# Patient Record
Sex: Male | Born: 1945
Health system: Southern US, Community
[De-identification: ages and names within clinical notes are randomized; demographics above are authoritative.]

## PROBLEM LIST (undated history)

## (undated) DIAGNOSIS — J449 Chronic obstructive pulmonary disease, unspecified: Secondary | ICD-10-CM

## (undated) DIAGNOSIS — I10 Essential (primary) hypertension: Secondary | ICD-10-CM

## (undated) DIAGNOSIS — R7989 Other specified abnormal findings of blood chemistry: Secondary | ICD-10-CM

## (undated) DIAGNOSIS — R079 Chest pain, unspecified: Secondary | ICD-10-CM

## (undated) DIAGNOSIS — J45909 Unspecified asthma, uncomplicated: Secondary | ICD-10-CM

## (undated) HISTORY — DX: Chest pain, unspecified: R07.9

## (undated) HISTORY — PX: BACK SURGERY: SHX140

## (undated) HISTORY — PX: LUNG REMOVAL, PARTIAL: SHX233

## (undated) HISTORY — PX: TONSILLECTOMY: SUR1361

---

## 1998-12-04 ENCOUNTER — Ambulatory Visit (HOSPITAL_COMMUNITY): Admission: RE | Admit: 1998-12-04 | Discharge: 1998-12-04 | Payer: Self-pay | Admitting: Family Medicine

## 2000-03-31 ENCOUNTER — Emergency Department (HOSPITAL_COMMUNITY): Admission: EM | Admit: 2000-03-31 | Discharge: 2000-03-31 | Payer: Self-pay | Admitting: Emergency Medicine

## 2002-07-29 ENCOUNTER — Ambulatory Visit (HOSPITAL_COMMUNITY): Admission: RE | Admit: 2002-07-29 | Discharge: 2002-07-29 | Payer: Self-pay | Admitting: Internal Medicine

## 2002-07-29 ENCOUNTER — Encounter: Payer: Self-pay | Admitting: Internal Medicine

## 2006-01-09 ENCOUNTER — Emergency Department (HOSPITAL_COMMUNITY): Admission: EM | Admit: 2006-01-09 | Discharge: 2006-01-09 | Payer: Self-pay | Admitting: Family Medicine

## 2006-07-04 ENCOUNTER — Ambulatory Visit: Payer: Self-pay | Admitting: Internal Medicine

## 2006-07-07 ENCOUNTER — Ambulatory Visit: Payer: Self-pay | Admitting: Internal Medicine

## 2006-07-17 ENCOUNTER — Ambulatory Visit: Payer: Self-pay | Admitting: Cardiology

## 2006-07-24 ENCOUNTER — Ambulatory Visit: Payer: Self-pay | Admitting: Internal Medicine

## 2006-08-15 ENCOUNTER — Ambulatory Visit: Payer: Self-pay | Admitting: Internal Medicine

## 2007-09-01 ENCOUNTER — Encounter: Payer: Self-pay | Admitting: *Deleted

## 2007-09-01 DIAGNOSIS — F1910 Other psychoactive substance abuse, uncomplicated: Secondary | ICD-10-CM | POA: Insufficient documentation

## 2007-09-01 DIAGNOSIS — F329 Major depressive disorder, single episode, unspecified: Secondary | ICD-10-CM

## 2007-09-01 DIAGNOSIS — F17200 Nicotine dependence, unspecified, uncomplicated: Secondary | ICD-10-CM

## 2007-09-01 DIAGNOSIS — F32A Depression, unspecified: Secondary | ICD-10-CM | POA: Insufficient documentation

## 2007-09-01 DIAGNOSIS — I10 Essential (primary) hypertension: Secondary | ICD-10-CM | POA: Insufficient documentation

## 2007-09-01 DIAGNOSIS — Z9089 Acquired absence of other organs: Secondary | ICD-10-CM | POA: Insufficient documentation

## 2007-09-01 HISTORY — DX: Depression, unspecified: F32.A

## 2007-09-01 HISTORY — DX: Nicotine dependence, unspecified, uncomplicated: F17.200

## 2016-06-13 ENCOUNTER — Emergency Department (HOSPITAL_COMMUNITY): Payer: 59

## 2016-06-13 ENCOUNTER — Encounter (HOSPITAL_COMMUNITY): Payer: Self-pay | Admitting: Emergency Medicine

## 2016-06-13 ENCOUNTER — Inpatient Hospital Stay (HOSPITAL_COMMUNITY)
Admission: EM | Disposition: A | Discharge status: Home / Self Care | Payer: Self-pay | Source: Home / Self Care | Attending: Internal Medicine

## 2016-06-13 ENCOUNTER — Inpatient Hospital Stay (HOSPITAL_COMMUNITY)
Admission: EM | Admit: 2016-06-13 | Discharge: 2016-06-16 | DRG: 286 | Disposition: A | Discharge status: Home / Self Care | Payer: 59 | Attending: Internal Medicine | Admitting: Internal Medicine

## 2016-06-13 DIAGNOSIS — I25810 Atherosclerosis of coronary artery bypass graft(s) without angina pectoris: Secondary | ICD-10-CM

## 2016-06-13 DIAGNOSIS — I214 Non-ST elevation (NSTEMI) myocardial infarction: Secondary | ICD-10-CM

## 2016-06-13 DIAGNOSIS — R079 Chest pain, unspecified: Secondary | ICD-10-CM | POA: Diagnosis not present

## 2016-06-13 DIAGNOSIS — R531 Weakness: Secondary | ICD-10-CM | POA: Diagnosis not present

## 2016-06-13 DIAGNOSIS — F1121 Opioid dependence, in remission: Secondary | ICD-10-CM | POA: Diagnosis not present

## 2016-06-13 DIAGNOSIS — I5181 Takotsubo syndrome: Principal | ICD-10-CM | POA: Diagnosis present

## 2016-06-13 DIAGNOSIS — Z79899 Other long term (current) drug therapy: Secondary | ICD-10-CM | POA: Diagnosis not present

## 2016-06-13 DIAGNOSIS — J189 Pneumonia, unspecified organism: Secondary | ICD-10-CM | POA: Diagnosis present

## 2016-06-13 DIAGNOSIS — Z8701 Personal history of pneumonia (recurrent): Secondary | ICD-10-CM

## 2016-06-13 DIAGNOSIS — I2510 Atherosclerotic heart disease of native coronary artery without angina pectoris: Secondary | ICD-10-CM

## 2016-06-13 DIAGNOSIS — I213 ST elevation (STEMI) myocardial infarction of unspecified site: Secondary | ICD-10-CM

## 2016-06-13 DIAGNOSIS — J440 Chronic obstructive pulmonary disease with acute lower respiratory infection: Secondary | ICD-10-CM | POA: Diagnosis present

## 2016-06-13 DIAGNOSIS — R778 Other specified abnormalities of plasma proteins: Secondary | ICD-10-CM

## 2016-06-13 DIAGNOSIS — F17210 Nicotine dependence, cigarettes, uncomplicated: Secondary | ICD-10-CM | POA: Diagnosis not present

## 2016-06-13 DIAGNOSIS — F1120 Opioid dependence, uncomplicated: Secondary | ICD-10-CM | POA: Diagnosis present

## 2016-06-13 DIAGNOSIS — Z8249 Family history of ischemic heart disease and other diseases of the circulatory system: Secondary | ICD-10-CM

## 2016-06-13 DIAGNOSIS — I5023 Acute on chronic systolic (congestive) heart failure: Secondary | ICD-10-CM | POA: Diagnosis present

## 2016-06-13 DIAGNOSIS — R7989 Other specified abnormal findings of blood chemistry: Secondary | ICD-10-CM | POA: Diagnosis present

## 2016-06-13 DIAGNOSIS — Z23 Encounter for immunization: Secondary | ICD-10-CM

## 2016-06-13 HISTORY — PX: CARDIAC CATHETERIZATION: SHX172

## 2016-06-13 HISTORY — DX: Other specified abnormal findings of blood chemistry: R79.89

## 2016-06-13 HISTORY — DX: Takotsubo syndrome: I51.81

## 2016-06-13 HISTORY — DX: Chronic obstructive pulmonary disease, unspecified: J44.9

## 2016-06-13 HISTORY — DX: Other specified abnormalities of plasma proteins: R77.8

## 2016-06-13 HISTORY — DX: Atherosclerosis of coronary artery bypass graft(s) without angina pectoris: I25.810

## 2016-06-13 LAB — STREP PNEUMONIAE URINARY ANTIGEN: Strep Pneumo Urinary Antigen: NEGATIVE

## 2016-06-13 LAB — MRSA PCR SCREENING: MRSA BY PCR: NEGATIVE

## 2016-06-13 LAB — BASIC METABOLIC PANEL
Anion gap: 11 (ref 5–15)
BUN: 11 mg/dL (ref 6–20)
CO2: 23 mmol/L (ref 22–32)
Calcium: 8.6 mg/dL — ABNORMAL LOW (ref 8.9–10.3)
Chloride: 102 mmol/L (ref 101–111)
Creatinine, Ser: 0.84 mg/dL (ref 0.61–1.24)
GFR calc Af Amer: >60 mL/min (ref >60)
GFR calc non Af Amer: >60 mL/min (ref >60)
Glucose, Bld: 141 mg/dL — ABNORMAL HIGH (ref 65–99)
Potassium: 3.9 mmol/L (ref 3.5–5.1)
Sodium: 136 mmol/L (ref 135–145)

## 2016-06-13 LAB — CBC WITH DIFFERENTIAL/PLATELET
Basophils Absolute: 0 10*3/uL (ref 0.0–0.1)
Basophils Relative: 0 %
Eosinophils Absolute: 0.1 10*3/uL (ref 0.0–0.7)
Eosinophils Relative: 1 %
HCT: 43.4 % (ref 39.0–52.0)
Hemoglobin: 14.2 g/dL (ref 13.0–17.0)
Lymphocytes Relative: 5 %
Lymphs Abs: 0.5 10*3/uL — ABNORMAL LOW (ref 0.7–4.0)
MCH: 30.9 pg (ref 26.0–34.0)
MCHC: 32.7 g/dL (ref 30.0–36.0)
MCV: 94.3 fL (ref 78.0–100.0)
Monocytes Absolute: 0.6 10*3/uL (ref 0.1–1.0)
Monocytes Relative: 6 %
Neutro Abs: 9.1 10*3/uL — ABNORMAL HIGH (ref 1.7–7.7)
Neutrophils Relative %: 88 %
Platelets: 217 10*3/uL (ref 150–400)
RBC: 4.6 MIL/uL (ref 4.22–5.81)
RDW: 13.8 % (ref 11.5–15.5)
WBC: 10.3 10*3/uL (ref 4.0–10.5)

## 2016-06-13 LAB — I-STAT TROPONIN, ED: TROPONIN I, POC: 0.24 ng/mL — AB (ref 0.00–0.08)

## 2016-06-13 SURGERY — LEFT HEART CATH AND CORONARY ANGIOGRAPHY
Anesthesia: LOCAL

## 2016-06-13 MED ORDER — ENOXAPARIN SODIUM 40 MG/0.4ML ~~LOC~~ SOLN
40.0000 mg | SUBCUTANEOUS | Status: DC
  Administered 2016-06-14 – 2016-06-16 (×3): 40 mg via SUBCUTANEOUS
  Filled 2016-06-13 (×3): qty 0.4

## 2016-06-13 MED ORDER — LISINOPRIL 2.5 MG PO TABS
2.5000 mg | ORAL_TABLET | Freq: Every day | ORAL | Status: DC
  Administered 2016-06-13 – 2016-06-16 (×4): 2.5 mg via ORAL
  Filled 2016-06-13 (×4): qty 1

## 2016-06-13 MED ORDER — CETYLPYRIDINIUM CHLORIDE 0.05 % MT LIQD
7.0000 mL | Freq: Two times a day (BID) | OROMUCOSAL | Status: DC
  Administered 2016-06-13 – 2016-06-14 (×3): 7 mL via OROMUCOSAL

## 2016-06-13 MED ORDER — METHADONE HCL 5 MG PO TABS
14.0000 mg | ORAL_TABLET | Freq: Every day | ORAL | Status: DC
  Administered 2016-06-13 – 2016-06-16 (×4): 15 mg via ORAL
  Filled 2016-06-13 (×4): qty 3

## 2016-06-13 MED ORDER — ATORVASTATIN CALCIUM 40 MG PO TABS
40.0000 mg | ORAL_TABLET | Freq: Every day | ORAL | Status: DC
  Administered 2016-06-13 – 2016-06-15 (×2): 40 mg via ORAL
  Filled 2016-06-13 (×2): qty 1

## 2016-06-13 MED ORDER — DEXTROSE 5 % IV SOLN
1.0000 g | Freq: Once | INTRAVENOUS | Status: AC
  Administered 2016-06-13: 1 g via INTRAVENOUS
  Filled 2016-06-13 (×2): qty 10

## 2016-06-13 MED ORDER — HEPARIN (PORCINE) IN NACL 2-0.9 UNIT/ML-% IJ SOLN
INTRAMUSCULAR | Status: DC | PRN
  Administered 2016-06-13: 1000 mL

## 2016-06-13 MED ORDER — IOPAMIDOL (ISOVUE-370) INJECTION 76%
INTRAVENOUS | Status: AC
  Filled 2016-06-13: qty 100

## 2016-06-13 MED ORDER — DEXTROSE 5 % IV SOLN
500.0000 mg | INTRAVENOUS | Status: DC
  Administered 2016-06-14: 500 mg via INTRAVENOUS
  Filled 2016-06-13 (×2): qty 500

## 2016-06-13 MED ORDER — SODIUM CHLORIDE 0.9 % IV SOLN
INTRAVENOUS | Status: DC | PRN
  Administered 2016-06-13: 75 mL/h via INTRAVENOUS

## 2016-06-13 MED ORDER — SODIUM CHLORIDE 0.9% FLUSH: 3.0000 mL | INTRAVENOUS | Status: DC | PRN

## 2016-06-13 MED ORDER — SODIUM CHLORIDE 0.9 % IV SOLN: 250.0000 mL | INTRAVENOUS | Status: DC | PRN

## 2016-06-13 MED ORDER — VERAPAMIL HCL 2.5 MG/ML IV SOLN
INTRAVENOUS | Status: AC
  Filled 2016-06-13: qty 2

## 2016-06-13 MED ORDER — VERAPAMIL HCL 2.5 MG/ML IV SOLN
INTRAVENOUS | Status: DC | PRN
  Administered 2016-06-13: 12:00:00 via INTRA_ARTERIAL

## 2016-06-13 MED ORDER — BUPROPION HCL ER (XL) 300 MG PO TB24
300.0000 mg | ORAL_TABLET | Freq: Every day | ORAL | Status: DC
  Administered 2016-06-13 – 2016-06-16 (×4): 300 mg via ORAL
  Filled 2016-06-13 (×4): qty 1

## 2016-06-13 MED ORDER — OXYCODONE-ACETAMINOPHEN 5-325 MG PO TABS
1.0000 | ORAL_TABLET | Freq: Once | ORAL | Status: AC
  Administered 2016-06-13: 1 via ORAL
  Filled 2016-06-13: qty 1

## 2016-06-13 MED ORDER — IOPAMIDOL (ISOVUE-370) INJECTION 76%
INTRAVENOUS | Status: DC | PRN
  Administered 2016-06-13: 80 mL via INTRAVENOUS

## 2016-06-13 MED ORDER — ONDANSETRON HCL 4 MG/2ML IJ SOLN: 4.0000 mg | Freq: Four times a day (QID) | INTRAMUSCULAR | Status: DC | PRN

## 2016-06-13 MED ORDER — ACETAMINOPHEN 325 MG PO TABS: 650.0000 mg | ORAL_TABLET | ORAL | Status: DC | PRN

## 2016-06-13 MED ORDER — PNEUMOCOCCAL VAC POLYVALENT 25 MCG/0.5ML IJ INJ
0.5000 mL | INJECTION | INTRAMUSCULAR | Status: AC
  Administered 2016-06-14: 0.5 mL via INTRAMUSCULAR
  Filled 2016-06-13: qty 0.5

## 2016-06-13 MED ORDER — MIDAZOLAM HCL 2 MG/2ML IJ SOLN
INTRAMUSCULAR | Status: AC
  Filled 2016-06-13: qty 2

## 2016-06-13 MED ORDER — FENTANYL CITRATE (PF) 100 MCG/2ML IJ SOLN
INTRAMUSCULAR | Status: AC
  Filled 2016-06-13: qty 2

## 2016-06-13 MED ORDER — MIDAZOLAM HCL 2 MG/2ML IJ SOLN
INTRAMUSCULAR | Status: DC | PRN
  Administered 2016-06-13: 1 mg via INTRAVENOUS

## 2016-06-13 MED ORDER — SODIUM CHLORIDE 0.9 % IV SOLN: INTRAVENOUS | Status: AC

## 2016-06-13 MED ORDER — HEPARIN SODIUM (PORCINE) 5000 UNIT/ML IJ SOLN: 4000.0000 [IU] | Freq: Once | INTRAMUSCULAR | Status: DC

## 2016-06-13 MED ORDER — LIDOCAINE HCL (PF) 1 % IJ SOLN
INTRAMUSCULAR | Status: AC
  Filled 2016-06-13: qty 30

## 2016-06-13 MED ORDER — NITROGLYCERIN 1 MG/10 ML FOR IR/CATH LAB
INTRA_ARTERIAL | Status: AC
  Filled 2016-06-13: qty 10

## 2016-06-13 MED ORDER — CITALOPRAM HYDROBROMIDE 20 MG PO TABS
40.0000 mg | ORAL_TABLET | Freq: Every day | ORAL | Status: DC
  Administered 2016-06-13 – 2016-06-16 (×4): 40 mg via ORAL
  Filled 2016-06-13 (×4): qty 2

## 2016-06-13 MED ORDER — METOPROLOL SUCCINATE ER 25 MG PO TB24
12.5000 mg | ORAL_TABLET | Freq: Every day | ORAL | Status: DC
  Administered 2016-06-13 – 2016-06-16 (×4): 12.5 mg via ORAL
  Filled 2016-06-13 (×4): qty 1

## 2016-06-13 MED ORDER — ASPIRIN 81 MG PO CHEW
81.0000 mg | CHEWABLE_TABLET | Freq: Every day | ORAL | Status: DC
  Administered 2016-06-14 – 2016-06-16 (×3): 81 mg via ORAL
  Filled 2016-06-13 (×3): qty 1

## 2016-06-13 MED ORDER — DEXTROSE 5 % IV SOLN
500.0000 mg | Freq: Once | INTRAVENOUS | Status: AC
  Administered 2016-06-13: 500 mg via INTRAVENOUS
  Filled 2016-06-13: qty 500

## 2016-06-13 MED ORDER — MORPHINE SULFATE (PF) 2 MG/ML IV SOLN
2.0000 mg | Freq: Once | INTRAVENOUS | Status: AC
  Administered 2016-06-13: 2 mg via INTRAVENOUS
  Filled 2016-06-13: qty 1

## 2016-06-13 MED ORDER — HEPARIN SODIUM (PORCINE) 5000 UNIT/ML IJ SOLN
INTRAMUSCULAR | Status: AC
  Administered 2016-06-13: 4000 [IU]
  Filled 2016-06-13: qty 1

## 2016-06-13 MED ORDER — DEXTROSE 5 % IV SOLN: 500.0000 mg | INTRAVENOUS | Status: DC

## 2016-06-13 MED ORDER — LIDOCAINE HCL (PF) 1 % IJ SOLN
INTRAMUSCULAR | Status: DC | PRN
  Administered 2016-06-13: 2 mL

## 2016-06-13 MED ORDER — FENTANYL CITRATE (PF) 100 MCG/2ML IJ SOLN
INTRAMUSCULAR | Status: DC | PRN
  Administered 2016-06-13: 50 ug via INTRAVENOUS

## 2016-06-13 MED ORDER — HEPARIN SODIUM (PORCINE) 1000 UNIT/ML IJ SOLN
INTRAMUSCULAR | Status: AC
  Filled 2016-06-13: qty 1

## 2016-06-13 MED ORDER — HEPARIN (PORCINE) IN NACL 2-0.9 UNIT/ML-% IJ SOLN
INTRAMUSCULAR | Status: AC
  Filled 2016-06-13: qty 1000

## 2016-06-13 MED ORDER — SODIUM CHLORIDE 0.9% FLUSH
3.0000 mL | Freq: Two times a day (BID) | INTRAVENOUS | Status: DC
  Administered 2016-06-13 – 2016-06-15 (×5): 3 mL via INTRAVENOUS

## 2016-06-13 MED ORDER — DEXTROSE 5 % IV SOLN: 1.0000 g | INTRAVENOUS | Status: DC

## 2016-06-13 MED ORDER — DEXTROSE 5 % IV SOLN
1.0000 g | INTRAVENOUS | Status: DC
  Administered 2016-06-14 – 2016-06-15 (×2): 1 g via INTRAVENOUS
  Filled 2016-06-13 (×3): qty 10

## 2016-06-13 MED ORDER — NITROGLYCERIN IN D5W 200-5 MCG/ML-% IV SOLN
INTRAVENOUS | Status: AC
  Filled 2016-06-13: qty 250

## 2016-06-13 MED ORDER — ALBUTEROL SULFATE (2.5 MG/3ML) 0.083% IN NEBU: 2.5000 mg | INHALATION_SOLUTION | RESPIRATORY_TRACT | Status: DC | PRN

## 2016-06-13 SURGICAL SUPPLY — 13 items
CATH INFINITI 5FR ANG PIGTAIL (CATHETERS) ×2 IMPLANT
CATH INFINITI JR4 5F (CATHETERS) ×1 IMPLANT
CATH VISTA GUIDE 6FR XB3 (CATHETERS) ×2 IMPLANT
DEVICE RAD COMP TR BAND LRG (VASCULAR PRODUCTS) ×1 IMPLANT
ELECT DEFIB PAD ADLT CADENCE (PAD) ×2 IMPLANT
GLIDESHEATH SLEND SS 6F .021 (SHEATH) ×2 IMPLANT
KIT ENCORE 26 ADVANTAGE (KITS) ×2 IMPLANT
KIT HEART LEFT (KITS) ×2 IMPLANT
PACK CARDIAC CATHETERIZATION (CUSTOM PROCEDURE TRAY) ×2 IMPLANT
SYR MEDRAD MARK V 150ML (SYRINGE) ×2 IMPLANT
TRANSDUCER W/STOPCOCK (MISCELLANEOUS) ×2 IMPLANT
TUBING CIL FLEX 10 FLL-RA (TUBING) ×2 IMPLANT
WIRE SAFE-T 1.5MM-J .035X260CM (WIRE) ×1 IMPLANT

## 2016-06-13 NOTE — Consult Note (Signed)
[name], MD Physician Signed Cardiology  H&P Date of Service: 06/13/2016 10:55 AM    Expand All Collapse All   '[]'$ Hide copied text '[]'$ Hover for attribution information    Cardiology Consult    Patient ID: Gary Wright MRN: Wright, DOB/AGE: 05-28-1946   Admit date: 06/13/2016 Date of Consult: 06/13/2016  Primary Physician: No primary care provider on file. Reason for Consult: chest pain Primary Cardiologist: New Requesting Provider: Dr. Lita Mains  Patient Profile  Gary Wright is a 70 year old male with no previous cardiac history. He has a history of COPD and narcotic abuse (on Methadone).   History of Present Illness  Gary Wright presents to the ED today with dyspnea and chest pressure. He woke up this morning around 5 am and experienced extreme dyspnea. He could not catch his breath for a long while. He also developed substernal chest pressure that radiated to both arms. He called EMS, who "checked him out". He was offered transport to the hospital but declined. He then went to the methadone clinic and continued to experience chest pressure and SOB. His wife transported him to the ED.   His EKG on arrival showed inferior ST depression and slight anterolateral elevation, however subsequent EKG's showed worsening elevation in V2-V3. He continued to have chest pain, SOB and developed diaphoresis at the time of my encounter. His initial troponin was elevated at 0.24.   He denies a history of MI. He says that he has been having intermittent chest pain for a few months that would wax and waned in severity, however he says that he has never had pain as intense as he is experiencing today.   Also of note, he is febrile on arrival with temp of 100.7 orally. He reports having a cough for 3-4 days that is productive of clear sputum although he has not coughed up anything today. He has not sought treatment for this.   Past Medical History       Past Medical History:    Diagnosis Date  . COPD (chronic obstructive pulmonary disease) ([place])          Past Surgical History:  Procedure Laterality Date  . TONSILLECTOMY       Allergies  No Known Allergies  Inpatient Medications    .  morphine injection  2 mg Intravenous Once    Family History    No family history on file.  Social History    Social History        Social History  . Marital status: Legally Separated    Spouse name: N/A  . Number of children: N/A  . Years of education: N/A      Occupational History  . Not on file.         Social History Main Topics  . Smoking status: Current Every Day Smoker    Types: Cigarettes  . Smokeless tobacco: Never Used  . Alcohol use No     Comment: No alcohol for 17 years  . Drug use: No  . Sexual activity: Not on file       Other Topics Concern  . Not on file      Social History Narrative  . No narrative on file     Review of Systems    General:  No chills, + fever, night sweats or weight changes.  Cardiovascular:  + chest pain, +dyspnea on exertion, edema, orthopnea, palpitations, paroxysmal nocturnal dyspnea. Dermatological: No rash, lesions/masses Respiratory: + cough, + dyspnea Urologic: No  hematuria, dysuria Abdominal:   No nausea, vomiting, diarrhea, bright red blood per rectum, melena, or hematemesis Neurologic:  No visual changes, wkns, changes in mental status. All other systems reviewed and are otherwise negative except as noted above.  Physical Exam    Blood pressure 141/91, pulse 95, temperature 100.7 F (38.2 C), temperature source Oral, resp. rate (!) 101, height '5\' 9"'$  (1.753 m), weight 165 lb (74.8 kg), SpO2 98 %.  General: Pleasant, slightly anxious Psych: Normal affect. Neuro: Alert and oriented X 3. Moves all extremities spontaneously. HEENT: Poor denition                       Neck: Supple without bruits or JVD. Lungs:  Resp regular and unlabored, severely diminished  throughout Heart: RRR no s3, s4, or murmurs. Abdomen: Soft, non-tender, non-distended, BS + x 4.  Extremities: No clubbing, cyanosis or edema. DP/PT/Radials 2+ and equal bilaterally.  Labs    Troponin (Point of Care Test)  Recent Labs (last 2 labs)    Recent Labs  06/13/16 0948  TROPIPOC 0.24*      Recent Labs       Lab Results  Component Value Date   WBC 10.3 06/13/2016   HGB 14.2 06/13/2016   HCT 43.4 06/13/2016   MCV 94.3 06/13/2016   PLT 217 06/13/2016      Recent Labs Lab 06/13/16 0938  NA 136  K 3.9  CL 102  CO2 23  BUN 11  CREATININE 0.84  CALCIUM 8.6*  GLUCOSE 141*    Radiology Studies     Imaging Results  Dg Chest 2 View  Result Date: 06/13/2016 CLINICAL DATA:  Shortness of breath and cough for 2 days. EXAM: CHEST  2 VIEW COMPARISON:  04/20/2008 FINDINGS: Cardiomediastinal silhouette is normal. Mediastinal contours appear intact. There is no evidence of pleural effusion or pneumothorax. Subtle peribronchovascular airspace consolidation in the right middle lobe. Osseous structures are without acute abnormality. Soft tissues are grossly normal. IMPRESSION: Subtle airspace consolidation in the right middle lobe may represent developing broncho pneumonia. Electronically Signed   By: Fidela Salisbury M.D.   On: 06/13/2016 10:20     EKG & Cardiac Imaging    EKG: NSR, ST depression in anterior leads, 1-2 mm elevation in anterolateral leads.   Echocardiogram: pending   Assessment & Plan  1. STEMI: Presented with chest pressure with SOB and diaphoresis. Denies nausea. He continues to have chest pain despite administration of Nitro. EKG with worsening ST changes as discussed above. Given his elevated troponin, worsening EKG and continued pain with associated symptoms, code STEMI was activated. He was transferred urgently to the cath lab. His cath showed normal coronary arteries with no obstruction. However, he does have Takotsubo  cardiomyopathy.  Will obtain lipid panel and A1c for risk stratification. Continue to cycle troponin.   2. Takotsubo cardiomyopathy: Will place orders for Echo.   3. Community acquired pneumonia: Chest x ray shows right middle lobe pneumonia. Will treat with azithromycin and ceftriaxone per pharmacy recommendations.    4. Methadone dependence: consulted with pharmacy who confirmed this is ok to continue while in the hospital. Will place orders to avoid withdrawal.    Signed, Arbutus Leas, NP 06/13/2016, 10:56 AM Pager: [phone] As above, patient seen and examined. Briefly he is a 70 year old male with past medical history of COPD, narcotic abuse on chronic methadone with non-ST elevation myocardial infarction. The patient developed severe substernal chest pain this morning without radiation.  Not pleuritic or positional. There was associated dyspnea. Also with fever and cough. He went to methadone clinic and was sent to the emergency room. Pain persisted on arrival. Electrocardiogram shows sinus rhythm with subtle lateral ST elevation. Initial troponin 0.23. 1 non-ST elevation myocardial infarction-patient presented with chest pain that is concerning. Electrocardiogram with question subtle lateral ST elevation. Given persistence of symptoms I have recommended emergent cardiac catheterization. The risks and benefits including stroke, myocardial infarction and death discussed and patient agrees to proceed. Treat with aspirin and heparin. Add statin. No beta-blockade given severity of COPD. 2 tobacco abuse-patient counseled on discontinuing. 3 severe COPD/possible pneumonia-I have asked the hospitalist to admit and treat his pulmonary issues. 4 methadone addiction Kirk Ruths, MD   Addendum-cath shows mild diffuse nonobstructive coronary disease and ejection fraction 25-30%. Probable Takotsubo cardiomyopathy. Treatment with ACE inhibitor and low-dose Toprol to see if he tolerates from  a pulmonary standpoint. Advanced medications as tolerated. Repeat echocardiogram in approximately 4 weeks as his LV function would be expected to improve. Kirk Ruths, MD

## 2016-06-13 NOTE — ED Triage Notes (Signed)
EMS - Patient coming from Methadone Clinic with c/o central Chest pain, no radiation and SOB.  Patient denies n/v/d.  Hx COPD.  Patient has had a congested cough "for a while".  Given '324mg'$  Aspirin, 0.4 Nitro, rating pain 7/10.  124/96, HR 100 and 20 respirations.

## 2016-06-13 NOTE — H&P (Signed)
TRH H&P   Patient Demographics:    Eliyohu Class, is a 70 y.o. male  MRN: [id number]   DOB - [birth year]  Admit Date - 06/13/2016  Outpatient Primary MD for the patient is No primary care provider on file.  Referring MD/NP/PA: Cardiology  Patient coming from: Home  Chief Complaint  Patient presents with  . Chest Pain  . Shortness of Breath      HPI:    Catlin Aycock  is a 70 y.o. male, With past medical history of opioid dependence, in methadone clinic, COPD, who presents with complaints of dyspnea and chest pressure, started in a.m., as well reports cough, productive yellow sputum, NAD patient with worsening chest pain, elevated troponin, dynamic EKG changes, STEMI called cold by ED, patient had immediate cardiac cath, significant significant for nonobstructive coronary artery disease, his x-ray significant for right middle lobe pneumonia, patient with fever 100.7, hospitalist requested to resume care in cardiology .    Review of systems:    In addition to the HPI above,  No Fever-chills, No Headache, No changes with Vision or hearing, No problems swallowing food or Liquids, And planes of chest pain Chest pain, post cough with productive sputum  No Abdominal pain, No Nausea or Vommitting, Bowel movements are regular, No Blood in stool or Urine, No dysuria, No new skin rashes or bruises, No new joints pains-aches,  No new weakness, tingling, numbness in any extremity, No recent weight gain or loss, No polyuria, polydypsia or polyphagia, No significant Mental Stressors.  A full 10 point Review of Systems was done, except as stated above, all other Review of Systems were negative.   With Past History of the following :    Past Medical History:  Diagnosis Date  . COPD (chronic obstructive pulmonary disease) ([place])   . Elevated troponin 06/13/2016      Past  Surgical History:  Procedure Laterality Date  . TONSILLECTOMY        Social History:     Social History  Substance Use Topics  . Smoking status: Current Every Day Smoker    Types: Cigarettes  . Smokeless tobacco: Never Used  . Alcohol use No     Comment: No alcohol for 17 years     Lives - At home  Mobility - independent    Family History :   Significant for hypertension   Home Medications:   Prior to Admission medications   Medication Sig Start Date End Date Taking? Authorizing Provider  albuterol (PROVENTIL HFA;VENTOLIN HFA) 108 (90 Base) MCG/ACT inhaler Inhale 2 puffs into the lungs every 6 (six) hours as needed for wheezing or shortness of breath.   Yes Historical Provider, MD  buPROPion (WELLBUTRIN XL) 300 MG 24 hr tablet Take 300 mg by mouth daily. 05/11/16  Yes Historical Provider, MD  citalopram (CELEXA) 40 MG tablet Take 40 mg by mouth daily. 06/01/16  Yes Historical Provider, MD  methadone (DOLOPHINE) 10 MG/ML solution Take 14 mg by mouth daily.   Yes Historical Provider, MD     Allergies:    No Known Allergies   Physical Exam:   Vitals  Blood pressure (!) 141/90, pulse 91, temperature 100.7 F (38.2 C), temperature source Oral, resp. rate 11, height '5\' 9"'$  (1.753 m), weight 75.6 kg (166 lb 10.7 oz), SpO2 99 %.   1. General well developed  lying in bed in NAD,    2. Normal affect and insight, Not Suicidal or Homicidal, Awake Alert, Oriented X 3.  3. No F.N deficits, ALL C.Nerves Intact, Strength 5/5 all 4 extremities, Sensation intact all 4 extremities, Plantars down going.  4. Ears and Eyes appear Normal, Conjunctivae clear, PERRLA. Moist Oral Mucosa.  5. Supple Neck, No JVD, No cervical lymphadenopathy appriciated, No Carotid Bruits.  6. Symmetrical Chest wall movement, Good air movement bilaterally, CTAB.  7. RRR, No Gallops, Rubs or Murmurs, No Parasternal Heave.  8. Positive Bowel Sounds, Abdomen Soft, No tenderness, No organomegaly  appriciated,No rebound -guarding or rigidity.  9.  No Cyanosis, Normal Skin Turgor, No Skin Rash or Bruise.  10. Good muscle tone,  joints appear normal , no effusions, Normal ROM.  11. No Palpable Lymph Nodes in Neck or Axillae     Data Review:    CBC  Recent Labs Lab 06/13/16 0938  WBC 10.3  HGB 14.2  HCT 43.4  PLT 217  MCV 94.3  MCH 30.9  MCHC 32.7  RDW 13.8  LYMPHSABS 0.5*  MONOABS 0.6  EOSABS 0.1  BASOSABS 0.0   ------------------------------------------------------------------------------------------------------------------  Chemistries   Recent Labs Lab 06/13/16 0938  NA 136  K 3.9  CL 102  CO2 23  GLUCOSE 141*  BUN 11  CREATININE 0.84  CALCIUM 8.6*   ------------------------------------------------------------------------------------------------------------------ estimated creatinine clearance is 83 mL/min (by C-G formula based on SCr of 0.84 mg/dL). ------------------------------------------------------------------------------------------------------------------ No results for input(s): TSH, T4TOTAL, T3FREE, THYROIDAB in the last 72 hours.  Invalid input(s): FREET3  Coagulation profile No results for input(s): INR, PROTIME in the last 168 hours. ------------------------------------------------------------------------------------------------------------------- No results for input(s): DDIMER in the last 72 hours. -------------------------------------------------------------------------------------------------------------------  Cardiac Enzymes No results for input(s): CKMB, TROPONINI, MYOGLOBIN in the last 168 hours.  Invalid input(s): CK ------------------------------------------------------------------------------------------------------------------ No results found for: BNP   ---------------------------------------------------------------------------------------------------------------  Urinalysis No results found for: COLORURINE,  APPEARANCEUR, LABSPEC, PHURINE, GLUCOSEU, HGBUR, BILIRUBINUR, KETONESUR, PROTEINUR, UROBILINOGEN, NITRITE, LEUKOCYTESUR  ----------------------------------------------------------------------------------------------------------------   Imaging Results:    Dg Chest 2 View  Result Date: 06/13/2016 CLINICAL DATA:  Shortness of breath and cough for 2 days. EXAM: CHEST  2 VIEW COMPARISON:  04/20/2008 FINDINGS: Cardiomediastinal silhouette is normal. Mediastinal contours appear intact. There is no evidence of pleural effusion or pneumothorax. Subtle peribronchovascular airspace consolidation in the right middle lobe. Osseous structures are without acute abnormality. Soft tissues are grossly normal. IMPRESSION: Subtle airspace consolidation in the right middle lobe may represent developing broncho pneumonia. Electronically Signed   By: Fidela Salisbury M.D.   On: 06/13/2016 10:20    My personal review of EKG: Rhythm NSR, Rate 96   /min, QTc 436 , with  Acute ST changes   Assessment & Plan:    Active Problems:   Elevated troponin   Chest pain   Stress-induced cardiomyopathy   CAP (community acquired pneumonia)   Opioid dependence ([place])   CAD (coronary artery disease) of artery bypass graft   CAP - Chest with cough, low-grade fever, chest  x-ray with evidence of pneumonia and right middle lobe, will start on IV Rocephin and azithromycin, pneumonia pathway use an admission, will follow on his trip pneumonia, levofloxacin and blood cultures and sputum cultures.  COPD - No active wheezing, continue with when necessary albuterol  Chest pain/elevated troponin - Workup per cardiology, patient had cardiac cath, significant for nonobstructive CAD.  CAD - Cardiac cath showing nonobstructive CAD, on beta blockers, ACE inhibitor, will start aspirin and statin  Stress-induced cardiomyopathy - Management per cardiology  Opioid dependence - In methadone clinic, continue methadone 14 mg oral  daily  DVT Prophylaxis   Lovenox   AM Labs Ordered, also please review Full Orders  Family Communication: Admission, patients condition and plan of care including tests being ordered have been discussed with the patient  who indicate understanding and agree with the plan and Code Status.  Code Status Full  Likely DC to  Home  Condition GUARDED   Consults called: seen by cardiology  Admission status: Inpatient   Time spent in minutes : 60 minutes   ELGERGAWY, DAWOOD M.D on 06/13/2016 at 1:23 PM  Between 7am to 7pm - Pager - [phone]. After 7pm go to [email or web address] - password [facility]  [facility] - Office  [phone]

## 2016-06-13 NOTE — ED Notes (Signed)
Pt.call complain about his chest was still hurting did a repeat ekg will show to [email or web address] to nurse Vicente Males.

## 2016-06-13 NOTE — ED Notes (Addendum)
Critical Result of trop 0.24 given to Lita Mains, MD

## 2016-06-13 NOTE — ED Notes (Signed)
Page for CODE STEMI came across at 64, paged by Kellar Westberg, NS.

## 2016-06-13 NOTE — H&P (Signed)
Cardiology Consult    Patient ID: Gary Wright MRN: [id number], DOB/AGE: 12-25-1945   Admit date: 06/13/2016 Date of Consult: 06/13/2016  Primary Physician: No primary care provider on file. Reason for Consult: chest pain Primary Cardiologist: New Requesting Provider: Dr. Lita Mains  Patient Profile  Gary Wright is a 70 year old male with no previous cardiac history. He has a history of COPD and narcotic abuse (on Methadone).   History of Present Illness  Gary Wright presents to the ED today with dyspnea and chest pressure. He woke up this morning around 5 am and experienced extreme dyspnea. He could not catch his breath for a long while. He also developed substernal chest pressure that radiated to both arms. He called EMS, who "checked him out". He was offered transport to the hospital but declined. He then went to the methadone clinic and continued to experience chest pressure and SOB. His wife transported him to the ED.   His EKG on arrival showed inferior ST depression and slight anterolateral elevation, however subsequent EKG's showed worsening elevation in V2-V3. He continued to have chest pain, SOB and developed diaphoresis at the time of my encounter. His initial troponin was elevated at 0.24.   He denies a history of MI. He says that he has been having intermittent chest pain for a few months that would wax and waned in severity, however he says that he has never had pain as intense as he is experiencing today.   Also of note, he is febrile on arrival with temp of 100.7 orally. He reports having a cough for 3-4 days that is productive of clear sputum although he has not coughed up anything today. He has not sought treatment for this.   Past Medical History   Past Medical History:  Diagnosis Date  . COPD (chronic obstructive pulmonary disease) ([place])     Past Surgical History:  Procedure Laterality Date  . TONSILLECTOMY       Allergies  No Known Allergies  Inpatient  Medications    .  morphine injection  2 mg Intravenous Once    Family History    No family history on file.  Social History    Social History   Social History  . Marital status: Legally Separated    Spouse name: N/A  . Number of children: N/A  . Years of education: N/A   Occupational History  . Not on file.   Social History Main Topics  . Smoking status: Current Every Day Smoker    Types: Cigarettes  . Smokeless tobacco: Never Used  . Alcohol use No     Comment: No alcohol for 17 years  . Drug use: No  . Sexual activity: Not on file   Other Topics Concern  . Not on file   Social History Narrative  . No narrative on file     Review of Systems    General:  No chills, + fever, night sweats or weight changes.  Cardiovascular:  + chest pain, +dyspnea on exertion, edema, orthopnea, palpitations, paroxysmal nocturnal dyspnea. Dermatological: No rash, lesions/masses Respiratory: + cough, + dyspnea Urologic: No hematuria, dysuria Abdominal:   No nausea, vomiting, diarrhea, bright red blood per rectum, melena, or hematemesis Neurologic:  No visual changes, wkns, changes in mental status. All other systems reviewed and are otherwise negative except as noted above.  Physical Exam    Blood pressure 141/91, pulse 95, temperature 100.7 F (38.2 C), temperature source Oral, resp. rate (!) 101, height  $'5\' 9"'j$  (1.753 m), weight 165 lb (74.8 kg), SpO2 98 %.  General: Pleasant, slightly anxious Psych: Normal affect. Neuro: Alert and oriented X 3. Moves all extremities spontaneously. HEENT: Poor denition  Neck: Supple without bruits or JVD. Lungs:  Resp regular and unlabored, severely diminished throughout Heart: RRR no s3, s4, or murmurs. Abdomen: Soft, non-tender, non-distended, BS + x 4.  Extremities: No clubbing, cyanosis or edema. DP/PT/Radials 2+ and equal bilaterally.  Labs    Troponin [facility] Test)  Recent Labs  06/13/16 0948  TROPIPOC 0.24*    Lab  Results  Component Value Date   WBC 10.3 06/13/2016   HGB 14.2 06/13/2016   HCT 43.4 06/13/2016   MCV 94.3 06/13/2016   PLT 217 06/13/2016    Recent Labs Lab 06/13/16 0938  NA 136  K 3.9  CL 102  CO2 23  BUN 11  CREATININE 0.84  CALCIUM 8.6*  GLUCOSE 141*    Radiology Studies    Dg Chest 2 View  Result Date: 06/13/2016 CLINICAL DATA:  Shortness of breath and cough for 2 days. EXAM: CHEST  2 VIEW COMPARISON:  04/20/2008 FINDINGS: Cardiomediastinal silhouette is normal. Mediastinal contours appear intact. There is no evidence of pleural effusion or pneumothorax. Subtle peribronchovascular airspace consolidation in the right middle lobe. Osseous structures are without acute abnormality. Soft tissues are grossly normal. IMPRESSION: Subtle airspace consolidation in the right middle lobe may represent developing broncho pneumonia. Electronically Signed   By: Fidela Salisbury M.D.   On: 06/13/2016 10:20    EKG & Cardiac Imaging    EKG: NSR, ST depression in anterior leads, 1-2 mm elevation in anterolateral leads.   Echocardiogram: pending   Assessment & Plan  1. STEMI: Presented with chest pressure with SOB and diaphoresis. Denies nausea. He continues to have chest pain despite administration of Nitro. EKG with worsening ST changes as discussed above. Given his elevated troponin, worsening EKG and continued pain with associated symptoms, code STEMI was activated. He was transferred urgently to the cath lab. His cath showed normal coronary arteries with no obstruction. However, he does have Takotsubo cardiomyopathy.  Will obtain lipid panel and A1c for risk stratification. Continue to cycle troponin.   2. Takotsubo cardiomyopathy: Will place orders for Echo.   3. Community acquired pneumonia: Chest x ray shows right middle lobe pneumonia. Will treat with azithromycin and ceftriaxone per pharmacy recommendations.    4. Methadone dependence: consulted with pharmacy who confirmed  this is ok to continue while in the hospital. Will place orders to avoid withdrawal.    Signed, Arbutus Leas, NP 06/13/2016, 10:56 AM Pager: [phone] As above, patient seen and examined. Briefly he is a 70 year old male with past medical history of COPD, narcotic abuse on chronic methadone with non-ST elevation myocardial infarction. The patient developed severe substernal chest pain this morning without radiation. Not pleuritic or positional. There was associated dyspnea. Also with fever and cough. He went to methadone clinic and was sent to the emergency room. Pain persisted on arrival. Electrocardiogram shows sinus rhythm with subtle lateral ST elevation. Initial troponin 0.23. 1 non-ST elevation myocardial infarction-patient presented with chest pain that is concerning. Electrocardiogram with question subtle lateral ST elevation. Given persistence of symptoms I have recommended emergent cardiac catheterization. The risks and benefits including stroke, myocardial infarction and death discussed and patient agrees to proceed. Treat with aspirin and heparin. Add statin. No beta-blockade given severity of COPD. 2 tobacco abuse-patient counseled on discontinuing. 3 severe COPD/possible pneumonia-I  have asked the hospitalist to admit and treat his pulmonary issues. 4 methadone addiction Kirk Ruths, MD   Addendum-cath shows mild diffuse nonobstructive coronary disease and ejection fraction 25-30%. Probable Takotsubo cardiomyopathy. Treatment with ACE inhibitor and low-dose Toprol to see if he tolerates from a pulmonary standpoint. Advanced medications as tolerated. Repeat echocardiogram in approximately 4 weeks as his LV function would be expected to improve. Kirk Ruths, MD

## 2016-06-13 NOTE — ED Provider Notes (Signed)
[facility] DEPT Provider Note   CSN: [id number] Arrival date & time: 06/13/16  0932     History   Chief Complaint Chief Complaint  Patient presents with  . Chest Pain  . Shortness of Breath    HPI Gary Wright is a 70 y.o. male.  HPI   70 year old male presents today with chest pain shortness of breath. Patient reports he was at the methadone clinic preparing to get his methadone medication when he started having anterior base chest pain and shortness of breath. Patient reports over the last 3 days he's had productive cough, worsening over the last day. He reports very vague pain that acutely worsened this morning. Patient reports a history of COPD, smoking, but reports this is over and above his normal shortness of breath. Patient is not on oxygen at home. Patient denies any significant past cardiac history. Patient does note a history of cardiac history and his grandparents. EMS noted normal oxygen saturations, aspirin and nitroglycerin given with no improvement in symptoms. Patient denies any lower extremity swelling or edema. Subjective fevers at home. Patient reports "I feel like I have pneumonia".  Past Medical History:  Diagnosis Date  . COPD (chronic obstructive pulmonary disease) ([place])   . Elevated troponin 06/13/2016    Patient Active Problem List   Diagnosis Date Noted  . Elevated troponin 06/13/2016  . Stress-induced cardiomyopathy 06/13/2016  . CAP (community acquired pneumonia) 06/13/2016  . Opioid dependence ([place]) 06/13/2016  . CAD (coronary artery disease) of artery bypass graft 06/13/2016  . Chest pain   . TOBACCO ABUSE 09/01/2007  . NARCOTIC ABUSE 09/01/2007  . DEPRESSION 09/01/2007  . HYPERTENSION 09/01/2007  . TONSILLECTOMY, HX OF 09/01/2007    Past Surgical History:  Procedure Laterality Date  . CARDIAC CATHETERIZATION N/A 06/13/2016   Procedure: Left Heart Cath and Coronary Angiography;  Surgeon: Nelva Bush, MD;  Location: [facility];  Service: Cardiovascular;  Laterality: N/A;  . TONSILLECTOMY       Home Medications    Prior to Admission medications   Medication Sig Start Date End Date Taking? Authorizing Provider  albuterol (PROVENTIL HFA;VENTOLIN HFA) 108 (90 Base) MCG/ACT inhaler Inhale 2 puffs into the lungs every 6 (six) hours as needed for wheezing or shortness of breath.   Yes Historical Provider, MD  buPROPion (WELLBUTRIN XL) 300 MG 24 hr tablet Take 300 mg by mouth daily. 05/11/16  Yes Historical Provider, MD  citalopram (CELEXA) 40 MG tablet Take 40 mg by mouth daily. 06/01/16  Yes Historical Provider, MD  methadone (DOLOPHINE) 10 MG/ML solution Take 14 mg by mouth daily.   Yes Historical Provider, MD    Family History No family history on file.  Social History Social History  Substance Use Topics  . Smoking status: Current Every Day Smoker    Types: Cigarettes  . Smokeless tobacco: Never Used  . Alcohol use No     Comment: No alcohol for 17 years     Allergies   Review of patient's allergies indicates no known allergies.   Review of Systems Review of Systems  All other systems reviewed and are negative.    Physical Exam Updated Vital Signs BP 135/81 (BP Location: Left Arm)   Pulse 93   Temp 100.7 F (38.2 C) (Oral)   Resp 13   Ht '5\' 9"'$  (1.753 m)   Wt 75.6 kg   SpO2 97%   BMI 24.61 kg/m   Physical Exam  Constitutional: He is oriented to person, place,  and time. He appears well-developed and well-nourished.  HENT:  Head: Normocephalic and atraumatic.  Eyes: Conjunctivae are normal. Pupils are equal, round, and reactive to light. Right eye exhibits no discharge. Left eye exhibits no discharge. No scleral icterus.  Neck: Normal range of motion. No JVD present. No tracheal deviation present.  Pulmonary/Chest: Effort normal. No stridor.  expiratory wheeze  left lower lobe, right lower lobe diminished  Musculoskeletal:  No lower extremity swelling or edema  Neurological: He is  alert and oriented to person, place, and time. Coordination normal.  Psychiatric: He has a normal mood and affect. His behavior is normal. Judgment and thought content normal.  Nursing note and vitals reviewed.    ED Treatments / Results  Labs (all labs ordered are listed, but only abnormal results are displayed) Labs Reviewed  CBC WITH DIFFERENTIAL/PLATELET - Abnormal; Notable for the following:       Result Value   Neutro Abs 9.1 (*)    Lymphs Abs 0.5 (*)    All other components within normal limits  BASIC METABOLIC PANEL - Abnormal; Notable for the following:    Glucose, Bld 141 (*)    Calcium 8.6 (*)    All other components within normal limits  I-STAT TROPOININ, ED - Abnormal; Notable for the following:    Troponin i, poc 0.24 (*)    All other components within normal limits  MRSA PCR SCREENING  CULTURE, BLOOD (ROUTINE X 2)  CULTURE, BLOOD (ROUTINE X 2)  CULTURE, EXPECTORATED SPUTUM-ASSESSMENT  GRAM STAIN  HIV ANTIBODY (ROUTINE TESTING)  STREP PNEUMONIAE URINARY ANTIGEN  LEGIONELLA PNEUMOPHILA SEROGP 1 UR AG    EKG  EKG Interpretation  Date/Time:  Thursday June 13 2016 09:38:27 EDT Ventricular Rate:  98 PR Interval:    QRS Duration: 85 QT Interval:  378 QTC Calculation: 483 R Axis:   -33 Text Interpretation:  Sinus rhythm Atrial premature complex Left axis deviation Abnormal R-wave progression, early transition Minimal ST elevation, anterior leads Borderline prolonged QT interval Confirmed by Lita Mains  MD, DAVID ([id number]) on 06/13/2016 10:16:39 AM       Radiology Dg Chest 2 View  Result Date: 06/13/2016 CLINICAL DATA:  Shortness of breath and cough for 2 days. EXAM: CHEST  2 VIEW COMPARISON:  04/20/2008 FINDINGS: Cardiomediastinal silhouette is normal. Mediastinal contours appear intact. There is no evidence of pleural effusion or pneumothorax. Subtle peribronchovascular airspace consolidation in the right middle lobe. Osseous structures are without acute  abnormality. Soft tissues are grossly normal. IMPRESSION: Subtle airspace consolidation in the right middle lobe may represent developing broncho pneumonia. Electronically Signed   By: Fidela Salisbury M.D.   On: 06/13/2016 10:20    Procedures Procedures (including critical care time)  Medications Ordered in ED Medications  azithromycin (ZITHROMAX) 500 mg in dextrose 5 % 250 mL IVPB (500 mg Intravenous Given 06/13/16 1523)  sodium chloride flush (NS) 0.9 % injection 3 mL (not administered)  sodium chloride flush (NS) 0.9 % injection 3 mL (not administered)  0.9 %  sodium chloride infusion (not administered)  acetaminophen (TYLENOL) tablet 650 mg (not administered)  enoxaparin (LOVENOX) injection 40 mg (not administered)  0.9 %  sodium chloride infusion ( Intravenous Canceled Entry 06/13/16 1400)  aspirin chewable tablet 81 mg (81 mg Oral Not Given 06/13/16 1230)  lisinopril (PRINIVIL,ZESTRIL) tablet 2.5 mg (2.5 mg Oral Given 06/13/16 1256)  metoprolol succinate (TOPROL-XL) 24 hr tablet 12.5 mg (12.5 mg Oral Given 06/13/16 1256)  atorvastatin (LIPITOR) tablet 40 mg (not administered)  pneumococcal 23 valent vaccine (PNU-IMMUNE) injection 0.5 mL (not administered)  buPROPion (WELLBUTRIN XL) 24 hr tablet 300 mg (300 mg Oral Given 06/13/16 1445)  citalopram (CELEXA) tablet 40 mg (40 mg Oral Given 06/13/16 1444)  methadone (DOLOPHINE) tablet 15 mg (15 mg Oral Given 06/13/16 1445)  azithromycin (ZITHROMAX) 500 mg in dextrose 5 % 250 mL IVPB (not administered)  cefTRIAXone (ROCEPHIN) 1 g in dextrose 5 % 50 mL IVPB (not administered)  antiseptic oral rinse (CPC / CETYLPYRIDINIUM CHLORIDE 0.05%) solution 7 mL (7 mLs Mouth Rinse Given 06/13/16 1515)  morphine 2 MG/ML injection 2 mg (2 mg Intravenous Given 06/13/16 1109)  cefTRIAXone (ROCEPHIN) 1 g in dextrose 5 % 50 mL IVPB (1 g Intravenous Given 06/13/16 1445)  nitroGLYCERIN 0.2 mg/mL in dextrose 5 % infusion (  Duplicate 7/82/95 6213)  heparin 5000  UNIT/ML injection (4,000 Units  Given 06/13/16 1131)  oxyCODONE-acetaminophen (PERCOCET/ROXICET) 5-325 MG per tablet 1 tablet (1 tablet Oral Given 06/13/16 1256)     Initial Impression / Assessment and Plan / ED Course  I have reviewed the triage vital signs and the nursing notes.  Pertinent labs & imaging results that were available during my care of the patient were reviewed by me and considered in my medical decision making (see chart for details).  Clinical Course     Final Clinical Impressions(s) / ED Diagnoses   Final diagnoses:  ST elevation myocardial infarction (STEMI), unspecified artery (HCC)   Labs: I-STAT troponin, CBC, BMP  Imaging: Dg Chest 2 view  Consults:  Therapeutics:  Discharge Meds:   Assessment/Plan:  70 year old male presents today with chest pain shortness of breath. Patient had preceding symptoms consistent with infectious etiology, but with acute chest pain ACS workup ordered. Patient had abnormal EKG, elevated troponin, cardiology consulted.   Repeat EKG shows rising ST segment  Cardiology to take patient to the Cath Lab followed by treatment for pneumonia.    New Prescriptions Current Discharge Medication List       Okey Regal, PA-C 06/13/16 1541    Julianne Rice, MD 06/19/16 [phone]

## 2016-06-14 ENCOUNTER — Other Ambulatory Visit (HOSPITAL_COMMUNITY): Payer: Medicare Other

## 2016-06-14 LAB — CBC
HEMATOCRIT: 43 % (ref 39.0–52.0)
Hemoglobin: 13.4 g/dL (ref 13.0–17.0)
MCH: 30 pg (ref 26.0–34.0)
MCHC: 31.2 g/dL (ref 30.0–36.0)
MCV: 96.2 fL (ref 78.0–100.0)
Platelets: 234 10*3/uL (ref 150–400)
RBC: 4.47 MIL/uL (ref 4.22–5.81)
RDW: 14 % (ref 11.5–15.5)
WBC: 7.8 10*3/uL (ref 4.0–10.5)

## 2016-06-14 LAB — EXPECTORATED SPUTUM ASSESSMENT W GRAM STAIN, RFLX TO RESP C

## 2016-06-14 LAB — BASIC METABOLIC PANEL
ANION GAP: 7 (ref 5–15)
BUN: 11 mg/dL (ref 6–20)
CALCIUM: 8.3 mg/dL — AB (ref 8.9–10.3)
CO2: 29 mmol/L (ref 22–32)
CREATININE: 0.8 mg/dL (ref 0.61–1.24)
Chloride: 99 mmol/L — ABNORMAL LOW (ref 101–111)
GFR calc Af Amer: >60 mL/min (ref >60)
GLUCOSE: 91 mg/dL (ref 65–99)
Potassium: 3.8 mmol/L (ref 3.5–5.1)
Sodium: 135 mmol/L (ref 135–145)

## 2016-06-14 LAB — TROPONIN I
Troponin I: 0.44 ng/mL (ref <0.03)
Troponin I: 0.37 ng/mL (ref <0.03)
Troponin I: 0.3 ng/mL (ref <0.03)

## 2016-06-14 LAB — HIV ANTIBODY (ROUTINE TESTING W REFLEX): HIV Screen 4th Generation wRfx: NONREACTIVE

## 2016-06-14 LAB — LIPID PANEL
CHOLESTEROL: 161 mg/dL (ref 0–200)
HDL: 57 mg/dL (ref >40)
LDL CALC: 86 mg/dL (ref 0–99)
TRIGLYCERIDES: 88 mg/dL (ref <150)
Total CHOL/HDL Ratio: 2.8 RATIO
VLDL: 18 mg/dL (ref 0–40)

## 2016-06-14 LAB — EXPECTORATED SPUTUM ASSESSMENT W REFEX TO RESP CULTURE

## 2016-06-14 MED ORDER — ORAL CARE MOUTH RINSE
15.0000 mL | Freq: Two times a day (BID) | OROMUCOSAL | Status: DC
  Administered 2016-06-15 (×2): 15 mL via OROMUCOSAL

## 2016-06-14 MED ORDER — CHLORHEXIDINE GLUCONATE 0.12 % MT SOLN
15.0000 mL | Freq: Two times a day (BID) | OROMUCOSAL | Status: DC
  Administered 2016-06-14 – 2016-06-16 (×4): 15 mL via OROMUCOSAL
  Filled 2016-06-14 (×3): qty 15

## 2016-06-14 NOTE — Progress Notes (Signed)
CRITICAL VALUE ALERT  Critical value received:  Troponin 0.44   Date of notification:  06/14/16  Time of notification:  9:37 AM  Critical value read back:yes   Nurse who received alert:  Clyda Hurdle  MD notified (1st page):  [place]  Time of first page:  9:37 AM  MD notified (2nd page):  Time of second page:  Responding MD:  Stanford Breed  Time MD responded:  9:38 AM  No orders given at this time.

## 2016-06-14 NOTE — Progress Notes (Signed)
1356 report received from  [place]  From [place] 1430 transferred in via wheelchair . Placed to bed . Marland Kitchen Settled in bed by [employer] and Butch Penny. Including ss and confirmation of monitors to CMT  1530 Pt resting comfortably in IV antibiotic infusion in progress. ToleraTING WELL.

## 2016-06-14 NOTE — Progress Notes (Signed)
PROGRESS NOTE                                                                                                                                                                                                             Patient Demographics:    Gary Wright, is a 70 y.o. male, DOB - [birth year], NFA:[id number]  Admit date - 06/13/2016   Admitting Physician Nelva Bush, MD  Outpatient Primary MD for the patient is No primary care provider on file.  LOS - 1  Chief Complaint  Patient presents with  . Chest Pain  . Shortness of Breath       Brief Narrative   70 year old male with opioid dependence, COPD, presents with chest pain, dynamic EKG changes, emergent cardiac cath revealing nonobstructive CAD, treated with medical management, found to have CAP.   Subjective:    Gary Wright today has, No headache, No chest pain, No abdominal pain - No Nausea, No new weakness tingling or numbness, Reports productive cough.   Assessment  & Plan :    Active Problems:   Elevated troponin   Chest pain   Stress-induced cardiomyopathy   CAP (community acquired pneumonia)   Opioid dependence ([place])   CAD (coronary artery disease) of artery bypass graft   CAP - presents with cough, low-grade fever, chest x-ray with evidence of pneumonia and right middle lobe,  - cont with  IV Rocephin and azithromycin. - Follow on blood cultures and sputum cultures. - strep pna Antigen negative, follow on Legionella antigen.  COPD - No active wheezing, continue with when necessary albuterol  Chest pain/elevated troponin> NSTEMI - Workup per cardiology, patient had cardiac cath, significant for nonobstructive CAD. - January the aspirin, statin, ACEI and beta blocker  CAD - Cardiac cath showing nonobstructive CAD, on beta blockers, ACE inhibitor,aspirin and statin  Stress-induced cardiomyopathy - Management per cardiology, follow on 2-D echo, will need repeat  echo in 4 weeks  Opioid dependence - In methadone clinic, continue methadone 14 mg oral daily   Code Status : Full  Family Communication  : D/W patient  Disposition Plan  : pending PT  Consults  :  cardiology  Procedures  : Cath 8/24  DVT Prophylaxis  :  Lovenox  Lab Results  Component Value Date   PLT 234 06/14/2016    Antibiotics  :  Anti-infectives    Start     Dose/Rate Route Frequency Ordered Stop   06/14/16 1400  azithromycin (ZITHROMAX) 500 mg in dextrose 5 % 250 mL IVPB     500 mg 250 mL/hr over 60 Minutes Intravenous Every 24 hours 06/13/16 1343     06/14/16 1400  cefTRIAXone (ROCEPHIN) 1 g in dextrose 5 % 50 mL IVPB     1 g 100 mL/hr over 30 Minutes Intravenous Every 24 hours 06/13/16 1343     06/14/16 1000  azithromycin (ZITHROMAX) 500 mg in dextrose 5 % 250 mL IVPB  Status:  Discontinued     500 mg 250 mL/hr over 60 Minutes Intravenous Every 24 hours 06/13/16 1318 06/13/16 1343   06/14/16 1000  cefTRIAXone (ROCEPHIN) 1 g in dextrose 5 % 50 mL IVPB  Status:  Discontinued     1 g 100 mL/hr over 30 Minutes Intravenous Every 24 hours 06/13/16 1318 06/13/16 1343   06/13/16 1045  cefTRIAXone (ROCEPHIN) 1 g in dextrose 5 % 50 mL IVPB     1 g 100 mL/hr over 30 Minutes Intravenous  Once 06/13/16 1035 06/13/16 1515   06/13/16 1045  azithromycin (ZITHROMAX) 500 mg in dextrose 5 % 250 mL IVPB     500 mg 250 mL/hr over 60 Minutes Intravenous  Once 06/13/16 1035 06/13/16 1623        Objective:   Vitals:   06/14/16 0400 06/14/16 0423 06/14/16 0800 06/14/16 0815  BP: (!) 99/56 (!) 99/56 118/66   Pulse: 63 65 70 64  Resp: '19 13 11 14  '$ Temp:  98.8 F (37.1 C) 97.6 F (36.4 C)   TempSrc:  Oral Oral   SpO2: 91% 93% (!) 88% 93%  Weight:      Height:        Wt Readings from Last 3 Encounters:  06/13/16 75.6 kg (166 lb 10.7 oz)  07/24/06 82.1 kg (181 lb)     Intake/Output Summary (Last 24 hours) at 06/14/16 1017 Last data filed at 06/13/16 1523  Gross  per 24 hour  Intake              450 ml  Output              300 ml  Net              150 ml     Physical Exam  Awake Alert, Oriented X 3, frail Supple Neck,No JVD Symmetrical Chest wall movement, Good air movement bilaterally, No wheezing RRR,No Gallops,Rubs or new Murmurs, No Parasternal Heave +ve B.Sounds, Abd Soft, No tenderness,  No Cyanosis, Clubbing or edema, No new Rash or bruise     Data Review:    CBC  Recent Labs Lab 06/13/16 0938 06/14/16 0159  WBC 10.3 7.8  HGB 14.2 13.4  HCT 43.4 43.0  PLT 217 234  MCV 94.3 96.2  MCH 30.9 30.0  MCHC 32.7 31.2  RDW 13.8 14.0  LYMPHSABS 0.5*  --   MONOABS 0.6  --   EOSABS 0.1  --   BASOSABS 0.0  --     Chemistries   Recent Labs Lab 06/13/16 0938 06/14/16 0159  NA 136 135  K 3.9 3.8  CL 102 99*  CO2 23 29  GLUCOSE 141* 91  BUN 11 11  CREATININE 0.84 0.80  CALCIUM 8.6* 8.3*   ------------------------------------------------------------------------------------------------------------------  Recent Labs  06/14/16 0159  CHOL 161  HDL 57  LDLCALC 86  TRIG 88  CHOLHDL 2.8  No results found for: HGBA1C ------------------------------------------------------------------------------------------------------------------ No results for input(s): TSH, T4TOTAL, T3FREE, THYROIDAB in the last 72 hours.  Invalid input(s): FREET3 ------------------------------------------------------------------------------------------------------------------ No results for input(s): VITAMINB12, FOLATE, FERRITIN, TIBC, IRON, RETICCTPCT in the last 72 hours.  Coagulation profile No results for input(s): INR, PROTIME in the last 168 hours.  No results for input(s): DDIMER in the last 72 hours.  Cardiac Enzymes  Recent Labs Lab 06/14/16 0751  TROPONINI 0.44*   ------------------------------------------------------------------------------------------------------------------ No results found for: BNP  Inpatient  Medications  Scheduled Meds: . antiseptic oral rinse  7 mL Mouth Rinse BID  . aspirin  81 mg Oral Daily  . atorvastatin  40 mg Oral q1800  . azithromycin  500 mg Intravenous Q24H  . buPROPion  300 mg Oral Daily  . cefTRIAXone (ROCEPHIN)  IV  1 g Intravenous Q24H  . citalopram  40 mg Oral Daily  . enoxaparin (LOVENOX) injection  40 mg Subcutaneous Q24H  . lisinopril  2.5 mg Oral Daily  . methadone  15 mg Oral Daily  . metoprolol succinate  12.5 mg Oral Daily  . pneumococcal 23 valent vaccine  0.5 mL Intramuscular Tomorrow-1000  . sodium chloride flush  3 mL Intravenous Q12H   Continuous Infusions:  PRN Meds:.sodium chloride, acetaminophen, albuterol, sodium chloride flush  Micro Results Recent Results (from the past 240 hour(s))  MRSA PCR Screening     Status: None   Collection Time: 06/13/16 12:23 PM  Result Value Ref Range Status   MRSA by PCR NEGATIVE NEGATIVE Final    Comment:        The GeneXpert MRSA Assay (FDA approved for NASAL specimens only), is one component of a comprehensive MRSA colonization surveillance program. It is not intended to diagnose MRSA infection nor to guide or monitor treatment for MRSA infections.     Radiology Reports Dg Chest 2 View  Result Date: 06/13/2016 CLINICAL DATA:  Shortness of breath and cough for 2 days. EXAM: CHEST  2 VIEW COMPARISON:  04/20/2008 FINDINGS: Cardiomediastinal silhouette is normal. Mediastinal contours appear intact. There is no evidence of pleural effusion or pneumothorax. Subtle peribronchovascular airspace consolidation in the right middle lobe. Osseous structures are without acute abnormality. Soft tissues are grossly normal. IMPRESSION: Subtle airspace consolidation in the right middle lobe may represent developing broncho pneumonia. Electronically Signed   By: Fidela Salisbury M.D.   On: 06/13/2016 10:20     Atisha Hamidi M.D on 06/14/2016 at 10:17 AM  Between 7am to 7pm - Pager - [phone]  After  7pm go to [email or web address] - password [facility]  [facility] -  Office  [phone]

## 2016-06-14 NOTE — Progress Notes (Signed)
    Subjective:  Denies CP; dyspnea improving   Objective:  Vitals:   06/13/16 2300 06/14/16 0000 06/14/16 0400 06/14/16 0423  BP: (!) 104/55 (!) 104/55 (!) 99/56 (!) 99/56  Pulse: 70 70 63 65  Resp: '20 19 19 13  '$ Temp: 98.5 F (36.9 C)   98.8 F (37.1 C)  TempSrc: Oral   Oral  SpO2: 92% 92% 91% 93%  Weight:      Height:        Intake/Output from previous day:  Intake/Output Summary (Last 24 hours) at 06/14/16 0739 Last data filed at 06/13/16 1523  Gross per 24 hour  Intake              450 ml  Output              300 ml  Net              150 ml    Physical Exam: Physical exam: Well-developed chronically ill appearing n no acute distress.  Skin is warm and dry.  HEENT is normal.  Neck is supple.  Chest with diminished BS and expiratory wheeze Cardiovascular exam is regular rate and rhythm.  Abdominal exam nontender or distended. No masses palpated. Extremities show no edema. Radial cath site with no hematoma neuro grossly intact    Lab Results: Basic Metabolic Panel:  Recent Labs  06/13/16 0938 06/14/16 0159  NA 136 135  K 3.9 3.8  CL 102 99*  CO2 23 29  GLUCOSE 141* 91  BUN 11 11  CREATININE 0.84 0.80  CALCIUM 8.6* 8.3*   CBC:  Recent Labs  06/13/16 0938 06/14/16 0159  WBC 10.3 7.8  NEUTROABS 9.1*  --   HGB 14.2 13.4  HCT 43.4 43.0  MCV 94.3 96.2  PLT 217 234     Assessment/Plan:  1 non-ST elevation myocardial infarction-patient presented with chest pain and his enzymes were abnormal. Cardiac catheterization revealed nonobstructive coronary disease and possible takotsubo CM. Will treat with aspirin, statin, low-dose ACE inhibitor and low-dose beta blocker. Await echocardiogram. Will likely need repeat echocardiogram in 4 weeks. LV function should improve if this is a stress-induced cardiomyopathy. 2 cardiomyopathy-as above. 3 severe COPD/possible pneumonia-further management per primary care. Continue antibiotics. 4 tobacco abuse-patient  counseled on discontinuing. 5 methadone addiction  Kirk Ruths 06/14/2016, 7:39 AM

## 2016-06-15 ENCOUNTER — Inpatient Hospital Stay (HOSPITAL_COMMUNITY): Payer: 59

## 2016-06-15 DIAGNOSIS — J189 Pneumonia, unspecified organism: Secondary | ICD-10-CM

## 2016-06-15 DIAGNOSIS — I5023 Acute on chronic systolic (congestive) heart failure: Secondary | ICD-10-CM

## 2016-06-15 DIAGNOSIS — F1121 Opioid dependence, in remission: Secondary | ICD-10-CM

## 2016-06-15 LAB — ECHOCARDIOGRAM COMPLETE
E/e' ratio: 6.69
EWDT: 239 ms
FS: 28 % (ref 28–44)
Height: 69 in
IV/PV OW: 1
LA ID, A-P, ES: 30 mm
LA vol index: 16.6 mL/m2
LA vol: 31 mL
LADIAMINDEX: 1.6 cm/m2
LAVOLA4C: 32.4 mL
LDCA: 4.15 cm2
LEFT ATRIUM END SYS DIAM: 30 mm
LV E/e' medial: 6.69
LV E/e'average: 6.69
LV PW d: 8.16 mm — AB (ref 0.6–1.1)
LV TDI E'MEDIAL: 6.2
LVELAT: 8.49 cm/s
LVOTD: 23 mm
MV Dec: 239
MV pk A vel: 76.2 m/s
MV pk E vel: 56.8 m/s
RV LATERAL S' VELOCITY: 10.6 cm/s
RV TAPSE: 21.3 mm
TDI e' lateral: 8.49
Weight: 2534.4 oz

## 2016-06-15 LAB — CBC
HEMATOCRIT: 44.3 % (ref 39.0–52.0)
HEMOGLOBIN: 14 g/dL (ref 13.0–17.0)
MCH: 30.4 pg (ref 26.0–34.0)
MCHC: 31.6 g/dL (ref 30.0–36.0)
MCV: 96.3 fL (ref 78.0–100.0)
Platelets: 229 10*3/uL (ref 150–400)
RBC: 4.6 MIL/uL (ref 4.22–5.81)
RDW: 14.1 % (ref 11.5–15.5)
WBC: 9.8 10*3/uL (ref 4.0–10.5)

## 2016-06-15 LAB — BASIC METABOLIC PANEL
ANION GAP: 8 (ref 5–15)
BUN: 17 mg/dL (ref 6–20)
CO2: 32 mmol/L (ref 22–32)
Calcium: 8.7 mg/dL — ABNORMAL LOW (ref 8.9–10.3)
Chloride: 100 mmol/L — ABNORMAL LOW (ref 101–111)
Creatinine, Ser: 0.86 mg/dL (ref 0.61–1.24)
GFR calc Af Amer: >60 mL/min (ref >60)
GFR calc non Af Amer: >60 mL/min (ref >60)
GLUCOSE: 87 mg/dL (ref 65–99)
POTASSIUM: 4.1 mmol/L (ref 3.5–5.1)
Sodium: 140 mmol/L (ref 135–145)

## 2016-06-15 LAB — LEGIONELLA PNEUMOPHILA SEROGP 1 UR AG: L. pneumophila Serogp 1 Ur Ag: NEGATIVE

## 2016-06-15 MED ORDER — FUROSEMIDE 40 MG PO TABS
40.0000 mg | ORAL_TABLET | Freq: Once | ORAL | Status: AC
  Administered 2016-06-15: 40 mg via ORAL
  Filled 2016-06-15: qty 1

## 2016-06-15 MED ORDER — AZITHROMYCIN 500 MG PO TABS
500.0000 mg | ORAL_TABLET | Freq: Every day | ORAL | Status: DC
  Administered 2016-06-15 – 2016-06-16 (×2): 500 mg via ORAL
  Filled 2016-06-15 (×2): qty 1

## 2016-06-15 NOTE — Progress Notes (Addendum)
    Subjective:  Denies CP; dyspnea improving, he states that he feels much better today, he would like to start walking.   Marland Kitchen aspirin  81 mg Oral Daily  . atorvastatin  40 mg Oral q1800  . azithromycin  500 mg Intravenous Q24H  . buPROPion  300 mg Oral Daily  . cefTRIAXone (ROCEPHIN)  IV  1 g Intravenous Q24H  . chlorhexidine  15 mL Mouth Rinse BID  . citalopram  40 mg Oral Daily  . enoxaparin (LOVENOX) injection  40 mg Subcutaneous Q24H  . lisinopril  2.5 mg Oral Daily  . mouth rinse  15 mL Mouth Rinse q12n4p  . methadone  15 mg Oral Daily  . metoprolol succinate  12.5 mg Oral Daily  . sodium chloride flush  3 mL Intravenous Q12H   Objective:  Vitals:   06/14/16 2020 06/15/16 0548 06/15/16 0854 06/15/16 0930  BP: (!) 118/56 (!) 144/77 (!) 119/57 128/62  Pulse: 63 65 70 60  Resp: '16  18 18  '$ Temp: 98 F (36.7 C) 98.5 F (36.9 C) 98.5 F (36.9 C) 98 F (36.7 C)  TempSrc: Oral Oral Oral Oral  SpO2: 94% 98% 92% 90%  Weight:      Height:       Intake/Output from previous day:  Intake/Output Summary (Last 24 hours) at 06/15/16 1028 Last data filed at 06/14/16 1739  Gross per 24 hour  Intake              350 ml  Output              250 ml  Net              100 ml   Physical Exam: Physical exam: Well-developed chronically ill appearing n no acute distress.  Skin is warm and dry.  HEENT is normal.  Neck is supple.  Chest with diminished BS and crackles at bases, no wheezing Cardiovascular exam is regular rate and rhythm.  Abdominal exam nontender or distended. No masses palpated. Extremities show no edema. Radial cath site with no hematoma neuro grossly intact  Lab Results: Basic Metabolic Panel:  Recent Labs  06/14/16 0159 06/15/16 0434  NA 135 140  K 3.8 4.1  CL 99* 100*  CO2 29 32  GLUCOSE 91 87  BUN 11 17  CREATININE 0.80 0.86  CALCIUM 8.3* 8.7*   CBC:  Recent Labs  06/13/16 0938 06/14/16 0159 06/15/16 0434  WBC 10.3 7.8 9.8  NEUTROABS 9.1*   --   --   HGB 14.2 13.4 14.0  HCT 43.4 43.0 44.3  MCV 94.3 96.2 96.3  PLT 217 234 229     Assessment/Plan:   1 non-ST elevation myocardial infarction-patient presented with chest pain and his enzymes were abnormal. Cardiac catheterization revealed nonobstructive coronary disease and possible takotsubo CM. Will treat with aspirin, statin, low-dose ACE inhibitor and low-dose beta blocker. Await echocardiogram. Will likely need repeat echocardiogram in 4 weeks. LV function should improve if this is a stress-induced cardiomyopathy. We will start PT & OT today. 2 acute on chronic systolic CHF, with some fluid overload, I will give lasix 40 mg today 3 cardiomyopathy-as above. I will order an echocardiogram to have a baseline echo. 4 severe COPD/possible pneumonia-further management per primary care. Continue antibiotics. 5 tobacco abuse-patient counseled on discontinuing. 6 methadone addiction  Ena Dawley 06/15/2016, 10:28 AM

## 2016-06-15 NOTE — Evaluation (Signed)
Physical Therapy Evaluation Patient Details Name: Gary Wright MRN: [id number] DOB: 31-Mar-1946 Today's Date: 06/15/2016   History of Present Illness  Pt is a 70 y/o male admitted for chest pain, SOB and pneumonia. PMH including but not limited to opioid dependency and COPD.  Clinical Impression  Pt presented supine in bed with HOB elevated, awake and willing to participate in therapy session. Prior to admission, pt reported being independent for all ADL's, ambulating independently and still working. Pt able to perform all functional mobility with supervision. Pt reported he has been ambulating around the unit with his wife when she was present earlier. PT answered all of pt's questions. No further physical therapy services needed at this time.     Follow Up Recommendations No PT follow up    Equipment Recommendations  None recommended by PT    Recommendations for Other Services       Precautions / Restrictions Precautions Precautions: None Restrictions Weight Bearing Restrictions: No      Mobility  Bed Mobility Overal bed mobility: Modified Independent             General bed mobility comments: pt performed supine to sit with HOB elevated and use of bedrails  Transfers Overall transfer level: Needs assistance Equipment used: None Transfers: Sit to/from Stand Sit to Stand: Supervision            Ambulation/Gait Ambulation/Gait assistance: Supervision Ambulation Distance (Feet): 150 Feet Assistive device: None Gait Pattern/deviations: Step-through pattern;WFL(Within Functional Limits)        Stairs            Wheelchair Mobility    Modified Rankin (Stroke Patients Only)       Balance Overall balance assessment: Needs assistance Sitting-balance support: Feet supported;No upper extremity supported Sitting balance-Leahy Scale: Fair     Standing balance support: During functional activity;No upper extremity supported Standing balance-Leahy  Scale: Fair                               Pertinent Vitals/Pain Pain Assessment: No/denies pain    Home Living Family/patient expects to be discharged to:: Private residence Living Arrangements: Spouse/significant other Available Help at Discharge: Family;Available 24 hours/day Type of Home: House Home Access: Level entry     Home Layout: One level Home Equipment: None      Prior Function Level of Independence: Independent         Comments: pt still working     Journalist, newspaper        Extremity/Trunk Assessment   Upper Extremity Assessment: Overall WFL for tasks assessed           Lower Extremity Assessment: Overall WFL for tasks assessed         Communication   Communication: No difficulties  Cognition Arousal/Alertness: Awake/alert Behavior During Therapy: WFL for tasks assessed/performed Overall Cognitive Status: Within Functional Limits for tasks assessed                      General Comments      Exercises        Assessment/Plan    PT Assessment Patent does not need any further PT services  PT Diagnosis Generalized weakness   PT Problem List    PT Treatment Interventions     PT Goals (Current goals can be found in the Care Plan section) Acute Rehab PT Goals Patient Stated Goal: return home    Frequency  Barriers to discharge        Co-evaluation               End of Session Equipment Utilized During Treatment: Gait belt Activity Tolerance: Patient tolerated treatment well Patient left: in bed;with call bell/phone within reach;Other (comment) (sitting EOB eating his dinner) Nurse Communication: Mobility status         Time: 2081-3887 PT Time Calculation (min) (ACUTE ONLY): 10 min   Charges:   PT Evaluation $PT Eval Low Complexity: 1 Procedure     PT G CodesClearnce Sorrel Shalina [place] 06/15/2016, 6:13 PM Sherie Don, [place], DPT [phone]

## 2016-06-15 NOTE — Progress Notes (Signed)
PROGRESS NOTE                                                                                                                                                                                                             Patient Demographics:    Gary Wright, is a 70 y.o. male, DOB - 01-20-1946, FXT:[id number]  Admit date - 06/13/2016   Admitting Physician Nelva Bush, MD  Outpatient Primary MD for the patient is No primary care provider on file.  LOS - 2  Chief Complaint  Patient presents with  . Chest Pain  . Shortness of Breath       Brief Narrative   70 year old male with opioid dependence, COPD, presents with chest pain, dynamic EKG changes, emergent cardiac cath revealing nonobstructive CAD, treated with medical management, found to have CAP.   Subjective:    [name] today has, No headache, No chest pain, No abdominal pain - No Nausea, No new weakness tingling or numbness, Reports productive cough, but feeling well and would like to start ambulating.   Assessment  & Plan :    Active Problems:   Elevated troponin   Chest pain   Stress-induced cardiomyopathy   CAP (community acquired pneumonia)   Opioid dependence ([place])   CAD (coronary artery disease) of artery bypass graft   CAP - presents with cough, low-grade fever, chest x-ray with evidence of pneumonia and right middle lobe  - cont with  IV Rocephin and azithromycin. - Follow on blood cultures and sputum cultures. - strep pna Antigen negative, follow on Legionella antigen.  COPD - No active wheezing, continue with when necessary albuterol  Chest pain/elevated troponin> NSTEMI - Workup per cardiology, patient had cardiac cath, significant for nonobstructive CAD. - cont the aspirin, statin, ACEI and beta blocker - discussed with cardiology Dr. Meda Coffee this AM, recommends continued observation today, will obtain TTE today  CAD - Cardiac cath showing  nonobstructive CAD, on beta blockers, ACE inhibitor,aspirin and statin  Stress-induced cardiomyopathy - Management per cardiology, follow on 2-D echo, will need repeat echo in 4 weeks  Opioid dependence - In methadone clinic, continue methadone 14 mg oral daily  Code Status : Full  Family Communication  : D/W patient  Disposition Plan  : Pending PT  Consults  :  cardiology  Procedures  : Cath 8/24  DVT Prophylaxis  :  Lovenox  Lab Results  Component Value Date   PLT 229 06/15/2016    Antibiotics  :    Anti-infectives    Start     Dose/Rate Route Frequency Ordered Stop   06/15/16 1300  azithromycin (ZITHROMAX) tablet 500 mg     500 mg Oral Daily 06/15/16 1218     06/14/16 1400  azithromycin (ZITHROMAX) 500 mg in dextrose 5 % 250 mL IVPB  Status:  Discontinued     500 mg 250 mL/hr over 60 Minutes Intravenous Every 24 hours 06/13/16 1343 06/15/16 1217   06/14/16 1400  cefTRIAXone (ROCEPHIN) 1 g in dextrose 5 % 50 mL IVPB     1 g 100 mL/hr over 30 Minutes Intravenous Every 24 hours 06/13/16 1343     06/14/16 1000  azithromycin (ZITHROMAX) 500 mg in dextrose 5 % 250 mL IVPB  Status:  Discontinued     500 mg 250 mL/hr over 60 Minutes Intravenous Every 24 hours 06/13/16 1318 06/13/16 1343   06/14/16 1000  cefTRIAXone (ROCEPHIN) 1 g in dextrose 5 % 50 mL IVPB  Status:  Discontinued     1 g 100 mL/hr over 30 Minutes Intravenous Every 24 hours 06/13/16 1318 06/13/16 1343   06/13/16 1045  cefTRIAXone (ROCEPHIN) 1 g in dextrose 5 % 50 mL IVPB     1 g 100 mL/hr over 30 Minutes Intravenous  Once 06/13/16 1035 06/13/16 1515   06/13/16 1045  azithromycin (ZITHROMAX) 500 mg in dextrose 5 % 250 mL IVPB     500 mg 250 mL/hr over 60 Minutes Intravenous  Once 06/13/16 1035 06/13/16 1623        Objective:   Vitals:   06/14/16 2020 06/15/16 0548 06/15/16 0854 06/15/16 0930  BP: (!) 118/56 (!) 144/77 (!) 119/57 128/62  Pulse: 63 65 70 60  Resp: '16  18 18  '$ Temp: 98 F (36.7 C)  98.5 F (36.9 C) 98.5 F (36.9 C) 98 F (36.7 C)  TempSrc: Oral Oral Oral Oral  SpO2: 94% 98% 92% 90%  Weight:      Height:        Wt Readings from Last 3 Encounters:  06/14/16 71.8 kg (158 lb 6.4 oz)  07/24/06 82.1 kg (181 lb)     Intake/Output Summary (Last 24 hours) at 06/15/16 1345 Last data filed at 06/15/16 0900  Gross per 24 hour  Intake              590 ml  Output              150 ml  Net              440 ml     Physical Exam  Awake Alert, Oriented X 3 Supple Neck,No JVD Symmetrical Chest wall movement, Good air movement bilaterally, No wheezing RRR,No Gallops,Rubs or new Murmurs, No Parasternal Heave +ve B.Sounds, Abd Soft, No tenderness,  No Cyanosis, Clubbing or edema, No new Rash or bruise     Data Review:    CBC  Recent Labs Lab 06/13/16 0938 06/14/16 0159 06/15/16 0434  WBC 10.3 7.8 9.8  HGB 14.2 13.4 14.0  HCT 43.4 43.0 44.3  PLT 217 234 229  MCV 94.3 96.2 96.3  MCH 30.9 30.0 30.4  MCHC 32.7 31.2 31.6  RDW 13.8 14.0 14.1  LYMPHSABS 0.5*  --   --   MONOABS 0.6  --   --   EOSABS 0.1  --   --  BASOSABS 0.0  --   --     Chemistries   Recent Labs Lab 06/13/16 0938 06/14/16 0159 06/15/16 0434  NA 136 135 140  K 3.9 3.8 4.1  CL 102 99* 100*  CO2 23 29 32  GLUCOSE 141* 91 87  BUN '11 11 17  '$ CREATININE 0.84 0.80 0.86  CALCIUM 8.6* 8.3* 8.7*   ------------------------------------------------------------------------------------------------------------------  Recent Labs  06/14/16 0159  CHOL 161  HDL 57  LDLCALC 86  TRIG 88  CHOLHDL 2.8    No results found for: HGBA1C ------------------------------------------------------------------------------------------------------------------ No results for input(s): TSH, T4TOTAL, T3FREE, THYROIDAB in the last 72 hours.  Invalid input(s): FREET3 ------------------------------------------------------------------------------------------------------------------ No results for input(s):  VITAMINB12, FOLATE, FERRITIN, TIBC, IRON, RETICCTPCT in the last 72 hours.  Coagulation profile No results for input(s): INR, PROTIME in the last 168 hours.  No results for input(s): DDIMER in the last 72 hours.  Cardiac Enzymes  Recent Labs Lab 06/14/16 0751 06/14/16 1517 06/14/16 1914  TROPONINI 0.44* 0.37* 0.30*   ------------------------------------------------------------------------------------------------------------------ No results found for: BNP  Inpatient Medications  Scheduled Meds: . aspirin  81 mg Oral Daily  . atorvastatin  40 mg Oral q1800  . azithromycin  500 mg Oral Daily  . buPROPion  300 mg Oral Daily  . cefTRIAXone (ROCEPHIN)  IV  1 g Intravenous Q24H  . chlorhexidine  15 mL Mouth Rinse BID  . citalopram  40 mg Oral Daily  . enoxaparin (LOVENOX) injection  40 mg Subcutaneous Q24H  . lisinopril  2.5 mg Oral Daily  . mouth rinse  15 mL Mouth Rinse q12n4p  . methadone  15 mg Oral Daily  . metoprolol succinate  12.5 mg Oral Daily  . sodium chloride flush  3 mL Intravenous Q12H   Continuous Infusions:  PRN Meds:.sodium chloride, acetaminophen, albuterol, sodium chloride flush  Micro Results Recent Results (from the past 240 hour(s))  MRSA PCR Screening     Status: None   Collection Time: 06/13/16 12:23 PM  Result Value Ref Range Status   MRSA by PCR NEGATIVE NEGATIVE Final    Comment:        The GeneXpert MRSA Assay (FDA approved for NASAL specimens only), is one component of a comprehensive MRSA colonization surveillance program. It is not intended to diagnose MRSA infection nor to guide or monitor treatment for MRSA infections.   Culture, blood (routine x 2) Call MD if unable to obtain prior to antibiotics being given     Status: None (Preliminary result)   Collection Time: 06/13/16  1:25 PM  Result Value Ref Range Status   Specimen Description BLOOD LEFT ARM  Final   Special Requests IN PEDIATRIC BOTTLE 1.0CC  Final   Culture NO GROWTH 1  DAY  Final   Report Status PENDING  Incomplete  Culture, blood (routine x 2) Call MD if unable to obtain prior to antibiotics being given     Status: None (Preliminary result)   Collection Time: 06/13/16  1:36 PM  Result Value Ref Range Status   Specimen Description BLOOD LEFT HAND  Final   Special Requests IN PEDIATRIC BOTTLE 2CC  Final   Culture NO GROWTH 1 DAY  Final   Report Status PENDING  Incomplete  Culture, sputum-assessment     Status: None   Collection Time: 06/14/16  2:16 PM  Result Value Ref Range Status   Specimen Description EXPECTORATED SPUTUM  Final   Special Requests NONE  Final   Sputum evaluation   Final  THIS SPECIMEN IS ACCEPTABLE. RESPIRATORY CULTURE REPORT TO FOLLOW.   Report Status 06/14/2016 FINAL  Final  Culture, respiratory (NON-Expectorated)     Status: None (Preliminary result)   Collection Time: 06/14/16  2:16 PM  Result Value Ref Range Status   Specimen Description EXPECTORATED SPUTUM  Final   Special Requests NONE  Final   Gram Stain   Final    ABUNDANT WBC PRESENT, PREDOMINANTLY PMN RARE SQUAMOUS EPITHELIAL CELLS PRESENT FEW GRAM VARIABLE ROD RARE GRAM POSITIVE COCCI IN PAIRS    Culture TOO YOUNG TO READ  Final   Report Status PENDING  Incomplete    Radiology Reports Dg Chest 2 View  Result Date: 06/13/2016 CLINICAL DATA:  Shortness of breath and cough for 2 days. EXAM: CHEST  2 VIEW COMPARISON:  04/20/2008 FINDINGS: Cardiomediastinal silhouette is normal. Mediastinal contours appear intact. There is no evidence of pleural effusion or pneumothorax. Subtle peribronchovascular airspace consolidation in the right middle lobe. Osseous structures are without acute abnormality. Soft tissues are grossly normal. IMPRESSION: Subtle airspace consolidation in the right middle lobe may represent developing broncho pneumonia. Electronically Signed   By: Fidela Salisbury M.D.   On: 06/13/2016 10:20     Dace Denn Marry Guan M.D on 06/15/2016 at 1:45  PM  Between 7am to 7pm - Pager - [phone]  After 7pm go to [email or web address] - password [email or web address]  [facility] -  Office  [phone]

## 2016-06-15 NOTE — Progress Notes (Signed)
  Echocardiogram 2D Echocardiogram has been performed.  Gary Wright 06/15/2016, 2:31 PM

## 2016-06-16 LAB — CULTURE, RESPIRATORY: CULTURE: NORMAL

## 2016-06-16 LAB — CULTURE, RESPIRATORY W GRAM STAIN

## 2016-06-16 MED ORDER — LISINOPRIL 2.5 MG PO TABS: 2.5000 mg | ORAL_TABLET | Freq: Every day | ORAL | 0 refills | Status: DC

## 2016-06-16 MED ORDER — ASPIRIN 81 MG PO CHEW: 81.0000 mg | CHEWABLE_TABLET | Freq: Every day | ORAL | 0 refills | Status: DC

## 2016-06-16 MED ORDER — METOPROLOL SUCCINATE ER 25 MG PO TB24: 12.5000 mg | ORAL_TABLET | Freq: Every day | ORAL | 0 refills | Status: DC

## 2016-06-16 MED ORDER — ATORVASTATIN CALCIUM 40 MG PO TABS: 40.0000 mg | ORAL_TABLET | Freq: Every day | ORAL | 0 refills | Status: DC

## 2016-06-16 MED ORDER — AZITHROMYCIN 500 MG PO TABS: 500.0000 mg | ORAL_TABLET | Freq: Every day | ORAL | 0 refills | Status: DC

## 2016-06-16 NOTE — Progress Notes (Signed)
Patient is discharge to home accompanied by patient's family member and NT via wheelchair. Discharge instructions given . Patient verbalizes understanding. All personal belongings given. Telemetry box and IV removed prior to discharge and site in good condition.

## 2016-06-16 NOTE — Discharge Summary (Signed)
Discharge Summary  Gary Wright NIO:[id number] DOB: 1946-08-09  PCP: No primary care provider on file.  Admit date: 06/13/2016 Discharge date: 06/16/2016   Recommendations for Outpatient Follow-up:  1. PCP 2-4 weeks 2. Cardiology [place] 2 weeks   Discharge Diagnoses:  Active Hospital Problems   Diagnosis Date Noted  . Elevated troponin 06/13/2016  . Stress-induced cardiomyopathy 06/13/2016  . CAP (community acquired pneumonia) 06/13/2016  . Opioid dependence ([place]) 06/13/2016  . CAD (coronary artery disease) of artery bypass graft 06/13/2016  . Chest pain     Resolved Hospital Problems   Diagnosis Date Noted Date Resolved  No resolved problems to display.    Discharge Condition: Stable   Diet recommendation: Heart healthy   Vitals:   06/16/16 0517 06/16/16 0923  BP: (!) 142/75 124/60  Pulse: (!) 59 69  Resp: 17 18  Temp: 98.1 F (36.7 C) 98.2 F (36.8 C)    History of present illness:  70 year old male with opioid dependence, COPD, presents with chest pain, dynamic EKG changes, emergent cardiac cath revealing nonobstructive CAD, treated with medical management, found to have CAP.   Hospital Course:  Active Problems:   Elevated troponin   Chest pain   Stress-induced cardiomyopathy   CAP (community acquired pneumonia)   Opioid dependence ([place])   CAD (coronary artery disease) of artery bypass graft  CAP - presented with cough, low-grade fever, chest x-ray with evidence of pneumonia and right middle lobe  - treated with  IV Rocephin and azithromycin. - Follow on blood cultures and sputum cultures. - PO azithromycin to complete 5 day course at discharge today  COPD - No active wheezing, continue with when necessary albuterol  Chest pain/elevated troponin> NSTEMI - Workup per cardiology, patient had cardiac cath, significant for nonobstructive CAD. - cont the aspirin, statin, ACEI and beta blocker - TTE unremarkable, per cardiology okay for dc home  today  CAD - Cardiac cath showing nonobstructive CAD, on beta blockers, ACE inhibitor,aspirin and statin  Stress-induced cardiomyopathy - Management per cardiology, will need repeat echo in 4 weeks  Opioid dependence - In methadone clinic, continue methadone '14mg'$  oral daily  Procedures:  Cardiac cath  Echo   Consultations:  Cardiology   Discharge Exam: BP 124/60 (BP Location: Left Arm)   Pulse 69   Temp 98.2 F (36.8 C)   Resp 18   Ht '5\' 9"'$  (1.753 m)   Wt 71.7 kg (158 lb) Comment: a scale  SpO2 92%   BMI 23.33 kg/m  General:  Alert, oriented, calm, in no acute distress  Eyes: pupils round and reactive to light and accomodation, clear sclerea Neck: supple, no masses, trachea mildline  Cardiovascular: RRR, no murmurs or rubs, no peripheral edema  Respiratory: clear to auscultation bilaterally, no wheezes, no crackles  Abdomen: soft, nontender, nondistended, normal bowel tones heard  Skin: dry, no rashes  Musculoskeletal: no joint effusions, normal range of motion  Psychiatric: appropriate affect, normal speech  Neurologic: extraocular muscles intact, clear speech, moving all extremities with intact sensorium    Discharge Instructions You were cared for by a hospitalist during your hospital stay. If you have any questions about your discharge medications or the care you received while you were in the hospital after you are discharged, you can call the unit and asked to speak with the hospitalist on call if the hospitalist that took care of you is not available. Once you are discharged, your primary care physician will handle any further medical issues. Please note that NO  REFILLS for any discharge medications will be authorized once you are discharged, as it is imperative that you return to your primary care physician (or establish a relationship with a primary care physician if you do not have one) for your aftercare needs so that they can reassess your need for  medications and monitor your lab values.  Discharge Instructions    Diet - low sodium heart healthy    Complete by:  As directed   Increase activity slowly    Complete by:  As directed       Medication List    TAKE these medications   albuterol 108 (90 Base) MCG/ACT inhaler Commonly known as:  PROVENTIL HFA;VENTOLIN HFA Inhale 2 puffs into the lungs every 6 (six) hours as needed for wheezing or shortness of breath.   aspirin 81 MG chewable tablet Chew 1 tablet (81 mg total) by mouth daily.   atorvastatin 40 MG tablet Commonly known as:  LIPITOR Take 1 tablet (40 mg total) by mouth daily at 6 PM.   azithromycin 500 MG tablet Commonly known as:  ZITHROMAX Take 1 tablet (500 mg total) by mouth daily.   buPROPion 300 MG 24 hr tablet Commonly known as:  WELLBUTRIN XL Take 300 mg by mouth daily.   citalopram 40 MG tablet Commonly known as:  CELEXA Take 40 mg by mouth daily.   lisinopril 2.5 MG tablet Commonly known as:  PRINIVIL,ZESTRIL Take 1 tablet (2.5 mg total) by mouth daily.   methadone 10 MG/ML solution Commonly known as:  DOLOPHINE Take 14 mg by mouth daily.   metoprolol succinate 25 MG 24 hr tablet Commonly known as:  TOPROL-XL Take 0.5 tablets (12.5 mg total) by mouth daily.      No Known Allergies Follow-up Information    Gary Ruths, MD Follow up in 2 week(s).   Specialty:  Cardiology Contact information: [address] [place] [address] [phone]            The results of significant diagnostics from this hospitalization (including imaging, microbiology, ancillary and laboratory) are listed below for reference.    Significant Diagnostic Studies: Dg Chest 2 View  Result Date: 06/13/2016 CLINICAL DATA:  Shortness of breath and cough for 2 days. EXAM: CHEST  2 VIEW COMPARISON:  04/20/2008 FINDINGS: Cardiomediastinal silhouette is normal. Mediastinal contours appear intact. There is no evidence of pleural effusion or  pneumothorax. Subtle peribronchovascular airspace consolidation in the right middle lobe. Osseous structures are without acute abnormality. Soft tissues are grossly normal. IMPRESSION: Subtle airspace consolidation in the right middle lobe may represent developing broncho pneumonia. Electronically Signed   By: Fidela Salisbury M.D.   On: 06/13/2016 10:20    Microbiology: Recent Results (from the past 240 hour(s))  MRSA PCR Screening     Status: None   Collection Time: 06/13/16 12:23 PM  Result Value Ref Range Status   MRSA by PCR NEGATIVE NEGATIVE Final    Comment:        The GeneXpert MRSA Assay (FDA approved for NASAL specimens only), is one component of a comprehensive MRSA colonization surveillance program. It is not intended to diagnose MRSA infection nor to guide or monitor treatment for MRSA infections.   Culture, blood (routine x 2) Call MD if unable to obtain prior to antibiotics being given     Status: None (Preliminary result)   Collection Time: 06/13/16  1:25 PM  Result Value Ref Range Status   Specimen Description BLOOD LEFT ARM  Final  Special Requests IN PEDIATRIC BOTTLE 1.0CC  Final   Culture NO GROWTH 3 DAYS  Final   Report Status PENDING  Incomplete  Culture, blood (routine x 2) Call MD if unable to obtain prior to antibiotics being given     Status: None (Preliminary result)   Collection Time: 06/13/16  1:36 PM  Result Value Ref Range Status   Specimen Description BLOOD LEFT HAND  Final   Special Requests IN PEDIATRIC BOTTLE 2CC  Final   Culture NO GROWTH 3 DAYS  Final   Report Status PENDING  Incomplete  Culture, sputum-assessment     Status: None   Collection Time: 06/14/16  2:16 PM  Result Value Ref Range Status   Specimen Description EXPECTORATED SPUTUM  Final   Special Requests NONE  Final   Sputum evaluation   Final    THIS SPECIMEN IS ACCEPTABLE. RESPIRATORY CULTURE REPORT TO FOLLOW.   Report Status 06/14/2016 FINAL  Final  Culture, respiratory  (NON-Expectorated)     Status: None (Preliminary result)   Collection Time: 06/14/16  2:16 PM  Result Value Ref Range Status   Specimen Description EXPECTORATED SPUTUM  Final   Special Requests NONE  Final   Gram Stain   Final    ABUNDANT WBC PRESENT, PREDOMINANTLY PMN RARE SQUAMOUS EPITHELIAL CELLS PRESENT FEW GRAM VARIABLE ROD RARE GRAM POSITIVE COCCI IN PAIRS    Culture TOO YOUNG TO READ  Final   Report Status PENDING  Incomplete     Labs: Basic Metabolic Panel:  Recent Labs Lab 06/13/16 0938 06/14/16 0159 06/15/16 0434  NA 136 135 140  K 3.9 3.8 4.1  CL 102 99* 100*  CO2 23 29 32  GLUCOSE 141* 91 87  BUN '11 11 17  '$ CREATININE 0.84 0.80 0.86  CALCIUM 8.6* 8.3* 8.7*   Liver Function Tests: No results for input(s): AST, ALT, ALKPHOS, BILITOT, PROT, ALBUMIN in the last 168 hours. No results for input(s): LIPASE, AMYLASE in the last 168 hours. No results for input(s): AMMONIA in the last 168 hours. CBC:  Recent Labs Lab 06/13/16 0938 06/14/16 0159 06/15/16 0434  WBC 10.3 7.8 9.8  NEUTROABS 9.1*  --   --   HGB 14.2 13.4 14.0  HCT 43.4 43.0 44.3  MCV 94.3 96.2 96.3  PLT 217 234 229   Cardiac Enzymes:  Recent Labs Lab 06/14/16 0751 06/14/16 1517 06/14/16 1914  TROPONINI 0.44* 0.37* 0.30*   BNP: BNP (last 3 results) No results for input(s): BNP in the last 8760 hours.  ProBNP (last 3 results) No results for input(s): PROBNP in the last 8760 hours.  CBG: No results for input(s): GLUCAP in the last 168 hours.  Time spent: 32 minutes were spent in preparing this discharge including medication reconciliation, counseling, and coordination of care.  Signed:  Mir Marry Guan  [facility] 06/16/2016, 12:24 PM

## 2016-06-16 NOTE — Progress Notes (Signed)
Patient Name: Gary Wright Date of Encounter: 06/16/2016  Hospital Problem List     Active Problems:   Elevated troponin   Chest pain   Stress-induced cardiomyopathy   CAP (community acquired pneumonia)   Opioid dependence ([place])   CAD (coronary artery disease) of artery bypass graft    Patient Profile     The patient was admitted with chest pain and SOB.  EKG with ST depression and code STEMI activated.  He had normal coronaries and was found to have an echo consistent with Takotsubo.  He is also managed for CAP.    Subjective   No chest pain.  No SOB.    Inpatient Medications    . aspirin  81 mg Oral Daily  . atorvastatin  40 mg Oral q1800  . azithromycin  500 mg Oral Daily  . buPROPion  300 mg Oral Daily  . cefTRIAXone (ROCEPHIN)  IV  1 g Intravenous Q24H  . chlorhexidine  15 mL Mouth Rinse BID  . citalopram  40 mg Oral Daily  . enoxaparin (LOVENOX) injection  40 mg Subcutaneous Q24H  . lisinopril  2.5 mg Oral Daily  . mouth rinse  15 mL Mouth Rinse q12n4p  . methadone  15 mg Oral Daily  . metoprolol succinate  12.5 mg Oral Daily  . sodium chloride flush  3 mL Intravenous Q12H    Vital Signs    Vitals:   06/15/16 1230 06/15/16 2021 06/16/16 0517 06/16/16 0923  BP: (!) 117/104 123/61 (!) 142/75 124/60  Pulse: 70 64 (!) 59 69  Resp: '18 18 17 18  '$ Temp: 98.4 F (36.9 C) 99.2 F (37.3 C) 98.1 F (36.7 C) 98.2 F (36.8 C)  TempSrc: Oral Oral Oral   SpO2: 96% 92% 95% 92%  Weight:   158 lb (71.7 kg)   Height:        Intake/Output Summary (Last 24 hours) at 06/16/16 1121 Last data filed at 06/16/16 0700  Gross per 24 hour  Intake              820 ml  Output              300 ml  Net              520 ml   Filed Weights   06/13/16 1225 06/14/16 1504 06/16/16 0517  Weight: 166 lb 10.7 oz (75.6 kg) 158 lb 6.4 oz (71.8 kg) 158 lb (71.7 kg)    Physical Exam    GEN: Well nourished, well developed, in no acute distress.  HEENT: normal.  Neck: Supple, no  JVD, carotid bruits, or masses. Cardiac: RRR, no murmurs, rubs, or gallops. No clubbing, cyanosis, edema.  Radials/DP/PT 2+ and equal bilaterally.  Respiratory:  Respirations regular and unlabored, clear to auscultation bilaterally. GI: Soft, nontender, nondistended, BS + x 4. MS: no deformity or atrophy. Skin: warm and dry, no rash. Neuro:  Strength and sensation are intact. Psych: Normal affect.  Labs    CBC  Recent Labs  06/14/16 0159 06/15/16 0434  WBC 7.8 9.8  HGB 13.4 14.0  HCT 43.0 44.3  MCV 96.2 96.3  PLT 234 333   Basic Metabolic Panel  Recent Labs  06/14/16 0159 06/15/16 0434  NA 135 140  K 3.8 4.1  CL 99* 100*  CO2 29 32  GLUCOSE 91 87  BUN 11 17  CREATININE 0.80 0.86  CALCIUM 8.3* 8.7*   Liver Function Tests No results for input(s): AST, ALT,  ALKPHOS, BILITOT, PROT, ALBUMIN in the last 72 hours. No results for input(s): LIPASE, AMYLASE in the last 72 hours. Cardiac Enzymes  Recent Labs  06/14/16 0751 06/14/16 1517 06/14/16 1914  TROPONINI 0.44* 0.37* 0.30*   BNP Invalid input(s): POCBNP D-Dimer No results for input(s): DDIMER in the last 72 hours. Hemoglobin A1C No results for input(s): HGBA1C in the last 72 hours. Fasting Lipid Panel  Recent Labs  06/14/16 0159  CHOL 161  HDL 57  LDLCALC 86  TRIG 88  CHOLHDL 2.8   Thyroid Function Tests No results for input(s): TSH, T4TOTAL, T3FREE, THYROIDAB in the last 72 hours.  Invalid input(s): FREET3  Telemetry    NSR  ECG    NA  Radiology    Dg Chest 2 View  Result Date: 06/13/2016 CLINICAL DATA:  Shortness of breath and cough for 2 days. EXAM: CHEST  2 VIEW COMPARISON:  04/20/2008 FINDINGS: Cardiomediastinal silhouette is normal. Mediastinal contours appear intact. There is no evidence of pleural effusion or pneumothorax. Subtle peribronchovascular airspace consolidation in the right middle lobe. Osseous structures are without acute abnormality. Soft tissues are grossly normal.  IMPRESSION: Subtle airspace consolidation in the right middle lobe may represent developing broncho pneumonia. Electronically Signed   By: Fidela Salisbury M.D.   On: 06/13/2016 10:20    Assessment & Plan    Takotsubo cardiomyopathy:  Echo EF 50 - 55% with no wall motion abnormalities.   Continue low dose beta blocker and lisinopril.    We will arrange follow up with Dr. Stanford Breed.  OK to discharge from my standpoint.   Signed, Minus Breeding, MD  06/16/2016, 11:21 AM

## 2016-06-17 MED FILL — Nitroglycerin IV Soln 100 MCG/ML in D5W: INTRA_ARTERIAL | Qty: 10 | Status: AC

## 2016-06-18 LAB — CULTURE, BLOOD (ROUTINE X 2)
CULTURE: NO GROWTH
CULTURE: NO GROWTH

## 2016-06-28 ENCOUNTER — Encounter: Payer: Self-pay | Admitting: *Deleted

## 2016-06-30 NOTE — Progress Notes (Signed)
Cardiology Office Note    Date:  07/01/2016   ID:  Gary Wright, DOB [birth year], MRN [id number]  PCP:  Pcp Not In System  Cardiologist:  Dr. Stanford Breed  Chief Complaint  Patient presents with  . Hospitalization Follow-up    seen for Dr. Stanford Breed    History of Present Illness:  Gary Wright is a 70 y.o. male past medical history of COPD, tobacco abuse, narcotic abuse on methadone who recently presented to the hospital on 06/13/2016 after he woke up that morning experiencing extreme dyspnea. He also developed substernal chest pressure. Initial EKG showed inferior ST depression and the slight anterolateral elevation however subsequent EKG showed worsening elevation in V2 and V3. Initial troponin was mildly elevated at 0.24. Initial temperature on arrival was 100.7 orally. He underwent cardiac catheterization emergently which showed mild diffuse nonobstructive CAD with 20% left main stenosis, 30% mid LAD stenosis, otherwise no significant culprit lesion, ejection fraction however is down to 25-30%. Clinical scenario consistent with acute Takotsubo cardiomyopathy. He was started on lisinopril and Toprol XL. Follow-up echocardiogram obtained on 06/15/2016 however showed ejection fraction has improved back to normal 50-55% without further wall motion abnormality. He was discharged on the following day. He was initiated on Lasix during this hospitalization however this was discontinued after echo showed normal ejection fraction.  Patient has been doing well since discharge, he does have some fatigue, however no chest pain or shortness breath. Otherwise he has been doing well without any lower extremity edema, orthopnea or PND episodes. His lipid panel obtained on 06/14/2016 showed cholesterol 161, triglyceride 88, HDL 57, LDL 86. Given his minimal CAD, he was started on moderate dose Lipitor. I will check a LFT on follow-up. I think he is doing very well, given the initiation of lisinopril, I will check  a basic metabolic panel to assess electrolyte and renal function. He is currently following up with a pulmonologist for his severe COPD. He also see Frankey Shown of [facility] for primary care. I will forward my note to his PCP. Overall I think he is doing very well. He says he is in the process of coarsely smoking and he understand the cardiovascular risks associated with smoking. He is about to start on the tobacco cessation classes at [facility].   Past Medical History:  Diagnosis Date  . COPD (chronic obstructive pulmonary disease) ([place])   . Elevated troponin 06/13/2016    Past Surgical History:  Procedure Laterality Date  . CARDIAC CATHETERIZATION N/A 06/13/2016   Procedure: Left Heart Cath and Coronary Angiography;  Surgeon: Nelva Bush, MD;  Location: [place] [facility];  Service: Cardiovascular;  Laterality: N/A;  . TONSILLECTOMY      Current Medications: Outpatient Medications Prior to Visit  Medication Sig Dispense Refill  . albuterol (PROVENTIL HFA;VENTOLIN HFA) 108 (90 Base) MCG/ACT inhaler Inhale 2 puffs into the lungs every 6 (six) hours as needed for wheezing or shortness of breath.    Marland Kitchen aspirin 81 MG chewable tablet Chew 1 tablet (81 mg total) by mouth daily. 30 tablet 0  . atorvastatin (LIPITOR) 40 MG tablet Take 1 tablet (40 mg total) by mouth daily at 6 PM. 30 tablet 0  . buPROPion (WELLBUTRIN XL) 300 MG 24 hr tablet Take 300 mg by mouth daily.  1  . citalopram (CELEXA) 40 MG tablet Take 40 mg by mouth daily.  0  . lisinopril (PRINIVIL,ZESTRIL) 2.5 MG tablet Take 1 tablet (2.5 mg total) by mouth daily. 30 tablet 0  .  methadone (DOLOPHINE) 10 MG/ML solution Take 14 mg by mouth daily.    . metoprolol succinate (TOPROL-XL) 25 MG 24 hr tablet Take 0.5 tablets (12.5 mg total) by mouth daily. 60 tablet 0  . azithromycin (ZITHROMAX) 500 MG tablet Take 1 tablet (500 mg total) by mouth daily. (Patient not taking: Reported on 07/01/2016) 3 tablet 0   No  facility-administered medications prior to visit.      Allergies:   Review of patient's allergies indicates no known allergies.   Social History   Social History  . Marital status: Legally Separated    Spouse name: N/A  . Number of children: N/A  . Years of education: N/A   Social History Main Topics  . Smoking status: Current Every Day Smoker    Types: Cigarettes  . Smokeless tobacco: Never Used  . Alcohol use No     Comment: No alcohol for 17 years  . Drug use: No  . Sexual activity: Not Asked   Other Topics Concern  . None   Social History Narrative  . None     Family History:  The patient's family history includes Hypertension in his father and mother.   ROS:   Please see the history of present illness.    ROS All other systems reviewed and are negative.   PHYSICAL EXAM:   VS:  BP (!) 144/82   Pulse 77   Ht '5\' 9"'$  (1.753 m)   Wt 166 lb 6.4 oz (75.5 kg)   BMI 24.57 kg/m    GEN: Well nourished, well developed, in no acute distress  HEENT: normal  Neck: no JVD, carotid bruits, or masses Cardiac: RRR; no murmurs, rubs, or gallops,no edema  Respiratory:  clear to auscultation bilaterally, normal work of breathing GI: soft, nontender, nondistended, + BS MS: no deformity or atrophy  Skin: warm and dry, no rash Neuro:  Alert and Oriented x 3, Strength and sensation are intact Psych: euthymic mood, full affect  Wt Readings from Last 3 Encounters:  07/01/16 166 lb 6.4 oz (75.5 kg)  06/16/16 158 lb (71.7 kg)  07/24/06 181 lb (82.1 kg)      Studies/Labs Reviewed:   EKG:  EKG is not ordered today.    Recent Labs: 06/15/2016: BUN 17; Creatinine, Ser 0.86; Hemoglobin 14.0; Platelets 229; Potassium 4.1; Sodium 140   Lipid Panel    Component Value Date/Time   CHOL 161 06/14/2016 0159   TRIG 88 06/14/2016 0159   HDL 57 06/14/2016 0159   CHOLHDL 2.8 06/14/2016 0159   VLDL 18 06/14/2016 0159   LDLCALC 86 06/14/2016 0159    Additional studies/ records that  were reviewed today include:   Cath 06/13/2016 Conclusion   1. Mild diffuse nonobstructive CAD 2. Severe segmental LV dysfunction with LVEF estimated at 25-30%  Clinical scenario consistent with Acute Takotsubo Syndrome.  Will initiate medical therapy with careful addition of lisinopril and metoprolol succinate, to be uptitrated as blood pressure, heart rate, and breathing allow.     Echo 06/15/2016 LV EF: 50% -   55%  ------------------------------------------------------------------- Indications:      Chest pain 786.51.  ------------------------------------------------------------------- History:   PMH:   Coronary artery disease.  Risk factors:  Elevated troponin. Chest pain. Current tobacco use.  ------------------------------------------------------------------- Study Conclusions  - Left ventricle: The cavity size was normal. Systolic function was   normal. The estimated ejection fraction was in the range of 50%   to 55%. Wall motion was normal; there were no regional wall  motion abnormalities. - Right ventricle: The cavity size was dilated.   ASSESSMENT:    1. Takotsubo cardiomyopathy   2. Tobacco abuse   3. Chronic bronchitis, unspecified chronic bronchitis type ([place])   4. Medication management      PLAN:  In order of problems listed above:  1. Takotsubo cardiomyopathy: Initially presented to the hospital on 8/24 as potential STEMI, however cardiac cath showed minimal CAD with EF 25-35% consistent with stress-induced cardiomyopathy. Fortunately his echocardiogram 2 days later showed ejection fraction has returned back to normal. He has no heart failure symptom on follow-up. Other than mild fatigue, he has no other cardiac symptoms. I think he is doing very well from cardiology perspective. Given additional fluid lisinopril, I will check basic metabolic panel today to evaluate his renal function and electrolyte. Her blood pressure is mildly elevated today,  however he has not taken any blood pressure medication this morning.  2.  COPD: No obvious acute exacerbation based on physical exam, currently followed by pulmonologist.  3. Tobacco abuse: He is ready to quit smoking, he has enrolled in tobacco cessation classes at [facility]   Medication Adjustments/Labs and Tests Ordered: Current medicines are reviewed at length with the patient today.  Concerns regarding medicines are outlined above.  Medication changes, Labs and Tests ordered today are listed in the Patient Instructions below. Patient Instructions  Medication Instructions:  Your physician recommends that you continue on your current medications as directed. Please refer to the Current Medication list given to you today.  Labwork: Your physician recommends that you return for lab work TODAY: BMET.  Testing/Procedures: None Ordered  Follow-Up: Your physician recommends that you schedule a follow-up appointment in: 3 MONTHS with DR CRENSHAW with LFT after visit.  Any Other Special Instructions Will Be Listed Below (If Applicable). NEXT APPT:  DATE:_________________________________________________________  TIME:_________________________________________AM/ PM   If you need a refill on your cardiac medications before your next appointment, please call your pharmacy.     Hilbert Corrigan, [place]  07/01/2016 9:40 AM    [address], [place], [place]  [address] Phone: [phone]; Fax: [phone]

## 2016-07-01 ENCOUNTER — Ambulatory Visit (INDEPENDENT_AMBULATORY_CARE_PROVIDER_SITE_OTHER): Payer: 59 | Admitting: Physician Assistant

## 2016-07-01 ENCOUNTER — Encounter: Payer: Self-pay | Admitting: Physician Assistant

## 2016-07-01 VITALS — BP 144/82 | HR 77 | Ht 69.0 in | Wt 166.4 lb

## 2016-07-01 DIAGNOSIS — I5181 Takotsubo syndrome: Secondary | ICD-10-CM

## 2016-07-01 DIAGNOSIS — J42 Unspecified chronic bronchitis: Secondary | ICD-10-CM | POA: Diagnosis not present

## 2016-07-01 DIAGNOSIS — Z79899 Other long term (current) drug therapy: Secondary | ICD-10-CM | POA: Diagnosis not present

## 2016-07-01 DIAGNOSIS — Z720 Tobacco use: Secondary | ICD-10-CM | POA: Diagnosis not present

## 2016-07-01 LAB — BASIC METABOLIC PANEL
BUN: 13 mg/dL (ref 7–25)
CO2: 26 mmol/L (ref 20–31)
CREATININE: 0.87 mg/dL (ref 0.70–1.25)
Calcium: 8.9 mg/dL (ref 8.6–10.3)
Chloride: 102 mmol/L (ref 98–110)
Glucose, Bld: 118 mg/dL — ABNORMAL HIGH (ref 65–99)
Potassium: 4.4 mmol/L (ref 3.5–5.3)
Sodium: 137 mmol/L (ref 135–146)

## 2016-07-01 NOTE — Patient Instructions (Addendum)
Medication Instructions:  Your physician recommends that you continue on your current medications as directed. Please refer to the Current Medication list given to you today.  Labwork: Your physician recommends that you return for lab work TODAY: BMET.  Testing/Procedures: None Ordered  Follow-Up: Your physician recommends that you schedule a follow-up appointment in: 3 MONTHS with DR CRENSHAW with LFT after visit.  Any Other Special Instructions Will Be Listed Below (If Applicable). NEXT APPT:  DATE:_________________________________________________________  TIME:_________________________________________AM/ PM   If you need a refill on your cardiac medications before your next appointment, please call your pharmacy.

## 2016-07-02 ENCOUNTER — Telehealth: Payer: Self-pay | Admitting: Cardiology

## 2016-07-02 NOTE — Telephone Encounter (Signed)
Returned call to patient-aware of lab results:   Notes Recorded by Almyra Deforest, PA on 07/02/2016 at 7:10 AM EDT Stable electrolyte and renal function, no obvious lab changes as result of lisinopril. Continue current medication  Verbalized understanding and advised to call with further questions/concerns.

## 2016-07-02 NOTE — Telephone Encounter (Signed)
New message   Pt verbalized that he is calling for the lab results

## 2016-07-22 ENCOUNTER — Other Ambulatory Visit: Payer: Self-pay | Admitting: Cardiology

## 2016-07-22 NOTE — Telephone Encounter (Signed)
REFILL 

## 2016-07-25 ENCOUNTER — Telehealth: Payer: Self-pay | Admitting: Cardiology

## 2016-07-25 ENCOUNTER — Other Ambulatory Visit: Payer: Self-pay

## 2016-07-25 MED ORDER — ATORVASTATIN CALCIUM 40 MG PO TABS: 40.0000 mg | ORAL_TABLET | Freq: Every day | ORAL | 11 refills | Status: DC

## 2016-07-25 NOTE — Telephone Encounter (Signed)
New Message   *STAT* If patient is at the pharmacy, call can be transferred to refill team.   1. Which medications need to be refilled? (please list name of each medication and dose if known) Atorvastatin 40 mg tablet once daily at 6 pm with meal  2. Which pharmacy/location (including street and city if local pharmacy) is medication to be sent to? [facility] [address], [place]  3. Do they need a 30 day or 90 day supply? 30 day supply

## 2016-09-30 NOTE — Progress Notes (Signed)
HPI: Follow-up coronary artery disease. Patient previously admitted with a non-ST elevation myocardial infarction. Cardiac catheterization 06/13/2016 showed mild diffuse nonobstructive coronary disease. Ejection fraction was 25-30% and felt consistent with tako tsubo CM. Echocardiogram 2 days later showed normal LV function and mild right ventricular enlargement. Since last seen the patient has dyspnea with more extreme activities but not with routine activities. It is relieved with rest. It is not associated with chest pain. There is no orthopnea, PND or pedal edema. There is no syncope or palpitations. There is no exertional chest pain.   Current Outpatient Prescriptions  Medication Sig Dispense Refill  . albuterol (PROVENTIL HFA;VENTOLIN HFA) 108 (90 Base) MCG/ACT inhaler Inhale 2 puffs into the lungs every 6 (six) hours as needed for wheezing or shortness of breath.    Marland Kitchen aspirin 81 MG chewable tablet CHEW AND SWALLOW 1 TABLET(81 MG) BY MOUTH DAILY 30 tablet 2  . atorvastatin (LIPITOR) 40 MG tablet Take 1 tablet (40 mg total) by mouth daily at 6 PM. 30 tablet 11  . buPROPion (WELLBUTRIN XL) 300 MG 24 hr tablet Take 300 mg by mouth daily.  1  . citalopram (CELEXA) 40 MG tablet Take 40 mg by mouth daily.  0  . lisinopril (PRINIVIL,ZESTRIL) 2.5 MG tablet TAKE 1 TABLET(2.5 MG) BY MOUTH DAILY 30 tablet 2  . methadone (DOLOPHINE) 10 MG/ML solution Take 14 mg by mouth daily.    . metoprolol succinate (TOPROL-XL) 25 MG 24 hr tablet Take 0.5 tablets (12.5 mg total) by mouth daily. 60 tablet 0   No current facility-administered medications for this visit.      Past Medical History:  Diagnosis Date  . COPD (chronic obstructive pulmonary disease) ([place])   . Elevated troponin 06/13/2016    Past Surgical History:  Procedure Laterality Date  . CARDIAC CATHETERIZATION N/A 06/13/2016   Procedure: Left Heart Cath and Coronary Angiography;  Surgeon: Nelva Bush, MD;  Location: [place] [facility];  Service: Cardiovascular;  Laterality: N/A;  . TONSILLECTOMY      Social History   Social History  . Marital status: Legally Separated    Spouse name: N/A  . Number of children: N/A  . Years of education: N/A   Occupational History  . Not on file.   Social History Main Topics  . Smoking status: Current Every Day Smoker    Types: Cigarettes  . Smokeless tobacco: Never Used  . Alcohol use No     Comment: No alcohol for 17 years  . Drug use: No  . Sexual activity: Not on file   Other Topics Concern  . Not on file   Social History Narrative  . No narrative on file    Family History  Problem Relation Age of Onset  . Hypertension Mother   . Hypertension Father     ROS: no fevers or chills, productive cough, hemoptysis, dysphasia, odynophagia, melena, hematochezia, dysuria, hematuria, rash, seizure activity, orthopnea, PND, pedal edema, claudication. Remaining systems are negative.  Physical Exam: Well-developed well-nourished in no acute distress.  Skin is warm and dry.  HEENT is normal.  Neck is supple.  Chest is clear to auscultation with normal expansion.  Cardiovascular exam is regular rate and rhythm.  Abdominal exam nontender or distended. No masses palpated. Extremities show no edema. neuro grossly intact  A/P  1 Coronary artery disease-continue aspirin and statin.  2 bruit-schedule abdominal ultrasound to exclude aneurysm.  3 hyperlipidemia-continue statin. Check lipids and liver.  4 cardiomyopathy-improved  on follow-up echocardiogram. Continue ACE inhibitor and beta blocker.  5 tobacco abuse-he has discontinued now for 3 weeks.  Kirk [place], MD

## 2016-10-03 ENCOUNTER — Ambulatory Visit (INDEPENDENT_AMBULATORY_CARE_PROVIDER_SITE_OTHER): Payer: Medicare Other | Admitting: Cardiology

## 2016-10-03 ENCOUNTER — Encounter: Payer: Self-pay | Admitting: Cardiology

## 2016-10-03 VITALS — BP 140/76 | HR 93 | Ht 69.0 in | Wt 168.0 lb

## 2016-10-03 DIAGNOSIS — I2510 Atherosclerotic heart disease of native coronary artery without angina pectoris: Secondary | ICD-10-CM | POA: Diagnosis not present

## 2016-10-03 DIAGNOSIS — Z87891 Personal history of nicotine dependence: Secondary | ICD-10-CM

## 2016-10-03 DIAGNOSIS — R0989 Other specified symptoms and signs involving the circulatory and respiratory systems: Secondary | ICD-10-CM

## 2016-10-03 DIAGNOSIS — E7800 Pure hypercholesterolemia, unspecified: Secondary | ICD-10-CM | POA: Diagnosis not present

## 2016-10-03 LAB — LIPID PANEL
CHOL/HDL RATIO: 2 ratio (ref <5.0)
Cholesterol: 129 mg/dL (ref <200)
HDL: 65 mg/dL (ref >40)
LDL CALC: 48 mg/dL (ref <100)
TRIGLYCERIDES: 81 mg/dL (ref <150)
VLDL: 16 mg/dL (ref <30)

## 2016-10-03 LAB — HEPATIC FUNCTION PANEL
ALBUMIN: 4.3 g/dL (ref 3.6–5.1)
ALK PHOS: 56 U/L (ref 40–115)
ALT: 17 U/L (ref 9–46)
AST: 14 U/L (ref 10–35)
Bilirubin, Direct: 0.1 mg/dL (ref <=0.2)
Indirect Bilirubin: 0.3 mg/dL (ref 0.2–1.2)
TOTAL PROTEIN: 6.7 g/dL (ref 6.1–8.1)
Total Bilirubin: 0.4 mg/dL (ref 0.2–1.2)

## 2016-10-03 NOTE — Patient Instructions (Signed)
Medication Instructions:   NO CHANGE  Labwork:  Your physician recommends that you HAVE LAB WORK TODAY  Testing/Procedures:  Your physician has requested that you have an abdominal aorta duplex. During this test, an ultrasound is used to evaluate the aorta. Allow 30 minutes for this exam. Do not eat after midnight the day before and avoid carbonated beverages   Follow-Up:  Your physician wants you to follow-up in: [place] will receive a reminder letter in the mail two months in advance. If you don't receive a letter, please call our office to schedule the follow-up appointment.   If you need a refill on your cardiac medications before your next appointment, please call your pharmacy.

## 2016-10-04 ENCOUNTER — Encounter: Payer: Self-pay | Admitting: *Deleted

## 2016-10-09 ENCOUNTER — Ambulatory Visit (HOSPITAL_COMMUNITY)
Admission: RE | Admit: 2016-10-09 | Discharge: 2016-10-09 | Disposition: A | Discharge status: Home / Self Care | Payer: Medicare Other | Source: Ambulatory Visit | Attending: Cardiovascular Disease | Admitting: Cardiovascular Disease

## 2016-10-09 DIAGNOSIS — I708 Atherosclerosis of other arteries: Secondary | ICD-10-CM | POA: Insufficient documentation

## 2016-10-09 DIAGNOSIS — Z87891 Personal history of nicotine dependence: Secondary | ICD-10-CM | POA: Diagnosis not present

## 2016-10-09 DIAGNOSIS — Z136 Encounter for screening for cardiovascular disorders: Secondary | ICD-10-CM | POA: Diagnosis not present

## 2016-10-09 DIAGNOSIS — R0989 Other specified symptoms and signs involving the circulatory and respiratory systems: Secondary | ICD-10-CM | POA: Diagnosis not present

## 2016-10-09 DIAGNOSIS — I2510 Atherosclerotic heart disease of native coronary artery without angina pectoris: Secondary | ICD-10-CM | POA: Diagnosis not present

## 2016-10-09 DIAGNOSIS — I700 Atherosclerosis of aorta: Secondary | ICD-10-CM | POA: Insufficient documentation

## 2016-10-09 DIAGNOSIS — I10 Essential (primary) hypertension: Secondary | ICD-10-CM | POA: Insufficient documentation

## 2016-10-15 ENCOUNTER — Encounter: Payer: Self-pay | Admitting: *Deleted

## 2016-10-22 ENCOUNTER — Other Ambulatory Visit: Payer: Self-pay | Admitting: Cardiology

## 2016-10-22 NOTE — Telephone Encounter (Signed)
Rx request sent to pharmacy.  

## 2016-10-24 ENCOUNTER — Other Ambulatory Visit: Payer: Self-pay

## 2016-10-24 MED ORDER — METOPROLOL SUCCINATE ER 25 MG PO TB24: 12.5000 mg | ORAL_TABLET | Freq: Every day | ORAL | 11 refills | Status: DC

## 2016-10-24 NOTE — Telephone Encounter (Signed)
Rx(s) sent to pharmacy electronically.  

## 2017-05-17 ENCOUNTER — Other Ambulatory Visit: Payer: Self-pay | Admitting: Physician Assistant

## 2017-05-19 NOTE — Telephone Encounter (Signed)
Please review for refill, thanks ! 

## 2017-05-26 ENCOUNTER — Other Ambulatory Visit: Payer: Self-pay | Admitting: *Deleted

## 2017-05-26 ENCOUNTER — Other Ambulatory Visit: Payer: Self-pay

## 2017-05-26 MED ORDER — LISINOPRIL 2.5 MG PO TABS: ORAL_TABLET | ORAL | 3 refills | Status: DC

## 2017-05-26 MED ORDER — LISINOPRIL 2.5 MG PO TABS: ORAL_TABLET | ORAL | 6 refills | Status: DC

## 2017-06-16 ENCOUNTER — Other Ambulatory Visit: Payer: Self-pay | Admitting: Physician Assistant

## 2017-06-16 NOTE — Telephone Encounter (Signed)
Please review for refill. Thanks!  

## 2017-08-14 DIAGNOSIS — Z23 Encounter for immunization: Secondary | ICD-10-CM | POA: Diagnosis not present

## 2017-08-14 DIAGNOSIS — R0602 Shortness of breath: Secondary | ICD-10-CM | POA: Diagnosis not present

## 2017-08-14 DIAGNOSIS — Z716 Tobacco abuse counseling: Secondary | ICD-10-CM | POA: Diagnosis not present

## 2017-08-14 DIAGNOSIS — F17210 Nicotine dependence, cigarettes, uncomplicated: Secondary | ICD-10-CM | POA: Diagnosis not present

## 2017-08-14 DIAGNOSIS — Z87891 Personal history of nicotine dependence: Secondary | ICD-10-CM | POA: Diagnosis not present

## 2017-08-14 DIAGNOSIS — J449 Chronic obstructive pulmonary disease, unspecified: Secondary | ICD-10-CM | POA: Diagnosis not present

## 2017-08-15 DIAGNOSIS — Z87891 Personal history of nicotine dependence: Secondary | ICD-10-CM | POA: Diagnosis not present

## 2017-08-27 DIAGNOSIS — R918 Other nonspecific abnormal finding of lung field: Secondary | ICD-10-CM | POA: Diagnosis not present

## 2017-09-01 ENCOUNTER — Telehealth: Payer: Self-pay | Admitting: Cardiology

## 2017-09-01 MED ORDER — LISINOPRIL 10 MG PO TABS: 10.0000 mg | ORAL_TABLET | Freq: Every day | ORAL | 3 refills | Status: DC

## 2017-09-01 NOTE — Telephone Encounter (Signed)
Returned call to patient.He stated B/P has been elevated as listed below.No pulse done.Rechecked B/P 145/95.Stated he wanted to ask Dr.Crenshaw if he could increase one of his B/P medications.Advised I will send message to Dr.Crenshaw.

## 2017-09-01 NOTE — Telephone Encounter (Signed)
Pt c/o BP issue: STAT if pt c/o blurred vision, one-sided weakness or slurred speech  1. What are your last 5 BP readings? 140/92  175/110  160/105 2. Are you having any other symptoms (ex. Dizziness, headache, blurred vision, passed out)?headaches  3. What is your BP issue? High

## 2017-09-01 NOTE — Telephone Encounter (Signed)
Spoke with pt, Aware of dr crenshaw's recommendations. New script sent to the pharmacy  

## 2017-09-01 NOTE — Telephone Encounter (Signed)
Change lisinopril to 10 mg daily; bmet 1 week; follow BP and call if remains elevated Kirk [place]

## 2017-09-29 IMAGING — DX DG CHEST 2V
2 series · 2 of 2 positions shown · non-contrast
Comparison: 04/20/2008

CLINICAL DATA: Shortness of breath and cough for 2 days.

EXAM:
CHEST  2 VIEW

[w chest pa]
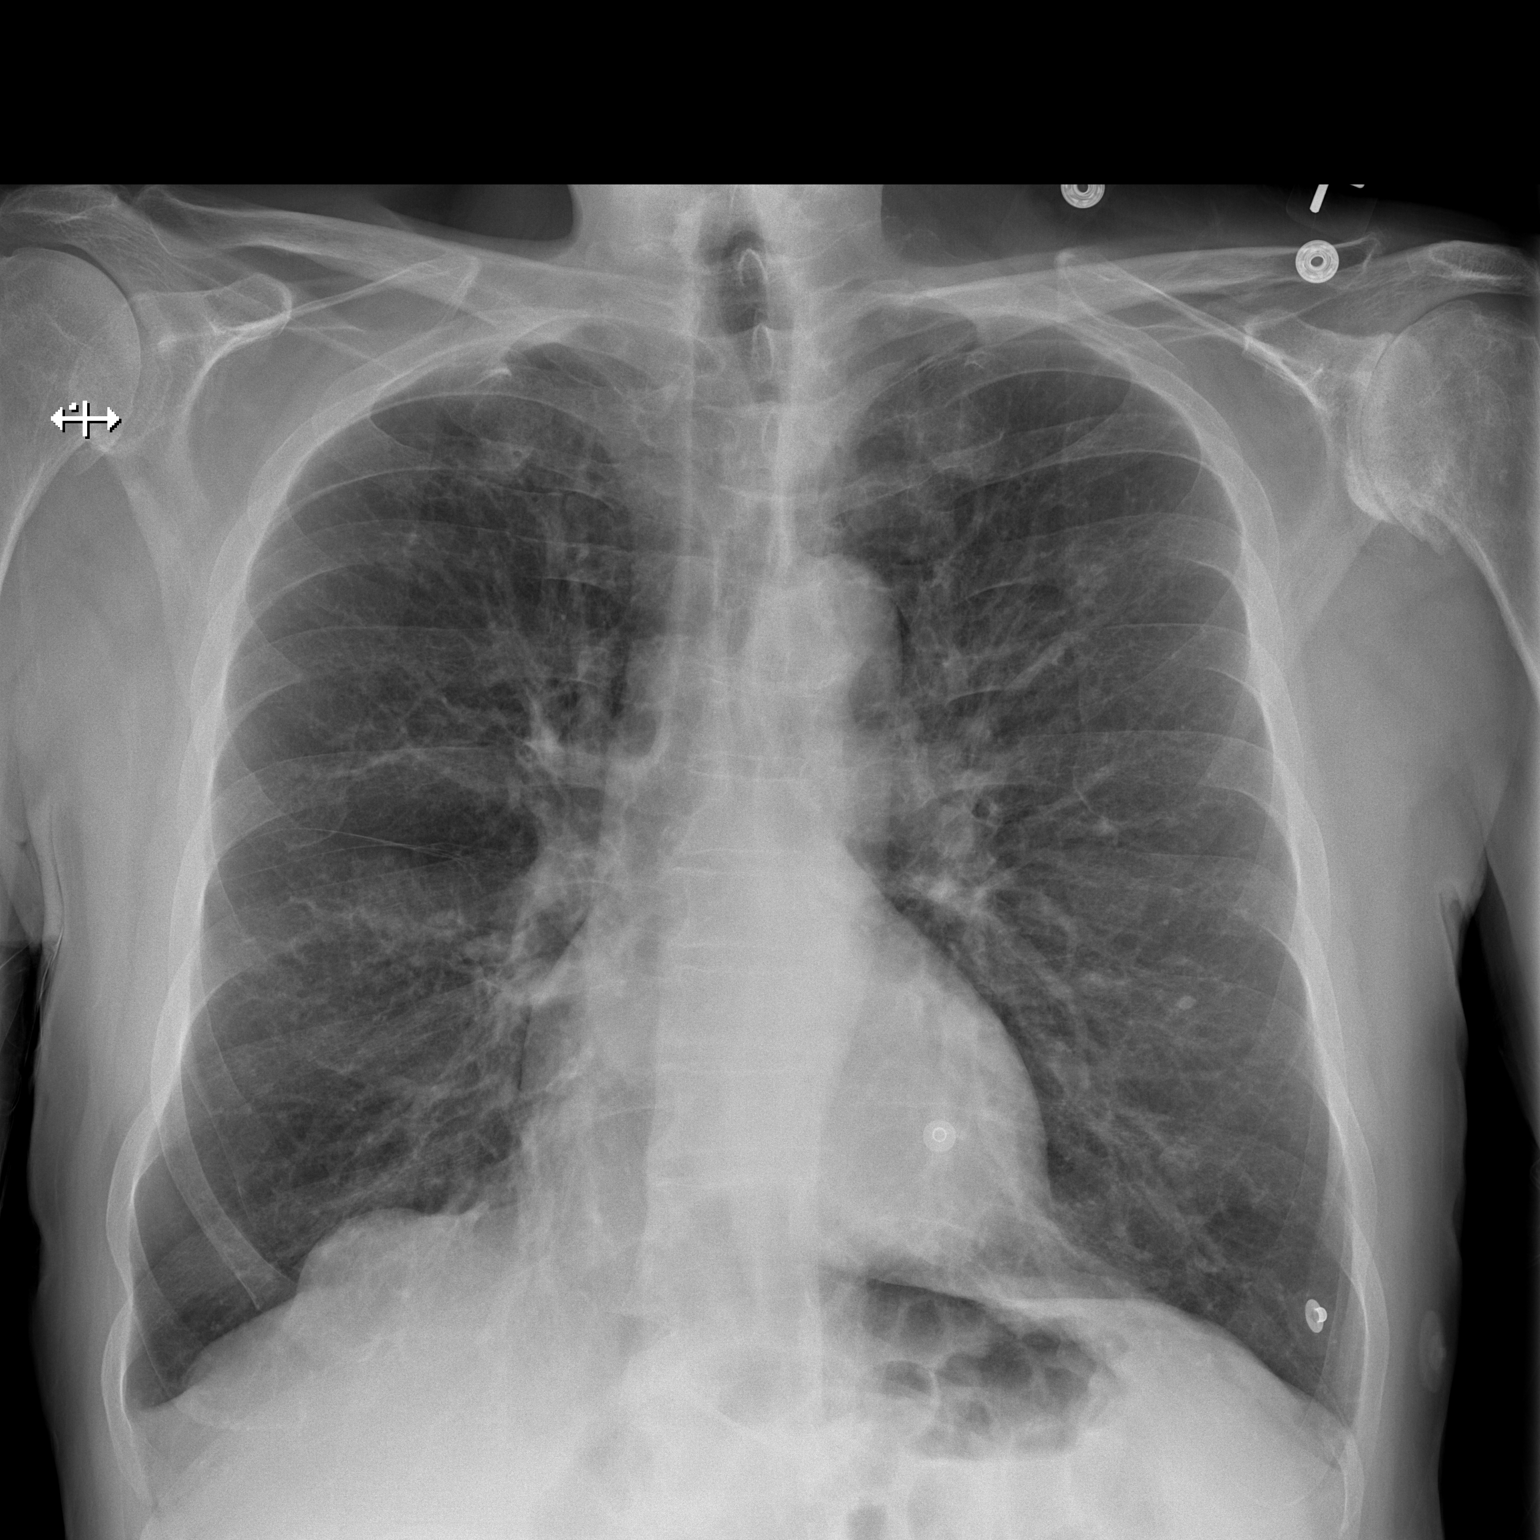

[w chest lat]
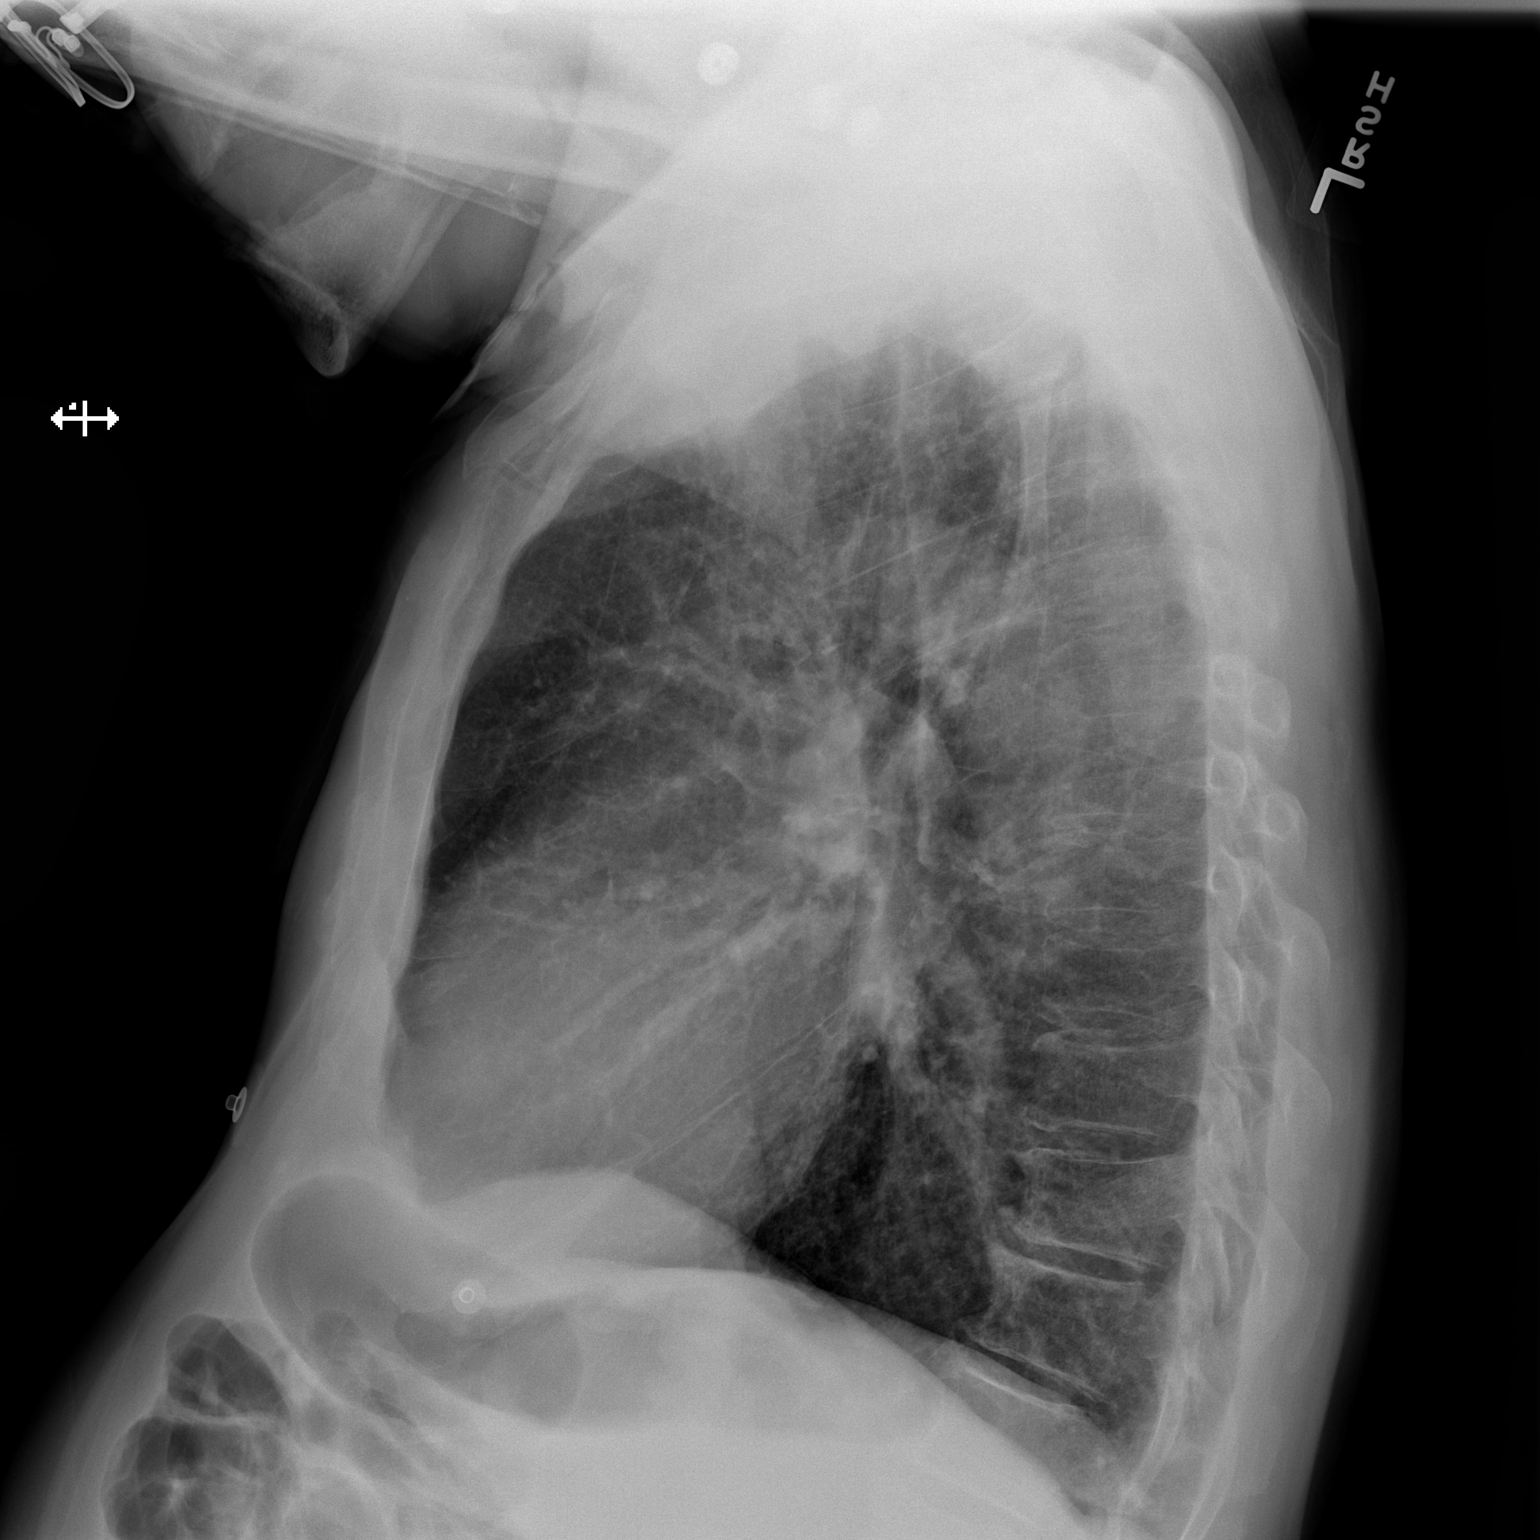

[2 of 2 positions shown; findings below may reference images not displayed]

FINDINGS: Cardiomediastinal silhouette is normal. Mediastinal contours appear
intact.

There is no evidence of pleural effusion or pneumothorax. Subtle
peribronchovascular airspace consolidation in the right middle lobe.

Osseous structures are without acute abnormality. Soft tissues are
grossly normal.
IMPRESSION: Subtle airspace consolidation in the right middle lobe may represent
developing broncho pneumonia.

## 2017-10-11 ENCOUNTER — Other Ambulatory Visit: Payer: Self-pay | Admitting: Cardiology

## 2017-10-13 ENCOUNTER — Other Ambulatory Visit: Payer: Self-pay | Admitting: Cardiology

## 2017-10-13 NOTE — Telephone Encounter (Signed)
REFILL 

## 2017-11-06 NOTE — Progress Notes (Signed)
HPI: FU coronary artery disease. Patient previously admitted with a non-ST elevation myocardial infarction. Cardiac catheterization 06/13/2016 showed mild diffuse nonobstructive coronary disease. Ejection fraction was 25-30% and felt consistent with tako tsubo CM. Echocardiogram 2 days later showed normal LV function and mild right ventricular enlargement. Abdominal ultrasound December 2017 showed 3.1 x 2.8 cm suprarenal saccular aneurysm. Since last seen  he has mild dyspnea on exertion but no orthopnea, PND, pedal edema, exertional chest pain or syncope.  Current Outpatient Medications  Medication Sig Dispense Refill  . albuterol (PROVENTIL HFA;VENTOLIN HFA) 108 (90 Base) MCG/ACT inhaler Inhale 2 puffs into the lungs every 6 (six) hours as needed for wheezing or shortness of breath.    Marland Kitchen aspirin 81 MG chewable tablet CHEW AND SWALLOW 1 TABLET(81 MG) BY MOUTH DAILY 30 tablet 2  . atorvastatin (LIPITOR) 40 MG tablet Take 1 tablet (40 mg total) by mouth daily at 6 PM. 90 tablet 1  . buPROPion (WELLBUTRIN XL) 300 MG 24 hr tablet Take 300 mg by mouth daily.  1  . citalopram (CELEXA) 40 MG tablet Take 40 mg by mouth daily.  0  . lisinopril (PRINIVIL,ZESTRIL) 40 MG tablet Take 1 tablet (40 mg total) by mouth daily. 90 tablet 3  . metoprolol succinate (TOPROL-XL) 25 MG 24 hr tablet TAKE 1/2 TABLET(12.5 MG) BY MOUTH DAILY 45 tablet 3   No current facility-administered medications for this visit.      Past Medical History:  Diagnosis Date  . COPD (chronic obstructive pulmonary disease) ([place])   . Elevated troponin 06/13/2016    Past Surgical History:  Procedure Laterality Date  . CARDIAC CATHETERIZATION N/A 06/13/2016   Procedure: Left Heart Cath and Coronary Angiography;  Surgeon: Nelva Bush, MD;  Location: [place] [facility];  Service: Cardiovascular;  Laterality: N/A;  . TONSILLECTOMY      Social History   Socioeconomic History  . Marital status: Legally Separated    Spouse  name: Not on file  . Number of children: Not on file  . Years of education: Not on file  . Highest education level: Not on file  Social Needs  . Financial resource strain: Not on file  . Food insecurity - worry: Not on file  . Food insecurity - inability: Not on file  . Transportation needs - medical: Not on file  . Transportation needs - non-medical: Not on file  Occupational History  . Not on file  Tobacco Use  . Smoking status: Current Every Day Smoker    Types: Cigarettes  . Smokeless tobacco: Never Used  Substance and Sexual Activity  . Alcohol use: No    Comment: No alcohol for 17 years  . Drug use: No  . Sexual activity: Not on file  Other Topics Concern  . Not on file  Social History Narrative  . Not on file    Family History  Problem Relation Age of Onset  . Hypertension Mother   . Hypertension Father     ROS: no fevers or chills, productive cough, hemoptysis, dysphasia, odynophagia, melena, hematochezia, dysuria, hematuria, rash, seizure activity, orthopnea, PND, pedal edema, claudication. Remaining systems are negative.  Physical Exam: Well-developed well-nourished in no acute distress.  Skin is warm and dry.  HEENT is normal.  Neck is supple.  Chest is clear to auscultation with normal expansion.  Cardiovascular exam is regular rate and rhythm.  Abdominal exam nontender or distended. No masses palpated. Extremities show no edema. neuro grossly intact  A/P  1 coronary artery disease-no chest pain.  Continue aspirin and statin.  2 abdominal aortic aneurysm-we will arrange follow-up ultrasound.  3 cardiomyopathy-LV function improved.  Continue beta-blocker and ACE inhibitor.  4 hyperlipidemia-continue statin.  Check lipids and liver.  5 hypertension-blood pressure elevated.  Increase lisinopril to 40 mg daily.  In 1 week check potassium and renal function.  Kirk [place], MD

## 2017-11-13 ENCOUNTER — Ambulatory Visit (INDEPENDENT_AMBULATORY_CARE_PROVIDER_SITE_OTHER): Payer: Medicare Other | Admitting: Cardiology

## 2017-11-13 ENCOUNTER — Encounter: Payer: Self-pay | Admitting: Cardiology

## 2017-11-13 VITALS — BP 148/80 | HR 89 | Ht 70.0 in | Wt 184.4 lb

## 2017-11-13 DIAGNOSIS — I714 Abdominal aortic aneurysm, without rupture, unspecified: Secondary | ICD-10-CM

## 2017-11-13 DIAGNOSIS — I2510 Atherosclerotic heart disease of native coronary artery without angina pectoris: Secondary | ICD-10-CM

## 2017-11-13 DIAGNOSIS — I10 Essential (primary) hypertension: Secondary | ICD-10-CM

## 2017-11-13 DIAGNOSIS — E7800 Pure hypercholesterolemia, unspecified: Secondary | ICD-10-CM

## 2017-11-13 MED ORDER — LISINOPRIL 40 MG PO TABS: 40.0000 mg | ORAL_TABLET | Freq: Every day | ORAL | 3 refills | Status: DC

## 2017-11-13 NOTE — Patient Instructions (Signed)
Medication Instructions:   INCREASE LISINOPRIL TO 40 MG ONCE DAILY= 4 OF THE 10 MG TABLETS ONCE DAILY  Labwork:  Your physician recommends that you return for lab work [place]  Testing/Procedures:  Your physician has requested that you have an abdominal aorta duplex. During this test, an ultrasound is used to evaluate the aorta. Allow 30 minutes for this exam. Do not eat after midnight the day before and avoid carbonated beverages   Follow-Up:  Your physician wants you to follow-up in: [place] will receive a reminder letter in the mail two months in advance. If you don't receive a letter, please call our office to schedule the follow-up appointment.   If you need a refill on your cardiac medications before your next appointment, please call your pharmacy.

## 2017-11-20 ENCOUNTER — Encounter: Payer: Self-pay | Admitting: *Deleted

## 2017-11-20 DIAGNOSIS — I2510 Atherosclerotic heart disease of native coronary artery without angina pectoris: Secondary | ICD-10-CM | POA: Diagnosis not present

## 2017-11-20 LAB — LIPID PANEL
CHOL/HDL RATIO: 2.4 ratio (ref 0.0–5.0)
Cholesterol, Total: 118 mg/dL (ref 100–199)
HDL: 49 mg/dL (ref >39)
LDL Calculated: 48 mg/dL (ref 0–99)
TRIGLYCERIDES: 105 mg/dL (ref 0–149)
VLDL Cholesterol Cal: 21 mg/dL (ref 5–40)

## 2017-11-20 LAB — COMPREHENSIVE METABOLIC PANEL
A/G RATIO: 2.1 (ref 1.2–2.2)
ALT: 20 IU/L (ref 0–44)
AST: 14 IU/L (ref 0–40)
Albumin: 4.5 g/dL (ref 3.5–4.8)
Alkaline Phosphatase: 72 IU/L (ref 39–117)
BILIRUBIN TOTAL: 0.3 mg/dL (ref 0.0–1.2)
BUN / CREAT RATIO: 17 (ref 10–24)
BUN: 17 mg/dL (ref 8–27)
CALCIUM: 9.2 mg/dL (ref 8.6–10.2)
CO2: 25 mmol/L (ref 20–29)
CREATININE: 1.03 mg/dL (ref 0.76–1.27)
Chloride: 100 mmol/L (ref 96–106)
GFR, EST AFRICAN AMERICAN: 84 mL/min/{1.73_m2} (ref >59)
GFR, EST NON AFRICAN AMERICAN: 73 mL/min/{1.73_m2} (ref >59)
Globulin, Total: 2.1 g/dL (ref 1.5–4.5)
Glucose: 122 mg/dL — ABNORMAL HIGH (ref 65–99)
Potassium: 4.6 mmol/L (ref 3.5–5.2)
SODIUM: 139 mmol/L (ref 134–144)
Total Protein: 6.6 g/dL (ref 6.0–8.5)

## 2017-11-24 ENCOUNTER — Ambulatory Visit (HOSPITAL_COMMUNITY)
Admission: RE | Admit: 2017-11-24 | Discharge: 2017-11-24 | Disposition: A | Discharge status: Home / Self Care | Payer: Medicare Other | Source: Ambulatory Visit | Attending: Cardiology | Admitting: Cardiology

## 2017-11-24 DIAGNOSIS — I2510 Atherosclerotic heart disease of native coronary artery without angina pectoris: Secondary | ICD-10-CM | POA: Diagnosis not present

## 2017-11-24 DIAGNOSIS — F17200 Nicotine dependence, unspecified, uncomplicated: Secondary | ICD-10-CM | POA: Insufficient documentation

## 2017-11-24 DIAGNOSIS — I714 Abdominal aortic aneurysm, without rupture, unspecified: Secondary | ICD-10-CM

## 2017-11-24 DIAGNOSIS — I10 Essential (primary) hypertension: Secondary | ICD-10-CM | POA: Insufficient documentation

## 2018-01-14 ENCOUNTER — Other Ambulatory Visit: Payer: Self-pay | Admitting: Cardiology

## 2018-03-06 DIAGNOSIS — R918 Other nonspecific abnormal finding of lung field: Secondary | ICD-10-CM | POA: Diagnosis not present

## 2018-03-06 DIAGNOSIS — J449 Chronic obstructive pulmonary disease, unspecified: Secondary | ICD-10-CM | POA: Diagnosis not present

## 2018-03-06 DIAGNOSIS — M4807 Spinal stenosis, lumbosacral region: Secondary | ICD-10-CM | POA: Diagnosis not present

## 2018-03-06 DIAGNOSIS — K4090 Unilateral inguinal hernia, without obstruction or gangrene, not specified as recurrent: Secondary | ICD-10-CM | POA: Diagnosis not present

## 2018-03-09 DIAGNOSIS — R0602 Shortness of breath: Secondary | ICD-10-CM | POA: Diagnosis not present

## 2018-03-09 DIAGNOSIS — R918 Other nonspecific abnormal finding of lung field: Secondary | ICD-10-CM | POA: Diagnosis not present

## 2018-03-09 DIAGNOSIS — J449 Chronic obstructive pulmonary disease, unspecified: Secondary | ICD-10-CM | POA: Diagnosis not present

## 2018-03-24 ENCOUNTER — Ambulatory Visit: Payer: Self-pay | Admitting: Surgery

## 2018-03-24 DIAGNOSIS — K4090 Unilateral inguinal hernia, without obstruction or gangrene, not specified as recurrent: Secondary | ICD-10-CM | POA: Diagnosis not present

## 2018-03-24 NOTE — H&P (Signed)
Gary Wright Documented: 03/24/2018 3:11 PM Location: [facility] Patient #: [id number] DOB: 17-Sep-1946 Married / Language: Gary Wright / Race: White Male  History of Present Illness Gary Wright A. Gary Linville MD; 03/24/2018 3:36 PM) Patient words: Patient sent at the request of Gary Alken PA for 3 month history of right groin bulge. He noticed it for months ago while chopping some wood. He developed swelling and mild to moderate discomfort which is intermittent made worse with activity. The bulge is smaller when he lays back. There is no associated nausea or vomiting with it. There is no redness or drainage. He denies any difficulty with bowel or bladder function. Pain is dull and achy in nature and a 3 to 4 out of 10.  The patient is a 72 year old male.   Past Surgical History Gary Wright; 03/24/2018 3:11 PM) Shoulder Surgery Left. Spinal Surgery - Lower Back Tonsillectomy  Allergies Gary Wright; 03/24/2018 3:11 PM) No Known Drug Allergies [03/24/2018]: Allergies Reconciled  Medication History Gary Wright; 03/24/2018 3:12 PM) Atorvastatin Calcium (40MG  Tablet, Oral) Active. BuPROPion HCl ER (XL) (300MG  Tablet ER 24HR, Oral) Active. Citalopram Hydrobromide (40MG  Tablet, Oral) Active. Lisinopril (40MG  Tablet, Oral) Active. Metoprolol Succinate ER (25MG  Tablet ER 24HR, Oral) Active. Medications Reconciled  Social History Gary Wright; 03/24/2018 3:11 PM) Alcohol use Occasional alcohol use. Caffeine use Carbonated beverages, Coffee. No drug use Tobacco use Former smoker.  Family History Gary Wright; 03/24/2018 3:11 PM) Arthritis Mother. Hypertension Mother. Migraine Headache Mother. Respiratory Condition Father.  Other Problems Gary Wright; 03/24/2018 3:11 PM) Back Pain Chronic Obstructive Lung Disease High blood pressure     Review of Systems Gary Wright; 03/24/2018 3:11 PM) General Present- Weight Gain. Not Present- Appetite  Loss, Chills, Fatigue, Fever, Night Sweats and Weight Loss. Skin Not Present- Change in Wart/Mole, Dryness, Hives, Jaundice, New Lesions, Non-Healing Wounds, Rash and Ulcer. HEENT Present- Wears glasses/contact lenses. Not Present- Earache, Hearing Loss, Hoarseness, Nose Bleed, Oral Ulcers, Ringing in the Ears, Seasonal Allergies, Sinus Pain, Sore Throat, Visual Disturbances and Yellow Eyes. Respiratory Present- Wheezing. Not Present- Bloody sputum, Chronic Cough, Difficulty Breathing and Snoring. Cardiovascular Present- Shortness of Breath. Not Present- Chest Pain, Difficulty Breathing Lying Down, Leg Cramps, Palpitations, Rapid Heart Rate and Swelling of Extremities. Male Genitourinary Not Present- Blood in Urine, Change in Urinary Stream, Frequency, Impotence, Nocturia, Painful Urination, Urgency and Urine Leakage. Neurological Present- Weakness. Not Present- Decreased Memory, Fainting, Headaches, Numbness, Seizures, Tingling, Tremor and Trouble walking. Psychiatric Present- Depression. Not Present- Anxiety, Bipolar, Change in Sleep Pattern, Fearful and Frequent crying. Endocrine Present- New Diabetes. Not Present- Cold Intolerance, Excessive Hunger, Hair Changes, Heat Intolerance and Hot flashes. Hematology Not Present- Blood Thinners, Easy Bruising, Excessive bleeding, Gland problems, HIV and Persistent Infections.  Vitals Gary Wright; 03/24/2018 3:12 PM) 03/24/2018 3:12 PM Weight: 180 lb Height: 70in Body Surface Area: 2 m Body Mass Index: 25.83 kg/m  Temp.: 98.59F(Oral)  Pulse: 101 (Regular)  BP: 130/82 (Sitting, Left Arm, Standard)      Physical Exam (Gary Wright A. Gary Conlee MD; 03/24/2018 3:37 PM)  General Mental Status-Alert. General Appearance-Consistent with stated age. Hydration-Well hydrated. Voice-Normal.  Head and Neck Head-normocephalic, atraumatic with no lesions or palpable masses. Trachea-midline. Thyroid Gland Characteristics - normal size and  consistency.  Chest and Lung Exam Chest and lung exam reveals -quiet, even and easy respiratory effort with no use of accessory muscles and on auscultation, normal breath sounds, no adventitious sounds and normal vocal resonance. Inspection Chest Wall - Normal. Back - normal.  Cardiovascular Cardiovascular examination reveals -normal heart sounds, regular rate and rhythm with no murmurs and normal pedal pulses bilaterally.  Abdomen Note: Reducible right inguinal hernia. No evidence of a left inguinal hernia.  Neurologic Neurologic evaluation reveals -alert and oriented x 3 with no impairment of recent or remote memory. Mental Status-Normal.  Musculoskeletal Normal Exam - Left-Upper Extremity Strength Normal and Lower Extremity Strength Normal. Normal Exam - Right-Upper Extremity Strength Normal and Lower Extremity Strength Normal.    Assessment & Plan (Gary Wright A. Gary Frankson MD; 03/24/2018 3:37 PM)  RIGHT INGUINAL HERNIA (K40.90) Impression: Discussed repair with mesh. Risks, benefits and other options discussed. Laparoscopic versus open techniques discussed. Patient's opted for open right inguinal hernia repair with mesh. The risk of hernia repair include bleeding, infection, organ injury, bowel injury, bladder injury, nerve injury recurrent hernia, blood clots, worsening of underlying condition, chronic pain, mesh use, open surgery, death, and the need for other operattions. Pt agrees to proceed  Current Plans You are being scheduled for surgery- Our schedulers will call you.  You should hear from our office's scheduling department within 5 working days about the location, date, and time of surgery. We try to make accommodations for patient's preferences in scheduling surgery, but sometimes the OR schedule or the surgeon's schedule prevents [place] from making those accommodations.  If you have not heard from our office [phone]) in 5 working days, call the office and ask  for your surgeon's nurse.  If you have other questions about your diagnosis, plan, or surgery, call the office and ask for your surgeon's nurse.  Pt Education - Gary Wright - Hernia Surgery: discussed with patient and provided information. The anatomy & physiology of the abdominal wall and pelvic floor was discussed. The pathophysiology of hernias in the inguinal and pelvic region was discussed. Natural history risks such as progressive enlargement, pain, incarceration, and strangulation was discussed. Contributors to complications such as smoking, obesity, diabetes, prior surgery, etc were discussed.  I feel the risks of no intervention will lead to serious problems that outweigh the operative risks; therefore, I recommended surgery to reduce and repair the hernia. I explained laparoscopic techniques with possible need for an open approach. I noted usual use of mesh to patch and/or buttress hernia repair  Risks such as bleeding, infection, abscess, need for further treatment, heart attack, death, and other risks were discussed. I noted a good likelihood this will help address the problem. Goals of post-operative recovery were discussed as well. Possibility that this will not correct all symptoms was explained. I stressed the importance of low-impact activity, aggressive pain control, avoiding constipation, & not pushing through pain to minimize risk of post-operative chronic pain or injury. Possibility of reherniation was discussed. We will work to minimize complications.  An educational handout further explaining the pathology & treatment options was Wright as well. Questions were answered. The patient expresses understanding & wishes to proceed with surgery.  Pt Education - CCS Mesh education: discussed with patient and provided information. Pt Education - Consent for inguinal hernia : discu

## 2018-03-24 NOTE — H&P (View-Only) (Signed)
Gary Wright Documented: 03/24/2018 3:11 PM Location: [facility] Patient #: [id number] DOB: 1946-09-01 Married / Language: Cleophus Molt / Race: White Male  History of Present Illness Gary Moores A. Shambhavi Salley MD; 03/24/2018 3:36 PM) Patient words: Patient sent at the request of Gaye Alken PA for 3 month history of right groin bulge. He noticed it for months ago while chopping some wood. He developed swelling and mild to moderate discomfort which is intermittent made worse with activity. The bulge is smaller when he lays back. There is no associated nausea or vomiting with it. There is no redness or drainage. He denies any difficulty with bowel or bladder function. Pain is dull and achy in nature and a 3 to 4 out of 10.  The patient is a 72 year old male.   Past Surgical History Sabino Gasser; 03/24/2018 3:11 PM) Shoulder Surgery Left. Spinal Surgery - Lower Back Tonsillectomy  Allergies Sabino Gasser; 03/24/2018 3:11 PM) No Known Drug Allergies [03/24/2018]: Allergies Reconciled  Medication History Sabino Gasser; 03/24/2018 3:12 PM) Atorvastatin Calcium (40MG  Tablet, Oral) Active. BuPROPion HCl ER (XL) (300MG  Tablet ER 24HR, Oral) Active. Citalopram Hydrobromide (40MG  Tablet, Oral) Active. Lisinopril (40MG  Tablet, Oral) Active. Metoprolol Succinate ER (25MG  Tablet ER 24HR, Oral) Active. Medications Reconciled  Social History Sabino Gasser; 03/24/2018 3:11 PM) Alcohol use Occasional alcohol use. Caffeine use Carbonated beverages, Coffee. No drug use Tobacco use Former smoker.  Family History Sabino Gasser; 03/24/2018 3:11 PM) Arthritis Mother. Hypertension Mother. Migraine Headache Mother. Respiratory Condition Father.  Other Problems Sabino Gasser; 03/24/2018 3:11 PM) Back Pain Chronic Obstructive Lung Disease High blood pressure     Review of Systems Sabino Gasser; 03/24/2018 3:11 PM) General Present- Weight Gain. Not Present- Appetite  Loss, Chills, Fatigue, Fever, Night Sweats and Weight Loss. Skin Not Present- Change in Wart/Mole, Dryness, Hives, Jaundice, New Lesions, Non-Healing Wounds, Rash and Ulcer. HEENT Present- Wears glasses/contact lenses. Not Present- Earache, Hearing Loss, Hoarseness, Nose Bleed, Oral Ulcers, Ringing in the Ears, Seasonal Allergies, Sinus Pain, Sore Throat, Visual Disturbances and Yellow Eyes. Respiratory Present- Wheezing. Not Present- Bloody sputum, Chronic Cough, Difficulty Breathing and Snoring. Cardiovascular Present- Shortness of Breath. Not Present- Chest Pain, Difficulty Breathing Lying Down, Leg Cramps, Palpitations, Rapid Heart Rate and Swelling of Extremities. Male Genitourinary Not Present- Blood in Urine, Change in Urinary Stream, Frequency, Impotence, Nocturia, Painful Urination, Urgency and Urine Leakage. Neurological Present- Weakness. Not Present- Decreased Memory, Fainting, Headaches, Numbness, Seizures, Tingling, Tremor and Trouble walking. Psychiatric Present- Depression. Not Present- Anxiety, Bipolar, Change in Sleep Pattern, Fearful and Frequent crying. Endocrine Present- New Diabetes. Not Present- Cold Intolerance, Excessive Hunger, Hair Changes, Heat Intolerance and Hot flashes. Hematology Not Present- Blood Thinners, Easy Bruising, Excessive bleeding, Gland problems, HIV and Persistent Infections.  Vitals Sabino Gasser; 03/24/2018 3:12 PM) 03/24/2018 3:12 PM Weight: 180 lb Height: 70in Body Surface Area: 2 m Body Mass Index: 25.83 kg/m  Temp.: 98.47F(Oral)  Pulse: 101 (Regular)  BP: 130/82 (Sitting, Left Arm, Standard)      Physical Exam (Mildred Bollard A. Kinzi Frediani MD; 03/24/2018 3:37 PM)  General Mental Status-Alert. General Appearance-Consistent with stated age. Hydration-Well hydrated. Voice-Normal.  Head and Neck Head-normocephalic, atraumatic with no lesions or palpable masses. Trachea-midline. Thyroid Gland Characteristics - normal size and  consistency.  Chest and Lung Exam Chest and lung exam reveals -quiet, even and easy respiratory effort with no use of accessory muscles and on auscultation, normal breath sounds, no adventitious sounds and normal vocal resonance. Inspection Chest Wall - Normal. Back - normal.  Cardiovascular Cardiovascular examination reveals -normal heart sounds, regular rate and rhythm with no murmurs and normal pedal pulses bilaterally.  Abdomen Note: Reducible right inguinal hernia. No evidence of a left inguinal hernia.  Neurologic Neurologic evaluation reveals -alert and oriented x 3 with no impairment of recent or remote memory. Mental Status-Normal.  Musculoskeletal Normal Exam - Left-Upper Extremity Strength Normal and Lower Extremity Strength Normal. Normal Exam - Right-Upper Extremity Strength Normal and Lower Extremity Strength Normal.    Assessment & Plan (Kylee Umana A. Anetra Czerwinski MD; 03/24/2018 3:37 PM)  RIGHT INGUINAL HERNIA (K40.90) Impression: Discussed repair with mesh. Risks, benefits and other options discussed. Laparoscopic versus open techniques discussed. Patient's opted for open right inguinal hernia repair with mesh. The risk of hernia repair include bleeding, infection, organ injury, bowel injury, bladder injury, nerve injury recurrent hernia, blood clots, worsening of underlying condition, chronic pain, mesh use, open surgery, death, and the need for other operattions. Pt agrees to proceed  Current Plans You are being scheduled for surgery- Our schedulers will call you.  You should hear from our office's scheduling department within 5 working days about the location, date, and time of surgery. We try to make accommodations for patient's preferences in scheduling surgery, but sometimes the OR schedule or the surgeon's schedule prevents [place] from making those accommodations.  If you have not heard from our office [phone]) in 5 working days, call the office and ask  for your surgeon's nurse.  If you have other questions about your diagnosis, plan, or surgery, call the office and ask for your surgeon's nurse.  Pt Education - Pamphlet Given - Hernia Surgery: discussed with patient and provided information. The anatomy & physiology of the abdominal wall and pelvic floor was discussed. The pathophysiology of hernias in the inguinal and pelvic region was discussed. Natural history risks such as progressive enlargement, pain, incarceration, and strangulation was discussed. Contributors to complications such as smoking, obesity, diabetes, prior surgery, etc were discussed.  I feel the risks of no intervention will lead to serious problems that outweigh the operative risks; therefore, I recommended surgery to reduce and repair the hernia. I explained laparoscopic techniques with possible need for an open approach. I noted usual use of mesh to patch and/or buttress hernia repair  Risks such as bleeding, infection, abscess, need for further treatment, heart attack, death, and other risks were discussed. I noted a good likelihood this will help address the problem. Goals of post-operative recovery were discussed as well. Possibility that this will not correct all symptoms was explained. I stressed the importance of low-impact activity, aggressive pain control, avoiding constipation, & not pushing through pain to minimize risk of post-operative chronic pain or injury. Possibility of reherniation was discussed. We will work to minimize complications.  An educational handout further explaining the pathology & treatment options was given as well. Questions were answered. The patient expresses understanding & wishes to proceed with surgery.  Pt Education - CCS Mesh education: discussed with patient and provided information. Pt Education - Consent for inguinal hernia : discu

## 2018-03-25 ENCOUNTER — Telehealth: Payer: Self-pay | Admitting: Cardiology

## 2018-03-25 NOTE — Telephone Encounter (Signed)
   [facility] Pre-operative Risk Assessment    Request for surgical clearance:  1. What type of surgery is being performed? Right inguinal hernia repair   2. When is this surgery scheduled? TBD   3. What type of clearance is required (medical clearance vs. Pharmacy clearance to hold med vs. Both)? Both  4. Are there any medications that need to be held prior to surgery and how long? ASA - 5 days prior   5. Practice name and name of physician performing surgery? Dr. Marcello Moores Wright @ [facility]   6. What is your office phone number [phone]    7.   What is your office fax number [phone]  8.   Anesthesia type (None, local, MAC, general) ? General    Gary Wright 03/25/2018, 3:07 PM  _________________________________________________________________   (provider comments below)  \

## 2018-03-26 NOTE — Telephone Encounter (Signed)
   Primary Cardiologist: No primary care provider on file.  Chart reviewed as part of pre-operative protocol coverage. H/o CAD NSTEMI 2017 suspected Takotsubo with mild nonobstructive CAD, echo 2 days later normal LVEF, small AAA (no evidence of such/stable 11/2017), COPD, HTN, HLD. Last OV 10/2017 - doing well.  Will route to Dr. Stanford Breed if OK to hold ASA 5 days prior to surgery, do not see any other reason why he is on this so I anticipate should be OK then we can call patient to finalize clearance. Dr. Stanford Breed - Please route response to P CV DIV PREOP (the pre-op pool). Thank you.   Charlie Pitter, PA-C 03/26/2018, 3:48 PM

## 2018-03-27 NOTE — Telephone Encounter (Signed)
Ok to hold asa prior to procedure and resume after Gary Wright  

## 2018-03-27 NOTE — Telephone Encounter (Signed)
Left message to call back  Kerin Ransom PA-C 03/27/2018 11:25 AM

## 2018-03-27 NOTE — Telephone Encounter (Signed)
   Primary Cardiologist: Kirk Ruths, MD  Chart reviewed as part of pre-operative protocol coverage. Given past medical history and time since last visit, based on ACC/AHA guidelines, Gary Wright would be at acceptable risk for the planned procedure without further cardiovascular testing.   Per Dr Stanford Breed, OK to hold ASA up to 5 days pre op if needed.  I will route this recommendation to the requesting party via Epic fax function and remove from pre-op pool.  Please call with questions.  Kerin Ransom, PA-C 03/27/2018, 3:40 PM

## 2018-04-10 NOTE — Pre-Procedure Instructions (Signed)
Gary Wright  04/10/2018      [id number] - [place], [place] - [address] AT [address] [place] [address] Phone: [phone] Fax: [phone]  [facility] - [place], [place] AT [facility] [address] [place] [address] Phone: [phone] Fax: [phone]    Your procedure is scheduled on Tuesday July 2.  Report to [facility] Admitting at 8:30 A.M.  Call this number if you have problems the morning of surgery:  [phone]   Remember:  Do not eat or drink after midnight.  **DRINK Ensure Pre-surgery drink by 7:30 AM (3 hours prior to surgery time)**     Take these medicines the morning of surgery with A SIP OF WATER:  Metoprolol (Toprol-XL) Citalopram (Celexa) Bupropion (wellbutrin) Albuterol if needed  7 days prior to surgery STOP taking any Aleve, Naproxen, Ibuprofen, Motrin, Advil, Goody's, BC's, all herbal medications, fish oil, and all vitamins  **FOLLOW your surgeon's instructions on stopping Aspirin**     Do not wear jewelry, make-up or nail polish.  Do not wear lotions, powders, or perfumes, or deodorant.  Do not shave 48 hours prior to surgery.  Men may shave face and neck.  Do not bring valuables to the hospital.  [facility] is not responsible for any belongings or valuables.  Contacts, dentures or bridgework may not be worn into surgery.  Leave your suitcase in the car.  After surgery it may be brought to your room.  For patients admitted to the hospital, discharge time will be determined by your treatment team.  Patients discharged the day of surgery will not be allowed to drive home.  Special instructions:    [place] [facility]- Preparing For Surgery  Before surgery, you can play an important role. Because skin is not sterile, your skin needs to be as free of germs as possible. You can reduce the number of germs on  your skin by washing with CHG (chlorahexidine gluconate) Soap before surgery.  CHG is an antiseptic cleaner which kills germs and bonds with the skin to continue killing germs even after washing.    Oral Hygiene is also important to reduce your risk of infection.  Remember - BRUSH YOUR TEETH THE MORNING OF SURGERY WITH YOUR REGULAR TOOTHPASTE  Please do not use if you have an allergy to CHG or antibacterial soaps. If your skin becomes reddened/irritated stop using the CHG.  Do not shave (including legs and underarms) for at least 48 hours prior to first CHG shower. It is OK to shave your face.  Please follow these instructions carefully.   1. Shower the NIGHT BEFORE SURGERY and the MORNING OF SURGERY with CHG.   2. If you chose to wash your hair, wash your hair first as usual with your normal shampoo.  3. After you shampoo, rinse your hair and body thoroughly to remove the shampoo.  4. Use CHG as you would any other liquid soap. You can apply CHG directly to the skin and wash gently with a scrungie or a clean washcloth.   5. Apply the CHG Soap to your body ONLY FROM THE NECK DOWN.  Do not use on open wounds or open sores. Avoid contact with your eyes, ears, mouth and genitals (private parts). Wash Face and genitals (private parts)  with your normal soap.  6. Wash thoroughly, paying special attention to the area where your surgery  will be performed.  7. Thoroughly rinse your body with warm water from the neck down.  8. DO NOT shower/wash with your normal soap after using and rinsing off the CHG Soap.  9. Pat yourself dry with a CLEAN TOWEL.  10. Wear CLEAN PAJAMAS to bed the night before surgery, wear comfortable clothes the morning of surgery  11. Place CLEAN SHEETS on your bed the night of your first shower and DO NOT SLEEP WITH PETS.    Day of Surgery:  Do not apply any deodorants/lotions.  Please wear clean clothes to the hospital/surgery center.   Remember to brush your teeth  WITH YOUR REGULAR TOOTHPASTE.    Please read over the following fact sheets that you were given. Coughing and Deep Breathing and Surgical Site Infection Prevention

## 2018-04-14 ENCOUNTER — Encounter (HOSPITAL_COMMUNITY)
Admission: RE | Admit: 2018-04-14 | Discharge: 2018-04-14 | Disposition: A | Discharge status: Home / Self Care | Payer: Medicare Other | Source: Ambulatory Visit | Attending: Surgery | Admitting: Surgery

## 2018-04-14 ENCOUNTER — Encounter (HOSPITAL_COMMUNITY): Payer: Self-pay

## 2018-04-14 ENCOUNTER — Other Ambulatory Visit: Payer: Self-pay

## 2018-04-14 DIAGNOSIS — Z01818 Encounter for other preprocedural examination: Secondary | ICD-10-CM | POA: Diagnosis not present

## 2018-04-14 DIAGNOSIS — R9431 Abnormal electrocardiogram [ECG] [EKG]: Secondary | ICD-10-CM | POA: Diagnosis not present

## 2018-04-14 DIAGNOSIS — K4090 Unilateral inguinal hernia, without obstruction or gangrene, not specified as recurrent: Secondary | ICD-10-CM | POA: Insufficient documentation

## 2018-04-14 DIAGNOSIS — Z01812 Encounter for preprocedural laboratory examination: Secondary | ICD-10-CM | POA: Insufficient documentation

## 2018-04-14 HISTORY — DX: Essential (primary) hypertension: I10

## 2018-04-14 LAB — COMPREHENSIVE METABOLIC PANEL
ALBUMIN: 3.9 g/dL (ref 3.5–5.0)
ALT: 20 U/L (ref 0–44)
AST: 17 U/L (ref 15–41)
Alkaline Phosphatase: 64 U/L (ref 38–126)
Anion gap: 9 (ref 5–15)
BILIRUBIN TOTAL: 0.6 mg/dL (ref 0.3–1.2)
BUN: 15 mg/dL (ref 8–23)
CO2: 27 mmol/L (ref 22–32)
Calcium: 9.2 mg/dL (ref 8.9–10.3)
Chloride: 106 mmol/L (ref 98–111)
Creatinine, Ser: 1.05 mg/dL (ref 0.61–1.24)
GFR calc Af Amer: >60 mL/min (ref >60)
GFR calc non Af Amer: >60 mL/min (ref >60)
GLUCOSE: 121 mg/dL — AB (ref 70–99)
POTASSIUM: 4.1 mmol/L (ref 3.5–5.1)
SODIUM: 142 mmol/L (ref 135–145)
TOTAL PROTEIN: 6.7 g/dL (ref 6.5–8.1)

## 2018-04-14 LAB — CBC WITH DIFFERENTIAL/PLATELET
Abs Immature Granulocytes: 0 10*3/uL (ref 0.0–0.1)
Basophils Absolute: 0 10*3/uL (ref 0.0–0.1)
Basophils Relative: 0 %
Eosinophils Absolute: 0.4 10*3/uL (ref 0.0–0.7)
Eosinophils Relative: 4 %
HEMATOCRIT: 44.8 % (ref 39.0–52.0)
HEMOGLOBIN: 14.3 g/dL (ref 13.0–17.0)
IMMATURE GRANULOCYTES: 0 %
LYMPHS ABS: 2.1 10*3/uL (ref 0.7–4.0)
Lymphocytes Relative: 22 %
MCH: 31.1 pg (ref 26.0–34.0)
MCHC: 31.9 g/dL (ref 30.0–36.0)
MCV: 97.4 fL (ref 78.0–100.0)
Monocytes Absolute: 0.8 10*3/uL (ref 0.1–1.0)
Monocytes Relative: 8 %
NEUTROS PCT: 66 %
Neutro Abs: 6.5 10*3/uL (ref 1.7–7.7)
Platelets: 345 10*3/uL (ref 150–400)
RBC: 4.6 MIL/uL (ref 4.22–5.81)
RDW: 13 % (ref 11.5–15.5)
WBC: 9.8 10*3/uL (ref 4.0–10.5)

## 2018-04-14 MED ORDER — CHLORHEXIDINE GLUCONATE CLOTH 2 % EX PADS: 6.0000 | MEDICATED_PAD | Freq: Once | CUTANEOUS | Status: DC

## 2018-04-14 NOTE — Progress Notes (Signed)
PCP: Gaye Alken, MD  Cardiologist: Kirk Ruths, MD  EKG: pt denies past year  Stress test: pt denies  ECHO: 06/15/16 in EPIC  Cardiac Cath: 06/13/16 in EPIC  Chest x-ray: pt denies past year, no recent respiratory infections/complications

## 2018-04-20 MED ORDER — CEFAZOLIN SODIUM-DEXTROSE 2-4 GM/100ML-% IV SOLN
2.0000 g | INTRAVENOUS | Status: AC
  Administered 2018-04-21: 2 g via INTRAVENOUS
  Filled 2018-04-20: qty 100

## 2018-04-21 ENCOUNTER — Ambulatory Visit (HOSPITAL_COMMUNITY): Payer: Medicare Other | Admitting: Anesthesiology

## 2018-04-21 ENCOUNTER — Encounter (HOSPITAL_COMMUNITY): Payer: Self-pay | Admitting: *Deleted

## 2018-04-21 ENCOUNTER — Ambulatory Visit (HOSPITAL_COMMUNITY)
Admission: RE | Admit: 2018-04-21 | Discharge: 2018-04-21 | Disposition: A | Discharge status: Home / Self Care | Payer: Medicare Other | Source: Ambulatory Visit | Attending: Surgery | Admitting: Surgery

## 2018-04-21 ENCOUNTER — Encounter (HOSPITAL_COMMUNITY)
Admission: RE | Disposition: A | Discharge status: Home / Self Care | Payer: Self-pay | Source: Ambulatory Visit | Attending: Surgery

## 2018-04-21 DIAGNOSIS — I10 Essential (primary) hypertension: Secondary | ICD-10-CM | POA: Diagnosis not present

## 2018-04-21 DIAGNOSIS — I2510 Atherosclerotic heart disease of native coronary artery without angina pectoris: Secondary | ICD-10-CM | POA: Insufficient documentation

## 2018-04-21 DIAGNOSIS — K4090 Unilateral inguinal hernia, without obstruction or gangrene, not specified as recurrent: Secondary | ICD-10-CM | POA: Insufficient documentation

## 2018-04-21 DIAGNOSIS — I119 Hypertensive heart disease without heart failure: Secondary | ICD-10-CM | POA: Diagnosis not present

## 2018-04-21 DIAGNOSIS — J449 Chronic obstructive pulmonary disease, unspecified: Secondary | ICD-10-CM | POA: Diagnosis not present

## 2018-04-21 DIAGNOSIS — Z79899 Other long term (current) drug therapy: Secondary | ICD-10-CM | POA: Diagnosis not present

## 2018-04-21 DIAGNOSIS — F17200 Nicotine dependence, unspecified, uncomplicated: Secondary | ICD-10-CM | POA: Insufficient documentation

## 2018-04-21 DIAGNOSIS — G8918 Other acute postprocedural pain: Secondary | ICD-10-CM | POA: Diagnosis not present

## 2018-04-21 HISTORY — PX: INGUINAL HERNIA REPAIR: SHX194

## 2018-04-21 HISTORY — PX: INSERTION OF MESH: SHX5868

## 2018-04-21 SURGERY — REPAIR, HERNIA, INGUINAL, ADULT
Anesthesia: General | Site: Abdomen | Laterality: Right

## 2018-04-21 MED ORDER — DEXAMETHASONE SODIUM PHOSPHATE 10 MG/ML IJ SOLN
INTRAMUSCULAR | Status: DC | PRN
  Administered 2018-04-21: 10 mg via INTRAVENOUS

## 2018-04-21 MED ORDER — PROMETHAZINE HCL 25 MG/ML IJ SOLN: 6.2500 mg | INTRAMUSCULAR | Status: DC | PRN

## 2018-04-21 MED ORDER — GABAPENTIN 300 MG PO CAPS
300.0000 mg | ORAL_CAPSULE | ORAL | Status: AC
  Administered 2018-04-21: 300 mg via ORAL
  Filled 2018-04-21: qty 1

## 2018-04-21 MED ORDER — FENTANYL CITRATE (PF) 100 MCG/2ML IJ SOLN: 100.0000 ug | Freq: Once | INTRAMUSCULAR | Status: DC

## 2018-04-21 MED ORDER — 0.9 % SODIUM CHLORIDE (POUR BTL) OPTIME
TOPICAL | Status: DC | PRN
  Administered 2018-04-21: 1000 mL

## 2018-04-21 MED ORDER — ALBUTEROL SULFATE (2.5 MG/3ML) 0.083% IN NEBU
2.5000 mg | INHALATION_SOLUTION | Freq: Four times a day (QID) | RESPIRATORY_TRACT | Status: DC | PRN
  Administered 2018-04-21: 2.5 mg via RESPIRATORY_TRACT

## 2018-04-21 MED ORDER — OXYCODONE HCL 5 MG PO TABS: 5.0000 mg | ORAL_TABLET | Freq: Four times a day (QID) | ORAL | 0 refills | Status: DC | PRN

## 2018-04-21 MED ORDER — EPHEDRINE SULFATE 50 MG/ML IJ SOLN
INTRAMUSCULAR | Status: DC | PRN
  Administered 2018-04-21: 10 mg via INTRAVENOUS
  Administered 2018-04-21: 5 mg via INTRAVENOUS
  Administered 2018-04-21: 10 mg via INTRAVENOUS

## 2018-04-21 MED ORDER — MEPERIDINE HCL 50 MG/ML IJ SOLN: 6.2500 mg | INTRAMUSCULAR | Status: DC | PRN

## 2018-04-21 MED ORDER — ONDANSETRON HCL 4 MG/2ML IJ SOLN
INTRAMUSCULAR | Status: DC | PRN
  Administered 2018-04-21: 4 mg via INTRAVENOUS

## 2018-04-21 MED ORDER — LIDOCAINE HCL (CARDIAC) PF 100 MG/5ML IV SOSY
PREFILLED_SYRINGE | INTRAVENOUS | Status: DC | PRN
  Administered 2018-04-21: 50 mg via INTRAVENOUS

## 2018-04-21 MED ORDER — SUGAMMADEX SODIUM 200 MG/2ML IV SOLN
INTRAVENOUS | Status: DC | PRN
  Administered 2018-04-21: 200 mg via INTRAVENOUS

## 2018-04-21 MED ORDER — FENTANYL CITRATE (PF) 100 MCG/2ML IJ SOLN
INTRAMUSCULAR | Status: AC
  Administered 2018-04-21: 100 ug
  Filled 2018-04-21: qty 2

## 2018-04-21 MED ORDER — BUPIVACAINE-EPINEPHRINE 0.25% -1:200000 IJ SOLN
INTRAMUSCULAR | Status: DC | PRN
  Administered 2018-04-21: 10 mL

## 2018-04-21 MED ORDER — DEXTROSE 5 % IV SOLN
INTRAVENOUS | Status: DC | PRN
  Administered 2018-04-21: 50 ug/min via INTRAVENOUS

## 2018-04-21 MED ORDER — CELECOXIB 200 MG PO CAPS
400.0000 mg | ORAL_CAPSULE | ORAL | Status: AC
  Administered 2018-04-21: 400 mg via ORAL
  Filled 2018-04-21: qty 2

## 2018-04-21 MED ORDER — FENTANYL CITRATE (PF) 100 MCG/2ML IJ SOLN: 25.0000 ug | INTRAMUSCULAR | Status: DC | PRN

## 2018-04-21 MED ORDER — PHENYLEPHRINE HCL 10 MG/ML IJ SOLN
INTRAMUSCULAR | Status: DC | PRN
  Administered 2018-04-21: 40 ug via INTRAVENOUS
  Administered 2018-04-21 (×2): 80 ug via INTRAVENOUS

## 2018-04-21 MED ORDER — ACETAMINOPHEN 500 MG PO TABS
1000.0000 mg | ORAL_TABLET | ORAL | Status: AC
  Administered 2018-04-21: 1000 mg via ORAL
  Filled 2018-04-21: qty 2

## 2018-04-21 MED ORDER — ROCURONIUM BROMIDE 100 MG/10ML IV SOLN
INTRAVENOUS | Status: DC | PRN
  Administered 2018-04-21: 50 mg via INTRAVENOUS

## 2018-04-21 MED ORDER — ALBUTEROL SULFATE (2.5 MG/3ML) 0.083% IN NEBU
INHALATION_SOLUTION | RESPIRATORY_TRACT | Status: AC
  Filled 2018-04-21: qty 3

## 2018-04-21 MED ORDER — FENTANYL CITRATE (PF) 250 MCG/5ML IJ SOLN
INTRAMUSCULAR | Status: AC
  Filled 2018-04-21: qty 5

## 2018-04-21 MED ORDER — PROPOFOL 10 MG/ML IV BOLUS
INTRAVENOUS | Status: AC
  Filled 2018-04-21: qty 20

## 2018-04-21 MED ORDER — PROPOFOL 10 MG/ML IV BOLUS
INTRAVENOUS | Status: DC | PRN
  Administered 2018-04-21: 100 mg via INTRAVENOUS

## 2018-04-21 MED ORDER — LACTATED RINGERS IV SOLN
INTRAVENOUS | Status: DC | PRN
  Administered 2018-04-21 (×2): via INTRAVENOUS

## 2018-04-21 MED ORDER — IBUPROFEN 800 MG PO TABS: 800.0000 mg | ORAL_TABLET | Freq: Three times a day (TID) | ORAL | 0 refills | Status: DC | PRN

## 2018-04-21 MED ORDER — BUPIVACAINE-EPINEPHRINE (PF) 0.25% -1:200000 IJ SOLN
INTRAMUSCULAR | Status: AC
  Filled 2018-04-21: qty 30

## 2018-04-21 MED ORDER — ROPIVACAINE HCL 7.5 MG/ML IJ SOLN
INTRAMUSCULAR | Status: DC | PRN
  Administered 2018-04-21: 30 mL via PERINEURAL

## 2018-04-21 MED ORDER — MIDAZOLAM HCL 2 MG/2ML IJ SOLN
INTRAMUSCULAR | Status: AC
  Administered 2018-04-21: 2 mg
  Filled 2018-04-21: qty 2

## 2018-04-21 MED ORDER — LACTATED RINGERS IV SOLN: INTRAVENOUS | Status: DC

## 2018-04-21 MED ORDER — MIDAZOLAM HCL 2 MG/2ML IJ SOLN: 2.0000 mg | Freq: Once | INTRAMUSCULAR | Status: DC

## 2018-04-21 MED ORDER — FENTANYL CITRATE (PF) 100 MCG/2ML IJ SOLN
INTRAMUSCULAR | Status: DC | PRN
  Administered 2018-04-21: 50 ug via INTRAVENOUS

## 2018-04-21 MED ORDER — LACTATED RINGERS IV SOLN
INTRAVENOUS | Status: DC
  Administered 2018-04-21: 09:00:00 via INTRAVENOUS

## 2018-04-21 SURGICAL SUPPLY — 42 items
ADH SKN CLS APL DERMABOND .7 (GAUZE/BANDAGES/DRESSINGS) ×1
BLADE CLIPPER SURG (BLADE) IMPLANT
CANISTER SUCT 3000ML PPV (MISCELLANEOUS) IMPLANT
CHLORAPREP W/TINT 26ML (MISCELLANEOUS) ×3 IMPLANT
COVER SURGICAL LIGHT HANDLE (MISCELLANEOUS) ×3 IMPLANT
DERMABOND ADVANCED (GAUZE/BANDAGES/DRESSINGS) ×2
DERMABOND ADVANCED .7 DNX12 (GAUZE/BANDAGES/DRESSINGS) ×1 IMPLANT
DRAIN PENROSE 1/2X12 LTX STRL (WOUND CARE) ×2 IMPLANT
DRAPE LAPAROTOMY TRNSV 102X78 (DRAPE) ×6 IMPLANT
DRAPE UTILITY XL STRL (DRAPES) ×6 IMPLANT
ELECT CAUTERY BLADE 6.4 (BLADE) ×3 IMPLANT
ELECT REM PT RETURN 9FT ADLT (ELECTROSURGICAL) ×3
ELECTRODE REM PT RTRN 9FT ADLT (ELECTROSURGICAL) ×1 IMPLANT
GLOVE BIO SURGEON STRL SZ8 (GLOVE) ×3 IMPLANT
GLOVE BIOGEL PI IND STRL 8 (GLOVE) ×1 IMPLANT
GLOVE BIOGEL PI INDICATOR 8 (GLOVE) ×2
GOWN STRL REUS W/ TWL LRG LVL3 (GOWN DISPOSABLE) ×1 IMPLANT
GOWN STRL REUS W/ TWL XL LVL3 (GOWN DISPOSABLE) ×1 IMPLANT
GOWN STRL REUS W/TWL LRG LVL3 (GOWN DISPOSABLE) ×3
GOWN STRL REUS W/TWL XL LVL3 (GOWN DISPOSABLE) ×3
KIT BASIN OR (CUSTOM PROCEDURE TRAY) ×3 IMPLANT
KIT TURNOVER KIT B (KITS) ×3 IMPLANT
MESH HERNIA SYS ULTRAPRO LRG (Mesh General) ×2 IMPLANT
NDL HYPO 25GX1X1/2 BEV (NEEDLE) ×1 IMPLANT
NEEDLE HYPO 25GX1X1/2 BEV (NEEDLE) ×3 IMPLANT
NS IRRIG 1000ML POUR BTL (IV SOLUTION) ×3 IMPLANT
PACK GENERAL/GYN (CUSTOM PROCEDURE TRAY) ×3 IMPLANT
PAD ARMBOARD 7.5X6 YLW CONV (MISCELLANEOUS) ×3 IMPLANT
PENCIL BUTTON HOLSTER BLD 10FT (ELECTRODE) ×3 IMPLANT
SPONGE LAP 18X18 X RAY DECT (DISPOSABLE) ×3 IMPLANT
SUT MNCRL AB 4-0 PS2 18 (SUTURE) ×3 IMPLANT
SUT NOVA NAB DX-16 0-1 5-0 T12 (SUTURE) ×6 IMPLANT
SUT SILK 2 0 SH (SUTURE) IMPLANT
SUT VIC AB 0 CT1 27 (SUTURE)
SUT VIC AB 0 CT1 27XBRD ANBCTR (SUTURE) IMPLANT
SUT VIC AB 2-0 SH 27 (SUTURE) ×3
SUT VIC AB 2-0 SH 27X BRD (SUTURE) ×1 IMPLANT
SUT VIC AB 3-0 SH 18 (SUTURE) ×3 IMPLANT
SUT VICRYL AB 3 0 TIES (SUTURE) ×3 IMPLANT
TOWEL OR 17X24 6PK STRL BLUE (TOWEL DISPOSABLE) ×3 IMPLANT
TOWEL OR 17X26 10 PK STRL BLUE (TOWEL DISPOSABLE) ×3 IMPLANT
YANKAUER SUCT BULB TIP NO VENT (SUCTIONS) IMPLANT

## 2018-04-21 NOTE — Interval H&P Note (Signed)
History and Physical Interval Note:  04/21/2018 10:33 AM  [name]  has presented today for surgery, with the diagnosis of right inguinal hernia  The various methods of treatment have been discussed with the patient and family. After consideration of risks, benefits and other options for treatment, the patient has consented to  Procedure(s) with comments: [place] (Right) - TAP BLOCK INSERTION OF MESH (Right) as a surgical intervention .  The patient's history has been reviewed, patient examined, no change in status, stable for surgery.  I have reviewed the patient's chart and labs.  Questions were answered to the patient's satisfaction.     [place]

## 2018-04-21 NOTE — Op Note (Signed)
RIGHT inguinal Hernia, Open, Procedure Note mesh   Indications: The patient presented with a history of a right inguinal , reducible hernia.  The risk of hernia repair include bleeding,  Infection,   Recurrence of the hernia,  Mesh use, chronic pain,  Organ injury,  Bowel injury,  Bladder injury,   nerve injury with numbness around the incision,  Death,  and worsening of preexisting  medical problems.  The alternatives to surgery have been discussed as well..  Long term expectations of both operative and non operative treatments have been discussed.   The patient agrees to proceed.  Pre-operative Diagnosis: right reducible inguinal hernia   Post-operative Diagnosis: same  Surgeon: Turner Daniels  MD   Assistants: none   Anesthesia: General endotracheal anesthesia and Local anesthesia 0.25.% bupivacaine, with epinephrine and TAP   ASA Class: 3  Procedure Details  The patient was seen again in the Holding Room. The risks, benefits, complications, treatment options, and expected outcomes were discussed with the patient. The possibilities of reaction to medication, pulmonary aspiration, perforation of viscus, bleeding, recurrent infection, the need for additional procedures, and development of a complication requiring transfusion or further operation were discussed with the patient and/or family. There was concurrence with the proposed plan, and informed consent was obtained. The site of surgery was properly noted/marked. The patient was taken to the Operating Room, identified as Gary Wright, and the procedure verified as hernia repair. A Time Out was held and the above information confirmed.  The patient was placed in the supine position and underwent induction of anesthesia, the lower abdomen and groin was prepped and draped in the standard fashion, and 0.25% Marcaine with epinephrine was used to anesthetize the skin over the mid-portion of the inguinal canal. A transverse incision was made.  Dissection was carried through the soft tissue to expose the inguinal canal and inguinal ligament along its lower edge. The external oblique fascia was split along the course of its fibers, exposing the inguinal canal. The cord and nerve were looped using a Penrose drain and reflected out of the field. The defect was exposed and a piece of prolene hernia system ultrapro mesh was and placed over the defect. Interupted 2-0 novafil suture was then used  to repair the defect, with the suture being sewn from the pubic tubercle inferiorly and superiorly along the canal to a level just beyond the internal ring. The mesh was split to allow passage of the cord and  into the canal without entrapment.  The ilioinguinal nerve was divided to prevent entrapment  The contents were then returned to canal and the external oblique fashion was then closed in a continuous fashion using 3-0 Vicryl suture taking care not to cause entrapment. Scarpa's layer closed with 3 0 vicryl and 4 0 monocryl used to close the skin.  Dermabond used for dressing.  Instrument, sponge, and needle counts were correct prior to closure and at the conclusion of the case.  Findings: Hernia as above  Estimated Blood Loss: Minimal         Drains: None         Total IV Fluids: per anesthesia record          Specimens: none                Complications: None; patient tolerated the procedure well.         Disposition: PACU - hemodynamically stable.         Condition: stable

## 2018-04-21 NOTE — Discharge Instructions (Signed)
[facility] _______[facility] ° °UMBILICAL OR INGUINAL HERNIA REPAIR: POST OP INSTRUCTIONS ° °Always review your discharge instruction sheet given to you by the facility where your surgery was performed. °IF YOU HAVE DISABILITY OR FAMILY LEAVE FORMS, YOU MUST BRING THEM TO THE OFFICE FOR PROCESSING.   °DO NOT GIVE THEM TO YOUR DOCTOR. ° °1. A  prescription for pain medication may be given to you upon discharge.  Take your pain medication as prescribed, if needed.  If narcotic pain medicine is not needed, then you may take acetaminophen (Tylenol) or ibuprofen (Advil) as needed. °2. Take your usually prescribed medications unless otherwise directed. °If you need a refill on your pain medication, please contact your pharmacy.  They will contact our office to request authorization. Prescriptions will not be filled after 5 pm or on week-ends. °3. You should follow a light diet the first 24 hours after arrival home, such as soup and crackers, etc.  Be sure to include lots of fluids daily.  Resume your normal diet the day after surgery. °4.Most patients will experience some swelling and bruising around the umbilicus or in the groin and scrotum.  Ice packs and reclining will help.  Swelling and bruising can take several days to resolve.  °6. It is common to experience some constipation if taking pain medication after surgery.  Increasing fluid intake and taking a stool softener (such as Colace) will usually help or prevent this problem from occurring.  A mild laxative (Milk of Magnesia or Miralax) should be taken according to package directions if there are no bowel movements after 48 hours. °7. Unless discharge instructions indicate otherwise, you may remove your bandages 24-48 hours after surgery, and you may shower at that time.  You may have steri-strips (small skin tapes) in place directly over the incision.  These strips should be left on the skin for 7-10 days.  If your surgeon used skin glue on the  incision, you may shower in 24 hours.  The glue will flake off over the next 2-3 weeks.  Any sutures or staples will be removed at the office during your follow-up visit. °8. ACTIVITIES:  You may resume regular (light) daily activities beginning the next day--such as daily self-care, walking, climbing stairs--gradually increasing activities as tolerated.  You may have sexual intercourse when it is comfortable.  Refrain from any heavy lifting or straining until approved by your doctor. ° °a.You may drive when you are no longer taking prescription pain medication, you can comfortably wear a seatbelt, and you can safely maneuver your car and apply brakes. °b.RETURN TO WORK:   °_____________________________________________ ° °9.You should see your doctor in the office for a follow-up appointment approximately 2-3 weeks after your surgery.  Make sure that you call for this appointment within a day or two after you arrive home to insure a convenient appointment time. °10.OTHER INSTRUCTIONS: _________________________ °   _____________________________________ ° °WHEN TO CALL YOUR DOCTOR: °1. Fever over 101.0 °2. Inability to urinate °3. Nausea and/or vomiting °4. Extreme swelling or bruising °5. Continued bleeding from incision. °6. Increased pain, redness, or drainage from the incision ° °The clinic staff is available to answer your questions during regular business hours.  Please don’t hesitate to call and ask to speak to one of the nurses for clinical concerns.  If you have a medical emergency, go to the nearest emergency room or call 911.  A surgeon from [facility] is always on call at the hospital ° ° °  [address] [place], [place], [place]  [address] ?  [address], [place], [place]   [id number] [phone] ? [phone] ? FAX [phone] Web site: [email or web address]   Post Anesthesia Home Care Instructions  Activity: Get plenty of rest for the remainder of the day. A  responsible individual must stay with you for 24 hours following the procedure.  For the next 24 hours, DO NOT: -Drive a car -Paediatric nurse -Drink alcoholic beverages -Take any medication unless instructed by your physician -Make any legal decisions or sign important papers.  Meals: Start with liquid foods such as gelatin or soup. Progress to regular foods as tolerated. Avoid greasy, spicy, heavy foods. If nausea and/or vomiting occur, drink only clear liquids until the nausea and/or vomiting subsides. Call your physician if vomiting continues.  Special Instructions/Symptoms: Your throat may feel dry or sore from the anesthesia or the breathing tube placed in your throat during surgery. If this causes discomfort, gargle with warm salt water. The discomfort should disappear within 24 hours.  If you had a scopolamine patch placed behind your ear for the management of post- operative nausea and/or vomiting:  1. The medication in the patch is effective for 72 hours, after which it should be removed.  Wrap patch in a tissue and discard in the trash. Wash hands thoroughly with soap and water. 2. You may remove the patch earlier than 72 hours if you experience unpleasant side effects which may include dry mouth, dizziness or visual disturbances. 3. Avoid touching the patch. Wash your hands with soap and water after contact with the patch.

## 2018-04-21 NOTE — Anesthesia Procedure Notes (Signed)
Procedure Name: Intubation Date/Time: 04/21/2018 11:06 AM Performed by: Neldon Newport, CRNA Pre-anesthesia Checklist: Timeout performed, Patient being monitored, Suction available, Emergency Drugs available and Patient identified Patient Re-evaluated:Patient Re-evaluated prior to induction Oxygen Delivery Method: Circle system utilized Preoxygenation: Pre-oxygenation with 100% oxygen Induction Type: IV induction Ventilation: Mask ventilation without difficulty Laryngoscope Size: Mac and 4 Grade View: Grade I Tube type: Oral Tube size: 7.5 mm Number of attempts: 1 Placement Confirmation: breath sounds checked- equal and bilateral,  positive ETCO2 and ETT inserted through vocal cords under direct vision Secured at: 23 cm Tube secured with: Tape Dental Injury: Teeth and Oropharynx as per pre-operative assessment

## 2018-04-21 NOTE — Anesthesia Procedure Notes (Signed)
Anesthesia Regional Block: TAP block   Pre-Anesthetic Checklist: ,, timeout performed, Correct Patient, Correct Site, Correct Laterality, Correct Procedure, Correct Position, site marked, Risks and benefits discussed,  Surgical consent,  Pre-op evaluation,  At surgeon's request and post-op pain management  Laterality: Right  Prep: chloraprep       Needles:  Injection technique: Single-shot  Needle Type: Echogenic Stimulator Needle     Needle Length: 5cm  Needle Gauge: 22     Additional Needles:   Procedures:, nerve stimulator,,, ultrasound used (permanent image in chart),,,,  Narrative:  Start time: 04/21/2018 9:25 AM End time: 04/21/2018 9:30 AM Injection made incrementally with aspirations every 5 mL.  Performed by: Personally  Anesthesiologist: Janeece Riggers, MD  Additional Notes: Functioning IV was confirmed and monitors were applied.  A 40mm 22ga Arrow echogenic stimulator needle was used. Sterile prep and drape,hand hygiene and sterile gloves were used. Ultrasound guidance: relevant anatomy identified, needle position confirmed, local anesthetic spread visualized around nerve(s)., vascular puncture avoided.  Image printed for medical record. Negative aspiration and negative test dose prior to incremental administration of local anesthetic. The patient tolerated the procedure well.

## 2018-04-21 NOTE — Anesthesia Postprocedure Evaluation (Signed)
Anesthesia Post Note  Patient: Gary Wright  Procedure(s) Performed: RIGHT INGUINAL HERNIA REPAIR ERAS PATHWAY (Right Abdomen) INSERTION OF MESH (Right Abdomen)     Patient location during evaluation: PACU Anesthesia Type: General Level of consciousness: awake and alert, oriented and patient cooperative Pain management: pain level controlled Vital Signs Assessment: post-procedure vital signs reviewed and stable Respiratory status: spontaneous breathing, nonlabored ventilation and respiratory function stable Cardiovascular status: blood pressure returned to baseline and stable Postop Assessment: no apparent nausea or vomiting Anesthetic complications: no    Last Vitals:  Vitals:   04/21/18 1320 04/21/18 1330  BP: 104/65 112/63  Pulse: 77 79  Resp: 12 18  Temp:  36.5 C  SpO2: 95% 93%    Last Pain:  Vitals:   04/21/18 1320  TempSrc:   PainSc: 0-No pain                 Ajax Schroll,E. Jaymon Dudek

## 2018-04-21 NOTE — Transfer of Care (Signed)
Immediate Anesthesia Transfer of Care Note  Patient: Gary Wright  Procedure(s) Performed: RIGHT INGUINAL HERNIA REPAIR ERAS PATHWAY (Right Abdomen) INSERTION OF MESH (Right Abdomen)  Patient Location: PACU  Anesthesia Type:General  Level of Consciousness: sedated  Airway & Oxygen Therapy: Patient Spontanous Breathing and Patient connected to nasal cannula oxygen  Post-op Assessment: Report given to RN, Post -op Vital signs reviewed and stable and Patient moving all extremities X 4  Post vital signs: Reviewed and stable  Last Vitals:  Vitals Value Taken Time  BP 121/63 04/21/2018 12:21 PM  Temp    Pulse 73 04/21/2018 12:21 PM  Resp 12 04/21/2018 12:21 PM  SpO2 96 % 04/21/2018 12:21 PM  Vitals shown include unvalidated device data.  Last Pain:  Vitals:   04/21/18 0853  TempSrc:   PainSc: 0-No pain         Complications: No apparent anesthesia complications

## 2018-04-21 NOTE — Anesthesia Preprocedure Evaluation (Addendum)
Anesthesia Evaluation  Patient identified by MRN, date of birth, ID band Patient awake    Reviewed: Allergy & Precautions, H&P , NPO status , Patient's Chart, lab work & pertinent test results, reviewed documented beta blocker date and time   Airway Mallampati: II  TM Distance: >3 FB Neck ROM: full    Dental no notable dental hx. (+) Edentulous Upper, Poor Dentition, Chipped, Loose, Missing, Dental Advidsory Given,    Pulmonary COPD, Current Smoker,    Pulmonary exam normal breath sounds clear to auscultation       Cardiovascular Exercise Tolerance: Good hypertension, Pt. on medications and Pt. on home beta blockers + CAD  negative cardio ROS   Rhythm:regular Rate:Normal  ECHO 17 Study Conclusions  - Left ventricle: The cavity size was normal. Systolic function was   normal. The estimated ejection fraction was in the range of 50%   to 55%. Wall motion was normal; there were no regional wall   motion abnormalities. - Right ventricle: The cavity size was dilated.    Neuro/Psych Depression negative neurological ROS     GI/Hepatic negative GI ROS, Neg liver ROS,   Endo/Other  negative endocrine ROS  Renal/GU negative Renal ROS     Musculoskeletal   Abdominal   Peds  Hematology negative hematology ROS (+)   Anesthesia Other Findings   Reproductive/Obstetrics negative OB ROS                           Anesthesia Physical Anesthesia Plan  ASA: II  Anesthesia Plan: General   Post-op Pain Management:    Induction:   PONV Risk Score and Plan:   Airway Management Planned:   Additional Equipment:   Intra-op Plan:   Post-operative Plan:   Informed Consent: I have reviewed the patients History and Physical, chart, labs and discussed the procedure including the risks, benefits and alternatives for the proposed anesthesia with the patient or authorized representative who has  indicated his/her understanding and acceptance.   Dental Advisory Given  Plan Discussed with: CRNA  Anesthesia Plan Comments:         Anesthesia Quick Evaluation

## 2018-04-22 ENCOUNTER — Encounter (HOSPITAL_COMMUNITY): Payer: Self-pay | Admitting: Surgery

## 2018-07-15 ENCOUNTER — Other Ambulatory Visit: Payer: Self-pay | Admitting: Cardiology

## 2018-07-15 NOTE — Telephone Encounter (Signed)
Rx request sent to pharmacy.  

## 2018-08-28 DIAGNOSIS — J449 Chronic obstructive pulmonary disease, unspecified: Secondary | ICD-10-CM | POA: Diagnosis not present

## 2018-08-28 DIAGNOSIS — Z6827 Body mass index (BMI) 27.0-27.9, adult: Secondary | ICD-10-CM | POA: Diagnosis not present

## 2018-08-28 DIAGNOSIS — R0602 Shortness of breath: Secondary | ICD-10-CM | POA: Diagnosis not present

## 2018-08-28 DIAGNOSIS — Z23 Encounter for immunization: Secondary | ICD-10-CM | POA: Diagnosis not present

## 2018-09-09 DIAGNOSIS — Z6827 Body mass index (BMI) 27.0-27.9, adult: Secondary | ICD-10-CM | POA: Diagnosis not present

## 2018-09-09 DIAGNOSIS — J449 Chronic obstructive pulmonary disease, unspecified: Secondary | ICD-10-CM | POA: Diagnosis not present

## 2018-09-09 DIAGNOSIS — Z87891 Personal history of nicotine dependence: Secondary | ICD-10-CM | POA: Diagnosis not present

## 2018-09-23 DIAGNOSIS — Z87891 Personal history of nicotine dependence: Secondary | ICD-10-CM | POA: Diagnosis not present

## 2018-09-30 DIAGNOSIS — J449 Chronic obstructive pulmonary disease, unspecified: Secondary | ICD-10-CM | POA: Diagnosis not present

## 2018-09-30 DIAGNOSIS — F17210 Nicotine dependence, cigarettes, uncomplicated: Secondary | ICD-10-CM | POA: Diagnosis not present

## 2018-09-30 DIAGNOSIS — R918 Other nonspecific abnormal finding of lung field: Secondary | ICD-10-CM | POA: Diagnosis not present

## 2018-09-30 DIAGNOSIS — F329 Major depressive disorder, single episode, unspecified: Secondary | ICD-10-CM | POA: Diagnosis not present

## 2018-09-30 DIAGNOSIS — Z87891 Personal history of nicotine dependence: Secondary | ICD-10-CM | POA: Diagnosis not present

## 2018-10-05 DIAGNOSIS — R911 Solitary pulmonary nodule: Secondary | ICD-10-CM | POA: Diagnosis not present

## 2018-10-07 DIAGNOSIS — J449 Chronic obstructive pulmonary disease, unspecified: Secondary | ICD-10-CM | POA: Diagnosis not present

## 2018-10-07 DIAGNOSIS — R918 Other nonspecific abnormal finding of lung field: Secondary | ICD-10-CM | POA: Diagnosis not present

## 2018-10-07 DIAGNOSIS — Z6827 Body mass index (BMI) 27.0-27.9, adult: Secondary | ICD-10-CM | POA: Diagnosis not present

## 2018-10-12 DIAGNOSIS — C3490 Malignant neoplasm of unspecified part of unspecified bronchus or lung: Secondary | ICD-10-CM | POA: Diagnosis not present

## 2018-10-13 DIAGNOSIS — Z87891 Personal history of nicotine dependence: Secondary | ICD-10-CM | POA: Diagnosis not present

## 2018-10-13 DIAGNOSIS — R911 Solitary pulmonary nodule: Secondary | ICD-10-CM | POA: Diagnosis not present

## 2018-10-13 DIAGNOSIS — J449 Chronic obstructive pulmonary disease, unspecified: Secondary | ICD-10-CM | POA: Diagnosis not present

## 2018-10-15 DIAGNOSIS — I209 Angina pectoris, unspecified: Secondary | ICD-10-CM | POA: Diagnosis not present

## 2018-10-15 DIAGNOSIS — R911 Solitary pulmonary nodule: Secondary | ICD-10-CM | POA: Diagnosis not present

## 2018-10-18 ENCOUNTER — Other Ambulatory Visit: Payer: Self-pay | Admitting: Cardiology

## 2018-11-02 ENCOUNTER — Other Ambulatory Visit: Payer: Self-pay | Admitting: Cardiology

## 2018-11-30 NOTE — Progress Notes (Signed)
HPI: FU coronary artery disease. Patientpreviously admitted with a non-ST elevation myocardial infarction. Cardiac catheterization 06/13/2016 showed mild diffuse nonobstructive coronary disease. Ejection fraction was 25-30% and felt consistent withtako tsubo CM. Echocardiogram 2 days later showed normal LV function and mild right ventricular enlargement. Abdominal ultrasound February 2019 showed no evidence of abdominal aortic aneurysm with largest measurement being 2.6 cm.  This is decreased from previous exam of 3.1 cm.  Since last seen  he was diagnosed with lung cancer and had a recent resection at [facility].  He has some dyspnea on exertion but no orthopnea, PND, pedal edema or chest pain.  He describes some dizziness with standing.  Current Outpatient Medications  Medication Sig Dispense Refill  . albuterol (PROVENTIL HFA;VENTOLIN HFA) 108 (90 Base) MCG/ACT inhaler Inhale 2 puffs into the lungs every 6 (six) hours.     Marland Kitchen aspirin 81 MG chewable tablet CHEW AND SWALLOW 1 TABLET(81 MG) BY MOUTH DAILY 30 tablet 2  . atorvastatin (LIPITOR) 40 MG tablet TAKE 1 TABLET BY MOUTH DAILY AT 6 PM 90 tablet 3  . budesonide-formoterol (SYMBICORT) 160-4.5 MCG/ACT inhaler Inhale 2 puffs into the lungs 2 (two) times daily.    Marland Kitchen buPROPion (WELLBUTRIN XL) 300 MG 24 hr tablet Take 300 mg by mouth daily.  1  . citalopram (CELEXA) 40 MG tablet Take 40 mg by mouth daily.  0  . ibuprofen (ADVIL,MOTRIN) 800 MG tablet Take 1 tablet (800 mg total) by mouth every 8 (eight) hours as needed. 30 tablet 0  . lisinopril (PRINIVIL,ZESTRIL) 40 MG tablet TAKE 1 TABLET(40 MG) BY MOUTH DAILY 90 tablet 1  . metoprolol succinate (TOPROL-XL) 25 MG 24 hr tablet TAKE 1/2 TABLET(12.5 MG) BY MOUTH DAILY 45 tablet 6   No current facility-administered medications for this visit.      Past Medical History:  Diagnosis Date  . COPD (chronic obstructive pulmonary disease) ([place])   . Elevated troponin 06/13/2016  .  Hypertension     Past Surgical History:  Procedure Laterality Date  . BACK SURGERY     fusion in 1973  . CARDIAC CATHETERIZATION N/A 06/13/2016   Procedure: Left Heart Cath and Coronary Angiography;  Surgeon: Nelva Bush, MD;  Location: [place] [facility];  Service: Cardiovascular;  Laterality: N/A;  . INGUINAL HERNIA REPAIR Right 04/21/2018   Procedure: RIGHT INGUINAL HERNIA REPAIR ERAS PATHWAY;  Surgeon: Erroll Luna, MD;  Location: [place];  Service: General;  Laterality: Right;  TAP BLOCK  . INSERTION OF MESH Right 04/21/2018   Procedure: INSERTION OF MESH;  Surgeon: Erroll Luna, MD;  Location: [place];  Service: General;  Laterality: Right;  . TONSILLECTOMY      Social History   Socioeconomic History  . Marital status: Legally Separated    Spouse name: Not on file  . Number of children: Not on file  . Years of education: Not on file  . Highest education level: Not on file  Occupational History  . Not on file  Social Needs  . Financial resource strain: Not on file  . Food insecurity:    Worry: Not on file    Inability: Not on file  . Transportation needs:    Medical: Not on file    Non-medical: Not on file  Tobacco Use  . Smoking status: Current Every Day Smoker    Packs/day: 0.50    Types: Cigarettes  . Smokeless tobacco: Never Used  Substance and Sexual Activity  . Alcohol use: No  Comment: No alcohol for 17 years  . Drug use: No  . Sexual activity: Not on file  Lifestyle  . Physical activity:    Days per week: Not on file    Minutes per session: Not on file  . Stress: Not on file  Relationships  . Social connections:    Talks on phone: Not on file    Gets together: Not on file    Attends religious service: Not on file    Active member of club or organization: Not on file    Attends meetings of clubs or organizations: Not on file    Relationship status: Not on file  . Intimate partner violence:    Fear of current or ex partner: Not on file     Emotionally abused: Not on file    Physically abused: Not on file    Forced sexual activity: Not on file  Other Topics Concern  . Not on file  Social History Narrative  . Not on file    Family History  Problem Relation Age of Onset  . Hypertension Mother   . Hypertension Father     ROS: no fevers or chills, productive cough, hemoptysis, dysphasia, odynophagia, melena, hematochezia, dysuria, hematuria, rash, seizure activity, orthopnea, PND, pedal edema, claudication. Remaining systems are negative.  Physical Exam: Well-developed well-nourished in no acute distress.  Skin is warm and dry.  HEENT is normal.  Neck is supple.  Chest is clear to auscultation with normal expansion.  Cardiovascular exam is regular rate and rhythm.  Abdominal exam nontender or distended. No masses palpated. Extremities show no edema. neuro grossly intact  ECG-sinus rhythm at a rate of 88, left axis deviation.  Personally reviewed  A/P  1 coronary artery disease-patient doing well with no chest pain.  Continue medical therapy with aspirin and statin.  2 hypertension-patient's blood pressure is controlled.  However he is having some orthostatic symptoms.  Decrease lisinopril from 40 to 20 mg daily and follow.  Check potassium and renal function.  3 hyperlipidemia-continue statin.  Check lipids and liver.  4 history of cardiomyopathy-LV function improved on most recent echocardiogram.  Continue ACE inhibitor and beta-blocker.  5 abdominal aortic aneurysm-plan follow-up abdominal ultrasound February 2021.  Kirk Ruths, MD

## 2018-12-14 ENCOUNTER — Ambulatory Visit (INDEPENDENT_AMBULATORY_CARE_PROVIDER_SITE_OTHER): Payer: Medicare Other | Admitting: Cardiology

## 2018-12-14 ENCOUNTER — Encounter: Payer: Self-pay | Admitting: Cardiology

## 2018-12-14 VITALS — BP 128/80 | HR 88 | Ht 69.0 in | Wt 179.5 lb

## 2018-12-14 DIAGNOSIS — E7800 Pure hypercholesterolemia, unspecified: Secondary | ICD-10-CM | POA: Diagnosis not present

## 2018-12-14 DIAGNOSIS — I10 Essential (primary) hypertension: Secondary | ICD-10-CM | POA: Diagnosis not present

## 2018-12-14 DIAGNOSIS — I2510 Atherosclerotic heart disease of native coronary artery without angina pectoris: Secondary | ICD-10-CM | POA: Diagnosis not present

## 2018-12-14 LAB — LIPID PANEL
CHOLESTEROL TOTAL: 125 mg/dL (ref 100–199)
Chol/HDL Ratio: 2.4 ratio (ref 0.0–5.0)
HDL: 53 mg/dL (ref >39)
LDL CALC: 57 mg/dL (ref 0–99)
TRIGLYCERIDES: 77 mg/dL (ref 0–149)
VLDL Cholesterol Cal: 15 mg/dL (ref 5–40)

## 2018-12-14 LAB — COMPREHENSIVE METABOLIC PANEL
A/G RATIO: 1.9 (ref 1.2–2.2)
ALBUMIN: 4.1 g/dL (ref 3.7–4.7)
ALT: 16 IU/L (ref 0–44)
AST: 10 IU/L (ref 0–40)
Alkaline Phosphatase: 82 IU/L (ref 39–117)
BILIRUBIN TOTAL: 0.2 mg/dL (ref 0.0–1.2)
BUN / CREAT RATIO: 15 (ref 10–24)
BUN: 15 mg/dL (ref 8–27)
CHLORIDE: 104 mmol/L (ref 96–106)
CO2: 24 mmol/L (ref 20–29)
Calcium: 8.9 mg/dL (ref 8.6–10.2)
Creatinine, Ser: 1.03 mg/dL (ref 0.76–1.27)
GFR calc non Af Amer: 72 mL/min/{1.73_m2} (ref >59)
GFR, EST AFRICAN AMERICAN: 84 mL/min/{1.73_m2} (ref >59)
GLUCOSE: 140 mg/dL — AB (ref 65–99)
Globulin, Total: 2.2 g/dL (ref 1.5–4.5)
Potassium: 5 mmol/L (ref 3.5–5.2)
Sodium: 142 mmol/L (ref 134–144)
TOTAL PROTEIN: 6.3 g/dL (ref 6.0–8.5)

## 2018-12-14 MED ORDER — LISINOPRIL 20 MG PO TABS: 20.0000 mg | ORAL_TABLET | Freq: Every day | ORAL | 3 refills | Status: DC

## 2018-12-14 NOTE — Patient Instructions (Signed)
Medication Instructions:  DECREASE LISINOPRIL TO 20 MG ONCE DAILY= 1/2 OF THE 40 MG TABLET ONCE DAILY If you need a refill on your cardiac medications before your next appointment, please call your pharmacy.   Lab work: Your physician recommends that you HAVE LAB WORK TODAY If you have labs (blood work) drawn today and your tests are completely normal, you will receive your results only by: Marland Kitchen MyChart Message (if you have MyChart) OR . A paper copy in the mail If you have any lab test that is abnormal or we need to change your treatment, we will call you to review the results.  Follow-Up: At [facility], you and your health needs are our priority.  As part of our continuing mission to provide you with exceptional heart care, we have created designated Provider Care Teams.  These Care Teams include your primary Cardiologist (physician) and Advanced Practice Providers (APPs -  Physician Assistants and Nurse Practitioners) who all work together to provide you with the care you need, when you need it. You will need a follow up appointment in 12 months.  Please call our office 2 months in advance to schedule this appointment.  You may see Kirk Ruths, MD or one of the following Advanced Practice Providers on your designated Care Team:   Kerin Ransom, PA-C Roby Lofts, [place] . Sande Rives, PA-C

## 2018-12-16 ENCOUNTER — Encounter: Payer: Self-pay | Admitting: *Deleted

## 2019-01-01 ENCOUNTER — Other Ambulatory Visit: Payer: Self-pay | Admitting: Cardiology

## 2019-04-26 ENCOUNTER — Other Ambulatory Visit: Payer: Self-pay | Admitting: Cardiology

## 2019-11-23 ENCOUNTER — Other Ambulatory Visit: Payer: Self-pay | Admitting: *Deleted

## 2019-11-23 DIAGNOSIS — I714 Abdominal aortic aneurysm, without rupture, unspecified: Secondary | ICD-10-CM

## 2019-12-07 ENCOUNTER — Ambulatory Visit (HOSPITAL_COMMUNITY)
Admission: RE | Admit: 2019-12-07 | Discharge: 2019-12-07 | Disposition: A | Discharge status: Home / Self Care | Payer: Medicare Other | Source: Ambulatory Visit | Attending: Cardiovascular Disease | Admitting: Cardiovascular Disease

## 2019-12-07 ENCOUNTER — Encounter: Payer: Self-pay | Admitting: *Deleted

## 2019-12-07 ENCOUNTER — Other Ambulatory Visit: Payer: Self-pay

## 2019-12-07 DIAGNOSIS — I714 Abdominal aortic aneurysm, without rupture, unspecified: Secondary | ICD-10-CM

## 2019-12-08 NOTE — Progress Notes (Signed)
HPI: FUcoronary artery disease. Patientpreviously admitted with a non-ST elevation myocardial infarction. Cardiac catheterization 06/13/2016 showed mild diffuse nonobstructive coronary disease. Ejection fraction was 25-30% and felt consistent withtako tsubo CM. Echocardiogram 2 days later showed normal LV function and mild right ventricular enlargement. Abdominal ultrasound February 2021 showed no aneurysm.  Since last seenhe has had worsening dyspnea on exertion that he attributes to his COPD.  There is no orthopnea, PND or pedal edema.  He has chest pain with cough but no other symptoms.  No syncope.  Current Outpatient Medications  Medication Sig Dispense Refill  . albuterol (PROVENTIL HFA;VENTOLIN HFA) 108 (90 Base) MCG/ACT inhaler Inhale 2 puffs into the lungs every 6 (six) hours.     Marland Kitchen aspirin 81 MG chewable tablet CHEW AND SWALLOW 1 TABLET(81 MG) BY MOUTH DAILY 30 tablet 2  . atorvastatin (LIPITOR) 40 MG tablet TAKE 1 TABLET BY MOUTH DAILY AT 6 PM 90 tablet 3  . budesonide-formoterol (SYMBICORT) 160-4.5 MCG/ACT inhaler Inhale 2 puffs into the lungs 2 (two) times daily.    Marland Kitchen buPROPion (WELLBUTRIN XL) 300 MG 24 hr tablet Take 300 mg by mouth daily.  1  . citalopram (CELEXA) 40 MG tablet Take 40 mg by mouth daily.  0  . Fluticasone-Umeclidin-Vilant (TRELEGY ELLIPTA) 100-62.5-25 MCG/INH AEPB Inhale 1 puff into the lungs daily.    Marland Kitchen ibuprofen (ADVIL,MOTRIN) 800 MG tablet Take 1 tablet (800 mg total) by mouth every 8 (eight) hours as needed. 30 tablet 0  . lisinopril (ZESTRIL) 40 MG tablet TAKE 1 TABLET(40 MG) BY MOUTH DAILY 90 tablet 1  . metoprolol succinate (TOPROL-XL) 25 MG 24 hr tablet TAKE 1/2 TABLET(12.5 MG) BY MOUTH DAILY 45 tablet 0   No current facility-administered medications for this visit.     Past Medical History:  Diagnosis Date  . COPD (chronic obstructive pulmonary disease) ([place])   . Elevated troponin 06/13/2016  . Hypertension     Past Surgical History:    Procedure Laterality Date  . BACK SURGERY     fusion in 1973  . CARDIAC CATHETERIZATION N/A 06/13/2016   Procedure: Left Heart Cath and Coronary Angiography;  Surgeon: Nelva Bush, MD;  Location: [place] [facility];  Service: Cardiovascular;  Laterality: N/A;  . INGUINAL HERNIA REPAIR Right 04/21/2018   Procedure: RIGHT INGUINAL HERNIA REPAIR ERAS PATHWAY;  Surgeon: Erroll Luna, MD;  Location: [place];  Service: General;  Laterality: Right;  TAP BLOCK  . INSERTION OF MESH Right 04/21/2018   Procedure: INSERTION OF MESH;  Surgeon: Erroll Luna, MD;  Location: [place];  Service: General;  Laterality: Right;  . TONSILLECTOMY      Social History   Socioeconomic History  . Marital status: Legally Separated    Spouse name: Not on file  . Number of children: Not on file  . Years of education: Not on file  . Highest education level: Not on file  Occupational History  . Not on file  Tobacco Use  . Smoking status: Current Every Day Smoker    Packs/day: 0.50    Types: Cigarettes  . Smokeless tobacco: Never Used  Substance and Sexual Activity  . Alcohol use: No    Comment: No alcohol for 17 years  . Drug use: No  . Sexual activity: Not on file  Other Topics Concern  . Not on file  Social History Narrative  . Not on file   Social Determinants of Health   Financial Resource Strain:   . Difficulty  of Paying Living Expenses: Not on file  Food Insecurity:   . Worried About Charity fundraiser in the Last Year: Not on file  . Ran Out of Food in the Last Year: Not on file  Transportation Needs:   . Lack of Transportation (Medical): Not on file  . Lack of Transportation (Non-Medical): Not on file  Physical Activity:   . Days of Exercise per Week: Not on file  . Minutes of Exercise per Session: Not on file  Stress:   . Feeling of Stress : Not on file  Social Connections:   . Frequency of Communication with Friends and Family: Not on file  . Frequency of Social Gatherings with  Friends and Family: Not on file  . Attends Religious Services: Not on file  . Active Member of Clubs or Organizations: Not on file  . Attends Archivist Meetings: Not on file  . Marital Status: Not on file  Intimate Partner Violence:   . Fear of Current or Ex-Partner: Not on file  . Emotionally Abused: Not on file  . Physically Abused: Not on file  . Sexually Abused: Not on file    Family History  Problem Relation Age of Onset  . Hypertension Mother   . Hypertension Father     ROS: no fevers or chills, productive cough, hemoptysis, dysphasia, odynophagia, melena, hematochezia, dysuria, hematuria, rash, seizure activity, orthopnea, PND, pedal edema, claudication. Remaining systems are negative.  Physical Exam: Well-developed well-nourished in no acute distress.  Skin is warm and dry.  HEENT is normal.  Neck is supple.  Chest expiratory wheeze with diminished breath sounds Cardiovascular exam is regular rate and rhythm.  Abdominal exam nontender or distended. No masses palpated. Extremities show no edema. neuro grossly intact  ECG-sinus tachycardia at a rate of 107, right bundle branch block, left anterior fascicular block.  Personally reviewed  A/P  1 coronary artery disease patient denies exertional chest pain.  Continue aspirin and statin.  2 hypertension-blood pressure controlled.  Continue present medications.  Check potassium and renal function.  3 hyperlipidemia-continue statin.  Check lipids and liver.  4 History of cardiomyopathy-his most recent echocardiogram shows improvement in LV systolic function.  Continue ACE inhibitor and beta-blocker.  5 history of abdominal aortic aneurysm-not evident on most recent ultrasound.  Kirk Ruths, MD

## 2019-12-13 ENCOUNTER — Other Ambulatory Visit: Payer: Self-pay

## 2019-12-13 MED ORDER — METOPROLOL SUCCINATE ER 25 MG PO TB24: ORAL_TABLET | ORAL | 0 refills | Status: DC

## 2019-12-14 ENCOUNTER — Other Ambulatory Visit: Payer: Self-pay

## 2019-12-14 ENCOUNTER — Encounter: Payer: Self-pay | Admitting: Cardiology

## 2019-12-14 ENCOUNTER — Ambulatory Visit (INDEPENDENT_AMBULATORY_CARE_PROVIDER_SITE_OTHER): Payer: Medicare Other | Admitting: Cardiology

## 2019-12-14 VITALS — BP 136/80 | HR 107 | Ht 69.0 in | Wt 195.4 lb

## 2019-12-14 DIAGNOSIS — I10 Essential (primary) hypertension: Secondary | ICD-10-CM | POA: Diagnosis not present

## 2019-12-14 DIAGNOSIS — I2510 Atherosclerotic heart disease of native coronary artery without angina pectoris: Secondary | ICD-10-CM

## 2019-12-14 DIAGNOSIS — I714 Abdominal aortic aneurysm, without rupture, unspecified: Secondary | ICD-10-CM

## 2019-12-14 DIAGNOSIS — E7800 Pure hypercholesterolemia, unspecified: Secondary | ICD-10-CM | POA: Diagnosis not present

## 2019-12-14 NOTE — Patient Instructions (Signed)
Medication Instructions:  NO CHANGE *If you need a refill on your cardiac medications before your next appointment, please call your pharmacy*  Lab Work: Your physician recommends that you HAVE LAB WORK TODAY If you have labs (blood work) drawn today and your tests are completely normal, you will receive your results only by: Marland Kitchen MyChart Message (if you have MyChart) OR . A paper copy in the mail If you have any lab test that is abnormal or we need to change your treatment, we will call you to review the results.  Follow-Up: At [facility], you and your health needs are our priority.  As part of our continuing mission to provide you with exceptional heart care, we have created designated Provider Care Teams.  These Care Teams include your primary Cardiologist (physician) and Advanced Practice Providers (APPs -  Physician Assistants and Nurse Practitioners) who all work together to provide you with the care you need, when you need it.  Your next appointment:   12 month(s)  The format for your next appointment:   Either In Person or Virtual  Provider:   You may see Kirk Ruths, MD or one of the following Advanced Practice Providers on your designated Care Team:    Kerin Ransom, PA-C  [place], [place]  Coletta Memos, [place]

## 2019-12-15 ENCOUNTER — Encounter: Payer: Self-pay | Admitting: *Deleted

## 2019-12-15 LAB — COMPREHENSIVE METABOLIC PANEL
ALT: 17 IU/L (ref 0–44)
AST: 11 IU/L (ref 0–40)
Albumin/Globulin Ratio: 2 (ref 1.2–2.2)
Albumin: 4.5 g/dL (ref 3.7–4.7)
Alkaline Phosphatase: 102 IU/L (ref 39–117)
BUN/Creatinine Ratio: 15 (ref 10–24)
BUN: 21 mg/dL (ref 8–27)
Bilirubin Total: <0.2 mg/dL (ref 0.0–1.2)
CO2: 21 mmol/L (ref 20–29)
Calcium: 9.5 mg/dL (ref 8.6–10.2)
Chloride: 105 mmol/L (ref 96–106)
Creatinine, Ser: 1.41 mg/dL — ABNORMAL HIGH (ref 0.76–1.27)
GFR calc Af Amer: 57 mL/min/{1.73_m2} — ABNORMAL LOW (ref >59)
GFR calc non Af Amer: 49 mL/min/{1.73_m2} — ABNORMAL LOW (ref >59)
Globulin, Total: 2.2 g/dL (ref 1.5–4.5)
Glucose: 126 mg/dL — ABNORMAL HIGH (ref 65–99)
Potassium: 4.4 mmol/L (ref 3.5–5.2)
Sodium: 142 mmol/L (ref 134–144)
Total Protein: 6.7 g/dL (ref 6.0–8.5)

## 2019-12-15 LAB — LIPID PANEL
Chol/HDL Ratio: 2.3 ratio (ref 0.0–5.0)
Cholesterol, Total: 134 mg/dL (ref 100–199)
HDL: 59 mg/dL (ref >39)
LDL Chol Calc (NIH): 57 mg/dL (ref 0–99)
Triglycerides: 99 mg/dL (ref 0–149)
VLDL Cholesterol Cal: 18 mg/dL (ref 5–40)

## 2019-12-31 ENCOUNTER — Ambulatory Visit: Payer: Medicare Other | Attending: Internal Medicine

## 2019-12-31 DIAGNOSIS — Z23 Encounter for immunization: Secondary | ICD-10-CM

## 2019-12-31 NOTE — Progress Notes (Signed)
   Covid-19 Vaccination Clinic  Name:  Demontez Novack    MRN: [id number] DOB: 12/16/1945  12/31/2019  Mr. Ragone was observed post Covid-19 immunization for 15 minutes without incident. He was provided with Vaccine Information Sheet and instruction to access the V-Safe system.   Mr. Lorusso was instructed to call 911 with any severe reactions post vaccine: Marland Kitchen Difficulty breathing  . Swelling of face and throat  . A fast heartbeat  . A bad rash all over body  . Dizziness and weakness   Immunizations Administered    Name Date Dose VIS Date Route   Pfizer COVID-19 Vaccine 12/31/2019 12:41 PM 0.3 mL 10/01/2019 Intramuscular   Manufacturer: [employer]   Lot: [id number]   [place]: [id number]

## 2020-01-17 ENCOUNTER — Other Ambulatory Visit: Payer: Self-pay | Admitting: *Deleted

## 2020-01-17 MED ORDER — ATORVASTATIN CALCIUM 40 MG PO TABS: 40.0000 mg | ORAL_TABLET | Freq: Every day | ORAL | 2 refills | Status: DC

## 2020-01-17 NOTE — Telephone Encounter (Signed)
Rx has been sent to the pharmacy electronically. ° °

## 2020-01-24 ENCOUNTER — Ambulatory Visit: Payer: Medicare Other | Attending: Internal Medicine

## 2020-01-24 DIAGNOSIS — Z23 Encounter for immunization: Secondary | ICD-10-CM

## 2020-01-24 NOTE — Progress Notes (Signed)
   Covid-19 Vaccination Clinic  Name:  Gary Wright    MRN: [id number] DOB: 1945-12-15  01/24/2020  Gary Wright was observed post Covid-19 immunization for 15 minutes without incident. He was provided with Vaccine Information Sheet and instruction to access the V-Safe system.   Gary Wright was instructed to call 911 with any severe reactions post vaccine: Marland Kitchen Difficulty breathing  . Swelling of face and throat  . A fast heartbeat  . A bad rash all over body  . Dizziness and weakness   Immunizations Administered    Name Date Dose VIS Date Route   Pfizer COVID-19 Vaccine 01/24/2020  3:20 PM 0.3 mL 10/01/2019 Intramuscular   Manufacturer: [employer], [employer]   Lot: [id number]   [place]: [id number]

## 2020-03-02 ENCOUNTER — Other Ambulatory Visit: Payer: Self-pay

## 2020-03-02 MED ORDER — METOPROLOL SUCCINATE ER 25 MG PO TB24: ORAL_TABLET | ORAL | 1 refills | Status: DC

## 2020-04-19 ENCOUNTER — Other Ambulatory Visit: Payer: Self-pay

## 2020-04-19 MED ORDER — LISINOPRIL 40 MG PO TABS: ORAL_TABLET | ORAL | 3 refills | Status: DC

## 2020-08-14 ENCOUNTER — Other Ambulatory Visit: Payer: Self-pay | Admitting: Cardiology

## 2020-11-02 ENCOUNTER — Emergency Department (HOSPITAL_COMMUNITY): Payer: Medicare Other

## 2020-11-02 ENCOUNTER — Other Ambulatory Visit: Payer: Self-pay

## 2020-11-02 ENCOUNTER — Inpatient Hospital Stay (HOSPITAL_COMMUNITY)
Admission: EM | Admit: 2020-11-02 | Discharge: 2020-11-04 | DRG: 190 | Disposition: A | Discharge status: Home / Self Care | Payer: Medicare Other | Attending: Internal Medicine | Admitting: Internal Medicine

## 2020-11-02 ENCOUNTER — Encounter (HOSPITAL_COMMUNITY): Payer: Self-pay

## 2020-11-02 DIAGNOSIS — J441 Chronic obstructive pulmonary disease with (acute) exacerbation: Secondary | ICD-10-CM | POA: Diagnosis present

## 2020-11-02 DIAGNOSIS — D72829 Elevated white blood cell count, unspecified: Secondary | ICD-10-CM | POA: Diagnosis present

## 2020-11-02 DIAGNOSIS — F17210 Nicotine dependence, cigarettes, uncomplicated: Secondary | ICD-10-CM | POA: Diagnosis present

## 2020-11-02 DIAGNOSIS — R778 Other specified abnormalities of plasma proteins: Secondary | ICD-10-CM | POA: Diagnosis present

## 2020-11-02 DIAGNOSIS — Z981 Arthrodesis status: Secondary | ICD-10-CM | POA: Diagnosis not present

## 2020-11-02 DIAGNOSIS — Z79899 Other long term (current) drug therapy: Secondary | ICD-10-CM

## 2020-11-02 DIAGNOSIS — I10 Essential (primary) hypertension: Secondary | ICD-10-CM | POA: Diagnosis present

## 2020-11-02 DIAGNOSIS — Z85118 Personal history of other malignant neoplasm of bronchus and lung: Secondary | ICD-10-CM

## 2020-11-02 DIAGNOSIS — Z7982 Long term (current) use of aspirin: Secondary | ICD-10-CM

## 2020-11-02 DIAGNOSIS — I25810 Atherosclerosis of coronary artery bypass graft(s) without angina pectoris: Secondary | ICD-10-CM | POA: Diagnosis present

## 2020-11-02 DIAGNOSIS — Z8249 Family history of ischemic heart disease and other diseases of the circulatory system: Secondary | ICD-10-CM | POA: Diagnosis not present

## 2020-11-02 DIAGNOSIS — R9431 Abnormal electrocardiogram [ECG] [EKG]: Secondary | ICD-10-CM | POA: Diagnosis not present

## 2020-11-02 DIAGNOSIS — E785 Hyperlipidemia, unspecified: Secondary | ICD-10-CM

## 2020-11-02 DIAGNOSIS — J439 Emphysema, unspecified: Secondary | ICD-10-CM | POA: Diagnosis present

## 2020-11-02 DIAGNOSIS — R739 Hyperglycemia, unspecified: Secondary | ICD-10-CM | POA: Diagnosis present

## 2020-11-02 DIAGNOSIS — F419 Anxiety disorder, unspecified: Secondary | ICD-10-CM | POA: Diagnosis present

## 2020-11-02 DIAGNOSIS — J9601 Acute respiratory failure with hypoxia: Secondary | ICD-10-CM | POA: Diagnosis present

## 2020-11-02 DIAGNOSIS — Z20822 Contact with and (suspected) exposure to covid-19: Secondary | ICD-10-CM | POA: Diagnosis present

## 2020-11-02 DIAGNOSIS — Z7951 Long term (current) use of inhaled steroids: Secondary | ICD-10-CM

## 2020-11-02 DIAGNOSIS — R7989 Other specified abnormal findings of blood chemistry: Secondary | ICD-10-CM | POA: Diagnosis present

## 2020-11-02 DIAGNOSIS — J9621 Acute and chronic respiratory failure with hypoxia: Secondary | ICD-10-CM

## 2020-11-02 DIAGNOSIS — F32A Depression, unspecified: Secondary | ICD-10-CM | POA: Diagnosis present

## 2020-11-02 DIAGNOSIS — T380X5A Adverse effect of glucocorticoids and synthetic analogues, initial encounter: Secondary | ICD-10-CM | POA: Diagnosis present

## 2020-11-02 DIAGNOSIS — Z87891 Personal history of nicotine dependence: Secondary | ICD-10-CM

## 2020-11-02 HISTORY — DX: Abnormal electrocardiogram (ECG) (EKG): R94.31

## 2020-11-02 HISTORY — DX: Personal history of other malignant neoplasm of bronchus and lung: Z85.118

## 2020-11-02 HISTORY — DX: Hyperlipidemia, unspecified: E78.5

## 2020-11-02 HISTORY — DX: Chronic obstructive pulmonary disease with (acute) exacerbation: J44.1

## 2020-11-02 LAB — CBC
HCT: 45.2 % (ref 39.0–52.0)
Hemoglobin: 14.5 g/dL (ref 13.0–17.0)
MCH: 31.5 pg (ref 26.0–34.0)
MCHC: 32.1 g/dL (ref 30.0–36.0)
MCV: 98 fL (ref 80.0–100.0)
Platelets: 252 10*3/uL (ref 150–400)
RBC: 4.61 MIL/uL (ref 4.22–5.81)
RDW: 13 % (ref 11.5–15.5)
WBC: 14 10*3/uL — ABNORMAL HIGH (ref 4.0–10.5)
nRBC: 0 % (ref 0.0–0.2)

## 2020-11-02 LAB — BASIC METABOLIC PANEL
Anion gap: 12 (ref 5–15)
BUN: 15 mg/dL (ref 8–23)
CO2: 23 mmol/L (ref 22–32)
Calcium: 9 mg/dL (ref 8.9–10.3)
Chloride: 103 mmol/L (ref 98–111)
Creatinine, Ser: 1.13 mg/dL (ref 0.61–1.24)
GFR, Estimated: >60 mL/min (ref >60)
Glucose, Bld: 156 mg/dL — ABNORMAL HIGH (ref 70–99)
Potassium: 4.2 mmol/L (ref 3.5–5.1)
Sodium: 138 mmol/L (ref 135–145)

## 2020-11-02 LAB — RESP PANEL BY RT-PCR (FLU A&B, COVID) ARPGX2
Influenza A by PCR: NEGATIVE
Influenza B by PCR: NEGATIVE
SARS Coronavirus 2 by RT PCR: NEGATIVE

## 2020-11-02 LAB — TROPONIN I (HIGH SENSITIVITY)
Troponin I (High Sensitivity): 34 ng/L — ABNORMAL HIGH (ref <18)
Troponin I (High Sensitivity): 44 ng/L — ABNORMAL HIGH (ref <18)

## 2020-11-02 MED ORDER — METHYLPREDNISOLONE SODIUM SUCC 125 MG IJ SOLR
60.0000 mg | Freq: Four times a day (QID) | INTRAMUSCULAR | Status: AC
  Administered 2020-11-02: 60 mg via INTRAVENOUS
  Filled 2020-11-02: qty 2

## 2020-11-02 MED ORDER — PREDNISONE 20 MG PO TABS
40.0000 mg | ORAL_TABLET | Freq: Every day | ORAL | Status: DC
  Administered 2020-11-03 – 2020-11-04 (×2): 40 mg via ORAL
  Filled 2020-11-02 (×2): qty 2

## 2020-11-02 MED ORDER — ATORVASTATIN CALCIUM 40 MG PO TABS
40.0000 mg | ORAL_TABLET | Freq: Every day | ORAL | Status: DC
  Administered 2020-11-03 – 2020-11-04 (×2): 40 mg via ORAL
  Filled 2020-11-02 (×2): qty 1

## 2020-11-02 MED ORDER — ALBUTEROL SULFATE (2.5 MG/3ML) 0.083% IN NEBU
5.0000 mg | INHALATION_SOLUTION | Freq: Once | RESPIRATORY_TRACT | Status: AC
  Administered 2020-11-02: 5 mg via RESPIRATORY_TRACT
  Filled 2020-11-02: qty 6

## 2020-11-02 MED ORDER — ONDANSETRON HCL 4 MG PO TABS: 4.0000 mg | ORAL_TABLET | Freq: Four times a day (QID) | ORAL | Status: DC | PRN

## 2020-11-02 MED ORDER — SODIUM CHLORIDE 0.9% FLUSH
3.0000 mL | Freq: Two times a day (BID) | INTRAVENOUS | Status: DC
  Administered 2020-11-03 – 2020-11-04 (×3): 3 mL via INTRAVENOUS

## 2020-11-02 MED ORDER — KETOROLAC TROMETHAMINE 15 MG/ML IJ SOLN
15.0000 mg | Freq: Once | INTRAMUSCULAR | Status: AC
  Administered 2020-11-02: 15 mg via INTRAVENOUS
  Filled 2020-11-02: qty 1

## 2020-11-02 MED ORDER — HYDROCOD POLST-CPM POLST ER 10-8 MG/5ML PO SUER
5.0000 mL | Freq: Once | ORAL | Status: AC
Start: 2020-11-02 — End: 2020-11-02
  Administered 2020-11-02: 5 mL via ORAL
  Filled 2020-11-02: qty 5

## 2020-11-02 MED ORDER — HYDROCOD POLST-CPM POLST ER 10-8 MG/5ML PO SUER
5.0000 mL | Freq: Two times a day (BID) | ORAL | Status: DC | PRN
  Administered 2020-11-02 – 2020-11-03 (×3): 5 mL via ORAL
  Filled 2020-11-02 (×3): qty 5

## 2020-11-02 MED ORDER — ONDANSETRON HCL 4 MG/2ML IJ SOLN: 4.0000 mg | Freq: Four times a day (QID) | INTRAMUSCULAR | Status: DC | PRN

## 2020-11-02 MED ORDER — METOPROLOL SUCCINATE ER 25 MG PO TB24
12.5000 mg | ORAL_TABLET | Freq: Every day | ORAL | Status: DC
  Administered 2020-11-03 – 2020-11-04 (×2): 12.5 mg via ORAL
  Filled 2020-11-02 (×2): qty 1

## 2020-11-02 MED ORDER — IPRATROPIUM-ALBUTEROL 0.5-2.5 (3) MG/3ML IN SOLN
3.0000 mL | Freq: Once | RESPIRATORY_TRACT | Status: AC
  Administered 2020-11-02: 3 mL via RESPIRATORY_TRACT
  Filled 2020-11-02: qty 3

## 2020-11-02 MED ORDER — ALBUTEROL SULFATE HFA 108 (90 BASE) MCG/ACT IN AERS
8.0000 | INHALATION_SPRAY | Freq: Once | RESPIRATORY_TRACT | Status: AC
  Administered 2020-11-02: 8 via RESPIRATORY_TRACT
  Filled 2020-11-02: qty 6.7

## 2020-11-02 MED ORDER — IPRATROPIUM BROMIDE 0.02 % IN SOLN
0.5000 mg | Freq: Once | RESPIRATORY_TRACT | Status: AC
  Administered 2020-11-02: 0.5 mg via RESPIRATORY_TRACT
  Filled 2020-11-02: qty 2.5

## 2020-11-02 MED ORDER — ALBUTEROL SULFATE (2.5 MG/3ML) 0.083% IN NEBU: 2.5000 mg | INHALATION_SOLUTION | RESPIRATORY_TRACT | Status: DC | PRN

## 2020-11-02 MED ORDER — GUAIFENESIN ER 600 MG PO TB12
600.0000 mg | ORAL_TABLET | Freq: Two times a day (BID) | ORAL | Status: DC
  Administered 2020-11-02 – 2020-11-04 (×5): 600 mg via ORAL
  Filled 2020-11-02 (×5): qty 1

## 2020-11-02 MED ORDER — SODIUM CHLORIDE 0.9 % IV SOLN
1.0000 g | INTRAVENOUS | Status: DC
  Administered 2020-11-03 – 2020-11-04 (×2): 1 g via INTRAVENOUS
  Filled 2020-11-02 (×3): qty 10

## 2020-11-02 MED ORDER — ENOXAPARIN SODIUM 40 MG/0.4ML ~~LOC~~ SOLN
40.0000 mg | SUBCUTANEOUS | Status: DC
  Administered 2020-11-03 – 2020-11-04 (×2): 40 mg via SUBCUTANEOUS
  Filled 2020-11-02 (×2): qty 0.4

## 2020-11-02 MED ORDER — MAGNESIUM SULFATE 2 GM/50ML IV SOLN
2.0000 g | Freq: Once | INTRAVENOUS | Status: AC
  Administered 2020-11-02: 2 g via INTRAVENOUS
  Filled 2020-11-02: qty 50

## 2020-11-02 MED ORDER — IPRATROPIUM-ALBUTEROL 0.5-2.5 (3) MG/3ML IN SOLN
3.0000 mL | Freq: Four times a day (QID) | RESPIRATORY_TRACT | Status: DC
  Administered 2020-11-02 – 2020-11-04 (×7): 3 mL via RESPIRATORY_TRACT
  Filled 2020-11-02 (×7): qty 3

## 2020-11-02 MED ORDER — ASPIRIN 81 MG PO CHEW
81.0000 mg | CHEWABLE_TABLET | Freq: Every day | ORAL | Status: DC
Start: 2020-11-02 — End: 2020-11-04
  Administered 2020-11-03 – 2020-11-04 (×2): 81 mg via ORAL
  Filled 2020-11-02 (×2): qty 1

## 2020-11-02 MED ORDER — LISINOPRIL 40 MG PO TABS
40.0000 mg | ORAL_TABLET | Freq: Every evening | ORAL | Status: DC
  Administered 2020-11-02 – 2020-11-03 (×2): 40 mg via ORAL
  Filled 2020-11-02 (×2): qty 1

## 2020-11-02 NOTE — Progress Notes (Signed)
TRH night shift telemetry coverage note.  The staff reports that the patient still having pleuritic chest pain despite taking Tussionex 10-8 mg syrup about an hour earlier.  He takes aspirin and ibuprofen at home without significant side effects.  Toradol 15 mg IVP x1 dose ordered.  Tennis Must, MD

## 2020-11-02 NOTE — ED Triage Notes (Signed)
Pt comes via [facility] for SOB, has been going on for 3 days, PTA received 10mg  albuterol, 0.5 Atrovent and 125 solumedrol. Vaccinated , hx of COPD and lung CA with lobectomy

## 2020-11-02 NOTE — Plan of Care (Signed)

## 2020-11-02 NOTE — ED Provider Notes (Signed)
Signout note  75 year old gentleman presenting to ER with concern for shortness of breath.  Clinical presentation most consistent with COPD exacerbation.  Given nebs and steroids by EMS.  At time of signout, plan to follow-up on patient after receiving additional neb.  If patient improving, likely discharge home.  If patient still having significant wheeze or increased work of breathing, consider admission.  7:30 AM received sign out - f/u after neb  9:59 AM Reassessed patient, reports his breathing has significantly improved after receiving the nebulized treatment, still has fair amount of expiratory wheeze but speaks in full sentences and has no respiratory distress.  Currently on 2L, will trial on RA. Will repeat neb, check ambulation trial and reassess.  10:33 AM pt hypoxic to 85% at rest on RA, will admit to medicine for further management of COPD exacerbation   Lucrezia Starch, MD 11/02/20 [phone]

## 2020-11-02 NOTE — ED Provider Notes (Signed)
[place] EMERGENCY DEPARTMENT Provider Note   CSN: [id number] Arrival date & time: 11/02/20  0423   History Chief Complaint  Patient presents with  . Shortness of Breath    Gary Wright is a 75 y.o. male.  The history is provided by the patient.  Shortness of Breath He has history of hypertension, COPD and comes in with shortness of breath for the last 3 days.  He states that there are times where he feels he absolutely cannot breathe.  These will last for several minutes before getting back to his baseline.  He feels like he would not survive the next 1.  He has had a cough productive of brownish sputum.  He denies fever or chills, but has had sweats.  He denies any chest pain but has had some tight feeling in his chest.  There has been no nausea, vomiting, diarrhea.  He has been fully vaccinated against COVID-19 including booster and has had no known sick contacts.  At home, he has been using his albuterol inhaler multiple puffs, multiple times with little relief.  In the ambulance, he was given methylprednisolone, albuterol, ipratropium with moderate improvement.  He has never been hospitalized for his COPD.  Of note, he is not on home oxygen.  Past Medical History:  Diagnosis Date  . COPD (chronic obstructive pulmonary disease) ([place])   . Elevated troponin 06/13/2016  . Hypertension     Patient Active Problem List   Diagnosis Date Noted  . Elevated troponin 06/13/2016  . Stress-induced cardiomyopathy 06/13/2016  . CAP (community acquired pneumonia) 06/13/2016  . Opioid dependence ([place]) 06/13/2016  . CAD (coronary artery disease) of artery bypass graft 06/13/2016  . Chest pain   . TOBACCO ABUSE 09/01/2007  . NARCOTIC ABUSE 09/01/2007  . DEPRESSION 09/01/2007  . HYPERTENSION 09/01/2007  . TONSILLECTOMY, HX OF 09/01/2007    Past Surgical History:  Procedure Laterality Date  . BACK SURGERY     fusion in 1973  . CARDIAC CATHETERIZATION N/A 06/13/2016    Procedure: Left Heart Cath and Coronary Angiography;  Surgeon: Nelva Bush, MD;  Location: [place] [facility];  Service: Cardiovascular;  Laterality: N/A;  . INGUINAL HERNIA REPAIR Right 04/21/2018   Procedure: RIGHT INGUINAL HERNIA REPAIR ERAS PATHWAY;  Surgeon: Erroll Luna, MD;  Location: [facility];  Service: General;  Laterality: Right;  TAP BLOCK  . INSERTION OF MESH Right 04/21/2018   Procedure: INSERTION OF MESH;  Surgeon: Erroll Luna, MD;  Location: [place];  Service: General;  Laterality: Right;  . TONSILLECTOMY         Family History  Problem Relation Age of Onset  . Hypertension Mother   . Hypertension Father     Social History   Tobacco Use  . Smoking status: Current Every Day Smoker    Packs/day: 0.50    Types: Cigarettes  . Smokeless tobacco: Never Used  Vaping Use  . Vaping Use: Never used  Substance Use Topics  . Alcohol use: No    Comment: No alcohol for 17 years  . Drug use: No    Home Medications Prior to Admission medications   Medication Sig Start Date End Date Taking? Authorizing Provider  albuterol (PROVENTIL HFA;VENTOLIN HFA) 108 (90 Base) MCG/ACT inhaler Inhale 2 puffs into the lungs every 6 (six) hours.     [provider]  aspirin 81 MG chewable tablet CHEW AND SWALLOW 1 TABLET(81 MG) BY MOUTH DAILY 07/22/16   Lelon Perla, MD  atorvastatin (  LIPITOR) 40 MG tablet TAKE 1 TABLET(40 MG) BY MOUTH DAILY AT 6 PM 08/14/20   Lelon Perla, MD  budesonide-formoterol [facility]) 160-4.5 MCG/ACT inhaler Inhale 2 puffs into the lungs 2 (two) times daily.    [provider]  buPROPion (WELLBUTRIN XL) 300 MG 24 hr tablet Take 300 mg by mouth daily. 05/11/16   [provider]  citalopram (CELEXA) 40 MG tablet Take 40 mg by mouth daily. 06/01/16   [provider]  Fluticasone-Umeclidin-Vilant (TRELEGY ELLIPTA) 100-62.5-25 MCG/INH AEPB Inhale 1 puff into the lungs daily.    [provider]  ibuprofen (ADVIL,MOTRIN)  800 MG tablet Take 1 tablet (800 mg total) by mouth every 8 (eight) hours as needed. 04/21/18   Cornett, Marcello Moores, MD  lisinopril (ZESTRIL) 40 MG tablet TAKE 1 TABLET(40 MG) BY MOUTH DAILY 04/19/20   Lelon Perla, MD  metoprolol succinate (TOPROL-XL) 25 MG 24 hr tablet TAKE 1/2 TABLET(12.5 MG) BY MOUTH DAILY 08/14/20   Lelon Perla, MD    Allergies    Patient has no known allergies.  Review of Systems   Review of Systems  Respiratory: Positive for shortness of breath.   All other systems reviewed and are negative.   Physical Exam Updated Vital Signs BP 110/72 (BP Location: Right Arm)   Pulse (!) 105   Temp 98.8 F (37.1 C) (Oral)   Resp 20   SpO2 95%   Physical Exam Vitals and nursing note reviewed.   75 year old male, in mild respiratory distress. Vital signs are significant for elevated heart rate. Oxygen saturation is 95%, which is normal. Head is normocephalic and atraumatic. PERRLA, EOMI. Oropharynx is clear. Neck is nontender and supple without adenopathy or JVD. Back is nontender and there is no CVA tenderness. Lungs have few bibasilar rales, moderate inspiratory and expiratory wheezes without rhonchi.  He is usually able to complete a sentence without having to stop for breath, but occasionally has to take a breath before completing the sentence.  There is very slight use of accessory muscles of respiration. Chest is nontender. Heart has regular rate and rhythm without murmur. Abdomen is soft, flat, nontender without masses or hepatosplenomegaly and peristalsis is normoactive. Extremities have no cyanosis or edema, full range of motion is present. Skin is warm and dry without rash. Neurologic: Mental status is normal, cranial nerves are intact, there are no motor or sensory deficits.  ED Results / Procedures / Treatments   Labs (all labs ordered are listed, but only abnormal results are displayed) Labs Reviewed  BASIC METABOLIC PANEL - Abnormal; Notable for the  following components:      Result Value   Glucose, Bld 156 (*)    All other components within normal limits  CBC - Abnormal; Notable for the following components:   WBC 14.0 (*)    All other components within normal limits  TROPONIN I (HIGH SENSITIVITY) - Abnormal; Notable for the following components:   Troponin I (High Sensitivity) 34 (*)    All other components within normal limits  RESP PANEL BY RT-PCR (FLU A&B, COVID) [id number]    EKG EKG Interpretation  Date/Time:  Thursday November 02 2020 04:30:56 EST Ventricular Rate:  110 PR Interval:  154 QRS Duration: 128 QT Interval:  382 QTC Calculation: 516 R Axis:   -64 Text Interpretation: Sinus tachycardia Left axis deviation Right bundle branch block Abnormal ECG When compared with ECG of 04/14/2018, HEART RATE has increased Right bundle branch block is now present Confirmed  by Delora Fuel ([id number]) on 11/02/2020 5:01:33 AM   Radiology DG Chest 2 View  Result Date: 11/02/2020 CLINICAL DATA:  75 year old male with shortness of breath for 3 days. History of lung cancer, prior lobectomy. EXAM: CHEST - 2 VIEW COMPARISON:  Restaging chest CT 05/24/2019 and earlier. FINDINGS: Chronic volume loss in the left lung from lobectomy but otherwise underlying chronic pulmonary hyperinflation. Stable lung volumes since 2020. Mediastinal contours remain within normal limits. Visualized tracheal air column is within normal limits. No pneumothorax. No pulmonary edema. No pleural effusion suspected. EKG button artifact in both upper lungs. No acute pulmonary opacity. Stable visualized osseous structures. Negative visible bowel gas pattern. IMPRESSION: Stable chronic lung disease and postoperative changes to the left lung. No acute cardiopulmonary abnormality. Electronically Signed   By: Genevie Ann M.D.   On: 11/02/2020 05:04   Procedures Procedures   Medications Ordered in ED Medications  magnesium sulfate IVPB 2 g 50 mL (has no administration in time  range)  albuterol (VENTOLIN HFA) 108 (90 Base) MCG/ACT inhaler 8 puff (has no administration in time range)    ED Course  I have reviewed the triage vital signs and the nursing notes.  Pertinent labs & imaging results that were available during my care of the patient were reviewed by me and considered in my medical decision making (see chart for details).  MDM Rules/Calculators/A&P COPD exacerbation.  Old records were reviewed, and he has no prior ED visits or hospitalizations for COPD.  Specimen has been sent for respiratory pathogens and he will be given albuterol via inhaler, will also be given intravenous magnesium.  Chest x-ray shows no acute process, no evidence of pneumonia.  Labs show mild hyperglycemia and mild leukocytosis.  6:43 AM Respiratory pathogen panel has come back negative for influenza and COVID.  Albuterol and ipratropium are ordered via nebulizer.  7:31 AM Slightly more comfortable with slight decrease in wheezing.  Albuterol/ipratropium nebulizer is still in process.  Case is signed out to Dr. Vennie Homans was evaluated in Emergency Department on 11/02/2020 for the symptoms described in the history of present illness. He was evaluated in the context of the global COVID-19 pandemic, which necessitated consideration that the patient might be at risk for infection with the SARS-CoV-2 virus that causes COVID-19. Institutional protocols and algorithms that pertain to the evaluation of patients at risk for COVID-19 are in a state of rapid change based on information released by regulatory bodies including the CDC and federal and state organizations. These policies and algorithms were followed during the patient's care in the ED.   Final Clinical Impression(s) / ED Diagnoses Final diagnoses:  None    Rx / DC Orders ED Discharge Orders    None       Delora Fuel, MD 28/36/62 [phone]

## 2020-11-02 NOTE — H&P (Addendum)
History and Physical    Gary Wright TMH:[id number] DOB: 1946/08/15 DOA: 11/02/2020  Referring MD/NP/PA: Madalyn Rob, MD PCP: Gaye Alken, PA-C   Consultants: Dr. Patrick Jupiter Bauford(pulmonology) Dr. Virgina Organ (cardiothoracic surgery) Patient coming from: home  Chief Complaint: Couldn't breathe  I have personally briefly reviewed patient's old medical records in [place]   HPI: Gary Wright is a 75 y.o. male with medical history significant of hypertension, COPD, lung cancer s/p LUL lobectomy on 10/30/2018, and remote tobacco use who presents with complaints of progressively worsening shortness of breath over the last 3 days.  He does not require oxygen at baseline.  Patient reports that he chronically has a cough, but it was more frequent and reported increased sputum production.  Sputum was noted to be light brown in color.  Any kind of exertion made him feel as though he was not able to get any air in or out due to the tightness in his chest.  Normally he would only use his albuterol inhaler once or twice a day, but was using it several times within an hour without any relief.  Associated symptoms include chest discomfort secondary to coughing.  He reports that he quit smoking a few years ago, but notes that his wife still smokes in the house.  Denies having any significant fever, nausea, vomiting, abdominal pain, diarrhea, myalgias, or loss of consciousness.  He was having such a difficult time breathing this morning they felt like he could pass out or that he may die.    In route with EMS patient was noted to be hypoxic and had been given Solu-Medrol 125 mg IV and albuterol breathing treatments.  ED Course: Upon admission into the emergency department patient was afebrile, pulse 10 4-114, and O2 saturation noted to be as low as 85% on room air with improvement on 2 L nasal cannula oxygen.  Labs significant for WBC 14 and high-sensitivity troponin 34->44.  Influenza and  COVID-19 screening were both negative.  Chest x-ray showed stable chronic chronic lung disease with postoperative changes of the left lung.  Patient had received multiple breathing treatments, and 2 g of magnesium sulfate while in the emergency department.  Review of Systems  Constitutional: Positive for malaise/fatigue. Negative for fever.  HENT: Positive for congestion. Negative for ear discharge.   Eyes: Negative for photophobia and pain.  Respiratory: Positive for cough, sputum production and shortness of breath.   Cardiovascular: Positive for chest pain. Negative for leg swelling.  Gastrointestinal: Negative for nausea and vomiting.  Genitourinary: Negative for dysuria and flank pain.  Musculoskeletal: Negative for falls and joint pain.  Skin: Negative for itching.  Neurological: Negative for focal weakness and loss of consciousness.  Endo/Heme/Allergies: Negative for polydipsia.  Psychiatric/Behavioral: Negative for substance abuse. The patient has insomnia.     Past Medical History:  Diagnosis Date  . COPD (chronic obstructive pulmonary disease) ([place])   . Elevated troponin 06/13/2016  . Hypertension     Past Surgical History:  Procedure Laterality Date  . BACK SURGERY     fusion in 1973  . CARDIAC CATHETERIZATION N/A 06/13/2016   Procedure: Left Heart Cath and Coronary Angiography;  Surgeon: Nelva Bush, MD;  Location: [facility];  Service: Cardiovascular;  Laterality: N/A;  . INGUINAL HERNIA REPAIR Right 04/21/2018   Procedure: RIGHT INGUINAL HERNIA REPAIR ERAS PATHWAY;  Surgeon: Erroll Luna, MD;  Location: [facility];  Service: General;  Laterality: Right;  TAP BLOCK  . INSERTION OF MESH Right 04/21/2018  Procedure: INSERTION OF MESH;  Surgeon: Erroll Luna, MD;  Location: [facility];  Service: General;  Laterality: Right;  . TONSILLECTOMY       reports that he has been smoking cigarettes. He has been smoking about 0.50 packs per day. He has never used smokeless  tobacco. He reports that he does not drink alcohol and does not use drugs.  No Known Allergies  Family History  Problem Relation Age of Onset  . Hypertension Mother   . Hypertension Father     Prior to Admission medications   Medication Sig Start Date End Date Taking? Authorizing Provider  albuterol (PROVENTIL HFA;VENTOLIN HFA) 108 (90 Base) MCG/ACT inhaler Inhale 2 puffs into the lungs every 6 (six) hours.   Yes [provider]  aspirin 81 MG chewable tablet CHEW AND SWALLOW 1 TABLET(81 MG) BY MOUTH DAILY Patient taking differently: Chew 81 mg by mouth daily. 07/22/16  Yes Lelon Perla, MD  atorvastatin (LIPITOR) 40 MG tablet TAKE 1 TABLET(40 MG) BY MOUTH DAILY AT 6 PM Patient taking differently: Take 40 mg by mouth daily. 08/14/20  Yes Lelon Perla, MD  buPROPion (WELLBUTRIN XL) 300 MG 24 hr tablet Take 300 mg by mouth daily. 05/11/16  Yes [provider]  citalopram (CELEXA) 40 MG tablet Take 40 mg by mouth daily. 06/01/16  Yes [provider]  ibuprofen (ADVIL,MOTRIN) 800 MG tablet Take 1 tablet (800 mg total) by mouth every 8 (eight) hours as needed. Patient taking differently: Take 800 mg by mouth every 8 (eight) hours as needed for mild pain. 04/21/18  Yes Cornett, Marcello Moores, MD  lisinopril (ZESTRIL) 40 MG tablet TAKE 1 TABLET(40 MG) BY MOUTH DAILY Patient taking differently: Take 40 mg by mouth every evening. 04/19/20  Yes Lelon Perla, MD  metoprolol succinate (TOPROL-XL) 25 MG 24 hr tablet TAKE 1/2 TABLET(12.5 MG) BY MOUTH DAILY Patient taking differently: Take 12.5 mg by mouth daily at 12 noon. 08/14/20  Yes Lelon Perla, MD  Fluticasone-Umeclidin-Vilant (TRELEGY ELLIPTA) 100-62.5-25 MCG/INH AEPB Inhale 1 puff into the lungs daily.    [provider]    Physical Exam:  Constitutional: Elderly male who appears to be in acute respiratory distress Vitals:   11/02/20 0730 11/02/20 0800 11/02/20 0845 11/02/20 1005  BP: 124/62  123/71 109/63 (!) 114/56  Pulse: 98 (!) 102 (!) 103 (!) 101  Resp: 15 16 13 15   Temp:      TempSrc:      SpO2: 98% 98% 99% 90%   Eyes: PERRL, lids and conjunctivae normal ENMT: Mucous membranes are moist. Posterior pharynx clear of any exudate or lesions.  Neck: normal, supple, no masses, no thyromegaly Respiratory: Expiratory wheezes heard throughout both lung fields with decreased aeration noted in the left upper lobe.  Patient currently on liters of nasal cannula oxygen with O2 saturations maintained. Only able to talk and shortening 2 and 3 word sentence Cardiovascular: Tachycardic, no murmurs / rubs / gallops. No extremity edema. 2+ pedal pulses. No carotid bruits.  Abdomen: no tenderness, no masses palpated. No hepatosplenomegaly. Bowel sounds positive.  Musculoskeletal: no clubbing / cyanosis. No joint deformity upper and lower extremities. Good ROM, no contractures. Normal muscle tone.  Skin: no rashes, lesions, ulcers. No induration Neurologic: CN 2-12 grossly intact. Sensation intact, DTR normal. Strength 5/5 in all 4.  Psychiatric: Normal judgment and insight. Alert and oriented x 3. Normal mood.     Labs on Admission: I have personally reviewed following labs and imaging studies  CBC: Recent Labs  Lab 11/02/20 0446  WBC 14.0*  HGB 14.5  HCT 45.2  MCV 98.0  PLT 433   Basic Metabolic Panel: Recent Labs  Lab 11/02/20 0446  NA 138  K 4.2  CL 103  CO2 23  GLUCOSE 156*  BUN 15  CREATININE 1.13  CALCIUM 9.0   GFR: CrCl cannot be calculated (Unknown ideal weight.). Liver Function Tests: No results for input(s): AST, ALT, ALKPHOS, BILITOT, PROT, ALBUMIN in the last 168 hours. No results for input(s): LIPASE, AMYLASE in the last 168 hours. No results for input(s): AMMONIA in the last 168 hours. Coagulation Profile: No results for input(s): INR, PROTIME in the last 168 hours. Cardiac Enzymes: No results for input(s): CKTOTAL, CKMB, CKMBINDEX, TROPONINI in the  last 168 hours. BNP (last 3 results) No results for input(s): PROBNP in the last 8760 hours. HbA1C: No results for input(s): HGBA1C in the last 72 hours. CBG: No results for input(s): GLUCAP in the last 168 hours. Lipid Profile: No results for input(s): CHOL, HDL, LDLCALC, TRIG, CHOLHDL, LDLDIRECT in the last 72 hours. Thyroid Function Tests: No results for input(s): TSH, T4TOTAL, FREET4, T3FREE, THYROIDAB in the last 72 hours. Anemia Panel: No results for input(s): VITAMINB12, FOLATE, FERRITIN, TIBC, IRON, RETICCTPCT in the last 72 hours. Urine analysis: No results found for: COLORURINE, APPEARANCEUR, LABSPEC, PHURINE, GLUCOSEU, HGBUR, BILIRUBINUR, KETONESUR, PROTEINUR, UROBILINOGEN, NITRITE, LEUKOCYTESUR Sepsis Labs: Recent Results (from the past 240 hour(s))  Resp Panel by RT-PCR (Flu A&B, Covid) Nasopharyngeal Swab     Status: None   Collection Time: 11/02/20  4:46 AM   Specimen: Nasopharyngeal Swab; Nasopharyngeal(NP) swabs in vial transport medium  Result Value Ref Range Status   SARS Coronavirus 2 by RT PCR NEGATIVE NEGATIVE Final    Comment: (NOTE) SARS-CoV-2 target nucleic acids are NOT DETECTED.  The SARS-CoV-2 RNA is generally detectable in upper respiratory specimens during the acute phase of infection. The lowest concentration of SARS-CoV-2 viral copies this assay can detect is 138 copies/mL. A negative result does not preclude SARS-Cov-2 infection and should not be used as the sole basis for treatment or other patient management decisions. A negative result may occur with  improper specimen collection/handling, submission of specimen other than nasopharyngeal swab, presence of viral mutation(s) within the areas targeted by this assay, and inadequate number of viral copies(<138 copies/mL). A negative result must be combined with clinical observations, patient history, and epidemiological information. The expected result is Negative.  Fact Sheet for Patients:   EntrepreneurPulse.com.au  Fact Sheet for Healthcare Providers:  IncredibleEmployment.be  This test is no t yet approved or cleared by the [place] FDA and  has been authorized for detection and/or diagnosis of SARS-CoV-2 by FDA under an Emergency Use Authorization (EUA). This EUA will remain  in effect (meaning this test can be used) for the duration of the COVID-19 declaration under Section 564(b)(1) of the Act, 21 U.S.C.section 360bbb-3(b)(1), unless the authorization is terminated  or revoked sooner.       Influenza A by PCR NEGATIVE NEGATIVE Final   Influenza B by PCR NEGATIVE NEGATIVE Final    Comment: (NOTE) The Xpert Xpress SARS-CoV-2/FLU/RSV plus assay is intended as an aid in the diagnosis of influenza from Nasopharyngeal swab specimens and should not be used as a sole basis for treatment. Nasal washings and aspirates are unacceptable for Xpert Xpress SARS-CoV-2/FLU/RSV testing.  Fact Sheet for Patients: EntrepreneurPulse.com.au  Fact Sheet for Healthcare Providers: IncredibleEmployment.be  This test is not yet approved or  cleared by the [place] and has been authorized for detection and/or diagnosis of SARS-CoV-2 by FDA under an Emergency Use Authorization (EUA). This EUA will remain in effect (meaning this test can be used) for the duration of the COVID-19 declaration under Section 564(b)(1) of the Act, 21 U.S.C. section 360bbb-3(b)(1), unless the authorization is terminated or revoked.  Performed at [facility], [place] [address], [place], [place] [address]      Radiological Exams on Admission: DG Chest 2 View  Result Date: 11/02/2020 CLINICAL DATA:  75 year old male with shortness of breath for 3 days. History of lung cancer, prior lobectomy. EXAM: CHEST - 2 VIEW COMPARISON:  Restaging chest CT 05/24/2019 and earlier. FINDINGS: Chronic volume loss in the left lung from  lobectomy but otherwise underlying chronic pulmonary hyperinflation. Stable lung volumes since 2020. Mediastinal contours remain within normal limits. Visualized tracheal air column is within normal limits. No pneumothorax. No pulmonary edema. No pleural effusion suspected. EKG button artifact in both upper lungs. No acute pulmonary opacity. Stable visualized osseous structures. Negative visible bowel gas pattern. IMPRESSION: Stable chronic lung disease and postoperative changes to the left lung. No acute cardiopulmonary abnormality. Electronically Signed   By: Genevie Ann M.D.   On: 11/02/2020 05:04    EKG: Independently reviewed.  Sinus tachycardia at 110 bpm with initial QTC 516  Assessment/Plan Acute respiratory failure with hypoxia secondary to COPD exacerbation: Patient presents with progressively worsening shortness of breath and cough over the last 3 days.  O2 saturations noted to be as low as 85% on room air with improvement on 4 L nasal cannula oxygen.  Chest x-ray appeared to be stable without acute infiltrate or signs of fluid.  COVID-19 screening was negative.  He seemed to have only been on albuterol inhalers at home, but was not on any maintenance medication.  Patient had been given Solu-Medrol 125 mg IV and breathing treatments. -Admit to a telemetry bed -Continuous pulse oximetry with nasal cannula oxygen to maintain O2 saturation greater than 90% -DuoNebs 4 times daily as needed as needed for shortness of breath/wheezing -Empiric antibiotics of Rocephin IV -Solu-Medrol IV and transition to prednisone -Antitussives as needed -Recommend outpatient follow-up with patient's pulmonologist Dr. Verdie Mosher  Leukocytosis: Acute.  WBC elevated at 14.  Patient was otherwise noted to be afebrile and chest x-ray did not show any clear signs of pneumonia.  Could be related to steroids given -Continue to monitor  Prolonged QT interval: Acute.  Initial EKG noted QTC to be 516. -Try to avoid QT  prolonging medications -Recheck EKG in a.m.  Elevated troponin: Acute.  On admission high-sensitivity troponin 34->44.  Patient only reports chest discomfort with coughing.  EKG appears to be nonischemic.  Patient had a negative stress test back in 09/2018. -Continue to monitor  Essential hypertension: Blood pressures currently maintained.  Home medications include lisinopril 40 mg daily and metoprolol succinate 12.5 mg daily. -Continue current regimen -Hydralazine IV as needed in addition to current home right  History of lung cancer: Patient is status post left upper lobe resection in 10/2018 for which surgical pathology revealed non-small cell carcinoma.  -Continue outpatient follow-up with cardiothoracic surgery  Anxiety/depression: Home medications include Wellbutrin XL 300 mg daily and Celexa 40 mg daily -Continue current regimen  Hyperlipidemia: Home medications include atorvastatin 40 mg daily. -Continue statin  Tobacco abuse: Patient reports that he quit smoking few years ago, but reports secondhand exposure with wife. -Encourage patient's continued cessation of tobacco use.  DVT prophylaxis: Lovenox Code Status: Full Family Communication: Wife updated over the phone  Disposition Plan: Discharge home in 2 to 3 days Consults called: None Admission status:, Inpatient status, requiring at least 2 midnight stay due to acute respiratory failure with COPD exacerbation  Norval Morton MD [facility]   If 7PM-7AM, please contact night-coverage   11/02/2020, 11:07 AM

## 2020-11-02 NOTE — ED Notes (Addendum)
Upon assessment, patient found to be 85% on RA. Placed patient on neb treatment.

## 2020-11-03 DIAGNOSIS — J441 Chronic obstructive pulmonary disease with (acute) exacerbation: Secondary | ICD-10-CM | POA: Diagnosis not present

## 2020-11-03 LAB — BASIC METABOLIC PANEL
Anion gap: 11 (ref 5–15)
BUN: 25 mg/dL — ABNORMAL HIGH (ref 8–23)
CO2: 22 mmol/L (ref 22–32)
Calcium: 9 mg/dL (ref 8.9–10.3)
Chloride: 103 mmol/L (ref 98–111)
Creatinine, Ser: 1.21 mg/dL (ref 0.61–1.24)
GFR, Estimated: >60 mL/min (ref >60)
Glucose, Bld: 139 mg/dL — ABNORMAL HIGH (ref 70–99)
Potassium: 4.6 mmol/L (ref 3.5–5.1)
Sodium: 136 mmol/L (ref 135–145)

## 2020-11-03 LAB — CBC
HCT: 42.2 % (ref 39.0–52.0)
Hemoglobin: 13.9 g/dL (ref 13.0–17.0)
MCH: 31.8 pg (ref 26.0–34.0)
MCHC: 32.9 g/dL (ref 30.0–36.0)
MCV: 96.6 fL (ref 80.0–100.0)
Platelets: 226 10*3/uL (ref 150–400)
RBC: 4.37 MIL/uL (ref 4.22–5.81)
RDW: 13 % (ref 11.5–15.5)
WBC: 16.8 10*3/uL — ABNORMAL HIGH (ref 4.0–10.5)
nRBC: 0 % (ref 0.0–0.2)

## 2020-11-03 MED ORDER — ACETAMINOPHEN 325 MG PO TABS
650.0000 mg | ORAL_TABLET | Freq: Four times a day (QID) | ORAL | Status: DC | PRN
  Administered 2020-11-03: 650 mg via ORAL
  Filled 2020-11-03: qty 2

## 2020-11-03 MED ORDER — GUAIFENESIN-DM 100-10 MG/5ML PO SYRP
5.0000 mL | ORAL_SOLUTION | ORAL | Status: DC | PRN
  Administered 2020-11-03 (×3): 5 mL via ORAL
  Filled 2020-11-03 (×3): qty 5

## 2020-11-03 MED ORDER — IBUPROFEN 600 MG PO TABS
600.0000 mg | ORAL_TABLET | Freq: Four times a day (QID) | ORAL | Status: DC | PRN
  Administered 2020-11-03 (×2): 600 mg via ORAL
  Filled 2020-11-03 (×2): qty 1

## 2020-11-03 NOTE — Progress Notes (Signed)
SATURATION QUALIFICATIONS: (This note is used to comply with regulatory documentation for home oxygen)  Patient Saturations on Room Air at Rest = 92%  Patient Saturations on Room Air while Ambulating = 87%  Patient Saturations on 3 Liters of oxygen while Ambulating = 94% (tried 2 L, but could not get higher than 88% on 2L)  Please briefly explain why patient needs home oxygen: Pt desaturates on RA while ambulating, requiring 3 L O2 [place] during gait to maintain sats >88%.  Thanks,  Verdene Lennert, PT, DPT  Acute Rehabilitation [phone] pager [phone] office

## 2020-11-03 NOTE — Plan of Care (Signed)

## 2020-11-03 NOTE — Progress Notes (Signed)
TRH night shift telemetry coverage note.  Added Robitussin DM for cough.  Tennis Must, MD.

## 2020-11-03 NOTE — Evaluation (Signed)
Physical Therapy Evaluation Patient Details Name: Gary Wright MRN: [id number] DOB: 07-11-1946 Today's Date: 11/03/2020   History of Present Illness  75 y.o. male admitted on 11/03/20 for progressively worsening SOB.  Dx with acute hypoxic respiratory failure secondary to COPD exacerbation, leukocytosis likely secodary to steroids, prolonged QT intervial (acute), troponin elevation, likely demand ischemia.  Pt with significant PMH of lung CA, essential HTN, anxiety/depression, tobacco abuse (h/o, quit a few years ago, but wife still smokes).  Not currently on home O2. Pt also with h/o back surgery.  Clinical Impression  Pt is mildly weak and unsteady on his feet during hallway gait, but he has been not very active during the course of his acute illness at home.  He did require 3L O2 [place] to keep sats>88% during gait.  See separate O2 sat note.  DOE 3/4 with audible wheezing.  He will likely need home O2 at discharge, but is moving well enough I do not believe he needs follow up therapy.  We will follow him acutely for O2 assessment, weaning and mobility.      Follow Up Recommendations No PT follow up;Supervision - Intermittent    Equipment Recommendations  Other (comment) (home O2)    Recommendations for Other Services       Precautions / Restrictions Precautions Precautions: Other (comment) Precaution Comments: monitor O2 sats, daily ambulatory sat test      Mobility  Bed Mobility               General bed mobility comments: Pt was OOB in the recliner chair.    Transfers Overall transfer level: Needs assistance Equipment used: None Transfers: Sit to/from Stand Sit to Stand: Supervision         General transfer comment: supervision for safety  Ambulation/Gait Ambulation/Gait assistance: Supervision Gait Distance (Feet): 200 Feet Assistive device: None (occasional support for standing rest breaks from hallway railing.) Gait Pattern/deviations: Step-through  pattern;Staggering left;Staggering right Gait velocity: decreased Gait velocity interpretation: 1.31 - 2.62 ft/sec, indicative of limited community ambulator General Gait Details: mildly staggering gait pattern. See separate ambulatory sat note, needed 3L O2  to maintain sats during gait, 2 standing rest breaks and educated on pursed lip breathing technique.  Stairs            Wheelchair Mobility    Modified Rankin (Stroke Patients Only)       Balance Overall balance assessment: Mild deficits observed, not formally tested                                           Pertinent Vitals/Pain Pain Assessment: Faces Faces Pain Scale: Hurts even more Pain Location: chest/ribs Pain Descriptors / Indicators: Grimacing;Guarding Pain Intervention(s): Limited activity within patient's tolerance;Monitored during session;Repositioned    Home Living Family/patient expects to be discharged to:: Private residence Living Arrangements: Spouse/significant other Available Help at Discharge: Family;Available 24 hours/day Type of Home: House Home Access: Stairs to enter Entrance Stairs-Rails: Right Entrance Stairs-Number of Steps: 4 Home Layout: One level Home Equipment: Cane - single point      Prior Function Level of Independence: Independent         Comments: Pt independent, drives (not much anymore) does his own bathing, dressing, what housework he can help with (not much lately).     Hand Dominance   Dominant Hand: Right    Extremity/Trunk Assessment   Upper Extremity  Assessment Upper Extremity Assessment: Defer to OT evaluation    Lower Extremity Assessment Lower Extremity Assessment: Generalized weakness (from acute illness and lack of moving much over the past week)    Cervical / Trunk Assessment Cervical / Trunk Assessment: Other exceptions Cervical / Trunk Exceptions: h/o spine surgery  Communication   Communication: No difficulties   Cognition Arousal/Alertness: Awake/alert Behavior During Therapy: WFL for tasks assessed/performed Overall Cognitive Status: Within Functional Limits for tasks assessed                                        General Comments      Exercises     Assessment/Plan    PT Assessment Patient needs continued PT services  PT Problem List Decreased activity tolerance;Cardiopulmonary status limiting activity;Decreased knowledge of precautions       PT Treatment Interventions DME instruction;Stair training;Gait training;Functional mobility training;Therapeutic activities;Therapeutic exercise;Balance training;Patient/family education    PT Goals (Current goals can be found in the Care Plan section)  Acute Rehab PT Goals Patient Stated Goal: to stop having these scary breathing episodes at home PT Goal Formulation: With patient Time For Goal Achievement: 11/17/20 Potential to Achieve Goals: Good    Frequency Min 3X/week   Barriers to discharge        Co-evaluation               AM-PAC PT "6 Clicks" Mobility  Outcome Measure Help needed turning from your back to your side while in a flat bed without using bedrails?: A Little Help needed moving from lying on your back to sitting on the side of a flat bed without using bedrails?: A Little Help needed moving to and from a bed to a chair (including a wheelchair)?: A Little Help needed standing up from a chair using your arms (e.g., wheelchair or bedside chair)?: A Little Help needed to walk in hospital room?: A Little Help needed climbing 3-5 steps with a railing? : A Little 6 Click Score: 18    End of Session Equipment Utilized During Treatment: Oxygen Activity Tolerance: Patient limited by fatigue;Other (comment) (limited by DOE 3/4 during gait) Patient left: in chair;with call bell/phone within reach   PT Visit Diagnosis: Difficulty in walking, not elsewhere classified (R26.2)    Time: 7124-5809 PT Time  Calculation (min) (ACUTE ONLY): 26 min   Charges:   PT Evaluation $PT Eval Moderate Complexity: 1 Mod PT Treatments $Gait Training: 8-22 mins       Verdene Lennert, PT, DPT  Acute Rehabilitation [phone] pager [phone]) [phone] office

## 2020-11-03 NOTE — Evaluation (Signed)
Occupational Therapy Evaluation Patient Details Name: Gary Wright MRN: [id number] DOB: September 12, 1946 Today's Date: 11/03/2020    History of Present Illness 75 y.o. male admitted on 11/03/20 for progressively worsening SOB.  Dx with acute hypoxic respiratory failure secondary to COPD exacerbation, leukocytosis likely secodary to steroids, prolonged QT intervial (acute), troponin elevation, likely demand ischemia.  Pt with significant PMH of lung CA, essential HTN, anxiety/depression, tobacco abuse (h/o, quit a few years ago, but wife still smokes).  Not currently on home O2. Pt also with h/o back surgery.   Clinical Impression   PTA, pt lives with spouse and reports Independence in ADLs, some IADLs and mobility though having increasing difficulty secondary to acute illness onset. Pt presents with mild deficits in cardiopulmonary tolerance, improving with use of supplemental O2. Provided energy conservation handout and educated on strategies to use during ADLs/IADLs at home with pt verbalizing understanding. Session somewhat limited due to need for breathing treatment, so plan to assess carryover of energy conservation strategies during ADL tasks for one more session, then sign off. Anticipate no OT needs at discharge.     Follow Up Recommendations  No OT follow up    Equipment Recommendations  Other (comment) (shower chair and pulse oximeter; pt reports plan to purchase at discharge)    Recommendations for Other Services       Precautions / Restrictions Precautions Precautions: Other (comment) Precaution Comments: monitor O2 sats, daily ambulatory sat test Restrictions Weight Bearing Restrictions: No      Mobility Bed Mobility               General bed mobility comments: Pt was OOB in the recliner chair.    Transfers Overall transfer level: Needs assistance Equipment used: None Transfers: Sit to/from Stand Sit to Stand: Supervision         General transfer comment:  supervision for safety    Balance Overall balance assessment: Mild deficits observed, not formally tested                                         ADL either performed or assessed with clinical judgement   ADL Overall ADL's : Needs assistance/impaired Eating/Feeding: Independent;Sitting   Grooming: Supervision/safety;Standing   Upper Body Bathing: Independent;Sitting   Lower Body Bathing: Sit to/from stand;Supervison/ safety Lower Body Bathing Details (indicate cue type and reason): Encouraged to complete this task while seated for energy conservation Upper Body Dressing : Independent;Sitting   Lower Body Dressing: Set up;Sit to/from stand Lower Body Dressing Details (indicate cue type and reason): Pt able to demo figure four position for mgmt of socks Toilet Transfer: Supervision/safety;Ambulation   Toileting- Clothing Manipulation and Hygiene: Set up;Sit to/from stand       Functional mobility during ADLs: Supervision/safety General ADL Comments: Limitations due to decreased cardiopulmonary tolerance but improving with use of supplemental O2 per pt     Vision Baseline Vision/History: Wears glasses Wears Glasses: At all times Patient Visual Report: No change from baseline Vision Assessment?: No apparent visual deficits     Perception     Praxis      Pertinent Vitals/Pain Pain Assessment: Faces Faces Pain Scale: Hurts little more Pain Location: chest/ribs when coughing Pain Descriptors / Indicators: Grimacing;Guarding Pain Intervention(s): Monitored during session;Other (comment) (RN notified)     Hand Dominance Right   Extremity/Trunk Assessment Upper Extremity Assessment Upper Extremity Assessment: Overall WFL for tasks assessed  Lower Extremity Assessment Lower Extremity Assessment: Defer to PT evaluation   Cervical / Trunk Assessment Cervical / Trunk Assessment: Other exceptions Cervical / Trunk Exceptions: h/o spine surgery    Communication Communication Communication: No difficulties   Cognition Arousal/Alertness: Awake/alert Behavior During Therapy: WFL for tasks assessed/performed Overall Cognitive Status: Within Functional Limits for tasks assessed                                     General Comments  Pt received on 2 L O2, 95% at rest and when conversing. Session somewhat limited due to need for breathing treatment setup by RT. However, noted pt recently ambulated with PT and did well though fatigued. Provided energy conservation education with handout    Exercises     Shoulder Instructions      Home Living Family/patient expects to be discharged to:: Private residence Living Arrangements: Spouse/significant other Available Help at Discharge: Family;Available 24 hours/day Type of Home: House Home Access: Stairs to enter [employer] of Steps: 4 Entrance Stairs-Rails: Right Home Layout: One level     Bathroom Shower/Tub: Tub/shower unit;Walk-in shower   Bathroom Toilet: Standard     Home Equipment: Cane - single point          Prior Functioning/Environment Level of Independence: Independent        Comments: Pt independent, drives (not much anymore) does his own bathing, dressing, what housework he can help with (not much lately). Wife typically cooks, he assists with the cleaning        OT Problem List: Decreased activity tolerance;Impaired balance (sitting and/or standing);Decreased strength;Cardiopulmonary status limiting activity      OT Treatment/Interventions: Self-care/ADL training;Therapeutic exercise;Energy conservation;Therapeutic activities;Patient/family education;Balance training    OT Goals(Current goals can be found in the care plan section) Acute Rehab OT Goals Patient Stated Goal: to stop having these scary breathing episodes at home OT Goal Formulation: With patient Time For Goal Achievement: 11/17/20 Potential to Achieve Goals:  Good ADL Goals Pt Will Transfer to Toilet: Independently;ambulating Additional ADL Goal #1: Pt to demonstrate ADLs standing at sink Independently > 3 minutes without rest break and while implementing pursed lip breathing Additional ADL Goal #2: Pt to demonstrate implementation of at least 2 energy conservation strategies during daily tasks  OT Frequency: Min 2X/week   Barriers to D/C:            Co-evaluation              AM-PAC OT "6 Clicks" Daily Activity     Outcome Measure Help from another person eating meals?: None Help from another person taking care of personal grooming?: A Little Help from another person toileting, which includes using toliet, bedpan, or urinal?: A Little Help from another person bathing (including washing, rinsing, drying)?: A Little Help from another person to put on and taking off regular upper body clothing?: None Help from another person to put on and taking off regular lower body clothing?: A Little 6 Click Score: 20   End of Session Equipment Utilized During Treatment: Oxygen Nurse Communication: Patient requests pain meds  Activity Tolerance: Patient tolerated treatment well Patient left: in chair;with call bell/phone within reach;Other (comment) (with RT)  OT Visit Diagnosis: Unsteadiness on feet (R26.81);Muscle weakness (generalized) (M62.81)                Time: 4259-5638 OT Time Calculation (min): 13 min Charges:  OT General Charges $  OT Visit: 1 Visit OT Evaluation $OT Eval Low Complexity: 1 Low  Layla Maw, OTR/L  Layla Maw 11/03/2020, 1:06 PM

## 2020-11-03 NOTE — Progress Notes (Signed)
PROGRESS NOTE    Gary Wright  IWL:[id number] DOB: 1946/02/12 DOA: 11/02/2020 PCP: Gaye Alken, PA-C   Brief Narrative:  HPI: Gary Wright is a 74 y.o. male with medical history significant of hypertension, COPD, lung cancer s/p LUL lobectomy on 10/30/2018, and remote tobacco use who presents with complaints of progressively worsening shortness of breath over the last 3 days.  He does not require oxygen at baseline.  Patient reports that he chronically has a cough, but it was more frequent and reported increased sputum production.  Sputum was noted to be light brown in color.  Any kind of exertion made him feel as though he was not able to get any air in or out due to the tightness in his chest.  Normally he would only use his albuterol inhaler once or twice a day, but was using it several times within an hour without any relief.  Associated symptoms include chest discomfort secondary to coughing.  He reports that he quit smoking a few years ago, but notes that his wife still smokes in the house.  Denies having any significant fever, nausea, vomiting, abdominal pain, diarrhea, myalgias, or loss of consciousness.  He was having such a difficult time breathing this morning they felt like he could pass out or that he may die.    In route with EMS patient was noted to be hypoxic and had been given Solu-Medrol 125 mg IV and albuterol breathing treatments.  ED Course: Upon admission into the emergency department patient was afebrile, pulse 10 4-114, and O2 saturation noted to be as low as 85% on room air with improvement on 2 L nasal cannula oxygen.  Labs significant for WBC 14 and high-sensitivity troponin 34->44.  Influenza and COVID-19 screening were both negative.  Chest x-ray showed stable chronic chronic lung disease with postoperative changes of the left lung.  Patient had received multiple breathing treatments, and 2 g of magnesium sulfate while in the emergency department.  Assessment &  Plan:   Principal Problem:   COPD exacerbation ([place]) Active Problems:   Depression   Essential hypertension   Elevated troponin   History of lung cancer   Prolonged QT interval   Hyperlipidemia   Acute respiratory failure with hypoxia (HCC)  Acute hypoxic respiratory failure secondary to COPD exacerbation: Still with shortness of breath but feeling overall better than yesterday.  Requiring 2 L of oxygen.  Does not use any home oxygen.  Has audible expiratory wheezes.  Continue current prednisone, antibiotics and bronchodilators.  Leukocytosis: Likely reactive and now worse due to steroids.  No clear signs of infection.  Monitor.  He is afebrile.  Essential hypertension: Blood pressure controlled.  Continue lisinopril.  Prolonged QT interval: Acute.  Initial EKG noted QTC to be 516. -Try to avoid QT prolonging medications -Recheck EKG in a.m.  Elevated troponin: Minimal elevation with flat numbers.  Not indicative of ACS.  Likely demand ischemia due to acute hypoxic respiratory failure.  History of lung cancer: Patient is status post left upper lobe resection in 10/2018 for which surgical pathology revealed non-small cell carcinoma.  -Continue outpatient follow-up with cardiothoracic surgery  Anxiety/depression: Home medications include Wellbutrin XL 300 mg daily and Celexa 40 mg daily -Continue current regimen  Hyperlipidemia: Home medications include atorvastatin 40 mg daily. -Continue statin  Tobacco abuse: Patient reports that he quit smoking few years ago, but reports secondhand exposure with wife.  Provided education around that.  DVT prophylaxis: enoxaparin (LOVENOX) injection 40 mg Start: 11/02/20 1200  Code Status: Full Code  Family Communication:  None present at bedside.  Plan of care discussed with patient in length and he verbalized understanding and agreed with it.  Status is: Inpatient  Remains inpatient appropriate because:Inpatient level of care  appropriate due to severity of illness   Dispo: The patient is from: Home              Anticipated d/c is to: Home              Anticipated d/c date is: 1 day              Patient currently is not medically stable to d/c.        Estimated body mass index is 28.86 kg/m as calculated from the following:   Height as of this encounter: 5\' 8"  (1.727 m).   Weight as of this encounter: 86.1 kg.      Nutritional status:               Consultants:   None  Procedures:   None  Antimicrobials:  Anti-infectives (From admission, onward)   Start     Dose/Rate Route Frequency Ordered Stop   11/02/20 1200  cefTRIAXone (ROCEPHIN) 1 g in sodium chloride 0.9 % 100 mL IVPB        1 g 200 mL/hr over 30 Minutes Intravenous Every 24 hours 11/02/20 1157           Subjective: Seen and examined.  Still complains of shortness of breath but improving.  Coughing.  No other complaint.  Objective: Vitals:   11/02/20 2139 11/03/20 0628 11/03/20 0743 11/03/20 1025  BP: 126/76 125/81  (!) 118/58  Pulse: 99 78 82 77  Resp: 18 18  19   Temp: 98.1 F (36.7 C) 97.7 F (36.5 C)  98.1 F (36.7 C)  TempSrc: Oral Oral  Oral  SpO2: 96% 96%  95%  Weight:      Height:        Intake/Output Summary (Last 24 hours) at 11/03/2020 1109 Last data filed at 11/03/2020 0600 Gross per 24 hour  Intake 720 ml  Output --  Net 720 ml   Filed Weights   11/02/20 1448  Weight: 86.1 kg    Examination:  General exam: Appears calm and comfortable  Respiratory system: Audible expiratory wheezes. Respiratory effort normal. Cardiovascular system: S1 & S2 heard, RRR. No JVD, murmurs, rubs, gallops or clicks. No pedal edema. Gastrointestinal system: Abdomen is nondistended, soft and nontender. No organomegaly or masses felt. Normal bowel sounds heard. Central nervous system: Alert and oriented. No focal neurological deficits. Extremities: Symmetric 5 x 5 power. Skin: No rashes, lesions or  ulcers Psychiatry: Judgement and insight appear normal. Mood & affect appropriate.    Data Reviewed: I have personally reviewed following labs and imaging studies  CBC: Recent Labs  Lab 11/02/20 0446 11/03/20 0349  WBC 14.0* 16.8*  HGB 14.5 13.9  HCT 45.2 42.2  MCV 98.0 96.6  PLT 252 269   Basic Metabolic Panel: Recent Labs  Lab 11/02/20 0446 11/03/20 0349  NA 138 136  K 4.2 4.6  CL 103 103  CO2 23 22  GLUCOSE 156* 139*  BUN 15 25*  CREATININE 1.13 1.21  CALCIUM 9.0 9.0   GFR: Estimated Creatinine Clearance: 57.2 mL/min (by C-G formula based on SCr of 1.21 mg/dL). Liver Function Tests: No results for input(s): AST, ALT, ALKPHOS, BILITOT, PROT, ALBUMIN in the last 168 hours. No results for input(s): LIPASE, AMYLASE  in the last 168 hours. No results for input(s): AMMONIA in the last 168 hours. Coagulation Profile: No results for input(s): INR, PROTIME in the last 168 hours. Cardiac Enzymes: No results for input(s): CKTOTAL, CKMB, CKMBINDEX, TROPONINI in the last 168 hours. BNP (last 3 results) No results for input(s): PROBNP in the last 8760 hours. HbA1C: No results for input(s): HGBA1C in the last 72 hours. CBG: No results for input(s): GLUCAP in the last 168 hours. Lipid Profile: No results for input(s): CHOL, HDL, LDLCALC, TRIG, CHOLHDL, LDLDIRECT in the last 72 hours. Thyroid Function Tests: No results for input(s): TSH, T4TOTAL, FREET4, T3FREE, THYROIDAB in the last 72 hours. Anemia Panel: No results for input(s): VITAMINB12, FOLATE, FERRITIN, TIBC, IRON, RETICCTPCT in the last 72 hours. Sepsis Labs: No results for input(s): PROCALCITON, LATICACIDVEN in the last 168 hours.  Recent Results (from the past 240 hour(s))  Resp Panel by RT-PCR (Flu A&B, Covid) Nasopharyngeal Swab     Status: None   Collection Time: 11/02/20  4:46 AM   Specimen: Nasopharyngeal Swab; Nasopharyngeal(NP) swabs in vial transport medium  Result Value Ref Range Status   SARS  Coronavirus 2 by RT PCR NEGATIVE NEGATIVE Final    Comment: (NOTE) SARS-CoV-2 target nucleic acids are NOT DETECTED.  The SARS-CoV-2 RNA is generally detectable in upper respiratory specimens during the acute phase of infection. The lowest concentration of SARS-CoV-2 viral copies this assay can detect is 138 copies/mL. A negative result does not preclude SARS-Cov-2 infection and should not be used as the sole basis for treatment or other patient management decisions. A negative result may occur with  improper specimen collection/handling, submission of specimen other than nasopharyngeal swab, presence of viral mutation(s) within the areas targeted by this assay, and inadequate number of viral copies(<138 copies/mL). A negative result must be combined with clinical observations, patient history, and epidemiological information. The expected result is Negative.  Fact Sheet for Patients:  EntrepreneurPulse.com.au  Fact Sheet for Healthcare Providers:  IncredibleEmployment.be  This test is no t yet approved or cleared by the [place] FDA and  has been authorized for detection and/or diagnosis of SARS-CoV-2 by FDA under an Emergency Use Authorization (EUA). This EUA will remain  in effect (meaning this test can be used) for the duration of the COVID-19 declaration under Section 564(b)(1) of the Act, 21 U.S.C.section 360bbb-3(b)(1), unless the authorization is terminated  or revoked sooner.       Influenza A by PCR NEGATIVE NEGATIVE Final   Influenza B by PCR NEGATIVE NEGATIVE Final    Comment: (NOTE) The Xpert Xpress SARS-CoV-2/FLU/RSV plus assay is intended as an aid in the diagnosis of influenza from Nasopharyngeal swab specimens and should not be used as a sole basis for treatment. Nasal washings and aspirates are unacceptable for Xpert Xpress SARS-CoV-2/FLU/RSV testing.  Fact Sheet for  Patients: EntrepreneurPulse.com.au  Fact Sheet for Healthcare Providers: IncredibleEmployment.be  This test is not yet approved or cleared by the [place] FDA and has been authorized for detection and/or diagnosis of SARS-CoV-2 by FDA under an Emergency Use Authorization (EUA). This EUA will remain in effect (meaning this test can be used) for the duration of the COVID-19 declaration under Section 564(b)(1) of the Act, 21 U.S.C. section 360bbb-3(b)(1), unless the authorization is terminated or revoked.  Performed at [facility], [place] [address] [place], [place] [address]       Radiology Studies: DG Chest 2 View  Result Date: 11/02/2020 CLINICAL DATA:  75 year old male with shortness  of breath for 3 days. History of lung cancer, prior lobectomy. EXAM: CHEST - 2 VIEW COMPARISON:  Restaging chest CT 05/24/2019 and earlier. FINDINGS: Chronic volume loss in the left lung from lobectomy but otherwise underlying chronic pulmonary hyperinflation. Stable lung volumes since 2020. Mediastinal contours remain within normal limits. Visualized tracheal air column is within normal limits. No pneumothorax. No pulmonary edema. No pleural effusion suspected. EKG button artifact in both upper lungs. No acute pulmonary opacity. Stable visualized osseous structures. Negative visible bowel gas pattern. IMPRESSION: Stable chronic lung disease and postoperative changes to the left lung. No acute cardiopulmonary abnormality. Electronically Signed   By: Genevie Ann M.D.   On: 11/02/2020 05:04    Scheduled Meds: . aspirin  81 mg Oral Daily  . atorvastatin  40 mg Oral Daily  . enoxaparin (LOVENOX) injection  40 mg Subcutaneous Q24H  . guaiFENesin  600 mg Oral BID  . ipratropium-albuterol  3 mL Nebulization QID  . lisinopril  40 mg Oral QPM  . metoprolol succinate  12.5 mg Oral Q1200  . predniSONE  40 mg Oral Q breakfast  . sodium chloride flush  3 mL Intravenous Q12H    Continuous Infusions: . cefTRIAXone (ROCEPHIN)  IV 1 g (11/03/20 1031)     LOS: 1 day   Time spent: 35 minutes   Darliss Cheney, MD [facility]  11/03/2020, 11:09 AM   To contact the attending provider between 7A-7P or the covering provider during after hours 7P-7A, please log into the web site [email or web address].

## 2020-11-04 DIAGNOSIS — J441 Chronic obstructive pulmonary disease with (acute) exacerbation: Secondary | ICD-10-CM | POA: Diagnosis not present

## 2020-11-04 LAB — CBC WITH DIFFERENTIAL/PLATELET
Abs Immature Granulocytes: 0.07 10*3/uL (ref 0.00–0.07)
Basophils Absolute: 0 10*3/uL (ref 0.0–0.1)
Basophils Relative: 0 %
Eosinophils Absolute: 0.1 10*3/uL (ref 0.0–0.5)
Eosinophils Relative: 1 %
HCT: 40.7 % (ref 39.0–52.0)
Hemoglobin: 13.8 g/dL (ref 13.0–17.0)
Immature Granulocytes: 1 %
Lymphocytes Relative: 11 %
Lymphs Abs: 1.7 10*3/uL (ref 0.7–4.0)
MCH: 32.8 pg (ref 26.0–34.0)
MCHC: 33.9 g/dL (ref 30.0–36.0)
MCV: 96.7 fL (ref 80.0–100.0)
Monocytes Absolute: 1.3 10*3/uL — ABNORMAL HIGH (ref 0.1–1.0)
Monocytes Relative: 8 %
Neutro Abs: 11.7 10*3/uL — ABNORMAL HIGH (ref 1.7–7.7)
Neutrophils Relative %: 79 %
Platelets: 264 10*3/uL (ref 150–400)
RBC: 4.21 MIL/uL — ABNORMAL LOW (ref 4.22–5.81)
RDW: 13.2 % (ref 11.5–15.5)
WBC: 14.8 10*3/uL — ABNORMAL HIGH (ref 4.0–10.5)
nRBC: 0 % (ref 0.0–0.2)

## 2020-11-04 MED ORDER — HYDROCOD POLST-CPM POLST ER 10-8 MG/5ML PO SUER: 5.0000 mL | Freq: Two times a day (BID) | ORAL | 0 refills | Status: DC | PRN

## 2020-11-04 MED ORDER — ALBUTEROL SULFATE HFA 108 (90 BASE) MCG/ACT IN AERS: 2.0000 | INHALATION_SPRAY | Freq: Four times a day (QID) | RESPIRATORY_TRACT | 2 refills | Status: DC | PRN

## 2020-11-04 MED ORDER — GUAIFENESIN ER 600 MG PO TB12: 600.0000 mg | ORAL_TABLET | Freq: Two times a day (BID) | ORAL | 0 refills | Status: AC

## 2020-11-04 MED ORDER — TRELEGY ELLIPTA 100-62.5-25 MCG/INH IN AEPB: 1.0000 | INHALATION_SPRAY | Freq: Every day | RESPIRATORY_TRACT | 2 refills | Status: DC

## 2020-11-04 MED ORDER — PREDNISONE 10 MG PO TABS: ORAL_TABLET | ORAL | 0 refills | Status: DC

## 2020-11-04 NOTE — Discharge Summary (Signed)
Physician Discharge Summary  Ronte Parker SEG:[id number] DOB: Feb 17, 1946 DOA: 11/02/2020  PCP: Gaye Alken, PA-C  Admit date: 11/02/2020 Discharge date: 11/04/2020  Admitted From: Home Disposition: Home  Recommendations for Outpatient Follow-up:  1. Follow up with PCP in 1-2 weeks 2. Schedule follow-up with your lung doctor.  Home Health: Not applicable Equipment/Devices: Oxygen 2 L  Discharge Condition: Stable CODE STATUS: Full code Diet recommendation: Low-salt diet  Discharge summary: 75 year old gentleman with history of hypertension, COPD, lung cancer status post left upper lobe lobectomy on 1 07/2019 who presented to the hospital with progressive worsening shortness of breath for last 3 days.  Does not use oxygen at baseline.  No fever.  In the emergency room,Afebrile.  Tachycardic.  85% on room air.  COVID-19 negative.  Chest x-ray with chronic lung disease.  Treated as COPD exacerbation.  Acute hypoxemic respiratory failure secondary to COPD exacerbation: Treated with aggressive IV steroids, oral steroids, steroid inhaler and bronchodilator therapy as well as chest pressure therapy.  Clinically improving. Still using oxygen, mobilized with hypoxia less than 88% on room air, improved with 2 L supplemental oxygen.  Prescribed. Patient is going home on prolonged prednisone taper for 2 weeks, he is already on trelegy Ellipta and albuterol regimen. Received 3 days of antibiotics, no indication to continue antibiotics. He will call and schedule follow-up with his cardiologist at [facility]. Other chronic medical issues remained stable.    Discharge Diagnoses:  Principal Problem:   COPD exacerbation ([place]) Active Problems:   Depression   Essential hypertension   Elevated troponin   History of lung cancer   Prolonged QT interval   Hyperlipidemia   Acute respiratory failure with hypoxia [facility])    Discharge Instructions  Discharge Instructions    Call MD  for:  difficulty breathing, headache or visual disturbances   Complete by: As directed    Diet - low sodium heart healthy   Complete by: As directed    Discharge instructions   Complete by: As directed    Call and schedule follow-up with your lung doctor.   Increase activity slowly   Complete by: As directed      Allergies as of 11/04/2020   No Known Allergies     Medication List    TAKE these medications   albuterol 108 (90 Base) MCG/ACT inhaler Commonly known as: VENTOLIN HFA Inhale 2 puffs into the lungs every 6 (six) hours as needed for wheezing or shortness of breath. What changed:   when to take this  reasons to take this   aspirin 81 MG chewable tablet CHEW AND SWALLOW 1 TABLET(81 MG) BY MOUTH DAILY What changed: See the new instructions.   atorvastatin 40 MG tablet Commonly known as: LIPITOR TAKE 1 TABLET(40 MG) BY MOUTH DAILY AT 6 PM What changed: See the new instructions.   buPROPion 300 MG 24 hr tablet Commonly known as: WELLBUTRIN XL Take 300 mg by mouth daily.   chlorpheniramine-HYDROcodone 10-8 MG/5ML Suer Commonly known as: TUSSIONEX Take 5 mLs by mouth every 12 (twelve) hours as needed for cough.   citalopram 40 MG tablet Commonly known as: CELEXA Take 40 mg by mouth daily.   guaiFENesin 600 MG 12 hr tablet Commonly known as: MUCINEX Take 1 tablet (600 mg total) by mouth 2 (two) times daily for 7 days.   ibuprofen 800 MG tablet Commonly known as: ADVIL Take 1 tablet (800 mg total) by mouth every 8 (eight) hours as needed. What changed: reasons to take this  lisinopril 40 MG tablet Commonly known as: ZESTRIL TAKE 1 TABLET(40 MG) BY MOUTH DAILY What changed:   how much to take  how to take this  when to take this  additional instructions   metoprolol succinate 25 MG 24 hr tablet Commonly known as: TOPROL-XL TAKE 1/2 TABLET(12.5 MG) BY MOUTH DAILY What changed: See the new instructions.   predniSONE 10 MG tablet Commonly known  as: DELTASONE 4 tabs daily for 3 days, 3 tabs daily for 3 days , 2 tabs daily for 3 days , 1 tab daily for 3 days   Trelegy Ellipta 100-62.5-25 MCG/INH Aepb Generic drug: Fluticasone-Umeclidin-Vilant Inhale 1 puff into the lungs daily.            Durable Medical Equipment  (From admission, onward)         Start     Ordered   11/04/20 0857  For home use only DME oxygen  Once       Comments: Patient Saturations on Room Air at Rest = 92%  Patient Saturations on Room Air while Ambulating = 87%  Patient Saturations on 3 Liters of oxygen while Ambulating = 94% (tried 2 L, but could not get higher than 88% on 2L)  Question Answer Comment  Length of Need Lifetime   Mode or (Route) Nasal cannula   Liters per Minute 2   Frequency Continuous (stationary and portable oxygen unit needed)   Oxygen conserving device Yes   Oxygen delivery system Gas      11/04/20 0856          Follow-up Information    Althisar, Mallie Mussel, PA-C Follow up in 1 week(s).   Specialty: Physician Assistant Contact information: [place] [address] [phone]        Gwenevere Ghazi, MD Follow up in 1 week(s).   Specialty: Pulmonary Disease Contact information: [facility] [place] [address] [phone]              No Known Allergies  Consultations:  None   Procedures/Studies: DG Chest 2 View  Result Date: 11/02/2020 CLINICAL DATA:  75 year old male with shortness of breath for 3 days. History of lung cancer, prior lobectomy. EXAM: CHEST - 2 VIEW COMPARISON:  Restaging chest CT 05/24/2019 and earlier. FINDINGS: Chronic volume loss in the left lung from lobectomy but otherwise underlying chronic pulmonary hyperinflation. Stable lung volumes since 2020. Mediastinal contours remain within normal limits. Visualized tracheal air column is within normal limits. No pneumothorax. No pulmonary edema. No pleural effusion suspected. EKG button  artifact in both upper lungs. No acute pulmonary opacity. Stable visualized osseous structures. Negative visible bowel gas pattern. IMPRESSION: Stable chronic lung disease and postoperative changes to the left lung. No acute cardiopulmonary abnormality. Electronically Signed   By: Genevie Ann M.D.   On: 11/02/2020 05:04    (Echo, Carotid, EGD, Colonoscopy, ERCP)    Subjective: Patient seen and examined.  No overnight events.  Still has occasional bronchospasm and wheezing but feels much better.  Eager to go home.  Waiting for oxygen delivery.   Discharge Exam: Vitals:   11/04/20 0651 11/04/20 1139  BP: 138/66   Pulse: 66   Resp: 16   Temp: 98.3 F (36.8 C)   SpO2: 99% 98%   Vitals:   11/03/20 2055 11/03/20 2144 11/04/20 0651 11/04/20 1139  BP:  124/68 138/66   Pulse:  95 66   Resp:  18 16   Temp:  97.9 F (36.6  C) 98.3 F (36.8 C)   TempSrc:  Oral Oral   SpO2: 97% 100% 99% 98%  Weight:      Height:        General: Pt is alert, awake, not in acute distress On 2 L oxygen.  Able to talk in full sentences. Cardiovascular: RRR, S1/S2 +, no rubs, no gallops Respiratory: CTA bilaterally, no wheezing, no rhonchi, no added sounds today. Abdominal: Soft, NT, ND, bowel sounds + Extremities: no edema, no cyanosis    The results of significant diagnostics from this hospitalization (including imaging, microbiology, ancillary and laboratory) are listed below for reference.     Microbiology: Recent Results (from the past 240 hour(s))  Resp Panel by RT-PCR (Flu A&B, Covid) Nasopharyngeal Swab     Status: None   Collection Time: 11/02/20  4:46 AM   Specimen: Nasopharyngeal Swab; Nasopharyngeal(NP) swabs in vial transport medium  Result Value Ref Range Status   SARS Coronavirus 2 by RT PCR NEGATIVE NEGATIVE Final    Comment: (NOTE) SARS-CoV-2 target nucleic acids are NOT DETECTED.  The SARS-CoV-2 RNA is generally detectable in upper respiratory specimens during the acute phase of  infection. The lowest concentration of SARS-CoV-2 viral copies this assay can detect is 138 copies/mL. A negative result does not preclude SARS-Cov-2 infection and should not be used as the sole basis for treatment or other patient management decisions. A negative result may occur with  improper specimen collection/handling, submission of specimen other than nasopharyngeal swab, presence of viral mutation(s) within the areas targeted by this assay, and inadequate number of viral copies(<138 copies/mL). A negative result must be combined with clinical observations, patient history, and epidemiological information. The expected result is Negative.  Fact Sheet for Patients:  EntrepreneurPulse.com.au  Fact Sheet for Healthcare Providers:  IncredibleEmployment.be  This test is no t yet approved or cleared by the [place] FDA and  has been authorized for detection and/or diagnosis of SARS-CoV-2 by FDA under an Emergency Use Authorization (EUA). This EUA will remain  in effect (meaning this test can be used) for the duration of the COVID-19 declaration under Section 564(b)(1) of the Act, 21 U.S.C.section 360bbb-3(b)(1), unless the authorization is terminated  or revoked sooner.       Influenza A by PCR NEGATIVE NEGATIVE Final   Influenza B by PCR NEGATIVE NEGATIVE Final    Comment: (NOTE) The Xpert Xpress SARS-CoV-2/FLU/RSV plus assay is intended as an aid in the diagnosis of influenza from Nasopharyngeal swab specimens and should not be used as a sole basis for treatment. Nasal washings and aspirates are unacceptable for Xpert Xpress SARS-CoV-2/FLU/RSV testing.  Fact Sheet for Patients: EntrepreneurPulse.com.au  Fact Sheet for Healthcare Providers: IncredibleEmployment.be  This test is not yet approved or cleared by the [place] FDA and has been authorized for detection and/or diagnosis of SARS-CoV-2  by FDA under an Emergency Use Authorization (EUA). This EUA will remain in effect (meaning this test can be used) for the duration of the COVID-19 declaration under Section 564(b)(1) of the Act, 21 U.S.C. section 360bbb-3(b)(1), unless the authorization is terminated or revoked.  Performed at [facility], [place] [address], [place], [place] [address]      Labs: BNP (last 3 results) No results for input(s): BNP in the last 8760 hours. Basic Metabolic Panel: Recent Labs  Lab 11/02/20 0446 11/03/20 0349  NA 138 136  K 4.2 4.6  CL 103 103  CO2 23 22  GLUCOSE 156* 139*  BUN 15 25*  CREATININE 1.13 1.21  CALCIUM 9.0 9.0   Liver Function Tests: No results for input(s): AST, ALT, ALKPHOS, BILITOT, PROT, ALBUMIN in the last 168 hours. No results for input(s): LIPASE, AMYLASE in the last 168 hours. No results for input(s): AMMONIA in the last 168 hours. CBC: Recent Labs  Lab 11/02/20 0446 11/03/20 0349 11/04/20 0112  WBC 14.0* 16.8* 14.8*  NEUTROABS  --   --  11.7*  HGB 14.5 13.9 13.8  HCT 45.2 42.2 40.7  MCV 98.0 96.6 96.7  PLT 252 226 264   Cardiac Enzymes: No results for input(s): CKTOTAL, CKMB, CKMBINDEX, TROPONINI in the last 168 hours. BNP: Invalid input(s): POCBNP CBG: No results for input(s): GLUCAP in the last 168 hours. D-Dimer No results for input(s): DDIMER in the last 72 hours. Hgb A1c No results for input(s): HGBA1C in the last 72 hours. Lipid Profile No results for input(s): CHOL, HDL, LDLCALC, TRIG, CHOLHDL, LDLDIRECT in the last 72 hours. Thyroid function studies No results for input(s): TSH, T4TOTAL, T3FREE, THYROIDAB in the last 72 hours.  Invalid input(s): FREET3 Anemia work up No results for input(s): VITAMINB12, FOLATE, FERRITIN, TIBC, IRON, RETICCTPCT in the last 72 hours. Urinalysis No results found for: COLORURINE, APPEARANCEUR, [place], [place], GLUCOSEU, [place], [place], [place], PROTEINUR, UROBILINOGEN, NITRITE,  LEUKOCYTESUR Sepsis Labs Invalid input(s): PROCALCITONIN,  WBC,  LACTICIDVEN Microbiology Recent Results (from the past 240 hour(s))  Resp Panel by RT-PCR (Flu A&B, Covid) Nasopharyngeal Swab     Status: None   Collection Time: 11/02/20  4:46 AM   Specimen: Nasopharyngeal Swab; Nasopharyngeal(NP) swabs in vial transport medium  Result Value Ref Range Status   SARS Coronavirus 2 by RT PCR NEGATIVE NEGATIVE Final    Comment: (NOTE) SARS-CoV-2 target nucleic acids are NOT DETECTED.  The SARS-CoV-2 RNA is generally detectable in upper respiratory specimens during the acute phase of infection. The lowest concentration of SARS-CoV-2 viral copies this assay can detect is 138 copies/mL. A negative result does not preclude SARS-Cov-2 infection and should not be used as the sole basis for treatment or other patient management decisions. A negative result may occur with  improper specimen collection/handling, submission of specimen other than nasopharyngeal swab, presence of viral mutation(s) within the areas targeted by this assay, and inadequate number of viral copies(<138 copies/mL). A negative result must be combined with clinical observations, patient history, and epidemiological information. The expected result is Negative.  Fact Sheet for Patients:  EntrepreneurPulse.com.au  Fact Sheet for Healthcare Providers:  IncredibleEmployment.be  This test is no t yet approved or cleared by the [place] FDA and  has been authorized for detection and/or diagnosis of SARS-CoV-2 by FDA under an Emergency Use Authorization (EUA). This EUA will remain  in effect (meaning this test can be used) for the duration of the COVID-19 declaration under Section 564(b)(1) of the Act, 21 U.S.C.section 360bbb-3(b)(1), unless the authorization is terminated  or revoked sooner.       Influenza A by PCR NEGATIVE NEGATIVE Final   Influenza B by PCR NEGATIVE NEGATIVE  Final    Comment: (NOTE) The Xpert Xpress SARS-CoV-2/FLU/RSV plus assay is intended as an aid in the diagnosis of influenza from Nasopharyngeal swab specimens and should not be used as a sole basis for treatment. Nasal washings and aspirates are unacceptable for Xpert Xpress SARS-CoV-2/FLU/RSV testing.  Fact Sheet for Patients: EntrepreneurPulse.com.au  Fact Sheet for Healthcare Providers: IncredibleEmployment.be  This test is not yet approved or cleared by the [place] and has been authorized for  detection and/or diagnosis of SARS-CoV-2 by FDA under an Emergency Use Authorization (EUA). This EUA will remain in effect (meaning this test can be used) for the duration of the COVID-19 declaration under Section 564(b)(1) of the Act, 21 U.S.C. section 360bbb-3(b)(1), unless the authorization is terminated or revoked.  Performed at [facility], [place] [address], [place], [place] [address]      Time coordinating discharge: 35 minutes  SIGNED:   Barb Merino, MD  [facility] 11/04/2020, 1:07 PM

## 2020-11-04 NOTE — TOC Initial Note (Signed)
Transition of Care [facility]) - Initial/Assessment Note    Patient Details  Name: Gary Wright MRN: [id number] Date of Birth: Sep 14, 1946  Transition of Care [facility]) CM/SW Contact:    Gary Crews, RN Phone Number: [phone] 11/04/2020, 9:42 AM  Clinical Narrative:                  Spoke with patient on his room phone to discuss transition plans for today. Patient reported "feeling tired and weak, but breathing better than when he came in." Discussed need for home oxygen and offered choice for DME providers. No preference. Verbal consent to speak with his wife, Gary Wright. Spoke with Gary Wright on [employer]. No preference for DME providers. Referral accepted by [facility] for home oxygen - DME order in place and pulm note written yesterday. Patient to reschedule appointment with pulmonologist. Spouse stated that he missed an appointment d/t hospitalization. Reviewed dc medications with spouse with emphasis on prednisone taper and how to take. Spouse verbalized understanding. Spouse to provide transportation home. No further TOC needs identified.   Expected Discharge Plan: Home/Self Care Barriers to Discharge: No Barriers Identified   Patient Goals and CMS Choice Patient states their goals for this hospitalization and ongoing recovery are:: return home with wife and home oxygen CMS Medicare.gov Compare Post Acute Care list provided to:: Patient Choice offered to / list presented to : [facility]  Expected Discharge Plan and Services Expected Discharge Plan: Home/Self Care In-house Referral: NA Discharge Planning Services: CM Consult Post Acute Care Choice: Durable Medical Equipment Living arrangements for the past 2 months: Single Family Home Expected Discharge Date: 11/04/20               DME Arranged: Oxygen DME Agency: [employer] Date DME Agency Contacted: 11/04/20 Time DME Agency Contacted: [phone] Representative spoke with at DME Agency: Gary Wright HH Arranged: NA [place] Agency Agency: NA         Prior Living Arrangements/Services Living arrangements for the past 2 months: [place] Lives with:: Self,Spouse Patient language and need for interpreter reviewed:: Yes Do you feel safe going back to the place where you live?: Yes      Need for Family Participation in Patient Care: Yes (Comment) Care giver support system in place?: Yes (comment)   Criminal Activity/Legal Involvement Pertinent to Current Situation/Hospitalization: No - Comment as needed  Activities of Daily Living Home Assistive Devices/Equipment: None ADL Screening (condition at time of admission) Patient's cognitive ability adequate to safely complete daily activities?: Yes Is the patient deaf or have difficulty hearing?: No Does the patient have difficulty seeing, even when wearing glasses/contacts?: No Does the patient have difficulty concentrating, remembering, or making decisions?: No Patient able to express need for assistance with ADLs?: Yes Does the patient have difficulty dressing or bathing?: No Independently performs ADLs?: Yes (appropriate for developmental age) Communication: Independent Dressing (OT): Independent Grooming: Independent Feeding: Independent Bathing: Independent Toileting: Independent In/Out Bed: Independent Walks in Home: Independent Does the patient have difficulty walking or climbing stairs?: No Weakness of Legs: Both Weakness of Arms/Hands: None  Permission Sought/Granted Permission sought to share information with : Family Supports Permission granted to share information with : Yes, Verbal Permission Granted  Share Information with NAME: Gary Wright     Permission granted to share info w Relationship: wife  Permission granted to share info w Contact Information: [phone]  Emotional Assessment Appearance:: Appears stated age Attitude/Demeanor/Rapport: Engaged Affect (typically observed): Accepting Orientation: : Oriented to Self,Oriented to   Time,Oriented to Place,Oriented to  Situation Alcohol / Substance Use: Not Applicable Psych Involvement: No (comment)  Admission diagnosis:  COPD exacerbation ([place]) [J44.1] Acute on chronic respiratory failure with hypoxia ([place]) [J96.21] Patient Active Problem List   Diagnosis Date Noted  . COPD exacerbation ([place]) 11/02/2020  . History of lung cancer 11/02/2020  . Prolonged QT interval 11/02/2020  . Hyperlipidemia 11/02/2020  . Acute respiratory failure with hypoxia ([place]) 11/02/2020  . Elevated troponin 06/13/2016  . Stress-induced cardiomyopathy 06/13/2016  . CAP (community acquired pneumonia) 06/13/2016  . Opioid dependence ([place]) 06/13/2016  . CAD (coronary artery disease) of artery bypass graft 06/13/2016  . Chest pain   . TOBACCO ABUSE 09/01/2007  . NARCOTIC ABUSE 09/01/2007  . Depression 09/01/2007  . Essential hypertension 09/01/2007  . TONSILLECTOMY, HX OF 09/01/2007   PCP:  Gary Alken, PA-C Pharmacy:   [facility] [place], [place] - [address] [place] [address] [place] [address] Phone: [phone] Fax: [phone]  [facility], [place] [address] AT [facility] [address] [place] [address] Phone: [phone] Fax: [phone]     Social Determinants of Health (SDOH) Interventions    Readmission Risk Interventions No flowsheet data found.

## 2021-01-15 NOTE — Progress Notes (Signed)
HPI: FUcoronary artery disease. Patientpreviously admitted with a non-ST elevation myocardial infarction. Cardiac catheterization 06/13/2016 showed mild diffuse nonobstructive coronary disease. Ejection fraction was 25-30% and felt consistent withtako tsubo CM. Echocardiogram 2 days later showed normal LV function and mild right ventricular enlargement. Abdominal ultrasound February 2021 showed no aneurysm. Since last seenhe has dyspnea on exertion that he attributes to his lung disease.  No orthopnea, PND or pedal edema.  Does have some chest pressure with inspiration.  Occasional sharp pain in his chest but no exertional chest pain.  No syncope.  Current Outpatient Medications  Medication Sig Dispense Refill  . albuterol (VENTOLIN HFA) 108 (90 Base) MCG/ACT inhaler Inhale 2 puffs into the lungs every 6 (six) hours as needed for wheezing or shortness of breath. 8 g 2  . aspirin 81 MG chewable tablet CHEW AND SWALLOW 1 TABLET(81 MG) BY MOUTH DAILY (Patient taking differently: Chew 81 mg by mouth daily.) 30 tablet 2  . atorvastatin (LIPITOR) 40 MG tablet TAKE 1 TABLET(40 MG) BY MOUTH DAILY AT 6 PM (Patient taking differently: Take 40 mg by mouth daily.) 90 tablet 2  . buPROPion (WELLBUTRIN XL) 300 MG 24 hr tablet Take 300 mg by mouth daily.  1  . citalopram (CELEXA) 40 MG tablet Take 40 mg by mouth daily.  0  . Fluticasone-Umeclidin-Vilant (TRELEGY ELLIPTA) 100-62.5-25 MCG/INH AEPB Inhale 1 puff into the lungs daily. 28 each 2  . lisinopril (ZESTRIL) 40 MG tablet TAKE 1 TABLET(40 MG) BY MOUTH DAILY (Patient taking differently: Take 40 mg by mouth every evening.) 90 tablet 3  . metoprolol succinate (TOPROL-XL) 25 MG 24 hr tablet TAKE 1/2 TABLET(12.5 MG) BY MOUTH DAILY (Patient taking differently: Take 12.5 mg by mouth daily at 12 noon.) 45 tablet 6   No current facility-administered medications for this visit.     Past Medical History:  Diagnosis Date  . COPD (chronic obstructive  pulmonary disease) ([place])   . Elevated troponin 06/13/2016  . Hypertension     Past Surgical History:  Procedure Laterality Date  . BACK SURGERY     fusion in 1973  . CARDIAC CATHETERIZATION N/A 06/13/2016   Procedure: Left Heart Cath and Coronary Angiography;  Surgeon: Nelva Bush, MD;  Location: [facility];  Service: Cardiovascular;  Laterality: N/A;  . INGUINAL HERNIA REPAIR Right 04/21/2018   Procedure: RIGHT INGUINAL HERNIA REPAIR ERAS PATHWAY;  Surgeon: Erroll Luna, MD;  Location: [facility];  Service: General;  Laterality: Right;  TAP BLOCK  . INSERTION OF MESH Right 04/21/2018   Procedure: INSERTION OF MESH;  Surgeon: Erroll Luna, MD;  Location: [facility];  Service: General;  Laterality: Right;  . TONSILLECTOMY      Social History   Socioeconomic History  . Marital status: Legally Separated    Spouse name: Not on file  . Number of children: Not on file  . Years of education: Not on file  . Highest education level: Not on file  Occupational History  . Not on file  Tobacco Use  . Smoking status: Current Every Day Smoker    Packs/day: 0.50    Types: Cigarettes  . Smokeless tobacco: Never Used  Vaping Use  . Vaping Use: Never used  Substance and Sexual Activity  . Alcohol use: No    Comment: No alcohol for 17 years  . Drug use: No  . Sexual activity: Not on file  Other Topics Concern  . Not on file  Social History Narrative  .  Not on file   Social Determinants of Health   Financial Resource Strain: Not on file  Food Insecurity: Not on file  Transportation Needs: Not on file  Physical Activity: Not on file  Stress: Not on file  Social Connections: Not on file  Intimate Partner Violence: Not on file    Family History  Problem Relation Age of Onset  . Hypertension Mother   . Hypertension Father     ROS: no fevers or chills, productive cough, hemoptysis, dysphasia, odynophagia, melena, hematochezia, dysuria, hematuria, rash, seizure activity,  orthopnea, PND, pedal edema, claudication. Remaining systems are negative.  Physical Exam: Well-developed well-nourished in no acute distress.  Skin is warm and dry.  HEENT is normal.  Neck is supple.  Chest with diminished breath sounds throughout and mild expiratory wheeze Cardiovascular exam is regular rate and rhythm.  Abdominal exam nontender or distended. No masses palpated. Extremities show no edema. neuro grossly intact  Electrocardiogram November 02, 2020 showed sinus tachycardia and right bundle branch block, personally reviewed.  A/P  1 coronary artery disease-patient doing well with no recurrent symptoms.  Continue medical therapy with aspirin and statin.  2 history of cardiomyopathy-echocardiogram most recently shows improvement in LV systolic function.  Continue ACE inhibitor and beta-blocker.  3 hypertension-blood pressure controlled.  Continue present medical regimen.  Check potassium and renal function.  4 hyperlipidemia-continue statin.  Check lipids and liver.  Kirk Ruths, MD

## 2021-01-18 ENCOUNTER — Ambulatory Visit (INDEPENDENT_AMBULATORY_CARE_PROVIDER_SITE_OTHER): Payer: Medicare Other | Admitting: Cardiology

## 2021-01-18 ENCOUNTER — Encounter: Payer: Self-pay | Admitting: Cardiology

## 2021-01-18 ENCOUNTER — Other Ambulatory Visit: Payer: Self-pay

## 2021-01-18 VITALS — BP 122/70 | HR 78 | Ht 70.0 in | Wt 196.6 lb

## 2021-01-18 DIAGNOSIS — E7800 Pure hypercholesterolemia, unspecified: Secondary | ICD-10-CM | POA: Diagnosis not present

## 2021-01-18 DIAGNOSIS — I5181 Takotsubo syndrome: Secondary | ICD-10-CM

## 2021-01-18 DIAGNOSIS — I2510 Atherosclerotic heart disease of native coronary artery without angina pectoris: Secondary | ICD-10-CM | POA: Diagnosis not present

## 2021-01-18 DIAGNOSIS — I10 Essential (primary) hypertension: Secondary | ICD-10-CM | POA: Diagnosis not present

## 2021-01-18 NOTE — Patient Instructions (Signed)

## 2021-01-19 ENCOUNTER — Encounter: Payer: Self-pay | Admitting: *Deleted

## 2021-01-19 LAB — LIPID PANEL
Chol/HDL Ratio: 2.3 ratio (ref 0.0–5.0)
Cholesterol, Total: 117 mg/dL (ref 100–199)
HDL: 50 mg/dL (ref >39)
LDL Chol Calc (NIH): 43 mg/dL (ref 0–99)
Triglycerides: 141 mg/dL (ref 0–149)
VLDL Cholesterol Cal: 24 mg/dL (ref 5–40)

## 2021-01-19 LAB — COMPREHENSIVE METABOLIC PANEL
ALT: 18 IU/L (ref 0–44)
AST: 17 IU/L (ref 0–40)
Albumin/Globulin Ratio: 2.6 — ABNORMAL HIGH (ref 1.2–2.2)
Albumin: 4.6 g/dL (ref 3.7–4.7)
Alkaline Phosphatase: 83 IU/L (ref 44–121)
BUN/Creatinine Ratio: 12 (ref 10–24)
BUN: 14 mg/dL (ref 8–27)
Bilirubin Total: 0.3 mg/dL (ref 0.0–1.2)
CO2: 21 mmol/L (ref 20–29)
Calcium: 9.2 mg/dL (ref 8.6–10.2)
Chloride: 102 mmol/L (ref 96–106)
Creatinine, Ser: 1.16 mg/dL (ref 0.76–1.27)
Globulin, Total: 1.8 g/dL (ref 1.5–4.5)
Glucose: 143 mg/dL — ABNORMAL HIGH (ref 65–99)
Potassium: 4.9 mmol/L (ref 3.5–5.2)
Sodium: 139 mmol/L (ref 134–144)
Total Protein: 6.4 g/dL (ref 6.0–8.5)
eGFR: 66 mL/min/{1.73_m2} (ref >59)

## 2021-02-07 ENCOUNTER — Other Ambulatory Visit: Payer: Self-pay

## 2021-02-07 MED ORDER — LISINOPRIL 40 MG PO TABS: ORAL_TABLET | ORAL | 3 refills | Status: DC

## 2021-05-08 ENCOUNTER — Other Ambulatory Visit: Payer: Self-pay | Admitting: Cardiology

## 2021-11-06 ENCOUNTER — Other Ambulatory Visit: Payer: Self-pay | Admitting: Cardiology

## 2022-02-04 ENCOUNTER — Other Ambulatory Visit: Payer: Self-pay | Admitting: Cardiology

## 2022-02-18 IMAGING — CR DG CHEST 2V
2 series · 2 of 2 positions shown · non-contrast
Comparison: Restaging chest CT 05/24/2019 and earlier.

CLINICAL DATA: 74-year-old male with shortness of breath for 3
days. History of lung cancer, prior lobectomy.

EXAM:
CHEST - 2 VIEW

[chest pa]
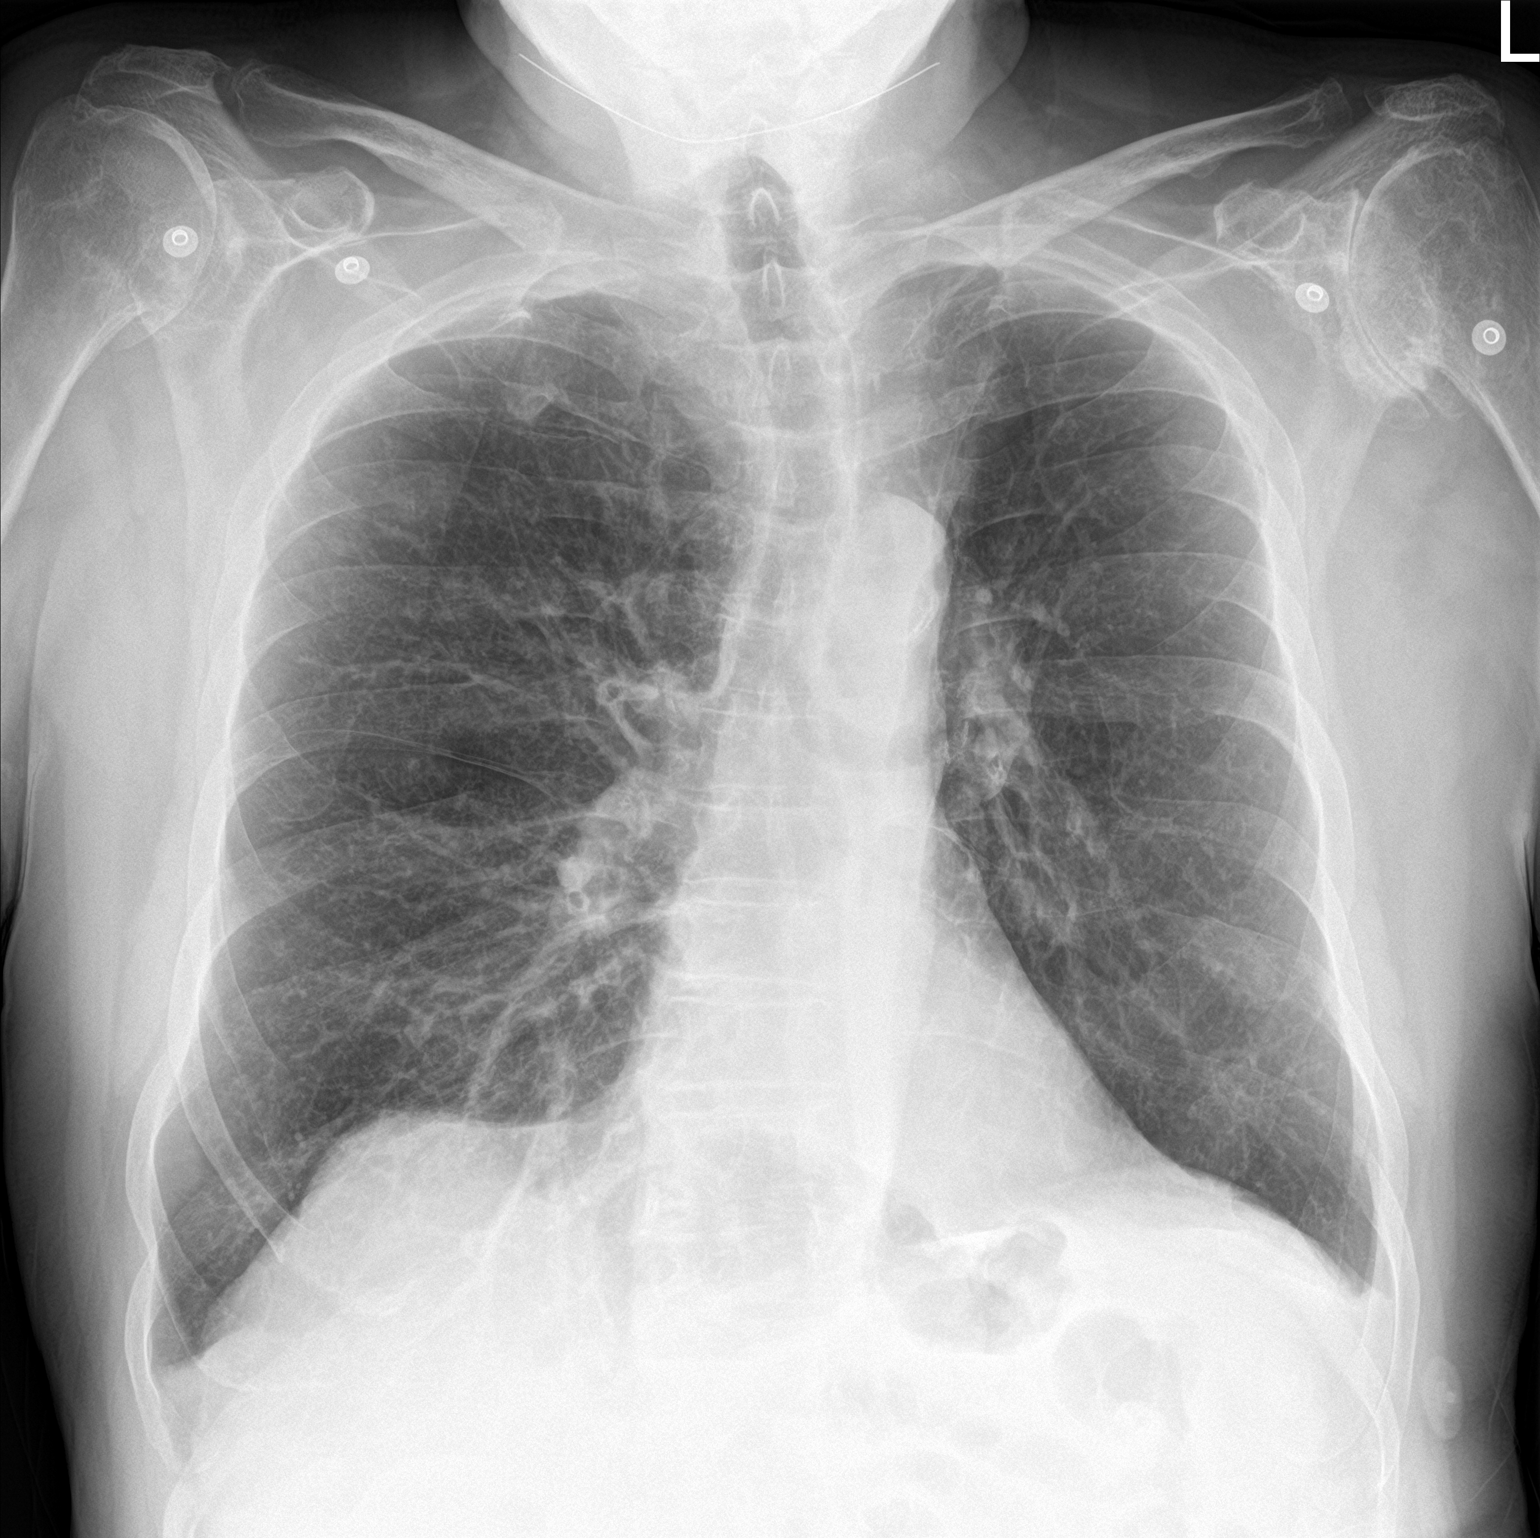

[chest lat]
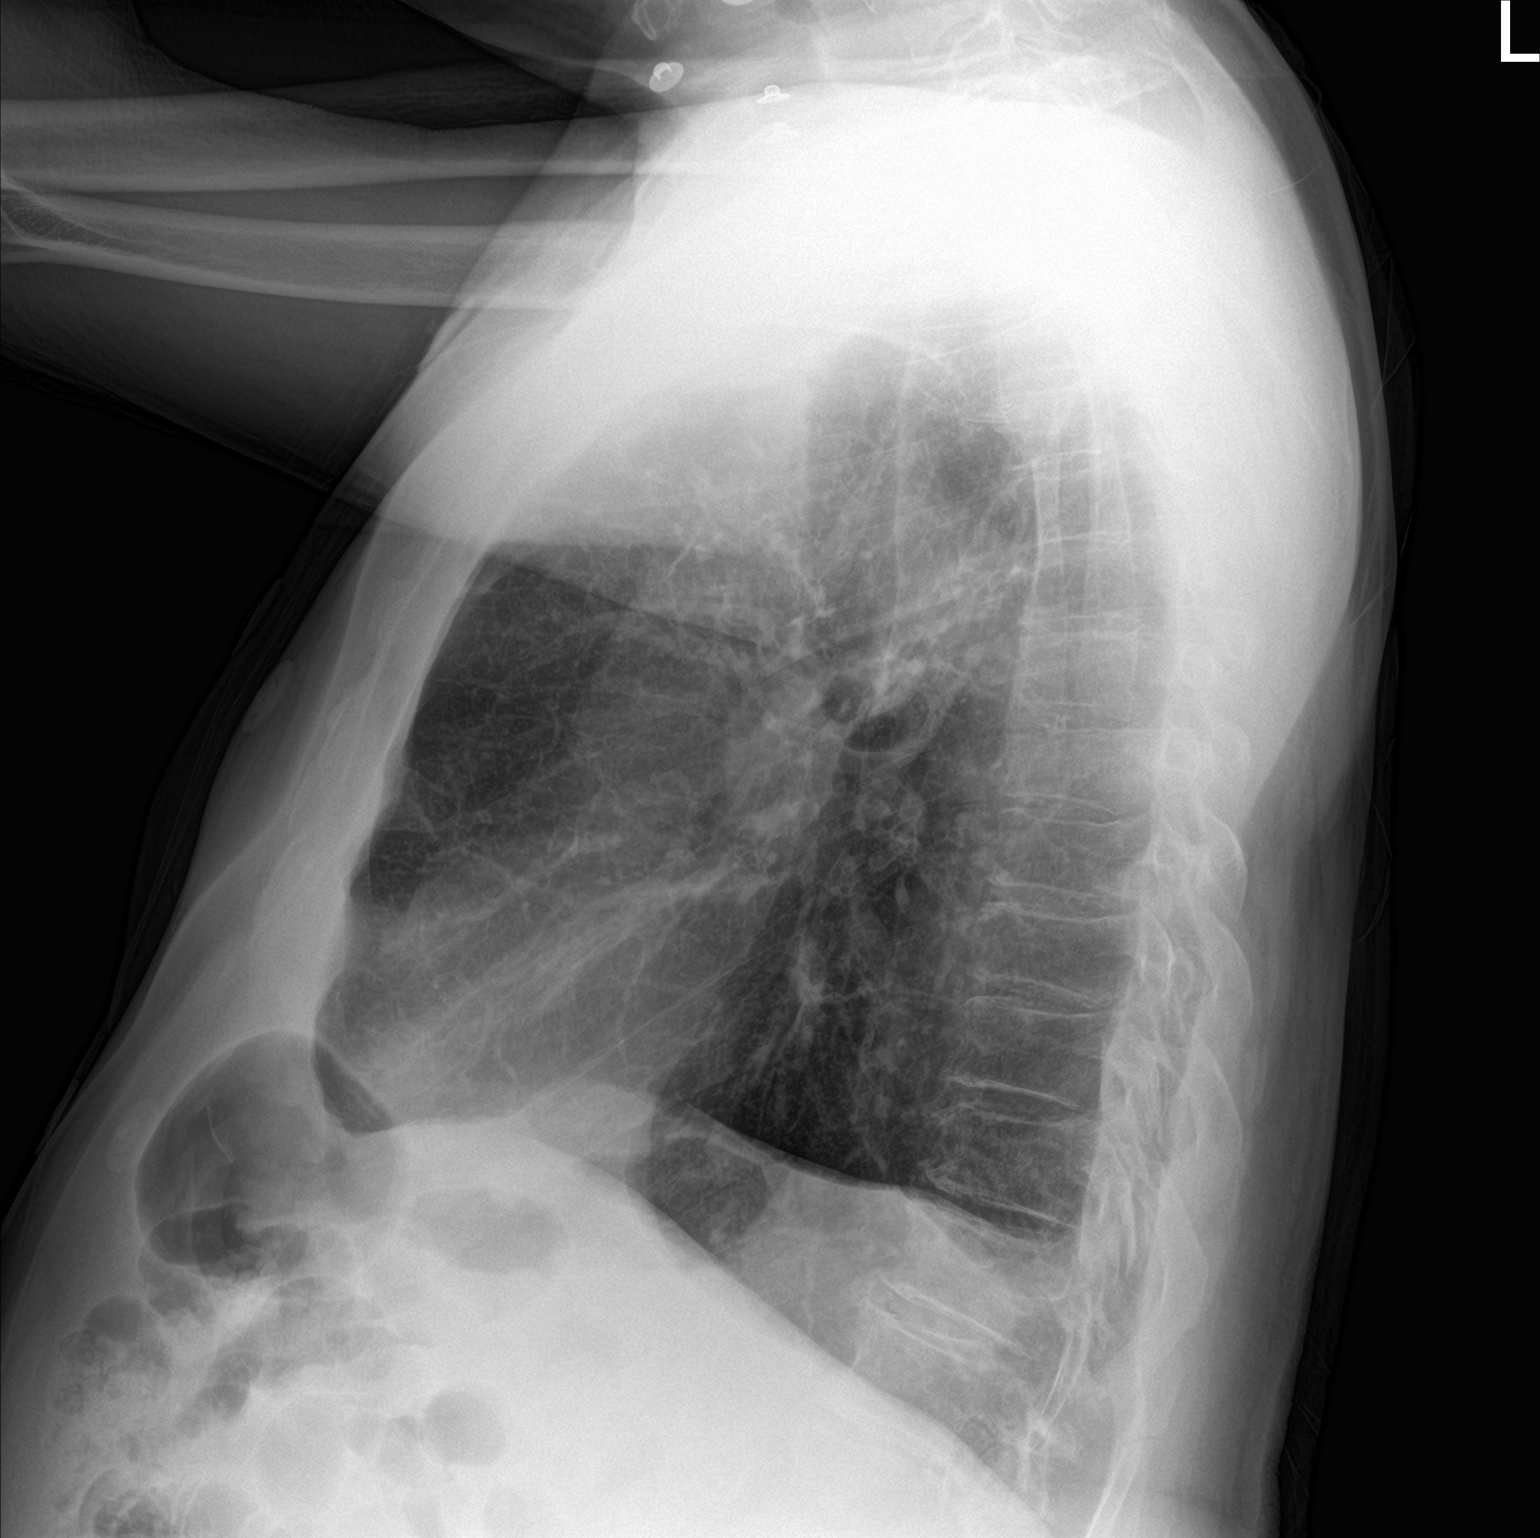

[2 of 2 positions shown; findings below may reference images not displayed]

FINDINGS: Chronic volume loss in the left lung from lobectomy but otherwise
underlying chronic pulmonary hyperinflation. Stable lung volumes
since 3838. Mediastinal contours remain within normal limits.
Visualized tracheal air column is within normal limits. No
pneumothorax. No pulmonary edema. No pleural effusion suspected. EKG
button artifact in both upper lungs. No acute pulmonary opacity.

Stable visualized osseous structures. Negative visible bowel gas
pattern.
IMPRESSION: Stable chronic lung disease and postoperative changes to the left
lung. No acute cardiopulmonary abnormality.

## 2022-03-24 ENCOUNTER — Inpatient Hospital Stay (HOSPITAL_COMMUNITY)
Admission: EM | Admit: 2022-03-24 | Discharge: 2022-03-27 | DRG: 189 | Disposition: A | Discharge status: Home / Self Care | Payer: Medicare Other | Attending: Internal Medicine | Admitting: Internal Medicine

## 2022-03-24 ENCOUNTER — Emergency Department (HOSPITAL_COMMUNITY): Payer: Medicare Other

## 2022-03-24 ENCOUNTER — Other Ambulatory Visit: Payer: Self-pay

## 2022-03-24 ENCOUNTER — Observation Stay (HOSPITAL_BASED_OUTPATIENT_CLINIC_OR_DEPARTMENT_OTHER): Payer: Medicare Other

## 2022-03-24 ENCOUNTER — Encounter (HOSPITAL_COMMUNITY): Payer: Self-pay

## 2022-03-24 DIAGNOSIS — F419 Anxiety disorder, unspecified: Secondary | ICD-10-CM | POA: Diagnosis present

## 2022-03-24 DIAGNOSIS — Z635 Disruption of family by separation and divorce: Secondary | ICD-10-CM

## 2022-03-24 DIAGNOSIS — I25810 Atherosclerosis of coronary artery bypass graft(s) without angina pectoris: Secondary | ICD-10-CM | POA: Diagnosis present

## 2022-03-24 DIAGNOSIS — I5022 Chronic systolic (congestive) heart failure: Secondary | ICD-10-CM | POA: Diagnosis present

## 2022-03-24 DIAGNOSIS — Z8249 Family history of ischemic heart disease and other diseases of the circulatory system: Secondary | ICD-10-CM

## 2022-03-24 DIAGNOSIS — J439 Emphysema, unspecified: Secondary | ICD-10-CM | POA: Diagnosis present

## 2022-03-24 DIAGNOSIS — N289 Disorder of kidney and ureter, unspecified: Secondary | ICD-10-CM | POA: Diagnosis present

## 2022-03-24 DIAGNOSIS — Z7951 Long term (current) use of inhaled steroids: Secondary | ICD-10-CM

## 2022-03-24 DIAGNOSIS — R0600 Dyspnea, unspecified: Secondary | ICD-10-CM

## 2022-03-24 DIAGNOSIS — I110 Hypertensive heart disease with heart failure: Secondary | ICD-10-CM | POA: Diagnosis present

## 2022-03-24 DIAGNOSIS — F32A Depression, unspecified: Secondary | ICD-10-CM | POA: Diagnosis present

## 2022-03-24 DIAGNOSIS — J441 Chronic obstructive pulmonary disease with (acute) exacerbation: Secondary | ICD-10-CM | POA: Diagnosis present

## 2022-03-24 DIAGNOSIS — E785 Hyperlipidemia, unspecified: Secondary | ICD-10-CM | POA: Diagnosis present

## 2022-03-24 DIAGNOSIS — R739 Hyperglycemia, unspecified: Secondary | ICD-10-CM | POA: Diagnosis present

## 2022-03-24 DIAGNOSIS — J9621 Acute and chronic respiratory failure with hypoxia: Principal | ICD-10-CM | POA: Diagnosis present

## 2022-03-24 DIAGNOSIS — R079 Chest pain, unspecified: Secondary | ICD-10-CM | POA: Diagnosis not present

## 2022-03-24 DIAGNOSIS — I252 Old myocardial infarction: Secondary | ICD-10-CM

## 2022-03-24 DIAGNOSIS — Z902 Acquired absence of lung [part of]: Secondary | ICD-10-CM

## 2022-03-24 DIAGNOSIS — Z20822 Contact with and (suspected) exposure to covid-19: Secondary | ICD-10-CM | POA: Diagnosis present

## 2022-03-24 DIAGNOSIS — R7989 Other specified abnormal findings of blood chemistry: Secondary | ICD-10-CM | POA: Diagnosis present

## 2022-03-24 DIAGNOSIS — D72829 Elevated white blood cell count, unspecified: Secondary | ICD-10-CM | POA: Diagnosis present

## 2022-03-24 DIAGNOSIS — Z7982 Long term (current) use of aspirin: Secondary | ICD-10-CM

## 2022-03-24 DIAGNOSIS — Z85118 Personal history of other malignant neoplasm of bronchus and lung: Secondary | ICD-10-CM

## 2022-03-24 DIAGNOSIS — I4510 Unspecified right bundle-branch block: Secondary | ICD-10-CM | POA: Diagnosis present

## 2022-03-24 DIAGNOSIS — I10 Essential (primary) hypertension: Secondary | ICD-10-CM | POA: Diagnosis present

## 2022-03-24 DIAGNOSIS — I2510 Atherosclerotic heart disease of native coronary artery without angina pectoris: Secondary | ICD-10-CM | POA: Diagnosis present

## 2022-03-24 DIAGNOSIS — R778 Other specified abnormalities of plasma proteins: Secondary | ICD-10-CM

## 2022-03-24 DIAGNOSIS — R0602 Shortness of breath: Secondary | ICD-10-CM | POA: Diagnosis not present

## 2022-03-24 DIAGNOSIS — Z6827 Body mass index (BMI) 27.0-27.9, adult: Secondary | ICD-10-CM

## 2022-03-24 DIAGNOSIS — Z79899 Other long term (current) drug therapy: Secondary | ICD-10-CM

## 2022-03-24 DIAGNOSIS — R7303 Prediabetes: Secondary | ICD-10-CM | POA: Diagnosis present

## 2022-03-24 DIAGNOSIS — Z87891 Personal history of nicotine dependence: Secondary | ICD-10-CM

## 2022-03-24 DIAGNOSIS — I248 Other forms of acute ischemic heart disease: Secondary | ICD-10-CM | POA: Diagnosis present

## 2022-03-24 DIAGNOSIS — T380X5A Adverse effect of glucocorticoids and synthetic analogues, initial encounter: Secondary | ICD-10-CM | POA: Diagnosis present

## 2022-03-24 DIAGNOSIS — E663 Overweight: Secondary | ICD-10-CM | POA: Diagnosis present

## 2022-03-24 HISTORY — DX: Hypocalcemia: E83.51

## 2022-03-24 HISTORY — DX: Dyspnea, unspecified: R06.00

## 2022-03-24 LAB — ECHOCARDIOGRAM COMPLETE
AR max vel: 2.73 cm2
AV Peak grad: 4 mmHg
Ao pk vel: 1 m/s
Area-P 1/2: 6.07 cm2
Calc EF: 47.3 %
Height: 69 in
S' Lateral: 3 cm
Single Plane A2C EF: 46.1 %
Single Plane A4C EF: 47.8 %
Weight: 2976 oz

## 2022-03-24 LAB — CBC WITH DIFFERENTIAL/PLATELET
Abs Immature Granulocytes: 0.06 10*3/uL (ref 0.00–0.07)
Basophils Absolute: 0 10*3/uL (ref 0.0–0.1)
Basophils Relative: 0 %
Eosinophils Absolute: 0.3 10*3/uL (ref 0.0–0.5)
Eosinophils Relative: 2 %
HCT: 41.2 % (ref 39.0–52.0)
Hemoglobin: 13.5 g/dL (ref 13.0–17.0)
Immature Granulocytes: 0 %
Lymphocytes Relative: 5 %
Lymphs Abs: 0.8 10*3/uL (ref 0.7–4.0)
MCH: 31.4 pg (ref 26.0–34.0)
MCHC: 32.8 g/dL (ref 30.0–36.0)
MCV: 95.8 fL (ref 80.0–100.0)
Monocytes Absolute: 1.1 10*3/uL — ABNORMAL HIGH (ref 0.1–1.0)
Monocytes Relative: 7 %
Neutro Abs: 13.4 10*3/uL — ABNORMAL HIGH (ref 1.7–7.7)
Neutrophils Relative %: 86 %
Platelets: 260 10*3/uL (ref 150–400)
RBC: 4.3 MIL/uL (ref 4.22–5.81)
RDW: 13 % (ref 11.5–15.5)
WBC: 15.6 10*3/uL — ABNORMAL HIGH (ref 4.0–10.5)
nRBC: 0 % (ref 0.0–0.2)

## 2022-03-24 LAB — COMPREHENSIVE METABOLIC PANEL
ALT: 19 U/L (ref 0–44)
AST: 20 U/L (ref 15–41)
Albumin: 4 g/dL (ref 3.5–5.0)
Alkaline Phosphatase: 64 U/L (ref 38–126)
Anion gap: 7 (ref 5–15)
BUN: 17 mg/dL (ref 8–23)
CO2: 26 mmol/L (ref 22–32)
Calcium: 8.7 mg/dL — ABNORMAL LOW (ref 8.9–10.3)
Chloride: 107 mmol/L (ref 98–111)
Creatinine, Ser: 1.24 mg/dL (ref 0.61–1.24)
GFR, Estimated: >60 mL/min (ref >60)
Glucose, Bld: 131 mg/dL — ABNORMAL HIGH (ref 70–99)
Potassium: 4.1 mmol/L (ref 3.5–5.1)
Sodium: 140 mmol/L (ref 135–145)
Total Bilirubin: 0.6 mg/dL (ref 0.3–1.2)
Total Protein: 6.7 g/dL (ref 6.5–8.1)

## 2022-03-24 LAB — TROPONIN I (HIGH SENSITIVITY)
Troponin I (High Sensitivity): 261 ng/L (ref <18)
Troponin I (High Sensitivity): 240 ng/L (ref <18)

## 2022-03-24 LAB — PHOSPHORUS: Phosphorus: 2.4 mg/dL — ABNORMAL LOW (ref 2.5–4.6)

## 2022-03-24 LAB — SARS CORONAVIRUS 2 BY RT PCR: SARS Coronavirus 2 by RT PCR: NEGATIVE

## 2022-03-24 LAB — BRAIN NATRIURETIC PEPTIDE: B Natriuretic Peptide: 80.6 pg/mL (ref 0.0–100.0)

## 2022-03-24 MED ORDER — PROCHLORPERAZINE EDISYLATE 10 MG/2ML IJ SOLN: 5.0000 mg | INTRAMUSCULAR | Status: DC | PRN

## 2022-03-24 MED ORDER — ALBUTEROL SULFATE (2.5 MG/3ML) 0.083% IN NEBU
2.5000 mg | INHALATION_SOLUTION | RESPIRATORY_TRACT | Status: DC | PRN
  Administered 2022-03-24 – 2022-03-25 (×4): 2.5 mg via RESPIRATORY_TRACT
  Filled 2022-03-24 (×4): qty 3

## 2022-03-24 MED ORDER — ORAL CARE MOUTH RINSE
15.0000 mL | Freq: Two times a day (BID) | OROMUCOSAL | Status: DC
  Administered 2022-03-25 – 2022-03-26 (×3): 15 mL via OROMUCOSAL

## 2022-03-24 MED ORDER — METOPROLOL SUCCINATE ER 25 MG PO TB24
12.5000 mg | ORAL_TABLET | Freq: Every day | ORAL | Status: DC
  Administered 2022-03-24 – 2022-03-26 (×3): 12.5 mg via ORAL
  Filled 2022-03-24: qty 1
  Filled 2022-03-24: qty 0.5
  Filled 2022-03-24: qty 1

## 2022-03-24 MED ORDER — IPRATROPIUM-ALBUTEROL 0.5-2.5 (3) MG/3ML IN SOLN
3.0000 mL | Freq: Once | RESPIRATORY_TRACT | Status: AC
  Administered 2022-03-24: 3 mL via RESPIRATORY_TRACT
  Filled 2022-03-24: qty 3

## 2022-03-24 MED ORDER — ENOXAPARIN SODIUM 40 MG/0.4ML IJ SOSY
40.0000 mg | PREFILLED_SYRINGE | INTRAMUSCULAR | Status: DC
  Administered 2022-03-24 – 2022-03-26 (×3): 40 mg via SUBCUTANEOUS
  Filled 2022-03-24 (×3): qty 0.4

## 2022-03-24 MED ORDER — MAGNESIUM SULFATE 2 GM/50ML IV SOLN
2.0000 g | Freq: Once | INTRAVENOUS | Status: AC
  Administered 2022-03-24: 2 g via INTRAVENOUS
  Filled 2022-03-24: qty 50

## 2022-03-24 MED ORDER — ENSURE ENLIVE PO LIQD
237.0000 mL | Freq: Two times a day (BID) | ORAL | Status: DC
Start: 2022-03-25 — End: 2022-03-27
  Administered 2022-03-25 – 2022-03-26 (×4): 237 mL via ORAL

## 2022-03-24 MED ORDER — ASPIRIN 81 MG PO TBEC
81.0000 mg | DELAYED_RELEASE_TABLET | Freq: Every day | ORAL | Status: DC
  Administered 2022-03-25 – 2022-03-27 (×3): 81 mg via ORAL
  Filled 2022-03-24 (×3): qty 1

## 2022-03-24 MED ORDER — ATORVASTATIN CALCIUM 40 MG PO TABS
40.0000 mg | ORAL_TABLET | Freq: Every day | ORAL | Status: DC
  Administered 2022-03-24 – 2022-03-26 (×3): 40 mg via ORAL
  Filled 2022-03-24 (×3): qty 1

## 2022-03-24 MED ORDER — PREDNISONE 20 MG PO TABS
40.0000 mg | ORAL_TABLET | Freq: Every day | ORAL | Status: DC
  Administered 2022-03-25: 40 mg via ORAL
  Filled 2022-03-24: qty 2

## 2022-03-24 MED ORDER — ACETAMINOPHEN 325 MG PO TABS: 650.0000 mg | ORAL_TABLET | Freq: Four times a day (QID) | ORAL | Status: DC | PRN

## 2022-03-24 MED ORDER — BUPROPION HCL ER (XL) 300 MG PO TB24
300.0000 mg | ORAL_TABLET | Freq: Every morning | ORAL | Status: DC
  Administered 2022-03-25 – 2022-03-27 (×3): 300 mg via ORAL
  Filled 2022-03-24 (×3): qty 1

## 2022-03-24 MED ORDER — PERFLUTREN LIPID MICROSPHERE
1.0000 mL | INTRAVENOUS | Status: AC | PRN
  Administered 2022-03-24: 2 mL via INTRAVENOUS

## 2022-03-24 MED ORDER — LISINOPRIL 20 MG PO TABS
40.0000 mg | ORAL_TABLET | Freq: Every day | ORAL | Status: DC
  Administered 2022-03-24 – 2022-03-26 (×3): 40 mg via ORAL
  Filled 2022-03-24 (×2): qty 2
  Filled 2022-03-24: qty 4

## 2022-03-24 MED ORDER — ACETAMINOPHEN 650 MG RE SUPP: 650.0000 mg | Freq: Four times a day (QID) | RECTAL | Status: DC | PRN

## 2022-03-24 MED ORDER — CITALOPRAM HYDROBROMIDE 20 MG PO TABS
40.0000 mg | ORAL_TABLET | Freq: Every morning | ORAL | Status: DC
  Administered 2022-03-25 – 2022-03-27 (×3): 40 mg via ORAL
  Filled 2022-03-24 (×3): qty 2

## 2022-03-24 MED ORDER — METHYLPREDNISOLONE SODIUM SUCC 125 MG IJ SOLR
125.0000 mg | Freq: Once | INTRAMUSCULAR | Status: AC
Start: 2022-03-24 — End: 2022-03-24
  Administered 2022-03-24: 125 mg via INTRAVENOUS
  Filled 2022-03-24: qty 2

## 2022-03-24 NOTE — ED Provider Notes (Signed)
[facility] DEPT Provider Note   CSN: [id number] Arrival date & time: 03/24/22  1101     History  Chief Complaint  Patient presents with   Respiratory Distress    Gary Wright is a 76 y.o. male.  Patient here with shortness of breath, cough, chest pain.  History of COPD, hypertension.  Patient states symptoms started yesterday.  Felt chest tightness, cough difficulty breathing.  Breathing treatments at home are not helping much.  EMS gave DuoNeb's.  Still some difficulty breathing.  Denies any fever, chills, weakness, numbness, abdominal pain.  Walking makes it worse.  The history is provided by the patient.      Home Medications Prior to Admission medications   Medication Sig Start Date End Date Taking? Authorizing Provider  albuterol (VENTOLIN HFA) 108 (90 Base) MCG/ACT inhaler Inhale 2 puffs into the lungs every 6 (six) hours as needed for wheezing or shortness of breath. 11/04/20   Barb Merino, MD  aspirin 81 MG chewable tablet CHEW AND SWALLOW 1 TABLET(81 MG) BY MOUTH DAILY Patient taking differently: Chew 81 mg by mouth daily. 07/22/16   Lelon Perla, MD  atorvastatin (LIPITOR) 40 MG tablet TAKE 1 TABLET(40 MG) BY MOUTH DAILY AT 6 PM 05/08/21   Lelon Perla, MD  buPROPion (WELLBUTRIN XL) 300 MG 24 hr tablet Take 300 mg by mouth daily. 05/11/16   [provider]  citalopram (CELEXA) 40 MG tablet Take 40 mg by mouth daily. 06/01/16   [provider]  Fluticasone-Umeclidin-Vilant (TRELEGY ELLIPTA) 100-62.5-25 MCG/INH AEPB Inhale 1 puff into the lungs daily. 11/04/20   Barb Merino, MD  lisinopril (ZESTRIL) 40 MG tablet TAKE 1 TABLET(40 MG) BY MOUTH DAILY. Schedule an appointment for further refils, 1st attempt 02/05/22   Lelon Perla, MD  metoprolol succinate (TOPROL-XL) 25 MG 24 hr tablet TAKE 1/2 TABLET(12.5 MG) BY MOUTH DAILY 11/06/21   Lelon Perla, MD      Allergies    Patient has no known allergies.     Review of Systems   Review of Systems  Physical Exam Updated Vital Signs BP 115/85   Pulse (!) 106   Temp (!) 97.1 F (36.2 C)   Resp 17   Ht 5\' 9"  (1.753 m)   Wt 84.4 kg   SpO2 95%   BMI 27.47 kg/m  Physical Exam Vitals and nursing note reviewed.  Constitutional:      General: He is in acute distress.     Appearance: He is well-developed.  HENT:     Head: Normocephalic and atraumatic.     Nose: Nose normal.     Mouth/Throat:     Mouth: Mucous membranes are moist.  Eyes:     Extraocular Movements: Extraocular movements intact.     Conjunctiva/sclera: Conjunctivae normal.     Pupils: Pupils are equal, round, and reactive to light.  Cardiovascular:     Rate and Rhythm: Regular rhythm. Tachycardia present.     Heart sounds: No murmur heard. Pulmonary:     Effort: No respiratory distress.     Breath sounds: Wheezing present.     Comments: Increased work of breathing, poor air movement, wheezing throughout Abdominal:     Palpations: Abdomen is soft.     Tenderness: There is no abdominal tenderness.  Musculoskeletal:        General: No swelling.     Cervical back: Neck supple.     Right lower leg: No edema.     Left  lower leg: No edema.  Skin:    General: Skin is warm and dry.     Capillary Refill: Capillary refill takes less than 2 seconds.  Neurological:     General: No focal deficit present.     Mental Status: He is alert.  Psychiatric:        Mood and Affect: Mood normal.    ED Results / Procedures / Treatments   Labs (all labs ordered are listed, but only abnormal results are displayed) Labs Reviewed  CBC WITH DIFFERENTIAL/PLATELET - Abnormal; Notable for the following components:      Result Value   WBC 15.6 (*)    Neutro Abs 13.4 (*)    Monocytes Absolute 1.1 (*)    All other components within normal limits  COMPREHENSIVE METABOLIC PANEL - Abnormal; Notable for the following components:   Glucose, Bld 131 (*)    Calcium 8.7 (*)    All other  components within normal limits  TROPONIN I (HIGH SENSITIVITY) - Abnormal; Notable for the following components:   Troponin I (High Sensitivity) 261 (*)    All other components within normal limits  SARS CORONAVIRUS 2 BY RT PCR  BRAIN NATRIURETIC PEPTIDE  TROPONIN I (HIGH SENSITIVITY)    EKG EKG Interpretation  Date/Time:  Sunday March 24 2022 12:07:14 EDT Ventricular Rate:  105 PR Interval:  165 QRS Duration: 138 QT Interval:  381 QTC Calculation: 504 R Axis:   -31 Text Interpretation: Sinus tachycardia Right bundle branch block Baseline wander in lead(s) V2 V5 V6 Confirmed by Lennice Sites ([id number]) on 03/24/2022 12:10:50 PM  Radiology DG Chest Portable 1 View  Result Date: 03/24/2022 CLINICAL DATA:  Acute shortness of breath EXAM: PORTABLE CHEST 1 VIEW COMPARISON:  11/02/2020 and prior studies FINDINGS: The cardiomediastinal silhouette is unremarkable. LEFT lobectomy changes again noted. Emphysema again noted. There is no evidence of focal airspace disease, pulmonary edema, suspicious pulmonary nodule/mass, pleural effusion, or pneumothorax. No acute bony abnormalities are identified. IMPRESSION: Emphysema without evidence of acute cardiopulmonary disease. Electronically Signed   By: Margarette Canada M.D.   On: 03/24/2022 11:34    Procedures Procedures    Medications Ordered in ED Medications  ipratropium-albuterol (DUONEB) 0.5-2.5 (3) MG/3ML nebulizer solution 3 mL (3 mLs Nebulization Given 03/24/22 1131)  magnesium sulfate IVPB 2 g 50 mL (2 g Intravenous New Bag/Given 03/24/22 1133)  methylPREDNISolone sodium succinate (SOLU-MEDROL) 125 mg/2 mL injection 125 mg (125 mg Intravenous Given 03/24/22 1131)    ED Course/ Medical Decision Making/ A&P                           Medical Decision Making Amount and/or Complexity of Data Reviewed Labs: ordered. Radiology: ordered.  Risk Prescription drug management.   [name] is here with shortness of breath, cough, chest pain.  Patient  little bit tachycardic but otherwise normal vitals.  EKG shows sinus rhythm.  No ischemic changes.  History of COPD, hypertension.  Per chart review he did have cardiac cath about 5 or 6 years ago that showed mild diffuse nonobstructive CAD.  Did have an EF of 20 to 25%.  He has been using his nebulizer at home without much relief.  Denies any fevers or chills.  Has had a dry cough.  He has increased work of breathing, wheezing throughout.  Differential diagnosis is likely COPD exacerbation, less likely ACS.  He has no PE risk factors.  Could be pneumonia or COVID infection.  We will get CBC, CMP, troponin, BNP, chest x-ray, COVID testing.  Will give DuoNebs, IV steroids, IV magnesium and reevaluate.  We will get BNP.  Per my review and interpretation of labs there is no significant anemia or electrolyte abnormality.  Troponin is elevated to 260.  EKG however per my review and interpretation shows sinus rhythm.  No ischemic changes.  He does have a history of Takotsubo.  However BNP is normal.  No signs of volume overload or pneumonia on chest x-ray per my review and interpretation.  My suspicion that this is a COPD exacerbation given his clinical presentation.  Seems less likely to be ACS.  Does not have any PE risk factors.  I have talked with Dr. Harl Bowie for cardiology who recommends continuing to trend troponin, get echocardiogram and to reach back out if troponin continues to spike.  We will continue to treat for COPD exacerbation.  Medicine team to admit.  This chart was dictated using voice recognition software.  Despite best efforts to proofread,  errors can occur which can change the documentation meaning.         Final Clinical Impression(s) / ED Diagnoses Final diagnoses:  COPD exacerbation ([place])  Elevated troponin    Rx / DC Orders ED Discharge Orders     None         Lennice Sites, DO 03/24/22 1253

## 2022-03-24 NOTE — ED Triage Notes (Signed)
Pt coming from home with c/o sudden onset shortness of breath. Hx bronchitis, emphysema, and lung cancer. Pt took home neb and inhaler with no relief. EMS gave two duoneb tx with some relief. Wheezing heard in all present lung quadrants.

## 2022-03-24 NOTE — H&P (Signed)
History and Physical    Patient: Gary Wright IWP:[id number] DOB: Jan 04, 1946 DOA: 03/24/2022 DOS: the patient was seen and examined on 03/24/2022 PCP: Gaye Alken, PA-C  Patient coming from: Home  Chief Complaint:  Chief Complaint  Patient presents with   Respiratory Distress   HPI: Gary Wright is a 76 y.o. male with medical history significant of CAD, COPD, depression, elevated troponin, history of lung cancer, hyperlipidemia, hypertension, prolonged QT interval, stress-induced cardiomyopathy who is coming to the emergency department due to sudden onset of shortness of breath associated with wheezing, chest pressure-like sensation, palpitations with diaphoresis while he was sitting working on a puzzle that did not get relief with a nebulizer treatment at home.  EMS was called.  They gave 2 DuoNebs in route. He has frequent rhinorrhea, but denied fever, chills, sore throat or hemoptysis.  He gets occasional orthopnea, but no PND or pitting edema of the lower extremities.  No abdominal pain, nausea, emesis, diarrhea, constipation, melena or hematochezia.  No flank pain, dysuria, frequency or hematuria.  No polyuria, polydipsia, polyphagia or blurred vision.   ED course: Initial vital signs were temperature 97.1 F, pulse 113, respiration 18, BP 115/71 mmHg O2 sat 99% while on aerosol mask at 5 L/min.  He received another DuoNeb, methylprednisolone 125 mg and magnesium sulfate 2 g IVPB.  Lab work: CBC 0 white count of 15.6, hemoglobin 13.5 g/dL platelets 260.  First troponin 261 ng/L.  Second troponin still pending.  BNP normal at 80.6 pg/mL.  CMP showed a glucose of 137 and calcium of 8.7 mg/dL.  The rest of the CMP measurements were within expected range.  Coronavirus PCR was negative.  Imaging: Portable 1 view chest radiograph shows emphysema without evidence of acute cardiopulmonary disease.  Review of Systems: As mentioned in the history of present illness. All other systems reviewed  and are negative.  Past Medical History:  Diagnosis Date   CAD (coronary artery disease) of artery bypass graft 06/13/2016   COPD (chronic obstructive pulmonary disease) ([place])    Depression 09/01/2007   Qualifier: Diagnosis of  By: Danny Lawless CMA, [place]     Elevated troponin 06/13/2016   History of lung cancer 11/02/2020   Hyperlipidemia 11/02/2020   Hypertension    Prolonged QT interval 11/02/2020   Stress-induced cardiomyopathy 06/13/2016   Past Surgical History:  Procedure Laterality Date   BACK SURGERY     fusion in [place] N/A 06/13/2016   Procedure: Left Heart Cath and Coronary Angiography;  Surgeon: Nelva Bush, MD;  Location: [facility];  Service: Cardiovascular;  Laterality: N/A;   INGUINAL HERNIA REPAIR Right 04/21/2018   Procedure: RIGHT INGUINAL HERNIA REPAIR ERAS PATHWAY;  Surgeon: Erroll Luna, MD;  Location: [facility];  Service: General;  Laterality: Right;  TAP BLOCK   INSERTION OF MESH Right 04/21/2018   Procedure: INSERTION OF MESH;  Surgeon: Erroll Luna, MD;  Location: [facility];  Service: General;  Laterality: Right;   TONSILLECTOMY     Social History:  reports that he quit smoking about 4 years ago. His smoking use included cigarettes. He smoked an average of .5 packs per day. He has never used smokeless tobacco. He reports that he does not drink alcohol and does not use drugs.  No Known Allergies  Family History  Problem Relation Age of Onset   Hypertension Mother    Hypertension Father     Prior to Admission medications   Medication Sig Start Date End Date Taking? Authorizing  Provider  albuterol (VENTOLIN HFA) 108 (90 Base) MCG/ACT inhaler Inhale 2 puffs into the lungs every 6 (six) hours as needed for wheezing or shortness of breath. 11/04/20   Barb Merino, MD  aspirin 81 MG chewable tablet CHEW AND SWALLOW 1 TABLET(81 MG) BY MOUTH DAILY Patient taking differently: Chew 81 mg by mouth daily. 07/22/16   Lelon Perla, MD   atorvastatin (LIPITOR) 40 MG tablet TAKE 1 TABLET(40 MG) BY MOUTH DAILY AT 6 PM 05/08/21   Lelon Perla, MD  buPROPion (WELLBUTRIN XL) 300 MG 24 hr tablet Take 300 mg by mouth daily. 05/11/16   [provider]  citalopram (CELEXA) 40 MG tablet Take 40 mg by mouth daily. 06/01/16   [provider]  Fluticasone-Umeclidin-Vilant (TRELEGY ELLIPTA) 100-62.5-25 MCG/INH AEPB Inhale 1 puff into the lungs daily. 11/04/20   Barb Merino, MD  lisinopril (ZESTRIL) 40 MG tablet TAKE 1 TABLET(40 MG) BY MOUTH DAILY. Schedule an appointment for further refils, 1st attempt 02/05/22   Lelon Perla, MD  metoprolol succinate (TOPROL-XL) 25 MG 24 hr tablet TAKE 1/2 TABLET(12.5 MG) BY MOUTH DAILY 11/06/21   Lelon Perla, MD    Physical Exam: Vitals:   03/24/22 1118 03/24/22 1145 03/24/22 1215 03/24/22 1245  BP:  113/76 105/72 115/85  Pulse:  (!) 110 (!) 103 (!) 106  Resp:  13 20 17   Temp:      SpO2:  97% 95% 95%  Weight: 84.4 kg     Height: 5\' 9"  (1.753 m)      Physical Exam Vitals and nursing note reviewed.  Constitutional:      General: He is awake. He is not in acute distress.    Appearance: He is overweight. He is not ill-appearing.     Interventions: Nasal cannula in place.  HENT:     Head: Normocephalic.     Mouth/Throat:     Mouth: Mucous membranes are moist.     Pharynx: Oropharynx is clear. No posterior oropharyngeal erythema.  Eyes:     General: No scleral icterus.    Pupils: Pupils are equal, round, and reactive to light.  Neck:     Vascular: No JVD.  Cardiovascular:     Rate and Rhythm: Normal rate and regular rhythm.     Heart sounds: S1 normal and S2 normal.  Pulmonary:     Effort: Pulmonary effort is normal.     Breath sounds: Decreased air movement present. Wheezing present. No rhonchi or rales.  Abdominal:     General: Bowel sounds are normal. There is no distension.     Palpations: Abdomen is soft.     Tenderness: There is no abdominal  tenderness.  Musculoskeletal:     Cervical back: Neck supple.     Right lower leg: No edema.     Left lower leg: No edema.  Skin:    General: Skin is warm and dry.  Neurological:     General: No focal deficit present.     Mental Status: He is alert and oriented to person, place, and time.  Psychiatric:        Mood and Affect: Mood normal.        Behavior: Behavior normal. Behavior is cooperative.   Data Reviewed:  There are no new results to review at this time.  Assessment and Plan: Principal Problem:   Acute dyspnea In the setting of:   COPD exacerbation (HCC) Observation/PCU. Continue supplemental oxygen. Received methylprednisolone 125 mg IVP x1. Followed by  prednisone 40 mg p.o. daily in a.m. Scheduled and as needed bronchodilators. Follow-up CBC and chemistry in the morning.   Active Problems:   Depression Continue Wellbutrin 300 mg p.o. daily. Continue escitalopram 40 mg p.o. daily. Follow-up with PCP and [facility] as an outpatient.    Essential hypertension Continue lisinopril 40 mg p.o. daily. Continue metoprolol 12.5 mg p.o. daily.    Elevated troponin In the setting of:   Chest pain With history of:   CAD (coronary artery disease) of artery bypass graft Cardiology discussed with Dr. Ronnald Nian. They believe this is demand ischemia. Continue aspirin, statin, ACE and beta-blocker. Trend troponin level and obtain an echocardiogram.    Hyperlipidemia Continue atorvastatin 40 mg p.o. daily.    Hypocalcemia Recheck calcium level in the morning.    Hyperglycemia Nonfasting level. Follow-up fasting glucose in the morning.    Advance Care Planning:   Code Status: Full Code   Consults:   Family Communication:   Severity of Illness: The appropriate patient status for this patient is OBSERVATION. Observation status is judged to be reasonable and necessary in order to provide the required intensity of service to ensure the patient's safety. The patient's  presenting symptoms, physical exam findings, and initial radiographic and laboratory data in the context of their medical condition is felt to place them at decreased risk for further clinical deterioration. Furthermore, it is anticipated that the patient will be medically stable for discharge from the hospital within 2 midnights of admission.   Author: Reubin Milan, MD 03/24/2022 1:04 PM  For on call review [email or web address].   This document was prepared using Dragon voice recognition software and may contain some unintended transcription errors.

## 2022-03-25 ENCOUNTER — Encounter (HOSPITAL_COMMUNITY): Payer: Self-pay | Admitting: Cardiology

## 2022-03-25 DIAGNOSIS — I5022 Chronic systolic (congestive) heart failure: Secondary | ICD-10-CM | POA: Diagnosis present

## 2022-03-25 DIAGNOSIS — I10 Essential (primary) hypertension: Secondary | ICD-10-CM

## 2022-03-25 DIAGNOSIS — R0600 Dyspnea, unspecified: Secondary | ICD-10-CM | POA: Diagnosis not present

## 2022-03-25 DIAGNOSIS — Z635 Disruption of family by separation and divorce: Secondary | ICD-10-CM | POA: Diagnosis not present

## 2022-03-25 DIAGNOSIS — R0602 Shortness of breath: Secondary | ICD-10-CM | POA: Diagnosis present

## 2022-03-25 DIAGNOSIS — D72829 Elevated white blood cell count, unspecified: Secondary | ICD-10-CM | POA: Diagnosis present

## 2022-03-25 DIAGNOSIS — N289 Disorder of kidney and ureter, unspecified: Secondary | ICD-10-CM | POA: Diagnosis present

## 2022-03-25 DIAGNOSIS — Z902 Acquired absence of lung [part of]: Secondary | ICD-10-CM | POA: Diagnosis not present

## 2022-03-25 DIAGNOSIS — E785 Hyperlipidemia, unspecified: Secondary | ICD-10-CM

## 2022-03-25 DIAGNOSIS — T380X5A Adverse effect of glucocorticoids and synthetic analogues, initial encounter: Secondary | ICD-10-CM | POA: Diagnosis present

## 2022-03-25 DIAGNOSIS — J9621 Acute and chronic respiratory failure with hypoxia: Secondary | ICD-10-CM | POA: Diagnosis present

## 2022-03-25 DIAGNOSIS — R079 Chest pain, unspecified: Secondary | ICD-10-CM

## 2022-03-25 DIAGNOSIS — I248 Other forms of acute ischemic heart disease: Secondary | ICD-10-CM | POA: Diagnosis present

## 2022-03-25 DIAGNOSIS — F419 Anxiety disorder, unspecified: Secondary | ICD-10-CM | POA: Diagnosis present

## 2022-03-25 DIAGNOSIS — Z85118 Personal history of other malignant neoplasm of bronchus and lung: Secondary | ICD-10-CM | POA: Diagnosis not present

## 2022-03-25 DIAGNOSIS — I2510 Atherosclerotic heart disease of native coronary artery without angina pectoris: Secondary | ICD-10-CM | POA: Diagnosis present

## 2022-03-25 DIAGNOSIS — Z20822 Contact with and (suspected) exposure to covid-19: Secondary | ICD-10-CM | POA: Diagnosis present

## 2022-03-25 DIAGNOSIS — J441 Chronic obstructive pulmonary disease with (acute) exacerbation: Secondary | ICD-10-CM

## 2022-03-25 DIAGNOSIS — I110 Hypertensive heart disease with heart failure: Secondary | ICD-10-CM | POA: Diagnosis present

## 2022-03-25 DIAGNOSIS — J439 Emphysema, unspecified: Secondary | ICD-10-CM | POA: Diagnosis present

## 2022-03-25 DIAGNOSIS — Z6827 Body mass index (BMI) 27.0-27.9, adult: Secondary | ICD-10-CM | POA: Diagnosis not present

## 2022-03-25 DIAGNOSIS — I25810 Atherosclerosis of coronary artery bypass graft(s) without angina pectoris: Secondary | ICD-10-CM | POA: Diagnosis present

## 2022-03-25 DIAGNOSIS — Z87891 Personal history of nicotine dependence: Secondary | ICD-10-CM | POA: Diagnosis not present

## 2022-03-25 DIAGNOSIS — R739 Hyperglycemia, unspecified: Secondary | ICD-10-CM | POA: Diagnosis present

## 2022-03-25 DIAGNOSIS — I4510 Unspecified right bundle-branch block: Secondary | ICD-10-CM | POA: Diagnosis present

## 2022-03-25 DIAGNOSIS — I252 Old myocardial infarction: Secondary | ICD-10-CM | POA: Diagnosis not present

## 2022-03-25 DIAGNOSIS — R778 Other specified abnormalities of plasma proteins: Secondary | ICD-10-CM | POA: Diagnosis not present

## 2022-03-25 DIAGNOSIS — E663 Overweight: Secondary | ICD-10-CM | POA: Diagnosis present

## 2022-03-25 DIAGNOSIS — F32A Depression, unspecified: Secondary | ICD-10-CM | POA: Diagnosis present

## 2022-03-25 HISTORY — DX: Chronic obstructive pulmonary disease with (acute) exacerbation: J44.1

## 2022-03-25 LAB — CBC
HCT: 38.6 % — ABNORMAL LOW (ref 39.0–52.0)
Hemoglobin: 12.5 g/dL — ABNORMAL LOW (ref 13.0–17.0)
MCH: 30.9 pg (ref 26.0–34.0)
MCHC: 32.4 g/dL (ref 30.0–36.0)
MCV: 95.5 fL (ref 80.0–100.0)
Platelets: 255 10*3/uL (ref 150–400)
RBC: 4.04 MIL/uL — ABNORMAL LOW (ref 4.22–5.81)
RDW: 13 % (ref 11.5–15.5)
WBC: 12 10*3/uL — ABNORMAL HIGH (ref 4.0–10.5)
nRBC: 0 % (ref 0.0–0.2)

## 2022-03-25 LAB — COMPREHENSIVE METABOLIC PANEL
ALT: 17 U/L (ref 0–44)
AST: 20 U/L (ref 15–41)
Albumin: 3.6 g/dL (ref 3.5–5.0)
Alkaline Phosphatase: 59 U/L (ref 38–126)
Anion gap: 8 (ref 5–15)
BUN: 21 mg/dL (ref 8–23)
CO2: 27 mmol/L (ref 22–32)
Calcium: 8.8 mg/dL — ABNORMAL LOW (ref 8.9–10.3)
Chloride: 103 mmol/L (ref 98–111)
Creatinine, Ser: 1.2 mg/dL (ref 0.61–1.24)
GFR, Estimated: >60 mL/min (ref >60)
Glucose, Bld: 130 mg/dL — ABNORMAL HIGH (ref 70–99)
Potassium: 3.9 mmol/L (ref 3.5–5.1)
Sodium: 138 mmol/L (ref 135–145)
Total Bilirubin: 0.5 mg/dL (ref 0.3–1.2)
Total Protein: 6.4 g/dL — ABNORMAL LOW (ref 6.5–8.1)

## 2022-03-25 LAB — RESPIRATORY PANEL BY PCR

## 2022-03-25 MED ORDER — METHYLPREDNISOLONE SODIUM SUCC 125 MG IJ SOLR
125.0000 mg | Freq: Every day | INTRAMUSCULAR | Status: DC
  Administered 2022-03-25 – 2022-03-27 (×3): 125 mg via INTRAVENOUS
  Filled 2022-03-25 (×3): qty 2

## 2022-03-25 MED ORDER — ARFORMOTEROL TARTRATE 15 MCG/2ML IN NEBU
15.0000 ug | INHALATION_SOLUTION | Freq: Two times a day (BID) | RESPIRATORY_TRACT | Status: DC
Start: 2022-03-25 — End: 2022-03-26
  Administered 2022-03-25 – 2022-03-26 (×2): 15 ug via RESPIRATORY_TRACT
  Filled 2022-03-25 (×2): qty 2

## 2022-03-25 MED ORDER — IPRATROPIUM-ALBUTEROL 0.5-2.5 (3) MG/3ML IN SOLN
3.0000 mL | Freq: Three times a day (TID) | RESPIRATORY_TRACT | Status: DC
Start: 2022-03-25 — End: 2022-03-27
  Administered 2022-03-25 – 2022-03-27 (×5): 3 mL via RESPIRATORY_TRACT
  Filled 2022-03-25 (×6): qty 3

## 2022-03-25 MED ORDER — BUDESONIDE 0.25 MG/2ML IN SUSP
0.2500 mg | Freq: Two times a day (BID) | RESPIRATORY_TRACT | Status: DC
  Administered 2022-03-25 – 2022-03-26 (×2): 0.25 mg via RESPIRATORY_TRACT
  Filled 2022-03-25 (×2): qty 2

## 2022-03-25 MED ORDER — GUAIFENESIN ER 600 MG PO TB12
1200.0000 mg | ORAL_TABLET | Freq: Two times a day (BID) | ORAL | Status: DC
  Administered 2022-03-25 – 2022-03-27 (×4): 1200 mg via ORAL
  Filled 2022-03-25 (×4): qty 2

## 2022-03-25 MED ORDER — METHYLPREDNISOLONE SODIUM SUCC 125 MG IJ SOLR: 60.0000 mg | Freq: Every day | INTRAMUSCULAR | Status: DC

## 2022-03-25 NOTE — Progress Notes (Signed)
PROGRESS NOTE    Gary Wright  POE:[id number] DOB: 1945-11-07 DOA: 03/24/2022 PCP: Gaye Alken, PA-C   Brief Narrative:  The patient is a 76 year old overweight Caucasian male with past medical history significant for but limited to CAD, COPD, history of elevated troponin, history of lung cancer status post left upper lobe lobectomy, hyperlipidemia, hypertension, prolonged QTc, history of stress-induced cardiomyopathy as well as other comorbidities who presented to the ED department with sudden onset of shortness of breath associated wheezing, chest pressure-like sensation associate with palpitations and diaphoresis while he was sitting working on possible and did not get relief with nebulized treatments at home.  EMS was called and they given 2 DuoNebs in route and he has frequent rhinorrhea but denies any fevers, chills, sweats or hemoptysis.  Occasionally gets orthopneic but denies any PND or pitting edema to lower extremities.  Upon arrival to ED his respiratory status showed that he had a respirations of 18 and is tachycardic with a pulse of 113.  He received another DuoNeb and received methylprednisolone 1.5 mg in the ED and then mag sulfate.  Given his elevated troponin cardiology was consulted for further evaluation recommendations.  He underwent echocardiogram  Assessment and Plan:  Acute Respiratory Failure in the setting of Acute Exacerbation of COPD -Placed in observation in the PCU and then changed to inpatient -Given IV Solu-Medrol 125 mg in the ED and then was placed on p.o. prednisone but given his continued wheeze will change to Solu-Medrol 60 mg every 12 -He is on scheduled DuoNeb 3 mL 3 times daily and then has as needed albuterol nebs 2.5 mg neb every 4 as needed for wheezing or shortness of breath and will be placed on Brovana and budesonide -Added guaifenesin 1200 mg p.o. twice daily, flutter valve, incentive spirometry -Repeat chest x-ray in a.m. -SpO2: 96 % O2 Flow  Rate (L/min): 2 L/min -Check respiratory virus panel -WBC went from 15.6 -> 12.0 -Continuous pulse oximetry maintain O2 saturation greater than 90% -Continue supplemental oxygen via nasal cannula and wean O2 to room air -Patient will need an ambulatory home O2 screen prior to discharge as well as a repeat chest x-ray -If not improving may need more pulmonary evaluation  Depression and Anxiety -Continue with bupropion 300 g p.o. daily as well as escitalopram for the last year daily -Follow-up with PCP and psychiatry in outpatient setting  Essential Hypertension -Continuing lisinopril 40 mils p.o. daily as well as metoprolol 12.5 mg p.o. twice daily -Continue monitor blood pressures per protocol -Last blood pressure reading was 135/66  Elevated troponin associated with chest tightness in the setting of COPD exacerbation acute dyspnea -Had no acute EKG changes and this is most likely demand ischemia with acute exacerbation -He had a cath previously had minimal CAD in 2017 and a negative stress echo in 2019 -Cardiology recommends continuing aspirin, statin, beta-blocker and ACE -Troponin value went from 261 is now 240; BNP was 80.6 -They are recommending outpatient nuclear Lexiscan stress test once he is over his acute pulmonary illness -Echocardiogram was done and he has had a history of Takotsubo with nonobstructive left heart cath and STE in a cath estimated EF of 25 to 30% by TTE few days later showed an EF of 50 to 55% with no apical ballooning -Repeat echo here shows the wall motion is changed to assess and he has some inferior/inferior septal/inferolateral wall to be hypokinetic at the base and apex and EF of 40 to 45% and the left ventricular diastolic parameters  were indeterminate -Cardiology still recommends outpatient Lexiscan Myoview and they feel that patient has no concern for acute CHF exacerbation  History of lung cancer with left upper lobe lobectomy -Follows with pulmonary  in [place] -May need pulmonary   Hyperlipidemia -Continue with Atorvastatin 40 g p.o. daily -Cardiology is repeating a lipid panel here  Hypocalcemia -Patient's Ca2+ was 8.7 and repeat was 8.8 -Continue monitor and trend and repeat CMP in a.m.  Hyperglycemia -Likely to worsen in the setting of Steroid Demargination -Blood sugars ranging from 130-131 on daily BMP/CMP -Check hemoglobin A1c in a.m. and continue monitor blood sugars carefully and if necessary will place on sensitive NovoLog/scale insulin AC  DVT prophylaxis: enoxaparin (LOVENOX) injection 40 mg Start: 03/24/22 1800    Code Status: Full Code Family Communication: None  Disposition Plan:  Level of care: Progressive Status is: Inpatient Remains inpatient appropriate because: Continues to be dyspneic on oxygen.  Still wheezing quite significantly.   Consultants:  Cardiology  Procedures:  Echocardiogram IMPRESSIONS     1. Wall motion is challenging to asses.  inferior/inferoseptal/inferolateral wall appears hypokinetic base-apex.  Left ventricular ejection fraction, by estimation, is 40 to 45%. The left  ventricle has mildly decreased function. Left ventricular diastolic   parameters are indeterminate.   2. Right ventricular systolic function is normal. The right ventricular  size is normal.   3. The mitral valve is normal in structure. No evidence of mitral valve  regurgitation.   4. There is mild calcification of the aortic valve. Aortic valve  regurgitation is not visualized.   5. The inferior vena cava is normal in size with greater than 50%  respiratory variability, suggesting right atrial pressure of 3 mmHg.   Conclusion(s)/Recommendation(s): Endocardial borders are challenging, does  appear to have possible inferior/apical WMA.   FINDINGS   Left Ventricle: Wall motion is challenging to asses.  inferior/inferoseptal/inferolateral wall appears hypokinetic base-apex.  Left ventricular ejection  fraction, by estimation, is 40 to 45%. The left  ventricle has mildly decreased function. Definity  contrast agent was given IV to delineate the left ventricular endocardial  borders. The left ventricular internal cavity size was normal in size.  There is no left ventricular hypertrophy. Left ventricular diastolic  parameters are indeterminate.   Right Ventricle: The right ventricular size is normal. Right ventricular  systolic function is normal.   Left Atrium: Left atrial size was normal in size.   Right Atrium: Right atrial size was normal in size.   Pericardium: There is no evidence of pericardial effusion.   Mitral Valve: The mitral valve is normal in structure. No evidence of  mitral valve regurgitation.   Tricuspid Valve: Tricuspid valve regurgitation is not demonstrated.   Aortic Valve: There is mild calcification of the aortic valve. Aortic  valve regurgitation is not visualized. Aortic valve peak gradient measures  4.0 mmHg.   Pulmonic Valve: Pulmonic valve regurgitation is not visualized.   Venous: The inferior vena cava is normal in size with greater than 50%  respiratory variability, suggesting right atrial pressure of 3 mmHg.      LEFT VENTRICLE  PLAX 2D  LVIDd:         4.70 cm      Diastology  LVIDs:         3.00 cm      LV e' medial:    3.48 cm/s  LV PW:         1.00 cm      LV E/e' medial:  11.8  LV IVS:        1.00 cm      LV e' lateral:   3.70 cm/s  LVOT diam:     2.00 cm      LV E/e' lateral: 11.1  LV SV:         52  LV SV Index:   26  LVOT Area:     3.14 cm     LV Volumes (MOD)  LV vol d, MOD A2C: 119.0 ml  LV vol d, MOD A4C: 132.0 ml  LV vol s, MOD A2C: 64.1 ml  LV vol s, MOD A4C: 68.9 ml  LV SV MOD A2C:     54.9 ml  LV SV MOD A4C:     132.0 ml  LV SV MOD BP:      60.1 ml   RIGHT VENTRICLE             IVC  RV Basal diam:  3.30 cm     IVC diam: 1.20 cm  RV Mid diam:    2.90 cm  RV S prime:     12.10 cm/s  TAPSE (M-mode): 1.7 cm   LEFT  ATRIUM             Index        RIGHT ATRIUM           Index  LA diam:        3.60 cm 1.80 cm/m   RA Area:     11.90 cm  LA Vol (A2C):   30.9 ml 15.42 ml/m  RA Volume:   29.10 ml  14.53 ml/m  LA Vol (A4C):   27.7 ml 13.83 ml/m  LA Biplane Vol: 29.5 ml 14.73 ml/m   AORTIC VALVE  AV Area (Vmax): 2.73 cm  AV Vmax:        100.00 cm/s  AV Peak Grad:   4.0 mmHg  LVOT Vmax:      86.80 cm/s  LVOT Vmean:     64.900 cm/s  LVOT VTI:       0.166 m     AORTA  Ao Root diam: 2.80 cm   MITRAL VALVE  MV Area (PHT): 6.07 cm    SHUNTS  MV Decel Time: 125 msec    Systemic VTI:  0.17 m  MV E velocity: 41.00 cm/s  Systemic Diam: 2.00 cm  MV A velocity: 95.20 cm/s  MV E/A ratio:  0.43   Antimicrobials:  Anti-infectives (From admission, onward)    None       Subjective: Seen and examined at bedside and he thinks he is doing little bit better from a respiratory standpoint but continues to wheeze significantly.  Denies any more chest pain.  Feels better than he did yesterday.  Denies any lightheadedness or dizziness.  No other concerns or complaints at this time.  Objective: Vitals:   03/25/22 0822 03/25/22 1233 03/25/22 1505 03/25/22 1557  BP:  135/66    Pulse:  (!) 103    Resp:  20    Temp:  97.9 F (36.6 C)    TempSrc:  Oral    SpO2: 98% 97% 96% 96%  Weight:      Height:        Intake/Output Summary (Last 24 hours) at 03/25/2022 1559 Last data filed at 03/25/2022 0900 Gross per 24 hour  Intake 480 ml  Output --  Net 480 ml   Filed Weights   03/24/22 1118 03/24/22 1525  Weight: 84.4 kg 81 kg   Examination: Physical Exam:  Constitutional: WN/WD elderly Caucasian male currently in mild respiratory distress Respiratory: Diminished to auscultation bilaterally with coarse breath sounds and some wheezing noted no appreciable, rales, rhonchi or crackles. Normal respiratory effort and patient is not tachypenic. No accessory muscle use.  Wearing supplemental oxygen nasal  cannula Cardiovascular: RRR, no murmurs / rubs / gallops. S1 and S2 auscultated.  Minimal extremity edema Abdomen: Soft, non-tender, distended second body habitus. Bowel sounds positive.  GU: Deferred. Musculoskeletal: No clubbing / cyanosis of digits/nails. No joint deformity upper and lower extremities.  Neurologic: CN 2-12 grossly intact with no focal deficits.  Romberg sign and cerebellar reflexes not assessed.  Psychiatric: Normal judgment and insight. Alert and oriented x 3. Normal mood and appropriate affect.   Data Reviewed: I have personally reviewed following labs and imaging studies  CBC: Recent Labs  Lab 03/24/22 1119 03/25/22 0349  WBC 15.6* 12.0*  NEUTROABS 13.4*  --   HGB 13.5 12.5*  HCT 41.2 38.6*  MCV 95.8 95.5  PLT 260 093   Basic Metabolic Panel: Recent Labs  Lab 03/24/22 1119 03/24/22 1328 03/25/22 0349  NA 140  --  138  K 4.1  --  3.9  CL 107  --  103  CO2 26  --  27  GLUCOSE 131*  --  130*  BUN 17  --  21  CREATININE 1.24  --  1.20  CALCIUM 8.7*  --  8.8*  PHOS  --  2.4*  --    GFR: Estimated Creatinine Clearance: 53.2 mL/min (by C-G formula based on SCr of 1.2 mg/dL). Liver Function Tests: Recent Labs  Lab 03/24/22 1119 03/25/22 0349  AST 20 20  ALT 19 17  ALKPHOS 64 59  BILITOT 0.6 0.5  PROT 6.7 6.4*  ALBUMIN 4.0 3.6   No results for input(s): LIPASE, AMYLASE in the last 168 hours. No results for input(s): AMMONIA in the last 168 hours. Coagulation Profile: No results for input(s): INR, PROTIME in the last 168 hours. Cardiac Enzymes: No results for input(s): CKTOTAL, CKMB, CKMBINDEX, TROPONINI in the last 168 hours. BNP (last 3 results) No results for input(s): PROBNP in the last 8760 hours. HbA1C: No results for input(s): HGBA1C in the last 72 hours. CBG: No results for input(s): GLUCAP in the last 168 hours. Lipid Profile: No results for input(s): CHOL, HDL, LDLCALC, TRIG, CHOLHDL, LDLDIRECT in the last 72 hours. Thyroid  Function Tests: No results for input(s): TSH, T4TOTAL, FREET4, T3FREE, THYROIDAB in the last 72 hours. Anemia Panel: No results for input(s): VITAMINB12, FOLATE, FERRITIN, TIBC, IRON, RETICCTPCT in the last 72 hours. Sepsis Labs: No results for input(s): PROCALCITON, LATICACIDVEN in the last 168 hours.  Recent Results (from the past 240 hour(s))  SARS Coronavirus 2 by RT PCR (hospital order, performed in [facility] hospital lab) *cepheid single result test* Anterior Nasal Swab     Status: None   Collection Time: 03/24/22 11:44 AM   Specimen: Anterior Nasal Swab  Result Value Ref Range Status   SARS Coronavirus 2 by RT PCR NEGATIVE NEGATIVE Final    Comment: (NOTE) SARS-CoV-2 target nucleic acids are NOT DETECTED.  The SARS-CoV-2 RNA is generally detectable in upper and lower respiratory specimens during the acute phase of infection. The lowest concentration of SARS-CoV-2 viral copies this assay can detect is 250 copies / mL. A negative result does not preclude SARS-CoV-2 infection and should not be used as the sole basis for  treatment or other patient management decisions.  A negative result may occur with improper specimen collection / handling, submission of specimen other than nasopharyngeal swab, presence of viral mutation(s) within the areas targeted by this assay, and inadequate number of viral copies (<250 copies / mL). A negative result must be combined with clinical observations, patient history, and epidemiological information.  Fact Sheet for Patients:   https://www.patel.info/  Fact Sheet for Healthcare Providers: https://hall.com/  This test is not yet approved or  cleared by the [place] FDA and has been authorized for detection and/or diagnosis of SARS-CoV-2 by FDA under an Emergency Use Authorization (EUA).  This EUA will remain in effect (meaning this test can be used) for the duration of the COVID-19 declaration  under Section 564(b)(1) of the Act, 21 U.S.C. section 360bbb-3(b)(1), unless the authorization is terminated or revoked sooner.  Performed at [facility], [place] [address], [place], [place] [address]      Radiology Studies: DG Chest Portable 1 View  Result Date: 03/24/2022 CLINICAL DATA:  Acute shortness of breath EXAM: PORTABLE CHEST 1 VIEW COMPARISON:  11/02/2020 and prior studies FINDINGS: The cardiomediastinal silhouette is unremarkable. LEFT lobectomy changes again noted. Emphysema again noted. There is no evidence of focal airspace disease, pulmonary edema, suspicious pulmonary nodule/mass, pleural effusion, or pneumothorax. No acute bony abnormalities are identified. IMPRESSION: Emphysema without evidence of acute cardiopulmonary disease. Electronically Signed   By: Margarette Canada M.D.   On: 03/24/2022 11:34   ECHOCARDIOGRAM COMPLETE  Result Date: 03/24/2022    ECHOCARDIOGRAM REPORT   Patient Name:   SUHAIL PELOQUIN Date of Exam: 03/24/2022 Medical Rec #:  [id number]      Height:       69.0 in Accession #:    [id number]     Weight:       186.0 lb Date of Birth:  [birth year]     BSA:          2.003 m Patient Age:    7 years       BP:           115/85 mmHg Patient Gender: M              HR:           96 bpm. Exam Location:  Inpatient Procedure: 2D Echo, Cardiac Doppler, Color Doppler and Intracardiac            Opacification Agent Indications:    Elevated troponin  History:        Patient has prior history of Echocardiogram examinations. CAD,                 COPD; Risk Factors:Hypertension.  Sonographer:    Jyl Heinz Referring Phys: [id number] DAVID MANUEL ORTIZ  Sonographer Comments: Technically difficult study due to poor echo windows. IMPRESSIONS  1. Wall motion is challenging to asses. inferior/inferoseptal/inferolateral wall appears hypokinetic base-apex. Left ventricular ejection fraction, by estimation, is 40 to 45%. The left ventricle has mildly decreased function. Left  ventricular diastolic  parameters are indeterminate.  2. Right ventricular systolic function is normal. The right ventricular size is normal.  3. The mitral valve is normal in structure. No evidence of mitral valve regurgitation.  4. There is mild calcification of the aortic valve. Aortic valve regurgitation is not visualized.  5. The inferior vena cava is normal in size with greater than 50% respiratory variability, suggesting right atrial pressure of 3 mmHg. Conclusion(s)/Recommendation(s): Endocardial borders are challenging, does appear to  have possible inferior/apical WMA. FINDINGS  Left Ventricle: Wall motion is challenging to asses. inferior/inferoseptal/inferolateral wall appears hypokinetic base-apex. Left ventricular ejection fraction, by estimation, is 40 to 45%. The left ventricle has mildly decreased function. Definity contrast agent was given IV to delineate the left ventricular endocardial borders. The left ventricular internal cavity size was normal in size. There is no left ventricular hypertrophy. Left ventricular diastolic parameters are indeterminate. Right Ventricle: The right ventricular size is normal. Right ventricular systolic function is normal. Left Atrium: Left atrial size was normal in size. Right Atrium: Right atrial size was normal in size. Pericardium: There is no evidence of pericardial effusion. Mitral Valve: The mitral valve is normal in structure. No evidence of mitral valve regurgitation. Tricuspid Valve: Tricuspid valve regurgitation is not demonstrated. Aortic Valve: There is mild calcification of the aortic valve. Aortic valve regurgitation is not visualized. Aortic valve peak gradient measures 4.0 mmHg. Pulmonic Valve: Pulmonic valve regurgitation is not visualized. Venous: The inferior vena cava is normal in size with greater than 50% respiratory variability, suggesting right atrial pressure of 3 mmHg.  LEFT VENTRICLE PLAX 2D LVIDd:         4.70 cm      Diastology LVIDs:          3.00 cm      LV e' medial:    3.48 cm/s LV PW:         1.00 cm      LV E/e' medial:  11.8 LV IVS:        1.00 cm      LV e' lateral:   3.70 cm/s LVOT diam:     2.00 cm      LV E/e' lateral: 11.1 LV SV:         52 LV SV Index:   26 LVOT Area:     3.14 cm  LV Volumes (MOD) LV vol d, MOD A2C: 119.0 ml LV vol d, MOD A4C: 132.0 ml LV vol s, MOD A2C: 64.1 ml LV vol s, MOD A4C: 68.9 ml LV SV MOD A2C:     54.9 ml LV SV MOD A4C:     132.0 ml LV SV MOD BP:      60.1 ml RIGHT VENTRICLE             IVC RV Basal diam:  3.30 cm     IVC diam: 1.20 cm RV Mid diam:    2.90 cm RV S prime:     12.10 cm/s TAPSE (M-mode): 1.7 cm LEFT ATRIUM             Index        RIGHT ATRIUM           Index LA diam:        3.60 cm 1.80 cm/m   RA Area:     11.90 cm LA Vol (A2C):   30.9 ml 15.42 ml/m  RA Volume:   29.10 ml  14.53 ml/m LA Vol (A4C):   27.7 ml 13.83 ml/m LA Biplane Vol: 29.5 ml 14.73 ml/m  AORTIC VALVE AV Area (Vmax): 2.73 cm AV Vmax:        100.00 cm/s AV Peak Grad:   4.0 mmHg LVOT Vmax:      86.80 cm/s LVOT Vmean:     64.900 cm/s LVOT VTI:       0.166 m  AORTA Ao Root diam: 2.80 cm MITRAL VALVE MV Area (PHT): 6.07 cm    SHUNTS MV Decel  Time: 125 msec    Systemic VTI:  0.17 m MV E velocity: 41.00 cm/s  Systemic Diam: 2.00 cm MV A velocity: 95.20 cm/s MV E/A ratio:  0.43 Landscape architect signed by Phineas Inches Signature Date/Time: 03/24/2022/3:17:46 PM    Final     Scheduled Meds:  aspirin EC  81 mg Oral Daily   atorvastatin  40 mg Oral q1800   buPROPion  300 mg Oral q morning   citalopram  40 mg Oral q morning   enoxaparin (LOVENOX) injection  40 mg Subcutaneous Q24H   feeding supplement  237 mL Oral BID BM   ipratropium-albuterol  3 mL Nebulization TID   lisinopril  40 mg Oral Q1200   mouth rinse  15 mL Mouth Rinse BID   methylPREDNISolone (SOLU-MEDROL) injection  60 mg Intravenous Q12H   metoprolol succinate  12.5 mg Oral Q1200   Continuous Infusions:   LOS: 0 days   Raiford Noble, DO [facility] Available via Epic secure chat 7am-7pm After these hours, please refer to coverage provider listed on [email or web address] 03/25/2022, 3:59 PM

## 2022-03-25 NOTE — Consult Note (Signed)
Cardiology Consultation:   Patient ID: Gary Wright MRN: [id number]; DOB: February 09, 1946  Admit date: 03/24/2022 Date of Consult: 03/25/2022  PCP:  Gaye Alken, [place] Prov[facility] Cardiologist:  Kirk Ruths, MD        Patient Profile:   Gary Wright is a 76 y.o. male with a hx of Non STEMI 2017 with cath and mild diffuse non obstructive disease, EF 25-30% consistent with takotsubo CM that resolved in 2 days, HTN, HLD, COPD,  hx of lung cancer who is being seen 03/25/2022 for the evaluation of  CAD due to elevated troponin at the request of Dr. Alfredia Ferguson.  History of Present Illness:   Gary Wright with prior hx as above and non STEMI in 2017 with cardiac cath with minimal CAD (at most 30% in LAD) but EF 25-35%.  DX of takotsubo and EF returned to normal by echo in 2 days.   Stress echo in 2019 with normal EF and neg for ischemia.  Gary Wright had been doing well.  Last saw Dr. Stanford Breed 2022.   Lung cancer Dx in 2019 and LUL lobectomy in 10/2018.  CT 11/2019 with no cancer recurrence.   Gary Wright presented to ER 03/24/22 with SOB, cough, chest pain.  Symptoms began yesterday and felt chest tightness along with cough difficulty breathing,  home breathing treatments were not helping.   EMS gave 2 duonebs in route CXR with emphysema, left lobectomy changes chronic, no acute issues.  In ER rec'd anther duo neb and aerosol 02 mask and IV methylprednisolone 125 mg and magnesium sulfate 2 g IVPB    He tells me he has had some chest tightness occ over last few months.  He could not decide if due to lungs or heart.  No edema. He does have dyspnea in bed.  He has not thought his albuterol is doing much good.   Hs troponin 261 and 240.  Na 138, K+ 3.9 BUN 21 Cr 1.20 BNP 80.6 WBC 15.6 and today 12  Hgb 12.5, plts 255 neut on admit 13.4   EKG:  The EKG was personally reviewed and demonstrates:  ST and RBBB no acute ST changes from 10/2020 Telemetry:  Telemetry was personally reviewed and demonstrates:   SR  Echo with EF 40-45%, otherwise fairly normal.   BP 128/63 P 91 R 21 T 98.1   No further chest discomfort this AM  Past Medical History:  Diagnosis Date   CAD (coronary artery disease) of artery bypass graft 06/13/2016   COPD (chronic obstructive pulmonary disease) ([place])    Depression 09/01/2007   Qualifier: Diagnosis of  By: Danny Lawless CMA, [place]     Elevated troponin 06/13/2016   History of lung cancer 11/02/2020   Hyperlipidemia 11/02/2020   Hypertension    Prolonged QT interval 11/02/2020   Stress-induced cardiomyopathy 06/13/2016    Past Surgical History:  Procedure Laterality Date   BACK SURGERY     fusion in [place] N/A 06/13/2016   Procedure: Left Heart Cath and Coronary Angiography;  Surgeon: Nelva Bush, MD;  Location: [facility];  Service: Cardiovascular;  Laterality: N/A;   INGUINAL HERNIA REPAIR Right 04/21/2018   Procedure: RIGHT INGUINAL HERNIA REPAIR ERAS PATHWAY;  Surgeon: Erroll Luna, MD;  Location: [place];  Service: General;  Laterality: Right;  TAP BLOCK   INSERTION OF MESH Right 04/21/2018   Procedure: INSERTION OF MESH;  Surgeon: Erroll Luna, MD;  Location: [facility];  Service: General;  Laterality: Right;  TONSILLECTOMY       Home Medications:  Prior to Admission medications   Medication Sig Start Date End Date Taking? Authorizing Provider  albuterol (PROVENTIL) (2.5 MG/3ML) 0.083% nebulizer solution Take 2.5 mg by nebulization 3 (three) times daily as needed for wheezing or shortness of breath. 02/26/22  Yes [provider]  albuterol (VENTOLIN HFA) 108 (90 Base) MCG/ACT inhaler Inhale 2 puffs into the lungs every 6 (six) hours as needed for wheezing or shortness of breath. 11/04/20  Yes Barb Merino, MD  aspirin 81 MG chewable tablet CHEW AND SWALLOW 1 TABLET(81 MG) BY MOUTH DAILY Patient taking differently: Chew 81 mg by mouth daily at 12 noon. 07/22/16  Yes Lelon Perla, MD  atorvastatin (LIPITOR) 40 MG  tablet TAKE 1 TABLET(40 MG) BY MOUTH DAILY AT 6 PM Patient taking differently: Take 40 mg by mouth daily at 6 PM. 05/08/21  Yes Stanford Breed, Denice Bors, MD  buPROPion (WELLBUTRIN XL) 300 MG 24 hr tablet Take 300 mg by mouth every morning. 05/11/16  Yes [provider]  citalopram (CELEXA) 40 MG tablet Take 40 mg by mouth every morning. 06/01/16  Yes [provider]  Fluticasone-Umeclidin-Vilant (TRELEGY ELLIPTA) 100-62.5-25 MCG/INH AEPB Inhale 1 puff into the lungs daily. Patient taking differently: Inhale 1 puff into the lungs every evening. 11/04/20  Yes Ghimire, Dante Gang, MD  lisinopril (ZESTRIL) 40 MG tablet TAKE 1 TABLET(40 MG) BY MOUTH DAILY. Schedule an appointment for further refils, 1st attempt Patient taking differently: Take 40 mg by mouth daily at 12 noon. . Schedule an appointment for further refils, 1st attempt 02/05/22  Yes Crenshaw, Denice Bors, MD  metoprolol succinate (TOPROL-XL) 25 MG 24 hr tablet TAKE 1/2 TABLET(12.5 MG) BY MOUTH DAILY Patient taking differently: Take 12.5 mg by mouth daily at 12 noon. 11/06/21  Yes Lelon Perla, MD    Inpatient Medications: Scheduled Meds:  aspirin EC  81 mg Oral Daily   atorvastatin  40 mg Oral q1800   buPROPion  300 mg Oral q morning   citalopram  40 mg Oral q morning   enoxaparin (LOVENOX) injection  40 mg Subcutaneous Q24H   feeding supplement  237 mL Oral BID BM   ipratropium-albuterol  3 mL Nebulization TID   lisinopril  40 mg Oral Q1200   mouth rinse  15 mL Mouth Rinse BID   metoprolol succinate  12.5 mg Oral Q1200   predniSONE  40 mg Oral Q breakfast   Continuous Infusions:  PRN Meds: acetaminophen **OR** acetaminophen, albuterol, prochlorperazine  Allergies:   No Known Allergies  Social History:   Social History   Socioeconomic History   Marital status: Legally Separated    Spouse name: Not on file   Number of children: Not on file   Years of education: Not on file   Highest education level: Not on file   Occupational History   Not on file  Tobacco Use   Smoking status: Former    Packs/day: 0.50    Types: Cigarettes    Quit date: 2019    Years since quitting: 4.4   Smokeless tobacco: Never  Vaping Use   Vaping Use: Never used  Substance and Sexual Activity   Alcohol use: No    Comment: No alcohol for 17 years   Drug use: No   Sexual activity: Not on file  Other Topics Concern   Not on file  Social History Narrative   Not on file   Social Determinants of Health   Financial  Resource Strain: Not on file  Food Insecurity: Not on file  Transportation Needs: Not on file  Physical Activity: Not on file  Stress: Not on file  Social Connections: Not on file  Intimate Partner Violence: Not on file    Family History:    Family History  Problem Relation Age of Onset   Hypertension Mother    Hypertension Father      ROS:  Please see the history of present illness.  General:no colds or fevers, no weight changes Skin:no rashes or ulcers HEENT:no blurred vision, no congestion CV:see HPI PUL:see HPI GI:no diarrhea constipation or melena, no indigestion GU:no hematuria, no dysuria MS:no joint pain, no claudication Neuro:no syncope, no lightheadedness Endo:no diabetes, no thyroid disease  All other ROS reviewed and negative.     Physical Exam/Data:   Vitals:   03/25/22 0011 03/25/22 0355 03/25/22 0820 03/25/22 0822  BP: 128/77 128/63    Pulse: 91 90    Resp: (!) 23 (!) 21    Temp: 97.6 F (36.4 C) 98.1 F (36.7 C)    TempSrc: Oral Oral    SpO2: 98% 98% 98% 98%  Weight:      Height:        Intake/Output Summary (Last 24 hours) at 03/25/2022 1006 Last data filed at 03/25/2022 0900 Gross per 24 hour  Intake 477 ml  Output --  Net 477 ml      03/24/2022    3:25 PM 03/24/2022   11:18 AM 01/18/2021    1:21 PM  Last 3 Weights  Weight (lbs) 178 lb 9.2 oz 186 lb 196 lb 9.6 oz  Weight (kg) 81 kg 84.369 kg 89.177 kg     Body mass index is 26.37 kg/m.  General:  Well  nourished, well developed, in no acute distress HEENT: normal Neck: no JVD sitting up in bed Vascular: No carotid bruits; Distal pulses 2+ bilaterally Cardiac:  normal S1, S2; RRR; no murmur gallup rub or click Lungs:  exp wheezes to auscultation bilaterally, + rhonchi no rales  Abd: soft, nontender, no hepatomegaly  Ext: no edema Musculoskeletal:  No deformities, BUE and BLE strength normal and equal Skin: warm and dry  Neuro:  alert and oriented X 3 MAE follows commands, no focal abnormalities noted Psych:  Normal affect    Relevant CV Studies: Echo 03/24/22  IMPRESSIONS     1. Wall motion is challenging to asses.  inferior/inferoseptal/inferolateral wall appears hypokinetic base-apex.  Left ventricular ejection fraction, by estimation, is 40 to 45%. The left  ventricle has mildly decreased function. Left ventricular diastolic   parameters are indeterminate.   2. Right ventricular systolic function is normal. The right ventricular  size is normal.   3. The mitral valve is normal in structure. No evidence of mitral valve  regurgitation.   4. There is mild calcification of the aortic valve. Aortic valve  regurgitation is not visualized.   5. The inferior vena cava is normal in size with greater than 50%  respiratory variability, suggesting right atrial pressure of 3 mmHg.   Conclusion(s)/Recommendation(s): Endocardial borders are challenging, does  appear to have possible inferior/apical WMA.   FINDINGS   Left Ventricle: Wall motion is challenging to asses.  inferior/inferoseptal/inferolateral wall appears hypokinetic base-apex.  Left ventricular ejection fraction, by estimation, is 40 to 45%. The left  ventricle has mildly decreased function. Definity  contrast agent was given IV to delineate the left ventricular endocardial  borders. The left ventricular internal cavity size was normal  in size.  There is no left ventricular hypertrophy. Left ventricular diastolic   parameters are indeterminate.   Right Ventricle: The right ventricular size is normal. Right ventricular  systolic function is normal.   Left Atrium: Left atrial size was normal in size.   Right Atrium: Right atrial size was normal in size.   Pericardium: There is no evidence of pericardial effusion.   Mitral Valve: The mitral valve is normal in structure. No evidence of  mitral valve regurgitation.   Tricuspid Valve: Tricuspid valve regurgitation is not demonstrated.   Aortic Valve: There is mild calcification of the aortic valve. Aortic  valve regurgitation is not visualized. Aortic valve peak gradient measures  4.0 mmHg.   Pulmonic Valve: Pulmonic valve regurgitation is not visualized.   Venous: The inferior vena cava is normal in size with greater than 50%  respiratory variability, suggesting right atrial pressure of 3 mmHg.   Stress Echo 2019 at [facility] with normal EF at rest and negative stress echo.  Echo 2017 Study Conclusions   - Left ventricle: The cavity size was normal. Systolic function was    normal. The estimated ejection fraction was in the range of 50%    to 55%. Wall motion was normal; there were no regional wall    motion abnormalities.  - Right ventricle: The cavity size was dilated. And valves were normal.   Cardiac Cath 2017 1. Mild diffuse nonobstructive CAD 2. Severe segmental LV dysfunction with LVEF estimated at 25-30%   Clinical scenario consistent with Acute Takotsubo Syndrome.  Will initiate medical therapy with careful addition of lisinopril and metoprolol succinate, to be uptitrated as blood pressure, heart rate, and breathing allow.  Laboratory Data:  High Sensitivity Troponin:   Recent Labs  Lab 03/24/22 1119 03/24/22 1328  TROPONINIHS 261* 240*     Chemistry Recent Labs  Lab 03/24/22 1119 03/25/22 0349  NA 140 138  K 4.1 3.9  CL 107 103  CO2 26 27  GLUCOSE 131* 130*  BUN 17 21  CREATININE 1.24 1.20  CALCIUM 8.7*  8.8*  GFRNONAA >60 >60  ANIONGAP 7 8    Recent Labs  Lab 03/24/22 1119 03/25/22 0349  PROT 6.7 6.4*  ALBUMIN 4.0 3.6  AST 20 20  ALT 19 17  ALKPHOS 64 59  BILITOT 0.6 0.5   Lipids No results for input(s): CHOL, TRIG, HDL, LABVLDL, LDLCALC, CHOLHDL in the last 168 hours.  Hematology Recent Labs  Lab 03/24/22 1119 03/25/22 0349  WBC 15.6* 12.0*  RBC 4.30 4.04*  HGB 13.5 12.5*  HCT 41.2 38.6*  MCV 95.8 95.5  MCH 31.4 30.9  MCHC 32.8 32.4  RDW 13.0 13.0  PLT 260 255   Thyroid No results for input(s): TSH, FREET4 in the last 168 hours.  BNP Recent Labs  Lab 03/24/22 1120  BNP 80.6    DDimer No results for input(s): DDIMER in the last 168 hours.   Radiology/Studies:  DG Chest Portable 1 View  Result Date: 03/24/2022 CLINICAL DATA:  Acute shortness of breath EXAM: PORTABLE CHEST 1 VIEW COMPARISON:  11/02/2020 and prior studies FINDINGS: The cardiomediastinal silhouette is unremarkable. LEFT lobectomy changes again noted. Emphysema again noted. There is no evidence of focal airspace disease, pulmonary edema, suspicious pulmonary nodule/mass, pleural effusion, or pneumothorax. No acute bony abnormalities are identified. IMPRESSION: Emphysema without evidence of acute cardiopulmonary disease. Electronically Signed   By: Margarette Canada M.D.   On: 03/24/2022 11:34   ECHOCARDIOGRAM COMPLETE  Result Date:  03/24/2022    ECHOCARDIOGRAM REPORT   Patient Name:   Gary Wright Date of Exam: 03/24/2022 Medical Rec #:  [id number]      Height:       69.0 in Accession #:    [id number]     Weight:       186.0 lb Date of Birth:  1945-11-13     BSA:          2.003 m Patient Age:    63 years       BP:           115/85 mmHg Patient Gender: M              HR:           96 bpm. Exam Location:  Inpatient Procedure: 2D Echo, Cardiac Doppler, Color Doppler and Intracardiac            Opacification Agent Indications:    Elevated troponin  History:        Patient has prior history of Echocardiogram  examinations. CAD,                 COPD; Risk Factors:Hypertension.  Sonographer:    Jyl Heinz Referring Phys: [id number] DAVID MANUEL ORTIZ  Sonographer Comments: Technically difficult study due to poor echo windows. IMPRESSIONS  1. Wall motion is challenging to asses. inferior/inferoseptal/inferolateral wall appears hypokinetic base-apex. Left ventricular ejection fraction, by estimation, is 40 to 45%. The left ventricle has mildly decreased function. Left ventricular diastolic  parameters are indeterminate.  2. Right ventricular systolic function is normal. The right ventricular size is normal.  3. The mitral valve is normal in structure. No evidence of mitral valve regurgitation.  4. There is mild calcification of the aortic valve. Aortic valve regurgitation is not visualized.  5. The inferior vena cava is normal in size with greater than 50% respiratory variability, suggesting right atrial pressure of 3 mmHg. Conclusion(s)/Recommendation(s): Endocardial borders are challenging, does appear to have possible inferior/apical WMA. FINDINGS  Left Ventricle: Wall motion is challenging to asses. inferior/inferoseptal/inferolateral wall appears hypokinetic base-apex. Left ventricular ejection fraction, by estimation, is 40 to 45%. The left ventricle has mildly decreased function. Definity contrast agent was given IV to delineate the left ventricular endocardial borders. The left ventricular internal cavity size was normal in size. There is no left ventricular hypertrophy. Left ventricular diastolic parameters are indeterminate. Right Ventricle: The right ventricular size is normal. Right ventricular systolic function is normal. Left Atrium: Left atrial size was normal in size. Right Atrium: Right atrial size was normal in size. Pericardium: There is no evidence of pericardial effusion. Mitral Valve: The mitral valve is normal in structure. No evidence of mitral valve regurgitation. Tricuspid Valve: Tricuspid valve  regurgitation is not demonstrated. Aortic Valve: There is mild calcification of the aortic valve. Aortic valve regurgitation is not visualized. Aortic valve peak gradient measures 4.0 mmHg. Pulmonic Valve: Pulmonic valve regurgitation is not visualized. Venous: The inferior vena cava is normal in size with greater than 50% respiratory variability, suggesting right atrial pressure of 3 mmHg.  LEFT VENTRICLE PLAX 2D LVIDd:         4.70 cm      Diastology LVIDs:         3.00 cm      LV e' medial:    3.48 cm/s LV PW:         1.00 cm      LV E/e' medial:  11.8 LV IVS:  1.00 cm      LV e' lateral:   3.70 cm/s LVOT diam:     2.00 cm      LV E/e' lateral: 11.1 LV SV:         52 LV SV Index:   26 LVOT Area:     3.14 cm  LV Volumes (MOD) LV vol d, MOD A2C: 119.0 ml LV vol d, MOD A4C: 132.0 ml LV vol s, MOD A2C: 64.1 ml LV vol s, MOD A4C: 68.9 ml LV SV MOD A2C:     54.9 ml LV SV MOD A4C:     132.0 ml LV SV MOD BP:      60.1 ml RIGHT VENTRICLE             IVC RV Basal diam:  3.30 cm     IVC diam: 1.20 cm RV Mid diam:    2.90 cm RV S prime:     12.10 cm/s TAPSE (M-mode): 1.7 cm LEFT ATRIUM             Index        RIGHT ATRIUM           Index LA diam:        3.60 cm 1.80 cm/m   RA Area:     11.90 cm LA Vol (A2C):   30.9 ml 15.42 ml/m  RA Volume:   29.10 ml  14.53 ml/m LA Vol (A4C):   27.7 ml 13.83 ml/m LA Biplane Vol: 29.5 ml 14.73 ml/m  AORTIC VALVE AV Area (Vmax): 2.73 cm AV Vmax:        100.00 cm/s AV Peak Grad:   4.0 mmHg LVOT Vmax:      86.80 cm/s LVOT Vmean:     64.900 cm/s LVOT VTI:       0.166 m  AORTA Ao Root diam: 2.80 cm MITRAL VALVE MV Area (PHT): 6.07 cm    SHUNTS MV Decel Time: 125 msec    Systemic VTI:  0.17 m MV E velocity: 41.00 cm/s  Systemic Diam: 2.00 cm MV A velocity: 95.20 cm/s MV E/A ratio:  0.43 Landscape architect signed by Phineas Inches Signature Date/Time: 03/24/2022/3:17:46 PM    Final      Assessment and Plan:   Chest tightness with acute COPD exacerbation with acute dyspnea  and elevated troponin - no acute EKG changes. Most likely demand ischemia with acute exacerbation.  Minimal CAD in 2017 and neg stress Echo in 2019  continue ASA statin, BB and ACE  difficult to decide if chest tightness is from COPD or combination of cardiac and pul.  Possible nuc study prior to discharge, will defer to Dr. Harl Bowie  he does have a follow up appt 7/28 with Dr. Stanford Breed.  Hx of acute takotsubo in 2017 with return of EF to normal after 2 days.  Echo in 2019 at [place] with normal EF. Now EF 40-45% Acute COPD exacerbation per IM HLD on statin continue,  Last lipids 12/2020 with LDL 43 will repeat Hx lung cancer with LUL lobectomy and followed as outpt by pulmonology in [facility]. HTN controlled on lisinopril 40 and  toprol XL 12.5   Risk Assessment/Risk Scores:     HEAR Score (for undifferentiated chest pain):  HEAR Score: 6          For questions or updates, please contact [facility] Please consult [email or web address] for contact info under    Signed, Cecilie Kicks, NP  03/25/2022 10:06 AM

## 2022-03-25 NOTE — TOC CM/SW Note (Signed)
  Transition of Care [facility]) Screening Note   Patient Details  Name: Jartavious Mckimmy Date of Birth: 1945-11-03   Transition of Care [facility]) CM/SW Contact:    Ross Ludwig, LCSW Phone Number: 03/25/2022, 4:34 PM    Transition of Care Department [facility]) has reviewed patient and no TOC needs have been identified at this time. We will continue to monitor patient advancement through interdisciplinary progression rounds. If new patient transition needs arise, please place a TOC consult.

## 2022-03-26 ENCOUNTER — Inpatient Hospital Stay (HOSPITAL_COMMUNITY): Payer: Medicare Other

## 2022-03-26 DIAGNOSIS — R778 Other specified abnormalities of plasma proteins: Secondary | ICD-10-CM | POA: Diagnosis not present

## 2022-03-26 DIAGNOSIS — J441 Chronic obstructive pulmonary disease with (acute) exacerbation: Secondary | ICD-10-CM

## 2022-03-26 LAB — CBC WITH DIFFERENTIAL/PLATELET
Abs Immature Granulocytes: 0.04 10*3/uL (ref 0.00–0.07)
Basophils Absolute: 0 10*3/uL (ref 0.0–0.1)
Basophils Relative: 0 %
Eosinophils Absolute: 0 10*3/uL (ref 0.0–0.5)
Eosinophils Relative: 0 %
HCT: 39.8 % (ref 39.0–52.0)
Hemoglobin: 12.9 g/dL — ABNORMAL LOW (ref 13.0–17.0)
Immature Granulocytes: 0 %
Lymphocytes Relative: 6 %
Lymphs Abs: 0.7 10*3/uL (ref 0.7–4.0)
MCH: 31.2 pg (ref 26.0–34.0)
MCHC: 32.4 g/dL (ref 30.0–36.0)
MCV: 96.1 fL (ref 80.0–100.0)
Monocytes Absolute: 0.3 10*3/uL (ref 0.1–1.0)
Monocytes Relative: 3 %
Neutro Abs: 10.2 10*3/uL — ABNORMAL HIGH (ref 1.7–7.7)
Neutrophils Relative %: 91 %
Platelets: 252 10*3/uL (ref 150–400)
RBC: 4.14 MIL/uL — ABNORMAL LOW (ref 4.22–5.81)
RDW: 12.9 % (ref 11.5–15.5)
WBC: 11.2 10*3/uL — ABNORMAL HIGH (ref 4.0–10.5)
nRBC: 0 % (ref 0.0–0.2)

## 2022-03-26 LAB — COMPREHENSIVE METABOLIC PANEL
ALT: 18 U/L (ref 0–44)
AST: 20 U/L (ref 15–41)
Albumin: 3.5 g/dL (ref 3.5–5.0)
Alkaline Phosphatase: 64 U/L (ref 38–126)
Anion gap: 7 (ref 5–15)
BUN: 28 mg/dL — ABNORMAL HIGH (ref 8–23)
CO2: 28 mmol/L (ref 22–32)
Calcium: 9 mg/dL (ref 8.9–10.3)
Chloride: 105 mmol/L (ref 98–111)
Creatinine, Ser: 1.37 mg/dL — ABNORMAL HIGH (ref 0.61–1.24)
GFR, Estimated: 54 mL/min — ABNORMAL LOW (ref >60)
Glucose, Bld: 155 mg/dL — ABNORMAL HIGH (ref 70–99)
Potassium: 4.6 mmol/L (ref 3.5–5.1)
Sodium: 140 mmol/L (ref 135–145)
Total Bilirubin: 0.5 mg/dL (ref 0.3–1.2)
Total Protein: 6 g/dL — ABNORMAL LOW (ref 6.5–8.1)

## 2022-03-26 LAB — MAGNESIUM: Magnesium: 2.2 mg/dL (ref 1.7–2.4)

## 2022-03-26 LAB — PHOSPHORUS: Phosphorus: 3.5 mg/dL (ref 2.5–4.6)

## 2022-03-26 MED ORDER — BUDESONIDE 0.5 MG/2ML IN SUSP
0.5000 mg | Freq: Two times a day (BID) | RESPIRATORY_TRACT | Status: DC
  Administered 2022-03-26 – 2022-03-27 (×2): 0.5 mg via RESPIRATORY_TRACT
  Filled 2022-03-26 (×3): qty 2

## 2022-03-26 MED ORDER — ARFORMOTEROL TARTRATE 15 MCG/2ML IN NEBU
15.0000 ug | INHALATION_SOLUTION | Freq: Two times a day (BID) | RESPIRATORY_TRACT | Status: DC
  Administered 2022-03-26 – 2022-03-27 (×2): 15 ug via RESPIRATORY_TRACT
  Filled 2022-03-26 (×3): qty 2

## 2022-03-26 MED ORDER — FLUTICASONE FUROATE-VILANTEROL 200-25 MCG/ACT IN AEPB
1.0000 | INHALATION_SPRAY | Freq: Every day | RESPIRATORY_TRACT | Status: DC
  Filled 2022-03-26: qty 28

## 2022-03-26 MED ORDER — UMECLIDINIUM BROMIDE 62.5 MCG/ACT IN AEPB
1.0000 | INHALATION_SPRAY | Freq: Every day | RESPIRATORY_TRACT | Status: DC
  Filled 2022-03-26: qty 7

## 2022-03-26 NOTE — Progress Notes (Signed)
PROGRESS NOTE    Gary Wright  SEG:[id number] DOB: 1945/10/29 DOA: 03/24/2022 PCP: Gaye Alken, PA-C   Brief Narrative:  The patient is a 76 year old overweight Caucasian male with past medical history significant for but limited to CAD, COPD, history of elevated troponin, history of lung cancer status post left upper lobe lobectomy, hyperlipidemia, hypertension, prolonged QTc, history of stress-induced cardiomyopathy as well as other comorbidities who presented to the ED department with sudden onset of shortness of breath associated wheezing, chest pressure-like sensation associate with palpitations and diaphoresis while he was sitting working on possible and did not get relief with nebulized treatments at home.  EMS was called and they given 2 DuoNebs in route and he has frequent rhinorrhea but denies any fevers, chills, sweats or hemoptysis.  Occasionally gets orthopneic but denies any PND or pitting edema to lower extremities.  Upon arrival to ED his respiratory status showed that he had a respirations of 18 and is tachycardic with a pulse of 113.  He received another DuoNeb and received methylprednisolone 1.5 mg in the ED and then mag sulfate.  Given his elevated troponin cardiology was consulted for further evaluation recommendations.  He underwent echocardiogram     Assessment and Plan:  Acute Respiratory Failure in the setting of Acute Exacerbation of COPD -Placed in observation in the PCU and then changed to inpatient -Given IV Solu-Medrol 125 mg in the ED and then was placed on p.o. prednisone but given his continued wheeze will change to Solu-Medrol 60 mg every 12 for now and then changed to prednisone 40 once tomorrow if he continues to improve and taper over 2 weeks per pulmonary -He is on scheduled DuoNeb 3 mL 3 times daily and then has as needed albuterol nebs 2.5 mg neb every 4 as needed for wheezing or shortness of breath and will be placed on Brovana and budesonide -Added  guaifenesin 1200 mg p.o. twice daily, flutter valve, incentive spirometry -Per pulmonary continue budesonide, Brovana and DuoNeb combination hospital and resume Trelegy at discharge -Repeat chest x-ray in a.m. -SpO2: 94 % O2 Flow Rate (L/min): 2 L/min; No longer on -Check respiratory virus panel was negative -WBC went from 15.6 -> 12.0 -> 11.2 -Continuous pulse oximetry maintain O2 saturation greater than 90% -Continue supplemental oxygen via nasal cannula and wean O2 to room air -Patient will need an ambulatory home O2 screen prior to discharge as well as a repeat chest x-ray -Since he is slow to improve we consulted pulmonary and they are recommending continuing current treatment regimen and recommend following up with Dr. Verdie Mosher in [facility] -The patient is slowly improving and will do an ambulatory home O2 screen and repeat chest x-ray in the a.m.   Depression and Anxiety -Continue with bupropion 300 g p.o. daily as well as escitalopram for the last year daily -Follow-up with PCP and psychiatry in outpatient setting   Essential Hypertension -Continuing lisinopril 40 mils p.o. daily as well as metoprolol 12.5 mg p.o. twice daily -Continue monitor blood pressures per protocol -Last blood pressure reading was 127/74   Elevated troponin associated with chest tightness in the setting of COPD exacerbation acute dyspnea -Had no acute EKG changes and this is most likely demand ischemia with acute exacerbation -He had a cath previously had minimal CAD in 2017 and a negative stress echo in 2019 -Cardiology recommends continuing aspirin, statin, beta-blocker and ACE -Troponin value went from 261 is now 240; BNP was 80.6 -They are recommending outpatient nuclear Lexiscan stress test once  he is over his acute pulmonary illness -Echocardiogram was done and he has had a history of Takotsubo with nonobstructive left heart cath and STE in a cath estimated EF of 25 to 30% by TTE few days later  showed an EF of 50 to 55% with no apical ballooning -Repeat echo here shows the wall motion is changed to assess and he has some inferior/inferior septal/inferolateral wall to be hypokinetic at the base and apex and EF of 40 to 45% and the left ventricular diastolic parameters were indeterminate -Cardiology still recommends outpatient Lexiscan Myoview and they feel that patient has no concern for acute CHF exacerbation   History of lung cancer with left upper lobe lobectomy -Follows with pulmonary in [place]   Hyperlipidemia -Continue with Atorvastatin 40 g p.o. daily -Cardiology is repeating a lipid panel here   Hypocalcemia -Patient's Ca2+ has gone from 8.7 -> 8.8 -> 9.0 -Continue monitor and trend and repeat CMP in a.m.   Hyperglycemia -Likely to worsen in the setting of Steroid Demargination -Blood sugars ranging from 130-155 on daily BMP/CMP -Check hemoglobin A1c in a.m. and continue monitor blood sugars carefully and if necessary will place on sensitive NovoLog/scale insulin AC  Renal Insufficiency -Unclear what his baseline is but his BUNs/creatinine went from 17/1.24 -> 21/1.20 -> 28/1.37 -Does not really meet criteria for AKI -Avoid further nephrotoxic medications, contrast dyes, hypotension and dehydration to adequately ensure renal perfusion renally dose medications  DVT prophylaxis: enoxaparin (LOVENOX) injection 40 mg Start: 03/24/22 1800    Code Status: Full Code Family Communication: No family present at bedside  Disposition Plan:  Level of care: Progressive Status is: Inpatient Remains inpatient appropriate because: It is improving slowly but still not back to his baseline so anticipate another day of hospitalization and likely discharge in the next 24 to 48 hours   Consultants:  Pulmonary  Procedures:  None  Antimicrobials:  Anti-infectives (From admission, onward)    None       Subjective: Seen and examined at bedside and states that he still  wheezing but not as dyspneic today.  States he is able to lay flatter which he could not do when he came in.  Denies any chest pain but is having cough still.  No other concerns or points this time.  Wanting to speak with the pulmonologist.  Objective: Vitals:   03/25/22 2107 03/26/22 0512 03/26/22 0829 03/26/22 1335  BP: 119/74 136/84  127/74  Pulse: 91 81  95  Resp: 20 20    Temp: 98 F (36.7 C) 97.7 F (36.5 C)    TempSrc: Oral Oral    SpO2: 95% 95% 96%   Weight:      Height:        Intake/Output Summary (Last 24 hours) at 03/26/2022 1339 Last data filed at 03/25/2022 2352 Gross per 24 hour  Intake 600 ml  Output --  Net 600 ml   Filed Weights   03/24/22 1118 03/24/22 1525  Weight: 84.4 kg 81 kg   Examination: Physical Exam:  Constitutional: WN/WD elderly Caucasian male currently no acute distress appears calm Respiratory: Diminished to auscultation bilaterally with coarse breath sounds and has some expiratory wheezing no, no wheezing, rales, rhonchi or crackles. Normal respiratory effort and patient is not tachypenic.  No longer wearing supplemental oxygen via nasal cannula Cardiovascular: RRR, no murmurs / rubs / gallops. S1 and S2 auscultated.   Abdomen: Soft, non-tender, distended secondary body habitus bowel sounds positive.  GU: Deferred. Musculoskeletal: No  clubbing / cyanosis of digits/nails. No joint deformity upper and lower extremities.  Neurologic: CN 2-12 grossly intact with no focal deficits. Romberg sign and cerebellar reflexes not assessed.  Psychiatric: Normal judgment and insight. Alert and oriented x 3. Normal mood and appropriate affect.   Data Reviewed: I have personally reviewed following labs and imaging studies  CBC: Recent Labs  Lab 03/24/22 1119 03/25/22 0349 03/26/22 0407  WBC 15.6* 12.0* 11.2*  NEUTROABS 13.4*  --  10.2*  HGB 13.5 12.5* 12.9*  HCT 41.2 38.6* 39.8  MCV 95.8 95.5 96.1  PLT 260 255 256   Basic Metabolic Panel: Recent  Labs  Lab 03/24/22 1119 03/24/22 1328 03/25/22 0349 03/26/22 0407  NA 140  --  138 140  K 4.1  --  3.9 4.6  CL 107  --  103 105  CO2 26  --  27 28  GLUCOSE 131*  --  130* 155*  BUN 17  --  21 28*  CREATININE 1.24  --  1.20 1.37*  CALCIUM 8.7*  --  8.8* 9.0  MG  --   --   --  2.2  PHOS  --  2.4*  --  3.5   GFR: Estimated Creatinine Clearance: 46.6 mL/min (A) (by C-G formula based on SCr of 1.37 mg/dL (H)). Liver Function Tests: Recent Labs  Lab 03/24/22 1119 03/25/22 0349 03/26/22 0407  AST 20 20 20   ALT 19 17 18   ALKPHOS 64 59 64  BILITOT 0.6 0.5 0.5  PROT 6.7 6.4* 6.0*  ALBUMIN 4.0 3.6 3.5   No results for input(s): LIPASE, AMYLASE in the last 168 hours. No results for input(s): AMMONIA in the last 168 hours. Coagulation Profile: No results for input(s): INR, PROTIME in the last 168 hours. Cardiac Enzymes: No results for input(s): CKTOTAL, CKMB, CKMBINDEX, TROPONINI in the last 168 hours. BNP (last 3 results) No results for input(s): PROBNP in the last 8760 hours. HbA1C: No results for input(s): HGBA1C in the last 72 hours. CBG: No results for input(s): GLUCAP in the last 168 hours. Lipid Profile: No results for input(s): CHOL, HDL, LDLCALC, TRIG, CHOLHDL, LDLDIRECT in the last 72 hours. Thyroid Function Tests: No results for input(s): TSH, T4TOTAL, FREET4, T3FREE, THYROIDAB in the last 72 hours. Anemia Panel: No results for input(s): VITAMINB12, FOLATE, FERRITIN, TIBC, IRON, RETICCTPCT in the last 72 hours. Sepsis Labs: No results for input(s): PROCALCITON, LATICACIDVEN in the last 168 hours.  Recent Results (from the past 240 hour(s))  SARS Coronavirus 2 by RT PCR (hospital order, performed in [facility] hospital lab) *cepheid single result test* Anterior Nasal Swab     Status: None   Collection Time: 03/24/22 11:44 AM   Specimen: Anterior Nasal Swab  Result Value Ref Range Status   SARS Coronavirus 2 by RT PCR NEGATIVE NEGATIVE Final    Comment:  (NOTE) SARS-CoV-2 target nucleic acids are NOT DETECTED.  The SARS-CoV-2 RNA is generally detectable in upper and lower respiratory specimens during the acute phase of infection. The lowest concentration of SARS-CoV-2 viral copies this assay can detect is 250 copies / mL. A negative result does not preclude SARS-CoV-2 infection and should not be used as the sole basis for treatment or other patient management decisions.  A negative result may occur with improper specimen collection / handling, submission of specimen other than nasopharyngeal swab, presence of viral mutation(s) within the areas targeted by this assay, and inadequate number of viral copies (<250 copies / mL). A negative result  must be combined with clinical observations, patient history, and epidemiological information.  Fact Sheet for Patients:   https://www.patel.info/  Fact Sheet for Healthcare Providers: https://hall.com/  This test is not yet approved or  cleared by the [place] FDA and has been authorized for detection and/or diagnosis of SARS-CoV-2 by FDA under an Emergency Use Authorization (EUA).  This EUA will remain in effect (meaning this test can be used) for the duration of the COVID-19 declaration under Section 564(b)(1) of the Act, 21 U.S.C. section 360bbb-3(b)(1), unless the authorization is terminated or revoked sooner.  Performed at [facility], [place] [address] [place], [place] [address]   Respiratory (~20 pathogens) panel by PCR     Status: None   Collection Time: 03/25/22  4:31 PM   Specimen: Nasopharyngeal Swab; Respiratory  Result Value Ref Range Status   Adenovirus NOT DETECTED NOT DETECTED Final   Coronavirus 229E NOT DETECTED NOT DETECTED Final    Comment: (NOTE) The Coronavirus on the Respiratory Panel, DOES NOT test for the novel  Coronavirus (2019 nCoV)    Coronavirus HKU1 NOT DETECTED NOT DETECTED Final    Coronavirus NL63 NOT DETECTED NOT DETECTED Final   Coronavirus OC43 NOT DETECTED NOT DETECTED Final   Metapneumovirus NOT DETECTED NOT DETECTED Final   Rhinovirus / Enterovirus NOT DETECTED NOT DETECTED Final   Influenza A NOT DETECTED NOT DETECTED Final   Influenza B NOT DETECTED NOT DETECTED Final   Parainfluenza Virus 1 NOT DETECTED NOT DETECTED Final   Parainfluenza Virus 2 NOT DETECTED NOT DETECTED Final   Parainfluenza Virus 3 NOT DETECTED NOT DETECTED Final   Parainfluenza Virus 4 NOT DETECTED NOT DETECTED Final   Respiratory Syncytial Virus NOT DETECTED NOT DETECTED Final   Bordetella pertussis NOT DETECTED NOT DETECTED Final   Bordetella Parapertussis NOT DETECTED NOT DETECTED Final   Chlamydophila pneumoniae NOT DETECTED NOT DETECTED Final   Mycoplasma pneumoniae NOT DETECTED NOT DETECTED Final    Comment: Performed at [facility], [place]. [address], [place], [place] [address]    Radiology Studies: DG CHEST PORT 1 VIEW  Result Date: 03/26/2022 CLINICAL DATA:  Shortness of breath. EXAM: PORTABLE CHEST 1 VIEW COMPARISON:  March 24, 2022. FINDINGS: No consolidation. No visible pleural effusions or pneumothorax. Cardiomediastinal silhouette is within normal limits. Severe left greater than right shoulder degenerative change, partially imaged. IMPRESSION: No evidence of acute cardiopulmonary disease. Electronically Signed   By: Margaretha Sheffield M.D.   On: 03/26/2022 08:34   ECHOCARDIOGRAM COMPLETE  Result Date: 03/24/2022    ECHOCARDIOGRAM REPORT   Patient Name:   Gary Wright Date of Exam: 03/24/2022 Medical Rec #:  [id number]      Height:       69.0 in Accession #:    [id number]     Weight:       186.0 lb Date of Birth:  [birth year]     BSA:          2.003 m Patient Age:    65 years       BP:           115/85 mmHg Patient Gender: M              HR:           96 bpm. Exam Location:  Inpatient Procedure: 2D Echo, Cardiac Doppler, Color Doppler and Intracardiac            Opacification  Agent Indications:    Elevated troponin  History:        Patient has prior history of Echocardiogram examinations. CAD,                 COPD; Risk Factors:Hypertension.  Sonographer:    Jyl Heinz Referring Phys: [id number] DAVID MANUEL ORTIZ  Sonographer Comments: Technically difficult study due to poor echo windows. IMPRESSIONS  1. Wall motion is challenging to asses. inferior/inferoseptal/inferolateral wall appears hypokinetic base-apex. Left ventricular ejection fraction, by estimation, is 40 to 45%. The left ventricle has mildly decreased function. Left ventricular diastolic  parameters are indeterminate.  2. Right ventricular systolic function is normal. The right ventricular size is normal.  3. The mitral valve is normal in structure. No evidence of mitral valve regurgitation.  4. There is mild calcification of the aortic valve. Aortic valve regurgitation is not visualized.  5. The inferior vena cava is normal in size with greater than 50% respiratory variability, suggesting right atrial pressure of 3 mmHg. Conclusion(s)/Recommendation(s): Endocardial borders are challenging, does appear to have possible inferior/apical WMA. FINDINGS  Left Ventricle: Wall motion is challenging to asses. inferior/inferoseptal/inferolateral wall appears hypokinetic base-apex. Left ventricular ejection fraction, by estimation, is 40 to 45%. The left ventricle has mildly decreased function. Definity contrast agent was given IV to delineate the left ventricular endocardial borders. The left ventricular internal cavity size was normal in size. There is no left ventricular hypertrophy. Left ventricular diastolic parameters are indeterminate. Right Ventricle: The right ventricular size is normal. Right ventricular systolic function is normal. Left Atrium: Left atrial size was normal in size. Right Atrium: Right atrial size was normal in size. Pericardium: There is no evidence of pericardial effusion. Mitral Valve: The mitral valve is  normal in structure. No evidence of mitral valve regurgitation. Tricuspid Valve: Tricuspid valve regurgitation is not demonstrated. Aortic Valve: There is mild calcification of the aortic valve. Aortic valve regurgitation is not visualized. Aortic valve peak gradient measures 4.0 mmHg. Pulmonic Valve: Pulmonic valve regurgitation is not visualized. Venous: The inferior vena cava is normal in size with greater than 50% respiratory variability, suggesting right atrial pressure of 3 mmHg.  LEFT VENTRICLE PLAX 2D LVIDd:         4.70 cm      Diastology LVIDs:         3.00 cm      LV e' medial:    3.48 cm/s LV PW:         1.00 cm      LV E/e' medial:  11.8 LV IVS:        1.00 cm      LV e' lateral:   3.70 cm/s LVOT diam:     2.00 cm      LV E/e' lateral: 11.1 LV SV:         52 LV SV Index:   26 LVOT Area:     3.14 cm  LV Volumes (MOD) LV vol d, MOD A2C: 119.0 ml LV vol d, MOD A4C: 132.0 ml LV vol s, MOD A2C: 64.1 ml LV vol s, MOD A4C: 68.9 ml LV SV MOD A2C:     54.9 ml LV SV MOD A4C:     132.0 ml LV SV MOD BP:      60.1 ml RIGHT VENTRICLE             IVC RV Basal diam:  3.30 cm     IVC diam: 1.20 cm RV Mid diam:    2.90 cm RV S prime:  12.10 cm/s TAPSE (M-mode): 1.7 cm LEFT ATRIUM             Index        RIGHT ATRIUM           Index LA diam:        3.60 cm 1.80 cm/m   RA Area:     11.90 cm LA Vol (A2C):   30.9 ml 15.42 ml/m  RA Volume:   29.10 ml  14.53 ml/m LA Vol (A4C):   27.7 ml 13.83 ml/m LA Biplane Vol: 29.5 ml 14.73 ml/m  AORTIC VALVE AV Area (Vmax): 2.73 cm AV Vmax:        100.00 cm/s AV Peak Grad:   4.0 mmHg LVOT Vmax:      86.80 cm/s LVOT Vmean:     64.900 cm/s LVOT VTI:       0.166 m  AORTA Ao Root diam: 2.80 cm MITRAL VALVE MV Area (PHT): 6.07 cm    SHUNTS MV Decel Time: 125 msec    Systemic VTI:  0.17 m MV E velocity: 41.00 cm/s  Systemic Diam: 2.00 cm MV A velocity: 95.20 cm/s MV E/A ratio:  0.43 Landscape architect signed by Phineas Inches Signature Date/Time: 03/24/2022/3:17:46 PM    Final       Scheduled Meds:  arformoterol  15 mcg Nebulization BID   aspirin EC  81 mg Oral Daily   atorvastatin  40 mg Oral q1800   budesonide (PULMICORT) nebulizer solution  0.5 mg Nebulization BID   buPROPion  300 mg Oral q morning   citalopram  40 mg Oral q morning   enoxaparin (LOVENOX) injection  40 mg Subcutaneous Q24H   feeding supplement  237 mL Oral BID BM   guaiFENesin  1,200 mg Oral BID   ipratropium-albuterol  3 mL Nebulization TID   lisinopril  40 mg Oral Q1200   mouth rinse  15 mL Mouth Rinse BID   methylPREDNISolone (SOLU-MEDROL) injection  125 mg Intravenous Daily   metoprolol succinate  12.5 mg Oral Q1200   Continuous Infusions:   LOS: 1 day   Raiford Noble, DO [facility] Available via Epic secure chat 7am-7pm After these hours, please refer to coverage provider listed on [email or web address] 03/26/2022, 1:39 PM

## 2022-03-26 NOTE — Progress Notes (Signed)
Initial Nutrition Assessment  DOCUMENTATION CODES:   Not applicable  INTERVENTION:  - continue Ensure Plus High Protein BID, each supplement provides 350 kcal and 20 grams of protein. - complete NFPE when feasible.    NUTRITION DIAGNOSIS:   Increased nutrient needs related to acute illness as evidenced by estimated needs.  GOAL:   Patient will meet greater than or equal to 90% of their needs  MONITOR:   PO intake, Supplement acceptance, Labs, Weight trends  REASON FOR ASSESSMENT:   Malnutrition Screening Tool  ASSESSMENT:   76 year old male with medical history of CAD, COPD, lung cancer s/p LUL lobectomy in 2020, HLD, HTN, prolonged QTc, stress-induced cardiomyopathy, and depression. He presented to the ED due to sudden onset of shortness of breath and wheezing, chest pressure, palpitations, and diaphoresis. He was admitted for acute dyspnea.  Unable to see patient at time of attempted visit. He has not been seen by a [place] RD at any time in the past.   Review of flow sheet indicates that he ate 50% of lunch and 100% of dinner yesterday (total of 916 kcal and 38 grams protein). Ensure ordered BID and he has accepted all 3 bottles offered so far.   Weight on 6/4 was 178 lb and PTA the most recently documented weight was 196 lb on 01/18/21. This indicates 18 lb weight loss (9.2% body weight) in the past 15 months.   MD note from today indicates possible d/c in 24-48 hours.   Labs reviewed; BUN: 28 mg/dl, creatinine: 1.37 mg/dl, GFR: 54 ml/min.  Medications reviewed; 125 mg solu-medrol/day.    NUTRITION - FOCUSED PHYSICAL EXAM:  Unable.  Diet Order:   Diet Order             Diet Heart Room service appropriate? Yes; Fluid consistency: Thin  Diet effective now                   EDUCATION NEEDS:   No education needs have been identified at this time  Skin:  Skin Assessment: Reviewed RN Assessment  Last BM:  PTA/unknown  Height:   Ht Readings from  Last 1 Encounters:  03/24/22 5\' 9"  (1.753 m)    Weight:   Wt Readings from Last 1 Encounters:  03/24/22 81 kg     BMI:  Body mass index is 26.37 kg/m.  Estimated Nutritional Needs:  Kcal:  1785-1925 kcal Protein:  90-100 grams Fluid:  >/= 1.8 L/day      Jarome Matin, MS, RD, LDN Registered Dietitian II Inpatient Clinical Nutrition RD pager # and on-call/weekend pager # available in [facility]

## 2022-03-26 NOTE — Evaluation (Signed)
Occupational Therapy Evaluation Patient Details Name: Gary Wright MRN: [id number] DOB: [birth year] Today's Date: 03/26/2022   History of Present Illness Patient is a 76 year old male who was found to have acute respiratory failure, acute exacerbation of COPD, depression and anxiety, essential HTN, elevated troponin with chest tightness. PMH:CAD, COPD, history of elevated troponin, history of lung cancer status post left upper lobe lobectomy, hyperlipidemia, hypertension, prolonged QTc, history of stress-induced cardiomyopathy,Takotsubo cardiomyopathy   Clinical Impression   Patient evaluated by Occupational Therapy with no further acute OT needs identified. All education has been completed and the patient has no further questions. Patient is MI for ADL tasks with no AD at this time.  See below for any follow-up Occupational Therapy or equipment needs. OT is signing off. Thank you for this referral.       Recommendations for follow up therapy are one component of a multi-disciplinary discharge planning process, led by the attending physician.  Recommendations may be updated based on patient status, additional functional criteria and insurance authorization.   Follow Up Recommendations  No OT follow up    Assistance Recommended at Discharge None  Patient can return home with the following      Functional Status Assessment  Patient has not had a recent decline in their functional status  Equipment Recommendations       Recommendations for Other Services       Precautions / Restrictions Precautions Precaution Comments: monitor vitals Restrictions Weight Bearing Restrictions: No      Mobility Bed Mobility Overal bed mobility: Modified Independent                  Transfers                          Balance Overall balance assessment: No apparent balance deficits (not formally assessed)                                         ADL either  performed or assessed with clinical judgement   ADL Overall ADL's : Modified independent                                       General ADL Comments: patient is able to complete LB dressing, toileting, functional mobility inside room and in hallway while maintianing O2 over 95% on RA. patient was educated on ECT, deep breathing and pulse ox patient verbalized and demonstrated understanding.     Vision Baseline Vision/History: 1 Wears glasses Patient Visual Report: No change from baseline       Perception     Praxis      Pertinent Vitals/Pain Pain Assessment Pain Assessment: No/denies pain     Hand Dominance Right   Extremity/Trunk Assessment     Lower Extremity Assessment Lower Extremity Assessment: Overall WFL for tasks assessed   Cervical / Trunk Assessment Cervical / Trunk Assessment: Kyphotic   Communication Communication Communication: No difficulties   Cognition Arousal/Alertness: Awake/alert Behavior During Therapy: WFL for tasks assessed/performed Overall Cognitive Status: Within Functional Limits for tasks assessed  General Comments       Exercises     Shoulder Instructions      Home Living Family/patient expects to be discharged to:: Private residence Living Arrangements: Spouse/significant other Available Help at Discharge: Family;Available 24 hours/day Type of Home: House Home Access: Stairs to enter [employer] of Steps: 4 Entrance Stairs-Rails: Can reach both Home Layout: One level     Bathroom Shower/Tub: Tub/shower unit;Walk-in shower   Bathroom Toilet: Standard     Home Equipment: Cane - single point          Prior Functioning/Environment Prior Level of Function : Independent/Modified Independent               ADLs Comments: has a cane for community use.        OT Problem List:        OT Treatment/Interventions:      OT Goals(Current  goals can be found in the care plan section) Acute Rehab OT Goals OT Goal Formulation: All assessment and education complete, DC therapy  OT Frequency:      Co-evaluation              AM-PAC OT "6 Clicks" Daily Activity     Outcome Measure Help from another person eating meals?: None Help from another person taking care of personal grooming?: None Help from another person toileting, which includes using toliet, bedpan, or urinal?: None Help from another person bathing (including washing, rinsing, drying)?: None Help from another person to put on and taking off regular upper body clothing?: None Help from another person to put on and taking off regular lower body clothing?: None 6 Click Score: 24   End of Session Nurse Communication: Mobility status  Activity Tolerance: Patient tolerated treatment well Patient left: in chair;with call bell/phone within reach  OT Visit Diagnosis: Unsteadiness on feet (R26.81)                Time: 1433-1500 OT Time Calculation (min): 27 min Charges:  OT General Charges $OT Visit: 1 Visit OT Evaluation $OT Eval Low Complexity: 1 Low OT Treatments $Self Care/Home Management : 8-22 mins  Gary Wright OTR/L, MS Acute Rehabilitation Department Office# [phone] Pager# [phone]   Marcellina Millin 03/26/2022, 3:52 PM

## 2022-03-26 NOTE — Consult Note (Signed)
NAME:  Gary Wright, MRN:  [id number], DOB:  1946/06/10, LOS: 1 ADMISSION DATE:  03/24/2022, CONSULTATION DATE:  03/26/22 REFERRING MD:  Alfredia Ferguson - TRH, CHIEF COMPLAINT:  SOB    History of Present Illness:  76 yo M PMH COPD, lung cancer s/p LUL lobectomy in 2020, CAD, NSTEMI, takotsubo cardiomyopathy who came to ED 6/4-- he was working on a puzzle, had sudden onset SOB palpitations and diaphoresis that did not improve with BD. Admitted to [facility] for AECOPD. Started on steroids, nebs, mucinex. Seen by cards for mild trop elevation 6/5 and possible ACS sx-- felt related to AECOPD.   PCCM is consulted 6/6 due to slow improvement.   Pertinent  Medical History  COPD -follows with Dr. Verdie [facility]her at [facility] , on oxygen since 2022 after hospitalization for COPD flare, uses as needed  Lung cancer s/p LUL lobectomy 10/2018 CAD Qt prolongation   Significant Hospital Events: Including procedures, antibiotic start and stop dates in addition to other pertinent events   6/4 admit to [facility]. Started on steroids, mag, nebs. COVId Flu neg  6/5 cards consult for trop leak -- demand due to AECOPD. RVP neg  6/6 PCCM consult for slow-to-improve AECOPD   Interim History / Subjective:  Feels mildly improved, able to walk to bathroom and back  Objective   Blood pressure 136/84, pulse 81, temperature 97.7 F (36.5 C), temperature source Oral, resp. rate 20, height 5\' 9"  (1.753 m), weight 81 kg, SpO2 96 %.        Intake/Output Summary (Last 24 hours) at 03/26/2022 1015 Last data filed at 03/25/2022 2352 Gross per 24 hour  Intake 600 ml  Output --  Net 600 ml   Filed Weights   03/24/22 1118 03/24/22 1525  Weight: 84.4 kg 81 kg    Examination: General: Elderly man, no distress able to ambulate slowly HENT: No pallor, icterus, no JVD Lungs: Faint expiratory rhonchi scattered, no accessory muscle use, decreased breath sounds bilateral Cardiovascular: S1-S2 regular, no murmur Abdomen: Soft, nontender , no  hepatosplenomegaly Extremities: No edema Neuro: Alert, interactive, nonfocal, no asterixis   Resolved Hospital Problem list     Assessment & Plan:    AECOPD -- improving , does not appear to be infective -Improved hypoxia  -CXR 6/6  not suggestive of PNA or pulm edema P -cont Solu-Medrol daily, can switch to oral prednisone 40 mg tomorrow if continues to improve and taper over 2 weeks -Continue budesonide Brovana and DuoNeb combination while in the hospital and can resume Trelegy on discharge, I explained rationale for triple therapy -cont PRN albuterol neb  -mobility, IS    Elevated troponin History of Takotsubo cardiomyopathy in 2017 RBBB -Seen by cardiology, advise Lexiscan Myoview as outpatient -Tolerating low-dose metoprolol   He has follow-up with pulmonology Dr. Verdie Mosher in [facility] He has been undergoing surveillance CT by cardiothoracic surgery PCCM available as needed   Best Practice (right click and "Reselect all SmartList Selections" daily)   Per primary   Labs   CBC: Recent Labs  Lab 03/24/22 1119 03/25/22 0349 03/26/22 0407  WBC 15.6* 12.0* 11.2*  NEUTROABS 13.4*  --  10.2*  HGB 13.5 12.5* 12.9*  HCT 41.2 38.6* 39.8  MCV 95.8 95.5 96.1  PLT 260 255 875    Basic Metabolic Panel: Recent Labs  Lab 03/24/22 1119 03/24/22 1328 03/25/22 0349 03/26/22 0407  NA 140  --  138 140  K 4.1  --  3.9 4.6  CL 107  --  103 105  CO2 26  --  27 28  GLUCOSE 131*  --  130* 155*  BUN 17  --  21 28*  CREATININE 1.24  --  1.20 1.37*  CALCIUM 8.7*  --  8.8* 9.0  MG  --   --   --  2.2  PHOS  --  2.4*  --  3.5   GFR: Estimated Creatinine Clearance: 46.6 mL/min (A) (by C-G formula based on SCr of 1.37 mg/dL (H)). Recent Labs  Lab 03/24/22 1119 03/25/22 0349 03/26/22 0407  WBC 15.6* 12.0* 11.2*    Liver Function Tests: Recent Labs  Lab 03/24/22 1119 03/25/22 0349 03/26/22 0407  AST 20 20 20   ALT 19 17 18   ALKPHOS 64 59 64  BILITOT 0.6 0.5 0.5   PROT 6.7 6.4* 6.0*  ALBUMIN 4.0 3.6 3.5   No results for input(s): LIPASE, AMYLASE in the last 168 hours. No results for input(s): AMMONIA in the last 168 hours.  ABG No results found for: PHART, PCO2ART, PO2ART, HCO3, TCO2, ACIDBASEDEF, O2SAT   Coagulation Profile: No results for input(s): INR, PROTIME in the last 168 hours.  Cardiac Enzymes: No results for input(s): CKTOTAL, CKMB, CKMBINDEX, TROPONINI in the last 168 hours.  HbA1C: No results found for: HGBA1C  CBG: No results for input(s): GLUCAP in the last 168 hours.  Review of Systems:   Shortness of breath and wheezing Dry cough   Constitutional: negative for anorexia, fevers and sweats  Eyes: negative for irritation, redness and visual disturbance  Ears, nose, mouth, throat, and face: negative for earaches, epistaxis, nasal congestion and sore throat  Cardiovascular: negative for chest pain,  lower extremity edema, orthopnea, palpitations and syncope  Gastrointestinal: negative for abdominal pain, constipation, diarrhea, melena, nausea and vomiting  Genitourinary:negative for dysuria, frequency and hematuria  Hematologic/lymphatic: negative for bleeding, easy bruising and lymphadenopathy  Musculoskeletal:negative for arthralgias, muscle weakness and stiff joints  Neurological: negative for coordination problems, gait problems, headaches and weakness  Endocrine: negative for diabetic symptoms including polydipsia, polyuria and weight loss   Past Medical History:  He,  has a past medical history of CAD (coronary artery disease) of artery bypass graft (06/13/2016), COPD (chronic obstructive pulmonary disease) ([place]), Depression (09/01/2007), Elevated troponin (06/13/2016), History of lung cancer (11/02/2020), Hyperlipidemia (11/02/2020), Hypertension, Prolonged QT interval (11/02/2020), and Stress-induced cardiomyopathy (06/13/2016).   Surgical History:   Past Surgical History:  Procedure Laterality Date   BACK SURGERY      fusion in [place] N/A 06/13/2016   Procedure: Left Heart Cath and Coronary Angiography;  Surgeon: Nelva Bush, MD;  Location: [facility];  Service: Cardiovascular;  Laterality: N/A;   INGUINAL HERNIA REPAIR Right 04/21/2018   Procedure: RIGHT INGUINAL HERNIA REPAIR ERAS PATHWAY;  Surgeon: Erroll Luna, MD;  Location: [facility];  Service: General;  Laterality: Right;  TAP BLOCK   INSERTION OF MESH Right 04/21/2018   Procedure: INSERTION OF MESH;  Surgeon: Erroll Luna, MD;  Location: [facility];  Service: General;  Laterality: Right;   TONSILLECTOMY       Social History:   reports that he quit smoking about 4 years ago. His smoking use included cigarettes. He smoked an average of .5 packs per day. He has never used smokeless tobacco. He reports that he does not drink alcohol and does not use drugs.   Family History:  His family history includes Hypertension in his father and mother.   Allergies No Known Allergies   Home  Medications  Prior to Admission medications   Medication Sig Start Date End Date Taking? Authorizing Provider  albuterol (PROVENTIL) (2.5 MG/3ML) 0.083% nebulizer solution Take 2.5 mg by nebulization 3 (three) times daily as needed for wheezing or shortness of breath. 02/26/22  Yes [provider]  albuterol (VENTOLIN HFA) 108 (90 Base) MCG/ACT inhaler Inhale 2 puffs into the lungs every 6 (six) hours as needed for wheezing or shortness of breath. 11/04/20  Yes Barb Merino, MD  aspirin 81 MG chewable tablet CHEW AND SWALLOW 1 TABLET(81 MG) BY MOUTH DAILY Patient taking differently: Chew 81 mg by mouth daily at 12 noon. 07/22/16  Yes Lelon Perla, MD  atorvastatin (LIPITOR) 40 MG tablet TAKE 1 TABLET(40 MG) BY MOUTH DAILY AT 6 PM Patient taking differently: Take 40 mg by mouth daily at 6 PM. 05/08/21  Yes Stanford Breed, Denice Bors, MD  buPROPion (WELLBUTRIN XL) 300 MG 24 hr tablet Take 300 mg by mouth every morning. 05/11/16  Yes [provider]  citalopram (CELEXA) 40 MG tablet Take 40 mg by mouth every morning. 06/01/16  Yes [provider]  Fluticasone-Umeclidin-Vilant (TRELEGY ELLIPTA) 100-62.5-25 MCG/INH AEPB Inhale 1 puff into the lungs daily. Patient taking differently: Inhale 1 puff into the lungs every evening. 11/04/20  Yes Ghimire, Dante Gang, MD  lisinopril (ZESTRIL) 40 MG tablet TAKE 1 TABLET(40 MG) BY MOUTH DAILY. Schedule an appointment for further refils, 1st attempt Patient taking differently: Take 40 mg by mouth daily at 12 noon. . Schedule an appointment for further refils, 1st attempt 02/05/22  Yes Crenshaw, Denice Bors, MD  metoprolol succinate (TOPROL-XL) 25 MG 24 hr tablet TAKE 1/2 TABLET(12.5 MG) BY MOUTH DAILY Patient taking differently: Take 12.5 mg by mouth daily at 12 noon. 11/06/21  Yes Crenshaw, Denice Bors, MD     [name] Mead MD. FCCP. [facility] Pager : [phone]  If no response to pager , please call [phone] until 7 pm After 7:00 pm call Elink  [phone]   03/26/2022

## 2022-03-27 ENCOUNTER — Inpatient Hospital Stay (HOSPITAL_COMMUNITY): Payer: Medicare Other

## 2022-03-27 LAB — CBC WITH DIFFERENTIAL/PLATELET
Abs Immature Granulocytes: 0.12 10*3/uL — ABNORMAL HIGH (ref 0.00–0.07)
Basophils Absolute: 0 10*3/uL (ref 0.0–0.1)
Basophils Relative: 0 %
Eosinophils Absolute: 0 10*3/uL (ref 0.0–0.5)
Eosinophils Relative: 0 %
HCT: 41.7 % (ref 39.0–52.0)
Hemoglobin: 13.6 g/dL (ref 13.0–17.0)
Immature Granulocytes: 1 %
Lymphocytes Relative: 4 %
Lymphs Abs: 0.7 10*3/uL (ref 0.7–4.0)
MCH: 31.1 pg (ref 26.0–34.0)
MCHC: 32.6 g/dL (ref 30.0–36.0)
MCV: 95.4 fL (ref 80.0–100.0)
Monocytes Absolute: 1.2 10*3/uL — ABNORMAL HIGH (ref 0.1–1.0)
Monocytes Relative: 7 %
Neutro Abs: 16.7 10*3/uL — ABNORMAL HIGH (ref 1.7–7.7)
Neutrophils Relative %: 88 %
Platelets: 273 10*3/uL (ref 150–400)
RBC: 4.37 MIL/uL (ref 4.22–5.81)
RDW: 13.1 % (ref 11.5–15.5)
WBC: 18.8 10*3/uL — ABNORMAL HIGH (ref 4.0–10.5)
nRBC: 0 % (ref 0.0–0.2)

## 2022-03-27 LAB — COMPREHENSIVE METABOLIC PANEL
ALT: 19 U/L (ref 0–44)
AST: 17 U/L (ref 15–41)
Albumin: 3.4 g/dL — ABNORMAL LOW (ref 3.5–5.0)
Alkaline Phosphatase: 72 U/L (ref 38–126)
Anion gap: 7 (ref 5–15)
BUN: 31 mg/dL — ABNORMAL HIGH (ref 8–23)
CO2: 29 mmol/L (ref 22–32)
Calcium: 8.9 mg/dL (ref 8.9–10.3)
Chloride: 104 mmol/L (ref 98–111)
Creatinine, Ser: 1.16 mg/dL (ref 0.61–1.24)
GFR, Estimated: >60 mL/min (ref >60)
Glucose, Bld: 110 mg/dL — ABNORMAL HIGH (ref 70–99)
Potassium: 4 mmol/L (ref 3.5–5.1)
Sodium: 140 mmol/L (ref 135–145)
Total Bilirubin: 0.5 mg/dL (ref 0.3–1.2)
Total Protein: 5.9 g/dL — ABNORMAL LOW (ref 6.5–8.1)

## 2022-03-27 LAB — PHOSPHORUS: Phosphorus: 3.5 mg/dL (ref 2.5–4.6)

## 2022-03-27 LAB — MAGNESIUM: Magnesium: 2 mg/dL (ref 1.7–2.4)

## 2022-03-27 MED ORDER — PREDNISONE 10 MG PO TABS: ORAL_TABLET | ORAL | 0 refills | Status: AC

## 2022-03-27 NOTE — TOC Progression Note (Signed)
Transition of Care [facility]) - Progression Note    Patient Details  Name: Gary Wright MRN: [id number] Date of Birth: Jul 05, 1946  [facility]) CM/SW Contact  Leeroy Cha, RN Phone Number: 03/27/2022, 7:33 AM  Clinical Narrative:    Following for toc and dc needs.   Expected Discharge Plan: Home/Self Care Barriers to Discharge: Continued Medical Work up  Expected Discharge Plan and Services Expected Discharge Plan: Home/Self Care   Discharge Planning Services: CM Consult   Living arrangements for the past 2 months: Single Family Home                                       Social Determinants of Health (SDOH) Interventions    Readmission Risk Interventions     View : No data to display.

## 2022-03-27 NOTE — TOC Transition Note (Signed)
Transition of Care [facility]) - CM/SW Discharge Note   Patient Details  Name: Gary Wright MRN: [id number] Date of Birth: [birth year]  Transition of Care [facility]) CM/SW Contact:  Leeroy Cha, RN Phone Number: 03/27/2022, 9:59 AM   Clinical Narrative:    Patient dcd to home with no toc needs. Orders checked.     Barriers to Discharge: Continued Medical Work up   Patient Goals and CMS Choice Patient states their goals for this hospitalization and ongoing recovery are:: to go back home CMS Medicare.gov Compare Post Acute Care list provided to:: Patient    Discharge Placement                       Discharge Plan and Services   Discharge Planning Services: CM Consult                                 Social Determinants of Health (SDOH) Interventions     Readmission Risk Interventions     View : No data to display.

## 2022-03-27 NOTE — Plan of Care (Signed)
  Problem: Education: Goal: Knowledge of disease or condition will improve Outcome: Progressing   Problem: Activity: Goal: Ability to tolerate increased activity will improve Outcome: Progressing Goal: Will verbalize the importance of balancing activity with adequate rest periods Outcome: Progressing   Problem: Respiratory: Goal: Ability to maintain a clear airway will improve Outcome: Progressing Goal: Levels of oxygenation will improve Outcome: Progressing Goal: Ability to maintain adequate ventilation will improve Outcome: Progressing

## 2022-03-27 NOTE — Progress Notes (Signed)
Nursing Discharge Note   Admit Date: 03/24/2022  Discharge date: 03/27/2022   Gary Wright is to be discharged home per MD order.  AVS completed. Reviewed with patient and family at bedside. Highlighted copy provided for patient to take home.  Patient/caregiver able to verbalize understanding of discharge instructions. PIV removed. Patient stable upon discharge.    Allergies as of 03/27/2022   No Known Allergies      Medication List     TAKE these medications    albuterol 108 (90 Base) MCG/ACT inhaler Commonly known as: VENTOLIN HFA Inhale 2 puffs into the lungs every 6 (six) hours as needed for wheezing or shortness of breath.   albuterol (2.5 MG/3ML) 0.083% nebulizer solution Commonly known as: PROVENTIL Take 2.5 mg by nebulization 3 (three) times daily as needed for wheezing or shortness of breath.   aspirin 81 MG chewable tablet CHEW AND SWALLOW 1 TABLET(81 MG) BY MOUTH DAILY What changed: See the new instructions.   atorvastatin 40 MG tablet Commonly known as: LIPITOR TAKE 1 TABLET(40 MG) BY MOUTH DAILY AT 6 PM What changed: See the new instructions.   buPROPion 300 MG 24 hr tablet Commonly known as: WELLBUTRIN XL Take 300 mg by mouth every morning.   citalopram 40 MG tablet Commonly known as: CELEXA Take 40 mg by mouth every morning.   lisinopril 40 MG tablet Commonly known as: ZESTRIL TAKE 1 TABLET(40 MG) BY MOUTH DAILY. Schedule an appointment for further refils, 1st attempt What changed:  how much to take how to take this when to take this additional instructions   metoprolol succinate 25 MG 24 hr tablet Commonly known as: TOPROL-XL TAKE 1/2 TABLET(12.5 MG) BY MOUTH DAILY What changed: See the new instructions.   predniSONE 10 MG tablet Commonly known as: DELTASONE Take 4 tablets (40 mg total) by mouth daily for 4 days, THEN 3 tablets (30 mg total) daily for 4 days, THEN 2 tablets (20 mg total) daily for 3 days, THEN 1 tablet (10 mg total) daily for 3  days. Start taking on: March 27, 2022   Trelegy Ellipta 100-62.5-25 MCG/ACT Aepb Generic drug: Fluticasone-Umeclidin-Vilant Inhale 1 puff into the lungs daily. What changed: when to take this        Discharge Instructions/ Education: Discharge instructions given to patient/family with verbalized understanding. Discharge education completed with patient/family including: follow up instructions, medication list, discharge activities, and limitations if indicated. Patient instructed to return to Emergency Department, call 911, or call MD for any changes in condition.  Patient escorted via wheelchair to lobby and discharged home via private automobile.

## 2022-03-27 NOTE — Discharge Summary (Signed)
Physician Discharge Summary  [name] UXL:[id number] DOB: October 23, 1945 DOA: 03/24/2022  PCP: Gaye Alken, PA-C  Admit date: 03/24/2022 Discharge date: 03/27/2022  Admitted From: home Disposition:  home  Recommendations for Outpatient Follow-up:  Follow up with primary pulmonologist at [facility]: None Equipment/Devices: None  Discharge Condition: Stable CODE STATUS: Full code Diet Orders (From admission, onward)     Start     Ordered   03/24/22 1259  Diet Heart Room service appropriate? Yes; Fluid consistency: Thin  Diet effective now       Question Answer Comment  Room service appropriate? Yes   Fluid consistency: Thin      03/24/22 1300           HPI: Per admitting MD, Gary Wright is a 76 y.o. male with medical history significant of CAD, COPD, depression, elevated troponin, history of lung cancer, hyperlipidemia, hypertension, prolonged QT interval, stress-induced cardiomyopathy who is coming to the emergency department due to sudden onset of shortness of breath associated with wheezing, chest pressure-like sensation, palpitations with diaphoresis while he was sitting working on a puzzle that did not get relief with a nebulizer treatment at home.  EMS was called.  They gave 2 DuoNebs in route. He has frequent rhinorrhea, but denied fever, chills, sore throat or hemoptysis.  He gets occasional orthopnea, but no PND or pitting edema of the lower extremities.  No abdominal pain, nausea, emesis, diarrhea, constipation, melena or hematochezia.  No flank pain, dysuria, frequency or hematuria.  No polyuria, polydipsia, polyphagia or blurred vision.   Hospital Course / Discharge diagnoses: Principal Problem:   Acute dyspnea Active Problems:   Depression   Essential hypertension   Elevated troponin   Chest pain   CAD (coronary artery disease) of artery bypass graft   COPD exacerbation (HCC)   Hyperlipidemia   Hypocalcemia   Hyperglycemia   Acute  exacerbation of chronic obstructive pulmonary disease (COPD) ([place])  Principal problem Acute on chronic hypoxic respiratory failure due to acute COPD exacerbation-patient was admitted to the hospital and pulmonology was consulted.  He was placed on nebulizers, steroids, with significant improvement in his respiratory status.  He feels better, back to baseline, able to ambulate without significant difficulties and will be discharged home in stable condition.  He was advised to follow-up with his primary pulmonologist in [facility].  He was placed on a prednisone taper for the next 2 weeks.  Active problems Essential hypertension-continue home medications Elevated troponin, chest tightness-this is likely demand in the setting of #1.  Cardiology evaluated patient, recommending to continue aspirin, statin, beta-blocker and ACE inhibitor.  They recommend outpatient nuclear Lexiscan stress test once his acute pulmonary issues resolved. Echocardiogram was done and he has had a history of Takotsubo with nonobstructive left heart cath and STE in a cath estimated EF of 25 to 30% by TTE few days later showed an EF of 50 to 55% with no apical ballooning. Repeat echo here shows the wall motion is changed to assess and he has some inferior/inferior septal/inferolateral wall to be hypokinetic at the base and apex and EF of 40 to 45% and the left ventricular diastolic parameters were indeterminate History of lung cancer-status post left upper lobe lobectomy 3 years ago. Tobacco use-in remission, quit 3 years ago Hyperlipidemia-continue statin Leukocytosis-due to steroids Chronic systolic [id number] echo as above.  Appears euvolemic  Sepsis ruled out   Discharge Instructions   Allergies as of 03/27/2022   No Known Allergies  Medication List     TAKE these medications    albuterol 108 (90 Base) MCG/ACT inhaler Commonly known as: VENTOLIN HFA Inhale 2 puffs into the lungs every 6 (six) hours as needed for  wheezing or shortness of breath.   albuterol (2.5 MG/3ML) 0.083% nebulizer solution Commonly known as: PROVENTIL Take 2.5 mg by nebulization 3 (three) times daily as needed for wheezing or shortness of breath.   aspirin 81 MG chewable tablet CHEW AND SWALLOW 1 TABLET(81 MG) BY MOUTH DAILY What changed: See the new instructions.   atorvastatin 40 MG tablet Commonly known as: LIPITOR TAKE 1 TABLET(40 MG) BY MOUTH DAILY AT 6 PM What changed: See the new instructions.   buPROPion 300 MG 24 hr tablet Commonly known as: WELLBUTRIN XL Take 300 mg by mouth every morning.   citalopram 40 MG tablet Commonly known as: CELEXA Take 40 mg by mouth every morning.   lisinopril 40 MG tablet Commonly known as: ZESTRIL TAKE 1 TABLET(40 MG) BY MOUTH DAILY. Schedule an appointment for further refils, 1st attempt What changed:  how much to take how to take this when to take this additional instructions   metoprolol succinate 25 MG 24 hr tablet Commonly known as: TOPROL-XL TAKE 1/2 TABLET(12.5 MG) BY MOUTH DAILY What changed: See the new instructions.   predniSONE 10 MG tablet Commonly known as: DELTASONE Take 4 tablets (40 mg total) by mouth daily for 4 days, THEN 3 tablets (30 mg total) daily for 4 days, THEN 2 tablets (20 mg total) daily for 3 days, THEN 1 tablet (10 mg total) daily for 3 days. Start taking on: March 27, 2022   Trelegy Ellipta 100-62.5-25 MCG/ACT Aepb Generic drug: Fluticasone-Umeclidin-Vilant Inhale 1 puff into the lungs daily. What changed: when to take this        Follow-up Information     Almyra Deforest, [place] Follow up on 04/11/2022.   Specialties: Cardiology, Radiology Why: at 8:00 AM with Dr. Jacalyn Lefevre PA Contact information: [address] [place] [address] [phone]                 Consultations: Cardiology  Pulmonary   Procedures/Studies:  DG CHEST PORT 1 VIEW  Result Date: 03/27/2022 CLINICAL DATA:  Shortness of breath  EXAM: PORTABLE CHEST 1 VIEW COMPARISON:  March 26, 2022 FINDINGS: The heart size and mediastinal contours are within normal limits. Both lungs are clear. Lungs are hyperinflated. The visualized skeletal structures are stable. IMPRESSION: No active cardiopulmonary disease. Emphysema. Electronically Signed   By: Abelardo Diesel M.D.   On: 03/27/2022 07:34   DG CHEST PORT 1 VIEW  Result Date: 03/26/2022 CLINICAL DATA:  Shortness of breath. EXAM: PORTABLE CHEST 1 VIEW COMPARISON:  March 24, 2022. FINDINGS: No consolidation. No visible pleural effusions or pneumothorax. Cardiomediastinal silhouette is within normal limits. Severe left greater than right shoulder degenerative change, partially imaged. IMPRESSION: No evidence of acute cardiopulmonary disease. Electronically Signed   By: Margaretha Sheffield M.D.   On: 03/26/2022 08:34   DG Chest Portable 1 View  Result Date: 03/24/2022 CLINICAL DATA:  Acute shortness of breath EXAM: PORTABLE CHEST 1 VIEW COMPARISON:  11/02/2020 and prior studies FINDINGS: The cardiomediastinal silhouette is unremarkable. LEFT lobectomy changes again noted. Emphysema again noted. There is no evidence of focal airspace disease, pulmonary edema, suspicious pulmonary nodule/mass, pleural effusion, or pneumothorax. No acute bony abnormalities are identified. IMPRESSION: Emphysema without evidence of acute cardiopulmonary disease. Electronically Signed   By: Cleatis Polka.D.  On: 03/24/2022 11:34   ECHOCARDIOGRAM COMPLETE  Result Date: 03/24/2022    ECHOCARDIOGRAM REPORT   Patient Name:   Gary Wright Date of Exam: 03/24/2022 Medical Rec #:  [id number]      Height:       69.0 in Accession #:    [id number]     Weight:       186.0 lb Date of Birth:  1946/05/12     BSA:          2.003 m Patient Age:    76 years       BP:           115/85 mmHg Patient Gender: M              HR:           96 bpm. Exam Location:  Inpatient Procedure: 2D Echo, Cardiac Doppler, Color Doppler and Intracardiac             Opacification Agent Indications:    Elevated troponin  History:        Patient has prior history of Echocardiogram examinations. CAD,                 COPD; Risk Factors:Hypertension.  Sonographer:    Jyl Heinz Referring Phys: [id number] DAVID MANUEL ORTIZ  Sonographer Comments: Technically difficult study due to poor echo windows. IMPRESSIONS  1. Wall motion is challenging to asses. inferior/inferoseptal/inferolateral wall appears hypokinetic base-apex. Left ventricular ejection fraction, by estimation, is 40 to 45%. The left ventricle has mildly decreased function. Left ventricular diastolic  parameters are indeterminate.  2. Right ventricular systolic function is normal. The right ventricular size is normal.  3. The mitral valve is normal in structure. No evidence of mitral valve regurgitation.  4. There is mild calcification of the aortic valve. Aortic valve regurgitation is not visualized.  5. The inferior vena cava is normal in size with greater than 50% respiratory variability, suggesting right atrial pressure of 3 mmHg. Conclusion(s)/Recommendation(s): Endocardial borders are challenging, does appear to have possible inferior/apical WMA. FINDINGS  Left Ventricle: Wall motion is challenging to asses. inferior/inferoseptal/inferolateral wall appears hypokinetic base-apex. Left ventricular ejection fraction, by estimation, is 40 to 45%. The left ventricle has mildly decreased function. Definity contrast agent was given IV to delineate the left ventricular endocardial borders. The left ventricular internal cavity size was normal in size. There is no left ventricular hypertrophy. Left ventricular diastolic parameters are indeterminate. Right Ventricle: The right ventricular size is normal. Right ventricular systolic function is normal. Left Atrium: Left atrial size was normal in size. Right Atrium: Right atrial size was normal in size. Pericardium: There is no evidence of pericardial effusion. Mitral Valve: The  mitral valve is normal in structure. No evidence of mitral valve regurgitation. Tricuspid Valve: Tricuspid valve regurgitation is not demonstrated. Aortic Valve: There is mild calcification of the aortic valve. Aortic valve regurgitation is not visualized. Aortic valve peak gradient measures 4.0 mmHg. Pulmonic Valve: Pulmonic valve regurgitation is not visualized. Venous: The inferior vena cava is normal in size with greater than 50% respiratory variability, suggesting right atrial pressure of 3 mmHg.  LEFT VENTRICLE PLAX 2D LVIDd:         4.70 cm      Diastology LVIDs:         3.00 cm      LV e' medial:    3.48 cm/s LV PW:         1.00 cm  LV E/e' medial:  11.8 LV IVS:        1.00 cm      LV e' lateral:   3.70 cm/s LVOT diam:     2.00 cm      LV E/e' lateral: 11.1 LV SV:         52 LV SV Index:   26 LVOT Area:     3.14 cm  LV Volumes (MOD) LV vol d, MOD A2C: 119.0 ml LV vol d, MOD A4C: 132.0 ml LV vol s, MOD A2C: 64.1 ml LV vol s, MOD A4C: 68.9 ml LV SV MOD A2C:     54.9 ml LV SV MOD A4C:     132.0 ml LV SV MOD BP:      60.1 ml RIGHT VENTRICLE             IVC RV Basal diam:  3.30 cm     IVC diam: 1.20 cm RV Mid diam:    2.90 cm RV S prime:     12.10 cm/s TAPSE (M-mode): 1.7 cm LEFT ATRIUM             Index        RIGHT ATRIUM           Index LA diam:        3.60 cm 1.80 cm/m   RA Area:     11.90 cm LA Vol (A2C):   30.9 ml 15.42 ml/m  RA Volume:   29.10 ml  14.53 ml/m LA Vol (A4C):   27.7 ml 13.83 ml/m LA Biplane Vol: 29.5 ml 14.73 ml/m  AORTIC VALVE AV Area (Vmax): 2.73 cm AV Vmax:        100.00 cm/s AV Peak Grad:   4.0 mmHg LVOT Vmax:      86.80 cm/s LVOT Vmean:     64.900 cm/s LVOT VTI:       0.166 m  AORTA Ao Root diam: 2.80 cm MITRAL VALVE MV Area (PHT): 6.07 cm    SHUNTS MV Decel Time: 125 msec    Systemic VTI:  0.17 m MV E velocity: 41.00 cm/s  Systemic Diam: 2.00 cm MV A velocity: 95.20 cm/s MV E/A ratio:  0.43 Landscape architect signed by Phineas Inches Signature Date/Time:  03/24/2022/3:17:46 PM    Final      Subjective: - no chest pain, shortness of breath, no abdominal pain, nausea or vomiting.   Discharge Exam: BP 117/71 (BP Location: Right Arm)   Pulse 80   Temp 98 F (36.7 C) (Oral)   Resp 20   Ht 5\' 9"  (1.753 m)   Wt 81 kg   SpO2 97%   BMI 26.37 kg/m   General: Pt is alert, awake, not in acute distress Cardiovascular: RRR, S1/S2 +, no rubs, no gallops Respiratory:  distant faint end expiratory wheezing, no rhonchi Abdominal: Soft, NT, ND, bowel sounds + Extremities: no edema, no cyanosis    The results of significant diagnostics from this hospitalization (including imaging, microbiology, ancillary and laboratory) are listed below for reference.     Microbiology: Recent Results (from the past 240 hour(s))  SARS Coronavirus 2 by RT PCR (hospital order, performed in [facility] hospital lab) *cepheid single result test* Anterior Nasal Swab     Status: None   Collection Time: 03/24/22 11:44 AM   Specimen: Anterior Nasal Swab  Result Value Ref Range Status   SARS Coronavirus 2 by RT PCR NEGATIVE NEGATIVE Final    Comment: (NOTE) SARS-CoV-2  target nucleic acids are NOT DETECTED.  The SARS-CoV-2 RNA is generally detectable in upper and lower respiratory specimens during the acute phase of infection. The lowest concentration of SARS-CoV-2 viral copies this assay can detect is 250 copies / mL. A negative result does not preclude SARS-CoV-2 infection and should not be used as the sole basis for treatment or other patient management decisions.  A negative result may occur with improper specimen collection / handling, submission of specimen other than nasopharyngeal swab, presence of viral mutation(s) within the areas targeted by this assay, and inadequate number of viral copies (<250 copies / mL). A negative result must be combined with clinical observations, patient history, and epidemiological information.  Fact Sheet for Patients:    https://www.patel.info/  Fact Sheet for Healthcare Providers: https://hall.com/  This test is not yet approved or  cleared by the [place] FDA and has been authorized for detection and/or diagnosis of SARS-CoV-2 by FDA under an Emergency Use Authorization (EUA).  This EUA will remain in effect (meaning this test can be used) for the duration of the COVID-19 declaration under Section 564(b)(1) of the Act, 21 U.S.C. section 360bbb-3(b)(1), unless the authorization is terminated or revoked sooner.  Performed at [facility], [place] [address] [place], [place] [address]   Respiratory (~20 pathogens) panel by PCR     Status: None   Collection Time: 03/25/22  4:31 PM   Specimen: Nasopharyngeal Swab; Respiratory  Result Value Ref Range Status   Adenovirus NOT DETECTED NOT DETECTED Final   Coronavirus 229E NOT DETECTED NOT DETECTED Final    Comment: (NOTE) The Coronavirus on the Respiratory Panel, DOES NOT test for the novel  Coronavirus (2019 nCoV)    Coronavirus HKU1 NOT DETECTED NOT DETECTED Final   Coronavirus NL63 NOT DETECTED NOT DETECTED Final   Coronavirus OC43 NOT DETECTED NOT DETECTED Final   Metapneumovirus NOT DETECTED NOT DETECTED Final   Rhinovirus / Enterovirus NOT DETECTED NOT DETECTED Final   Influenza A NOT DETECTED NOT DETECTED Final   Influenza B NOT DETECTED NOT DETECTED Final   Parainfluenza Virus 1 NOT DETECTED NOT DETECTED Final   Parainfluenza Virus 2 NOT DETECTED NOT DETECTED Final   Parainfluenza Virus 3 NOT DETECTED NOT DETECTED Final   Parainfluenza Virus 4 NOT DETECTED NOT DETECTED Final   Respiratory Syncytial Virus NOT DETECTED NOT DETECTED Final   Bordetella pertussis NOT DETECTED NOT DETECTED Final   Bordetella Parapertussis NOT DETECTED NOT DETECTED Final   Chlamydophila pneumoniae NOT DETECTED NOT DETECTED Final   Mycoplasma pneumoniae NOT DETECTED NOT DETECTED Final    Comment:  Performed at [facility], [place]. [address] [place],  [address]     Labs: Basic Metabolic Panel: Recent Labs  Lab 03/24/22 1119 03/24/22 1328 03/25/22 0349 03/26/22 0407 03/27/22 0331  NA 140  --  138 140 140  K 4.1  --  3.9 4.6 4.0  CL 107  --  103 105 104  CO2 26  --  27 28 29   GLUCOSE 131*  --  130* 155* 110*  BUN 17  --  21 28* 31*  CREATININE 1.24  --  1.20 1.37* 1.16  CALCIUM 8.7*  --  8.8* 9.0 8.9  MG  --   --   --  2.2 2.0  PHOS  --  2.4*  --  3.5 3.5   Liver Function Tests: Recent Labs  Lab 03/24/22 1119 03/25/22 0349 03/26/22 0407 03/27/22 0331  AST 20 20 20  17  ALT 19 17 18 19   ALKPHOS 64 59 64 72  BILITOT 0.6 0.5 0.5 0.5  PROT 6.7 6.4* 6.0* 5.9*  ALBUMIN 4.0 3.6 3.5 3.4*   CBC: Recent Labs  Lab 03/24/22 1119 03/25/22 0349 03/26/22 0407 03/27/22 0331  WBC 15.6* 12.0* 11.2* 18.8*  NEUTROABS 13.4*  --  10.2* 16.7*  HGB 13.5 12.5* 12.9* 13.6  HCT 41.2 38.6* 39.8 41.7  MCV 95.8 95.5 96.1 95.4  PLT 260 255 252 273   CBG: No results for input(s): GLUCAP in the last 168 hours. Hgb A1c No results for input(s): HGBA1C in the last 72 hours. Lipid Profile No results for input(s): CHOL, HDL, LDLCALC, TRIG, CHOLHDL, LDLDIRECT in the last 72 hours. Thyroid function studies No results for input(s): TSH, T4TOTAL, T3FREE, THYROIDAB in the last 72 hours.  Invalid input(s): FREET3 Urinalysis No results found for: COLORURINE, APPEARANCEUR, LABSPEC, PHURINE, GLUCOSEU, HGBUR, BILIRUBINUR, KETONESUR, PROTEINUR, UROBILINOGEN, NITRITE, LEUKOCYTESUR  FURTHER DISCHARGE INSTRUCTIONS:   Get Medicines reviewed and adjusted: Please take all your medications with you for your next visit with your Primary MD   Laboratory/radiological data: Please request your Primary MD to go over all hospital tests and procedure/radiological results at the follow up, please ask your Primary MD to get all Hospital records sent to his/her office.   In some cases, they will  be blood work, cultures and biopsy results pending at the time of your discharge. Please request that your primary care M.D. goes through all the records of your hospital data and follows up on these results.   Also Note the following: If you experience worsening of your admission symptoms, develop shortness of breath, life threatening emergency, suicidal or homicidal thoughts you must seek medical attention immediately by calling 911 or calling your MD immediately  if symptoms less severe.   You must read complete instructions/literature along with all the possible adverse reactions/side effects for all the Medicines you take and that have been prescribed to you. Take any new Medicines after you have completely understood and accpet all the possible adverse reactions/side effects.    Do not drive when taking Pain medications or sleeping medications (Benzodaizepines)   Do not take more than prescribed Pain, Sleep and Anxiety Medications. It is not advisable to combine anxiety,sleep and pain medications without talking with your primary care practitioner   Special Instructions: If you have smoked or chewed Tobacco  in the last 2 yrs please stop smoking, stop any regular Alcohol  and or any Recreational drug use.   Wear Seat belts while driving.   Please note: You were cared for by a hospitalist during your hospital stay. Once you are discharged, your primary care physician will handle any further medical issues. Please note that NO REFILLS for any discharge medications will be authorized once you are discharged, as it is imperative that you return to your primary care physician (or establish a relationship with a primary care physician if you do not have one) for your post hospital discharge needs so that they can reassess your need for medications and monitor your lab values.  Time coordinating discharge: 40 minutes  SIGNED:  Marzetta Board, MD, PhD 03/27/2022, 8:31 AM

## 2022-03-27 NOTE — Progress Notes (Signed)
PT Cancellation Note  Patient Details Name: Gary Wright MRN: [id number] DOB: 07-24-1946   Cancelled Treatment:    Reason Eval/Treat Not Completed: PT screened, no needs identified, will sign off (pt ambulating on unit at Mod Independent level. No needs per OT. Will sign off at this time.)   [place], DPT Acute Rehabilitation Services Office [phone] Pager [phone]  03/27/22 9:39 AM

## 2022-03-29 ENCOUNTER — Other Ambulatory Visit: Payer: Self-pay | Admitting: Cardiology

## 2022-04-11 ENCOUNTER — Ambulatory Visit: Payer: Medicare Other | Admitting: Physician Assistant

## 2022-04-16 ENCOUNTER — Encounter: Payer: Medicare Other | Admitting: Physician Assistant

## 2022-04-17 NOTE — Progress Notes (Signed)
This encounter was created in error - please disregard.

## 2022-04-21 NOTE — Progress Notes (Unsigned)
Cardiology Clinic Note   Patient Name: Gary Wright Date of Encounter: 04/24/2022  Primary Care Provider:  Gaye Alken, PA-C Primary Cardiologist:  Kirk Ruths, MD  Patient Profile    Gary Wright 76 year old male presents to the clinic today for follow-up evaluation of his coronary artery disease and essential hypertension.  Past Medical History    Past Medical History:  Diagnosis Date   CAD (coronary artery disease) of artery bypass graft 06/13/2016   COPD (chronic obstructive pulmonary disease) ([place])    Depression 09/01/2007   Qualifier: Diagnosis of  By: Danny Lawless CMA, [place]     Elevated troponin 06/13/2016   History of lung cancer 11/02/2020   Hyperlipidemia 11/02/2020   Hypertension    Prolonged QT interval 11/02/2020   Stress-induced cardiomyopathy 06/13/2016   Past Surgical History:  Procedure Laterality Date   BACK SURGERY     fusion in [place] N/A 06/13/2016   Procedure: Left Heart Cath and Coronary Angiography;  Surgeon: Nelva Bush, MD;  Location: [facility];  Service: Cardiovascular;  Laterality: N/A;   INGUINAL HERNIA REPAIR Right 04/21/2018   Procedure: RIGHT INGUINAL HERNIA REPAIR ERAS PATHWAY;  Surgeon: Erroll Luna, MD;  Location: [place];  Service: General;  Laterality: Right;  TAP BLOCK   INSERTION OF MESH Right 04/21/2018   Procedure: INSERTION OF MESH;  Surgeon: Erroll Luna, MD;  Location: [place];  Service: General;  Laterality: Right;   TONSILLECTOMY      Allergies  No Known Allergies  History of Present Illness    Gary Wright has a PMH of coronary artery disease, COPD, depression, history of lung CA, HLD, HTN, prolonged QT interval, and stress-induced cardiomyopathy.  Echocardiogram initially Takotsubo cardiomyopathy showed an LVEF of 25-30%.  His follow-up echocardiogram showed an EF of 50-55% with no apical ballooning.  He presented to the emergency department on 03/24/2022 and was discharged on  03/27/2022.  He presented with sudden onset of shortness of breath which was associated with wheezing and chest pressure.  He also noted palpitations and diaphoresis.  He had been sitting working on a puzzle and did not note relief when he used his home nebulizer.  EMS was contacted.  He was given 2 DuoNebs in route.  He noted rhinorrhea but denied fever chills sore throat and hemoptysis.  He did note occasional orthopnea but denied PND and lower extremity swelling.  He denied abdominal pain, nausea, emesis, diarrhea constipation, melena or hematochezia.  He denied vision changes.  He was diagnosed with acute on chronic hypoxic respiratory failure secondary to acute COPD exacerbation.  Pulmonology was consulted.  He was placed on steroids and nebulizers.  He noted significant improvement in his respiratory status.  He was advised to follow-up with his primary pulm in [facility] and was placed on a 2-week prednisone taper.  His blood pressure was well controlled.  He was felt to have elevated troponins in the setting of demand ischemia.  His cardiac medications were continued.  A outpatient nuclear stress test once his acute pulmonary issues resolved was recommended.  He presents to the clinic today for follow-up evaluation states he is feeling much better since being discharged from the hospital.  He does report occasional episodes of chest discomfort which he describes as pressure.  These episodes come on randomly and last for about 5-10 minutes.  They also go away without intervention.  He feels that it is related to his breathing.  We reviewed his hospitalization and recommendations  for outpatient stress testing.  He expressed understanding.  I will order a fasting lipid and LFTs, nuclear stress test, have him increase his physical activity as tolerated and follow-up as scheduled.  Today he denies chest pain, shortness of breath, lower extremity edema, fatigue, palpitations, melena, hematuria, hemoptysis,  diaphoresis, weakness, presyncope, syncope, orthopnea, and PND.    Home Medications    Prior to Admission medications   Medication Sig Start Date End Date Taking? Authorizing Provider  albuterol (PROVENTIL) (2.5 MG/3ML) 0.083% nebulizer solution Take 2.5 mg by nebulization 3 (three) times daily as needed for wheezing or shortness of breath. 02/26/22   [provider]  albuterol (VENTOLIN HFA) 108 (90 Base) MCG/ACT inhaler Inhale 2 puffs into the lungs every 6 (six) hours as needed for wheezing or shortness of breath. 11/04/20   Barb Merino, MD  aspirin 81 MG chewable tablet CHEW AND SWALLOW 1 TABLET(81 MG) BY MOUTH DAILY Patient taking differently: Chew 81 mg by mouth daily at 12 noon. 07/22/16   Lelon Perla, MD  atorvastatin (LIPITOR) 40 MG tablet TAKE 1 TABLET(40 MG) BY MOUTH DAILY AT 6 PM Patient taking differently: Take 40 mg by mouth daily at 6 PM. 05/08/21   Lelon Perla, MD  buPROPion (WELLBUTRIN XL) 300 MG 24 hr tablet Take 300 mg by mouth every morning. 05/11/16   [provider]  citalopram (CELEXA) 40 MG tablet Take 40 mg by mouth every morning. 06/01/16   [provider]  Fluticasone-Umeclidin-Vilant (TRELEGY ELLIPTA) 100-62.5-25 MCG/INH AEPB Inhale 1 puff into the lungs daily. Patient taking differently: Inhale 1 puff into the lungs every evening. 11/04/20   Barb Merino, MD  lisinopril (ZESTRIL) 40 MG tablet TAKE 1 TABLET(40 MG) BY MOUTH DAILY 03/29/22   Lelon Perla, MD  metoprolol succinate (TOPROL-XL) 25 MG 24 hr tablet TAKE 1/2 TABLET(12.5 MG) BY MOUTH DAILY Patient taking differently: Take 12.5 mg by mouth daily at 12 noon. 11/06/21   Lelon Perla, MD    Family History    Family History  Problem Relation Age of Onset   Hypertension Mother    Hypertension Father    He indicated that his mother is deceased. He indicated that his father is deceased.  Social History    Social History   Socioeconomic History   Marital  status: Legally Separated    Spouse name: Not on file   Number of children: Not on file   Years of education: Not on file   Highest education level: Not on file  Occupational History   Not on file  Tobacco Use   Smoking status: Former    Packs/day: 0.50    Types: Cigarettes    Quit date: 2019    Years since quitting: 4.5   Smokeless tobacco: Never  Vaping Use   Vaping Use: Never used  Substance and Sexual Activity   Alcohol use: No    Comment: No alcohol for 17 years   Drug use: No   Sexual activity: Not on file  Other Topics Concern   Not on file  Social History Narrative   Not on file   Social Determinants of Health   Financial Resource Strain: Not on file  Food Insecurity: Not on file  Transportation Needs: Not on file  Physical Activity: Not on file  Stress: Not on file  Social Connections: Not on file  Intimate Partner Violence: Not on file     Review of Systems    General:  No chills, fever,  night sweats or weight changes.  Cardiovascular:  No chest pain, dyspnea on exertion, edema, orthopnea, palpitations, paroxysmal nocturnal dyspnea. Dermatological: No rash, lesions/masses Respiratory: No cough, dyspnea Urologic: No hematuria, dysuria Abdominal:   No nausea, vomiting, diarrhea, bright red blood per rectum, melena, or hematemesis Neurologic:  No visual changes, wkns, changes in mental status. All other systems reviewed and are otherwise negative except as noted above.  Physical Exam    VS:  BP 120/80 (BP Location: Left Arm)   Pulse 67   Ht 5\' 10"  (1.778 m)   Wt 187 lb 12.8 oz (85.2 kg)   SpO2 98%   BMI 26.95 kg/m  , BMI Body mass index is 26.95 kg/m. GEN: Well nourished, well developed, in no acute distress. HEENT: normal. Neck: Supple, no JVD, carotid bruits, or masses. Cardiac: RRR, no murmurs, rubs, or gallops. No clubbing, cyanosis, edema.  Radials/DP/PT 2+ and equal bilaterally.  Respiratory:  Respirations regular and unlabored, clear to  auscultation bilaterally. GI: Soft, nontender, nondistended, BS + x 4. MS: no deformity or atrophy. Skin: warm and dry, no rash. Neuro:  Strength and sensation are intact. Psych: Normal affect.  Accessory Clinical Findings    Recent Labs: 03/24/2022: B Natriuretic Peptide 80.6 03/27/2022: ALT 19; BUN 31; Creatinine, Ser 1.16; Hemoglobin 13.6; Magnesium 2.0; Platelets 273; Potassium 4.0; Sodium 140   Recent Lipid Panel    Component Value Date/Time   CHOL 117 01/18/2021 1423   TRIG 141 01/18/2021 1423   HDL 50 01/18/2021 1423   CHOLHDL 2.3 01/18/2021 1423   CHOLHDL 2.0 10/03/2016 1327   VLDL 16 10/03/2016 1327   LDLCALC 43 01/18/2021 1423    ECG personally reviewed by me today-normal sinus rhythm left axis deviation right bundle branch block 67 bpm- No acute changes  Echocardiogram 03/24/2022  IMPRESSIONS     1. Wall motion is challenging to asses.  inferior/inferoseptal/inferolateral wall appears hypokinetic base-apex.  Left ventricular ejection fraction, by estimation, is 40 to 45%. The left  ventricle has mildly decreased function. Left ventricular diastolic   parameters are indeterminate.   2. Right ventricular systolic function is normal. The right ventricular  size is normal.   3. The mitral valve is normal in structure. No evidence of mitral valve  regurgitation.   4. There is mild calcification of the aortic valve. Aortic valve  regurgitation is not visualized.   5. The inferior vena cava is normal in size with greater than 50%  respiratory variability, suggesting right atrial pressure of 3 mmHg.   Conclusion(s)/Recommendation(s): Endocardial borders are challenging, does  appear to have possible inferior/apical WMA.  Cardiac catheterization 06/13/2016 Left Ventricle The left ventricular size is normal. There is severe left ventricular systolic dysfunction. The left ventricular ejection fraction is 25-35% by visual estimate. There are LV function abnormalities due to  segmental dysfunction. LV function is severely depressed with dyskinesis of the anterolateral and inferior walls but preserved function of the bases and true apex     Coronary Diagrams  Diagnostic Dominance: Right  Intervention   Assessment & Plan   1.  Elevated troponin, chest discomfort-occasional brief episodes of chest discomfort, nonexertional.  Felt to be demand ischemia in the setting of acute COPD exacerbation.  Patient seen and evaluated outpatient nuclear stress test recommended. Continue metoprolol, aspirin Heart healthy low-sodium diet-salty 6 given Increase physical activity as tolerated Order nuclear stress test  Shared Decision Making/Informed Consent The risks [chest pain, shortness of breath, cardiac arrhythmias, dizziness, blood pressure fluctuations, myocardial infarction, stroke/transient  ischemic attack, nausea, vomiting, allergic reaction, radiation exposure, metallic taste sensation and life-threatening complications (estimated to be 1 in 10,000)], benefits (risk stratification, diagnosing coronary artery disease, treatment guidance) and alternatives of a nuclear stress test were discussed in detail with Gary Wright and he agrees to proceed.   Essential hypertension-BP today 120/80.  Well-controlled at home. Continue metoprolol, lisinopril Heart healthy low-sodium diet-salty 6 given Increase physical activity as tolerated  Coronary artery disease-denies chest discomfort since being discharged from the hospital.  Underwent cardiac catheterization 8/17 which showed mild diffuse nonobstructive CAD. Continue aspirin, atorvastatin, metoprolol, lisinopril Heart healthy low-sodium diet-salty 6 given Increase physical activity as tolerated   Hyperlipidemia-LDL 43 on 01/18/2021 Continue aspirin, atorvastatin Heart healthy low-sodium high-fiber diet.   Increase physical activity as tolerated Repeat fasting lipids and LFTs  Chronic systolic CHF-weight stable.  No  increased DOE or activity intolerance.  Has returned to baseline activities. Continue lisinopril, metoprolol Heart healthy low-sodium diet-salty 6 given Increase physical activity as tolerated  COPD-breathing has returned to baseline.  Patient followed up with pulmonology and reports compliance with medication. Continue albuterol Increase physical activity as tolerated  Disposition: Follow-up with Dr. Stanford Breed or me as scheduled.  Jossie Ng. Cailee Blanke NP-C    04/24/2022, 8:46 AM [address] [place] [address] Office [phone] Fax [phone]  Notice: This dictation was prepared with Dragon dictation along with smaller phrase technology. Any transcriptional errors that result from this process are unintentional and may not be corrected upon review.  I spent 14 minutes examining this patient, reviewing medications, and using patient centered shared decision making involving her cardiac care.  Prior to her visit I spent greater than 20 minutes reviewing her past medical history,  medications, and prior cardiac tests.

## 2022-04-24 ENCOUNTER — Encounter: Payer: Self-pay | Admitting: General Practice

## 2022-04-24 ENCOUNTER — Ambulatory Visit (INDEPENDENT_AMBULATORY_CARE_PROVIDER_SITE_OTHER): Payer: Medicare Other | Admitting: General Practice

## 2022-04-24 VITALS — BP 120/80 | HR 67 | Ht 70.0 in | Wt 187.8 lb

## 2022-04-24 DIAGNOSIS — R778 Other specified abnormalities of plasma proteins: Secondary | ICD-10-CM | POA: Diagnosis not present

## 2022-04-24 DIAGNOSIS — I5022 Chronic systolic (congestive) heart failure: Secondary | ICD-10-CM

## 2022-04-24 DIAGNOSIS — E7800 Pure hypercholesterolemia, unspecified: Secondary | ICD-10-CM

## 2022-04-24 DIAGNOSIS — I10 Essential (primary) hypertension: Secondary | ICD-10-CM | POA: Diagnosis not present

## 2022-04-24 DIAGNOSIS — I2510 Atherosclerotic heart disease of native coronary artery without angina pectoris: Secondary | ICD-10-CM

## 2022-04-24 DIAGNOSIS — J449 Chronic obstructive pulmonary disease, unspecified: Secondary | ICD-10-CM

## 2022-04-24 NOTE — Addendum Note (Signed)
Addended by: Deberah Pelton on: 04/24/2022 09:04 AM   Modules accepted: Orders

## 2022-04-24 NOTE — Addendum Note (Signed)
Addended by: Waylan Rocher on: 04/24/2022 08:53 AM   Modules accepted: Orders

## 2022-04-24 NOTE — Patient Instructions (Signed)
Medication Instructions:  The current medical regimen is effective;  continue present plan and medications as directed. Please refer to the Current Medication list given to you today.  *If you need a refill on your cardiac medications before your next appointment, please call your pharmacy*  Lab Work: LIPID, LFT TODAY If you have labs (blood work) drawn today and your tests are completely normal, you will receive your results only by: [place] (if you have MyChart) OR A paper copy in the mail If you have any lab test that is abnormal or we need to change your treatment, we will call you to review the results.  Testing/Procedures: LEXISCAN=Your physician has requested that you have a [employer], is a chemical stress test. Please follow instruction sheet, as given.  The Myoview Stress Test is a diagnostic test to evaluate/detect the presence of early heart disease, to assess your functional capacity, or to update the status of your coronary circulation following a cardiac event.  Special Instructions PLEASE READ AND FOLLOW SALTY 6-ATTACHED-1,800 mg daily  Follow-Up: Your next appointment:  KEEP SCHEDULED  In Person with Kirk Ruths, MD    At [facility], you and your health needs are our priority.  As part of our continuing mission to provide you with exceptional heart care, we have created designated Provider Care Teams.  These Care Teams include your primary Cardiologist (physician) and Advanced Practice Providers (APPs -  Physician Assistants and Nurse Practitioners) who all work together to provide you with the care you need, when you need it.  Important Information About Sugar

## 2022-04-25 ENCOUNTER — Other Ambulatory Visit: Payer: Self-pay

## 2022-04-25 DIAGNOSIS — E7800 Pure hypercholesterolemia, unspecified: Secondary | ICD-10-CM

## 2022-04-25 DIAGNOSIS — I10 Essential (primary) hypertension: Secondary | ICD-10-CM

## 2022-04-25 LAB — HEPATIC FUNCTION PANEL
ALT: 20 IU/L (ref 0–44)
AST: 12 IU/L (ref 0–40)
Albumin: 4.4 g/dL (ref 3.7–4.7)
Alkaline Phosphatase: 91 IU/L (ref 44–121)
Bilirubin Total: 0.3 mg/dL (ref 0.0–1.2)
Bilirubin, Direct: 0.1 mg/dL (ref 0.00–0.40)
Total Protein: 6.4 g/dL (ref 6.0–8.5)

## 2022-04-25 LAB — LIPID PANEL
Chol/HDL Ratio: 2.4 ratio (ref 0.0–5.0)
Cholesterol, Total: 147 mg/dL (ref 100–199)
HDL: 62 mg/dL (ref >39)
LDL Chol Calc (NIH): 68 mg/dL (ref 0–99)
Triglycerides: 89 mg/dL (ref 0–149)
VLDL Cholesterol Cal: 17 mg/dL (ref 5–40)

## 2022-04-25 MED ORDER — EZETIMIBE 10 MG PO TABS: 10.0000 mg | ORAL_TABLET | Freq: Every day | ORAL | 2 refills | Status: DC

## 2022-04-26 ENCOUNTER — Ambulatory Visit (HOSPITAL_COMMUNITY)
Admission: RE | Admit: 2022-04-26 | Discharge: 2022-04-26 | Disposition: A | Discharge status: Home / Self Care | Payer: Medicare Other | Source: Ambulatory Visit | Attending: Cardiovascular Disease | Admitting: Cardiovascular Disease

## 2022-04-26 DIAGNOSIS — I2510 Atherosclerotic heart disease of native coronary artery without angina pectoris: Secondary | ICD-10-CM

## 2022-04-26 DIAGNOSIS — R778 Other specified abnormalities of plasma proteins: Secondary | ICD-10-CM

## 2022-05-02 ENCOUNTER — Telehealth (HOSPITAL_COMMUNITY): Payer: Self-pay | Admitting: *Deleted

## 2022-05-02 NOTE — Telephone Encounter (Signed)
Close encounter 

## 2022-05-03 ENCOUNTER — Ambulatory Visit (HOSPITAL_COMMUNITY)
Admission: RE | Admit: 2022-05-03 | Payer: Medicare Other | Source: Ambulatory Visit | Attending: General Practice | Admitting: General Practice

## 2022-05-03 ENCOUNTER — Ambulatory Visit (HOSPITAL_COMMUNITY)
Admission: RE | Admit: 2022-05-03 | Discharge: 2022-05-03 | Disposition: A | Discharge status: Home / Self Care | Payer: Medicare Other | Source: Ambulatory Visit | Attending: Radiology | Admitting: Radiology

## 2022-05-03 ENCOUNTER — Other Ambulatory Visit (HOSPITAL_COMMUNITY): Payer: Self-pay | Admitting: General Practice

## 2022-05-03 DIAGNOSIS — I10 Essential (primary) hypertension: Secondary | ICD-10-CM | POA: Diagnosis not present

## 2022-05-03 DIAGNOSIS — I2510 Atherosclerotic heart disease of native coronary artery without angina pectoris: Secondary | ICD-10-CM | POA: Insufficient documentation

## 2022-05-03 DIAGNOSIS — Z87891 Personal history of nicotine dependence: Secondary | ICD-10-CM | POA: Insufficient documentation

## 2022-05-03 DIAGNOSIS — R778 Other specified abnormalities of plasma proteins: Secondary | ICD-10-CM

## 2022-05-03 DIAGNOSIS — Z85118 Personal history of other malignant neoplasm of bronchus and lung: Secondary | ICD-10-CM | POA: Diagnosis not present

## 2022-05-03 DIAGNOSIS — R748 Abnormal levels of other serum enzymes: Secondary | ICD-10-CM

## 2022-05-03 DIAGNOSIS — J449 Chronic obstructive pulmonary disease, unspecified: Secondary | ICD-10-CM | POA: Diagnosis not present

## 2022-05-03 DIAGNOSIS — I252 Old myocardial infarction: Secondary | ICD-10-CM | POA: Insufficient documentation

## 2022-05-03 DIAGNOSIS — I4510 Unspecified right bundle-branch block: Secondary | ICD-10-CM | POA: Diagnosis not present

## 2022-05-03 LAB — MYOCARDIAL PERFUSION IMAGING
LV dias vol: 91 mL (ref 62–150)
LV sys vol: 40 mL
Nuc Stress EF: 57 %
Peak HR: 90 {beats}/min
Rest HR: 63 {beats}/min
Rest Nuclear Isotope Dose: 10.8 mCi
SDS: 8
SRS: 4
SSS: 12
ST Depression (mm): 0 mm
Stress Nuclear Isotope Dose: 32 mCi
TID: 0.95

## 2022-05-03 MED ORDER — REGADENOSON 0.4 MG/5ML IV SOLN
0.4000 mg | Freq: Once | INTRAVENOUS | Status: AC
  Administered 2022-05-03: 0.4 mg via INTRAVENOUS

## 2022-05-03 MED ORDER — TECHNETIUM TC 99M TETROFOSMIN IV KIT
10.8000 | PACK | Freq: Once | INTRAVENOUS | Status: AC | PRN
  Administered 2022-05-03: 10.8 via INTRAVENOUS

## 2022-05-03 MED ORDER — TECHNETIUM TC 99M TETROFOSMIN IV KIT
32.0000 | PACK | Freq: Once | INTRAVENOUS | Status: AC | PRN
  Administered 2022-05-03: 32 via INTRAVENOUS

## 2022-05-07 ENCOUNTER — Telehealth: Payer: Self-pay | Admitting: Cardiology

## 2022-05-07 NOTE — Telephone Encounter (Signed)
Recent labs and myoview results placed in the mail.

## 2022-05-07 NOTE — Telephone Encounter (Signed)
Pt is requesting any labs or stress tests be mailed to him at his currently address on file.

## 2022-05-17 ENCOUNTER — Ambulatory Visit: Payer: Medicare Other | Admitting: Cardiology

## 2022-05-23 ENCOUNTER — Ambulatory Visit (INDEPENDENT_AMBULATORY_CARE_PROVIDER_SITE_OTHER): Payer: Medicare Other | Admitting: Pulmonary Disease

## 2022-05-23 ENCOUNTER — Encounter: Payer: Self-pay | Admitting: Pulmonary Disease

## 2022-05-23 VITALS — BP 122/74 | HR 85 | Ht 70.0 in | Wt 186.0 lb

## 2022-05-23 DIAGNOSIS — J449 Chronic obstructive pulmonary disease, unspecified: Secondary | ICD-10-CM | POA: Diagnosis not present

## 2022-05-23 DIAGNOSIS — Z85118 Personal history of other malignant neoplasm of bronchus and lung: Secondary | ICD-10-CM | POA: Diagnosis not present

## 2022-05-23 DIAGNOSIS — Z87891 Personal history of nicotine dependence: Secondary | ICD-10-CM | POA: Diagnosis not present

## 2022-05-23 DIAGNOSIS — K219 Gastro-esophageal reflux disease without esophagitis: Secondary | ICD-10-CM

## 2022-05-23 MED ORDER — TRELEGY ELLIPTA 100-62.5-25 MCG/ACT IN AEPB: 1.0000 | INHALATION_SPRAY | Freq: Every day | RESPIRATORY_TRACT | 11 refills | Status: DC

## 2022-05-23 MED ORDER — PANTOPRAZOLE SODIUM 40 MG PO TBEC: 40.0000 mg | DELAYED_RELEASE_TABLET | Freq: Every day | ORAL | 11 refills | Status: DC

## 2022-05-23 NOTE — Progress Notes (Signed)
Synopsis: Referred in August 2023 for COPD by Gaye Alken, PA  Subjective:   PATIENT ID: Gary Wright GENDER: male DOB: 07-19-1946, MRN: [id number]  HPI  Chief Complaint  Patient presents with   Consult    Referral for COPD. States he has had COPD for about 10 years. Was seeing a pulmonologist in [facility]. Wants to have an oxygen sent to [facility] to cancel his O2.     Gary Wright is a 76 year old male, former smoker with CAD, hypertension, hyperlipidemia and lung cancer s/p LUL lobectomy 10/30/2018 who is referred to pulmonary clinic for COPD.   He was admitted 6/4 to 6/7 for COPD exacerbation. He is currently on Trelegy ellipta 1 puff daily. He is using albuterol inhaler 1- 2 times per day. He is using albuterol nebulizer treatments 3 times per day.   He has on going dyspnea. He does have some wheezing intermittently. He does have cough with some phlegm, that is light brown in color.   He has oxygen equipment at home and is not using it. He denies issues with seasonal allergies. He does have heart burn issues more so at night. He is sleeping with head of bed elevated.   He is retired. He lives with his wife. He quit smoking in 2019. He smoked 1.5 pack per day for 20 years.    Past Medical History:  Diagnosis Date   CAD (coronary artery disease) of artery bypass graft 06/13/2016   COPD (chronic obstructive pulmonary disease) ([place])    Depression 09/01/2007   Qualifier: Diagnosis of  By: Danny Lawless CMA, [place]     Elevated troponin 06/13/2016   History of lung cancer 11/02/2020   Hyperlipidemia 11/02/2020   Hypertension    Prolonged QT interval 11/02/2020   Stress-induced cardiomyopathy 06/13/2016     Family History  Problem Relation Age of Onset   Hypertension Mother    Hypertension Father      Social History   Socioeconomic History   Marital status: Legally Separated    Spouse name: Not on file   Number of children: Not on file   Years of education: Not on file    Highest education level: Not on file  Occupational History   Not on file  Tobacco Use   Smoking status: Former    Packs/day: 0.50    Types: Cigarettes    Quit date: 2019    Years since quitting: 4.5   Smokeless tobacco: Never  Vaping Use   Vaping Use: Never used  Substance and Sexual Activity   Alcohol use: No    Comment: No alcohol for 17 years   Drug use: No   Sexual activity: Not on file  Other Topics Concern   Not on file  Social History Narrative   Not on file   Social Determinants of Health   Financial Resource Strain: Not on file  Food Insecurity: Not on file  Transportation Needs: Not on file  Physical Activity: Not on file  Stress: Not on file  Social Connections: Not on file  Intimate Partner Violence: Not on file     No Known Allergies   Outpatient Medications Prior to Visit  Medication Sig Dispense Refill   albuterol (PROVENTIL) (2.5 MG/3ML) 0.083% nebulizer solution Take 2.5 mg by nebulization 3 (three) times daily as needed for wheezing or shortness of breath.     albuterol (VENTOLIN HFA) 108 (90 Base) MCG/ACT inhaler Inhale 2 puffs into the lungs every 6 (six) hours as needed  for wheezing or shortness of breath. 8 g 2   aspirin 81 MG chewable tablet CHEW AND SWALLOW 1 TABLET(81 MG) BY MOUTH DAILY (Patient taking differently: Chew 81 mg by mouth daily at 12 noon.) 30 tablet 2   atorvastatin (LIPITOR) 40 MG tablet TAKE 1 TABLET(40 MG) BY MOUTH DAILY AT 6 PM (Patient taking differently: Take 40 mg by mouth daily at 6 PM.) 90 tablet 3   buPROPion (WELLBUTRIN XL) 300 MG 24 hr tablet Take 300 mg by mouth every morning.  1   citalopram (CELEXA) 40 MG tablet Take 40 mg by mouth every morning.  0   ezetimibe (ZETIA) 10 MG tablet Take 1 tablet (10 mg total) by mouth daily. 30 tablet 2   Fluticasone-Umeclidin-Vilant (TRELEGY ELLIPTA) 100-62.5-25 MCG/INH AEPB Inhale 1 puff into the lungs daily. (Patient taking differently: Inhale 1 puff into the lungs every evening.) 28  each 2   lisinopril (ZESTRIL) 40 MG tablet TAKE 1 TABLET(40 MG) BY MOUTH DAILY 60 tablet 0   metoprolol succinate (TOPROL-XL) 25 MG 24 hr tablet TAKE 1/2 TABLET(12.5 MG) BY MOUTH DAILY (Patient taking differently: Take 12.5 mg by mouth daily at 12 noon.) 45 tablet 6   No facility-administered medications prior to visit.    Review of Systems  Constitutional:  Negative for chills, fever, malaise/fatigue and weight loss.  HENT:  Negative for congestion, sinus pain and sore throat.   Eyes: Negative.   Respiratory:  Positive for cough, sputum production, shortness of breath and wheezing. Negative for hemoptysis.   Cardiovascular:  Positive for chest pain. Negative for palpitations, orthopnea, claudication and leg swelling.  Gastrointestinal:  Negative for abdominal pain, heartburn, nausea and vomiting.  Genitourinary: Negative.   Musculoskeletal:  Positive for joint pain. Negative for myalgias.  Skin:  Negative for rash.  Neurological:  Negative for weakness.  Endo/Heme/Allergies: Negative.   Psychiatric/Behavioral:  Positive for depression.     Objective:   Vitals:   05/23/22 1428  BP: 122/74  Pulse: 85  SpO2: 98%  Weight: 186 lb (84.4 kg)  Height: 5\' 10"  (1.778 m)   Physical Exam Constitutional:      General: He is not in acute distress. HENT:     Head: Normocephalic and atraumatic.  Eyes:     Extraocular Movements: Extraocular movements intact.     Conjunctiva/sclera: Conjunctivae normal.     Pupils: Pupils are equal, round, and reactive to light.  Cardiovascular:     Rate and Rhythm: Normal rate and regular rhythm.     Pulses: Normal pulses.     Heart sounds: Normal heart sounds. No murmur heard. Pulmonary:     Effort: Pulmonary effort is normal.     Breath sounds: Wheezing (mild, left anterior) present. No rhonchi.  Abdominal:     General: Bowel sounds are normal.     Palpations: Abdomen is soft.  Musculoskeletal:     Right lower leg: No edema.     Left lower leg:  No edema.  Lymphadenopathy:     Cervical: No cervical adenopathy.  Skin:    General: Skin is warm and dry.  Neurological:     General: No focal deficit present.     Mental Status: He is alert.  Psychiatric:        Mood and Affect: Mood normal.        Behavior: Behavior normal.        Thought Content: Thought content normal.        Judgment: Judgment normal.  CBC    Component Value Date/Time   WBC 18.8 (H) 03/27/2022 0331   RBC 4.37 03/27/2022 0331   HGB 13.6 03/27/2022 0331   HCT 41.7 03/27/2022 0331   PLT 273 03/27/2022 0331   MCV 95.4 03/27/2022 0331   MCH 31.1 03/27/2022 0331   MCHC 32.6 03/27/2022 0331   RDW 13.1 03/27/2022 0331   LYMPHSABS 0.7 03/27/2022 0331   MONOABS 1.2 (H) 03/27/2022 0331   EOSABS 0.0 03/27/2022 0331   BASOSABS 0.0 03/27/2022 0331      Latest Ref Rng & Units 03/27/2022    3:31 AM 03/26/2022    4:07 AM 03/25/2022    3:49 AM  BMP  Glucose 70 - 99 mg/dL 110  155  130   BUN 8 - 23 mg/dL 31  28  21    Creatinine 0.61 - 1.24 mg/dL 1.16  1.37  1.20   Sodium 135 - 145 mmol/L 140  140  138   Potassium 3.5 - 5.1 mmol/L 4.0  4.6  3.9   Chloride 98 - 111 mmol/L 104  105  103   CO2 22 - 32 mmol/L 29  28  27    Calcium 8.9 - 10.3 mg/dL 8.9  9.0  8.8    Chest imaging:  PFT:     No data to display          Labs:  Path:  Echo 03/24/22: LV EF 40-45%. RV systolic function and size is normal.   Heart Catheterization:  Assessment & Plan:   Chronic obstructive pulmonary disease, unspecified COPD type ([place])  Discussion: Gary Wright is a 76 year old male, former smoker with CAD, hypertension, hyperlipidemia and lung cancer s/p LUL lobectomy 10/30/2018 who is referred to pulmonary clinic for COPD.   He has centrilobular emphysema based on CT Chest scan from 2020.   He is to continue trelegy ellipta 1 puff daily and as needed albuterol inhaler/nebulizer treatments.  We will check overnight oximetry test.   We will refer him to lung cancer  screening team.   Follow up in 3 months with pulmonary function tests.  Freda Jackson, MD [facility] Office: [phone]   Current Outpatient Medications:    albuterol (PROVENTIL) (2.5 MG/3ML) 0.083% nebulizer solution, Take 2.5 mg by nebulization 3 (three) times daily as needed for wheezing or shortness of breath., Disp: , Rfl:    albuterol (VENTOLIN HFA) 108 (90 Base) MCG/ACT inhaler, Inhale 2 puffs into the lungs every 6 (six) hours as needed for wheezing or shortness of breath., Disp: 8 g, Rfl: 2   aspirin 81 MG chewable tablet, CHEW AND SWALLOW 1 TABLET(81 MG) BY MOUTH DAILY (Patient taking differently: Chew 81 mg by mouth daily at 12 noon.), Disp: 30 tablet, Rfl: 2   atorvastatin (LIPITOR) 40 MG tablet, TAKE 1 TABLET(40 MG) BY MOUTH DAILY AT 6 PM (Patient taking differently: Take 40 mg by mouth daily at 6 PM.), Disp: 90 tablet, Rfl: 3   buPROPion (WELLBUTRIN XL) 300 MG 24 hr tablet, Take 300 mg by mouth every morning., Disp: , Rfl: 1   citalopram (CELEXA) 40 MG tablet, Take 40 mg by mouth every morning., Disp: , Rfl: 0   ezetimibe (ZETIA) 10 MG tablet, Take 1 tablet (10 mg total) by mouth daily., Disp: 30 tablet, Rfl: 2   Fluticasone-Umeclidin-Vilant (TRELEGY ELLIPTA) 100-62.5-25 MCG/INH AEPB, Inhale 1 puff into the lungs daily. (Patient taking differently: Inhale 1 puff into the lungs every evening.), Disp: 28 each, Rfl: 2  lisinopril (ZESTRIL) 40 MG tablet, TAKE 1 TABLET(40 MG) BY MOUTH DAILY, Disp: 60 tablet, Rfl: 0   metoprolol succinate (TOPROL-XL) 25 MG 24 hr tablet, TAKE 1/2 TABLET(12.5 MG) BY MOUTH DAILY (Patient taking differently: Take 12.5 mg by mouth daily at 12 noon.), Disp: 45 tablet, Rfl: 6

## 2022-05-23 NOTE — Patient Instructions (Addendum)
Continue trelegy ellipta inhaler 1 puff daily.   Continue albuterol inhaler 1-2 puffs every 4-6 hours as needed  Continue albuterol nebulizers as needed  We will refer you to our lung cancer screening team.   Follow up in 3 months with pulmonary function tests

## 2022-05-29 ENCOUNTER — Encounter: Payer: Self-pay | Admitting: Internal Medicine

## 2022-05-29 ENCOUNTER — Telehealth: Payer: Self-pay | Admitting: Pulmonary Disease

## 2022-05-29 NOTE — Telephone Encounter (Signed)
Called patient but he did not answer. Left message for him to call us back tomorrow.

## 2022-05-30 NOTE — Telephone Encounter (Signed)
Called and spoke with patient. He verbalized understanding and will D/C the pantoprazole. Nothing further needed at time of call.

## 2022-05-30 NOTE — Telephone Encounter (Signed)
Called and spoke with patient. He stated that he started the pantoprazole on 05/27/22 in the morning. By the afternoon, he had developed diarrhea. He took it again on 05/28/22 and 05/29/22 and had the same reaction plus stomach cramping. He denied starting any other medications. His stomach and bowel movements were normal before starting the medication. He took his last dose yesterday.   Denied any increased SOB, hives or difficulty swallowing. I advised him to hold the medication until he heard back from [place].   Joellen Jersey, can you please advise since Dr. Erin Fulling is not available today? Thanks!

## 2022-05-30 NOTE — Telephone Encounter (Signed)
Ok to stop. He can try pepcid otc Twice daily for reflux/indigestion to see if he better tolerates this. Thanks!

## 2022-05-31 ENCOUNTER — Ambulatory Visit: Payer: Medicare Other | Admitting: Adult Health

## 2022-06-02 ENCOUNTER — Other Ambulatory Visit: Payer: Self-pay | Admitting: Cardiology

## 2022-06-03 ENCOUNTER — Other Ambulatory Visit: Payer: Self-pay

## 2022-06-03 ENCOUNTER — Telehealth: Payer: Self-pay | Admitting: Pulmonary Disease

## 2022-06-03 MED ORDER — LISINOPRIL 40 MG PO TABS: ORAL_TABLET | ORAL | 2 refills | Status: DC

## 2022-06-03 NOTE — Telephone Encounter (Signed)
Received ONO results from [facility]. Will place results in JD's review folder for him to review once he returns to clinic.

## 2022-06-11 NOTE — Telephone Encounter (Signed)
Called and spoke with pt letting him know that JD will review ONO results once he returns to clinic 8/28 and pt verbalized understanding.

## 2022-06-11 NOTE — Telephone Encounter (Signed)
Called patient to change appointment in November- patient would like to know about ONO results.  Please call back at [phone].

## 2022-06-18 ENCOUNTER — Telehealth: Payer: Self-pay | Admitting: Pulmonary Disease

## 2022-06-18 DIAGNOSIS — J449 Chronic obstructive pulmonary disease, unspecified: Secondary | ICD-10-CM

## 2022-06-20 NOTE — Telephone Encounter (Signed)
Freddi Starr, MD      06/20/22  5:58 AM Note Patient does not qualify or need oxygen at night when sleeping. He only spent 1 min 37 sec less than 88% SpO2.    Thanks, JD      Called and spoke with pt letting him know the results of ONO and he verbalized understanding. Pt wants to have O2 discontinued due to not needing it at night now so order placed for discontinuation. Nothing further needed.

## 2022-06-20 NOTE — Telephone Encounter (Signed)
Patient does not qualify or need oxygen at night when sleeping. He only spent 1 min 37 sec less than 88% SpO2.   Thanks, JD

## 2022-07-27 ENCOUNTER — Other Ambulatory Visit: Payer: Self-pay | Admitting: General Practice

## 2022-08-06 ENCOUNTER — Other Ambulatory Visit: Payer: Self-pay | Admitting: Cardiology

## 2022-08-21 NOTE — Progress Notes (Signed)
HPI: FU coronary artery disease. Patient previously admitted with a non-ST elevation myocardial infarction. Cardiac catheterization 06/13/2016 showed mild diffuse nonobstructive coronary disease. Ejection fraction was 25-30% and felt consistent with tako tsubo CM. Echocardiogram 2 days later showed normal LV function and mild right ventricular enlargement. Abdominal ultrasound February 2021 showed no aneurysm.  Patient seen with shortness of breath and chest pressure June 2023.  He was felt to have acute COPD exacerbation and was treated with steroids and nebulizers.  Troponin 261 and 240.  Echocardiogram June 2023 showed ejection fraction 40 to 45%, inferior hypokinesis.  Nuclear study July 2023 showed ejection fraction 57%, inferior infarct but no ischemia.  Since last seen the patient has dyspnea with more extreme activities but not with routine activities. It is relieved with rest. It is not associated with chest pain. There is no orthopnea, PND or pedal edema. There is no syncope or palpitations. There is no exertional chest pain.   Current Outpatient Medications  Medication Sig Dispense Refill   albuterol (PROVENTIL) (2.5 MG/3ML) 0.083% nebulizer solution Take 2.5 mg by nebulization 3 (three) times daily as needed for wheezing or shortness of breath.     albuterol (VENTOLIN HFA) 108 (90 Base) MCG/ACT inhaler Inhale 2 puffs into the lungs every 6 (six) hours as needed for wheezing or shortness of breath. 8 g 2   aspirin 81 MG chewable tablet CHEW AND SWALLOW 1 TABLET(81 MG) BY MOUTH DAILY (Patient taking differently: Chew 81 mg by mouth daily at 12 noon.) 30 tablet 2   buPROPion (WELLBUTRIN XL) 300 MG 24 hr tablet Take 300 mg by mouth every morning.  1   citalopram (CELEXA) 40 MG tablet Take 40 mg by mouth every morning.  0   ezetimibe (ZETIA) 10 MG tablet TAKE 1 TABLET(10 MG) BY MOUTH DAILY 30 tablet 6   Fluticasone-Umeclidin-Vilant (TRELEGY ELLIPTA) 100-62.5-25 MCG/ACT AEPB Inhale 1 puff  into the lungs daily. 28 each 11   lisinopril (ZESTRIL) 40 MG tablet TAKE 1 TABLET(40 MG) BY MOUTH DAILY 60 tablet 2   metoprolol succinate (TOPROL-XL) 25 MG 24 hr tablet TAKE 1/2 TABLET(12.5 MG) BY MOUTH DAILY (Patient taking differently: Take 12.5 mg by mouth daily at 12 noon.) 45 tablet 6   atorvastatin (LIPITOR) 40 MG tablet TAKE 1 TABLET(40 MG) BY MOUTH DAILY AT 6 PM (Patient not taking: Reported on 08/30/2022) 90 tablet 3   No current facility-administered medications for this visit.     Past Medical History:  Diagnosis Date   CAD (coronary artery disease) of artery bypass graft 06/13/2016   COPD (chronic obstructive pulmonary disease) ([place])    Depression 09/01/2007   Qualifier: Diagnosis of  By: Danny Lawless CMA, [place]     Elevated troponin 06/13/2016   History of lung cancer 11/02/2020   Hyperlipidemia 11/02/2020   Hypertension    Prolonged QT interval 11/02/2020   Stress-induced cardiomyopathy 06/13/2016    Past Surgical History:  Procedure Laterality Date   BACK SURGERY     fusion in [place] N/A 06/13/2016   Procedure: Left Heart Cath and Coronary Angiography;  Surgeon: Nelva Bush, MD;  Location: [facility];  Service: Cardiovascular;  Laterality: N/A;   INGUINAL HERNIA REPAIR Right 04/21/2018   Procedure: RIGHT INGUINAL HERNIA REPAIR ERAS PATHWAY;  Surgeon: Erroll Luna, MD;  Location: [facility];  Service: General;  Laterality: Right;  TAP BLOCK   INSERTION OF MESH Right 04/21/2018   Procedure: INSERTION OF MESH;  Surgeon: Brantley Stage,  Marcello Moores, MD;  Location: [facility];  Service: General;  Laterality: Right;   TONSILLECTOMY      Social History   Socioeconomic History   Marital status: Legally Separated    Spouse name: Not on file   Number of children: Not on file   Years of education: Not on file   Highest education level: Not on file  Occupational History   Not on file  Tobacco Use   Smoking status: Former    Packs/day: 0.50    Types: Cigarettes     Quit date: 2019    Years since quitting: 4.8   Smokeless tobacco: Never  Vaping Use   Vaping Use: Never used  Substance and Sexual Activity   Alcohol use: No    Comment: No alcohol for 17 years   Drug use: No   Sexual activity: Not on file  Other Topics Concern   Not on file  Social History Narrative   Not on file   Social Determinants of Health   Financial Resource Strain: Not on file  Food Insecurity: Not on file  Transportation Needs: Not on file  Physical Activity: Not on file  Stress: Not on file  Social Connections: Not on file  Intimate Partner Violence: Not on file    Family History  Problem Relation Age of Onset   Hypertension Mother    Hypertension Father     ROS: no fevers or chills, productive cough, hemoptysis, dysphasia, odynophagia, melena, hematochezia, dysuria, hematuria, rash, seizure activity, orthopnea, PND, pedal edema, claudication. Remaining systems are negative.  Physical Exam: Well-developed well-nourished in no acute distress.  Skin is warm and dry.  HEENT is normal.  Neck is supple.  Chest with diminished BS throughout Cardiovascular exam is regular rate and rhythm.  Abdominal exam nontender or distended. No masses palpated. Extremities show no edema. neuro grossly intact  A/P  1 elevated troponin-patient recently seen with dyspnea and chest tightness.  He was having a COPD flare which improved with steroids and bronchodilators.  Troponin mildly elevated felt possibly secondary to demand ischemia.  Nuclear study showed inferior infarct but no ischemia.  LV function mildly reduced.  We will likely repeat echocardiogram in 6 months when he returns.  2 history of cardiomyopathy-as above we will plan to repeat echocardiogram in 6 months when he returns.  Continue lisinopril and Toprol.  3 hypertension-patient's blood pressure is controlled.  Continue present medications.  4 hyperlipidemia-patient is not taking his atorvastatin.  Will  resume at 40 mg daily.  Continue Zetia.  Check lipids and liver in 8 weeks.  5 coronary artery disease-plan to continue aspirin and statin.  6 COPD-followed by pulmonary.  Kirk Ruths, MD

## 2022-08-30 ENCOUNTER — Encounter: Payer: Self-pay | Admitting: Cardiology

## 2022-08-30 ENCOUNTER — Ambulatory Visit: Payer: Medicare Other | Attending: Cardiology | Admitting: Cardiology

## 2022-08-30 VITALS — BP 122/76 | HR 71 | Ht 70.0 in | Wt 187.6 lb

## 2022-08-30 DIAGNOSIS — I2510 Atherosclerotic heart disease of native coronary artery without angina pectoris: Secondary | ICD-10-CM | POA: Diagnosis not present

## 2022-08-30 DIAGNOSIS — E7800 Pure hypercholesterolemia, unspecified: Secondary | ICD-10-CM | POA: Diagnosis not present

## 2022-08-30 DIAGNOSIS — R7989 Other specified abnormal findings of blood chemistry: Secondary | ICD-10-CM

## 2022-08-30 DIAGNOSIS — I10 Essential (primary) hypertension: Secondary | ICD-10-CM | POA: Diagnosis not present

## 2022-08-30 MED ORDER — EZETIMIBE 10 MG PO TABS: ORAL_TABLET | ORAL | 3 refills | Status: DC

## 2022-08-30 MED ORDER — ATORVASTATIN CALCIUM 40 MG PO TABS: ORAL_TABLET | ORAL | 3 refills | Status: DC

## 2022-08-30 NOTE — Patient Instructions (Signed)
Medication Instructions:   RESTART ATORVASTATIN 40 MG ONCE DAILY  *If you need a refill on your cardiac medications before your next appointment, please call your pharmacy*   Lab Work:  Your physician recommends that you return for lab work in: [place]  If you have labs (blood work) drawn today and your tests are completely normal, you will receive your results only by: [place] (if you have MyChart) OR A paper copy in the mail If you have any lab test that is abnormal or we need to change your treatment, we will call you to review the results.   Follow-Up: At [facility], you and your health needs are our priority.  As part of our continuing mission to provide you with exceptional heart care, we have created designated Provider Care Teams.  These Care Teams include your primary Cardiologist (physician) and Advanced Practice Providers (APPs -  Physician Assistants and Nurse Practitioners) who all work together to provide you with the care you need, when you need it.  We recommend signing up for the patient portal called "MyChart".  Sign up information is provided on this After Visit Summary.  MyChart is used to connect with patients for Virtual Visits (Telemedicine).  Patients are able to view lab/test results, encounter notes, upcoming appointments, etc.  Non-urgent messages can be sent to your provider as well.   To learn more about what you can do with MyChart, go to [email or web address].    Your next appointment:   6 month(s)  The format for your next appointment:   In Person  Provider:   Kirk Ruths, MD

## 2022-09-11 ENCOUNTER — Ambulatory Visit (INDEPENDENT_AMBULATORY_CARE_PROVIDER_SITE_OTHER): Payer: Medicare Other | Admitting: Pulmonary Disease

## 2022-09-11 ENCOUNTER — Ambulatory Visit (INDEPENDENT_AMBULATORY_CARE_PROVIDER_SITE_OTHER): Payer: Medicare Other | Admitting: Nurse Practitioner

## 2022-09-11 ENCOUNTER — Encounter: Payer: Self-pay | Admitting: Nurse Practitioner

## 2022-09-11 ENCOUNTER — Ambulatory Visit (INDEPENDENT_AMBULATORY_CARE_PROVIDER_SITE_OTHER): Payer: Medicare Other

## 2022-09-11 VITALS — BP 124/62 | HR 73 | Ht 70.0 in | Wt 190.0 lb

## 2022-09-11 DIAGNOSIS — J441 Chronic obstructive pulmonary disease with (acute) exacerbation: Secondary | ICD-10-CM | POA: Diagnosis not present

## 2022-09-11 DIAGNOSIS — J449 Chronic obstructive pulmonary disease, unspecified: Secondary | ICD-10-CM | POA: Diagnosis not present

## 2022-09-11 LAB — PULMONARY FUNCTION TEST
DL/VA % pred: 78 %
DL/VA: 3.1 ml/min/mmHg/L
DLCO cor % pred: 65 %
DLCO cor: 16.42 ml/min/mmHg
DLCO unc % pred: 65 %
DLCO unc: 16.42 ml/min/mmHg
FEF 25-75 Post: 1.11 L/sec
FEF 25-75 Pre: 0.76 L/sec
FEF2575-%Change-Post: 46 %
FEF2575-%Pred-Post: 51 %
FEF2575-%Pred-Pre: 34 %
FEV1-%Change-Post: 15 %
FEV1-%Pred-Post: 60 %
FEV1-%Pred-Pre: 52 %
FEV1-Post: 1.83 L
FEV1-Pre: 1.58 L
FEV1FVC-%Change-Post: 6 %
FEV1FVC-%Pred-Pre: 73 %
FEV6-%Change-Post: 9 %
FEV6-%Pred-Post: 80 %
FEV6-%Pred-Pre: 74 %
FEV6-Post: 3.18 L
FEV6-Pre: 2.91 L
FEV6FVC-%Change-Post: 0 %
FEV6FVC-%Pred-Post: 105 %
FEV6FVC-%Pred-Pre: 104 %
FVC-%Change-Post: 8 %
FVC-%Pred-Post: 76 %
FVC-%Pred-Pre: 70 %
FVC-Post: 3.22 L
FVC-Pre: 2.96 L
Post FEV1/FVC ratio: 57 %
Post FEV6/FVC ratio: 99 %
Pre FEV1/FVC ratio: 54 %
Pre FEV6/FVC Ratio: 98 %
RV % pred: 132 %
RV: 3.42 L
TLC % pred: 96 %
TLC: 6.78 L

## 2022-09-11 MED ORDER — TRELEGY ELLIPTA 200-62.5-25 MCG/ACT IN AEPB: 1.0000 | INHALATION_SPRAY | Freq: Every day | RESPIRATORY_TRACT | 0 refills | Status: DC

## 2022-09-11 NOTE — Progress Notes (Signed)
@Patient  ID: Gary Wright, male    DOB: 1945-12-31, 76 y.o.   MRN: [id number]  Chief Complaint  Patient presents with   Follow-up    Here for PFT results     Referring provider: Leighton Ruff  HPI: 76 year old male, former smoker followed for COPD.  He is a patient of Dr. August Albino and last seen in office 05/23/2022.  Past medical history significant for CAD, hypertension, HLD.  He does have a history of lung cancer status post left upper lobectomy 10/30/2018.  TEST/EVENTS:  05/2022 ONO: 1 minute 37 seconds less than 88% 09/11/2022 PFT: FVC 70, FEV1 52, ratio 57, TLC 96, DLCOunc 65.  Moderately severe obstructive disease with reversibility (15% improvement)  05/23/2022: OV with Dr. Erin Fulling.  Admitted 6/4 to 6/7 for COPD exacerbation.  He has had ongoing dyspnea.  Does have some wheezing intermittently.  Has a cough with some phlegm, light brown in color.  Does have oxygen equipment at home but is not using it.  Symptoms are his baseline.  He will continue on Trelegy and as needed [place].  Order Joselyn Arrow.  Referred to lung cancer screening team.  Scheduled pulmonary function testing in 3 months.  09/11/2022: Today-follow-up Patient presents today for follow-up after undergoing pulmonary function testing which revealed moderately severe obstructive defect with reversibility and moderate diffusion defect.  He has been doing okay since he was here last.  He does feel like the Trelegy helped him; having some trouble with insurance coverage.  He plans to switch the beginning of the year and was told that it should only cost $58 a month.  Unfortunately, he has been having some acute symptoms over the last 2 weeks.  Has had an increased productive cough with brown sputum, more wheezing and feels little more short of breath.  Also having some chest congestion.  Denies any hemoptysis, fevers, chills, night sweats, lower extremity swelling.  No interim sick exposures.  No URI symptoms.  Feeling a little more  fatigued and weak. Eating and drinking well. Not using any over-the-counter cough syrups.  No Known Allergies  Immunization History  Administered Date(s) Administered   Influenza, High Dose Seasonal PF 07/21/2018   Influenza-Unspecified 07/10/2022   PFIZER(Purple Top)SARS-COV-2 Vaccination 12/31/2019, 01/24/2020   Pneumococcal Polysaccharide-23 06/14/2016   Pneumococcal-Unspecified 09/10/2022   Respiratory Syncytial Virus Vaccine,Recomb Aduvanted(Arexvy) 09/10/2022    Past Medical History:  Diagnosis Date   CAD (coronary artery disease) of artery bypass graft 06/13/2016   COPD (chronic obstructive pulmonary disease) ([place])    Depression 09/01/2007   Qualifier: Diagnosis of  By: Danny Lawless CMA, [place]     Elevated troponin 06/13/2016   History of lung cancer 11/02/2020   Hyperlipidemia 11/02/2020   Hypertension    Prolonged QT interval 11/02/2020   Stress-induced cardiomyopathy 06/13/2016    Tobacco History: Social History   Tobacco Use  Smoking Status Former   Packs/day: 0.50   Types: Cigarettes   Quit date: 2019   Years since quitting: 4.8  Smokeless Tobacco Never   Counseling given: Not Answered   Outpatient Medications Prior to Visit  Medication Sig Dispense Refill   albuterol (PROVENTIL) (2.5 MG/3ML) 0.083% nebulizer solution Take 2.5 mg by nebulization 3 (three) times daily as needed for wheezing or shortness of breath.     albuterol (VENTOLIN HFA) 108 (90 Base) MCG/ACT inhaler Inhale 2 puffs into the lungs every 6 (six) hours as needed for wheezing or shortness of breath. 8 g 2   aspirin 81 MG chewable tablet  CHEW AND SWALLOW 1 TABLET(81 MG) BY MOUTH DAILY (Patient taking differently: Chew 81 mg by mouth daily at 12 noon.) 30 tablet 2   atorvastatin (LIPITOR) 40 MG tablet TAKE 1 TABLET(40 MG) BY MOUTH DAILY AT 6 PM 90 tablet 3   buPROPion (WELLBUTRIN XL) 300 MG 24 hr tablet Take 300 mg by mouth every morning.  1   citalopram (CELEXA) 40 MG tablet Take 40 mg by mouth every  morning.  0   ezetimibe (ZETIA) 10 MG tablet TAKE 1 TABLET(10 MG) BY MOUTH DAILY 90 tablet 3   Fluticasone-Umeclidin-Vilant (TRELEGY ELLIPTA) 100-62.5-25 MCG/ACT AEPB Inhale 1 puff into the lungs daily. 28 each 11   lisinopril (ZESTRIL) 40 MG tablet TAKE 1 TABLET(40 MG) BY MOUTH DAILY 60 tablet 2   metoprolol succinate (TOPROL-XL) 25 MG 24 hr tablet TAKE 1/2 TABLET(12.5 MG) BY MOUTH DAILY (Patient taking differently: Take 12.5 mg by mouth daily at 12 noon.) 45 tablet 6   No facility-administered medications prior to visit.     Review of Systems:   Constitutional: No weight loss or gain, night sweats, fevers, chills. +fatigue, lassitude. HEENT: No headaches, difficulty swallowing, tooth/dental problems, or sore throat. No sneezing, itching, ear ache, nasal congestion, or post nasal drip CV:  No chest pain, orthopnea, PND, swelling in lower extremities, anasarca, dizziness, palpitations, syncope Resp: +shortness of breath with exertion; productive cough; wheezing; chest congestion. No hemoptysis. No chest wall deformity GI:  No heartburn, indigestion, abdominal pain, nausea, vomiting, diarrhea, change in bowel habits, loss of appetite, bloody stools.  MSK:  No joint pain or swelling.   Neuro: No dizziness or lightheadedness.  Psych: No depression or anxiety. Mood stable.     Physical Exam:  BP 124/62 (BP Location: Left Arm, Patient Position: Sitting, Cuff Size: Normal)   Pulse 73   Ht 5\' 10"  (1.778 m)   Wt 190 lb (86.2 kg)   SpO2 96%   BMI 27.26 kg/m   GEN: Pleasant, interactive, well-appearing; in no acute distress. HEENT:  Normocephalic and atraumatic. PERRLA. Sclera white. Nasal turbinates pink, moist and patent bilaterally. No rhinorrhea present. Oropharynx pink and moist, without exudate or edema. No lesions, ulcerations, or postnasal drip.  NECK:  Supple w/ fair ROM. No JVD present. Normal carotid impulses w/o bruits. Thyroid symmetrical with no goiter or nodules palpated. No  lymphadenopathy.   CV: RRR, no m/r/g, no peripheral edema. Pulses intact, +2 bilaterally. No cyanosis, pallor or clubbing. PULMONARY:  Unlabored, regular breathing. Minimal end expiratory wheezes bilaterally A&P. No accessory muscle use.  GI: BS present and normoactive. Soft, non-tender to palpation. No organomegaly or masses detected. MSK: No erythema, warmth or tenderness. Cap refil <2 sec all extrem. No deformities or joint swelling noted.  Neuro: A/Ox3. No focal deficits noted.   Skin: Warm, no lesions or rashe Psych: Normal affect and behavior. Judgement and thought content appropriate.     Lab Results:  CBC    Component Value Date/Time   WBC 18.8 (H) 03/27/2022 0331   RBC 4.37 03/27/2022 0331   HGB 13.6 03/27/2022 0331   HCT 41.7 03/27/2022 0331   PLT 273 03/27/2022 0331   MCV 95.4 03/27/2022 0331   MCH 31.1 03/27/2022 0331   MCHC 32.6 03/27/2022 0331   RDW 13.1 03/27/2022 0331   LYMPHSABS 0.7 03/27/2022 0331   MONOABS 1.2 (H) 03/27/2022 0331   EOSABS 0.0 03/27/2022 0331   BASOSABS 0.0 03/27/2022 0331    BMET    Component Value Date/Time   NA  140 03/27/2022 0331   NA 139 01/18/2021 1423   K 4.0 03/27/2022 0331   CL 104 03/27/2022 0331   CO2 29 03/27/2022 0331   GLUCOSE 110 (H) 03/27/2022 0331   BUN 31 (H) 03/27/2022 0331   BUN 14 01/18/2021 1423   CREATININE 1.16 03/27/2022 0331   CREATININE 0.87 07/01/2016 1032   CALCIUM 8.9 03/27/2022 0331   GFRNONAA >60 03/27/2022 0331   GFRAA 57 (L) 12/14/2019 1025    BNP    Component Value Date/Time   BNP 80.6 03/24/2022 1120     Imaging:  No results found.        No data to display          No results found for: "NITRICOXIDE"      Assessment & Plan:   COPD exacerbation ([place]) Moderately severe COPD with acute exacerbation. We will treat him with prednisone taper and empiric course of doxycycline for 7 days. He will continue on triple therapy regimen with trelegy - provided with samples today.  Hopefully this will cover him until he can switch insurance the first of the year. CXR to rule out superimposed infection  Patient Instructions  Continue Albuterol inhaler 2 puffs or 3 mL neb every 6 hours as needed for shortness of breath or wheezing. Notify if symptoms persist despite rescue inhaler/neb use.  Continue Trelegy 1 puff daily. Brush tongue and rinse mouth afterwards. Provided with samples until your new insurance kicks in and it's more affordable   Prednisone taper. 4 tabs for 2 days, then 3 tabs for 2 days, 2 tabs for 2 days, then 1 tab for 2 days, then stop. Take in AM with food Doxycycline 1 tab Twice daily for 7 days. Take with food. Wear sunscreen or avoid excessive sun exposure as medication increases your risk for sunburn Guaifenesin [phone] mg Twice daily for chest congestion  Chest x ray today  Follow up in 2 weeks with Dr. Erin Fulling or Alanson Aly. If symptoms do not improve or worsen, please contact office for sooner follow up or seek emergency care.    I spent 35 minutes of dedicated to the care of this patient on the date of this encounter to include pre-visit review of records, face-to-face time with the patient discussing conditions above, post visit ordering of testing, clinical documentation with the electronic health record, making appropriate referrals as documented, and communicating necessary findings to members of the patients care team.  Clayton Bibles, NP 09/11/2022  Pt aware and understands NP's role.

## 2022-09-11 NOTE — Patient Instructions (Signed)
Full PFT performed today. °

## 2022-09-11 NOTE — Assessment & Plan Note (Signed)
Moderately severe COPD with acute exacerbation. We will treat him with prednisone taper and empiric course of doxycycline for 7 days. He will continue on triple therapy regimen with trelegy - provided with samples today. Hopefully this will cover him until he can switch insurance the first of the year. CXR to rule out superimposed infection  Patient Instructions  Continue Albuterol inhaler 2 puffs or 3 mL neb every 6 hours as needed for shortness of breath or wheezing. Notify if symptoms persist despite rescue inhaler/neb use.  Continue Trelegy 1 puff daily. Brush tongue and rinse mouth afterwards. Provided with samples until your new insurance kicks in and it's more affordable   Prednisone taper. 4 tabs for 2 days, then 3 tabs for 2 days, 2 tabs for 2 days, then 1 tab for 2 days, then stop. Take in AM with food Doxycycline 1 tab Twice daily for 7 days. Take with food. Wear sunscreen or avoid excessive sun exposure as medication increases your risk for sunburn Guaifenesin [phone] mg Twice daily for chest congestion  Chest x ray today  Follow up in 2 weeks with Dr. Erin Fulling or Alanson Aly. If symptoms do not improve or worsen, please contact office for sooner follow up or seek emergency care.

## 2022-09-11 NOTE — Patient Instructions (Signed)
Continue Albuterol inhaler 2 puffs or 3 mL neb every 6 hours as needed for shortness of breath or wheezing. Notify if symptoms persist despite rescue inhaler/neb use.  Continue Trelegy 1 puff daily. Brush tongue and rinse mouth afterwards. Provided with samples until your new insurance kicks in and it's more affordable   Prednisone taper. 4 tabs for 2 days, then 3 tabs for 2 days, 2 tabs for 2 days, then 1 tab for 2 days, then stop. Take in AM with food Doxycycline 1 tab Twice daily for 7 days. Take with food. Wear sunscreen or avoid excessive sun exposure as medication increases your risk for sunburn Guaifenesin [phone] Twice daily for chest congestion  Chest x ray today  Follow up in 2 weeks with Dr. Erin Fulling or Alanson Aly. If symptoms do not improve or worsen, please contact office for sooner follow up or seek emergency care.

## 2022-09-11 NOTE — Progress Notes (Signed)
Full PFT performed today. °

## 2022-09-13 ENCOUNTER — Telehealth: Payer: Self-pay | Admitting: Nurse Practitioner

## 2022-09-13 MED ORDER — PREDNISONE 10 MG PO TABS: ORAL_TABLET | ORAL | 0 refills | Status: AC

## 2022-09-13 MED ORDER — DOXYCYCLINE HYCLATE 100 MG PO TABS: 100.0000 mg | ORAL_TABLET | Freq: Two times a day (BID) | ORAL | 0 refills | Status: DC

## 2022-09-13 NOTE — Telephone Encounter (Signed)
Patient Instructions  Continue Albuterol inhaler 2 puffs or 3 mL neb every 6 hours as needed for shortness of breath or wheezing. Notify if symptoms persist despite rescue inhaler/neb use.  Continue Trelegy 1 puff daily. Brush tongue and rinse mouth afterwards. Provided with samples until your new insurance kicks in and it's more affordable    Prednisone taper. 4 tabs for 2 days, then 3 tabs for 2 days, 2 tabs for 2 days, then 1 tab for 2 days, then stop. Take in AM with food Doxycycline 1 tab Twice daily for 7 days. Take with food. Wear sunscreen or avoid excessive sun exposure as medication increases your risk for sunburn Guaifenesin [phone] Twice daily for chest congestion   Chest x ray today   Follow up in 2 weeks with Dr. Erin Fulling or Alanson Aly. If symptoms do not improve or worsen, please contact office for sooner follow up or seek emergency care.      Rx for doxycycline and pred taper have been sent to preferred pharmacy for pt. Called and spoke with pt letting him know this had been done and he verbalized understanding. Nothing further needed.

## 2022-09-25 ENCOUNTER — Ambulatory Visit (INDEPENDENT_AMBULATORY_CARE_PROVIDER_SITE_OTHER): Payer: Medicare Other | Admitting: Nurse Practitioner

## 2022-09-25 ENCOUNTER — Encounter: Payer: Self-pay | Admitting: Nurse Practitioner

## 2022-09-25 VITALS — BP 122/62 | HR 88 | Ht 70.0 in | Wt 188.5 lb

## 2022-09-25 DIAGNOSIS — J441 Chronic obstructive pulmonary disease with (acute) exacerbation: Secondary | ICD-10-CM | POA: Diagnosis not present

## 2022-09-25 NOTE — Patient Instructions (Addendum)
Continue Albuterol inhaler 2 puffs or 3 mL neb every 6 hours as needed for shortness of breath or wheezing. Notify if symptoms persist despite rescue inhaler/neb use.  Continue Trelegy 1 puff daily. Brush tongue and rinse mouth afterwards.  Continue Guaifenesin [phone] Twice daily for chest congestion   Follow up in 3 months with Dr. Erin Fulling or Alanson Aly. If symptoms do not improve or worsen, please contact office for sooner follow up or seek emergency care.

## 2022-09-25 NOTE — Progress Notes (Signed)
@Patient  ID: Gary Wright, male    DOB: 02/08/1946, 76 y.o.   MRN: [id number]  Chief Complaint  Patient presents with   Follow-up    Coughing has improved still feeling weak    Referring provider: Leighton Ruff  HPI: 76 year old male, former smoker followed for COPD.  He is a patient of Dr. August Albino and last seen in office 09/11/2022 by [facility] NP.  Past medical history significant for CAD, hypertension, HLD.  He does have a history of lung cancer status post left upper lobectomy 10/30/2018.  TEST/EVENTS:  05/2022 ONO: 1 minute 37 seconds less than 88% 09/11/2022 PFT: FVC 70, FEV1 52, ratio 57, TLC 96, DLCOunc 65.  Moderately severe obstructive disease with reversibility (15% improvement)  05/23/2022: OV with Dr. Erin Fulling.  Admitted 6/4 to 6/7 for COPD exacerbation.  He has had ongoing dyspnea.  Does have some wheezing intermittently.  Has a cough with some phlegm, light brown in color.  Does have oxygen equipment at home but is not using it.  Symptoms are his baseline.  He will continue on Trelegy and as needed [place].  Order Joselyn Arrow.  Referred to lung cancer screening team.  Scheduled pulmonary function testing in 3 months.  09/11/2022: Ov with Raegan Winders NP for follow-up after undergoing pulmonary function testing which revealed moderately severe obstructive defect with reversibility and moderate diffusion defect.  He has been doing okay since he was here last.  He does feel like the Trelegy helped him; having some trouble with insurance coverage.  He plans to switch the beginning of the year and was told that it should only cost $58 a month.  Unfortunately, he has been having some acute symptoms over the last 2 weeks.  Has had an increased productive cough with brown sputum, more wheezing and feels little more short of breath.  Also having some chest congestion.  Denies any hemoptysis, fevers, chills, night sweats, lower extremity swelling.  No interim sick exposures.  No URI symptoms.  Feeling a  little more fatigued and weak. Eating and drinking well. Not using any over-the-counter cough syrups. Treated for AECOPD with prednisone and empiric doxycycline.  09/25/2022: Today - follow up Patient presents today for follow up after being treated for AECOPD. Symptoms have improved today. His cough has mostly resolved. Feels like his breathing is back to his baseline. He's still having some fatigue and weakness, but this is slowly improving. Just not quite back to normal. He denies fevers, chills, hemoptysis, lower extremity swelling, wheezing. He is using Trelegy; feels like this works well for him. Still taking mucinex.   No Known Allergies  Immunization History  Administered Date(s) Administered   Influenza, High Dose Seasonal PF 07/21/2018   Influenza-Unspecified 07/10/2022   PFIZER(Purple Top)SARS-COV-2 Vaccination 12/31/2019, 01/24/2020   Pneumococcal Polysaccharide-23 06/14/2016   Pneumococcal-Unspecified 09/10/2022   Respiratory Syncytial Virus Vaccine,Recomb Aduvanted(Arexvy) 09/10/2022    Past Medical History:  Diagnosis Date   CAD (coronary artery disease) of artery bypass graft 06/13/2016   COPD (chronic obstructive pulmonary disease) ([place])    Depression 09/01/2007   Qualifier: Diagnosis of  By: Danny Lawless CMA, [place]     Elevated troponin 06/13/2016   History of lung cancer 11/02/2020   Hyperlipidemia 11/02/2020   Hypertension    Prolonged QT interval 11/02/2020   Stress-induced cardiomyopathy 06/13/2016    Tobacco History: Social History   Tobacco Use  Smoking Status Former   Packs/day: 0.50   Types: Cigarettes   Quit date: 2019   Years since quitting:  4.9  Smokeless Tobacco Never   Counseling given: Not Answered   Outpatient Medications Prior to Visit  Medication Sig Dispense Refill   albuterol (PROVENTIL) (2.5 MG/3ML) 0.083% nebulizer solution Take 2.5 mg by nebulization 3 (three) times daily as needed for wheezing or shortness of breath.     albuterol  (VENTOLIN HFA) 108 (90 Base) MCG/ACT inhaler Inhale 2 puffs into the lungs every 6 (six) hours as needed for wheezing or shortness of breath. 8 g 2   aspirin 81 MG chewable tablet CHEW AND SWALLOW 1 TABLET(81 MG) BY MOUTH DAILY (Patient taking differently: Chew 81 mg by mouth daily at 12 noon.) 30 tablet 2   atorvastatin (LIPITOR) 40 MG tablet TAKE 1 TABLET(40 MG) BY MOUTH DAILY AT 6 PM 90 tablet 3   buPROPion (WELLBUTRIN XL) 300 MG 24 hr tablet Take 300 mg by mouth every morning.  1   citalopram (CELEXA) 40 MG tablet Take 40 mg by mouth every morning.  0   doxycycline (VIBRA-TABS) 100 MG tablet Take 1 tablet (100 mg total) by mouth 2 (two) times daily. 14 tablet 0   ezetimibe (ZETIA) 10 MG tablet TAKE 1 TABLET(10 MG) BY MOUTH DAILY 90 tablet 3   Fluticasone-Umeclidin-Vilant (TRELEGY ELLIPTA) 100-62.5-25 MCG/ACT AEPB Inhale 1 puff into the lungs daily. 28 each 11   Fluticasone-Umeclidin-Vilant (TRELEGY ELLIPTA) 200-62.5-25 MCG/ACT AEPB Inhale 1 puff into the lungs daily. 2 each 0   lisinopril (ZESTRIL) 40 MG tablet TAKE 1 TABLET(40 MG) BY MOUTH DAILY 60 tablet 2   metoprolol succinate (TOPROL-XL) 25 MG 24 hr tablet TAKE 1/2 TABLET(12.5 MG) BY MOUTH DAILY (Patient taking differently: Take 12.5 mg by mouth daily at 12 noon.) 45 tablet 6   No facility-administered medications prior to visit.     Review of Systems:   Constitutional: No weight loss or gain, night sweats, fevers, chills. +fatigue, lassitude (slowly improving) HEENT: No headaches, difficulty swallowing, tooth/dental problems, or sore throat. No sneezing, itching, ear ache, nasal congestion, or post nasal drip CV:  No chest pain, orthopnea, PND, swelling in lower extremities, anasarca, dizziness, palpitations, syncope Resp: +shortness of breath with exertion (baseline). No cough. No wheezing. No hemoptysis. No chest wall deformity GI:  No heartburn, indigestion, abdominal pain, nausea, vomiting, diarrhea, change in bowel habits, loss  of appetite, bloody stools.  MSK:  No joint pain or swelling.   Neuro: No dizziness or lightheadedness.  Psych: No depression or anxiety. Mood stable.     Physical Exam:  BP 122/62 (BP Location: Right Arm, Patient Position: Sitting, Cuff Size: Normal)   Pulse 88   Ht 5\' 10"  (1.778 m)   Wt 188 lb 8 oz (85.5 kg)   SpO2 97%   BMI 27.05 kg/m   GEN: Pleasant, interactive, well-appearing; in no acute distress. HEENT:  Normocephalic and atraumatic. PERRLA. Sclera white. Nasal turbinates pink, moist and patent bilaterally. No rhinorrhea present. Oropharynx pink and moist, without exudate or edema. No lesions, ulcerations, or postnasal drip.  NECK:  Supple w/ fair ROM. No JVD present. Normal carotid impulses w/o bruits. Thyroid symmetrical with no goiter or nodules palpated. No lymphadenopathy.   CV: RRR, no m/r/g, no peripheral edema. Pulses intact, +2 bilaterally. No cyanosis, pallor or clubbing. PULMONARY:  Unlabored, regular breathing. Clear bilaterally A&P w/o wheezes/rales/rhonchi. No accessory muscle use.  GI: BS present and normoactive. Soft, non-tender to palpation. No organomegaly or masses detected. MSK: No erythema, warmth or tenderness. Cap refil <2 sec all extrem. No deformities or joint swelling noted.  Neuro: A/Ox3. No focal deficits noted.   Skin: Warm, no lesions or rashe Psych: Normal affect and behavior. Judgement and thought content appropriate.     Lab Results:  CBC    Component Value Date/Time   WBC 18.8 (H) 03/27/2022 0331   RBC 4.37 03/27/2022 0331   HGB 13.6 03/27/2022 0331   HCT 41.7 03/27/2022 0331   PLT 273 03/27/2022 0331   MCV 95.4 03/27/2022 0331   MCH 31.1 03/27/2022 0331   MCHC 32.6 03/27/2022 0331   RDW 13.1 03/27/2022 0331   LYMPHSABS 0.7 03/27/2022 0331   MONOABS 1.2 (H) 03/27/2022 0331   EOSABS 0.0 03/27/2022 0331   BASOSABS 0.0 03/27/2022 0331    BMET    Component Value Date/Time   NA 140 03/27/2022 0331   NA 139 01/18/2021 1423   K  4.0 03/27/2022 0331   CL 104 03/27/2022 0331   CO2 29 03/27/2022 0331   GLUCOSE 110 (H) 03/27/2022 0331   BUN 31 (H) 03/27/2022 0331   BUN 14 01/18/2021 1423   CREATININE 1.16 03/27/2022 0331   CREATININE 0.87 07/01/2016 1032   CALCIUM 8.9 03/27/2022 0331   GFRNONAA >60 03/27/2022 0331   GFRAA 57 (L) 12/14/2019 1025    BNP    Component Value Date/Time   BNP 80.6 03/24/2022 1120     Imaging:  DG Chest 2 View  Result Date: 09/11/2022 CLINICAL DATA:  COPD.  Productive cough. EXAM: CHEST - 2 VIEW COMPARISON:  03/27/2022 FINDINGS: Heart size and mediastinal contours are unremarkable. Postop change from left upper lobectomy. There is no pleural effusion or edema identified. No airspace opacities identified. The visualized osseous structures are notable for bilateral glenohumeral joint osteoarthritis and thoracic spondylosis. IMPRESSION: No active cardiopulmonary abnormalities. Electronically Signed   By: [name] M.D.   On: 09/11/2022 11:30         Latest Ref Rng & Units 09/11/2022    9:47 AM  PFT Results  FVC-Pre L 2.96   FVC-Predicted Pre % 70   FVC-Post L 3.22   FVC-Predicted Post % 76   Pre FEV1/FVC % % 54   Post FEV1/FCV % % 57   FEV1-Pre L 1.58   FEV1-Predicted Pre % 52   FEV1-Post L 1.83   DLCO uncorrected ml/min/mmHg 16.42   DLCO UNC% % 65   DLCO corrected ml/min/mmHg 16.42   DLCO COR %Predicted % 65   DLVA Predicted % 78   TLC L 6.78   TLC % Predicted % 96   RV % Predicted % 132     No results found for: "NITRICOXIDE"      Assessment & Plan:   COPD exacerbation ([facility]) Moderately severe COPD. Resolved AECOPD. Still has some residual fatigue/weakness but overall clinically improved. He will let us know if fatigue persists. Otherwise, he will continue triple therapy regimen and albuterol PRN. Activity encouraged, as tolerated.   Patient Instructions  Continue Albuterol inhaler 2 puffs or 3 mL neb every 6 hours as needed for shortness of breath or  wheezing. Notify if symptoms persist despite rescue inhaler/neb use.  Continue Trelegy 1 puff daily. Brush tongue and rinse mouth afterwards.  Continue Guaifenesin [phone] Twice daily for chest congestion   Follow up in 3 months with Dr. Erin Fulling or Alanson Aly. If symptoms do not improve or worsen, please contact office for sooner follow up or seek emergency care.   I spent 25 minutes of dedicated to the care of this patient on the date of  this encounter to include pre-visit review of records, face-to-face time with the patient discussing conditions above, post visit ordering of testing, clinical documentation with the electronic health record, making appropriate referrals as documented, and communicating necessary findings to members of the patients care team.  Clayton Bibles, NP 09/25/2022  Pt aware and understands NP's role.

## 2022-09-25 NOTE — Assessment & Plan Note (Signed)
Moderately severe COPD. Resolved AECOPD. Still has some residual fatigue/weakness but overall clinically improved. He will let us know if fatigue persists. Otherwise, he will continue triple therapy regimen and albuterol PRN. Activity encouraged, as tolerated.   Patient Instructions  Continue Albuterol inhaler 2 puffs or 3 mL neb every 6 hours as needed for shortness of breath or wheezing. Notify if symptoms persist despite rescue inhaler/neb use.  Continue Trelegy 1 puff daily. Brush tongue and rinse mouth afterwards.  Continue Guaifenesin [phone] mg Twice daily for chest congestion   Follow up in 3 months with Dr. Erin Fulling or Alanson Aly. If symptoms do not improve or worsen, please contact office for sooner follow up or seek emergency care.

## 2022-12-05 ENCOUNTER — Encounter: Payer: Self-pay | Admitting: *Deleted

## 2022-12-06 ENCOUNTER — Encounter: Payer: Self-pay | Admitting: Nurse Practitioner

## 2022-12-06 ENCOUNTER — Ambulatory Visit: Payer: Medicare Other | Admitting: Nurse Practitioner

## 2022-12-06 VITALS — BP 124/72 | HR 81 | Temp 97.8°F | Ht 69.0 in | Wt 189.6 lb

## 2022-12-06 DIAGNOSIS — J441 Chronic obstructive pulmonary disease with (acute) exacerbation: Secondary | ICD-10-CM

## 2022-12-06 MED ORDER — PREDNISONE 10 MG PO TABS: ORAL_TABLET | ORAL | 0 refills | Status: DC

## 2022-12-06 MED ORDER — DOXYCYCLINE HYCLATE 100 MG PO TABS: 100.0000 mg | ORAL_TABLET | Freq: Two times a day (BID) | ORAL | 0 refills | Status: AC

## 2022-12-06 MED ORDER — METHYLPREDNISOLONE ACETATE 80 MG/ML IJ SUSP
80.0000 mg | Freq: Once | INTRAMUSCULAR | Status: AC
  Administered 2022-12-06: 80 mg via INTRAMUSCULAR

## 2022-12-06 MED ORDER — ALBUTEROL SULFATE (2.5 MG/3ML) 0.083% IN NEBU
2.5000 mg | INHALATION_SOLUTION | Freq: Once | RESPIRATORY_TRACT | Status: AC
  Administered 2022-12-06: 2.5 mg via RESPIRATORY_TRACT

## 2022-12-06 NOTE — Patient Instructions (Addendum)
Continue Albuterol inhaler 2 puffs or 3 mL neb every 6 hours as needed for shortness of breath or wheezing. Notify if symptoms persist despite rescue inhaler/neb use. Use your neb treatments at least twice daily until symptoms improve  Continue Trelegy 1 puff daily. Brush tongue and rinse mouth afterwards.    Prednisone taper. 4 tabs for 2 days, then 3 tabs for 2 days, 2 tabs for 2 days, then 1 tab for 2 days, then stop. Take in AM with food Doxycycline 1 tab Twice daily for 7 days. Take with food. Wear sunscreen or avoid excessive sun exposure as medication increases your risk for sunburn Guaifenesin [phone] mg Twice daily for chest congestion  We will probably start you on a medication called Daliresp when you return in 2 weeks to help prevent/reduce future COPD flares    Follow up in 2 weeks with Dr. Erin Fulling or Katie Myria Steenbergen,NP. If symptoms do not improve or worsen, please contact office for sooner follow up or seek emergency care.

## 2022-12-06 NOTE — Progress Notes (Signed)
@Patient  ID: Gary Wright, male    DOB: January 31, 1946, 77 y.o.   MRN: [id number]  No chief complaint on file.   Referring provider: Leighton Ruff  HPI: 77 year old male, former smoker followed for COPD.  He is a patient of Dr. August Albino and last seen in office 09/25/2022 by Belenda Cruise NP.  Past medical history significant for CAD, hypertension, HLD.  He does have a history of lung cancer status post left upper lobectomy 10/30/2018.  TEST/EVENTS:  05/2022 ONO: 1 minute 37 seconds less than 88% 09/11/2022 PFT: FVC 70, FEV1 52, ratio 57, TLC 96, DLCOunc 65.  Moderately severe obstructive disease with reversibility (15% improvement) 09/11/2022 CXR: Postop change from left upper lobectomy.  No acute process.  05/23/2022: OV with Dr. Erin Fulling.  Admitted 6/4 to 6/7 for COPD exacerbation.  He has had ongoing dyspnea.  Does have some wheezing intermittently.  Has a cough with some phlegm, light brown in color.  Does have oxygen equipment at home but is not using it.  Symptoms are his baseline.  He will continue on Trelegy and as needed [place].  Order Joselyn Arrow.  Referred to lung cancer screening team.  Scheduled pulmonary function testing in 3 months.  09/11/2022: Ov with Tobey Lippard NP for follow-up after undergoing pulmonary function testing which revealed moderately severe obstructive defect with reversibility and moderate diffusion defect.  He has been doing okay since he was here last.  He does feel like the Trelegy helped him; having some trouble with insurance coverage.  He plans to switch the beginning of the year and was told that it should only cost $58 a month.  Unfortunately, he has been having some acute symptoms over the last 2 weeks.  Has had an increased productive cough with brown sputum, more wheezing and feels little more short of breath.  Also having some chest congestion.  Denies any hemoptysis, fevers, chills, night sweats, lower extremity swelling.  No interim sick exposures.  No URI symptoms.  Feeling  a little more fatigued and weak. Eating and drinking well. Not using any over-the-counter cough syrups. Treated for AECOPD with prednisone and empiric doxycycline.  09/25/2022: OV with Jaylaa Gallion NP for follow up after being treated for AECOPD. Symptoms have improved today. His cough has mostly resolved. Feels like his breathing is back to his baseline. He's still having some fatigue and weakness, but this is slowly improving. Just not quite back to normal. He denies fevers, chills, hemoptysis, lower extremity swelling, wheezing. He is using Trelegy; feels like this works well for him. Still taking mucinex.   12/06/2022: Today - follow up Patient was in today for intended follow-up.  Unfortunately having some acute symptoms as well.  Over the last week, he has developed a increased productive cough with brown to yellow phlegm.  He also has felt more short winded and has noticed himself wheezing more.  Feels like his chest is a little bit tighter with deep breaths.  He denies any fevers, chills, hemoptysis, lower extremity swelling, orthopnea.  He is eating and drinking well.  He is using his Trelegy inhaler daily and neb treatments 3 times a day.  He is not currently taking Mucinex.  No Known Allergies  Immunization History  Administered Date(s) Administered   Influenza, High Dose Seasonal PF 07/21/2018   Influenza-Unspecified 07/10/2022   PFIZER(Purple Top)SARS-COV-2 Vaccination 12/31/2019, 01/24/2020   Pneumococcal Polysaccharide-23 06/14/2016   Pneumococcal-Unspecified 09/10/2022   Respiratory Syncytial Virus Vaccine,Recomb Aduvanted(Arexvy) 09/10/2022    Past Medical History:  Diagnosis Date  CAD (coronary artery disease) of artery bypass graft 06/13/2016   COPD (chronic obstructive pulmonary disease) ([place])    Depression 09/01/2007   Qualifier: Diagnosis of  By: Danny Lawless CMA, [place]     Elevated troponin 06/13/2016   History of lung cancer 11/02/2020   Hyperlipidemia 11/02/2020   Hypertension     Prolonged QT interval 11/02/2020   Stress-induced cardiomyopathy 06/13/2016    Tobacco History: Social History   Tobacco Use  Smoking Status Former   Packs/day: 0.50   Types: Cigarettes   Quit date: 2019   Years since quitting: 5.1  Smokeless Tobacco Never   Counseling given: Not Answered   Outpatient Medications Prior to Visit  Medication Sig Dispense Refill   albuterol (PROVENTIL) (2.5 MG/3ML) 0.083% nebulizer solution Take 2.5 mg by nebulization 3 (three) times daily as needed for wheezing or shortness of breath.     albuterol (VENTOLIN HFA) 108 (90 Base) MCG/ACT inhaler Inhale 2 puffs into the lungs every 6 (six) hours as needed for wheezing or shortness of breath. 8 g 2   aspirin 81 MG chewable tablet CHEW AND SWALLOW 1 TABLET(81 MG) BY MOUTH DAILY (Patient taking differently: Chew 81 mg by mouth daily at 12 noon.) 30 tablet 2   atorvastatin (LIPITOR) 40 MG tablet TAKE 1 TABLET(40 MG) BY MOUTH DAILY AT 6 PM 90 tablet 3   buPROPion (WELLBUTRIN XL) 300 MG 24 hr tablet Take 300 mg by mouth every morning.  1   citalopram (CELEXA) 40 MG tablet Take 40 mg by mouth every morning.  0   Fluticasone-Umeclidin-Vilant (TRELEGY ELLIPTA) 100-62.5-25 MCG/ACT AEPB Inhale 1 puff into the lungs daily. 28 each 11   Fluticasone-Umeclidin-Vilant (TRELEGY ELLIPTA) 200-62.5-25 MCG/ACT AEPB Inhale 1 puff into the lungs daily. 2 each 0   lisinopril (ZESTRIL) 40 MG tablet TAKE 1 TABLET(40 MG) BY MOUTH DAILY 60 tablet 2   metoprolol succinate (TOPROL-XL) 25 MG 24 hr tablet TAKE 1/2 TABLET(12.5 MG) BY MOUTH DAILY (Patient taking differently: Take 12.5 mg by mouth daily at 12 noon.) 45 tablet 6   doxycycline (VIBRA-TABS) 100 MG tablet Take 1 tablet (100 mg total) by mouth 2 (two) times daily. 14 tablet 0   ezetimibe (ZETIA) 10 MG tablet TAKE 1 TABLET(10 MG) BY MOUTH DAILY (Patient not taking: Reported on 12/06/2022) 90 tablet 3   No facility-administered medications prior to visit.     Review of Systems:    Constitutional: No weight loss or gain, night sweats, fevers, chills. +fatigue, lassitude  HEENT: No headaches, difficulty swallowing, tooth/dental problems, or sore throat. No sneezing, itching, ear ache, nasal congestion, or post nasal drip CV:  No chest pain, orthopnea, PND, swelling in lower extremities, anasarca, dizziness, palpitations, syncope Resp: +shortness of breath with exertion; productive cough; wheezing.  No hemoptysis. No chest wall deformity GI:  No heartburn, indigestion, abdominal pain, nausea, vomiting, diarrhea, change in bowel habits, loss of appetite, bloody stools.  MSK:  No joint pain or swelling.   Neuro: No dizziness or lightheadedness.  Psych: No depression or anxiety. Mood stable.     Physical Exam:  BP 124/72 (BP Location: Right Arm, Patient Position: Sitting, Cuff Size: Normal)   Pulse 81   Temp 97.8 F (36.6 C) (Oral)   Ht 5\' 9"  (1.753 m)   Wt 189 lb 9.6 oz (86 kg)   SpO2 99%   BMI 28.00 kg/m   GEN: Pleasant, interactive, well-kempt; in no acute distress. HEENT:  Normocephalic and atraumatic. PERRLA. Sclera white. Nasal turbinates pink, moist  and patent bilaterally. No rhinorrhea present. Oropharynx pink and moist, without exudate or edema. No lesions, ulcerations, or postnasal drip.  NECK:  Supple w/ fair ROM. No JVD present. Normal carotid impulses w/o bruits. Thyroid symmetrical with no goiter or nodules palpated. No lymphadenopathy.   CV: RRR, no m/r/g, no peripheral edema. Pulses intact, +2 bilaterally. No cyanosis, pallor or clubbing. PULMONARY:  Unlabored, regular breathing.  Scattered wheezes bilaterally A&P.  Congested cough. No accessory muscle use.  GI: BS present and normoactive. Soft, non-tender to palpation. No organomegaly or masses detected. MSK: No erythema, warmth or tenderness. Cap refil <2 sec all extrem. No deformities or joint swelling noted.  Neuro: A/Ox3. No focal deficits noted.   Skin: Warm, no lesions or rashe Psych:  Normal affect and behavior. Judgement and thought content appropriate.     Lab Results:  CBC    Component Value Date/Time   WBC 18.8 (H) 03/27/2022 0331   RBC 4.37 03/27/2022 0331   HGB 13.6 03/27/2022 0331   HCT 41.7 03/27/2022 0331   PLT 273 03/27/2022 0331   MCV 95.4 03/27/2022 0331   MCH 31.1 03/27/2022 0331   MCHC 32.6 03/27/2022 0331   RDW 13.1 03/27/2022 0331   LYMPHSABS 0.7 03/27/2022 0331   MONOABS 1.2 (H) 03/27/2022 0331   EOSABS 0.0 03/27/2022 0331   BASOSABS 0.0 03/27/2022 0331    BMET    Component Value Date/Time   NA 140 03/27/2022 0331   NA 139 01/18/2021 1423   K 4.0 03/27/2022 0331   CL 104 03/27/2022 0331   CO2 29 03/27/2022 0331   GLUCOSE 110 (H) 03/27/2022 0331   BUN 31 (H) 03/27/2022 0331   BUN 14 01/18/2021 1423   CREATININE 1.16 03/27/2022 0331   CREATININE 0.87 07/01/2016 1032   CALCIUM 8.9 03/27/2022 0331   GFRNONAA >60 03/27/2022 0331   GFRAA 57 (L) 12/14/2019 1025    BNP    Component Value Date/Time   BNP 80.6 03/24/2022 1120     Imaging:  No results found.  albuterol (PROVENTIL) (2.5 MG/3ML) 0.083% nebulizer solution 2.5 mg     Date Action Dose Route User   12/06/2022 1152 Given 2.5 mg Nebulization Pinion, Emily P, CMA      methylPREDNISolone acetate (DEPO-MEDROL) injection 80 mg     Date Action Dose Route User   12/06/2022 1152 Given 80 mg Intramuscular (Right Upper Outer Quadrant) Pinion, Waldemar Dickens, CMA          Latest Ref Rng & Units 09/11/2022    9:47 AM  PFT Results  FVC-Pre L 2.96   FVC-Predicted Pre % 70   FVC-Post L 3.22   FVC-Predicted Post % 76   Pre FEV1/FVC % % 54   Post FEV1/FCV % % 57   FEV1-Pre L 1.58   FEV1-Predicted Pre % 52   FEV1-Post L 1.83   DLCO uncorrected ml/min/mmHg 16.42   DLCO UNC% % 65   DLCO corrected ml/min/mmHg 16.42   DLCO COR %Predicted % 65   DLVA Predicted % 78   TLC L 6.78   TLC % Predicted % 96   RV % Predicted % 132     No results found for:  "NITRICOXIDE"      Assessment & Plan:   COPD exacerbation ([place]) AECOPD with increased dyspnea and productive cough.  Treated with Depo 80 mg injection x 1 and albuterol neb in office today.  Improvement in airflow post neb.  Will treat him with prednisone taper and empiric  7-day course of doxycycline.  Hold off on imaging today.  Advised him that if symptoms did not improve or he develop fevers, we would obtain chest x-ray.  He will continue triple therapy regimen and neb treatments for bronchodilator therapy.  Restart therapy for mucociliary clearance.  Strict ED/return precautions. This is his third exacerbation requiring antibiotics and steroids over the past 6 to 8 months.  He was hospitalized in June for AECOPD.  He was then treated again in November outpatient.  Recommended that we start him on Daliresp at follow-up, as long as symptoms have improved.  Will plan to check CMET at the time to evaluate liver function.  Medication education provided today.  Patient Instructions  Continue Albuterol inhaler 2 puffs or 3 mL neb every 6 hours as needed for shortness of breath or wheezing. Notify if symptoms persist despite rescue inhaler/neb use. Use your neb treatments at least twice daily until symptoms improve  Continue Trelegy 1 puff daily. Brush tongue and rinse mouth afterwards.    Prednisone taper. 4 tabs for 2 days, then 3 tabs for 2 days, 2 tabs for 2 days, then 1 tab for 2 days, then stop. Take in AM with food Doxycycline 1 tab Twice daily for 7 days. Take with food. Wear sunscreen or avoid excessive sun exposure as medication increases your risk for sunburn Guaifenesin [phone] Twice daily for chest congestion  We will probably start you on a medication called Daliresp when you return in 2 weeks to help prevent/reduce future COPD flares    Follow up in 2 weeks with Dr. Erin Fulling or Katie Ulonda Klosowski,NP. If symptoms do not improve or worsen, please contact office for sooner follow up or seek  emergency care.   I spent 38 minutes of dedicated to the care of this patient on the date of this encounter to include pre-visit review of records, face-to-face time with the patient discussing conditions above, post visit ordering of testing, clinical documentation with the electronic health record, making appropriate referrals as documented, and communicating necessary findings to members of the patients care team.  Clayton Bibles, NP 12/06/2022  Pt aware and understands NP's role.

## 2022-12-06 NOTE — Assessment & Plan Note (Signed)
AECOPD with increased dyspnea and productive cough.  Treated with Depo 80 mg injection x 1 and albuterol neb in office today.  Improvement in airflow post neb.  Will treat him with prednisone taper and empiric 7-day course of doxycycline.  Hold off on imaging today.  Advised him that if symptoms did not improve or he develop fevers, we would obtain chest x-ray.  He will continue triple therapy regimen and neb treatments for bronchodilator therapy.  Restart therapy for mucociliary clearance.  Strict ED/return precautions. This is his third exacerbation requiring antibiotics and steroids over the past 6 to 8 months.  He was hospitalized in June for AECOPD.  He was then treated again in November outpatient.  Recommended that we start him on Daliresp at follow-up, as long as symptoms have improved.  Will plan to check CMET at the time to evaluate liver function.  Medication education provided today.  Patient Instructions  Continue Albuterol inhaler 2 puffs or 3 mL neb every 6 hours as needed for shortness of breath or wheezing. Notify if symptoms persist despite rescue inhaler/neb use. Use your neb treatments at least twice daily until symptoms improve  Continue Trelegy 1 puff daily. Brush tongue and rinse mouth afterwards.    Prednisone taper. 4 tabs for 2 days, then 3 tabs for 2 days, 2 tabs for 2 days, then 1 tab for 2 days, then stop. Take in AM with food Doxycycline 1 tab Twice daily for 7 days. Take with food. Wear sunscreen or avoid excessive sun exposure as medication increases your risk for sunburn Guaifenesin [phone] mg Twice daily for chest congestion  We will probably start you on a medication called Daliresp when you return in 2 weeks to help prevent/reduce future COPD flares    Follow up in 2 weeks with Dr. Erin Fulling or Katie Serita Degroote,NP. If symptoms do not improve or worsen, please contact office for sooner follow up or seek emergency care.

## 2022-12-11 LAB — LIPID PANEL
Chol/HDL Ratio: 2 ratio (ref 0.0–5.0)
Cholesterol, Total: 150 mg/dL (ref 100–199)
HDL: 75 mg/dL (ref >39)
LDL Chol Calc (NIH): 58 mg/dL (ref 0–99)
Triglycerides: 91 mg/dL (ref 0–149)
VLDL Cholesterol Cal: 17 mg/dL (ref 5–40)

## 2022-12-11 LAB — HEPATIC FUNCTION PANEL
ALT: 26 IU/L (ref 0–44)
AST: 15 IU/L (ref 0–40)
Albumin: 4.5 g/dL (ref 3.8–4.8)
Alkaline Phosphatase: 89 IU/L (ref 44–121)
Bilirubin Total: 0.4 mg/dL (ref 0.0–1.2)
Bilirubin, Direct: 0.13 mg/dL (ref 0.00–0.40)
Total Protein: 6.5 g/dL (ref 6.0–8.5)

## 2022-12-13 ENCOUNTER — Encounter: Payer: Self-pay | Admitting: *Deleted

## 2022-12-20 ENCOUNTER — Ambulatory Visit: Payer: Medicare Other | Admitting: Nurse Practitioner

## 2022-12-20 ENCOUNTER — Encounter: Payer: Self-pay | Admitting: Nurse Practitioner

## 2022-12-20 VITALS — BP 112/64 | HR 84 | Ht 70.0 in | Wt 187.0 lb

## 2022-12-20 DIAGNOSIS — J4489 Other specified chronic obstructive pulmonary disease: Secondary | ICD-10-CM

## 2022-12-20 DIAGNOSIS — J439 Emphysema, unspecified: Secondary | ICD-10-CM

## 2022-12-20 DIAGNOSIS — J449 Chronic obstructive pulmonary disease, unspecified: Secondary | ICD-10-CM

## 2022-12-20 LAB — BASIC METABOLIC PANEL
BUN: 25 mg/dL — ABNORMAL HIGH (ref 6–23)
CO2: 29 mEq/L (ref 19–32)
Calcium: 9.6 mg/dL (ref 8.4–10.5)
Chloride: 102 mEq/L (ref 96–112)
Creatinine, Ser: 1.52 mg/dL — ABNORMAL HIGH (ref 0.40–1.50)
GFR: 44.28 mL/min — ABNORMAL LOW (ref >60.00)
Glucose, Bld: 117 mg/dL — ABNORMAL HIGH (ref 70–99)
Potassium: 4.9 mEq/L (ref 3.5–5.1)
Sodium: 138 mEq/L (ref 135–145)

## 2022-12-20 NOTE — Progress Notes (Signed)
$'@Patient'C$  ID: Gary Wright, male    DOB: [birth year], 77 y.o.   MRN: [id number]  Chief Complaint  Patient presents with   Follow-up    Pt f/u states that he is feeling slightly better still has a productive cough w/ whitish brown mucous. Finished prednisone and doxycycline from [place]    Referring provider: Leighton Ruff  HPI: 77 year old male, former smoker followed for COPD.  He is a patient of Dr. August Albino and last seen in office 12/06/2021 by [facility] NP.  Past medical history significant for CAD, hypertension, HLD.  He does have a history of lung cancer status post left upper lobectomy 10/30/2018.  TEST/EVENTS:  05/2022 ONO: 1 minute 37 seconds less than 88% 09/11/2022 PFT: FVC 70, FEV1 52, ratio 57, TLC 96, DLCOunc 65.  Moderately severe obstructive disease with reversibility (15% improvement) 09/11/2022 CXR: Postop change from left upper lobectomy.  No acute process.  05/23/2022: OV with Dr. Erin Fulling.  Admitted 6/4 to 6/7 for COPD exacerbation.  He has had ongoing dyspnea.  Does have some wheezing intermittently.  Has a cough with some phlegm, light brown in color.  Does have oxygen equipment at home but is not using it.  Symptoms are his baseline.  He will continue on Trelegy and as needed [place].  Order Joselyn Arrow.  Referred to lung cancer screening team.  Scheduled pulmonary function testing in 3 months.  09/11/2022: Ov with Miko Sirico NP for follow-up after undergoing pulmonary function testing which revealed moderately severe obstructive defect with reversibility and moderate diffusion defect.  He has been doing okay since he was here last.  He does feel like the Trelegy helped him; having some trouble with insurance coverage.  He plans to switch the beginning of the year and was told that it should only cost $58 a month.  Unfortunately, he has been having some acute symptoms over the last 2 weeks.  Has had an increased productive cough with brown sputum, more wheezing and feels little more short of  breath.  Also having some chest congestion.  Denies any hemoptysis, fevers, chills, night sweats, lower extremity swelling.  No interim sick exposures.  No URI symptoms.  Feeling a little more fatigued and weak. Eating and drinking well. Not using any over-the-counter cough syrups. Treated for AECOPD with prednisone and empiric doxycycline.  09/25/2022: OV with Hermela Hardt NP for follow up after being treated for AECOPD. Symptoms have improved today. His cough has mostly resolved. Feels like his breathing is back to his baseline. He's still having some fatigue and weakness, but this is slowly improving. Just not quite back to normal. He denies fevers, chills, hemoptysis, lower extremity swelling, wheezing. He is using Trelegy; feels like this works well for him. Still taking mucinex.   12/06/2022: OV with Iesha Summerhill NP for intended follow-up.  Unfortunately having some acute symptoms as well.  Over the last week, he has developed a increased productive cough with brown to yellow phlegm.  He also has felt more short winded and has noticed himself wheezing more.  Feels like his chest is a little bit tighter with deep breaths.  He denies any fevers, chills, hemoptysis, lower extremity swelling, orthopnea.  He is eating and drinking well.  He is using his Trelegy inhaler daily and neb treatments 3 times a day.  He is not currently taking Mucinex. Treated with prednisone taper and doxycyline course.  12/20/2022: Today - follow up Patient presents today for follow up after being treated for AECOPD. He has had multiple  exacerbations over the past 6-8 months. He was hospitalized in June for AECOPD, treated outpatient in November and again in February. Feeling better today. Cough has cleared up and minimally productive; occasionally still having some white to tan phlegm. Breathing feels back to his baseline. No more chest congestion or wheezing. Using his Trelegy daily. Decision was made at his last OV to start him on daliresp once  he improves.   No Known Allergies  Immunization History  Administered Date(s) Administered   Influenza, High Dose Seasonal PF 07/21/2018   Influenza-Unspecified 07/10/2022   PFIZER(Purple Top)SARS-COV-2 Vaccination 12/31/2019, 01/24/2020   Pneumococcal Polysaccharide-23 06/14/2016   Pneumococcal-Unspecified 09/10/2022   Respiratory Syncytial Virus Vaccine,Recomb Aduvanted(Arexvy) 09/10/2022    Past Medical History:  Diagnosis Date   CAD (coronary artery disease) of artery bypass graft 06/13/2016   COPD (chronic obstructive pulmonary disease) ([place])    Depression 09/01/2007   Qualifier: Diagnosis of  By: Danny Lawless CMA, [place]     Elevated troponin 06/13/2016   History of lung cancer 11/02/2020   Hyperlipidemia 11/02/2020   Hypertension    Prolonged QT interval 11/02/2020   Stress-induced cardiomyopathy 06/13/2016    Tobacco History: Social History   Tobacco Use  Smoking Status Former   Packs/day: 0.50   Types: Cigarettes   Quit date: 2019   Years since quitting: 5.1  Smokeless Tobacco Never   Counseling given: Not Answered   Outpatient Medications Prior to Visit  Medication Sig Dispense Refill   albuterol (PROVENTIL) (2.5 MG/3ML) 0.083% nebulizer solution Take 2.5 mg by nebulization 3 (three) times daily as needed for wheezing or shortness of breath.     albuterol (VENTOLIN HFA) 108 (90 Base) MCG/ACT inhaler Inhale 2 puffs into the lungs every 6 (six) hours as needed for wheezing or shortness of breath. 8 g 2   aspirin 81 MG chewable tablet CHEW AND SWALLOW 1 TABLET(81 MG) BY MOUTH DAILY (Patient taking differently: Chew 81 mg by mouth daily at 12 noon.) 30 tablet 2   atorvastatin (LIPITOR) 40 MG tablet TAKE 1 TABLET(40 MG) BY MOUTH DAILY AT 6 PM 90 tablet 3   buPROPion (WELLBUTRIN XL) 300 MG 24 hr tablet Take 300 mg by mouth every morning.  1   citalopram (CELEXA) 40 MG tablet Take 40 mg by mouth every morning.  0   ezetimibe (ZETIA) 10 MG tablet TAKE 1 TABLET(10 MG) BY MOUTH  DAILY 90 tablet 3   Fluticasone-Umeclidin-Vilant (TRELEGY ELLIPTA) 100-62.5-25 MCG/ACT AEPB Inhale 1 puff into the lungs daily. 28 each 11   lisinopril (ZESTRIL) 40 MG tablet TAKE 1 TABLET(40 MG) BY MOUTH DAILY 60 tablet 2   metoprolol succinate (TOPROL-XL) 25 MG 24 hr tablet TAKE 1/2 TABLET(12.5 MG) BY MOUTH DAILY (Patient taking differently: Take 12.5 mg by mouth daily at 12 noon.) 45 tablet 6   Fluticasone-Umeclidin-Vilant (TRELEGY ELLIPTA) 200-62.5-25 MCG/ACT AEPB Inhale 1 puff into the lungs daily. 2 each 0   predniSONE (DELTASONE) 10 MG tablet 4 tabs for 2 days, then 3 tabs for 2 days, 2 tabs for 2 days, then 1 tab for 2 days, then stop 20 tablet 0   No facility-administered medications prior to visit.     Review of Systems:   Constitutional: No weight loss or gain, night sweats, fevers, chills, fatigue, lassitude  HEENT: No headaches, difficulty swallowing, tooth/dental problems, or sore throat. No sneezing, itching, ear ache, nasal congestion, or post nasal drip CV:  No chest pain, orthopnea, PND, swelling in lower extremities, anasarca, dizziness, palpitations, syncope Resp: +  shortness of breath with exertion (improved); improved productive cough. No wheezing.  No hemoptysis. No chest wall deformity GI:  No heartburn, indigestion, abdominal pain, nausea, vomiting, diarrhea, change in bowel habits, loss of appetite, bloody stools.  MSK:  No joint pain or swelling.   Neuro: No dizziness or lightheadedness.  Psych: No depression or anxiety. Mood stable.     Physical Exam:  BP 112/64   Pulse 84   Ht '5\' 10"'$  (1.778 m)   Wt 187 lb (84.8 kg)   SpO2 97%   BMI 26.83 kg/m   GEN: Pleasant, interactive, well-kempt; in no acute distress. HEENT:  Normocephalic and atraumatic. PERRLA. Sclera white. Nasal turbinates pink, moist and patent bilaterally. No rhinorrhea present. Oropharynx pink and moist, without exudate or edema. No lesions, ulcerations, or postnasal drip.  NECK:  Supple w/  fair ROM. No JVD present. Normal carotid impulses w/o bruits. Thyroid symmetrical with no goiter or nodules palpated. No lymphadenopathy.   CV: RRR, no m/r/g, no peripheral edema. Pulses intact, +2 bilaterally. No cyanosis, pallor or clubbing. PULMONARY:  Unlabored, regular breathing. Clear bilaterally A&P w/o wheezes/rales/rhonchi. No accessory muscle use.  GI: BS present and normoactive. Soft, non-tender to palpation. No organomegaly or masses detected. MSK: No erythema, warmth or tenderness. Cap refil <2 sec all extrem. No deformities or joint swelling noted.  Neuro: A/Ox3. No focal deficits noted.   Skin: Warm, no lesions or rashe Psych: Normal affect and behavior. Judgement and thought content appropriate.     Lab Results:  CBC    Component Value Date/Time   WBC 18.8 (H) 03/27/2022 0331   RBC 4.37 03/27/2022 0331   HGB 13.6 03/27/2022 0331   HCT 41.7 03/27/2022 0331   PLT 273 03/27/2022 0331   MCV 95.4 03/27/2022 0331   MCH 31.1 03/27/2022 0331   MCHC 32.6 03/27/2022 0331   RDW 13.1 03/27/2022 0331   LYMPHSABS 0.7 03/27/2022 0331   MONOABS 1.2 (H) 03/27/2022 0331   EOSABS 0.0 03/27/2022 0331   BASOSABS 0.0 03/27/2022 0331    BMET    Component Value Date/Time   NA 138 12/20/2022 1445   NA 139 01/18/2021 1423   K 4.9 12/20/2022 1445   CL 102 12/20/2022 1445   CO2 29 12/20/2022 1445   GLUCOSE 117 (H) 12/20/2022 1445   BUN 25 (H) 12/20/2022 1445   BUN 14 01/18/2021 1423   CREATININE 1.52 (H) 12/20/2022 1445   CREATININE 0.87 07/01/2016 1032   CALCIUM 9.6 12/20/2022 1445   GFRNONAA >60 03/27/2022 0331   GFRAA 57 (L) 12/14/2019 1025    BNP    Component Value Date/Time   BNP 80.6 03/24/2022 1120     Imaging:  No results found.  albuterol (PROVENTIL) (2.5 MG/3ML) 0.083% nebulizer solution 2.5 mg     Date Action Dose Route User   12/06/2022 1152 Given 2.5 mg Nebulization Pinion, Emily P, CMA      methylPREDNISolone acetate (DEPO-MEDROL) injection 80 mg      Date Action Dose Route User   12/06/2022 1152 Given 80 mg Intramuscular (Right Upper Outer Quadrant) Pinion, Waldemar Dickens, CMA          Latest Ref Rng & Units 09/11/2022    9:47 AM  PFT Results  FVC-Pre L 2.96   FVC-Predicted Pre % 70   FVC-Post L 3.22   FVC-Predicted Post % 76   Pre FEV1/FVC % % 54   Post FEV1/FCV % % 57   FEV1-Pre L 1.58   FEV1-Predicted Pre %  52   FEV1-Post L 1.83   DLCO uncorrected ml/min/mmHg 16.42   DLCO UNC% % 65   DLCO corrected ml/min/mmHg 16.42   DLCO COR %Predicted % 65   DLVA Predicted % 78   TLC L 6.78   TLC % Predicted % 96   RV % Predicted % 132     No results found for: "NITRICOXIDE"      Assessment & Plan:   COPD with chronic bronchitis and emphysema (HCC) Severe COPD prone to frequent exacerbations. Most recent AECOPD resolve. We will start him on daliresp with goal of decreasing future flares. Medication education provided today. Check BMET, LFTs today. Recheck in one month. Continue triple therapy regimen. Action plan in place.   Patient Instructions  Continue Albuterol inhaler 2 puffs or 3 mL neb every 6 hours as needed for shortness of breath or wheezing. Notify if symptoms persist despite rescue inhaler/neb use. Use your neb treatments at least twice daily until symptoms improve  Continue Trelegy 1 puff daily. Brush tongue and rinse mouth afterwards.  Continue Guaifenesin [phone] mg Twice daily for chest congestion   Start daliresp 250 mcg daily for 4 weeks then increase to 500 mcg daily  Labs - BMET   Follow up in 6 weeks with Dr. Erin Fulling (1st) or Katie Rorey Hodges,NP and repeat labs. If symptoms do not improve or worsen, please contact office for sooner follow up or seek emergency care.   I spent 35 minutes of dedicated to the care of this patient on the date of this encounter to include pre-visit review of records, face-to-face time with the patient discussing conditions above, post visit ordering of testing, clinical  documentation with the electronic health record, making appropriate referrals as documented, and communicating necessary findings to members of the patients care team.  Clayton Bibles, NP 12/20/2022  Pt aware and understands NP's role.

## 2022-12-20 NOTE — Patient Instructions (Addendum)
Continue Albuterol inhaler 2 puffs or 3 mL neb every 6 hours as needed for shortness of breath or wheezing. Notify if symptoms persist despite rescue inhaler/neb use. Use your neb treatments at least twice daily until symptoms improve  Continue Trelegy 1 puff daily. Brush tongue and rinse mouth afterwards.  Continue Guaifenesin [phone] mg Twice daily for chest congestion   Start daliresp 250 mcg daily for 4 weeks then increase to 500 mcg daily  Labs - BMET   Follow up in 6 weeks with Dr. Erin Fulling (1st) or Katie Jasmynn Pfalzgraf,NP and repeat labs. If symptoms do not improve or worsen, please contact office for sooner follow up or seek emergency care.

## 2022-12-20 NOTE — Assessment & Plan Note (Signed)
Severe COPD prone to frequent exacerbations. Most recent AECOPD resolve. We will start him on daliresp with goal of decreasing future flares. Medication education provided today. Check BMET, LFTs today. Recheck in one month. Continue triple therapy regimen. Action plan in place.   Patient Instructions  Continue Albuterol inhaler 2 puffs or 3 mL neb every 6 hours as needed for shortness of breath or wheezing. Notify if symptoms persist despite rescue inhaler/neb use. Use your neb treatments at least twice daily until symptoms improve  Continue Trelegy 1 puff daily. Brush tongue and rinse mouth afterwards.  Continue Guaifenesin [phone] mg Twice daily for chest congestion   Start daliresp 250 mcg daily for 4 weeks then increase to 500 mcg daily  Labs - BMET   Follow up in 6 weeks with Dr. Erin Fulling (1st) or Katie Bryam Taborda,NP and repeat labs. If symptoms do not improve or worsen, please contact office for sooner follow up or seek emergency care.

## 2022-12-24 ENCOUNTER — Telehealth: Payer: Self-pay | Admitting: Nurse Practitioner

## 2022-12-24 DIAGNOSIS — J449 Chronic obstructive pulmonary disease, unspecified: Secondary | ICD-10-CM

## 2022-12-24 NOTE — Telephone Encounter (Signed)
Called and spoke w/ pt he verbalized that the [place] is not covered on his insurance, and the generic was $200/month. He reports that he is not able to afford that monthly, he is also concerned that one of the side effects is depression and he is already on an anti-depressant.   Katie please advise next steps or further recommendations?

## 2022-12-24 NOTE — Telephone Encounter (Signed)
PT calling because both the generic and name brand are too much $ for him. Please call PT to advise @ [phone]   AVS Notes: Start daliresp 250 mcg daily for 4 weeks then increase to 500 mcg daily  Pharm is [facility] on [address] and [address]

## 2022-12-25 ENCOUNTER — Ambulatory Visit: Payer: Medicare Other | Admitting: Nurse Practitioner

## 2022-12-26 NOTE — Telephone Encounter (Signed)
Attempted to contact pt to discuss concerns regarding side effect profile. We can apply for patient assistance for the Daliresp if it is expensive and he is wanting to try it. Advised him to call back.

## 2022-12-26 NOTE — Telephone Encounter (Signed)
Ret out call. Please call again. [phone]

## 2022-12-27 ENCOUNTER — Other Ambulatory Visit (INDEPENDENT_AMBULATORY_CARE_PROVIDER_SITE_OTHER): Payer: Medicare Other

## 2022-12-27 ENCOUNTER — Telehealth: Payer: Self-pay | Admitting: Nurse Practitioner

## 2022-12-27 DIAGNOSIS — J449 Chronic obstructive pulmonary disease, unspecified: Secondary | ICD-10-CM

## 2022-12-27 LAB — COMPREHENSIVE METABOLIC PANEL
ALT: 22 U/L (ref 0–53)
AST: 15 U/L (ref 0–37)
Albumin: 4.1 g/dL (ref 3.5–5.2)
Alkaline Phosphatase: 68 U/L (ref 39–117)
BUN: 22 mg/dL (ref 6–23)
CO2: 28 mEq/L (ref 19–32)
Calcium: 9.9 mg/dL (ref 8.4–10.5)
Chloride: 103 mEq/L (ref 96–112)
Creatinine, Ser: 1.59 mg/dL — ABNORMAL HIGH (ref 0.40–1.50)
GFR: 41.95 mL/min — ABNORMAL LOW (ref >60.00)
Glucose, Bld: 68 mg/dL — ABNORMAL LOW (ref 70–99)
Potassium: 4.9 mEq/L (ref 3.5–5.1)
Sodium: 140 mEq/L (ref 135–145)
Total Bilirubin: 0.4 mg/dL (ref 0.2–1.2)
Total Protein: 6.7 g/dL (ref 6.0–8.3)

## 2022-12-27 MED ORDER — ROFLUMILAST 250 MCG PO TABS: 1.0000 | ORAL_TABLET | Freq: Every day | ORAL | 0 refills | Status: DC

## 2022-12-27 MED ORDER — ROFLUMILAST 500 MCG PO TABS: 500.0000 ug | ORAL_TABLET | Freq: Every day | ORAL | 11 refills | Status: DC

## 2022-12-27 MED ORDER — TRELEGY ELLIPTA 100-62.5-25 MCG/ACT IN AEPB: 1.0000 | INHALATION_SPRAY | Freq: Every day | RESPIRATORY_TRACT | 11 refills | Status: DC

## 2022-12-27 NOTE — Telephone Encounter (Signed)
Tried to locate Daliresp on [place] list but it was not popping up. Checked with Cherina and she said that the patient assistance for Daliresp was discontinued sometime last year.

## 2022-12-27 NOTE — Telephone Encounter (Signed)
Called and spoke with pt letting him know the info per Joellen Jersey and he verbalized understanding. Rx for pt's Trelegy has been sent to [facility] and pt said he would come by office today 3/8 to have labwork done and to pick up savings card for GoodRx.  Savings card has been printed for pt and placed with Irma in lab.

## 2022-12-27 NOTE — Telephone Encounter (Signed)
Looks like [employer] has coupon codes that will get the cost down to around $26 a month at [employer]. If this works for him, we can change the pharmacy to any local [facility], print off the coupon and he can come pick it up. He still needs LFTs before starting. Looks like these weren't collected at his last visit; only the BMET. He also had a decline in his kidney function compared to his baseline. We can redraw with a CMET when he comes in to get the coupon. Thanks.

## 2022-12-27 NOTE — Telephone Encounter (Signed)
Spoke with patient regarding side effect profile. He understands that he will need to monitor his mood and notify/stop if he develops worsening depression, anxiety, or mood changes. He would like to move forward with the medication, if he can get it at a better price point. Please provide him with the patient assistance paperwork for this and have him return so we can fax it for him. Thanks.

## 2022-12-27 NOTE — Telephone Encounter (Signed)
12/27/2022: Notified pt of hypoglycemia and worsening kidney function on CMET (creatinine 1.59 from 1.56). He tells me he only ate a small blueberry yogurt this morning and had yet to have lunch. He is not a diabetic and is not on any insulin or antihyperglycemics. He does not have a way to measure his BS at home. Not currently symptomatic. Reviewed ED precautions over the weekend. Advised him to have a high carbohydrate drink now and eat a well balanced meal. Verbalized understanding. Will recheck Monday. Advised him to contact his PCP next week for further management of his CKD and to monitor his hypoglycemia.

## 2022-12-27 NOTE — Addendum Note (Signed)
Addended by: Clayton Bibles on: 12/27/2022 01:33 PM   Modules accepted: Orders

## 2022-12-27 NOTE — Telephone Encounter (Signed)
Routing back to [place] for her to try to reach back out to pt when able.

## 2022-12-30 ENCOUNTER — Other Ambulatory Visit: Payer: Self-pay

## 2022-12-30 ENCOUNTER — Other Ambulatory Visit (INDEPENDENT_AMBULATORY_CARE_PROVIDER_SITE_OTHER): Payer: Medicare Other

## 2022-12-30 DIAGNOSIS — J439 Emphysema, unspecified: Secondary | ICD-10-CM

## 2022-12-30 DIAGNOSIS — J4489 Other specified chronic obstructive pulmonary disease: Secondary | ICD-10-CM | POA: Diagnosis not present

## 2022-12-30 LAB — COMPREHENSIVE METABOLIC PANEL
ALT: 23 U/L (ref 0–53)
AST: 16 U/L (ref 0–37)
Albumin: 4.1 g/dL (ref 3.5–5.2)
Alkaline Phosphatase: 70 U/L (ref 39–117)
BUN: 22 mg/dL (ref 6–23)
CO2: 29 mEq/L (ref 19–32)
Calcium: 9.2 mg/dL (ref 8.4–10.5)
Chloride: 102 mEq/L (ref 96–112)
Creatinine, Ser: 1.45 mg/dL (ref 0.40–1.50)
GFR: 46.85 mL/min — ABNORMAL LOW (ref >60.00)
Glucose, Bld: 127 mg/dL — ABNORMAL HIGH (ref 70–99)
Potassium: 4.5 mEq/L (ref 3.5–5.1)
Sodium: 137 mEq/L (ref 135–145)
Total Bilirubin: 0.4 mg/dL (ref 0.2–1.2)
Total Protein: 6.2 g/dL (ref 6.0–8.3)

## 2023-01-18 ENCOUNTER — Other Ambulatory Visit: Payer: Self-pay | Admitting: Cardiology

## 2023-01-20 ENCOUNTER — Ambulatory Visit: Payer: Medicare Other | Admitting: Family

## 2023-01-29 ENCOUNTER — Encounter: Payer: Self-pay | Admitting: Family

## 2023-01-29 ENCOUNTER — Ambulatory Visit (INDEPENDENT_AMBULATORY_CARE_PROVIDER_SITE_OTHER): Payer: Medicare Other | Admitting: Family

## 2023-01-29 VITALS — BP 103/69 | HR 88 | Temp 97.8°F | Ht 64.0 in | Wt 184.0 lb

## 2023-01-29 DIAGNOSIS — J4489 Other specified chronic obstructive pulmonary disease: Secondary | ICD-10-CM

## 2023-01-29 DIAGNOSIS — J439 Emphysema, unspecified: Secondary | ICD-10-CM | POA: Diagnosis not present

## 2023-01-29 DIAGNOSIS — R7303 Prediabetes: Secondary | ICD-10-CM

## 2023-01-29 DIAGNOSIS — I10 Essential (primary) hypertension: Secondary | ICD-10-CM

## 2023-01-29 LAB — POCT GLYCOSYLATED HEMOGLOBIN (HGB A1C): Hemoglobin A1C: 6.1 % — AB (ref 4.0–5.6)

## 2023-01-29 NOTE — Patient Instructions (Addendum)
Welcome to [facility], It was a pleasure meeting you today!    Your A1C today is 6.1, this is considered borderline diabetes. You do not need medication for this, but it is important to continue to work on weight loss, eating a low carbohydrate/sweets diet, and drinking plenty of water. See the attached handout for more information.  Schedule a follow up visit for 6 months or sooner if any concerns.     PLEASE NOTE: If you had any LAB tests please let us know if you have not heard back within a few days. You may see your results on MyChart before we have a chance to review them but we will give you a call once they are reviewed by [place]. If we ordered any REFERRALS today, please let us know if you have not heard from their office within the next week.  Let us know through MyChart if you are needing REFILLS, or have your pharmacy send [place] the request. You can also use MyChart to communicate with me or any office staff.  Please try these tips to maintain a healthy lifestyle: It is important that you exercise regularly at least 30 minutes 5 times a week. Think about what you will eat, plan ahead. Choose whole foods, & think  "clean, green, fresh or frozen" over canned, processed or packaged foods which are more sugary, salty, and fatty. 70 to 75% of food eaten should be fresh vegetables and protein. 2-3  meals daily with healthy snacks between meals, but must be whole fruit, protein or vegetables. Aim to eat over a 10 hour period when you are active, for example, 7am to 5pm, and then STOP after your last meal of the day, drinking only water.  Shorter eating windows, 6-8 hours, are showing benefits in heart disease and blood sugar regulation. Drink water every day! Shoot for 64 ounces daily = 8 cups, no other drink is as healthy! Fruit juice is best enjoyed in a healthy way, by EATING the fruit.

## 2023-01-29 NOTE — Assessment & Plan Note (Signed)
followed by Pulmonology for refills taking Daliresp, albuerol via nebulizer bid, Trelegy qd

## 2023-01-29 NOTE — Progress Notes (Unsigned)
New Patient Office Visit  Subjective:  Patient ID: Gary Wright, male    DOB: August 01, 1946  Age: 77 y.o. MRN: [id number]  CC:  Chief Complaint  Patient presents with   New Patient (Initial Visit)    Previous PCP retired.   Diabetes    Sugar was high at pulmonary appointment.    Hypertension    HPI Gary Wright presents for establishing care today.  Hx of heart disease: had stress-induced Cardiomyopathy (Takotsubo) back in 2017, also has HTN, EMR indicates CAD, pt reports catheterization but no blockages found. Pt on Lisinopril, Metoprolol  COPD:  pt hx of smoking, no longer, dx 14 yrs ago. Seeing Pulmonology regularly and is on Trelegy qd and albuterol w/nebulizer bid, and rescue inhaler. Hx of CAP, resp failure.   Elevated blood sugar: recently had labs done thru Pulmonology office and glucose elevated x 2, last reading 127,  pt unsure if he was fasting. Denies any polyphagia, uria or dipsia. Pt has lost a few lbs over last month. Has been drinking more lemon juice mixed with water several times per day as it has helped him lose weight.   Assessment & Plan:  Borderline type 2 diabetes mellitus Assessment & Plan: New elevated glucose levels w/pulmonology labs A1C today 6.1 advised pt on low carb/low sugar diet, increased water intake, drinking lemon water to lose weight, advised to reduce to 1 glass qd increase exercise where able will continue to monitor f/u 6 mos  Orders: -     POCT glycosylated hemoglobin (Hb A1C)  COPD with chronic bronchitis and emphysema Assessment & Plan: followed by Pulmonology for refills taking Daliresp, albuerol via nebulizer bid, Trelegy qd   Essential hypertension Assessment & Plan: chronic taking Lisinopril, Metoprolol followed by Cardiology for refills    Subjective:    Outpatient Medications Prior to Visit  Medication Sig Dispense Refill   albuterol (PROVENTIL) (2.5 MG/3ML) 0.083% nebulizer solution Take 2.5 mg by  nebulization 3 (three) times daily as needed for wheezing or shortness of breath.     albuterol (VENTOLIN HFA) 108 (90 Base) MCG/ACT inhaler Inhale 2 puffs into the lungs every 6 (six) hours as needed for wheezing or shortness of breath. 8 g 2   aspirin 81 MG chewable tablet CHEW AND SWALLOW 1 TABLET(81 MG) BY MOUTH DAILY (Patient taking differently: Chew 81 mg by mouth daily at 12 noon.) 30 tablet 2   atorvastatin (LIPITOR) 40 MG tablet TAKE 1 TABLET(40 MG) BY MOUTH DAILY AT 6 PM 90 tablet 3   buPROPion (WELLBUTRIN XL) 300 MG 24 hr tablet Take 300 mg by mouth every morning.  1   citalopram (CELEXA) 40 MG tablet Take 40 mg by mouth every morning.  0   ezetimibe (ZETIA) 10 MG tablet TAKE 1 TABLET(10 MG) BY MOUTH DAILY 90 tablet 3   lisinopril (ZESTRIL) 40 MG tablet TAKE 1 TABLET(40 MG) BY MOUTH DAILY 60 tablet 2   metoprolol succinate (TOPROL-XL) 25 MG 24 hr tablet TAKE 1/2 TABLET(12.5 MG) BY MOUTH DAILY 45 tablet 6   roflumilast (DALIRESP) 500 MCG TABS tablet Take 1 tablet (500 mcg total) by mouth daily. 30 tablet 11   Roflumilast (DALIRESP) 250 MCG TABS Take 1 tablet by mouth daily. 28 tablet 0   No facility-administered medications prior to visit.   Past Medical History:  Diagnosis Date   Acute dyspnea 03/24/2022   Acute exacerbation of chronic obstructive pulmonary disease (COPD) 03/25/2022   CAD (coronary artery disease) of artery bypass graft 06/13/2016  Chest pain    COPD (chronic obstructive pulmonary disease)    Depression 09/01/2007   Qualifier: Diagnosis of  By: Genelle Gather CMA, [place]     Elevated troponin 06/13/2016   History of lung cancer 11/02/2020   Hyperlipidemia 11/02/2020   Hypertension    Prolonged QT interval 11/02/2020   Stress-induced cardiomyopathy 06/13/2016   Past Surgical History:  Procedure Laterality Date   BACK SURGERY     fusion in 1973   CARDIAC CATHETERIZATION N/A 06/13/2016   Procedure: Left Heart Cath and Coronary Angiography;  Surgeon: Yvonne Kendall, MD;  Location: [facility] INVASIVE CV LAB;  Service: Cardiovascular;  Laterality: N/A;   INGUINAL HERNIA REPAIR Right 04/21/2018   Procedure: RIGHT INGUINAL HERNIA REPAIR ERAS PATHWAY;  Surgeon: Harriette Bouillon, MD;  Location: [facility] OR;  Service: General;  Laterality: Right;  TAP BLOCK   INSERTION OF MESH Right 04/21/2018   Procedure: INSERTION OF MESH;  Surgeon: Harriette Bouillon, MD;  Location: [facility] OR;  Service: General;  Laterality: Right;   TONSILLECTOMY      Objective:   Today's Vitals: BP 103/69 (BP Location: Left Arm, Patient Position: Sitting, Cuff Size: Large)   Pulse 88   Temp 97.8 F (36.6 C) (Temporal)   Ht 5\' 4"  (1.626 m)   Wt 184 lb (83.5 kg)   SpO2 99%   BMI 31.58 kg/m   Physical Exam Vitals and nursing note reviewed.  Constitutional:      General: He is not in acute distress.    Appearance: Normal appearance. He is obese.  HENT:     Head: Normocephalic.  Cardiovascular:     Rate and Rhythm: Normal rate and regular rhythm.  Pulmonary:     Effort: Pulmonary effort is normal.     Breath sounds: Normal breath sounds.  Musculoskeletal:        General: Normal range of motion.     Cervical back: Normal range of motion.  Skin:    General: Skin is warm and dry.  Neurological:     Mental Status: He is alert and oriented to person, place, and time.     Motor: Weakness (walks w/cane) present.  Psychiatric:        Mood and Affect: Mood normal.     Dulce Sellar, NP

## 2023-01-29 NOTE — Assessment & Plan Note (Signed)
chronic taking Lisinopril, Metoprolol followed by Cardiology for refills

## 2023-01-30 ENCOUNTER — Ambulatory Visit: Payer: Medicare Other | Admitting: Pulmonary Disease

## 2023-01-30 ENCOUNTER — Encounter: Payer: Self-pay | Admitting: Family

## 2023-01-30 ENCOUNTER — Encounter: Payer: Self-pay | Admitting: Pulmonary Disease

## 2023-01-30 VITALS — BP 118/70 | HR 83 | Ht 64.0 in | Wt 183.4 lb

## 2023-01-30 DIAGNOSIS — J449 Chronic obstructive pulmonary disease, unspecified: Secondary | ICD-10-CM | POA: Diagnosis not present

## 2023-01-30 NOTE — Assessment & Plan Note (Signed)
New elevated glucose levels w/pulmonology labs A1C today 6.1 advised pt on low carb/low sugar diet, increased water intake, drinking lemon water to lose weight, advised to reduce to 1 glass qd increase exercise where able will continue to monitor f/u 6 mos

## 2023-01-30 NOTE — Progress Notes (Signed)
Synopsis: Referred in August 2023 for COPD by Rhetta Mura, PA  Subjective:   PATIENT ID: Gary Wright GENDER: male DOB: 1945-11-04, MRN: [id number]  HPI  Chief Complaint  Patient presents with   Follow-up    6wk f/u for COPD. States his breathing has been stable since last visit.    Gary Wright is a 77 year old male, former smoker with CAD, hypertension, hyperlipidemia and lung cancer s/p LUL lobectomy 10/30/2018 who returns to pulmonary clinic for COPD.   He is on trelegy ellipta 1 puff daily and as needed albuterol inhaler or nebulizer treatment. He is also on roflumilast every other day. He is coughing less and having less mucous production.  Initial OV 05/23/22 He was admitted 6/4 to 6/7 for COPD exacerbation. He is currently on Trelegy ellipta 1 puff daily. He is using albuterol inhaler 1- 2 times per day. He is using albuterol nebulizer treatments 3 times per day.   He has on going dyspnea. He does have some wheezing intermittently. He does have cough with some phlegm, that is light brown in color.   He has oxygen equipment at home and is not using it. He denies issues with seasonal allergies. He does have heart burn issues more so at night. He is sleeping with head of bed elevated.   He is retired. He lives with his wife. He quit smoking in 2019. He smoked 1.5 pack per day for 20 years.    Past Medical History:  Diagnosis Date   Acute exacerbation of chronic obstructive pulmonary disease (COPD) 03/25/2022   CAD (coronary artery disease) of artery bypass graft 06/13/2016   COPD (chronic obstructive pulmonary disease)    Depression 09/01/2007   Qualifier: Diagnosis of  By: Genelle Gather CMA, [place]     Elevated troponin 06/13/2016   History of lung cancer 11/02/2020   Hyperlipidemia 11/02/2020   Hypertension    Prolonged QT interval 11/02/2020   Stress-induced cardiomyopathy 06/13/2016     Family History  Problem Relation Age of Onset   Hypertension Mother     Hypertension Father      Social History   Socioeconomic History   Marital status: Legally Separated    Spouse name: Not on file   Number of children: Not on file   Years of education: Not on file   Highest education level: Not on file  Occupational History   Not on file  Tobacco Use   Smoking status: Former    Packs/day: .5    Types: Cigarettes    Quit date: 2019    Years since quitting: 5.2   Smokeless tobacco: Never  Vaping Use   Vaping Use: Never used  Substance and Sexual Activity   Alcohol use: No    Comment: No alcohol for 17 years   Drug use: No   Sexual activity: Not on file  Other Topics Concern   Not on file  Social History Narrative   Not on file   Social Determinants of Health   Financial Resource Strain: Not on file  Food Insecurity: Not on file  Transportation Needs: Not on file  Physical Activity: Not on file  Stress: Not on file  Social Connections: Not on file  Intimate Partner Violence: Not on file     No Known Allergies   Outpatient Medications Prior to Visit  Medication Sig Dispense Refill   albuterol (PROVENTIL) (2.5 MG/3ML) 0.083% nebulizer solution Take 2.5 mg by nebulization 3 (three) times daily as needed for wheezing  or shortness of breath.     albuterol (VENTOLIN HFA) 108 (90 Base) MCG/ACT inhaler Inhale 2 puffs into the lungs every 6 (six) hours as needed for wheezing or shortness of breath. 8 g 2   aspirin 81 MG chewable tablet CHEW AND SWALLOW 1 TABLET(81 MG) BY MOUTH DAILY (Patient taking differently: Chew 81 mg by mouth daily at 12 noon.) 30 tablet 2   atorvastatin (LIPITOR) 40 MG tablet TAKE 1 TABLET(40 MG) BY MOUTH DAILY AT 6 PM 90 tablet 3   buPROPion (WELLBUTRIN XL) 300 MG 24 hr tablet Take 300 mg by mouth every morning.  1   citalopram (CELEXA) 40 MG tablet Take 40 mg by mouth every morning.  0   ezetimibe (ZETIA) 10 MG tablet TAKE 1 TABLET(10 MG) BY MOUTH DAILY 90 tablet 3   lisinopril (ZESTRIL) 40 MG tablet TAKE 1  TABLET(40 MG) BY MOUTH DAILY 60 tablet 2   metoprolol succinate (TOPROL-XL) 25 MG 24 hr tablet TAKE 1/2 TABLET(12.5 MG) BY MOUTH DAILY 45 tablet 6   roflumilast (DALIRESP) 500 MCG TABS tablet Take 1 tablet (500 mcg total) by mouth daily. 30 tablet 11   Roflumilast (DALIRESP) 250 MCG TABS Take 1 tablet by mouth daily. 28 tablet 0   No facility-administered medications prior to visit.    Review of Systems  Constitutional:  Negative for chills, fever, malaise/fatigue and weight loss.  HENT:  Negative for congestion, sinus pain and sore throat.   Eyes: Negative.   Respiratory:  Positive for shortness of breath. Negative for cough, hemoptysis and sputum production.   Cardiovascular:  Negative for chest pain, palpitations, orthopnea, claudication and leg swelling.  Gastrointestinal:  Negative for abdominal pain, heartburn, nausea and vomiting.  Genitourinary: Negative.   Musculoskeletal:  Negative for joint pain and myalgias.  Skin:  Negative for rash.  Neurological:  Negative for weakness.  Endo/Heme/Allergies: Negative.    Objective:   Vitals:   01/30/23 1329  BP: 118/70  Pulse: 83  SpO2: 99%  Weight: 183 lb 6.4 oz (83.2 kg)  Height: 5\' 4"  (1.626 m)   Physical Exam Constitutional:      General: He is not in acute distress. HENT:     Head: Normocephalic and atraumatic.  Eyes:     Conjunctiva/sclera: Conjunctivae normal.  Cardiovascular:     Rate and Rhythm: Normal rate and regular rhythm.     Pulses: Normal pulses.     Heart sounds: Normal heart sounds. No murmur heard. Pulmonary:     Effort: Pulmonary effort is normal.     Breath sounds: No wheezing or rhonchi.  Abdominal:     General: Bowel sounds are normal.     Palpations: Abdomen is soft.  Musculoskeletal:     Right lower leg: No edema.     Left lower leg: No edema.  Skin:    General: Skin is warm and dry.  Neurological:     General: No focal deficit present.     Mental Status: He is alert.    CBC     Component Value Date/Time   WBC 18.8 (H) 03/27/2022 0331   RBC 4.37 03/27/2022 0331   HGB 13.6 03/27/2022 0331   HCT 41.7 03/27/2022 0331   PLT 273 03/27/2022 0331   MCV 95.4 03/27/2022 0331   MCH 31.1 03/27/2022 0331   MCHC 32.6 03/27/2022 0331   RDW 13.1 03/27/2022 0331   LYMPHSABS 0.7 03/27/2022 0331   MONOABS 1.2 (H) 03/27/2022 0331   EOSABS 0.0 03/27/2022 0331  BASOSABS 0.0 03/27/2022 0331      Latest Ref Rng & Units 12/30/2022    2:13 PM 12/27/2022    2:46 PM 12/20/2022    2:45 PM  BMP  Glucose 70 - 99 mg/dL 409  68  811   BUN 6 - 23 mg/dL Creatinine 0.40 - 1.50 mg/dL 9.14  7.82  9.56   Sodium 135 - 145 mEq/L 137  140  138   Potassium 3.5 - 5.1 mEq/L 4.5  4.9  4.9   Chloride 96 - 112 mEq/L 102  103  102   CO2 19 - 32 mEq/L Calcium 8.4 - 10.5 mg/dL 9.2  9.9  9.6    Chest imaging: CXR 09/11/22 Heart size and mediastinal contours are unremarkable. Postop change from left upper lobectomy. There is no pleural effusion or edema identified. No airspace opacities identified. The visualized osseous structures are notable for bilateral glenohumeral joint osteoarthritis and thoracic spondylosis.  PFT:    Latest Ref Rng & Units 09/11/2022    9:47 AM  PFT Results  FVC-Pre L 2.96   FVC-Predicted Pre % 70   FVC-Post L 3.22   FVC-Predicted Post % 76   Pre FEV1/FVC % % 54   Post FEV1/FCV % % 57   FEV1-Pre L 1.58   FEV1-Predicted Pre % 52   FEV1-Post L 1.83   DLCO uncorrected ml/min/mmHg 16.42   DLCO UNC% % 65   DLCO corrected ml/min/mmHg 16.42   DLCO COR %Predicted % 65   DLVA Predicted % 78   TLC L 6.78   TLC % Predicted % 96   RV % Predicted % 132     Labs:  Path:  Echo 03/24/22: LV EF 40-45%. RV systolic function and size is normal.   Heart Catheterization:  Assessment & Plan:   Chronic obstructive pulmonary disease, unspecified COPD type  Discussion: Gary Wright is a 77 year old male, former smoker with CAD, hypertension,  hyperlipidemia and lung cancer s/p LUL lobectomy 10/30/2018 who returns to pulmonary clinic for COPD.   He has centrilobular emphysema based on CT Chest scan from 2020.   He is to continue trelegy ellipta 1 puff daily and as needed albuterol inhaler/nebulizer treatments. He is to continue daliresp daily.  Follow up in 6 months with pulmonary function tests.  Melody Comas, MD [facility] Office: [phone]   Current Outpatient Medications:    albuterol (PROVENTIL) (2.5 MG/3ML) 0.083% nebulizer solution, Take 2.5 mg by nebulization 3 (three) times daily as needed for wheezing or shortness of breath., Disp: , Rfl:    albuterol (VENTOLIN HFA) 108 (90 Base) MCG/ACT inhaler, Inhale 2 puffs into the lungs every 6 (six) hours as needed for wheezing or shortness of breath., Disp: 8 g, Rfl: 2   aspirin 81 MG chewable tablet, CHEW AND SWALLOW 1 TABLET(81 MG) BY MOUTH DAILY (Patient taking differently: Chew 81 mg by mouth daily at 12 noon.), Disp: 30 tablet, Rfl: 2   atorvastatin (LIPITOR) 40 MG tablet, TAKE 1 TABLET(40 MG) BY MOUTH DAILY AT 6 PM, Disp: 90 tablet, Rfl: 3   buPROPion (WELLBUTRIN XL) 300 MG 24 hr tablet, Take 300 mg by mouth every morning., Disp: , Rfl: 1   citalopram (CELEXA) 40 MG tablet, Take 40 mg by mouth every morning., Disp: , Rfl: 0   ezetimibe (ZETIA) 10 MG tablet, TAKE 1 TABLET(10 MG) BY MOUTH DAILY, Disp: 90  tablet, Rfl: 3   lisinopril (ZESTRIL) 40 MG tablet, TAKE 1 TABLET(40 MG) BY MOUTH DAILY, Disp: 60 tablet, Rfl: 2   metoprolol succinate (TOPROL-XL) 25 MG 24 hr tablet, TAKE 1/2 TABLET(12.5 MG) BY MOUTH DAILY, Disp: 45 tablet, Rfl: 6   roflumilast (DALIRESP) 500 MCG TABS tablet, Take 1 tablet (500 mcg total) by mouth daily., Disp: 30 tablet, Rfl: 11

## 2023-01-30 NOTE — Patient Instructions (Addendum)
Continue trelegy ellipta 1 puff daily - rinse mouth out after each use  Continue to use albuterol inhaler or nebulizer treatments as needed  Continue daliresp daily (1 tab daily)  Follow up in 6 months

## 2023-02-01 ENCOUNTER — Other Ambulatory Visit: Payer: Self-pay | Admitting: Cardiology

## 2023-02-26 ENCOUNTER — Telehealth: Payer: Self-pay | Admitting: Family

## 2023-02-26 NOTE — Telephone Encounter (Signed)
Copied from CRM [id number]. Topic: Medicare AWV >> Feb 26, 2023 11:52 AM Gwenith Spitz wrote: Reason for CRM: Called patient to schedule Medicare Annual Wellness Visit (AWV). Left message for patient to call back and schedule Medicare Annual Wellness Visit (AWV).  Last date of AWV: N/A  Please schedule AWVI with Inetta Fermo, [facility].  If any questions, please contact me at [phone].  Thank you ,  Gabriel Cirri [facility] AWV TEAM Direct Dial [phone]

## 2023-03-03 ENCOUNTER — Other Ambulatory Visit: Payer: Self-pay | Admitting: Pulmonary Disease

## 2023-05-07 ENCOUNTER — Telehealth: Payer: Self-pay | Admitting: Pulmonary Disease

## 2023-05-07 NOTE — Telephone Encounter (Signed)
 ATC x1.  LVM to return call. 

## 2023-05-07 NOTE — Telephone Encounter (Signed)
Patient states currently taking Daliresp. States has a rash. Started when taking medication. Patient phone number is [phone].

## 2023-05-08 NOTE — Telephone Encounter (Signed)
Strongly doubt it's the daliresp but ok to stop x 2 week off and see pcp in meantime to eval what else might be  considered

## 2023-05-08 NOTE — Telephone Encounter (Signed)
Called and spoke with Marisue Humble [facility]) patients wife.  Dr. Thurston Hole recommendations given.  Understanding stated.  Nothing further at this time.

## 2023-05-08 NOTE — Telephone Encounter (Signed)
Spoke with the pt He c/o rash on left buttock and his ankles x 2 months  He says it's red, splotchy and itches No other symptoms  He read that this could be side effect of daliresp that he started 4 months ago  Dr Francine Graven, please advise, thanks!

## 2023-05-08 NOTE — Telephone Encounter (Signed)
Pt. Calling back to speak with nurse about the medication

## 2023-05-16 ENCOUNTER — Ambulatory Visit: Payer: Medicare Other | Admitting: Family

## 2023-05-16 VITALS — BP 103/67 | HR 85 | Temp 97.1°F | Ht 64.0 in | Wt 175.6 lb

## 2023-05-16 DIAGNOSIS — B369 Superficial mycosis, unspecified: Secondary | ICD-10-CM | POA: Diagnosis not present

## 2023-05-16 MED ORDER — FLUCONAZOLE 200 MG PO TABS: ORAL_TABLET | ORAL | 0 refills | Status: DC

## 2023-05-16 MED ORDER — CLOTRIMAZOLE-BETAMETHASONE 1-0.05 % EX CREA: 1.0000 | TOPICAL_CREAM | Freq: Two times a day (BID) | CUTANEOUS | 0 refills | Status: DC

## 2023-05-16 NOTE — Progress Notes (Signed)
Patient ID: Gary Wright, male    DOB: [birth year], 77 y.o.   MRN: [id number]  Chief Complaint  Patient presents with   Rash    Pt c/o rash on left buttock for 2 months, caused by Daliresp. Has tried psoriasis and eczema creams which did not help. Rash is red, painful and itchy.    HPI:      Skin rash:  located on left buttock, reports having for about 2 months. Thought related to a new med Pulmonary gave him, Daliresp, he called their office and they told him to stop the med for 2 weeks, which he did but rash remained. He has tried an OTC eczema cream on his lateral ankles for rash there as well, but hasn't helped his bottom.  Assessment & Plan:  1. Fungal skin infection- right buttock, extending from rectum out to middle of buttock, approx 10 inches in diameter, with crusted, raised border and mild erythema and a few red bumps inside demarcation. Sending diflucan and Lotrisone cream, advised on use & SE. Call back next week if no improvement.    - fluconazole (DIFLUCAN) 200 MG tablet; Take 1 pill daily with breakfast.  Dispense: 3 tablet; Refill: 0 - clotrimazole-betamethasone (LOTRISONE) cream; Apply 1 Application topically 2 (two) times daily for 7 days. Apply to right buttock.  Dispense: 30 g; Refill: 0   Subjective:    Outpatient Medications Prior to Visit  Medication Sig Dispense Refill   albuterol (PROVENTIL) (2.5 MG/3ML) 0.083% nebulizer solution USE 1 VIAL VIA NEBULIZER FOUR TIMES DAILY AS NEEDED 360 mL 2   albuterol (VENTOLIN HFA) 108 (90 Base) MCG/ACT inhaler INHALE 2 PUFFS BY MOUTH EVERY 4 TO 6 HOURS AS NEEDED 20.1 g 1   aspirin 81 MG chewable tablet CHEW AND SWALLOW 1 TABLET(81 MG) BY MOUTH DAILY (Patient taking differently: Chew 81 mg by mouth daily at 12 noon.) 30 tablet 2   atorvastatin (LIPITOR) 40 MG tablet TAKE 1 TABLET(40 MG) BY MOUTH DAILY AT 6 PM 90 tablet 3   buPROPion (WELLBUTRIN XL) 300 MG 24 hr tablet Take 300 mg by mouth every morning.  1   citalopram  (CELEXA) 40 MG tablet Take 40 mg by mouth every morning.  0   ezetimibe (ZETIA) 10 MG tablet TAKE 1 TABLET(10 MG) BY MOUTH DAILY 90 tablet 3   lisinopril (ZESTRIL) 40 MG tablet TAKE 1 TABLET(40 MG) BY MOUTH DAILY 90 tablet 1   metoprolol succinate (TOPROL-XL) 25 MG 24 hr tablet TAKE 1/2 TABLET(12.5 MG) BY MOUTH DAILY 45 tablet 6   roflumilast (DALIRESP) 500 MCG TABS tablet Take 1 tablet (500 mcg total) by mouth daily. 30 tablet 11   No facility-administered medications prior to visit.   Past Medical History:  Diagnosis Date   Acute dyspnea 03/24/2022   Acute exacerbation of chronic obstructive pulmonary disease (COPD) (HCC) 03/25/2022   CAD (coronary artery disease) of artery bypass graft 06/13/2016   Chest pain    COPD (chronic obstructive pulmonary disease) (HCC)    Depression 09/01/2007   Qualifier: Diagnosis of  By: Genelle Gather CMA, [place]     Elevated troponin 06/13/2016   History of lung cancer 11/02/2020   Hyperlipidemia 11/02/2020   Hypertension    Prolonged QT interval 11/02/2020   Stress-induced cardiomyopathy 06/13/2016   Past Surgical History:  Procedure Laterality Date   BACK SURGERY     fusion in 1973   CARDIAC CATHETERIZATION N/A 06/13/2016   Procedure: Left Heart Cath and Coronary Angiography;  Surgeon: Cristal Deer  End, MD;  Location: [facility] INVASIVE CV LAB;  Service: Cardiovascular;  Laterality: N/A;   INGUINAL HERNIA REPAIR Right 04/21/2018   Procedure: RIGHT INGUINAL HERNIA REPAIR ERAS PATHWAY;  Surgeon: Harriette Bouillon, MD;  Location: [facility] OR;  Service: General;  Laterality: Right;  TAP BLOCK   INSERTION OF MESH Right 04/21/2018   Procedure: INSERTION OF MESH;  Surgeon: Harriette Bouillon, MD;  Location: [facility] OR;  Service: General;  Laterality: Right;   TONSILLECTOMY     No Known Allergies    Objective:    Physical Exam Vitals and nursing note reviewed.  Constitutional:      General: He is not in acute distress.    Appearance: Normal appearance.  HENT:     Head:  Normocephalic.  Cardiovascular:     Rate and Rhythm: Normal rate and regular rhythm.  Pulmonary:     Effort: Pulmonary effort is normal.     Breath sounds: Normal breath sounds.  Musculoskeletal:        General: Normal range of motion.     Cervical back: Normal range of motion.       Legs:  Skin:    General: Skin is warm and dry.  Neurological:     Mental Status: He is alert and oriented to person, place, and time.  Psychiatric:        Mood and Affect: Mood normal.    BP 103/67   Pulse 85   Temp (!) 97.1 F (36.2 C) (Temporal)   Ht 5\' 4"  (1.626 m)   Wt 175 lb 9.6 oz (79.7 kg)   SpO2 98%   BMI 30.14 kg/m  Wt Readings from Last 3 Encounters:  05/16/23 175 lb 9.6 oz (79.7 kg)  01/30/23 183 lb 6.4 oz (83.2 kg)  01/29/23 184 lb (83.5 kg)       Dulce Sellar, NP

## 2023-05-16 NOTE — Patient Instructions (Signed)
It was very nice to see you today!   I have sent in pills and antifungal cream to your pharmacy. Start these today.  Let me know next week if rash is not better.      PLEASE NOTE:  If you had any lab tests please let us know if you have not heard back within a few days. You may see your results on MyChart before we have a chance to review them but we will give you a call once they are reviewed by [place]. If we ordered any referrals today, please let us know if you have not heard from their office within the next week.

## 2023-05-23 ENCOUNTER — Ambulatory Visit: Payer: Medicare Other | Admitting: Family

## 2023-05-23 ENCOUNTER — Encounter: Payer: Self-pay | Admitting: *Deleted

## 2023-06-01 NOTE — Progress Notes (Unsigned)
Gary Wright, male    DOB: 08-Aug-1946   MRN: [id number]   Brief patient profile:  43  yowm quit smoking 2019  [facility] pt acute ov/ pulmonary clinic 06/02/2023   for aecopd end of July 2024        History of Present Illness  06/02/2023  Pulmonary/ 1st office eval/ on ACEi Chief Complaint  Patient presents with   Acute Visit    Pt c/o cough with yellow sputum, wheezing, chest tightness over the past wk.   Dyspnea:  now 50 ft vs walks around the block at home at baseline  Cough: yellow mucus esp daytime  Sleep: flat bed with two pillows assoc with HB  SABA use: neb tid baseline and same nowand now using hfa 2-3 x daily   No obvious day to day or daytime pattern/variability or assoc   mucus plugs or hemoptysis or cp or    overt sinus  symptoms.    . Also denies any obvious fluctuation of symptoms with weather or environmental changes or other aggravating or alleviating factors except as outlined above   No unusual exposure hx or h/o childhood pna/ asthma or knowledge of premature birth.  Current Allergies, Complete Past Medical History, Past Surgical History, Family History, and Social History were reviewed in [employer] record.  ROS  The following are not active complaints unless bolded Hoarseness, sore throat, dysphagia, dental problems, itching, sneezing,  nasal congestion or discharge of excess mucus or purulent secretions, ear ache,   fever, chills, sweats, unintended wt loss or wt gain, classically pleuritic or exertional cp,  orthopnea pnd or arm/hand swelling  or leg swelling, presyncope, palpitations, abdominal pain, anorexia, nausea, vomiting, diarrhea  or change in bowel habits or change in bladder habits, change in stools or change in urine, dysuria, hematuria,  rash, arthralgias, visual complaints, headache, numbness, weakness or ataxia or problems with walking or coordination,  change in mood or  memory.             Outpatient Medications Prior  to Visit  Medication Sig Dispense Refill   albuterol (PROVENTIL) (2.5 MG/3ML) 0.083% nebulizer solution USE 1 VIAL VIA NEBULIZER FOUR TIMES DAILY AS NEEDED 360 mL 2   albuterol (VENTOLIN HFA) 108 (90 Base) MCG/ACT inhaler INHALE 2 PUFFS BY MOUTH EVERY 4 TO 6 HOURS AS NEEDED 20.1 g 1   aspirin 81 MG chewable tablet CHEW AND SWALLOW 1 TABLET(81 MG) BY MOUTH DAILY (Patient taking differently: Chew 81 mg by mouth daily at 12 noon.) 30 tablet 2   atorvastatin (LIPITOR) 40 MG tablet TAKE 1 TABLET(40 MG) BY MOUTH DAILY AT 6 PM 90 tablet 3   buPROPion (WELLBUTRIN XL) 300 MG 24 hr tablet Take 300 mg by mouth every morning.  1   citalopram (CELEXA) 40 MG tablet Take 40 mg by mouth every morning.  0   lisinopril (ZESTRIL) 40 MG tablet TAKE 1 TABLET(40 MG) BY MOUTH DAILY 90 tablet 1   metoprolol succinate (TOPROL-XL) 25 MG 24 hr tablet TAKE 1/2 TABLET(12.5 MG) BY MOUTH DAILY 45 tablet 6   roflumilast (DALIRESP) 500 MCG TABS tablet Take 1 tablet (500 mcg total) by mouth daily. 30 tablet 11   ezetimibe (ZETIA) 10 MG tablet TAKE 1 TABLET(10 MG) BY MOUTH DAILY 90 tablet 3   fluconazole (DIFLUCAN) 200 MG tablet Take 1 pill daily with breakfast. 3 tablet 0   No facility-administered medications prior to visit.    Past Medical History:  Diagnosis Date  Acute dyspnea 03/24/2022   Acute exacerbation of chronic obstructive pulmonary disease (COPD) (HCC) 03/25/2022   CAD (coronary artery disease) of artery bypass graft 06/13/2016   Chest pain    COPD (chronic obstructive pulmonary disease) (HCC)    Depression 09/01/2007   Qualifier: Diagnosis of  By: Genelle Gather CMA, [place]     Elevated troponin 06/13/2016   History of lung cancer 11/02/2020   Hyperlipidemia 11/02/2020   Hypertension    Prolonged QT interval 11/02/2020   Stress-induced cardiomyopathy 06/13/2016      Objective:     BP 106/60 (BP Location: Left Arm, Cuff Size: Normal)   Pulse 75   Temp 97.7 F (36.5 C) (Oral)   Ht 5\' 4"  (1.626 m)    Wt 177 lb (80.3 kg)   SpO2 98% Comment: on RA  BMI 30.38 kg/m   SpO2: 98 % (on RA)  Hoarse elderly wm walks with cane / classic mod severe pseudowheeze  HEENT : Oropharynx  clear     NECK :  without  apparent JVD/ palpable Nodes/TM    LUNGS: no acc muscle use,  Mild barrel  contour chest wall with bilateral  Distant exp wheeze and  without cough on insp or exp maneuvers  and mild  Hyperresonant  to  percussion bilaterally     CV:  RRR  no s3 or murmur or increase in P2, and no edema   ABD:  soft and nontender with pos end  insp Hoover's  in the supine position.  No bruits or organomegaly appreciated   MS:  Nl gait/ ext warm without deformities Or obvious joint restrictions  calf tenderness, cyanosis or clubbing     SKIN: warm and dry without lesions    NEURO:  alert, approp, nl sensorium with  no motor or cerebellar deficits apparent.        Assessment   COPD with chronic bronchitis and emphysema (HCC) DDX of  difficult airways management almost all start with A and  include Adherence, Ace Inhibitors, Acid Reflux, Active Sinus Disease, Alpha 1 Antitripsin deficiency, Anxiety masquerading as Airways dz,  ABPA,  Allergy(esp in young), Aspiration (esp in elderly), Adverse effects of meds,  Active smoking or vaping, A bunch of PE's (a small clot burden can't cause this syndrome unless there is already severe underlying pulm or vascular dz with poor reserve) plus two Bs  = Bronchiectasis and Beta blocker use..and one C= CHF   ACEi adverse effects at the  top of the usual list of suspects and the only way to rule it out is a trial off > see hbp  ? Acid (or non-acid) GERD > always difficult to exclude as up to 75% of pts in some series report no assoc GI/ Heartburn symptoms and 100% in those who have active HB, which is the case here > rec max (24h)  acid suppression just using otc for now until acei out of the system then try off   ? Allergy > Prednisone 10 mg take  4 each am x 2  days,   2 each am x 2 days,  1 each am x 2 days and stop   ? Active sinus dz/ rhinitis > doubt serious infection so rx just zpak  ? Adverse reaction to dpi > ok for trelegy 100 for now with f/u by Dr Francine Graven in 4 weeks         Essential hypertension Try off acei 06/02/2023   In the best review of chronic cough  to date ( NEJM 2016 375 1544-1551) ,  ACEi are now felt to cause cough in up to  20% of pts which is a 4 fold increase from previous reports and does not include the variety of non-specific complaints we see in pulmonary clinic in pts on ACEi but previously attributed to another dx like  Copd/asthma and  include PNDS, throat  congestion, "bronchitis", unexplained dyspnea and noct "strangling" sensations, and hoarseness, but also  atypical /refractory GERD symptoms like dysphagia and "bad heartburn"   The only way I know  to prove this is not an "ACEi Case" is a trial off ACEi x a minimum of 4 weeks then regroup.   >>> try olmesartan 40 mg daily in place of lisinopril 40  mg and call if problems (may need to hold the BB if bp too low or cut the olmesartan in half   Discussed in detail all the  indications, usual  risks and alternatives  relative to the benefits with patient who agrees to proceed with Rx as outlined.             Each maintenance medication was reviewed in detail including emphasizing most importantly the difference between maintenance and prns and under what circumstances the prns are to be triggered using an action plan format where appropriate.  Total time for H and P, chart review, counseling, reviewing dpi/hfa/neb  device(s) and generating customized AVS unique to this ACUTE office visit / same day charting = 40 min with pt new to me           Sandrea Hughs, MD 06/02/2023

## 2023-06-02 ENCOUNTER — Ambulatory Visit (INDEPENDENT_AMBULATORY_CARE_PROVIDER_SITE_OTHER): Payer: Medicare Other | Admitting: Internal Medicine

## 2023-06-02 ENCOUNTER — Encounter: Payer: Self-pay | Admitting: Internal Medicine

## 2023-06-02 VITALS — BP 106/60 | HR 75 | Temp 97.7°F | Ht 64.0 in | Wt 177.0 lb

## 2023-06-02 DIAGNOSIS — I10 Essential (primary) hypertension: Secondary | ICD-10-CM

## 2023-06-02 DIAGNOSIS — J4489 Other specified chronic obstructive pulmonary disease: Secondary | ICD-10-CM | POA: Diagnosis not present

## 2023-06-02 DIAGNOSIS — J439 Emphysema, unspecified: Secondary | ICD-10-CM | POA: Diagnosis not present

## 2023-06-02 MED ORDER — PREDNISONE 10 MG PO TABS: ORAL_TABLET | ORAL | 0 refills | Status: DC

## 2023-06-02 MED ORDER — AZITHROMYCIN 250 MG PO TABS: ORAL_TABLET | ORAL | 0 refills | Status: DC

## 2023-06-02 MED ORDER — OLMESARTAN MEDOXOMIL 40 MG PO TABS: 40.0000 mg | ORAL_TABLET | Freq: Every day | ORAL | 11 refills | Status: DC

## 2023-06-02 NOTE — Assessment & Plan Note (Addendum)
DDX of  difficult airways management almost all start with A and  include Adherence, Ace Inhibitors, Acid Reflux, Active Sinus Disease, Alpha 1 Antitripsin deficiency, Anxiety masquerading as Airways dz,  ABPA,  Allergy(esp in young), Aspiration (esp in elderly), Adverse effects of meds,  Active smoking or vaping, A bunch of PE's (a small clot burden can't cause this syndrome unless there is already severe underlying pulm or vascular dz with poor reserve) plus two Bs  = Bronchiectasis and Beta blocker use..and one C= CHF   ACEi adverse effects at the  top of the usual list of suspects and the only way to rule it out is a trial off > see hbp  ? Acid (or non-acid) GERD > always difficult to exclude as up to 75% of pts in some series report no assoc GI/ Heartburn symptoms and 100% in those who have active HB, which is the case here > rec max (24h)  acid suppression just using otc for now until acei out of the system then try off   ? Allergy > Prednisone 10 mg take  4 each am x 2 days,   2 each am x 2 days,  1 each am x 2 days and stop   ? Active sinus dz/ rhinitis > doubt serious infection so rx just zpak  ? Adverse reaction to dpi > ok for trelegy 100 for now with f/u by Dr Francine Graven in 4 weeks

## 2023-06-02 NOTE — Assessment & Plan Note (Signed)
Try off acei 06/02/2023   In the best review of chronic cough to date ( NEJM 2016 375 [id number]) ,  ACEi are now felt to cause cough in up to  20% of pts which is a 4 fold increase from previous reports and does not include the variety of non-specific complaints we see in pulmonary clinic in pts on ACEi but previously attributed to another dx like  Copd/asthma and  include PNDS, throat  congestion, "bronchitis", unexplained dyspnea and noct "strangling" sensations, and hoarseness, but also  atypical /refractory GERD symptoms like dysphagia and "bad heartburn"   The only way I know  to prove this is not an "ACEi Case" is a trial off ACEi x a minimum of 4 weeks then regroup.   >>> try olmesartan 40 mg daily in place of lisinopril 40  mg and call if problems (may need to hold the BB if bp too low or cut the olmesartan in half   Discussed in detail all the  indications, usual  risks and alternatives  relative to the benefits with patient who agrees to proceed with Rx as outlined.             Each maintenance medication was reviewed in detail including emphasizing most importantly the difference between maintenance and prns and under what circumstances the prns are to be triggered using an action plan format where appropriate.  Total time for H and P, chart review, counseling, reviewing dpi/hfa/neb  device(s) and generating customized AVS unique to this ACUTE office visit / same day charting = 40 min with pt new to me

## 2023-06-02 NOTE — Patient Instructions (Addendum)
Stop lisinopril and replace with olmesartan 40 mg daily  - call if too strong   Zpak   Prednisone 10 mg take  4 each am x 2 days,   2 each am x 2 days,  1 each am x 2 days and stop   Try prilosec otc 20mg   Take 30-60 min before first meal of the day and Pepcid ac (famotidine) 20 mg one an hour before bedtime  until cough is completely gone for at least a week without the need for cough suppression      Please schedule a follow up office visit in 4 weeks, sooner if needed to see [facility]

## 2023-06-03 ENCOUNTER — Other Ambulatory Visit: Payer: Self-pay

## 2023-06-03 ENCOUNTER — Other Ambulatory Visit: Payer: Self-pay | Admitting: Pulmonary Disease

## 2023-06-03 DIAGNOSIS — Z87891 Personal history of nicotine dependence: Secondary | ICD-10-CM

## 2023-06-03 DIAGNOSIS — J449 Chronic obstructive pulmonary disease, unspecified: Secondary | ICD-10-CM

## 2023-06-03 DIAGNOSIS — Z122 Encounter for screening for malignant neoplasm of respiratory organs: Secondary | ICD-10-CM

## 2023-06-06 ENCOUNTER — Telehealth: Payer: Self-pay | Admitting: Internal Medicine

## 2023-06-06 NOTE — Telephone Encounter (Signed)
Patient states that he is out of his medication trilogy and needs a refill.   Pharmacy:[facility] on [address] and [address]

## 2023-06-09 NOTE — Telephone Encounter (Signed)
Patient states that he went to the pharmacy and they did not have a trilogy prescription for him. They are unsure why this has happened. Please refill prescription.

## 2023-06-10 NOTE — Telephone Encounter (Signed)
Patient is calling back to see if his prescription has been filled.

## 2023-06-11 ENCOUNTER — Ambulatory Visit: Payer: Medicare Other | Admitting: Nurse Practitioner

## 2023-06-11 ENCOUNTER — Ambulatory Visit (INDEPENDENT_AMBULATORY_CARE_PROVIDER_SITE_OTHER): Payer: Medicare Other

## 2023-06-11 ENCOUNTER — Encounter: Payer: Self-pay | Admitting: Nurse Practitioner

## 2023-06-11 VITALS — BP 112/68 | HR 92 | Temp 98.0°F | Ht 68.0 in | Wt 179.2 lb

## 2023-06-11 DIAGNOSIS — J441 Chronic obstructive pulmonary disease with (acute) exacerbation: Secondary | ICD-10-CM | POA: Diagnosis not present

## 2023-06-11 MED ORDER — PREDNISONE 10 MG PO TABS: ORAL_TABLET | ORAL | 0 refills | Status: DC

## 2023-06-11 MED ORDER — TRELEGY ELLIPTA 100-62.5-25 MCG/ACT IN AEPB
1.0000 | INHALATION_SPRAY | Freq: Every day | RESPIRATORY_TRACT | 5 refills | Status: DC
Start: 2023-06-11 — End: 2023-06-12

## 2023-06-11 MED ORDER — AMOXICILLIN-POT CLAVULANATE 875-125 MG PO TABS
1.0000 | ORAL_TABLET | Freq: Two times a day (BID) | ORAL | 0 refills | Status: AC
Start: 2023-06-11 — End: 2023-06-18

## 2023-06-11 NOTE — Progress Notes (Signed)
@Patient  ID: Gary Wright, male    DOB: 1946/09/09, 77 y.o.   MRN: [id number]  Chief Complaint  Patient presents with   Acute Visit    Pt complains of SOB, Coughing with yellow sputum, chest tightness. Pt states he has not gotten any better since last visit.     Referring provider: Dulce Sellar, NP  HPI: 77 year old male, former smoker followed for COPD.  He is a patient of Dr. Lanora Manis and last seen in office 06/02/2023 by Dr. Sherene Sires for acute visit.  Past medical history significant for CAD, hypertension, HLD.  He does have a history of lung cancer status post left upper lobectomy 10/30/2018.  TEST/EVENTS:  05/2022 ONO: 1 minute 37 seconds less than 88% 09/11/2022 PFT: FVC 70, FEV1 52, ratio 57, TLC 96, DLCOunc 65.  Moderately severe obstructive disease with reversibility (15% improvement) 09/11/2022 CXR: Postop change from left upper lobectomy.  No acute process.  05/23/2022: OV with Dr. Francine Graven.  Admitted 6/4 to 6/7 for COPD exacerbation.  He has had ongoing dyspnea.  Does have some wheezing intermittently.  Has a cough with some phlegm, light brown in color.  Does have oxygen equipment at home but is not using it.  Symptoms are his baseline.  He will continue on Trelegy and as needed [place].  Order Lindell Spar.  Referred to lung cancer screening team.  Scheduled pulmonary function testing in 3 months.  09/11/2022: Ov with Ulysees Robarts NP for follow-up after undergoing pulmonary function testing which revealed moderately severe obstructive defect with reversibility and moderate diffusion defect.  He has been doing okay since he was here last.  He does feel like the Trelegy helped him; having some trouble with insurance coverage.  He plans to switch the beginning of the year and was told that it should only cost $58 a month.  Unfortunately, he has been having some acute symptoms over the last 2 weeks.  Has had an increased productive cough with brown sputum, more wheezing and feels little more short of  breath.  Also having some chest congestion.  Denies any hemoptysis, fevers, chills, night sweats, lower extremity swelling.  No interim sick exposures.  No URI symptoms.  Feeling a little more fatigued and weak. Eating and drinking well. Not using any over-the-counter cough syrups. Treated for AECOPD with prednisone and empiric doxycycline.  09/25/2022: OV with Ameliya Nicotra NP for follow up after being treated for AECOPD. Symptoms have improved today. His cough has mostly resolved. Feels like his breathing is back to his baseline. He's still having some fatigue and weakness, but this is slowly improving. Just not quite back to normal. He denies fevers, chills, hemoptysis, lower extremity swelling, wheezing. He is using Trelegy; feels like this works well for him. Still taking mucinex.   12/06/2022: OV with Velita Quirk NP for intended follow-up.  Unfortunately having some acute symptoms as well.  Over the last week, he has developed a increased productive cough with brown to yellow phlegm.  He also has felt more short winded and has noticed himself wheezing more.  Feels like his chest is a little bit tighter with deep breaths.  He denies any fevers, chills, hemoptysis, lower extremity swelling, orthopnea.  He is eating and drinking well.  He is using his Trelegy inhaler daily and neb treatments 3 times a day.  He is not currently taking Mucinex. Treated with prednisone taper and doxycyline course.  12/20/2022: OV with Precious Segall NP for follow up after being treated for AECOPD. He has had multiple exacerbations  over the past 6-8 months. He was hospitalized in June for AECOPD, treated outpatient in November and again in February. Feeling better today. Cough has cleared up and minimally productive; occasionally still having some white to tan phlegm. Breathing feels back to his baseline. No more chest congestion or wheezing. Using his Trelegy daily. Decision was made at his last OV to start him on daliresp once he improves.   01/30/2023:  Ov with Dr. Francine Graven. Coughing less and having less mucus production. Continue Trelegy and daliresp. F/u 6 months  06/02/2023: OV with Dr. Sherene Sires for acute symptoms with productive cough. Treated with prednisone and z pack. He was also changed from lisinopril to olmesartan to see if he had any improvement in cough.  06/11/2023: Today - acute Patient presents today for acute visit.  Still having trouble with a productive cough with yellow phlegm.  He felt some better on the Z-Pak and steroids but then he came off of them and his symptoms quickly returned.  He actually feels worse than he did before.  He is feeling more short winded than his baseline.  Having a lot of chest congestion and wheezing.  He also just feels overall fatigued and rundown.  He has not has any low oxygen levels at home.  He denies any fevers, chills, hemoptysis.  Eating and drinking well.  Using his Trelegy daily.  He is also using his neb treatments twice a day.  Not currently taking Mucinex.  He is still on the Daliresp which she has been tolerating well.  Prior to this, he had not had a flare since February 2024 and starting Daliresp.  No Known Allergies  Immunization History  Administered Date(s) Administered   Influenza, High Dose Seasonal PF 07/21/2018   Influenza-Unspecified 07/10/2022   PFIZER(Purple Top)SARS-COV-2 Vaccination 12/31/2019, 01/24/2020   Pneumococcal Polysaccharide-23 06/14/2016   Pneumococcal-Unspecified 09/10/2022   Respiratory Syncytial Virus Vaccine,Recomb Aduvanted(Arexvy) 09/10/2022    Past Medical History:  Diagnosis Date   Acute dyspnea 03/24/2022   Acute exacerbation of chronic obstructive pulmonary disease (COPD) (HCC) 03/25/2022   CAD (coronary artery disease) of artery bypass graft 06/13/2016   Chest pain    COPD (chronic obstructive pulmonary disease) (HCC)    Depression 09/01/2007   Qualifier: Diagnosis of  By: Genelle Gather CMA, [place]     Elevated troponin 06/13/2016   History of lung  cancer 11/02/2020   Hyperlipidemia 11/02/2020   Hypertension    Prolonged QT interval 11/02/2020   Stress-induced cardiomyopathy 06/13/2016    Tobacco History: Social History   Tobacco Use  Smoking Status Former   Current packs/day: 0.00   Types: Cigarettes   Quit date: 2019   Years since quitting: 5.6  Smokeless Tobacco Never   Counseling given: Not Answered   Outpatient Medications Prior to Visit  Medication Sig Dispense Refill   albuterol (PROVENTIL) (2.5 MG/3ML) 0.083% nebulizer solution USE 1 VIAL VIA NEBULIZER FOUR TIMES DAILY AS NEEDED 360 mL 2   albuterol (VENTOLIN HFA) 108 (90 Base) MCG/ACT inhaler INHALE 2 PUFFS BY MOUTH EVERY 4 TO 6 HOURS AS NEEDED 20.1 g 1   aspirin 81 MG chewable tablet CHEW AND SWALLOW 1 TABLET(81 MG) BY MOUTH DAILY (Patient taking differently: Chew 81 mg by mouth daily at 12 noon.) 30 tablet 2   atorvastatin (LIPITOR) 40 MG tablet TAKE 1 TABLET(40 MG) BY MOUTH DAILY AT 6 PM 90 tablet 3   buPROPion (WELLBUTRIN XL) 300 MG 24 hr tablet Take 300 mg by mouth every morning.  1  citalopram (CELEXA) 40 MG tablet Take 40 mg by mouth every morning.  0   metoprolol succinate (TOPROL-XL) 25 MG 24 hr tablet TAKE 1/2 TABLET(12.5 MG) BY MOUTH DAILY 45 tablet 6   olmesartan (BENICAR) 40 MG tablet Take 1 tablet (40 mg total) by mouth daily. 30 tablet 11   roflumilast (DALIRESP) 500 MCG TABS tablet Take 1 tablet (500 mcg total) by mouth daily. 30 tablet 11   azithromycin (ZITHROMAX) 250 MG tablet Take 2 on day one then 1 daily x 4 days 6 tablet 0   predniSONE (DELTASONE) 10 MG tablet Take  4 each am x 2 days,   2 each am x 2 days,  1 each am x 2 days and stop 14 tablet 0   No facility-administered medications prior to visit.     Review of Systems:   Constitutional: No weight loss or gain, night sweats, fevers, chills. +fatigue, lassitude  HEENT: No headaches, difficulty swallowing, tooth/dental problems, or sore throat. No sneezing, itching, ear ache, nasal  congestion, or post nasal drip CV:  No chest pain, orthopnea, PND, swelling in lower extremities, anasarca, dizziness, palpitations, syncope Resp: +shortness of breath with exertion; productive cough; wheezing.  No hemoptysis. No chest wall deformity GI:  No heartburn, indigestion, abdominal pain, nausea, vomiting, diarrhea, change in bowel habits, loss of appetite, bloody stools.  MSK:  No joint pain or swelling.   Neuro: No dizziness or lightheadedness.  Psych: No depression or anxiety. Mood stable.     Physical Exam:  BP 112/68 (BP Location: Right Arm, Cuff Size: Normal)   Pulse 92   Temp 98 F (36.7 C)   Ht 5\' 8"  (1.727 m)   Wt 179 lb 3.2 oz (81.3 kg)   SpO2 98%   BMI 27.25 kg/m   GEN: Pleasant, interactive, well-kempt; in no acute distress. HEENT:  Normocephalic and atraumatic. PERRLA. Sclera white. Nasal turbinates pink, moist and patent bilaterally. No rhinorrhea present. Oropharynx pink and moist, without exudate or edema. No lesions, ulcerations, or postnasal drip.  NECK:  Supple w/ fair ROM. No JVD present. Normal carotid impulses w/o bruits. Thyroid symmetrical with no goiter or nodules palpated. No lymphadenopathy.   CV: RRR, no m/r/g, no peripheral edema. Pulses intact, +2 bilaterally. No cyanosis, pallor or clubbing. PULMONARY:  Unlabored, regular breathing. Scattered wheezing bilaterally A&P. Congested cough. No accessory muscle use.  GI: BS present and normoactive. Soft, non-tender to palpation. No organomegaly or masses detected. MSK: No erythema, warmth or tenderness. Cap refil <2 sec all extrem. No deformities or joint swelling noted.  Neuro: A/Ox3. No focal deficits noted.   Skin: Warm, no lesions or rashe Psych: Normal affect and behavior. Judgement and thought content appropriate.     Lab Results:  CBC    Component Value Date/Time   WBC 18.8 (H) 03/27/2022 0331   RBC 4.37 03/27/2022 0331   HGB 13.6 03/27/2022 0331   HCT 41.7 03/27/2022 0331   PLT 273  03/27/2022 0331   MCV 95.4 03/27/2022 0331   MCH 31.1 03/27/2022 0331   MCHC 32.6 03/27/2022 0331   RDW 13.1 03/27/2022 0331   LYMPHSABS 0.7 03/27/2022 0331   MONOABS 1.2 (H) 03/27/2022 0331   EOSABS 0.0 03/27/2022 0331   BASOSABS 0.0 03/27/2022 0331    BMET    Component Value Date/Time   NA 137 12/30/2022 1413   NA 139 01/18/2021 1423   K 4.5 12/30/2022 1413   CL 102 12/30/2022 1413   CO2 29 12/30/2022 1413  GLUCOSE 127 (H) 12/30/2022 1413   BUN 22 12/30/2022 1413   BUN 14 01/18/2021 1423   CREATININE 1.45 12/30/2022 1413   CREATININE 0.87 07/01/2016 1032   CALCIUM 9.2 12/30/2022 1413   GFRNONAA >60 03/27/2022 0331   GFRAA 57 (L) 12/14/2019 1025    BNP    Component Value Date/Time   BNP 80.6 03/24/2022 1120     Imaging:  No results found.  Administration History     None          Latest Ref Rng & Units 09/11/2022    9:47 AM  PFT Results  FVC-Pre L 2.96   FVC-Predicted Pre % 70   FVC-Post L 3.22   FVC-Predicted Post % 76   Pre FEV1/FVC % % 54   Post FEV1/FCV % % 57   FEV1-Pre L 1.58   FEV1-Predicted Pre % 52   FEV1-Post L 1.83   DLCO uncorrected ml/min/mmHg 16.42   DLCO UNC% % 65   DLCO corrected ml/min/mmHg 16.42   DLCO COR %Predicted % 65   DLVA Predicted % 78   TLC L 6.78   TLC % Predicted % 96   RV % Predicted % 132     No results found for: "NITRICOXIDE"      Assessment & Plan:   COPD with acute exacerbation (HCC) Unresolved AECOPD.  Will obtain CXR to rule out superimposed infection.  Will go ahead and treat him with empiric course of Augmentin x 7 days and extended prednisone taper.  Maximize bronchodilator mucociliary clearance therapies.  Strict ED precautions.  Vital signs were stable today and he was nontoxic-appearing.  Close follow-up.  Action plan in place.  Patient Instructions  Continue Albuterol inhaler 2 puffs or 3 mL neb every 6 hours as needed for shortness of breath or wheezing. Notify if symptoms persist despite  rescue inhaler/neb use. Use your neb treatments at least twice daily until symptoms improve. Follow with flutter valve  Continue Trelegy 1 puff daily. Brush tongue and rinse mouth afterwards.  Continue daliresp 500 mcg daily   Augmentin 1 tab Twice daily for 7 days. Take with food Prednisone taper. 4 tabs for 3 days, then 3 tabs for 3 days, 2 tabs for 3 days, then 1 tab for 3 days, then stop. Take in AM with food  Restart Guaifenesin [phone] mg Twice daily for chest congestion  Follow up in 3-4 weeks with Dr. Francine Graven or Philis Nettle. If symptoms do not improve or worsen, please contact office for sooner follow up or seek emergency care.   I spent 35 minutes of dedicated to the care of this patient on the date of this encounter to include pre-visit review of records, face-to-face time with the patient discussing conditions above, post visit ordering of testing, clinical documentation with the electronic health record, making appropriate referrals as documented, and communicating necessary findings to members of the patients care team.  Noemi Chapel, NP 06/11/2023  Pt aware and understands NP's role.

## 2023-06-11 NOTE — Patient Instructions (Addendum)
Continue Albuterol inhaler 2 puffs or 3 mL neb every 6 hours as needed for shortness of breath or wheezing. Notify if symptoms persist despite rescue inhaler/neb use. Use your neb treatments at least twice daily until symptoms improve. Follow with flutter valve  Continue Trelegy 1 puff daily. Brush tongue and rinse mouth afterwards.  Continue daliresp 500 mcg daily   Augmentin 1 tab Twice daily for 7 days. Take with food Prednisone taper. 4 tabs for 3 days, then 3 tabs for 3 days, 2 tabs for 3 days, then 1 tab for 3 days, then stop. Take in AM with food  Restart Guaifenesin [phone] mg Twice daily for chest congestion  Follow up in 3-4 weeks with Dr. Francine Graven or Philis Nettle. If symptoms do not improve or worsen, please contact office for sooner follow up or seek emergency care.

## 2023-06-11 NOTE — Assessment & Plan Note (Signed)
Unresolved AECOPD.  Will obtain CXR to rule out superimposed infection.  Will go ahead and treat him with empiric course of Augmentin x 7 days and extended prednisone taper.  Maximize bronchodilator mucociliary clearance therapies.  Strict ED precautions.  Vital signs were stable today and he was nontoxic-appearing.  Close follow-up.  Action plan in place.  Patient Instructions  Continue Albuterol inhaler 2 puffs or 3 mL neb every 6 hours as needed for shortness of breath or wheezing. Notify if symptoms persist despite rescue inhaler/neb use. Use your neb treatments at least twice daily until symptoms improve. Follow with flutter valve  Continue Trelegy 1 puff daily. Brush tongue and rinse mouth afterwards.  Continue daliresp 500 mcg daily   Augmentin 1 tab Twice daily for 7 days. Take with food Prednisone taper. 4 tabs for 3 days, then 3 tabs for 3 days, 2 tabs for 3 days, then 1 tab for 3 days, then stop. Take in AM with food  Restart Guaifenesin [phone] mg Twice daily for chest congestion  Follow up in 3-4 weeks with Dr. Francine Graven or Philis Nettle. If symptoms do not improve or worsen, please contact office for sooner follow up or seek emergency care.

## 2023-06-12 MED ORDER — TRELEGY ELLIPTA 100-62.5-25 MCG/ACT IN AEPB: 1.0000 | INHALATION_SPRAY | Freq: Every day | RESPIRATORY_TRACT | 3 refills | Status: DC

## 2023-06-12 NOTE — Telephone Encounter (Signed)
Refill sent.

## 2023-06-19 ENCOUNTER — Telehealth: Payer: Self-pay | Admitting: Nurse Practitioner

## 2023-06-19 NOTE — Telephone Encounter (Signed)
He improved some.Still having chest tightness and coughing. The yellow phlegm has cleared up .   Gary Wright can you please advise.  Thank you

## 2023-06-19 NOTE — Telephone Encounter (Signed)
Spoke with patient regarding prior message   LOV 06/11/23. States he is tapering off prednisone as instructed but that he feels the flair up is still present. Has tightness in chest, pain when he coughs and coughing . [facility] [address]   Patient stated he still has 3 days left of the Prednisone. Patient stated he has been using his inhalers and nebs as directed .  Florentina Addison can you please advise. Thank you

## 2023-06-19 NOTE — Telephone Encounter (Signed)
Did he improve with the higher dose? Is cough still productive with yellow phlegm or has this cleared up?

## 2023-06-19 NOTE — Telephone Encounter (Signed)
He can go back to 30 mg of prednisone for 5 days then 20 mg for 5 days then 10 mg for 5 days then 5 mg for 5 days then stop. Take in AM with food.

## 2023-06-20 MED ORDER — PREDNISONE 10 MG PO TABS: 10.0000 mg | ORAL_TABLET | Freq: Every day | ORAL | 0 refills | Status: DC

## 2023-06-20 NOTE — Telephone Encounter (Signed)
ATC X1 LVM for patient to call our office back

## 2023-06-20 NOTE — Telephone Encounter (Signed)
I called and spoke with the pt and notified of response per [facility]  He verbalized understanding  Nothing further needed

## 2023-06-20 NOTE — Telephone Encounter (Signed)
Pt. Calling back to speak to nurse 

## 2023-06-30 ENCOUNTER — Ambulatory Visit (INDEPENDENT_AMBULATORY_CARE_PROVIDER_SITE_OTHER): Payer: Medicare Other | Admitting: Nurse Practitioner

## 2023-06-30 DIAGNOSIS — Z87891 Personal history of nicotine dependence: Secondary | ICD-10-CM

## 2023-06-30 NOTE — Patient Instructions (Signed)

## 2023-06-30 NOTE — Progress Notes (Signed)
Virtual Visit via Telephone Note  I connected with Oris Drone on 06/30/23 at  1:00 PM EDT by telephone and verified that I am speaking with the correct person using two identifiers.  Location: remote Patient: Oris Drone Provider: Selmer Dominion   Shared Decision Making Visit Lung Cancer Screening Program [phone])   Eligibility: Age 77 y.o. Pack Years Smoking History Calculation 46 (# packs/per year x # years smoked) Recent History of coughing up blood  no Unexplained weight loss? no ( >Than 15 pounds within the last 6 months ) Prior History Lung / other cancer yes (Diagnosis within the last 5 years already requiring surveillance chest CT Scans). Smoking Status Former Smoker Former Smokers: Years since quit: 5 years  Quit Date: 2019  Visit Components: Discussion included one or more decision making aids. yes Discussion included risk/benefits of screening. yes Discussion included potential follow up diagnostic testing for abnormal scans. yes Discussion included meaning and risk of over diagnosis. yes Discussion included meaning and risk of False Positives. yes Discussion included meaning of total radiation exposure. yes  Counseling Included: Importance of adherence to annual lung cancer LDCT screening. yes Impact of comorbidities on ability to participate in the program. yes Ability and willingness to under diagnostic treatment. yes  Smoking Cessation Counseling: Former Smokers:  Discussed the importance of maintaining cigarette abstinence. yes Diagnosis Code: Personal History of Nicotine Dependence. X91.478 Information about tobacco cessation classes and interventions provided to patient. Discussed    Posey Boyer, NP

## 2023-07-04 ENCOUNTER — Ambulatory Visit (HOSPITAL_BASED_OUTPATIENT_CLINIC_OR_DEPARTMENT_OTHER)
Admission: RE | Admit: 2023-07-04 | Discharge: 2023-07-04 | Disposition: A | Discharge status: Home / Self Care | Payer: Medicare Other | Source: Ambulatory Visit | Attending: Family | Admitting: Family

## 2023-07-04 DIAGNOSIS — Z122 Encounter for screening for malignant neoplasm of respiratory organs: Secondary | ICD-10-CM | POA: Insufficient documentation

## 2023-07-04 DIAGNOSIS — Z87891 Personal history of nicotine dependence: Secondary | ICD-10-CM | POA: Diagnosis present

## 2023-07-07 NOTE — Progress Notes (Deleted)
HPI: FU coronary artery disease. Patient previously admitted with a non-ST elevation myocardial infarction. Cardiac catheterization 06/13/2016 showed mild diffuse nonobstructive coronary disease. Ejection fraction was 25-30% and felt consistent with tako tsubo CM. Echocardiogram 2 days later showed normal LV function and mild right ventricular enlargement. Abdominal ultrasound February 2021 showed no aneurysm.  Patient seen with shortness of breath and chest pressure June 2023.  He was felt to have acute COPD exacerbation and was treated with steroids and nebulizers.  Troponin 261 and 240.  Echocardiogram June 2023 showed ejection fraction 40 to 45%, inferior hypokinesis.  Nuclear study July 2023 showed ejection fraction 57%, inferior infarct but no ischemia.  Since last seen      Past Medical History:  Diagnosis Date   Acute dyspnea 03/24/2022   Acute exacerbation of chronic obstructive pulmonary disease (COPD) (HCC) 03/25/2022   CAD (coronary artery disease) of artery bypass graft 06/13/2016   Chest pain    COPD (chronic obstructive pulmonary disease) (HCC)    Depression 09/01/2007   Qualifier: Diagnosis of  By: Genelle Gather CMA, [place]     Elevated troponin 06/13/2016   History of lung cancer 11/02/2020   Hyperlipidemia 11/02/2020   Hypertension    Prolonged QT interval 11/02/2020   Stress-induced cardiomyopathy 06/13/2016    Past Surgical History:  Procedure Laterality Date   BACK SURGERY     fusion in 1973   CARDIAC CATHETERIZATION N/A 06/13/2016   Procedure: Left Heart Cath and Coronary Angiography;  Surgeon: Yvonne Kendall, MD;  Location[facility] INVASIVE CV LAB;  Service: Cardiovascular;  Laterality: N/A;   INGUINAL HERNIA REPAIR Right 04/21/2018   Procedure: RIGHT INGUINAL HERNIA REPAIR ERAS PATHWAY;  Surgeon: Harriette Bouillon, MD;  Location: [facility] OR;  Service: General;  Laterality: Right;  TAP BLOCK   INSERTION OF MESH Right 04/21/2018   Procedure: INSERTION OF MESH;  Surgeon: Harriette Bouillon, MD;  Location: [facility] OR;  Service: General;  Laterality: Right;   TONSILLECTOMY      Social History   Socioeconomic History   Marital status: Legally Separated    Spouse name: Not on file   Number of children: Not on file   Years of education: Not on file   Highest education level: Not on file  Occupational History   Not on file  Tobacco Use   Smoking status: Former    Current packs/day: 0.00    Types: Cigarettes    Quit date: 2019    Years since quitting: 5.7   Smokeless tobacco: Never  Vaping Use   Vaping status: Never Used  Substance and Sexual Activity   Alcohol use: No    Comment: No alcohol for 17 years   Drug use: No   Sexual activity: Not on file  Other Topics Concern   Not on file  Social History Narrative   Not on file   Social Determinants of Health   Financial Resource Strain: Not on file  Food Insecurity: Not on file  Transportation Needs: Not on file  Physical Activity: Not on file  Stress: Not on file  Social Connections: Not on file  Intimate Partner Violence: Not on file    Family History  Problem Relation Age of Onset   Hypertension Mother    Hypertension Father     ROS: no fevers or chills, productive cough, hemoptysis, dysphasia, odynophagia, melena, hematochezia, dysuria, hematuria, rash, seizure activity, orthopnea, PND, pedal edema, claudication. Remaining systems are negative.  Physical Exam: Well-developed well-nourished in no acute distress.  Skin is warm and dry.  HEENT is normal.  Neck is supple.  Chest is clear to auscultation with normal expansion.  Cardiovascular exam is regular rate and rhythm.  Abdominal exam nontender or distended. No masses palpated. Extremities show no edema. neuro grossly intact  ECG- personally reviewed  A/P  1.  Elevated troponin-patient was seen previously with a COPD flare and improved with bronchodilators and steroids.  His troponin was noted to be mildly elevated felt likely secondary  to demand ischemia.  Follow-up nuclear study showed inferior infarct but no ischemia.  LV function was mildly reduced.  He has been treated medically and denies recurrent chest pain.  2 history of mildly reduced LV function-plan to continue lisinopril and Toprol.  Repeat echocardiogram.  3 hypertension-blood pressure controlled.  Continue present medications.  4 hyperlipidemia-continue Lipitor and Zetia.  5 history of coronary artery disease-continue aspirin and statin.  Olga Millers, MD

## 2023-07-12 IMAGING — DX DG CHEST 1V PORT
1 series · 1 of 1 positions shown · non-contrast
Comparison: March 24, 2022.

CLINICAL DATA: Shortness of breath.

EXAM:
PORTABLE CHEST 1 VIEW

[chest ap]
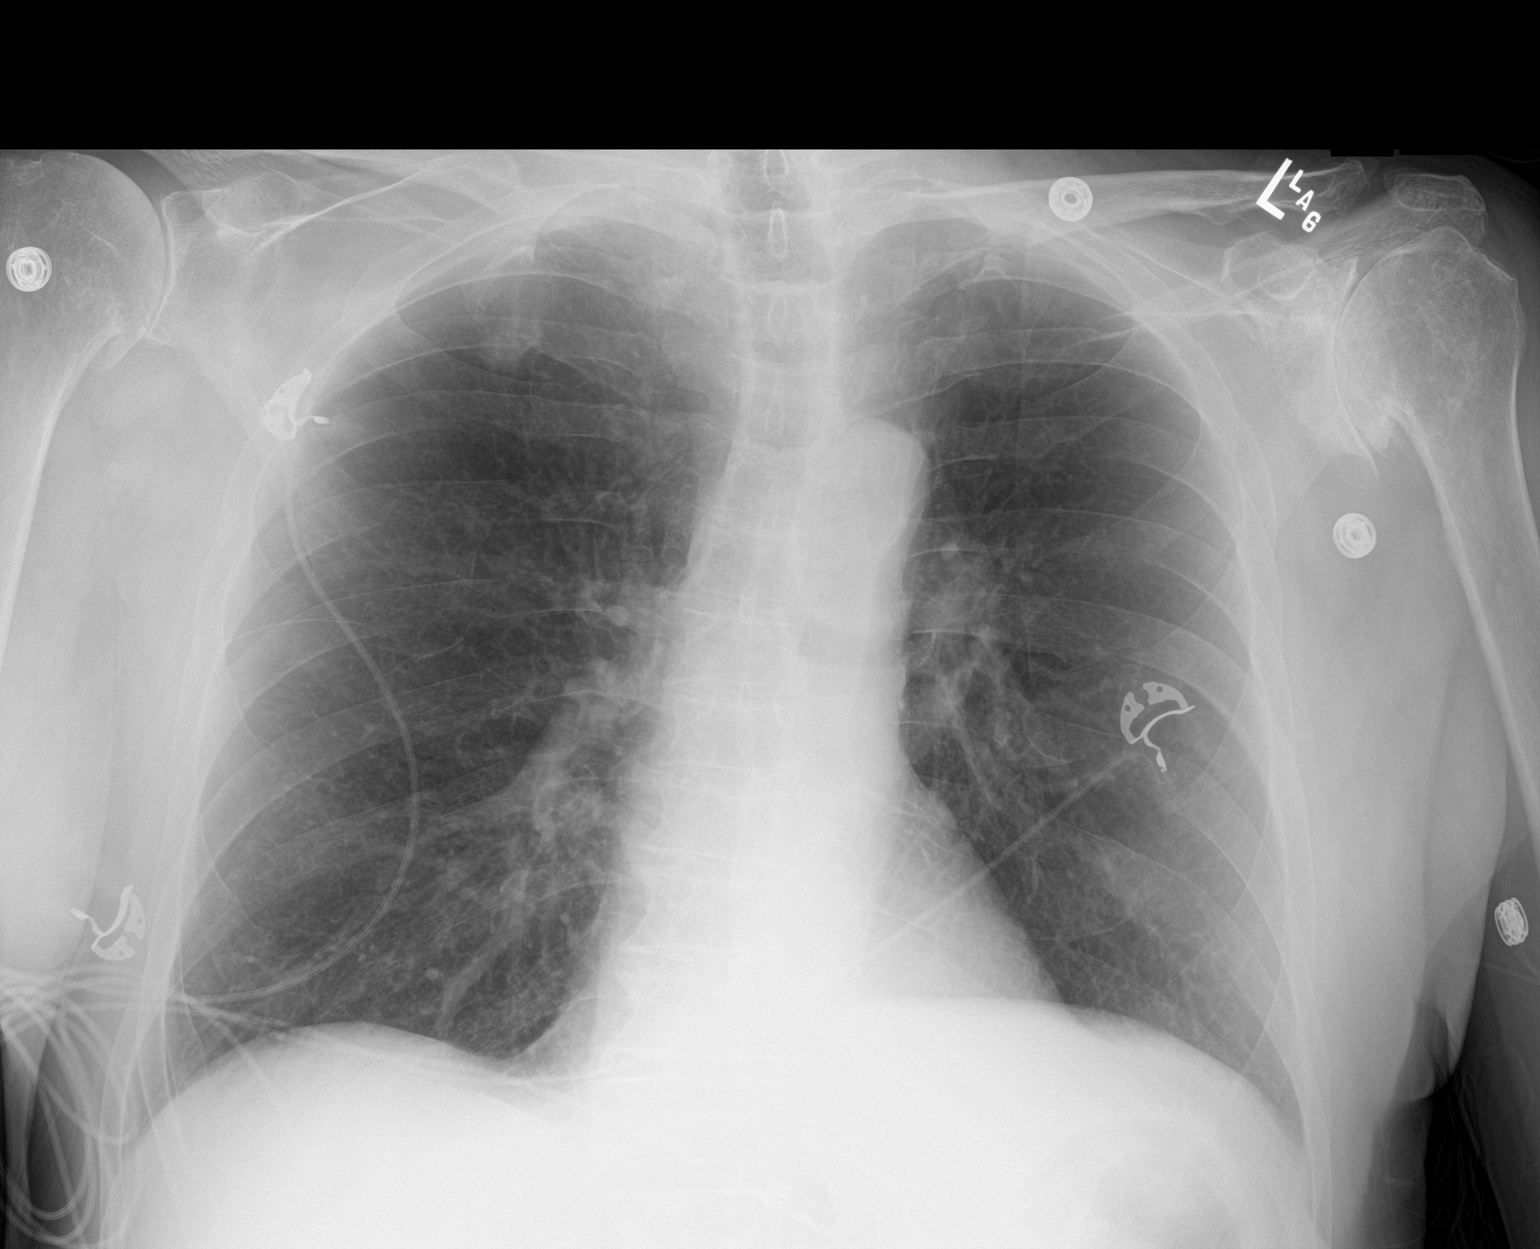

[1 of 1 positions shown; findings below may reference images not displayed]

FINDINGS: No consolidation. No visible pleural effusions or pneumothorax.
Cardiomediastinal silhouette is within normal limits. Severe left
greater than right shoulder degenerative change, partially imaged.
IMPRESSION: No evidence of acute cardiopulmonary disease.

## 2023-07-13 IMAGING — DX DG CHEST 1V PORT
1 series · 1 of 1 positions shown · non-contrast
Comparison: March 26, 2022

CLINICAL DATA: Shortness of breath

EXAM:
PORTABLE CHEST 1 VIEW

[chest ap]
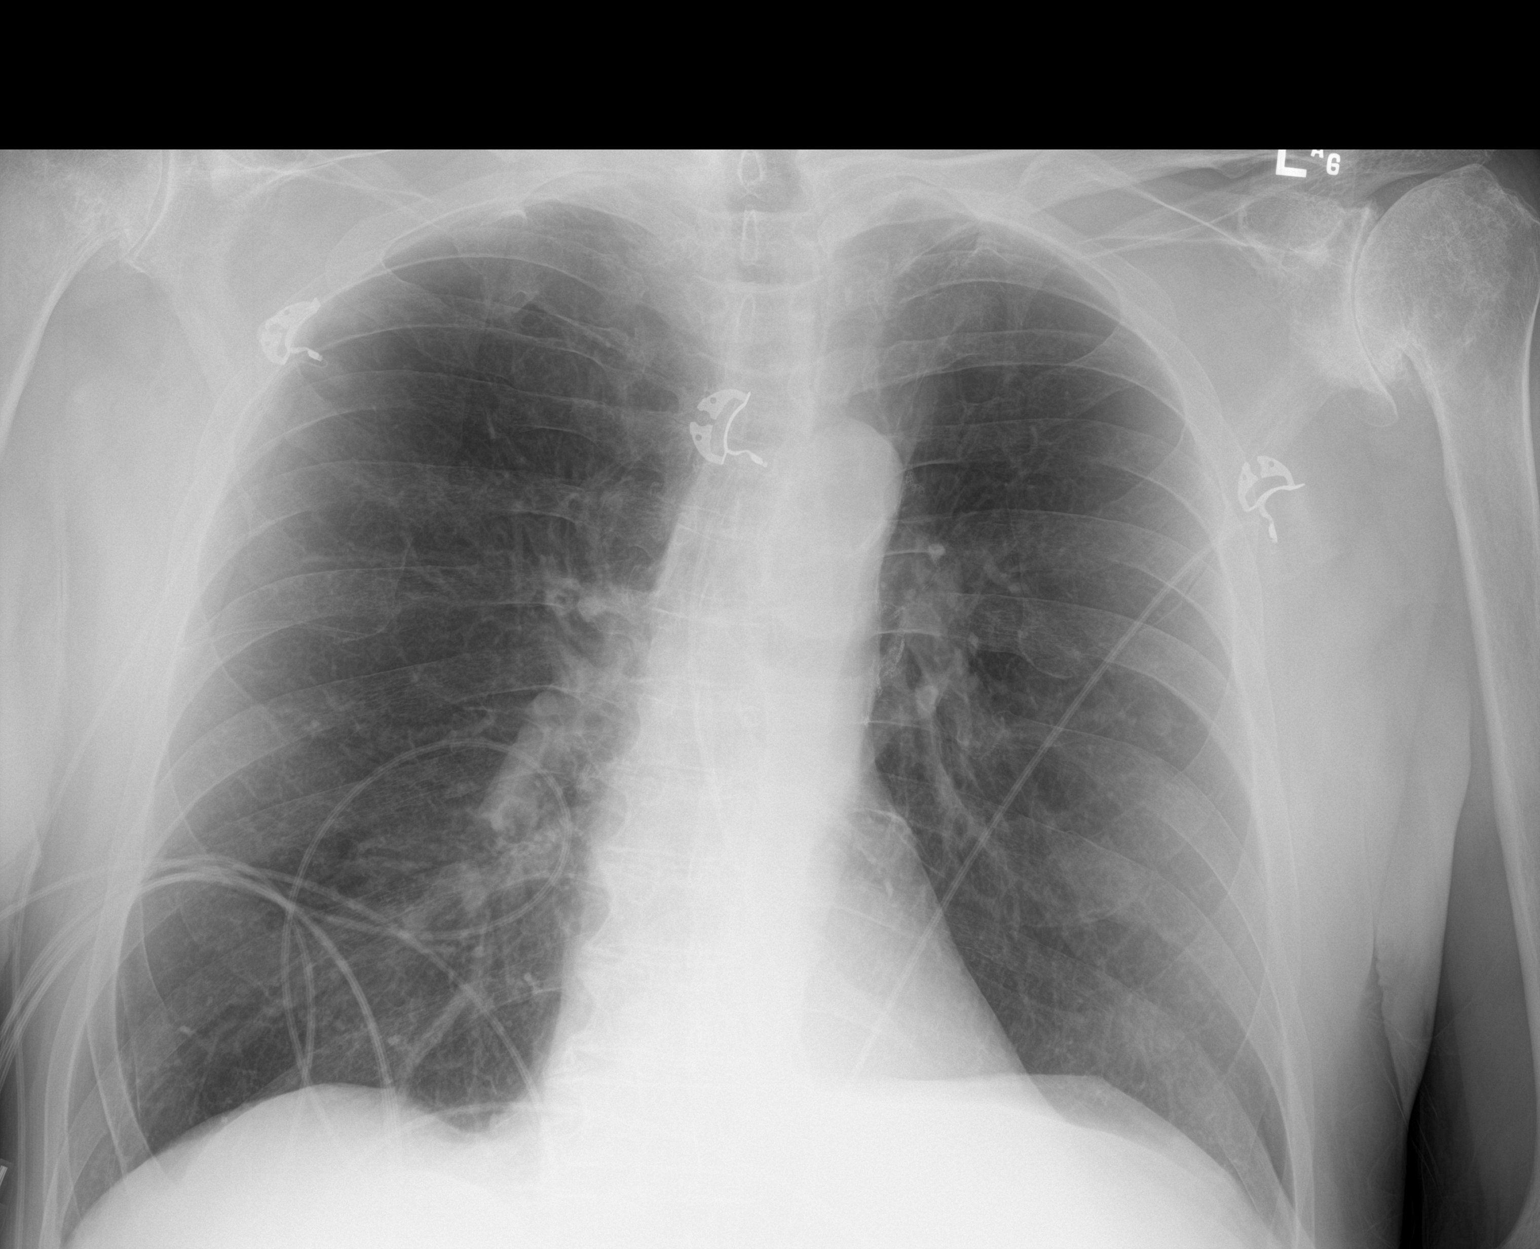

[1 of 1 positions shown; findings below may reference images not displayed]

FINDINGS: The heart size and mediastinal contours are within normal limits.
Both lungs are clear. Lungs are hyperinflated. The visualized
skeletal structures are stable.
IMPRESSION: No active cardiopulmonary disease. Emphysema.

## 2023-07-15 NOTE — Progress Notes (Unsigned)
Cardiology Clinic Note   Patient Name: Gary Wright Date of Encounter: 07/17/2023  Primary Care Provider:  Dulce Sellar, NP Primary Cardiologist:  Olga Millers, MD  Patient Profile    77 year old male NSTEMI 2017 with LHC, 06/13/2016 showed mild diffuse nonobstructive coronary disease. Ejection fraction was 25-30% and felt consistent with tako tsubo CM.  Echocardiogram 2 days later showed normal LV function and mild right ventricular enlargement. Other h/o HTN, HL, CM, COPD. Last seen by Dr. Jens Som on 08/30/2022  Past Medical History    Past Medical History:  Diagnosis Date   Acute dyspnea 03/24/2022   Acute exacerbation of chronic obstructive pulmonary disease (COPD) (HCC) 03/25/2022   CAD (coronary artery disease) of artery bypass graft 06/13/2016   Chest pain    COPD (chronic obstructive pulmonary disease) (HCC)    Depression 09/01/2007   Qualifier: Diagnosis of  By: Genelle Gather CMA, [place]     Elevated troponin 06/13/2016   History of lung cancer 11/02/2020   Hyperlipidemia 11/02/2020   Hypertension    Prolonged QT interval 11/02/2020   Stress-induced cardiomyopathy 06/13/2016   Past Surgical History:  Procedure Laterality Date   BACK SURGERY     fusion in 1973   CARDIAC CATHETERIZATION N/A 06/13/2016   Procedure: Left Heart Cath and Coronary Angiography;  Surgeon: Yvonne Kendall, MD;  Location: [facility] INVASIVE CV LAB;  Service: Cardiovascular;  Laterality: N/A;   INGUINAL HERNIA REPAIR Right 04/21/2018   Procedure: RIGHT INGUINAL HERNIA REPAIR ERAS PATHWAY;  Surgeon: Harriette Bouillon, MD;  Location: [facility] OR;  Service: General;  Laterality: Right;  TAP BLOCK   INSERTION OF MESH Right 04/21/2018   Procedure: INSERTION OF MESH;  Surgeon: Harriette Bouillon, MD;  Location: [facility] OR;  Service: General;  Laterality: Right;   TONSILLECTOMY      Allergies  No Known Allergies  History of Present Illness    [name] comes today without any cardiac complaints.  He has had 2  admissions for COPD exacerbation.  He continues to be followed by pulmonology and has been recently placed on new medication Roflumilast which has been helpful in his breathing status, he has also been started on prednisone Dosepak.  He states that his breathing had become progressively worse and with new medication changes by pulmonology he has done much better.  He denies any chest pain, but he does have chest pressure associated with his COPD.  He denies any volume overload symptoms of edema PND or orthopnea, he denies any palpitations, dizziness, or near syncope.    Home Medications    Current Outpatient Medications  Medication Sig Dispense Refill   albuterol (PROVENTIL) (2.5 MG/3ML) 0.083% nebulizer solution USE 1 VIAL VIA NEBULIZER FOUR TIMES DAILY AS NEEDED 360 mL 2   albuterol (VENTOLIN HFA) 108 (90 Base) MCG/ACT inhaler INHALE 2 PUFFS BY MOUTH EVERY 4 TO 6 HOURS AS NEEDED 20.1 g 1   aspirin 81 MG chewable tablet CHEW AND SWALLOW 1 TABLET(81 MG) BY MOUTH DAILY (Patient taking differently: Chew 81 mg by mouth daily at 12 noon.) 30 tablet 2   atorvastatin (LIPITOR) 40 MG tablet TAKE 1 TABLET(40 MG) BY MOUTH DAILY AT 6 PM 90 tablet 3   buPROPion (WELLBUTRIN XL) 300 MG 24 hr tablet Take 300 mg by mouth every morning.  1   citalopram (CELEXA) 40 MG tablet Take 40 mg by mouth every morning.  0   fluticasone (FLONASE) 50 MCG/ACT nasal spray Place 2 sprays into both nostrils daily. 18.2 mL 2  Fluticasone-Umeclidin-Vilant (TRELEGY ELLIPTA) 100-62.5-25 MCG/ACT AEPB Inhale 1 puff into the lungs daily at 6 (six) AM. 180 each 3   metoprolol succinate (TOPROL-XL) 25 MG 24 hr tablet TAKE 1/2 TABLET(12.5 MG) BY MOUTH DAILY 45 tablet 6   olmesartan (BENICAR) 40 MG tablet Take 1 tablet (40 mg total) by mouth daily. 30 tablet 11   predniSONE (DELTASONE) 10 MG tablet 4 tabs for 5 days, then 3 tabs for 5 days, 2 tabs for 5 days, then 1 tab for 5 days, then stop. 50 tablet 0   roflumilast (DALIRESP) 500 MCG  TABS tablet Take 1 tablet (500 mcg total) by mouth daily. 30 tablet 11   No current facility-administered medications for this visit.     Family History    Family History  Problem Relation Age of Onset   Hypertension Mother    Hypertension Father    He indicated that his mother is deceased. He indicated that his father is deceased.  Social History    Social History   Socioeconomic History   Marital status: Legally Separated    Spouse name: Not on file   Number of children: Not on file   Years of education: Not on file   Highest education level: Not on file  Occupational History   Not on file  Tobacco Use   Smoking status: Former    Current packs/day: 0.00    Types: Cigarettes    Quit date: 2019    Years since quitting: 5.7   Smokeless tobacco: Never  Vaping Use   Vaping status: Never Used  Substance and Sexual Activity   Alcohol use: No    Comment: No alcohol for 17 years   Drug use: No   Sexual activity: Not on file  Other Topics Concern   Not on file  Social History Narrative   Not on file   Social Determinants of Health   Financial Resource Strain: Not on file  Food Insecurity: Not on file  Transportation Needs: Not on file  Physical Activity: Not on file  Stress: Not on file  Social Connections: Not on file  Intimate Partner Violence: Not on file     Review of Systems    General:  No chills, fever, night sweats or weight changes.  Cardiovascular:  No chest pain, chronic dyspnea on exertion, no edema, orthopnea, palpitations, paroxysmal nocturnal dyspnea. Dermatological: No rash, lesions/masses Respiratory: No cough, chronic dyspnea Urologic: No hematuria, dysuria Abdominal:   No nausea, vomiting, diarrhea, bright red blood per rectum, melena, or hematemesis Neurologic:  No visual changes, wkns, changes in mental status. All other systems reviewed and are otherwise negative except as noted above.  EKG Interpretation Date/Time:  Thursday July 17 2023 14:31:26 EDT Ventricular Rate:  99 PR Interval:  152 QRS Duration:  126 QT Interval:  376 QTC Calculation: 482 R Axis:   36  Text Interpretation: Normal sinus rhythm Right bundle branch block When compared with ECG of 24-Mar-2022 12:07, PREVIOUS ECG IS PRESENT Confirmed by Joni Reining [phone]) on 07/17/2023 2:48:41 PM    Physical Exam    VS:  BP 110/64   Pulse 96   Ht 5\' 8"  (1.727 m)   Wt 180 lb (81.6 kg)   BMI 27.37 kg/m  , BMI Body mass index is 27.37 kg/m.     GEN: Well nourished, well developed, in no acute distress. HEENT: normal. Neck: Supple, no JVD, carotid bruits, or masses. Cardiac: RRR, no murmurs, rubs, or gallops. No clubbing, cyanosis, edema.  Radials/DP/PT 2+ and equal bilaterally.  Respiratory:  Respirations regular and unlabored, clear to auscultation bilaterally. No wheezes or crackles.  GI: Soft, nontender, nondistended, BS + x 4. MS: no deformity or atrophy. Skin: warm and dry, no rash. Neuro:  Strength and sensation are intact. Psych: Normal affect.  EKG Interpretation Date/Time:  Thursday July 17 2023 14:31:26 EDT Ventricular Rate:  99 PR Interval:  152 QRS Duration:  126 QT Interval:  376 QTC Calculation: 482 R Axis:   36  Text Interpretation: Normal sinus rhythm Right bundle branch block When compared with ECG of 24-Mar-2022 12:07, PREVIOUS ECG IS PRESENT Confirmed by Joni Reining [phone]) on 07/17/2023 2:48:41 PM   Lab Results  Component Value Date   WBC 9.5 07/16/2023   HGB 14.3 07/16/2023   HCT 44.2 07/16/2023   MCV 94.3 07/16/2023   PLT 289.0 07/16/2023   Lab Results  Component Value Date   CREATININE 1.45 12/30/2022   BUN 22 12/30/2022   NA 137 12/30/2022   K 4.5 12/30/2022   CL 102 12/30/2022   CO2 29 12/30/2022   Lab Results  Component Value Date   ALT 23 12/30/2022   AST 16 12/30/2022   ALKPHOS 70 12/30/2022   BILITOT 0.4 12/30/2022   Lab Results  Component Value Date   CHOL 150 12/11/2022   HDL  75 12/11/2022   LDLCALC 58 12/11/2022   TRIG 91 12/11/2022   CHOLHDL 2.0 12/11/2022    Lab Results  Component Value Date   HGBA1C 6.1 (A) 01/29/2023     Review of Prior Studies EKG Interpretation Date/Time:  Thursday July 17 2023 14:31:26 EDT Ventricular Rate:  99 PR Interval:  152 QRS Duration:  126 QT Interval:  376 QTC Calculation: 482 R Axis:   36  Text Interpretation: Normal sinus rhythm Right bundle branch block When compared with ECG of 24-Mar-2022 12:07, PREVIOUS ECG IS PRESENT Confirmed by Joni Reining [phone]) on 07/17/2023 2:48:41 PM  NM Stress Test 05/03/2022  Findings are consistent with prior myocardial infarction. The study is intermediate risk.   No ST deviation was noted.   Nuclear stress EF: 57 %. The left ventricular ejection fraction is normal (55-65%). End diastolic cavity size is normal.   Prior study not available for comparison.  Echocardiogram 03/24/2022 1. Wall motion is challenging to asses.  inferior/inferoseptal/inferolateral wall appears hypokinetic base-apex.  Left ventricular ejection fraction, by estimation, is 40 to 45%. The left  ventricle has mildly decreased function. Left ventricular diastolic   parameters are indeterminate.   2. Right ventricular systolic function is normal. The right ventricular  size is normal.   3. The mitral valve is normal in structure. No evidence of mitral valve  regurgitation.   4. There is mild calcification of the aortic valve. Aortic valve  regurgitation is not visualized.   5. The inferior vena cava is normal in size with greater than 50%  respiratory variability, suggesting right atrial pressure of 3 mmHg.   Assessment & Plan   1.  CAD: Cardiac catheterization 2017 showed mild diffuse nonobstructive coronary artery disease.  Repeat echocardiogram 03/24/2022 revealed EF of 40 to 45% compared to prior echocardiogram with normal LV systolic function.  I am going to repeat his echocardiogram to compare 1  year ago to evaluate for changes in LV systolic function with worsening COPD symptoms to ascertain whether breathing status is also related to worsening structural heart disease.  For now, the patient will continue secondary prevention with blood pressure control, lipid  management, weight management, and purposeful exercise as he can with his breathing status.  2.  Chronic COPD: Being followed by pulmonology and is due to follow-up with [facility] nurse practitioner on July 28, 2023 for ongoing management.  Currently his lungs are clear on steroid taper.  3.  Hypertension: Blood pressure is very well-controlled today.  Will not make any changes in his medication regimen.  4. HFmRF: Repeating echocardiogram for comparison due to worsening shortness of breath in the setting of COPD and known reduced EF 1 year ago.        Signed, Bettey Mare. Liborio Nixon, ANP, AACC   07/17/2023 2:48 PM      Office [phone] Fax [phone]  Notice: This dictation was prepared with Dragon dictation along with smaller phrase technology. Any transcriptional errors that result from this process are unintentional and may not be corrected upon review.

## 2023-07-16 ENCOUNTER — Encounter: Payer: Self-pay | Admitting: Nurse Practitioner

## 2023-07-16 ENCOUNTER — Ambulatory Visit: Payer: Medicare Other | Admitting: Nurse Practitioner

## 2023-07-16 ENCOUNTER — Ambulatory Visit: Payer: Medicare Other | Admitting: Cardiology

## 2023-07-16 VITALS — BP 102/70 | HR 109 | Temp 97.9°F | Ht 68.0 in | Wt 183.4 lb

## 2023-07-16 DIAGNOSIS — Z87891 Personal history of nicotine dependence: Secondary | ICD-10-CM

## 2023-07-16 DIAGNOSIS — J302 Other seasonal allergic rhinitis: Secondary | ICD-10-CM | POA: Diagnosis not present

## 2023-07-16 DIAGNOSIS — J441 Chronic obstructive pulmonary disease with (acute) exacerbation: Secondary | ICD-10-CM

## 2023-07-16 LAB — CBC WITH DIFFERENTIAL/PLATELET
Basophils Absolute: 0 10*3/uL (ref 0.0–0.1)
Basophils Relative: 0.4 % (ref 0.0–3.0)
Eosinophils Absolute: 0.2 10*3/uL (ref 0.0–0.7)
Eosinophils Relative: 2.1 % (ref 0.0–5.0)
HCT: 44.2 % (ref 39.0–52.0)
Hemoglobin: 14.3 g/dL (ref 13.0–17.0)
Lymphocytes Relative: 15.7 % (ref 12.0–46.0)
Lymphs Abs: 1.5 10*3/uL (ref 0.7–4.0)
MCHC: 32.3 g/dL (ref 30.0–36.0)
MCV: 94.3 fl (ref 78.0–100.0)
Monocytes Absolute: 0.7 10*3/uL (ref 0.1–1.0)
Monocytes Relative: 7.2 % (ref 3.0–12.0)
Neutro Abs: 7.1 10*3/uL (ref 1.4–7.7)
Neutrophils Relative %: 74.6 % (ref 43.0–77.0)
Platelets: 289 10*3/uL (ref 150.0–400.0)
RBC: 4.69 Mil/uL (ref 4.22–5.81)
RDW: 13.9 % (ref 11.5–15.5)
WBC: 9.5 10*3/uL (ref 4.0–10.5)

## 2023-07-16 MED ORDER — METHYLPREDNISOLONE ACETATE 80 MG/ML IJ SUSP
120.0000 mg | Freq: Once | INTRAMUSCULAR | Status: AC
Start: 2023-07-16 — End: 2023-07-16
  Administered 2023-07-16: 120 mg via INTRAMUSCULAR

## 2023-07-16 MED ORDER — PREDNISONE 10 MG PO TABS
ORAL_TABLET | ORAL | 0 refills | Status: DC
Start: 2023-07-16 — End: 2023-08-21

## 2023-07-16 MED ORDER — FLUTICASONE PROPIONATE 50 MCG/ACT NA SUSP: 2.0000 | Freq: Every day | NASAL | 2 refills | Status: DC

## 2023-07-16 NOTE — Assessment & Plan Note (Signed)
Recurrent AECOPD.  Will restart him on extended prednisone taper.  Depo inj 120 mg x 1. Question allergic component.  Check IgE and peripheral eosinophils.  Target sinus symptoms with intranasal steroid.  May need to consider biologic therapy if peripheral eosinophils are greater than 300 given recurrent exacerbations. Action plan in place. Close follow up. Strict ED precautions. VS stable on room air.   Patient Instructions  Continue Albuterol inhaler 2 puffs or 3 mL neb every 6 hours as needed for shortness of breath or wheezing. Notify if symptoms persist despite rescue inhaler/neb use. Use your neb treatments at least twice daily until symptoms improve. Follow with flutter valve  Continue Trelegy 1 puff daily. Brush tongue and rinse mouth afterwards.  Continue daliresp 500 mcg daily   -Restart Guaifenesin [phone] mg Twice daily for chest congestion -Prednisone taper. 4 tabs for 5 days, then 3 tabs for 5 days, 2 tabs for 5 days, then 1 tab for 5 days, then stop. Start tomorrow. Take in AM with food  -Flonase nasal spray 2 sprays each nostril daily -Over the counter allergy pill such as claritin, zyrtec, xyzal or allegra daily during allergy season   Labs today    Follow up in 2 weeks with Dr. Francine Graven or Katie Pheonix Wisby,NP. Ok to double book [facility] if no availability with Dr. Francine Graven. If symptoms do not improve or worsen, please contact office for sooner follow up or seek emergency care.

## 2023-07-16 NOTE — Patient Instructions (Addendum)
Continue Albuterol inhaler 2 puffs or 3 mL neb every 6 hours as needed for shortness of breath or wheezing. Notify if symptoms persist despite rescue inhaler/neb use. Use your neb treatments at least twice daily until symptoms improve. Follow with flutter valve  Continue Trelegy 1 puff daily. Brush tongue and rinse mouth afterwards.  Continue daliresp 500 mcg daily   -Restart Guaifenesin [phone] mg Twice daily for chest congestion -Prednisone taper. 4 tabs for 5 days, then 3 tabs for 5 days, 2 tabs for 5 days, then 1 tab for 5 days, then stop. Start tomorrow. Take in AM with food  -Flonase nasal spray 2 sprays each nostril daily -Over the counter allergy pill such as claritin, zyrtec, xyzal or allegra daily during allergy season   Labs today    Follow up in 2 weeks with Dr. Francine Graven or Katie Joliene Salvador,NP. Ok to double book [facility] if no availability with Dr. Francine Graven. If symptoms do not improve or worsen, please contact office for sooner follow up or seek emergency care.

## 2023-07-16 NOTE — Progress Notes (Unsigned)
@Patient  ID: Gary Wright, male    DOB: 1945-12-19, 77 y.o.   MRN: [id number]  Chief Complaint  Patient presents with   Follow-up    Referring provider: Dulce Sellar, NP  HPI: 77 year old male, former smoker followed for COPD.  He is a patient of Dr. Lanora Manis and last seen in office 06/11/2023 by Dr. Sherene Sires for acute visit.  Past medical history significant for CAD, hypertension, HLD.  He does have a history of lung cancer status post left upper lobectomy 10/30/2018.  TEST/EVENTS:  05/2022 ONO: 1 minute 37 seconds less than 88% 09/11/2022 PFT: FVC 70, FEV1 52, ratio 57, TLC 96, DLCOunc 65.  Moderately severe obstructive disease with reversibility (15% improvement) 09/11/2022 CXR: Postop change from left upper lobectomy.  No acute process. 07/04/2023 LDCT chest lung cancer screening: Atherosclerosis.  Centrilobular and paraseptal emphysema.  Left upper lobectomy.  Calcified left lower lobe granuloma.  Airway otherwise remarkable.  No acute process.  Lung RADS 1.  Punctate left renal stone.  05/23/2022: OV with Dr. Francine Graven.  Admitted 6/4 to 6/7 for COPD exacerbation.  He has had ongoing dyspnea.  Does have some wheezing intermittently.  Has a cough with some phlegm, light brown in color.  Does have oxygen equipment at home but is not using it.  Symptoms are his baseline.  He will continue on Trelegy and as needed [place].  Order Lindell Spar.  Referred to lung cancer screening team.  Scheduled pulmonary function testing in 3 months.  09/11/2022: Ov with Chimere Klingensmith NP for follow-up after undergoing pulmonary function testing which revealed moderately severe obstructive defect with reversibility and moderate diffusion defect.  He has been doing okay since he was here last.  He does feel like the Trelegy helped him; having some trouble with insurance coverage.  He plans to switch the beginning of the year and was told that it should only cost $58 a month.  Unfortunately, he has been having some acute symptoms over  the last 2 weeks.  Has had an increased productive cough with brown sputum, more wheezing and feels little more short of breath.  Also having some chest congestion.  Denies any hemoptysis, fevers, chills, night sweats, lower extremity swelling.  No interim sick exposures.  No URI symptoms.  Feeling a little more fatigued and weak. Eating and drinking well. Not using any over-the-counter cough syrups. Treated for AECOPD with prednisone and empiric doxycycline.  09/25/2022: OV with Brendan Gadson NP for follow up after being treated for AECOPD. Symptoms have improved today. His cough has mostly resolved. Feels like his breathing is back to his baseline. He's still having some fatigue and weakness, but this is slowly improving. Just not quite back to normal. He denies fevers, chills, hemoptysis, lower extremity swelling, wheezing. He is using Trelegy; feels like this works well for him. Still taking mucinex.   12/06/2022: OV with Clorinda Wyble NP for intended follow-up.  Unfortunately having some acute symptoms as well.  Over the last week, he has developed a increased productive cough with brown to yellow phlegm.  He also has felt more short winded and has noticed himself wheezing more.  Feels like his chest is a little bit tighter with deep breaths.  He denies any fevers, chills, hemoptysis, lower extremity swelling, orthopnea.  He is eating and drinking well.  He is using his Trelegy inhaler daily and neb treatments 3 times a day.  He is not currently taking Mucinex. Treated with prednisone taper and doxycyline course.  12/20/2022: OV with Lashaya Kienitz NP  for follow up after being treated for AECOPD. He has had multiple exacerbations over the past 6-8 months. He was hospitalized in June for AECOPD, treated outpatient in November and again in February. Feeling better today. Cough has cleared up and minimally productive; occasionally still having some white to tan phlegm. Breathing feels back to his baseline. No more chest congestion or  wheezing. Using his Trelegy daily. Decision was made at his last OV to start him on daliresp once he improves.   01/30/2023: Ov with Dr. Francine Graven. Coughing less and having less mucus production. Continue Trelegy and daliresp. F/u 6 months  06/02/2023: OV with Dr. Sherene Sires for acute symptoms with productive cough. Treated with prednisone and z pack. He was also changed from lisinopril to olmesartan to see if he had any improvement in cough.  06/11/2023: Ov with Makaylyn Sinyard NP for acute visit.  Still having trouble with a productive cough with yellow phlegm.  He felt some better on the Z-Pak and steroids but then he came off of them and his symptoms quickly returned.  He actually feels worse than he did before.  He is feeling more short winded than his baseline.  Having a lot of chest congestion and wheezing.  He also just feels overall fatigued and rundown.  He has not has any low oxygen levels at home.  He denies any fevers, chills, hemoptysis.  Eating and drinking well.  Using his Trelegy daily.  He is also using his neb treatments twice a day.  Not currently taking Mucinex.  He is still on the Daliresp which she has been tolerating well.  Prior to this, he had not had a flare since February 2024 and starting Daliresp.  07/16/2023: Today-follow-up Patient presents today for follow-up.  At her last visit, we treated him for AECOPD with Augmentin and prednisone taper.  He was also advised on mucociliary clearance therapies.  He contacted the office around 8 weeks later with reports of chest tightness and coughing.  He did feel like they had improved some and the yellow phlegm have resolved.  He was advised to increase his steroids for 5 days and then taper as directed.  Today, he tells me that he was feeling better up until the past week or so.  Started having more trouble with his breathing again.  Feels like his chest is tighter and he is also having little bit more wheezing.  He did have some yellow phlegm yesterday but  this morning it was clear.  Denies any fevers, chills, hemoptysis, anorexia, lower extremity swelling, orthopnea.  Using albuterol a few times a day.  Lung cancer screening CT without acute process or suspicious nodules/masses.  No Known Allergies  Immunization History  Administered Date(s) Administered   Influenza, High Dose Seasonal PF 07/21/2018   Influenza-Unspecified 07/10/2022   PFIZER(Purple Top)SARS-COV-2 Vaccination 12/31/2019, 01/24/2020   Pneumococcal Polysaccharide-23 06/14/2016   Pneumococcal-Unspecified 09/10/2022   Respiratory Syncytial Virus Vaccine,Recomb Aduvanted(Arexvy) 09/10/2022    Past Medical History:  Diagnosis Date   Acute dyspnea 03/24/2022   Acute exacerbation of chronic obstructive pulmonary disease (COPD) (HCC) 03/25/2022   CAD (coronary artery disease) of artery bypass graft 06/13/2016   Chest pain    COPD (chronic obstructive pulmonary disease) (HCC)    Depression 09/01/2007   Qualifier: Diagnosis of  By: Genelle Gather CMA, [place]     Elevated troponin 06/13/2016   History of lung cancer 11/02/2020   Hyperlipidemia 11/02/2020   Hypertension    Prolonged QT interval 11/02/2020   Stress-induced  cardiomyopathy 06/13/2016    Tobacco History: Social History   Tobacco Use  Smoking Status Former   Current packs/day: 0.00   Types: Cigarettes   Quit date: 2019   Years since quitting: 5.7  Smokeless Tobacco Never   Counseling given: Not Answered   Outpatient Medications Prior to Visit  Medication Sig Dispense Refill   albuterol (PROVENTIL) (2.5 MG/3ML) 0.083% nebulizer solution USE 1 VIAL VIA NEBULIZER FOUR TIMES DAILY AS NEEDED 360 mL 2   albuterol (VENTOLIN HFA) 108 (90 Base) MCG/ACT inhaler INHALE 2 PUFFS BY MOUTH EVERY 4 TO 6 HOURS AS NEEDED 20.1 g 1   aspirin 81 MG chewable tablet CHEW AND SWALLOW 1 TABLET(81 MG) BY MOUTH DAILY (Patient taking differently: Chew 81 mg by mouth daily at 12 noon.) 30 tablet 2   atorvastatin (LIPITOR) 40 MG tablet  TAKE 1 TABLET(40 MG) BY MOUTH DAILY AT 6 PM 90 tablet 3   buPROPion (WELLBUTRIN XL) 300 MG 24 hr tablet Take 300 mg by mouth every morning.  1   citalopram (CELEXA) 40 MG tablet Take 40 mg by mouth every morning.  0   Fluticasone-Umeclidin-Vilant (TRELEGY ELLIPTA) 100-62.5-25 MCG/ACT AEPB Inhale 1 puff into the lungs daily at 6 (six) AM. 180 each 3   metoprolol succinate (TOPROL-XL) 25 MG 24 hr tablet TAKE 1/2 TABLET(12.5 MG) BY MOUTH DAILY 45 tablet 6   olmesartan (BENICAR) 40 MG tablet Take 1 tablet (40 mg total) by mouth daily. 30 tablet 11   roflumilast (DALIRESP) 500 MCG TABS tablet Take 1 tablet (500 mcg total) by mouth daily. 30 tablet 11   predniSONE (DELTASONE) 10 MG tablet 4 tabs for 3 days, then 3 tabs for 3 days, 2 tabs for 3 days, then 1 tab for 3 days, then stop (Patient not taking: Reported on 07/16/2023) 30 tablet 0   predniSONE (DELTASONE) 10 MG tablet Take 1 tablet (10 mg total) by mouth daily with breakfast. (Patient not taking: Reported on 07/16/2023) 23 tablet 0   No facility-administered medications prior to visit.     Review of Systems:   Constitutional: No weight loss or gain, night sweats, fevers, chills. +fatigue, lassitude  HEENT: No headaches, difficulty swallowing, tooth/dental problems, or sore throat. No sneezing, itching, ear ache. +nasal congestion, post nasal drip CV:  No chest pain, orthopnea, PND, swelling in lower extremities, anasarca, dizziness, palpitations, syncope Resp: +shortness of breath with exertion; productive cough; wheezing.  No hemoptysis. No chest wall deformity GI:  No heartburn, indigestion, abdominal pain, nausea, vomiting, diarrhea, change in bowel habits, loss of appetite, bloody stools.  MSK:  No joint pain or swelling.   Neuro: No dizziness or lightheadedness.  Psych: No depression or anxiety. Mood stable.     Physical Exam:  BP 102/70 (BP Location: Right Arm, Patient Position: Sitting, Cuff Size: Normal)   Pulse (!) 109   Temp  97.9 F (36.6 C) (Oral)   Ht 5\' 8"  (1.727 m)   Wt 183 lb 6.4 oz (83.2 kg)   SpO2 100%   BMI 27.89 kg/m   GEN: Pleasant, interactive, well-kempt; in no acute distress. HEENT:  Normocephalic and atraumatic. PERRLA. Sclera white. Nasal turbinates pink, moist and patent bilaterally. No rhinorrhea present. Oropharynx pink and moist, without exudate or edema. No lesions, ulcerations, or postnasal drip.  NECK:  Supple w/ fair ROM. No JVD present. Normal carotid impulses w/o bruits. Thyroid symmetrical with no goiter or nodules palpated. No lymphadenopathy.   CV: RRR, no m/r/g, no peripheral edema. Pulses intact, +  2 bilaterally. No cyanosis, pallor or clubbing. PULMONARY:  Unlabored, regular breathing. Scattered wheezing bilaterally A&P. Congested cough. No accessory muscle use.  GI: BS present and normoactive. Soft, non-tender to palpation. No organomegaly or masses detected. MSK: No erythema, warmth or tenderness. Cap refil <2 sec all extrem. No deformities or joint swelling noted.  Neuro: A/Ox3. No focal deficits noted.   Skin: Warm, no lesions or rashe Psych: Normal affect and behavior. Judgement and thought content appropriate.     Lab Results:  CBC    Component Value Date/Time   WBC 9.5 07/16/2023 1518   RBC 4.69 07/16/2023 1518   HGB 14.3 07/16/2023 1518   HCT 44.2 07/16/2023 1518   PLT 289.0 07/16/2023 1518   MCV 94.3 07/16/2023 1518   MCH 31.1 03/27/2022 0331   MCHC 32.3 07/16/2023 1518   RDW 13.9 07/16/2023 1518   LYMPHSABS 1.5 07/16/2023 1518   MONOABS 0.7 07/16/2023 1518   EOSABS 0.2 07/16/2023 1518   BASOSABS 0.0 07/16/2023 1518    BMET    Component Value Date/Time   NA 137 12/30/2022 1413   NA 139 01/18/2021 1423   K 4.5 12/30/2022 1413   CL 102 12/30/2022 1413   CO2 29 12/30/2022 1413   GLUCOSE 127 (H) 12/30/2022 1413   BUN 22 12/30/2022 1413   BUN 14 01/18/2021 1423   CREATININE 1.45 12/30/2022 1413   CREATININE 0.87 07/01/2016 1032   CALCIUM 9.2  12/30/2022 1413   GFRNONAA >60 03/27/2022 0331   GFRAA 57 (L) 12/14/2019 1025    BNP    Component Value Date/Time   BNP 80.6 03/24/2022 1120     Imaging:  CT CHEST LUNG CA SCREEN LOW DOSE W/O CM  Result Date: 07/16/2023 CLINICAL DATA:  Former smoker, quit 2019, 46 pack-year history. EXAM: CT CHEST WITHOUT CONTRAST LOW-DOSE FOR LUNG CANCER SCREENING TECHNIQUE: Multidetector CT imaging of the chest was performed following the standard protocol without IV contrast. RADIATION DOSE REDUCTION: This exam was performed according to the departmental dose-optimization program which includes automated exposure control, adjustment of the mA and/or kV according to patient size and/or use of iterative reconstruction technique. COMPARISON:  None Available. FINDINGS: Cardiovascular: Atherosclerotic calcification of the aorta, aortic valve and coronary arteries. Heart size normal. No pericardial effusion. Mediastinum/Nodes: No pathologically enlarged mediastinal or axillary lymph nodes. Hilar regions are difficult to definitively evaluate without IV contrast. There may be distal esophageal wall thickening which can be seen with gastroesophageal reflux disease. Lungs/Pleura: Centrilobular and paraseptal emphysema. Left upper lobectomy. Calcified left lower lobe granuloma. No pleural fluid. Airway is otherwise unremarkable. Upper Abdomen: Visualized portions of the liver, gallbladder, adrenal glands and right kidney are unremarkable. Punctate left renal stone. Visualized portions of the spleen, pancreas, stomach and bowel are grossly unremarkable. No upper abdominal adenopathy. Musculoskeletal: Degenerative changes in the spine. Left thoracotomies. No worrisome lytic or sclerotic lesions. IMPRESSION: 1. Lung-RADS 1, negative. Continue annual screening with low-dose chest CT without contrast in 12 months. 2. Punctate left renal stone. 3. Aortic atherosclerosis (ICD10-I70.0). Coronary artery calcification. 4.  Emphysema  (ICD10-J43.9). Electronically Signed   By: Leanna Battles M.D.   On: 07/16/2023 14:59    methylPREDNISolone acetate (DEPO-MEDROL) injection 120 mg     Date Action Dose Route User   07/16/2023 1526 Given 120 mg Intramuscular (Left Upper Outer Quadrant) Lanna Poche, CMA          Latest Ref Rng & Units 09/11/2022    9:47 AM  PFT Results  FVC-Pre  L 2.96   FVC-Predicted Pre % 70   FVC-Post L 3.22   FVC-Predicted Post % 76   Pre FEV1/FVC % % 54   Post FEV1/FCV % % 57   FEV1-Pre L 1.58   FEV1-Predicted Pre % 52   FEV1-Post L 1.83   DLCO uncorrected ml/min/mmHg 16.42   DLCO UNC% % 65   DLCO corrected ml/min/mmHg 16.42   DLCO COR %Predicted % 65   DLVA Predicted % 78   TLC L 6.78   TLC % Predicted % 96   RV % Predicted % 132     No results found for: "NITRICOXIDE"      Assessment & Plan:   COPD with acute exacerbation (HCC) Recurrent AECOPD.  Will restart him on extended prednisone taper.  Depo inj 120 mg x 1. Question allergic component.  Check IgE and peripheral eosinophils.  Target sinus symptoms with intranasal steroid.  May need to consider biologic therapy if peripheral eosinophils are greater than 300 given recurrent exacerbations. Action plan in place. Close follow up. Strict ED precautions. VS stable on room air.   Patient Instructions  Continue Albuterol inhaler 2 puffs or 3 mL neb every 6 hours as needed for shortness of breath or wheezing. Notify if symptoms persist despite rescue inhaler/neb use. Use your neb treatments at least twice daily until symptoms improve. Follow with flutter valve  Continue Trelegy 1 puff daily. Brush tongue and rinse mouth afterwards.  Continue daliresp 500 mcg daily   -Restart Guaifenesin [phone] Twice daily for chest congestion -Prednisone taper. 4 tabs for 5 days, then 3 tabs for 5 days, 2 tabs for 5 days, then 1 tab for 5 days, then stop. Start tomorrow. Take in AM with food  -Flonase nasal spray 2 sprays each nostril  daily -Over the counter allergy pill such as claritin, zyrtec, xyzal or allegra daily during allergy season   Labs today    Follow up in 2 weeks with Dr. Francine Graven or Katie Paul Trettin,NP. Ok to double book [place] if no availability with Dr. Francine Graven. If symptoms do not improve or worsen, please contact office for sooner follow up or seek emergency care.   Allergic rhinitis See above  Former smoker Recent LDCT chest - Lung RADS 1. Next CT due 06/2024   I spent 35 minutes of dedicated to the care of this patient on the date of this encounter to include pre-visit review of records, face-to-face time with the patient discussing conditions above, post visit ordering of testing, clinical documentation with the electronic health record, making appropriate referrals as documented, and communicating necessary findings to members of the patients care team.  Noemi Chapel, NP 07/17/2023  Pt aware and understands NP's role.

## 2023-07-17 ENCOUNTER — Encounter: Payer: Self-pay | Admitting: Nurse Practitioner

## 2023-07-17 ENCOUNTER — Ambulatory Visit: Payer: Medicare Other | Attending: Cardiology | Admitting: Adult Health

## 2023-07-17 ENCOUNTER — Other Ambulatory Visit: Payer: Self-pay

## 2023-07-17 ENCOUNTER — Encounter: Payer: Self-pay | Admitting: Adult Health

## 2023-07-17 VITALS — BP 110/64 | HR 96 | Ht 68.0 in | Wt 180.0 lb

## 2023-07-17 DIAGNOSIS — Z87891 Personal history of nicotine dependence: Secondary | ICD-10-CM

## 2023-07-17 DIAGNOSIS — J439 Emphysema, unspecified: Secondary | ICD-10-CM

## 2023-07-17 DIAGNOSIS — I2510 Atherosclerotic heart disease of native coronary artery without angina pectoris: Secondary | ICD-10-CM

## 2023-07-17 DIAGNOSIS — J309 Allergic rhinitis, unspecified: Secondary | ICD-10-CM | POA: Insufficient documentation

## 2023-07-17 DIAGNOSIS — I10 Essential (primary) hypertension: Secondary | ICD-10-CM | POA: Diagnosis not present

## 2023-07-17 DIAGNOSIS — I5022 Chronic systolic (congestive) heart failure: Secondary | ICD-10-CM | POA: Diagnosis not present

## 2023-07-17 DIAGNOSIS — Z122 Encounter for screening for malignant neoplasm of respiratory organs: Secondary | ICD-10-CM

## 2023-07-17 LAB — IGE: IgE (Immunoglobulin E), Serum: 510 kU/L — ABNORMAL HIGH (ref <=114)

## 2023-07-17 NOTE — Patient Instructions (Addendum)
Medication Instructions:  NO CHANGES     Lab Work: NONE   Testing/Procedures: [address]  Your physician has requested that you have an echocardiogram. Echocardiography is a painless test that uses sound waves to create images of your heart. It provides your doctor with information about the size and shape of your heart and how well your heart's chambers and valves are working. This procedure takes approximately one hour. There are no restrictions for this procedure. Please do NOT wear cologne, perfume, aftershave, or lotions (deodorant is allowed). Please arrive 15 minutes prior to your appointment time.    Follow-Up: At [facility], you and your health needs are our priority.  As part of our continuing mission to provide you with exceptional heart care, we have created designated Provider Care Teams.  These Care Teams include your primary Cardiologist (physician) and Advanced Practice Providers (APPs -  Physician Assistants and Nurse Practitioners) who all work together to provide you with the care you need, when you need it.    Your next appointment:   1 year(s)  Provider:   Olga Millers, MD

## 2023-07-17 NOTE — Assessment & Plan Note (Signed)
Recent LDCT chest - Lung RADS 1. Next CT due 06/2024

## 2023-07-17 NOTE — Assessment & Plan Note (Signed)
See above

## 2023-07-28 ENCOUNTER — Ambulatory Visit: Payer: Medicare Other | Admitting: Nurse Practitioner

## 2023-07-28 ENCOUNTER — Encounter: Payer: Self-pay | Admitting: Nurse Practitioner

## 2023-07-28 VITALS — BP 120/70 | HR 99 | Ht 68.0 in | Wt 186.4 lb

## 2023-07-28 DIAGNOSIS — J441 Chronic obstructive pulmonary disease with (acute) exacerbation: Secondary | ICD-10-CM | POA: Diagnosis not present

## 2023-07-28 DIAGNOSIS — Z87891 Personal history of nicotine dependence: Secondary | ICD-10-CM

## 2023-07-28 DIAGNOSIS — J309 Allergic rhinitis, unspecified: Secondary | ICD-10-CM | POA: Diagnosis not present

## 2023-07-28 DIAGNOSIS — J4550 Severe persistent asthma, uncomplicated: Secondary | ICD-10-CM | POA: Diagnosis not present

## 2023-07-28 DIAGNOSIS — J449 Chronic obstructive pulmonary disease, unspecified: Secondary | ICD-10-CM

## 2023-07-28 DIAGNOSIS — J45909 Unspecified asthma, uncomplicated: Secondary | ICD-10-CM | POA: Insufficient documentation

## 2023-07-28 MED ORDER — MONTELUKAST SODIUM 10 MG PO TABS
10.0000 mg | ORAL_TABLET | Freq: Every day | ORAL | 11 refills | Status: DC
Start: 2023-07-28 — End: 2023-09-24

## 2023-07-28 NOTE — Assessment & Plan Note (Signed)
Stable CT chest without any suspicious masses/nodules. No acute process. Lung RADS 1. Continue with lung cancer screening program.

## 2023-07-28 NOTE — Patient Instructions (Addendum)
-  Continue Albuterol inhaler 2 puffs or 3 mL neb every 6 hours as needed for shortness of breath or wheezing. Notify if symptoms persist despite rescue inhaler/neb use. Use your neb treatments at least twice daily until symptoms improve. Follow with flutter valve  -Stop Trelegy. Trial change to Breztri 2 puffs Twice daily. Brush tongue and rinse mouth afterwards. Let me know if this helps and I will update your prescription. Otherwise, you can go back to Trelegy   -Continue daliresp 500 mcg daily -Continue daily allergy pill -Stop flonase and if you notice that your sinus symptoms return, restart   Continue Guaifenesin [phone] mg Twice daily for chest congestion Complete prednisone 2 tabs daily then stay on 1 tab daily until seen back   Start singulair 1 tab At bedtime   I will discuss next steps with Dr. Francine Graven and let you know if he thinks that an injectable medicine for your asthma/COPD is worth trying   Follow up with Dr. Francine Graven 11/8. If symptoms do not improve or worsen, please contact office for sooner follow up or seek emergency care.

## 2023-07-28 NOTE — Assessment & Plan Note (Addendum)
Slow to resolve AECOPD.  Suspect he has overlapping asthma with allergic phenotype contributing to his symptoms.  He had reversibility on previous pulmonary function testing and has significantly elevated IgE.  Tends to have issues in fall and spring from what he has noticed.  Mild bronchospasm on exam.  Will have him remain on low-dose daily steroids until he is seen back.  Started on Singulair for trigger prevention and to help reduce airway inflammation.  May consider biologic therapy.  Will discuss with Dr. Francine Graven.  He is already on phosphodiesterase inhibitor with Daliresp.  Trial changed from Trelegy to [facility] to see if [facility] style inhaler benefit him more so than DPI.  Possible that he is unable to generate the airflow for DPI.  Action plan in place.  Return precautions advised.  Encouraged to work on graded exercises.  Patient Instructions  -Continue Albuterol inhaler 2 puffs or 3 mL neb every 6 hours as needed for shortness of breath or wheezing. Notify if symptoms persist despite rescue inhaler/neb use. Use your neb treatments at least twice daily until symptoms improve. Follow with flutter valve  -Stop Trelegy. Trial change to Breztri 2 puffs Twice daily. Brush tongue and rinse mouth afterwards. Let me know if this helps and I will update your prescription. Otherwise, you can go back to Trelegy   -Continue daliresp 500 mcg daily -Continue daily allergy pill -Stop flonase and if you notice that your sinus symptoms return, restart   Continue Guaifenesin [phone] mg Twice daily for chest congestion Complete prednisone 2 tabs daily then stay on 1 tab daily until seen back   Start singulair 1 tab At bedtime   I will discuss next steps with Dr. Francine Graven and let you know if he thinks that an injectable medicine for your asthma/COPD is worth trying   Follow up with Dr. Francine Graven 11/8. If symptoms do not improve or worsen, please contact office for sooner follow up or seek emergency care.

## 2023-07-28 NOTE — Assessment & Plan Note (Signed)
See above

## 2023-07-28 NOTE — Assessment & Plan Note (Signed)
Compensated on current regimen

## 2023-07-28 NOTE — Progress Notes (Signed)
@Patient  ID: Gary Wright, male    DOB: 1946/09/18, 77 y.o.   MRN: [id number]  Chief Complaint  Patient presents with   Follow-up    Pt is here for F/U visit. Pt states he has SOB and Cough. Has mucus in lungs but can't get it to come up.    Referring provider: Dulce Sellar, NP  HPI: 77 year old male, former smoker followed for COPD.  He is a patient of Dr. Lanora Manis and last seen in office 07/16/2023 by Allison Quarry, NP.  Past medical history significant for CAD, hypertension, HLD.  He does have a history of lung cancer status post left upper lobectomy 10/30/2018.  TEST/EVENTS:  05/2022 ONO: 1 minute 37 seconds less than 88% 09/11/2022 PFT: FVC 70, FEV1 52, ratio 57, TLC 96, DLCOunc 65.  Moderately severe obstructive disease with reversibility (15% improvement) 09/11/2022 CXR: Postop change from left upper lobectomy.  No acute process. 07/04/2023 LDCT chest lung cancer screening: Atherosclerosis.  Centrilobular and paraseptal emphysema.  Left upper lobectomy.  Calcified left lower lobe granuloma.  Airway otherwise remarkable.  No acute process.  Lung RADS 1.  Punctate left renal stone.  05/23/2022: OV with Dr. Francine Graven.  Admitted 6/4 to 6/7 for COPD exacerbation.  He has had ongoing dyspnea.  Does have some wheezing intermittently.  Has a cough with some phlegm, light brown in color.  Does have oxygen equipment at home but is not using it.  Symptoms are his baseline.  He will continue on Trelegy and as needed [place].  Order Lindell Spar.  Referred to lung cancer screening team.  Scheduled pulmonary function testing in 3 months.  09/11/2022: Ov with Avory Rahimi NP for follow-up after undergoing pulmonary function testing which revealed moderately severe obstructive defect with reversibility and moderate diffusion defect.  He has been doing okay since he was here last.  He does feel like the Trelegy helped him; having some trouble with insurance coverage.  He plans to switch the beginning of the year and was told that  it should only cost $58 a month.  Unfortunately, he has been having some acute symptoms over the last 2 weeks.  Has had an increased productive cough with brown sputum, more wheezing and feels little more short of breath.  Also having some chest congestion.  Denies any hemoptysis, fevers, chills, night sweats, lower extremity swelling.  No interim sick exposures.  No URI symptoms.  Feeling a little more fatigued and weak. Eating and drinking well. Not using any over-the-counter cough syrups. Treated for AECOPD with prednisone and empiric doxycycline.  09/25/2022: OV with Tyreon Frigon NP for follow up after being treated for AECOPD. Symptoms have improved today. His cough has mostly resolved. Feels like his breathing is back to his baseline. He's still having some fatigue and weakness, but this is slowly improving. Just not quite back to normal. He denies fevers, chills, hemoptysis, lower extremity swelling, wheezing. He is using Trelegy; feels like this works well for him. Still taking mucinex.   12/06/2022: OV with Kiala Faraj NP for intended follow-up.  Unfortunately having some acute symptoms as well.  Over the last week, he has developed a increased productive cough with brown to yellow phlegm.  He also has felt more short winded and has noticed himself wheezing more.  Feels like his chest is a little bit tighter with deep breaths.  He denies any fevers, chills, hemoptysis, lower extremity swelling, orthopnea.  He is eating and drinking well.  He is using his Trelegy inhaler daily and neb treatments  3 times a day.  He is not currently taking Mucinex. Treated with prednisone taper and doxycyline course.  12/20/2022: OV with Kentrail Shew NP for follow up after being treated for AECOPD. He has had multiple exacerbations over the past 6-8 months. He was hospitalized in June for AECOPD, treated outpatient in November and again in February. Feeling better today. Cough has cleared up and minimally productive; occasionally still having  some white to tan phlegm. Breathing feels back to his baseline. No more chest congestion or wheezing. Using his Trelegy daily. Decision was made at his last OV to start him on daliresp once he improves.   01/30/2023: Ov with Dr. Francine Graven. Coughing less and having less mucus production. Continue Trelegy and daliresp. F/u 6 months  06/02/2023: OV with Dr. Sherene Sires for acute symptoms with productive cough. Treated with prednisone and z pack. He was also changed from lisinopril to olmesartan to see if he had any improvement in cough.  06/11/2023: Ov with Enas Winchel NP for acute visit.  Still having trouble with a productive cough with yellow phlegm.  He felt some better on the Z-Pak and steroids but then he came off of them and his symptoms quickly returned.  He actually feels worse than he did before.  He is feeling more short winded than his baseline.  Having a lot of chest congestion and wheezing.  He also just feels overall fatigued and rundown.  He has not has any low oxygen levels at home.  He denies any fevers, chills, hemoptysis.  Eating and drinking well.  Using his Trelegy daily.  He is also using his neb treatments twice a day.  Not currently taking Mucinex.  He is still on the Daliresp which she has been tolerating well.  Prior to this, he had not had a flare since February 2024 and starting Daliresp.  07/16/2023: OV with Anthonee Gelin NP for follow-up.  At her last visit, we treated him for AECOPD with Augmentin and prednisone taper.  He was also advised on mucociliary clearance therapies.  He contacted the office around 8 weeks later with reports of chest tightness and coughing.  He did feel like they had improved some and the yellow phlegm have resolved.  He was advised to increase his steroids for 5 days and then taper as directed.  Today, he tells me that he was feeling better up until the past week or so.  Started having more trouble with his breathing again.  Feels like his chest is tighter and he is also having  little bit more wheezing.  He did have some yellow phlegm yesterday but this morning it was clear.  Denies any fevers, chills, hemoptysis, anorexia, lower extremity swelling, orthopnea.  Using albuterol a few times a day.  Lung cancer screening CT without acute process or suspicious nodules/masses.  07/28/2023: Today - follow up Patient presents today for follow-up.  We treated him for unresolved AECOPD at his last visit.  He has been having a lot of difficulties over the last few months with recurrent exacerbations.  He is still currently on prednisone taper that was prescribed at last visit.  We also obtained testing to assess for allergic component and possibility of using biologic therapy to help better control him.  He had significantly elevated IgE, consistent with allergic component.  Eosinophils were 200. Today, he tells me that he is feeling a little bit better but still does not quite feel back to his baseline.  He did start a daily allergy pill.  He is on prednisone, 20 mg and planning to taper down to 10 mg tomorrow.  He is not coughing very much.  Does still have some chest tightness and wheezing.  Denies any fevers, chills, mops this, lower extremity swelling, orthopnea.  Recent low-dose lung cancer screening CT with lung RADS 1.  Using his Trelegy in the morning and albuterol 1-2 times a day.  He still doing his nebs twice daily most days.  Takes Daliresp daily.  No Known Allergies  Immunization History  Administered Date(s) Administered   Influenza, High Dose Seasonal PF 07/21/2018   Influenza-Unspecified 07/10/2022   PFIZER(Purple Top)SARS-COV-2 Vaccination 12/31/2019, 01/24/2020   Pneumococcal Polysaccharide-23 06/14/2016   Pneumococcal-Unspecified 09/10/2022   Respiratory Syncytial Virus Vaccine,Recomb Aduvanted(Arexvy) 09/10/2022    Past Medical History:  Diagnosis Date   Acute dyspnea 03/24/2022   Acute exacerbation of chronic obstructive pulmonary disease (COPD) (HCC)  03/25/2022   CAD (coronary artery disease) of artery bypass graft 06/13/2016   Chest pain    COPD (chronic obstructive pulmonary disease) (HCC)    Depression 09/01/2007   Qualifier: Diagnosis of  By: Genelle Gather CMA, [place]     Elevated troponin 06/13/2016   History of lung cancer 11/02/2020   Hyperlipidemia 11/02/2020   Hypertension    Prolonged QT interval 11/02/2020   Stress-induced cardiomyopathy 06/13/2016    Tobacco History: Social History   Tobacco Use  Smoking Status Former   Current packs/day: 0.00   Types: Cigarettes   Quit date: 2019   Years since quitting: 5.7  Smokeless Tobacco Never   Counseling given: Not Answered   Outpatient Medications Prior to Visit  Medication Sig Dispense Refill   albuterol (PROVENTIL) (2.5 MG/3ML) 0.083% nebulizer solution USE 1 VIAL VIA NEBULIZER FOUR TIMES DAILY AS NEEDED 360 mL 2   albuterol (VENTOLIN HFA) 108 (90 Base) MCG/ACT inhaler INHALE 2 PUFFS BY MOUTH EVERY 4 TO 6 HOURS AS NEEDED 20.1 g 1   aspirin 81 MG chewable tablet CHEW AND SWALLOW 1 TABLET(81 MG) BY MOUTH DAILY (Patient taking differently: Chew 81 mg by mouth daily at 12 noon.) 30 tablet 2   atorvastatin (LIPITOR) 40 MG tablet TAKE 1 TABLET(40 MG) BY MOUTH DAILY AT 6 PM 90 tablet 3   buPROPion (WELLBUTRIN XL) 300 MG 24 hr tablet Take 300 mg by mouth every morning.  1   citalopram (CELEXA) 40 MG tablet Take 40 mg by mouth every morning.  0   fluticasone (FLONASE) 50 MCG/ACT nasal spray Place 2 sprays into both nostrils daily. 18.2 mL 2   Fluticasone-Umeclidin-Vilant (TRELEGY ELLIPTA) 100-62.5-25 MCG/ACT AEPB Inhale 1 puff into the lungs daily at 6 (six) AM. 180 each 3   metoprolol succinate (TOPROL-XL) 25 MG 24 hr tablet TAKE 1/2 TABLET(12.5 MG) BY MOUTH DAILY 45 tablet 6   olmesartan (BENICAR) 40 MG tablet Take 1 tablet (40 mg total) by mouth daily. 30 tablet 11   predniSONE (DELTASONE) 10 MG tablet 4 tabs for 5 days, then 3 tabs for 5 days, 2 tabs for 5 days, then 1 tab for  5 days, then stop. 50 tablet 0   roflumilast (DALIRESP) 500 MCG TABS tablet Take 1 tablet (500 mcg total) by mouth daily. 30 tablet 11   No facility-administered medications prior to visit.     Review of Systems:   Constitutional: No weight loss or gain, night sweats, fevers, chills. +fatigue, lassitude  HEENT: No headaches, difficulty swallowing, tooth/dental problems, or sore throat. No sneezing, itching, ear ache. +nasal congestion, post nasal drip  CV:  No chest pain, orthopnea, PND, swelling in lower extremities, anasarca, dizziness, palpitations, syncope Resp: +shortness of breath with exertion; improved cough; wheezing; chest tightness.  No hemoptysis. No chest wall deformity GI:  No heartburn, indigestion, abdominal pain, nausea, vomiting, diarrhea, change in bowel habits, loss of appetite, bloody stools.  MSK:  No joint pain or swelling.   Neuro: No dizziness or lightheadedness.  Psych: No depression or anxiety. Mood stable.     Physical Exam:  BP 120/70 (BP Location: Left Arm, Cuff Size: Normal)   Pulse 99   Ht 5\' 8"  (1.727 m)   Wt 186 lb 6.4 oz (84.6 kg)   SpO2 96%   BMI 28.34 kg/m   GEN: Pleasant, interactive, well-kempt; in no acute distress. HEENT:  Normocephalic and atraumatic. PERRLA. Sclera white. Nasal turbinates pink, moist and patent bilaterally. No rhinorrhea present. Oropharynx pink and moist, without exudate or edema. No lesions, ulcerations, or postnasal drip.  NECK:  Supple w/ fair ROM. No JVD present. Normal carotid impulses w/o bruits. Thyroid symmetrical with no goiter or nodules palpated. No lymphadenopathy.   CV: RRR, no m/r/g, no peripheral edema. Pulses intact, +2 bilaterally. No cyanosis, pallor or clubbing. PULMONARY:  Unlabored, regular breathing.  End expiratory wheezing bilaterally A&P. No accessory muscle use.  GI: BS present and normoactive. Soft, non-tender to palpation. No organomegaly or masses detected. MSK: No erythema, warmth or  tenderness. Cap refil <2 sec all extrem. No deformities or joint swelling noted.  Neuro: A/Ox3. No focal deficits noted.   Skin: Warm, no lesions or rashe Psych: Normal affect and behavior. Judgement and thought content appropriate.     Lab Results:  CBC    Component Value Date/Time   WBC 9.5 07/16/2023 1518   RBC 4.69 07/16/2023 1518   HGB 14.3 07/16/2023 1518   HCT 44.2 07/16/2023 1518   PLT 289.0 07/16/2023 1518   MCV 94.3 07/16/2023 1518   MCH 31.1 03/27/2022 0331   MCHC 32.3 07/16/2023 1518   RDW 13.9 07/16/2023 1518   LYMPHSABS 1.5 07/16/2023 1518   MONOABS 0.7 07/16/2023 1518   EOSABS 0.2 07/16/2023 1518   BASOSABS 0.0 07/16/2023 1518    BMET    Component Value Date/Time   NA 137 12/30/2022 1413   NA 139 01/18/2021 1423   K 4.5 12/30/2022 1413   CL 102 12/30/2022 1413   CO2 29 12/30/2022 1413   GLUCOSE 127 (H) 12/30/2022 1413   BUN 22 12/30/2022 1413   BUN 14 01/18/2021 1423   CREATININE 1.45 12/30/2022 1413   CREATININE 0.87 07/01/2016 1032   CALCIUM 9.2 12/30/2022 1413   GFRNONAA >60 03/27/2022 0331   GFRAA 57 (L) 12/14/2019 1025    BNP    Component Value Date/Time   BNP 80.6 03/24/2022 1120     Imaging:  CT CHEST LUNG CA SCREEN LOW DOSE W/O CM  Result Date: 07/16/2023 CLINICAL DATA:  Former smoker, quit 2019, 46 pack-year history. EXAM: CT CHEST WITHOUT CONTRAST LOW-DOSE FOR LUNG CANCER SCREENING TECHNIQUE: Multidetector CT imaging of the chest was performed following the standard protocol without IV contrast. RADIATION DOSE REDUCTION: This exam was performed according to the departmental dose-optimization program which includes automated exposure control, adjustment of the mA and/or kV according to patient size and/or use of iterative reconstruction technique. COMPARISON:  None Available. FINDINGS: Cardiovascular: Atherosclerotic calcification of the aorta, aortic valve and coronary arteries. Heart size normal. No pericardial effusion.  Mediastinum/Nodes: No pathologically enlarged mediastinal or axillary lymph nodes. Hilar regions  are difficult to definitively evaluate without IV contrast. There may be distal esophageal wall thickening which can be seen with gastroesophageal reflux disease. Lungs/Pleura: Centrilobular and paraseptal emphysema. Left upper lobectomy. Calcified left lower lobe granuloma. No pleural fluid. Airway is otherwise unremarkable. Upper Abdomen: Visualized portions of the liver, gallbladder, adrenal glands and right kidney are unremarkable. Punctate left renal stone. Visualized portions of the spleen, pancreas, stomach and bowel are grossly unremarkable. No upper abdominal adenopathy. Musculoskeletal: Degenerative changes in the spine. Left thoracotomies. No worrisome lytic or sclerotic lesions. IMPRESSION: 1. Lung-RADS 1, negative. Continue annual screening with low-dose chest CT without contrast in 12 months. 2. Punctate left renal stone. 3. Aortic atherosclerosis (ICD10-I70.0). Coronary artery calcification. 4.  Emphysema (ICD10-J43.9). Electronically Signed   By: Leanna Battles M.D.   On: 07/16/2023 14:59    methylPREDNISolone acetate (DEPO-MEDROL) injection 120 mg     Date Action Dose Route User   07/16/2023 1526 Given 120 mg Intramuscular (Left Upper Outer Quadrant) Lanna Poche, CMA          Latest Ref Rng & Units 09/11/2022    9:47 AM  PFT Results  FVC-Pre L 2.96   FVC-Predicted Pre % 70   FVC-Post L 3.22   FVC-Predicted Post % 76   Pre FEV1/FVC % % 54   Post FEV1/FCV % % 57   FEV1-Pre L 1.58   FEV1-Predicted Pre % 52   FEV1-Post L 1.83   DLCO uncorrected ml/min/mmHg 16.42   DLCO UNC% % 65   DLCO corrected ml/min/mmHg 16.42   DLCO COR %Predicted % 65   DLVA Predicted % 78   TLC L 6.78   TLC % Predicted % 96   RV % Predicted % 132     No results found for: "NITRICOXIDE"      Assessment & Plan:   COPD with acute exacerbation (HCC) Slow to resolve AECOPD.  Suspect he has  overlapping asthma with allergic phenotype contributing to his symptoms.  He had reversibility on previous pulmonary function testing and has significantly elevated IgE.  Tends to have issues in fall and spring from what he has noticed.  Mild bronchospasm on exam.  Will have him remain on low-dose daily steroids until he is seen back.  Started on Singulair for trigger prevention and to help reduce airway inflammation.  May consider biologic therapy.  Will discuss with Dr. Francine Graven.  He is already on phosphodiesterase inhibitor with Daliresp.  Trial changed from Trelegy to [facility] to see if [facility] style inhaler benefit him more so than DPI.  Possible that he is unable to generate the airflow for DPI.  Action plan in place.  Return precautions advised.  Encouraged to work on graded exercises.  Patient Instructions  -Continue Albuterol inhaler 2 puffs or 3 mL neb every 6 hours as needed for shortness of breath or wheezing. Notify if symptoms persist despite rescue inhaler/neb use. Use your neb treatments at least twice daily until symptoms improve. Follow with flutter valve  -Stop Trelegy. Trial change to Breztri 2 puffs Twice daily. Brush tongue and rinse mouth afterwards. Let me know if this helps and I will update your prescription. Otherwise, you can go back to Trelegy   -Continue daliresp 500 mcg daily -Continue daily allergy pill -Stop flonase and if you notice that your sinus symptoms return, restart   Continue Guaifenesin [phone] mg Twice daily for chest congestion Complete prednisone 2 tabs daily then stay on 1 tab daily until seen back   Start  singulair 1 tab At bedtime   I will discuss next steps with Dr. Francine Graven and let you know if he thinks that an injectable medicine for your asthma/COPD is worth trying   Follow up with Dr. Francine Graven 11/8. If symptoms do not improve or worsen, please contact office for sooner follow up or seek emergency care.   Allergic asthma ? See above  Allergic  rhinitis Compensated on current regimen  Former smoker Stable CT chest without any suspicious masses/nodules. No acute process. Lung RADS 1. Continue with lung cancer screening program.    I spent 42 minutes of dedicated to the care of this patient on the date of this encounter to include pre-visit review of records, face-to-face time with the patient discussing conditions above, post visit ordering of testing, clinical documentation with the electronic health record, making appropriate referrals as documented, and communicating necessary findings to members of the patients care team.  Noemi Chapel, NP 07/28/2023  Pt aware and understands NP's role.

## 2023-07-30 ENCOUNTER — Telehealth: Payer: Self-pay | Admitting: Student

## 2023-07-30 MED ORDER — PREDNISONE 10 MG PO TABS: 10.0000 mg | ORAL_TABLET | Freq: Every day | ORAL | 0 refills | Status: DC

## 2023-07-30 NOTE — Telephone Encounter (Signed)
[facility] on [address] and [address]  PT states he is running out of Pred. Ms. Allison Quarry wanted him to stay on it until he saw the Dr. At the sched appt he has. TY.   Sending back [facility] because he only has a few pills left

## 2023-07-30 NOTE — Telephone Encounter (Signed)
Pred has been sent, called pt no answer lvmm

## 2023-07-31 ENCOUNTER — Encounter: Payer: Self-pay | Admitting: Family

## 2023-07-31 ENCOUNTER — Ambulatory Visit (INDEPENDENT_AMBULATORY_CARE_PROVIDER_SITE_OTHER): Payer: Medicare Other | Admitting: Family

## 2023-07-31 VITALS — BP 121/76 | HR 79 | Temp 98.0°F | Ht 68.0 in | Wt 187.8 lb

## 2023-07-31 DIAGNOSIS — J441 Chronic obstructive pulmonary disease with (acute) exacerbation: Secondary | ICD-10-CM | POA: Diagnosis not present

## 2023-07-31 DIAGNOSIS — R7303 Prediabetes: Secondary | ICD-10-CM

## 2023-07-31 DIAGNOSIS — N1831 Chronic kidney disease, stage 3a: Secondary | ICD-10-CM

## 2023-07-31 DIAGNOSIS — N1832 Chronic kidney disease, stage 3b: Secondary | ICD-10-CM | POA: Insufficient documentation

## 2023-07-31 LAB — COMPREHENSIVE METABOLIC PANEL
ALT: 21 U/L (ref 0–53)
AST: 12 U/L (ref 0–37)
Albumin: 4 g/dL (ref 3.5–5.2)
Alkaline Phosphatase: 74 U/L (ref 39–117)
BUN: 26 mg/dL — ABNORMAL HIGH (ref 6–23)
CO2: 30 meq/L (ref 19–32)
Calcium: 9.4 mg/dL (ref 8.4–10.5)
Chloride: 101 meq/L (ref 96–112)
Creatinine, Ser: 1.25 mg/dL (ref 0.40–1.50)
GFR: 55.76 mL/min — ABNORMAL LOW (ref >60.00)
Glucose, Bld: 166 mg/dL — ABNORMAL HIGH (ref 70–99)
Potassium: 4.7 meq/L (ref 3.5–5.1)
Sodium: 137 meq/L (ref 135–145)
Total Bilirubin: 0.4 mg/dL (ref 0.2–1.2)
Total Protein: 6 g/dL (ref 6.0–8.3)

## 2023-07-31 MED ORDER — PREDNISONE 10 MG PO TABS: 10.0000 mg | ORAL_TABLET | Freq: Every day | ORAL | 0 refills | Status: DC

## 2023-07-31 NOTE — Progress Notes (Signed)
Patient ID: Gary Wright, male    DOB: 1946-07-03, 77 y.o.   MRN: [id number]  Chief Complaint  Patient presents with   Borderline type 2 Diabetes mellitus   *Discussed the use of AI scribe software for clinical note transcription with the patient, who gave verbal consent to proceed.  History of Present Illness   The patient, with a 20-year history of COPD and asthma, reports a progressive worsening of his condition. He describes his COPD as severe and believes he is in the final stage of the disease. He has had two severe exacerbations in the past, which he believes have caused significant lung damage. Despite quitting smoking, his condition has not improved significantly. He is currently being followed by Pulmonology and on a tapering course of prednisone, which he believes is the only thing keeping him going. He also uses a nebulizer daily and has recently switched from Trelegy to [employer] inhaler medication. He is on his second day of the new medication and it is too early to tell if it is making a difference. He also takes guaifenesin (generic Mucinex) daily.  In addition to his respiratory issues, the patient also reports a high level of fatigue. He has a follow-up appointment with his pulmonologist in November, where he will discuss potential injectable immunologic treatment for his COPD and asthma. He also has an upcoming echocardiogram scheduled with his cardiologist.     Borderline diabetes:  A1C 6.2 03/2023.  Denies any polyphagia, polyuria or dipsia. Pt has lost a few lbs over last month. Last visit he had been drinking more lemon juice mixed with water several times per day as it has helped him lose weight.    Assessment & Plan:     COPD/Asthma - Progressive worsening of symptoms with recent exacerbations. Currently on prednisone taper, Breztri inhaler, and nebulizer treatments. High eosinophil count indicating worsening asthma/allergic component. -Continue prednisone taper as  directed, RX for ongoing prednisone resent to correct pharmacy. -Continue Breztri inhaler as directed. -Continue nebulizer treatments daily. -Advised on daily hydration and eating mini meals through the day. -Discuss potential immunologic injectable treatment with pulmonologist at next visit on November 8th.  Stage 3a Kidney Disease - Stable, no current medications harmful to kidneys. -Drink at least 2 liters of water daily. -Avoid NSAIDs. -Check kidney function today. -Discuss potential initiation of Farxiga or Jardiance with cardiologist at next visit to help with kidney function.      Subjective:    Outpatient Medications Prior to Visit  Medication Sig Dispense Refill   albuterol (PROVENTIL) (2.5 MG/3ML) 0.083% nebulizer solution USE 1 VIAL VIA NEBULIZER FOUR TIMES DAILY AS NEEDED 360 mL 2   albuterol (VENTOLIN HFA) 108 (90 Base) MCG/ACT inhaler INHALE 2 PUFFS BY MOUTH EVERY 4 TO 6 HOURS AS NEEDED 20.1 g 1   aspirin 81 MG chewable tablet CHEW AND SWALLOW 1 TABLET(81 MG) BY MOUTH DAILY (Patient taking differently: Chew 81 mg by mouth daily at 12 noon.) 30 tablet 2   atorvastatin (LIPITOR) 40 MG tablet TAKE 1 TABLET(40 MG) BY MOUTH DAILY AT 6 PM 90 tablet 3   buPROPion (WELLBUTRIN XL) 300 MG 24 hr tablet Take 300 mg by mouth every morning.  1   citalopram (CELEXA) 40 MG tablet Take 40 mg by mouth every morning.  0   fluticasone (FLONASE) 50 MCG/ACT nasal spray Place 2 sprays into both nostrils daily. 18.2 mL 2   Fluticasone-Umeclidin-Vilant (TRELEGY ELLIPTA) 100-62.5-25 MCG/ACT AEPB Inhale 1 puff into the lungs daily at  6 (six) AM. 180 each 3   metoprolol succinate (TOPROL-XL) 25 MG 24 hr tablet TAKE 1/2 TABLET(12.5 MG) BY MOUTH DAILY 45 tablet 6   montelukast (SINGULAIR) 10 MG tablet Take 1 tablet (10 mg total) by mouth at bedtime. 30 tablet 11   olmesartan (BENICAR) 40 MG tablet Take 1 tablet (40 mg total) by mouth daily. 30 tablet 11   predniSONE (DELTASONE) 10 MG tablet 4 tabs for 5  days, then 3 tabs for 5 days, 2 tabs for 5 days, then 1 tab for 5 days, then stop. 50 tablet 0   roflumilast (DALIRESP) 500 MCG TABS tablet Take 1 tablet (500 mcg total) by mouth daily. 30 tablet 11   predniSONE (DELTASONE) 10 MG tablet Take 1 tablet (10 mg total) by mouth daily with breakfast. 30 tablet 0   No facility-administered medications prior to visit.   Past Medical History:  Diagnosis Date   Acute dyspnea 03/24/2022   Acute exacerbation of chronic obstructive pulmonary disease (COPD) (HCC) 03/25/2022   CAD (coronary artery disease) of artery bypass graft 06/13/2016   Chest pain    COPD (chronic obstructive pulmonary disease) (HCC)    Depression 09/01/2007   Qualifier: Diagnosis of  By: Genelle Gather CMA, [place]     Elevated troponin 06/13/2016   History of lung cancer 11/02/2020   Hyperlipidemia 11/02/2020   Hypertension    Prolonged QT interval 11/02/2020   Stress-induced cardiomyopathy 06/13/2016   TOBACCO ABUSE 09/01/2007   Qualifier: Diagnosis of   By: Genelle Gather CMA, [place]         Past Surgical History:  Procedure Laterality Date   BACK SURGERY     fusion in 1973   CARDIAC CATHETERIZATION N/A 06/13/2016   Procedure: Left Heart Cath and Coronary Angiography;  Surgeon: Yvonne Kendall, MD;  Location: [facility] INVASIVE CV LAB;  Service: Cardiovascular;  Laterality: N/A;   INGUINAL HERNIA REPAIR Right 04/21/2018   Procedure: RIGHT INGUINAL HERNIA REPAIR ERAS PATHWAY;  Surgeon: Harriette Bouillon, MD;  Location: [facility] OR;  Service: General;  Laterality: Right;  TAP BLOCK   INSERTION OF MESH Right 04/21/2018   Procedure: INSERTION OF MESH;  Surgeon: Harriette Bouillon, MD;  Location: [facility] OR;  Service: General;  Laterality: Right;   TONSILLECTOMY     No Known Allergies    Objective:    Physical Exam Vitals and nursing note reviewed.  Constitutional:      General: He is not in acute distress.    Appearance: Normal appearance.  HENT:     Head: Normocephalic.  Cardiovascular:     Rate and  Rhythm: Normal rate and regular rhythm.  Pulmonary:     Effort: Pulmonary effort is normal.     Breath sounds: Examination of the right-upper field reveals wheezing. Examination of the left-upper field reveals wheezing. Examination of the right-middle field reveals wheezing. Examination of the left-middle field reveals wheezing. Wheezing present.  Musculoskeletal:        General: Normal range of motion.     Cervical back: Normal range of motion.  Skin:    General: Skin is warm and dry.  Neurological:     Mental Status: He is alert and oriented to person, place, and time.  Psychiatric:        Mood and Affect: Mood normal.    BP 121/76 (BP Location: Left Arm, Patient Position: Sitting, Cuff Size: Normal)   Pulse 79   Temp 98 F (36.7 C) (Temporal)   Ht 5\' 8"  (1.727 m)   Wt  187 lb 12.8 oz (85.2 kg)   SpO2 96%   BMI 28.55 kg/m  Wt Readings from Last 3 Encounters:  07/31/23 187 lb 12.8 oz (85.2 kg)  07/28/23 186 lb 6.4 oz (84.6 kg)  07/17/23 180 lb (81.6 kg)      Dulce Sellar, NP

## 2023-07-31 NOTE — Patient Instructions (Addendum)
It was very nice to see you today!   I will review your lab results via MyChart or we will call you in a few days. Keep your appointment with Dr Laren Boom in Nov. When you see your Cardiology provider again, ask about starting Farxiga or Jardiance to help your kidney function. Remember to drink at least 8 cups = 64oz = 2L of water every day! Avoid Advil, goody powders, Aleve over the counter medications.  Schedule with me again in 6 months.      PLEASE NOTE:  If you had any lab tests please let us know if you have not heard back within a few days. You may see your results on MyChart before we have a chance to review them but we will give you a call once they are reviewed by [place]. If we ordered any referrals today, please let us know if you have not heard from their office within the next week.

## 2023-07-31 NOTE — Assessment & Plan Note (Signed)
Stable  elevated glucose levels w/pulmonology labs A1C 01/2023 6.1 continue to advise on low carb/low sugar diet, increased water intake, drinking lemon water to lose weight, advised to reduce to 1 glass qd increase exercise where able will continue to monitor f/u 6 mos

## 2023-08-04 ENCOUNTER — Ambulatory Visit (HOSPITAL_COMMUNITY): Payer: Medicare Other | Attending: Internal Medicine

## 2023-08-04 DIAGNOSIS — I10 Essential (primary) hypertension: Secondary | ICD-10-CM | POA: Insufficient documentation

## 2023-08-04 DIAGNOSIS — I2510 Atherosclerotic heart disease of native coronary artery without angina pectoris: Secondary | ICD-10-CM | POA: Diagnosis present

## 2023-08-04 LAB — ECHOCARDIOGRAM COMPLETE
Area-P 1/2: 2.39 cm2
S' Lateral: 3.1 cm

## 2023-08-04 MED ORDER — PERFLUTREN LIPID MICROSPHERE
3.0000 mL | INTRAVENOUS | Status: AC | PRN
Start: 2023-08-04 — End: 2023-08-04
  Administered 2023-08-04: 3 mL via INTRAVENOUS

## 2023-08-06 ENCOUNTER — Telehealth: Payer: Self-pay | Admitting: Pulmonary Disease

## 2023-08-06 NOTE — Telephone Encounter (Signed)
Pt needs refill for Prednisone due to his appt being rescheduled enough to last him until Dec 4th

## 2023-08-07 ENCOUNTER — Encounter: Payer: Self-pay | Admitting: *Deleted

## 2023-08-08 NOTE — Telephone Encounter (Signed)
Rx for prednisone was sent to pharmacy by PCP 10/10. Attempted to call pt to further discus this but unable to reach. Left pt a detailed message and stated to pt if he still needed to have Rx sent in (if there were issues trying to get the one from PCP) to give the office a return call.

## 2023-08-17 ENCOUNTER — Other Ambulatory Visit: Payer: Self-pay | Admitting: Cardiology

## 2023-08-17 DIAGNOSIS — E7800 Pure hypercholesterolemia, unspecified: Secondary | ICD-10-CM

## 2023-08-20 ENCOUNTER — Telehealth: Payer: Self-pay | Admitting: Pulmonary Disease

## 2023-08-20 DIAGNOSIS — J441 Chronic obstructive pulmonary disease with (acute) exacerbation: Secondary | ICD-10-CM

## 2023-08-20 NOTE — Telephone Encounter (Signed)
Patient would like a refill of prednisone. Predestine seems to help him when he has tightness in the chest and wheezing which is what he is currently experiencing.

## 2023-08-21 ENCOUNTER — Telehealth: Payer: Self-pay | Admitting: Pharmacist

## 2023-08-21 MED ORDER — PREDNISONE 10 MG PO TABS
10.0000 mg | ORAL_TABLET | Freq: Every day | ORAL | 2 refills | Status: DC
Start: 2023-08-21 — End: 2023-09-24

## 2023-08-21 NOTE — Telephone Encounter (Signed)
Pt is wanting to increase his doses, states he is using his neb and breztri inhaler.pt is currently on 10mg  daily. Lov 07/28/23

## 2023-08-21 NOTE — Telephone Encounter (Signed)
Patient with steroid-dependent asthma. Received phone message from Dr. Francine Graven that he'd like to start patient on Dupixent if possible  Submitted a Prior Authorization request to [facility] for DUPIXENT via CoverMyMeds. Will update once we receive a response.  Key: [id number]   Chesley Mires, PharmD, MPH, BCPS, CPP Clinical Pharmacist (Rheumatology and Pulmonology)

## 2023-08-21 NOTE — Telephone Encounter (Signed)
We'll start Dupixent benefits investigation

## 2023-08-21 NOTE — Telephone Encounter (Signed)
Pt was contacted yesterday by CMA. VM was left. Dupixent enrollment form is up front for him to complete.

## 2023-08-21 NOTE — Telephone Encounter (Signed)
Patient is to continue on prednisone 10mg  daily.   Please schedule patient with Devki to start dupixent therapy for his asthma-COPD overlap syndrome. This medication will help his breathing and reduce his need for steroids/prednisone.  Thanks, JD

## 2023-08-22 ENCOUNTER — Telehealth: Payer: Self-pay | Admitting: Pulmonary Disease

## 2023-08-22 NOTE — Telephone Encounter (Signed)
Lm x1 for patient.  

## 2023-08-22 NOTE — Telephone Encounter (Signed)
Rc'd signed form and put in Katie's box. PT signed in two places. Refused copy.

## 2023-08-22 NOTE — Telephone Encounter (Signed)
disregard

## 2023-08-22 NOTE — Telephone Encounter (Signed)
See [name]'s notes below  PT presented to the front desk saying he spoke to a nurse yesterday about a Pred refill. He also wanted to increase the dose. Taking 1 tab a day and it does not seem to be working.   States it gets  to a point where he fears he has to go to the hospital so he has been taking 4 for a few days and that gave him relife. That is why he needs a refill.  Pharm is [facility] on [address] # is [phone] if you have quest.   The nurse was going to contact Dr. Francine Graven

## 2023-08-25 ENCOUNTER — Telehealth: Payer: Self-pay | Admitting: Pulmonary Disease

## 2023-08-25 NOTE — Telephone Encounter (Signed)
Patient needs refill of prednisone.  Pharmacy [facility] on [address]

## 2023-08-25 NOTE — Telephone Encounter (Signed)
I called the pt and there was no answer- LMTCB and closing per protocol.

## 2023-08-26 ENCOUNTER — Other Ambulatory Visit: Payer: Self-pay | Admitting: Cardiology

## 2023-08-28 ENCOUNTER — Other Ambulatory Visit (HOSPITAL_COMMUNITY): Payer: Self-pay

## 2023-08-28 ENCOUNTER — Ambulatory Visit: Payer: Medicare Other | Admitting: Nurse Practitioner

## 2023-08-28 NOTE — Telephone Encounter (Signed)
Received notification from [facility] regarding a prior authorization for DUPIXENT. Authorization has been APPROVED from 08/21/2023 to 02/18/2024. Approval letter sent to scan center.  Per test claim, copay for 28 days supply is $955.75  Authorization # [id number] Phone # [phone]  Submitted Patient Assistance Application to [employer] for DUPIXENT along with provider portion, patient portion, PA, medication list, insurance card copy. Will update patient when we receive a response.  Phone #: [phone] Fax #: [phone]  Chesley Mires, PharmD, MPH, BCPS, CPP Clinical Pharmacist (Rheumatology and Pulmonology)

## 2023-08-29 ENCOUNTER — Ambulatory Visit: Payer: Medicare Other | Admitting: Pulmonary Disease

## 2023-09-04 NOTE — Telephone Encounter (Signed)
Lm for patient.   Prednisone sent to pharmacy 08/21/2023

## 2023-09-06 ENCOUNTER — Other Ambulatory Visit: Payer: Self-pay | Admitting: Pulmonary Disease

## 2023-09-10 ENCOUNTER — Telehealth: Payer: Self-pay | Admitting: Pulmonary Disease

## 2023-09-10 NOTE — Telephone Encounter (Signed)
PT states he needs more Neb solution (Albuterol) and [facility] said no reply from [place]. Please transmit.  [facility] on [address] and [address]

## 2023-09-16 NOTE — Telephone Encounter (Signed)
Refill sent.

## 2023-09-24 ENCOUNTER — Encounter: Payer: Self-pay | Admitting: Pulmonary Disease

## 2023-09-24 ENCOUNTER — Ambulatory Visit: Payer: Medicare Other | Admitting: Pulmonary Disease

## 2023-09-24 VITALS — BP 132/70 | HR 91 | Temp 97.9°F | Ht 68.0 in | Wt 192.8 lb

## 2023-09-24 DIAGNOSIS — J4489 Other specified chronic obstructive pulmonary disease: Secondary | ICD-10-CM

## 2023-09-24 MED ORDER — TRELEGY ELLIPTA 200-62.5-25 MCG/ACT IN AEPB
1.0000 | INHALATION_SPRAY | Freq: Every day | RESPIRATORY_TRACT | 11 refills | Status: DC
Start: 2023-09-24 — End: 2023-12-22

## 2023-09-24 MED ORDER — PREDNISONE 10 MG PO TABS
ORAL_TABLET | ORAL | 0 refills | Status: AC
Start: 2023-09-24 — End: 2023-10-05

## 2023-09-24 MED ORDER — PREDNISONE 10 MG PO TABS
15.0000 mg | ORAL_TABLET | Freq: Every day | ORAL | 2 refills | Status: DC
Start: 2023-09-24 — End: 2023-12-04

## 2023-09-24 NOTE — Progress Notes (Signed)
Synopsis: Referred in August 2023 for COPD by Rhetta Mura, PA  Subjective:   PATIENT ID: Gary Wright GENDER: male DOB: December 05, 1945, MRN: [id number]  HPI  Chief Complaint  Patient presents with   Follow-up    Increased sob, cough-yellow, wheezing   Gary Wright is a 77 year old male, former smoker with CAD, hypertension, hyperlipidemia and lung cancer s/p LUL lobectomy 10/30/2018 who returns to pulmonary clinic for COPD.   Initial OV 05/23/22 He was admitted 6/4 to 6/7 for COPD exacerbation. He is currently on Trelegy ellipta 1 puff daily. He is using albuterol inhaler 1- 2 times per day. He is using albuterol nebulizer treatments 3 times per day.   He has on going dyspnea. He does have some wheezing intermittently. He does have cough with some phlegm, that is light brown in color.   He has oxygen equipment at home and is not using it. He denies issues with seasonal allergies. He does have heart burn issues more so at night. He is sleeping with head of bed elevated.   He is retired. He lives with his wife. He quit smoking in 2019. He smoked 1.5 pack per day for 20 years.   OV 01/2023 He is on trelegy ellipta 1 puff daily and as needed albuterol inhaler or nebulizer treatment. He is also on roflumilast every other day. He is coughing less and having less mucous production.  Today OV 09/24/23 He was last seen by Rhunette Croft, NP 07/28/23. The symptoms have progressed to the point where the patient reports being unable to do much anymore. The patient has found some relief with a higher dose of prednisone, but the relief is only partial. The patient has increased the frequency of nebulizer treatments from twice to three times a day due to the worsening symptoms. The patient has previously been on Trelegy 200, but is currently on Trelegy 100. The patient reports no significant improvement with the use of roflumilast. The patient has a history of smoking but quit in 2019. There is no  current exposure to secondhand smoke.    Past Medical History:  Diagnosis Date   Acute dyspnea 03/24/2022   Acute exacerbation of chronic obstructive pulmonary disease (COPD) (HCC) 03/25/2022   CAD (coronary artery disease) of artery bypass graft 06/13/2016   Chest pain    COPD (chronic obstructive pulmonary disease) (HCC)    Depression 09/01/2007   Qualifier: Diagnosis of  By: Genelle Gather CMA, [place]     Elevated troponin 06/13/2016   History of lung cancer 11/02/2020   Hyperlipidemia 11/02/2020   Hypertension    Prolonged QT interval 11/02/2020   Stress-induced cardiomyopathy 06/13/2016   TOBACCO ABUSE 09/01/2007   Qualifier: Diagnosis of   By: Genelle Gather CMA, [place]           Family History  Problem Relation Age of Onset   Hypertension Mother    Hypertension Father      Social History   Socioeconomic History   Marital status: Legally Separated    Spouse name: Not on file   Number of children: Not on file   Years of education: Not on file   Highest education level: Not on file  Occupational History   Not on file  Tobacco Use   Smoking status: Former    Current packs/day: 0.00    Types: Cigarettes    Quit date: 2019    Years since quitting: 5.9   Smokeless tobacco: Never  Vaping Use   Vaping status: Never  Used  Substance and Sexual Activity   Alcohol use: No    Comment: No alcohol for 17 years   Drug use: No   Sexual activity: Not on file  Other Topics Concern   Not on file  Social History Narrative   Not on file   Social Determinants of Health   Financial Resource Strain: Not on file  Food Insecurity: Not on file  Transportation Needs: Not on file  Physical Activity: Not on file  Stress: Not on file  Social Connections: Not on file  Intimate Partner Violence: Not on file     No Known Allergies   Outpatient Medications Prior to Visit  Medication Sig Dispense Refill   albuterol (PROVENTIL) (2.5 MG/3ML) 0.083% nebulizer solution USE 1 VIAL VIA  NEBULIZER FOUR TIMES DAILY AS NEEDED 360 mL 2   albuterol (VENTOLIN HFA) 108 (90 Base) MCG/ACT inhaler INHALE 2 PUFFS BY MOUTH EVERY 4 TO 6 HOURS AS NEEDED 20.1 g 1   aspirin 81 MG chewable tablet CHEW AND SWALLOW 1 TABLET(81 MG) BY MOUTH DAILY (Patient taking differently: Chew 81 mg by mouth daily at 12 noon.) 30 tablet 2   atorvastatin (LIPITOR) 40 MG tablet TAKE 1 TABLET(40 MG) BY MOUTH DAILY AT 6 PM 90 tablet 3   buPROPion (WELLBUTRIN XL) 300 MG 24 hr tablet Take 300 mg by mouth every morning.  1   citalopram (CELEXA) 40 MG tablet Take 40 mg by mouth every morning.  0   metoprolol succinate (TOPROL-XL) 25 MG 24 hr tablet TAKE 1/2 TABLET(12.5 MG) BY MOUTH DAILY 45 tablet 6   olmesartan (BENICAR) 40 MG tablet Take 1 tablet (40 mg total) by mouth daily. 30 tablet 11   roflumilast (DALIRESP) 500 MCG TABS tablet Take 1 tablet (500 mcg total) by mouth daily. 30 tablet 11   Fluticasone-Umeclidin-Vilant (TRELEGY ELLIPTA) 100-62.5-25 MCG/ACT AEPB Inhale 1 puff into the lungs daily at 6 (six) AM. 180 each 3   predniSONE (DELTASONE) 10 MG tablet Take 1 tablet (10 mg total) by mouth daily with breakfast. 30 tablet 2   fluticasone (FLONASE) 50 MCG/ACT nasal spray Place 2 sprays into both nostrils daily. (Patient not taking: Reported on 09/24/2023) 18.2 mL 2   montelukast (SINGULAIR) 10 MG tablet Take 1 tablet (10 mg total) by mouth at bedtime. (Patient not taking: Reported on 09/24/2023) 30 tablet 11   No facility-administered medications prior to visit.    Review of Systems  Constitutional:  Negative for chills, fever, malaise/fatigue and weight loss.  HENT:  Negative for congestion, sinus pain and sore throat.   Eyes: Negative.   Respiratory:  Positive for shortness of breath. Negative for cough, hemoptysis and sputum production.   Cardiovascular:  Negative for chest pain, palpitations, orthopnea, claudication and leg swelling.  Gastrointestinal:  Negative for abdominal pain, heartburn, nausea and  vomiting.  Genitourinary: Negative.   Musculoskeletal:  Negative for joint pain and myalgias.  Skin:  Negative for rash.  Neurological:  Negative for weakness.  Endo/Heme/Allergies: Negative.    Objective:   Vitals:   09/24/23 1143  BP: 132/70  Pulse: 91  Temp: 97.9 F (36.6 C)  TempSrc: Temporal  SpO2: 99%  Weight: 192 lb 12.8 oz (87.5 kg)  Height: 5\' 8"  (1.727 m)   Physical Exam Constitutional:      General: He is not in acute distress. HENT:     Head: Normocephalic and atraumatic.  Eyes:     Conjunctiva/sclera: Conjunctivae normal.  Cardiovascular:     Rate  and Rhythm: Normal rate and regular rhythm.     Pulses: Normal pulses.     Heart sounds: Normal heart sounds. No murmur heard. Pulmonary:     Effort: Pulmonary effort is normal.     Breath sounds: No wheezing or rhonchi.  Abdominal:     General: Bowel sounds are normal.     Palpations: Abdomen is soft.  Musculoskeletal:     Right lower leg: No edema.     Left lower leg: No edema.  Skin:    General: Skin is warm and dry.  Neurological:     General: No focal deficit present.     Mental Status: He is alert.    CBC    Component Value Date/Time   WBC 9.5 07/16/2023 1518   RBC 4.69 07/16/2023 1518   HGB 14.3 07/16/2023 1518   HCT 44.2 07/16/2023 1518   PLT 289.0 07/16/2023 1518   MCV 94.3 07/16/2023 1518   MCH 31.1 03/27/2022 0331   MCHC 32.3 07/16/2023 1518   RDW 13.9 07/16/2023 1518   LYMPHSABS 1.5 07/16/2023 1518   MONOABS 0.7 07/16/2023 1518   EOSABS 0.2 07/16/2023 1518   BASOSABS 0.0 07/16/2023 1518      Latest Ref Rng & Units 07/31/2023    2:11 PM 12/30/2022    2:13 PM 12/27/2022    2:46 PM  BMP  Glucose 70 - 99 mg/dL 578  469  68   BUN 6 - 23 mg/dL 26  22  22    Creatinine 0.40 - 1.50 mg/dL 6.29  5.28  4.13   Sodium 135 - 145 mEq/L 137  137  140   Potassium 3.5 - 5.1 mEq/L 4.7  4.5  4.9   Chloride 96 - 112 mEq/L 101  102  103   CO2 19 - 32 mEq/L 30  29  28    Calcium 8.4 - 10.5 mg/dL 9.4   9.2  9.9    Chest imaging: CXR 09/11/22 Heart size and mediastinal contours are unremarkable. Postop change from left upper lobectomy. There is no pleural effusion or edema identified. No airspace opacities identified. The visualized osseous structures are notable for bilateral glenohumeral joint osteoarthritis and thoracic spondylosis.  PFT:    Latest Ref Rng & Units 09/11/2022    9:47 AM  PFT Results  FVC-Pre L 2.96   FVC-Predicted Pre % 70   FVC-Post L 3.22   FVC-Predicted Post % 76   Pre FEV1/FVC % % 54   Post FEV1/FCV % % 57   FEV1-Pre L 1.58   FEV1-Predicted Pre % 52   FEV1-Post L 1.83   DLCO uncorrected ml/min/mmHg 16.42   DLCO UNC% % 65   DLCO corrected ml/min/mmHg 16.42   DLCO COR %Predicted % 65   DLVA Predicted % 78   TLC L 6.78   TLC % Predicted % 96   RV % Predicted % 132     Labs:  Path:  Echo 03/24/22: LV EF 40-45%. RV systolic function and size is normal.   Heart Catheterization:  Assessment & Plan:   Asthma-COPD overlap syndrome (HCC) - Plan: Fluticasone-Umeclidin-Vilant (TRELEGY ELLIPTA) 200-62.5-25 MCG/ACT AEPB, predniSONE (DELTASONE) 10 MG tablet, predniSONE (DELTASONE) 10 MG tablet  Discussion: Gary Wright is a 77 year old male, former smoker with CAD, hypertension, hyperlipidemia and lung cancer s/p LUL lobectomy 10/30/2018 who returns to pulmonary clinic for asthma-COPD.   Chronic Obstructive Pulmonary Disease (COPD) Worsening symptoms with increased chest tightness and decreased activity tolerance. Currently on Prednisone 10mg  daily,  Trelegy 100, and nebulizer treatments 3 times daily. Patient reports some relief with higher doses of Prednisone. -Increase Trelegy to 200. -Increase Prednisone to 15mg  daily. -Continue nebulizer treatments 3 times daily. -Continue Roflumilast 500mg  daily.  Potential initiation of Dupixent Discussed high cost and potential patient assistance program. Awaiting response from pharmacy team. -Continue to  pursue patient assistance for Dupixent.  Follow-up in 3-4 months or sooner if Dupixent becomes available.  Melody Comas, MD [facility] Office: [phone]   Current Outpatient Medications:    albuterol (PROVENTIL) (2.5 MG/3ML) 0.083% nebulizer solution, USE 1 VIAL VIA NEBULIZER FOUR TIMES DAILY AS NEEDED, Disp: 360 mL, Rfl: 2   albuterol (VENTOLIN HFA) 108 (90 Base) MCG/ACT inhaler, INHALE 2 PUFFS BY MOUTH EVERY 4 TO 6 HOURS AS NEEDED, Disp: 20.1 g, Rfl: 1   aspirin 81 MG chewable tablet, CHEW AND SWALLOW 1 TABLET(81 MG) BY MOUTH DAILY (Patient taking differently: Chew 81 mg by mouth daily at 12 noon.), Disp: 30 tablet, Rfl: 2   atorvastatin (LIPITOR) 40 MG tablet, TAKE 1 TABLET(40 MG) BY MOUTH DAILY AT 6 PM, Disp: 90 tablet, Rfl: 3   buPROPion (WELLBUTRIN XL) 300 MG 24 hr tablet, Take 300 mg by mouth every morning., Disp: , Rfl: 1   citalopram (CELEXA) 40 MG tablet, Take 40 mg by mouth every morning., Disp: , Rfl: 0   Fluticasone-Umeclidin-Vilant (TRELEGY ELLIPTA) 200-62.5-25 MCG/ACT AEPB, Inhale 1 puff into the lungs daily., Disp: 28 each, Rfl: 11   metoprolol succinate (TOPROL-XL) 25 MG 24 hr tablet, TAKE 1/2 TABLET(12.5 MG) BY MOUTH DAILY, Disp: 45 tablet, Rfl: 6   olmesartan (BENICAR) 40 MG tablet, Take 1 tablet (40 mg total) by mouth daily., Disp: 30 tablet, Rfl: 11   predniSONE (DELTASONE) 10 MG tablet, Take 4 tablets (40 mg total) by mouth daily with breakfast for 3 days, THEN 3 tablets (30 mg total) daily with breakfast for 3 days, THEN 2 tablets (20 mg total) daily with breakfast for 3 days, THEN 1 tablet (10 mg total) daily with breakfast for 3 days., Disp: 30 tablet, Rfl: 0   predniSONE (DELTASONE) 10 MG tablet, Take 1.5 tablets (15 mg total) by mouth daily with breakfast., Disp: 45 tablet, Rfl: 2   roflumilast (DALIRESP) 500 MCG TABS tablet, Take 1 tablet (500 mcg total) by mouth daily., Disp: 30 tablet, Rfl: 11

## 2023-09-24 NOTE — Patient Instructions (Signed)
Will increase your trelegy to 200, 1 puff daily - rinse mouth out after each use  Start prednisone taper 40mg  daily x 3 days 30mg  daily x 3 days 20mg  daily x 3 days  Then take 15mg  daily there after  I will check with our pharmacy team regarding dupixent therapy  Use nebulizer treatments or albuterol inhaler as needed  Follow up in 3-4 months

## 2023-10-08 NOTE — Telephone Encounter (Signed)
Message sent to Delle Reining for case update

## 2023-10-09 NOTE — Telephone Encounter (Signed)
Per Delle Reining. [employer] never received application despite receiving confirmation fax. Application refaxed to [employer]  Phone #: [phone] Fax #: [phone]  Chesley Mires, PharmD, MPH, BCPS, CPP Clinical Pharmacist (Rheumatology and Pulmonology)

## 2023-10-21 NOTE — Telephone Encounter (Signed)
PT calling stating he wonders if he should go up 5 mg on his pred. He is very SOB and even walking to the shower exasperates him. Please call to advise @ [phone]

## 2023-10-21 NOTE — Telephone Encounter (Signed)
 Called and spoke with patient, he states he is taking prednisone  15 mg daily, using his trelegy inhaler daily, using his nebulizer up to 3 times daily and uses his inhaler prior to exerting himself, however, he still gets sob when he exerts himself and when he bathes/showers.  He is coughing and the mucous is clear.  He is asking if he needs to go up on his prednisone .  He states a message was being sent to [facility] for recommendations.  Advised he would receive a call with recommendations.

## 2023-10-23 NOTE — Telephone Encounter (Signed)
 Pt is aware of recommendations and voiced his understanding.  He stated that he started Dupixent about a month ago.  Routing to Gary Wright as an [place].

## 2023-10-23 NOTE — Telephone Encounter (Signed)
 No, would not recommend he go up on his prednisone right now. He should use the nebs prior to activities that he knows will cause him to be short winded. Did he start the Dupixent yet?

## 2023-10-27 ENCOUNTER — Ambulatory Visit: Payer: Medicare Other | Admitting: Family

## 2023-10-27 VITALS — BP 114/77 | HR 106 | Temp 98.0°F | Ht 68.0 in | Wt 189.2 lb

## 2023-10-27 DIAGNOSIS — B369 Superficial mycosis, unspecified: Secondary | ICD-10-CM

## 2023-10-27 DIAGNOSIS — J4489 Other specified chronic obstructive pulmonary disease: Secondary | ICD-10-CM

## 2023-10-27 DIAGNOSIS — R21 Rash and other nonspecific skin eruption: Secondary | ICD-10-CM | POA: Diagnosis not present

## 2023-10-27 MED ORDER — FLUCONAZOLE 200 MG PO TABS
ORAL_TABLET | ORAL | 0 refills | Status: DC
Start: 2023-10-27 — End: 2023-11-18

## 2023-10-27 MED ORDER — CLOTRIMAZOLE-BETAMETHASONE 1-0.05 % EX CREA: 1.0000 | TOPICAL_CREAM | Freq: Two times a day (BID) | CUTANEOUS | 2 refills | Status: DC

## 2023-10-27 NOTE — Progress Notes (Signed)
 Patient ID: Gary Wright, male    DOB: Sep 29, 1946, 78 y.o.   MRN: [id number]  Chief Complaint  Patient presents with   Rash    Pt c/o left sided rash on buttocks and ankles, Present for 3 weeks. Has tried dry skin cream.        Discussed the use of AI scribe software for clinical note transcription with the patient, who gave verbal consent to proceed.  History of Present Illness   The patient, with a history of lung disease, presents with a recurrent rash on his buttocks and new uncomfortable rash on his left ankle  and heel.  He reports having the ankle rash in the past but got better when he was on prednisone  for his lungs.  The rash on his buttocks, previously treated with an antifungal cream and oral medication, has returned but is not as severe as the previous episode. The ankle discomfort, described as dry and itchy, is worse at night and has been unresponsive to over-the-counter creams. The only relief he has experienced was during a flare-up of his lung disease when he was prescribed prednisone . The patient also mentions that he has been recommended a new injection for his lung disease, which is expensive, and he is working with his pharmacy to manage the cost.     Assessment & Plan:   Fungal Infection - Recurrent rash on the buttocks, previously resolved with antifungal treatment. -Refill antifungal cream Clotrimazole -betamethasone  and Fluconazole  200mg  x 3d, with additional refills. -Apply cream twice daily to buttocks. -F/U prn.  Dermatitis - Chronic, itchy rash on left ankle and heel, previously improved with systemic steroids. -Apply same antifungal cream above to left ankle and heel twice daily. -pt advised to call back in one week if no improvement.  Chronic Obstructive Pulmonary Disease (COPD) - Stable, with recent discussion of adding immunologic injection therapy. Lungs with chronic mild rhonchi. -Continue current regimen including Daliresp , prednisone  as needed for  flare-ups, and Trelegy 200 daily. -Follow up with pulmonologist regarding potential addition of immunologic therapy.     Subjective:    Outpatient Medications Prior to Visit  Medication Sig Dispense Refill   albuterol  (PROVENTIL ) (2.5 MG/3ML) 0.083% nebulizer solution USE 1 VIAL VIA NEBULIZER FOUR TIMES DAILY AS NEEDED 360 mL 2   albuterol  (VENTOLIN  HFA) 108 (90 Base) MCG/ACT inhaler INHALE 2 PUFFS BY MOUTH EVERY 4 TO 6 HOURS AS NEEDED 20.1 g 1   aspirin  81 MG chewable tablet CHEW AND SWALLOW 1 TABLET(81 MG) BY MOUTH DAILY (Patient taking differently: Chew 81 mg by mouth daily at 12 noon.) 30 tablet 2   atorvastatin  (LIPITOR) 40 MG tablet TAKE 1 TABLET(40 MG) BY MOUTH DAILY AT 6 PM 90 tablet 3   buPROPion  (WELLBUTRIN  XL) 300 MG 24 hr tablet Take 300 mg by mouth every morning.  1   citalopram  (CELEXA ) 40 MG tablet Take 40 mg by mouth every morning.  0   Fluticasone -Umeclidin-Vilant (TRELEGY ELLIPTA ) 200-62.5-25 MCG/ACT AEPB Inhale 1 puff into the lungs daily. 28 each 11   metoprolol  succinate (TOPROL -XL) 25 MG 24 hr tablet TAKE 1/2 TABLET(12.5 MG) BY MOUTH DAILY 45 tablet 6   olmesartan  (BENICAR ) 40 MG tablet Take 1 tablet (40 mg total) by mouth daily. 30 tablet 11   predniSONE  (DELTASONE ) 10 MG tablet Take 1.5 tablets (15 mg total) by mouth daily with breakfast. 45 tablet 2   roflumilast  (DALIRESP ) 500 MCG TABS tablet Take 1 tablet (500 mcg total) by mouth daily. 30 tablet 11  No facility-administered medications prior to visit.   Past Medical History:  Diagnosis Date   Acute dyspnea 03/24/2022   Acute exacerbation of chronic obstructive pulmonary disease (COPD) (HCC) 03/25/2022   CAD (coronary artery disease) of artery bypass graft 06/13/2016   Chest pain    COPD (chronic obstructive pulmonary disease) (HCC)    Depression 09/01/2007   Qualifier: Diagnosis of  By: Nickola CMA, [place]     Elevated troponin 06/13/2016   History of lung cancer 11/02/2020   Hyperlipidemia 11/02/2020    Hypertension    Prolonged QT interval 11/02/2020   Stress-induced cardiomyopathy 06/13/2016   TOBACCO ABUSE 09/01/2007   Qualifier: Diagnosis of   By: Nickola CMA, [place]         Past Surgical History:  Procedure Laterality Date   BACK SURGERY     fusion in 1973   CARDIAC CATHETERIZATION N/A 06/13/2016   Procedure: Left Heart Cath and Coronary Angiography;  Surgeon: Lonni Hanson, MD;  Location: [facility] INVASIVE CV LAB;  Service: Cardiovascular;  Laterality: N/A;   INGUINAL HERNIA REPAIR Right 04/21/2018   Procedure: RIGHT INGUINAL HERNIA REPAIR ERAS PATHWAY;  Surgeon: Vanderbilt Ned, MD;  Location: [facility] OR;  Service: General;  Laterality: Right;  TAP BLOCK   INSERTION OF MESH Right 04/21/2018   Procedure: INSERTION OF MESH;  Surgeon: Vanderbilt Ned, MD;  Location: [facility] OR;  Service: General;  Laterality: Right;   TONSILLECTOMY     No Known Allergies    Objective:    Physical Exam Vitals and nursing note reviewed.  Constitutional:      General: He is not in acute distress.    Appearance: Normal appearance.  HENT:     Head: Normocephalic.  Cardiovascular:     Rate and Rhythm: Normal rate and regular rhythm.  Pulmonary:     Effort: Pulmonary effort is normal.     Breath sounds: Examination of the right-upper field reveals rhonchi. Examination of the left-upper field reveals rhonchi. Examination of the right-middle field reveals rhonchi. Rhonchi (mild) present.  Musculoskeletal:        General: Normal range of motion.     Cervical back: Normal range of motion.  Skin:    General: Skin is warm and dry.     Findings: Rash (left lateral ankle with scaling and a few crusty lesions, no erythema or warmth) present.  Neurological:     Mental Status: He is alert and oriented to person, place, and time.  Psychiatric:        Mood and Affect: Mood normal.    BP 114/77 (BP Location: Left Arm, Patient Position: Sitting, Cuff Size: Normal)   Pulse (!) 106   Temp 98 F (36.7 C) (Temporal)   Ht  5' 8 (1.727 m)   Wt 189 lb 4 oz (85.8 kg)   SpO2 99%   BMI 28.78 kg/m  Wt Readings from Last 3 Encounters:  10/27/23 189 lb 4 oz (85.8 kg)  09/24/23 192 lb 12.8 oz (87.5 kg)  07/31/23 187 lb 12.8 oz (85.2 kg)      Lucius Krabbe, NP

## 2023-11-03 ENCOUNTER — Emergency Department (HOSPITAL_COMMUNITY): Payer: Medicare Other

## 2023-11-03 ENCOUNTER — Other Ambulatory Visit: Payer: Self-pay

## 2023-11-03 ENCOUNTER — Emergency Department (HOSPITAL_COMMUNITY)
Admission: EM | Admit: 2023-11-03 | Discharge: 2023-11-03 | Disposition: A | Discharge status: Home / Self Care | Payer: Medicare Other | Attending: Emergency Medicine | Admitting: Emergency Medicine

## 2023-11-03 ENCOUNTER — Encounter (HOSPITAL_COMMUNITY): Payer: Self-pay | Admitting: Emergency Medicine

## 2023-11-03 DIAGNOSIS — Z20822 Contact with and (suspected) exposure to covid-19: Secondary | ICD-10-CM | POA: Insufficient documentation

## 2023-11-03 DIAGNOSIS — I129 Hypertensive chronic kidney disease with stage 1 through stage 4 chronic kidney disease, or unspecified chronic kidney disease: Secondary | ICD-10-CM | POA: Diagnosis not present

## 2023-11-03 DIAGNOSIS — I2510 Atherosclerotic heart disease of native coronary artery without angina pectoris: Secondary | ICD-10-CM | POA: Insufficient documentation

## 2023-11-03 DIAGNOSIS — N189 Chronic kidney disease, unspecified: Secondary | ICD-10-CM | POA: Diagnosis not present

## 2023-11-03 DIAGNOSIS — Z7982 Long term (current) use of aspirin: Secondary | ICD-10-CM | POA: Diagnosis not present

## 2023-11-03 DIAGNOSIS — Z7951 Long term (current) use of inhaled steroids: Secondary | ICD-10-CM | POA: Diagnosis not present

## 2023-11-03 DIAGNOSIS — J441 Chronic obstructive pulmonary disease with (acute) exacerbation: Secondary | ICD-10-CM | POA: Diagnosis not present

## 2023-11-03 DIAGNOSIS — Z79899 Other long term (current) drug therapy: Secondary | ICD-10-CM | POA: Diagnosis not present

## 2023-11-03 DIAGNOSIS — R0602 Shortness of breath: Secondary | ICD-10-CM | POA: Diagnosis present

## 2023-11-03 LAB — BASIC METABOLIC PANEL
Anion gap: 11 (ref 5–15)
BUN: 18 mg/dL (ref 8–23)
CO2: 23 mmol/L (ref 22–32)
Calcium: 9.2 mg/dL (ref 8.9–10.3)
Chloride: 98 mmol/L (ref 98–111)
Creatinine, Ser: 1.23 mg/dL (ref 0.61–1.24)
GFR, Estimated: >60 mL/min (ref >60)
Glucose, Bld: 205 mg/dL — ABNORMAL HIGH (ref 70–99)
Potassium: 3.6 mmol/L (ref 3.5–5.1)
Sodium: 132 mmol/L — ABNORMAL LOW (ref 135–145)

## 2023-11-03 LAB — CBC WITH DIFFERENTIAL/PLATELET
Abs Immature Granulocytes: 0.08 10*3/uL — ABNORMAL HIGH (ref 0.00–0.07)
Basophils Absolute: 0 10*3/uL (ref 0.0–0.1)
Basophils Relative: 0 %
Eosinophils Absolute: 0 10*3/uL (ref 0.0–0.5)
Eosinophils Relative: 0 %
HCT: 38.2 % — ABNORMAL LOW (ref 39.0–52.0)
Hemoglobin: 13 g/dL (ref 13.0–17.0)
Immature Granulocytes: 1 %
Lymphocytes Relative: 12 %
Lymphs Abs: 1.5 10*3/uL (ref 0.7–4.0)
MCH: 32.8 pg (ref 26.0–34.0)
MCHC: 34 g/dL (ref 30.0–36.0)
MCV: 96.5 fL (ref 80.0–100.0)
Monocytes Absolute: 0.6 10*3/uL (ref 0.1–1.0)
Monocytes Relative: 5 %
Neutro Abs: 10.2 10*3/uL — ABNORMAL HIGH (ref 1.7–7.7)
Neutrophils Relative %: 82 %
Platelets: 209 10*3/uL (ref 150–400)
RBC: 3.96 MIL/uL — ABNORMAL LOW (ref 4.22–5.81)
RDW: 12.6 % (ref 11.5–15.5)
WBC: 12.4 10*3/uL — ABNORMAL HIGH (ref 4.0–10.5)
nRBC: 0 % (ref 0.0–0.2)

## 2023-11-03 LAB — RESP PANEL BY RT-PCR (RSV, FLU A&B, COVID)  RVPGX2
Influenza A by PCR: NEGATIVE
Influenza B by PCR: NEGATIVE
Resp Syncytial Virus by PCR: NEGATIVE
SARS Coronavirus 2 by RT PCR: NEGATIVE

## 2023-11-03 MED ORDER — IPRATROPIUM-ALBUTEROL 0.5-2.5 (3) MG/3ML IN SOLN
3.0000 mL | Freq: Once | RESPIRATORY_TRACT | Status: AC
  Administered 2023-11-03: 3 mL via RESPIRATORY_TRACT
  Filled 2023-11-03: qty 3

## 2023-11-03 MED ORDER — AZITHROMYCIN 250 MG PO TABS: 250.0000 mg | ORAL_TABLET | Freq: Every day | ORAL | 0 refills | Status: AC

## 2023-11-03 MED ORDER — PREDNISONE 10 MG PO TABS: 40.0000 mg | ORAL_TABLET | Freq: Every day | ORAL | 0 refills | Status: DC

## 2023-11-03 MED ORDER — ACETAMINOPHEN 325 MG PO TABS
650.0000 mg | ORAL_TABLET | Freq: Once | ORAL | Status: AC
  Administered 2023-11-03: 650 mg via ORAL
  Filled 2023-11-03: qty 2

## 2023-11-03 MED ORDER — ONDANSETRON HCL 4 MG/2ML IJ SOLN
4.0000 mg | Freq: Once | INTRAMUSCULAR | Status: AC
  Administered 2023-11-03: 4 mg via INTRAVENOUS
  Filled 2023-11-03: qty 2

## 2023-11-03 MED ORDER — AZITHROMYCIN 250 MG PO TABS
500.0000 mg | ORAL_TABLET | Freq: Once | ORAL | Status: AC
  Administered 2023-11-03: 500 mg via ORAL
  Filled 2023-11-03: qty 2

## 2023-11-03 NOTE — ED Triage Notes (Signed)
 BIB EMS 3 days of "tachycardia and SHOB" Hx Asthma and COPD. @ duo nebs total 10 albuterol 1 mg Atrovent, 125 mg Solumedrol, #18 L AC. Exp wheezing, Decreased BS, 112-112/68-99% RA-14

## 2023-11-03 NOTE — ED Provider Notes (Signed)
  EMERGENCY DEPARTMENT AT [facility] Provider Note   CSN: [id number] Arrival date & time: 11/03/23  1056     History  Chief Complaint  Patient presents with   Shortness of Breath    Gary Wright is a 78 y.o. male.  Patient is a 78 year old male with a past medical history of COPD, CAD, hypertension, CKD presenting to the emergency department with shortness of breath.  Patient states he has had increasing shortness of breath over the last 2 to 3 days.  He states he had a mild productive cough.  He states that he felt warm last night but has had no measured fevers.  He states that he has had some bodyaches and feels generally weak and fatigued.  Denies any congestion or sore throat.  States that he is nauseous but denies any vomiting or diarrhea.  States that he has had a headache.  He denies any known sick contacts.  He states that he has been using his breathing treatments and inhalers at home with minimal relief.  He states that he is on a chronic daily steroid.  On EMS arrival, the patient was found to be significantly wheezy and did receive Solu-Medrol , albuterol  and Atrovent  and route. He denies any prior intubations or bipap use.   Shortness of Breath      Home Medications Prior to Admission medications   Medication Sig Start Date End Date Taking? Authorizing Provider  azithromycin  (ZITHROMAX ) 250 MG tablet Take 1 tablet (250 mg total) by mouth daily for 4 days. Starting tomorrow, take 1 every day until finished. 11/03/23 11/07/23 Yes Kingsley, Rogene Meth K, DO  predniSONE  (DELTASONE ) 10 MG tablet Take 4 tablets (40 mg total) by mouth daily. 11/03/23  Yes Ellouise, Alyah Boehning K, DO  albuterol  (PROVENTIL ) (2.5 MG/3ML) 0.083% nebulizer solution USE 1 VIAL VIA NEBULIZER FOUR TIMES DAILY AS NEEDED 09/12/23   [name] Dorn NOVAK, MD  albuterol  (VENTOLIN  HFA) 108 (90 Base) MCG/ACT inhaler INHALE 2 PUFFS BY MOUTH EVERY 4 TO 6 HOURS AS NEEDED 03/03/23   [name] Dorn NOVAK,  MD  aspirin  81 MG chewable tablet CHEW AND SWALLOW 1 TABLET(81 MG) BY MOUTH DAILY Patient taking differently: Chew 81 mg by mouth daily at 12 noon. 07/22/16   Pietro Redell RAMAN, MD  atorvastatin  (LIPITOR) 40 MG tablet TAKE 1 TABLET(40 MG) BY MOUTH DAILY AT 6 PM 08/18/23   Pietro Redell RAMAN, MD  buPROPion  (WELLBUTRIN  XL) 300 MG 24 hr tablet Take 300 mg by mouth every morning. 05/11/16   [provider]  citalopram  (CELEXA ) 40 MG tablet Take 40 mg by mouth every morning. 06/01/16   [provider]  clotrimazole -betamethasone  (LOTRISONE ) cream Apply 1 Application topically 2 (two) times daily. Apply to buttocks and left ankle & heel. 10/27/23   Lucius Krabbe, NP  fluconazole  (DIFLUCAN ) 200 MG tablet Take 1 pill daily with breakfast. 10/27/23   Lucius Krabbe, NP  Fluticasone -Umeclidin-Vilant (TRELEGY ELLIPTA ) 200-62.5-25 MCG/ACT AEPB Inhale 1 puff into the lungs daily. 09/24/23   [name] Dorn NOVAK, MD  metoprolol  succinate (TOPROL -XL) 25 MG 24 hr tablet TAKE 1/2 TABLET(12.5 MG) BY MOUTH DAILY 01/20/23   Pietro Redell RAMAN, MD  olmesartan  (BENICAR ) 40 MG tablet Take 1 tablet (40 mg total) by mouth daily. 06/02/23 06/01/24  Darlean Ozell NOVAK, MD  predniSONE  (DELTASONE ) 10 MG tablet Take 1.5 tablets (15 mg total) by mouth daily with breakfast. 09/24/23   [name] Dorn NOVAK, MD  roflumilast  (DALIRESP ) 500 MCG TABS tablet Take 1 tablet (500  mcg total) by mouth daily. 12/27/22   Malachy Comer GAILS, NP      Allergies    Patient has no known allergies.    Review of Systems   Review of Systems  Respiratory:  Positive for shortness of breath.     Physical Exam Updated Vital Signs BP 107/61   Pulse (!) 118   Temp 99.8 F (37.7 C) (Oral)   Resp 20   Ht 5' 8 (1.727 m)   Wt 85 kg   SpO2 94%   BMI 28.49 kg/m  Physical Exam Vitals and nursing note reviewed.  Constitutional:      General: He is not in acute distress.    Appearance: He is well-developed. He is obese.  HENT:     Head:  Normocephalic and atraumatic.     Mouth/Throat:     Mouth: Mucous membranes are moist.     Pharynx: Oropharynx is clear.  Eyes:     Extraocular Movements: Extraocular movements intact.  Cardiovascular:     Rate and Rhythm: Regular rhythm. Tachycardia present.     Heart sounds: Normal heart sounds.  Pulmonary:     Effort: Pulmonary effort is normal.     Breath sounds: Wheezing (Trace end expiratory wheeze with prolonged expiratory phase) present.  Abdominal:     Palpations: Abdomen is soft.     Tenderness: There is no abdominal tenderness.  Musculoskeletal:        General: Normal range of motion.     Cervical back: Normal range of motion and neck supple.     Right lower leg: No edema.     Left lower leg: No edema.  Skin:    General: Skin is warm and dry.  Neurological:     General: No focal deficit present.     Mental Status: He is alert and oriented to person, place, and time.  Psychiatric:        Mood and Affect: Mood normal.        Behavior: Behavior normal.     ED Results / Procedures / Treatments   Labs (all labs ordered are listed, but only abnormal results are displayed) Labs Reviewed  BASIC METABOLIC PANEL - Abnormal; Notable for the following components:      Result Value   Sodium 132 (*)    Glucose, Bld 205 (*)    All other components within normal limits  CBC WITH DIFFERENTIAL/PLATELET - Abnormal; Notable for the following components:   WBC 12.4 (*)    RBC 3.96 (*)    HCT 38.2 (*)    Neutro Abs 10.2 (*)    Abs Immature Granulocytes 0.08 (*)    All other components within normal limits  RESP PANEL BY RT-PCR (RSV, FLU A&B, COVID)  [id number]    EKG EKG Interpretation Date/Time:  Monday November 03 2023 11:26:14 EST Ventricular Rate:  119 PR Interval:  134 QRS Duration:  134 QT Interval:  345 QTC Calculation: 486 R Axis:   -56  Text Interpretation: Sinus tachycardia RBBB and LAFB ST elevation, consider inferior injury No significant change since last  tracing Confirmed by Ellouise Fine ([id number]) on 11/03/2023 11:30:47 AM  Radiology DG Chest Port 1 View Result Date: 11/03/2023 CLINICAL DATA:  78 year old male with shortness of breath. EXAM: PORTABLE CHEST 1 VIEW COMPARISON:  Screening chest CT 07/04/2023 and earlier. FINDINGS: Portable AP semi upright view at 1135 hours. Stable to slightly lower lung volumes. Mediastinal contours remain normal. Emphysema confirmed on prior CT, when allowing for  portable technique the lungs are clear. No pneumothorax or pleural effusion. Stable visualized osseous structures. Paucity of bowel gas. IMPRESSION: Emphysema (ICD10-J43.9). No acute cardiopulmonary abnormality. Electronically Signed   By: VEAR Hurst M.D.   On: 11/03/2023 12:29    Procedures Procedures    Medications Ordered in ED Medications  azithromycin  (ZITHROMAX ) tablet 500 mg (has no administration in time range)  ipratropium-albuterol  (DUONEB) 0.5-2.5 (3) MG/3ML nebulizer solution 3 mL (3 mLs Nebulization Given 11/03/23 1142)  ondansetron  (ZOFRAN ) injection 4 mg (4 mg Intravenous Given 11/03/23 1142)  acetaminophen  (TYLENOL ) tablet 650 mg (650 mg Oral Given 11/03/23 1142)    ED Course/ Medical Decision Making/ A&P Clinical Course as of 11/03/23 1321  Mon Nov 03, 2023  1236 Mild hyperglycemia, likely related to steroid use. No acute disease on CXR, viral swab negative. Will be treated with azithro for COPD exacerbation. [VK]  1318 Upon reassessment, the patient reports significant improvement of his symptoms, lungs now sound clear to auscultation.  Patient is still mildly tachycardic with a heart rate in the 110s, likely related to the albuterol  he has received today.  He is otherwise stable for discharge home and is recommended close primary care or pulmonary follow-up and was given strict return precautions. [VK]    Clinical Course User Index [VK] Kingsley, [place] Spainhower K, DO                                 Medical Decision Making This patient  presents to the ED with chief complaint(s) of shortness of breath with pertinent past medical history of COPD, CAD, hypertension, CKD which further complicates the presenting complaint. The complaint involves an extensive differential diagnosis and also carries with it a high risk of complications and morbidity.    The differential diagnosis includes patient has had wheezing on exam concerning for COPD exacerbation, bronchitis, pneumonia, pneumothorax, pulmonary edema, pleural effusion, ACS, arrhythmia, anemia, viral syndrome  Additional history obtained: Additional history obtained from EMS  Records reviewed outpatient pulmonary records  ED Course and Reassessment: On patient's arrival he is tachycardic otherwise hemodynamically stable in no acute distress, satting well on room air.  He does still have trace end expiratory wheeze with tight breath sounds and prolonged expiratory phase.  He does report some improvement of symptoms after nebs and steroids by EMS.  Will be given additional DuoNeb here.  Will have labs including viral swab and chest x-ray to evaluate for cause of his COPD exacerbation and will be closely reassessed.  Independent labs interpretation:  The following labs were independently interpreted: mild leukocytosis and hyperglycemia otheriwse within normal range  Independent visualization of imaging: - I independently visualized the following imaging with scope of interpretation limited to determining acute life threatening conditions related to emergency care: CXR, which revealed no acute disease  Consultation: - Consulted or discussed management/test interpretation w/ external professional: N/A  Consideration for admission or further workup: Patient has no emergent conditions requiring admission or further work-up at this time and is stable for discharge home with primary care follow-up  Social Determinants of health: N/A    Amount and/or Complexity of Data  Reviewed Labs: ordered. Radiology: ordered.  Risk OTC drugs. Prescription drug management.          Final Clinical Impression(s) / ED Diagnoses Final diagnoses:  COPD exacerbation (HCC)    Rx / DC Orders ED Discharge Orders  Ordered    azithromycin  (ZITHROMAX ) 250 MG tablet  Daily        11/03/23 1319    predniSONE  (DELTASONE ) 10 MG tablet  Daily        11/03/23 1319              Kingsley, Juelz Claar K, DO 11/03/23 1321

## 2023-11-03 NOTE — Discharge Instructions (Signed)
 You were seen in the emergency department for your shortness of breath.  You had wheezing consistent with a COPD exacerbation.  You received steroids and nebulizer treatments with improvement of your symptoms.  He had no pneumonia on your x-ray and tested negative for COVID, flu and RSV.  We have given you a prescription of antibiotics as well as a course of 40 mg of steroids that you should take for the next 4 days and then you can resume to your normal prednisone  dose.  You can continue to use your rescue inhalers and nebulizer as needed.  You should follow-up with your primary doctor or your pulmonologist in the next few days to have your symptoms rechecked.  You should return to the emergency department for significantly worsening shortness of breath, fevers despite the antibiotics, severe chest pain or any other new or concerning symptoms.

## 2023-11-04 ENCOUNTER — Encounter: Payer: Self-pay | Admitting: Family

## 2023-11-04 DIAGNOSIS — B369 Superficial mycosis, unspecified: Secondary | ICD-10-CM | POA: Insufficient documentation

## 2023-11-04 DIAGNOSIS — J4489 Other specified chronic obstructive pulmonary disease: Secondary | ICD-10-CM | POA: Insufficient documentation

## 2023-11-04 NOTE — Assessment & Plan Note (Signed)
 Followed by pulmonology. Stable, with recent discussion of adding immunologic injection therapy. Lungs with chronic mild rhonchi. -Continue current regimen including Daliresp , prednisone  as needed for flare-ups, and Trelegy 200 daily. -Follow up with pulmonologist regarding potential addition of immunologic therapy.

## 2023-11-06 ENCOUNTER — Telehealth: Payer: Self-pay | Admitting: Pulmonary Disease

## 2023-11-06 ENCOUNTER — Ambulatory Visit (INDEPENDENT_AMBULATORY_CARE_PROVIDER_SITE_OTHER): Payer: Medicare Other | Admitting: Family

## 2023-11-06 ENCOUNTER — Other Ambulatory Visit (HOSPITAL_COMMUNITY): Payer: Self-pay

## 2023-11-06 ENCOUNTER — Encounter: Payer: Self-pay | Admitting: Family

## 2023-11-06 ENCOUNTER — Telehealth: Payer: Self-pay

## 2023-11-06 VITALS — BP 111/72 | HR 72 | Temp 98.0°F | Ht 68.0 in | Wt 187.1 lb

## 2023-11-06 DIAGNOSIS — E871 Hypo-osmolality and hyponatremia: Secondary | ICD-10-CM | POA: Diagnosis not present

## 2023-11-06 DIAGNOSIS — G4701 Insomnia due to medical condition: Secondary | ICD-10-CM

## 2023-11-06 DIAGNOSIS — J441 Chronic obstructive pulmonary disease with (acute) exacerbation: Secondary | ICD-10-CM

## 2023-11-06 LAB — BASIC METABOLIC PANEL
BUN: 24 mg/dL — ABNORMAL HIGH (ref 6–23)
CO2: 28 meq/L (ref 19–32)
Calcium: 8.9 mg/dL (ref 8.4–10.5)
Chloride: 100 meq/L (ref 96–112)
Creatinine, Ser: 1.34 mg/dL (ref 0.40–1.50)
GFR: 51.2 mL/min — ABNORMAL LOW (ref >60.00)
Glucose, Bld: 235 mg/dL — ABNORMAL HIGH (ref 70–99)
Potassium: 4.4 meq/L (ref 3.5–5.1)
Sodium: 137 meq/L (ref 135–145)

## 2023-11-06 LAB — CBC WITH DIFFERENTIAL/PLATELET
Basophils Absolute: 0 10*3/uL (ref 0.0–0.1)
Basophils Relative: 0.1 % (ref 0.0–3.0)
Eosinophils Absolute: 0 10*3/uL (ref 0.0–0.7)
Eosinophils Relative: 0 % (ref 0.0–5.0)
HCT: 39.9 % (ref 39.0–52.0)
Hemoglobin: 13.3 g/dL (ref 13.0–17.0)
Lymphocytes Relative: 9.1 % — ABNORMAL LOW (ref 12.0–46.0)
Lymphs Abs: 0.8 10*3/uL (ref 0.7–4.0)
MCHC: 33.3 g/dL (ref 30.0–36.0)
MCV: 96.6 fL (ref 78.0–100.0)
Monocytes Absolute: 0.2 10*3/uL (ref 0.1–1.0)
Monocytes Relative: 2.7 % — ABNORMAL LOW (ref 3.0–12.0)
Neutro Abs: 7.5 10*3/uL (ref 1.4–7.7)
Neutrophils Relative %: 88.1 % — ABNORMAL HIGH (ref 43.0–77.0)
Platelets: 293 10*3/uL (ref 150.0–400.0)
RBC: 4.13 Mil/uL — ABNORMAL LOW (ref 4.22–5.81)
RDW: 13.7 % (ref 11.5–15.5)
WBC: 8.5 10*3/uL (ref 4.0–10.5)

## 2023-11-06 MED ORDER — BELSOMRA 5 MG PO TABS
5.0000 mg | ORAL_TABLET | Freq: Every evening | ORAL | 0 refills | Status: DC | PRN
Start: 2023-11-06 — End: 2023-11-07

## 2023-11-06 NOTE — Progress Notes (Signed)
Patient ID: Gary Wright, male    DOB: [birth year], 78 y.o.   MRN: [id number]  Chief Complaint  Patient presents with   Follow-up    Pt was seen in ED on 11/03/2023 for COPD. SX included SOB and productive cough. Pt was given Azithromycin 250 mg Oral Daily and PredniSONE 15 mg.        Discussed the use of AI scribe software for clinical note transcription with the patient, who gave verbal consent to proceed.  History of Present Illness   The patient, with a history of chronic respiratory disease, presents after a recent ER visit for a respiratory infection. He reports that the antibiotics and prednisone prescribed at the ER have improved his symptoms, and he is no longer experiencing difficulty breathing. He has an oximeter at home and reads 97-98% most of the time. He is also on a nebulizer medication, which he believes is just albuterol. He uses this two to three times daily and reports that it helps him, he has little to no mucus.  He reports the antifungal tx sent in after his last visit have been improving his 2 rashes. In addition, the patient reports struggling with insomnia. He typically does not fall asleep until six in the morning and wakes up around ten, resulting in only a few hours of sleep each night. He expresses a desire to address this issue, as it has been ongoing for some time.     Assessment & Plan:     COPD w/exacerbation - Recent ER visit due to difficulty breathing. Improved with antibiotics and prednisone. Elevated blood count indicating infection and hyponatremia. Lungs clear on auscultation. -Complete current course of antibiotics and prednisone dosing. -Recheck CBC & BMP today to ensure numbers are improving. -Follow up with Pulmonology, advising of recent ER visit.  Insomnia - Chronic insomnia, difficulty falling asleep, resulting in inadequate sleep. -Prescribe Belsomra 5mg  starting with one pill for a few nights, then increase to two pills if needed, advised  on SE. -Monitor for effectiveness and side effects. Call office if not helping.     Subjective:    Outpatient Medications Prior to Visit  Medication Sig Dispense Refill   albuterol (PROVENTIL) (2.5 MG/3ML) 0.083% nebulizer solution USE 1 VIAL VIA NEBULIZER FOUR TIMES DAILY AS NEEDED 360 mL 2   albuterol (VENTOLIN HFA) 108 (90 Base) MCG/ACT inhaler INHALE 2 PUFFS BY MOUTH EVERY 4 TO 6 HOURS AS NEEDED 20.1 g 1   aspirin 81 MG chewable tablet CHEW AND SWALLOW 1 TABLET(81 MG) BY MOUTH DAILY (Patient taking differently: Chew 81 mg by mouth daily at 12 noon.) 30 tablet 2   atorvastatin (LIPITOR) 40 MG tablet TAKE 1 TABLET(40 MG) BY MOUTH DAILY AT 6 PM 90 tablet 3   azithromycin (ZITHROMAX) 250 MG tablet Take 1 tablet (250 mg total) by mouth daily for 4 days. Starting tomorrow, take 1 every day until finished. 4 tablet 0   buPROPion (WELLBUTRIN XL) 300 MG 24 hr tablet Take 300 mg by mouth every morning.  1   citalopram (CELEXA) 40 MG tablet Take 40 mg by mouth every morning.  0   clotrimazole-betamethasone (LOTRISONE) cream Apply 1 Application topically 2 (two) times daily. Apply to buttocks and left ankle & heel. 45 g 2   fluconazole (DIFLUCAN) 200 MG tablet Take 1 pill daily with breakfast. 3 tablet 0   Fluticasone-Umeclidin-Vilant (TRELEGY ELLIPTA) 200-62.5-25 MCG/ACT AEPB Inhale 1 puff into the lungs daily. 28 each 11   metoprolol succinate (TOPROL-XL)  25 MG 24 hr tablet TAKE 1/2 TABLET(12.5 MG) BY MOUTH DAILY 45 tablet 6   olmesartan (BENICAR) 40 MG tablet Take 1 tablet (40 mg total) by mouth daily. 30 tablet 11   predniSONE (DELTASONE) 10 MG tablet Take 1.5 tablets (15 mg total) by mouth daily with breakfast. 45 tablet 2   predniSONE (DELTASONE) 10 MG tablet Take 4 tablets (40 mg total) by mouth daily. 16 tablet 0   roflumilast (DALIRESP) 500 MCG TABS tablet Take 1 tablet (500 mcg total) by mouth daily. 30 tablet 11   No facility-administered medications prior to visit.   Past Medical  History:  Diagnosis Date   Acute dyspnea 03/24/2022   Acute exacerbation of chronic obstructive pulmonary disease (COPD) (HCC) 03/25/2022   CAD (coronary artery disease) of artery bypass graft 06/13/2016   Chest pain    COPD (chronic obstructive pulmonary disease) (HCC)    COPD with acute exacerbation (HCC) 11/02/2020   Quit smokig 2019  - PFT's  121/22/23  FEV1 1.83 (60 % ) ratio 0.57  p 15 % improvement from saba p 0 prior to study with DLCO  16.4 (65%)   and FV curve mildly concave  - try off acei and on otc gerd rx 06/02/2023 >>>       Depression 09/01/2007   Qualifier: Diagnosis of  By: Genelle Gather CMA, [place]     Elevated troponin 06/13/2016   History of lung cancer 11/02/2020   Hyperlipidemia 11/02/2020   Hypertension    Prolonged QT interval 11/02/2020   Stress-induced cardiomyopathy 06/13/2016   TOBACCO ABUSE 09/01/2007   Qualifier: Diagnosis of   By: Genelle Gather CMA, [place]         Past Surgical History:  Procedure Laterality Date   BACK SURGERY     fusion in 1973   CARDIAC CATHETERIZATION N/A 06/13/2016   Procedure: Left Heart Cath and Coronary Angiography;  Surgeon: Yvonne Kendall, MD;  Location: [facility] INVASIVE CV LAB;  Service: Cardiovascular;  Laterality: N/A;   INGUINAL HERNIA REPAIR Right 04/21/2018   Procedure: RIGHT INGUINAL HERNIA REPAIR ERAS PATHWAY;  Surgeon: Harriette Bouillon, MD;  Location: [facility] OR;  Service: General;  Laterality: Right;  TAP BLOCK   INSERTION OF MESH Right 04/21/2018   Procedure: INSERTION OF MESH;  Surgeon: Harriette Bouillon, MD;  Location: [facility] OR;  Service: General;  Laterality: Right;   TONSILLECTOMY     No Known Allergies    Objective:    Physical Exam Vitals and nursing note reviewed.  Constitutional:      General: He is not in acute distress.    Appearance: Normal appearance.  HENT:     Head: Normocephalic.  Cardiovascular:     Rate and Rhythm: Normal rate and regular rhythm.  Pulmonary:     Effort: Pulmonary effort is normal.     Breath sounds:  Normal breath sounds.  Musculoskeletal:        General: Normal range of motion.     Cervical back: Normal range of motion.  Skin:    General: Skin is warm and dry.  Neurological:     Mental Status: He is alert and oriented to person, place, and time.  Psychiatric:        Mood and Affect: Mood normal.    BP 111/72 (BP Location: Left Arm, Patient Position: Sitting, Cuff Size: Normal)   Pulse 72   Temp 98 F (36.7 C) (Temporal)   Ht 5\' 8"  (1.727 m)   Wt 187 lb 2 oz (84.9 kg)  SpO2 99%   BMI 28.45 kg/m  Wt Readings from Last 3 Encounters:  11/06/23 187 lb 2 oz (84.9 kg)  11/03/23 187 lb 6.3 oz (85 kg)  10/27/23 189 lb 4 oz (85.8 kg)      Dulce Sellar, NP

## 2023-11-06 NOTE — Telephone Encounter (Signed)
-----   Message from [place] sent at 11/06/2023 10:49 AM EST ----- Regarding: Belsomra please start PA - pt has severe COPD & limited in which meds he can take due to respiratory depression. If any clarification needed or additional DX, please let me know.

## 2023-11-06 NOTE — Telephone Encounter (Signed)
Pharmacy Patient Advocate Encounter   Received notification from Physician's Office that prior authorization for [facility] is required/requested.   Insurance verification completed.   The patient is insured through  [facility]  .   Per test claim: Refill too soon. PA is not needed at this time. Medication was filled 11/06/23. Next eligible fill date is 11/29/23.

## 2023-11-06 NOTE — Telephone Encounter (Signed)
Patient went to the hospital on January 13th because he had a COPD flare up. He just wanted the doctor to know that. He was placed on steroids for 4 days and antibiotics.

## 2023-11-07 ENCOUNTER — Other Ambulatory Visit: Payer: Self-pay | Admitting: Family

## 2023-11-07 MED ORDER — RAMELTEON 8 MG PO TABS: 8.0000 mg | ORAL_TABLET | Freq: Every day | ORAL | 5 refills | Status: DC

## 2023-11-07 NOTE — Telephone Encounter (Signed)
Patient returned call. He is scheduled for Dupixent new start on 11/10/2023

## 2023-11-07 NOTE — Telephone Encounter (Signed)
Enrolled patient into asthma grant through PA[employer]: Award Period: 05/11/2023 - 11/06/2024 Cardholder: [id number] BIN: [id number] PCN: [id number] Group: [id number] For pharmacy inquiries, contact PDMI at [phone]  ATC patient to schedule Dupixent new start. Unable to reach. Left VM  Chesley Mires, PharmD, MPH, BCPS, CPP Clinical Pharmacist (Rheumatology and Pulmonology)

## 2023-11-07 NOTE — Progress Notes (Signed)
*   also sent message about his sleep med* labs ok, infection much better. All of the prednisone has shot his blood sugar up over 200 - we need to recheck this in 2 weeks - schedule a lab only, preferably fasting, for a BMP. Needs to not eat any sweets and reduce carbs especially white carbs - bread, biscuits, rice, snacks, etc. Thx

## 2023-11-07 NOTE — Telephone Encounter (Signed)
I called pt in regards to message above, pt will contact pharmacy and let us know if medication is too expensive.

## 2023-11-07 NOTE — Addendum Note (Signed)
Addended byDulce Sellar on: 11/07/2023 11:48 AM   Modules accepted: Orders

## 2023-11-07 NOTE — Telephone Encounter (Signed)
So "covered" is still over $550 - I am sending over a different med which is generic, he needs to let us know if still too expensive, though!

## 2023-11-09 NOTE — Telephone Encounter (Signed)
Noted, thanks!

## 2023-11-10 ENCOUNTER — Other Ambulatory Visit: Payer: Self-pay | Admitting: Pharmacist

## 2023-11-10 ENCOUNTER — Ambulatory Visit: Payer: Medicare Other | Admitting: Pharmacist

## 2023-11-10 DIAGNOSIS — J4550 Severe persistent asthma, uncomplicated: Secondary | ICD-10-CM

## 2023-11-10 DIAGNOSIS — Z7189 Other specified counseling: Secondary | ICD-10-CM

## 2023-11-10 DIAGNOSIS — J4489 Other specified chronic obstructive pulmonary disease: Secondary | ICD-10-CM

## 2023-11-10 MED ORDER — DUPIXENT 300 MG/2ML ~~LOC~~ SOAJ
300.0000 mg | SUBCUTANEOUS | 1 refills | Status: DC
  Filled 2023-11-14: qty 4, 28d supply, fill #0
  Filled 2023-12-08: qty 4, 28d supply, fill #1
  Filled 2024-01-12: qty 4, 28d supply, fill #2
  Filled 2024-01-30: qty 4, 28d supply, fill #3

## 2023-11-10 NOTE — Progress Notes (Signed)
Patient started Dupixent in clinic today for asthma-COPD overlap  Dose: 600mg  at Day 0 then 300mg  every 14 days thereafter CONTINUE Trelegy 200-62.5-25mcg (1 puff once daily), roflumilast once daily, prednisone  Tolerated injection well  Reviewed storage, administration, most common side effects  Chesley Mires, PharmD, MPH, BCPS, CPP Clinical Pharmacist (Rheumatology and Pulmonology)

## 2023-11-10 NOTE — Patient Instructions (Signed)
Your next DUPIXENT dose is due on 11/24/23, 12/08/23, and every 14 days thereafter  CONTINUE Trelegy 200-62.5-25mcg (1 puff once daily), roflumilast once daily, prednisone  Your prescription will be shipped from [facility]. Their phone number is [phone] [facility] will call to schedule shipment and confirm address. They will mail your medication to your home.  You will need to be seen by your provider in 3 to 4 months to assess how Dupixent is working for you. Keep your next appointment with Dr. Francine Graven  Stay up to date on all routine vaccines: influenza, pneumonia, COVID19, Shingles  How to manage an injection site reaction: Remember the 5 C's: COUNTER - leave on the counter at least 30 minutes but up to overnight to bring medication to room temperature. This may help prevent stinging COLD - place something cold (like an ice gel pack or cold water bottle) on the injection site just before cleansing with alcohol. This may help reduce pain CLARITIN - use Claritin (generic name is loratadine) for the first two weeks of treatment or the day of, the day before, and the day after injecting. This will help to minimize injection site reactions CORTISONE CREAM - apply if injection site is irritated and itching CALL ME - if injection site reaction is bigger than the size of your fist, looks infected, blisters, or if you develop hives

## 2023-11-10 NOTE — Progress Notes (Signed)
HPI Patient presents today to [facility] to see pharmacy team for Dupixent new start for asthma-COPD overlap.   Recent ED visit on 11/03/2023 for COPD exacerbations (prescribed 4-day prednisone course and azithromycin). Reports slight improvement but generally poor baseline asthma-COPD control.  Respiratory Medications Current regimen: Trelegy 200-62.5-25mcg (1 puff once daily), roflumilast once daily, prednisone 15mg  daily Patient reports no known adherence challenges  OBJECTIVE No Known Allergies  Outpatient Encounter Medications as of 11/10/2023  Medication Sig   Dupilumab (DUPIXENT) 300 MG/2ML SOAJ Inject 300 mg into the skin every 14 (fourteen) days.   albuterol (PROVENTIL) (2.5 MG/3ML) 0.083% nebulizer solution USE 1 VIAL VIA NEBULIZER FOUR TIMES DAILY AS NEEDED   albuterol (VENTOLIN HFA) 108 (90 Base) MCG/ACT inhaler INHALE 2 PUFFS BY MOUTH EVERY 4 TO 6 HOURS AS NEEDED   aspirin 81 MG chewable tablet CHEW AND SWALLOW 1 TABLET(81 MG) BY MOUTH DAILY (Patient taking differently: Chew 81 mg by mouth daily at 12 noon.)   atorvastatin (LIPITOR) 40 MG tablet TAKE 1 TABLET(40 MG) BY MOUTH DAILY AT 6 PM   buPROPion (WELLBUTRIN XL) 300 MG 24 hr tablet Take 300 mg by mouth every morning.   citalopram (CELEXA) 40 MG tablet Take 40 mg by mouth every morning.   clotrimazole-betamethasone (LOTRISONE) cream Apply 1 Application topically 2 (two) times daily. Apply to buttocks and left ankle & heel.   fluconazole (DIFLUCAN) 200 MG tablet Take 1 pill daily with breakfast.   Fluticasone-Umeclidin-Vilant (TRELEGY ELLIPTA) 200-62.5-25 MCG/ACT AEPB Inhale 1 puff into the lungs daily.   metoprolol succinate (TOPROL-XL) 25 MG 24 hr tablet TAKE 1/2 TABLET(12.5 MG) BY MOUTH DAILY   olmesartan (BENICAR) 40 MG tablet Take 1 tablet (40 mg total) by mouth daily.   predniSONE (DELTASONE) 10 MG tablet Take 1.5 tablets (15 mg total) by mouth daily with breakfast.   ramelteon (ROZEREM) 8 MG tablet  Take 1 tablet (8 mg total) by mouth at bedtime. Take 1/2 hour before bedtime.   roflumilast (DALIRESP) 500 MCG TABS tablet Take 1 tablet (500 mcg total) by mouth daily.   [DISCONTINUED] predniSONE (DELTASONE) 10 MG tablet Take 4 tablets (40 mg total) by mouth daily.   No facility-administered encounter medications on file as of 11/10/2023.     Immunization History  Administered Date(s) Administered   Fluad Quad(high Dose 65+) 08/14/2023   Influenza, High Dose Seasonal PF 07/21/2018   Influenza-Unspecified 07/10/2022   PFIZER(Purple Top)SARS-COV-2 Vaccination 12/31/2019, 01/24/2020   Pneumococcal Polysaccharide-23 06/14/2016   Pneumococcal-Unspecified 09/10/2022   Respiratory Syncytial Virus Vaccine,Recomb Aduvanted(Arexvy) 09/10/2022     PFTs    Latest Ref Rng & Units 09/11/2022    9:47 AM  PFT Results  FVC-Pre L 2.96   FVC-Predicted Pre % 70   FVC-Post L 3.22   FVC-Predicted Post % 76   Pre FEV1/FVC % % 54   Post FEV1/FCV % % 57   FEV1-Pre L 1.58   FEV1-Predicted Pre % 52   FEV1-Post L 1.83   DLCO uncorrected ml/min/mmHg 16.42   DLCO UNC% % 65   DLCO corrected ml/min/mmHg 16.42   DLCO COR %Predicted % 65   DLVA Predicted % 78   TLC L 6.78   TLC % Predicted % 96   RV % Predicted % 132     Eosinophils Most recent blood eosinophil count was <50 cells/microL taken on 11/06/2023 but was 200 on 07/16/2023 when initial benefits were started.   IgE: 510 on 07/16/2023  Assessment   Biologics training for  dupilumab (Dupixent)  Goals of therapy: Mechanism: human monoclonal IgG4 antibody that inhibits interleukin-4 and interleukin-13 cytokine-induced responses, including release of proinflammatory cytokines, chemokines, and IgE Reviewed that Dupixent is add-on medication and patient must continue maintenance inhaler regimen. Response to therapy: may take 4 months to determine efficacy. Discussed that patients generally feel improvement sooner than 4 months. Reviewed FDA  approval for both COPD and asthma  Side effects: injection site reaction (6-18%), antibody development (5-16%), ophthalmic conjunctivitis (2-16%), transient blood eosinophilia (1-2%)  Dose: 600mg  at Week 0 (administered today in clinic) followed by 300mg  every 14 days thereafter  Administration/Storage:  Reviewed administration sites of thigh or abdomen (at least 2-3 inches away from abdomen). Reviewed the upper arm is only appropriate if caregiver is administering injection  Do not shake pen/syringe as this could lead to product foaming or precipitation. Do not use if solution is discolored or contains particulate matter or if window on prefilled pen is yellow (indicates pen has been used).  Reviewed storage of medication in refrigerator. Reviewed that Dupixent can be stored at room temperature in unopened carton for up to 14 days.  Access: Approval of Dupixent through: insurance and grant Patient enrolled into [employer] grant to help with copay assistance.  Patient self-administered Dupixent 300mg /8ml x 2 (total dose 600mg ) in right lower abdomen and left lower abdomen using sample Dupixent 300mg /101mL autoinjector pen NDC: [phone] Lot: [id number] Expiration: 05/20/2025  Patient monitored for 30 minutes for adverse reaction.  Patient tolerated well.  Injection site noted. Patient denies itchiness and irritation at injection., No swelling or redness noted., and Reviewed injection site reaction management with patient verbally and printed information for review in AVS  Medication Reconciliation  A drug regimen assessment was performed, including review of allergies, interactions, disease-state management, dosing and immunization history. Medications were reviewed with the patient, including name, instructions, indication, goals of therapy, potential side effects, importance of adherence, and safe use.  Drug interaction(s): none noted  PLAN Continue Dupixent 300mg   every 14 days.  Next dose is due 11/24/2023 and every 14 days thereafter. Rx sent to: [facility]: [phone] .  Patient provided with pharmacy phone number and advised that [facility] will call to schedule shipment to home.  Continue maintenance regimen of: Trelegy 200-62.5-25mcg (1 puff once daily), roflumilast once daily, prednisone 15mg  daily  All questions encouraged and answered.  Instructed patient to reach out with any further questions or concerns.  Thank you for allowing pharmacy to participate in this patient's care.  This appointment required 45 minutes of patient care (this includes precharting, chart review, review of results, face-to-face care, etc.).

## 2023-11-12 ENCOUNTER — Other Ambulatory Visit (HOSPITAL_COMMUNITY): Payer: Self-pay

## 2023-11-14 ENCOUNTER — Other Ambulatory Visit: Payer: Self-pay

## 2023-11-14 ENCOUNTER — Other Ambulatory Visit (HOSPITAL_COMMUNITY): Payer: Self-pay

## 2023-11-14 NOTE — Progress Notes (Signed)
Specialty Pharmacy Initial Fill Coordination Note  Gary Wright is a 78 y.o. male contacted today regarding initial fill of specialty medication(s) Dupilumab (Dupixent)   Patient requested Delivery   Delivery date: 11/18/23   Verified address: [address]   [place] [address]   Medication will be filled on 11/17/23.   Patient has grant on file and is aware of $0 copayment.   Pt prefers not to use MyChart, please mail him a Conservation officer, historic buildings.

## 2023-11-17 ENCOUNTER — Other Ambulatory Visit: Payer: Self-pay

## 2023-11-18 ENCOUNTER — Other Ambulatory Visit (INDEPENDENT_AMBULATORY_CARE_PROVIDER_SITE_OTHER): Payer: Medicare Other

## 2023-11-18 ENCOUNTER — Other Ambulatory Visit: Payer: Self-pay

## 2023-11-18 ENCOUNTER — Ambulatory Visit (INDEPENDENT_AMBULATORY_CARE_PROVIDER_SITE_OTHER): Payer: Medicare Other

## 2023-11-18 ENCOUNTER — Other Ambulatory Visit: Payer: Medicare Other

## 2023-11-18 VITALS — BP 128/62 | HR 98 | Temp 97.4°F | Ht 68.0 in | Wt 184.2 lb

## 2023-11-18 DIAGNOSIS — I10 Essential (primary) hypertension: Secondary | ICD-10-CM

## 2023-11-18 DIAGNOSIS — E871 Hypo-osmolality and hyponatremia: Secondary | ICD-10-CM

## 2023-11-18 DIAGNOSIS — Z0000 Encounter for general adult medical examination without abnormal findings: Secondary | ICD-10-CM | POA: Diagnosis not present

## 2023-11-18 LAB — BASIC METABOLIC PANEL WITH GFR
BUN: 22 mg/dL (ref 6–23)
CO2: 30 meq/L (ref 19–32)
Calcium: 9.3 mg/dL (ref 8.4–10.5)
Chloride: 101 meq/L (ref 96–112)
Creatinine, Ser: 1.4 mg/dL (ref 0.40–1.50)
GFR: 48.57 mL/min — ABNORMAL LOW
Glucose, Bld: 197 mg/dL — ABNORMAL HIGH (ref 70–99)
Potassium: 4.2 meq/L (ref 3.5–5.1)
Sodium: 138 meq/L (ref 135–145)

## 2023-11-18 NOTE — Patient Instructions (Signed)
Mr. Gary Wright , Thank you for taking time to come for your Medicare Wellness Visit. I appreciate your ongoing commitment to your health goals. Please review the following plan we discussed and let me know if I can assist you in the future.   Referrals/Orders/Follow-Ups/Clinician Recommendations: Continue to exercise  Aim for 30 minutes of exercise or brisk walking, 6-8 glasses of water, and 5 servings of fruits and vegetables each day.   This is a list of the screening recommended for you and due dates:  Health Maintenance  Topic Date Due   Hepatitis C Screening  Never done   DTaP/Tdap/Td vaccine (1 - Tdap) Never done   Pneumonia Vaccine (2 of 2 - PCV) 09/11/2023   Medicare Annual Wellness Visit  11/17/2024   Flu Shot  Completed   HPV Vaccine  Aged Out   COVID-19 Vaccine  Discontinued   Zoster (Shingles) Vaccine  Discontinued    Advanced directives: (Declined) Advance directive discussed with you today. Even though you declined this today, please call our office should you change your mind, and we can give you the proper paperwork for you to fill out.  Next Medicare Annual Wellness Visit scheduled for next year: Yes

## 2023-11-18 NOTE — Progress Notes (Signed)
Subjective:   Gary Wright is a 78 y.o. male who presents for an Initial Medicare Annual Wellness Visit.  Visit Complete: In person   Cardiac Risk Factors include: advanced age (>48men, >10 women);male gender;dyslipidemia;hypertension     Objective:    Today's Vitals   11/18/23 1041  BP: 128/62  Pulse: 98  Temp: (!) 97.4 F (36.3 C)  SpO2: 99%  Weight: 184 lb 3.2 oz (83.6 kg)  Height: 5\' 8"  (1.727 m)   Body mass index is 28.01 kg/m.     11/18/2023   10:56 AM 03/24/2022    4:00 PM 03/24/2022   11:20 AM 11/02/2020    3:00 PM 04/14/2018    2:11 PM 06/13/2016   12:30 PM 06/13/2016    9:40 AM  Advanced Directives  Does Patient Have a Medical Advance Directive? No No No No No No No  Would patient like information on creating a medical advance directive? No - Patient declined No - Patient declined No - Patient declined No - Patient declined No - Patient declined No - patient declined information No - patient declined information    Current Medications (verified) Outpatient Encounter Medications as of 11/18/2023  Medication Sig   albuterol (PROVENTIL) (2.5 MG/3ML) 0.083% nebulizer solution USE 1 VIAL VIA NEBULIZER FOUR TIMES DAILY AS NEEDED   albuterol (VENTOLIN HFA) 108 (90 Base) MCG/ACT inhaler INHALE 2 PUFFS BY MOUTH EVERY 4 TO 6 HOURS AS NEEDED   aspirin 81 MG chewable tablet CHEW AND SWALLOW 1 TABLET(81 MG) BY MOUTH DAILY (Patient taking differently: Chew 81 mg by mouth daily at 12 noon.)   atorvastatin (LIPITOR) 40 MG tablet TAKE 1 TABLET(40 MG) BY MOUTH DAILY AT 6 PM   buPROPion (WELLBUTRIN XL) 300 MG 24 hr tablet Take 300 mg by mouth every morning.   citalopram (CELEXA) 40 MG tablet Take 40 mg by mouth every morning.   Dupilumab (DUPIXENT) 300 MG/2ML SOAJ Inject 300 mg into the skin every 14 (fourteen) days.   Fluticasone-Umeclidin-Vilant (TRELEGY ELLIPTA) 200-62.5-25 MCG/ACT AEPB Inhale 1 puff into the lungs daily.   metoprolol succinate (TOPROL-XL) 25 MG 24 hr tablet  TAKE 1/2 TABLET(12.5 MG) BY MOUTH DAILY   olmesartan (BENICAR) 40 MG tablet Take 1 tablet (40 mg total) by mouth daily.   predniSONE (DELTASONE) 10 MG tablet Take 1.5 tablets (15 mg total) by mouth daily with breakfast.   roflumilast (DALIRESP) 500 MCG TABS tablet Take 1 tablet (500 mcg total) by mouth daily.   ramelteon (ROZEREM) 8 MG tablet Take 1 tablet (8 mg total) by mouth at bedtime. Take 1/2 hour before bedtime. (Patient not taking: Reported on 11/18/2023)   [DISCONTINUED] clotrimazole-betamethasone (LOTRISONE) cream Apply 1 Application topically 2 (two) times daily. Apply to buttocks and left ankle & heel.   [DISCONTINUED] fluconazole (DIFLUCAN) 200 MG tablet Take 1 pill daily with breakfast.   No facility-administered encounter medications on file as of 11/18/2023.    Allergies (verified) Patient has no known allergies.   History: Past Medical History:  Diagnosis Date   Acute dyspnea 03/24/2022   Acute exacerbation of chronic obstructive pulmonary disease (COPD) (HCC) 03/25/2022   CAD (coronary artery disease) of artery bypass graft 06/13/2016   Chest pain    COPD (chronic obstructive pulmonary disease) (HCC)    COPD with acute exacerbation (HCC) 11/02/2020   Quit smokig 2019  - PFT's  121/22/23  FEV1 1.83 (60 % ) ratio 0.57  p 15 % improvement from saba p 0 prior to study with DLCO  16.4 (65%)   and FV curve mildly concave  - try off acei and on otc gerd rx 06/02/2023 >>>       Depression 09/01/2007   Qualifier: Diagnosis of  By: Genelle Gather CMA, [place]     Elevated troponin 06/13/2016   History of lung cancer 11/02/2020   Hyperlipidemia 11/02/2020   Hypertension    Prolonged QT interval 11/02/2020   Stress-induced cardiomyopathy 06/13/2016   TOBACCO ABUSE 09/01/2007   Qualifier: Diagnosis of   By: Genelle Gather CMA, [place]         Past Surgical History:  Procedure Laterality Date   BACK SURGERY     fusion in 1973   CARDIAC CATHETERIZATION N/A 06/13/2016   Procedure: Left Heart  Cath and Coronary Angiography;  Surgeon: Yvonne Kendall, MD;  Location: [facility] INVASIVE CV LAB;  Service: Cardiovascular;  Laterality: N/A;   INGUINAL HERNIA REPAIR Right 04/21/2018   Procedure: RIGHT INGUINAL HERNIA REPAIR ERAS PATHWAY;  Surgeon: Harriette Bouillon, MD;  Location: [facility] OR;  Service: General;  Laterality: Right;  TAP BLOCK   INSERTION OF MESH Right 04/21/2018   Procedure: INSERTION OF MESH;  Surgeon: Harriette Bouillon, MD;  Location: [facility] OR;  Service: General;  Laterality: Right;   TONSILLECTOMY     Family History  Problem Relation Age of Onset   Hypertension Mother    Hypertension Father    Social History   Socioeconomic History   Marital status: Married    Spouse name: Marisue Humble   Number of children: 2   Years of education: Not on file   Highest education level: Not on file  Occupational History   Occupation: retired    Associate Professor: [employer]  Tobacco Use   Smoking status: Former    Current packs/day: 0.00    Types: Cigarettes    Quit date: 2019    Years since quitting: 6.0   Smokeless tobacco: Never  Vaping Use   Vaping status: Never Used  Substance and Sexual Activity   Alcohol use: No    Comment: No alcohol for 17 years   Drug use: No   Sexual activity: Not on file  Other Topics Concern   Not on file  Social History Narrative   Married for 27 years with 3 children   Social Drivers of Corporate investment banker Strain: Low Risk  (11/18/2023)   Overall Financial Resource Strain (CARDIA)    Difficulty of Paying Living Expenses: Not hard at all  Food Insecurity: No Food Insecurity (11/18/2023)   Hunger Vital Sign    Worried About Running Out of Food in the Last Year: Never true    Ran Out of Food in the Last Year: Never true  Transportation Needs: No Transportation Needs (11/18/2023)   PRAPARE - Administrator, Civil Service (Medical): No    Lack of Transportation (Non-Medical): No  Physical Activity: Insufficiently Active (11/18/2023)   Exercise  Vital Sign    Days of Exercise per Week: 3 days    Minutes of Exercise per Session: 10 min  Stress: No Stress Concern Present (11/18/2023)   [employer] of Occupational Health - Occupational Stress Questionnaire    Feeling of Stress : Not at all  Social Connections: Moderately Isolated (11/18/2023)   Social Connection and Isolation Panel [NHANES]    Frequency of Communication with Friends and Family: Three times a week    Frequency of Social Gatherings with Friends and Family: Never    Attends Religious Services: Never    Active  Member of Clubs or Organizations: No    Attends Engineer, structural: Never    Marital Status: Married    Tobacco Counseling Counseling given: Not Answered   Clinical Intake:  Pre-visit preparation completed: Yes  Pain : No/denies pain     BMI - recorded: 28.01 Nutritional Status: BMI 25 -29 Overweight Diabetes: No  How often do you need to have someone help you when you read instructions, pamphlets, or other written materials from your doctor or pharmacy?: 1 - Never  Interpreter Needed?: No  Information entered by :: Hassell Halim, CMA   Activities of Daily Living    11/18/2023   10:50 AM  In your present state of health, do you have any difficulty performing the following activities:  Hearing? 0  Vision? 1  Comment wears eyeglasses  Difficulty concentrating or making decisions? 0  Walking or climbing stairs? 1  Comment exp. shortness of breath with climbing  Dressing or bathing? 0  Doing errands, shopping? 0  Preparing Food and eating ? N  Using the Toilet? N  In the past six months, have you accidently leaked urine? N  Do you have problems with loss of bowel control? N  Managing your Medications? N  Managing your Finances? N  Housekeeping or managing your Housekeeping? N    Patient Care Team: Dulce Sellar, NP as PCP - General (Family Medicine) Jens Som Madolyn Frieze, MD as PCP - Cardiology (Cardiology)  Indicate  any recent Medical Services you may have received from other than [facility] providers in the past year (date may be approximate).     Assessment:   This is a routine wellness examination for Chrissie Noa.  Hearing/Vision screen Hearing Screening - Comments:: No hearing concerns Vision Screening - Comments:: Wears eyeglasses and follows up with Opth Provider periodically - [facility]   Goals Addressed               This Visit's Progress     Patient Stated (pt-stated)        Patient wants to stay active as he can daily.         Depression Screen    11/18/2023   10:57 AM 01/29/2023    1:24 PM  PHQ 2/9 Scores  PHQ - 2 Score 2 1  PHQ- 9 Score 5 5    Fall Risk    11/18/2023   11:01 AM 09/24/2023   11:37 AM 01/29/2023    1:25 PM  Fall Risk   Falls in the past year? 0 0 0  Number falls in past yr: 0  0  Injury with Fall? 0  0  Risk for fall due to : No Fall Risks  Impaired mobility  Follow up Falls prevention discussed      MEDICARE RISK AT HOME: Medicare Risk at Home Any stairs in or around the home?: No If so, are there any without handrails?: No Home free of loose throw rugs in walkways, pet beds, electrical cords, etc?: Yes Adequate lighting in your home to reduce risk of falls?: Yes Life alert?: No Use of a cane, walker or w/c?: Yes (cane) Grab bars in the bathroom?: No Shower chair or bench in shower?: Yes Elevated toilet seat or a handicapped toilet?: No  TIMED UP AND GO:  Was the test performed? Yes  Length of time to ambulate 10 feet: 15 sec  Gait slow and steady with assisted device  Cognitive Function:        11/18/2023  11:02 AM  6CIT Screen  What Year? 0 points  What month? 0 points  What time? 0 points  Count back from 20 0 points  Months in reverse 2 points  Repeat phrase 0 points  Total Score 2 points    Immunizations Immunization History  Administered Date(s) Administered   Fluad Quad(high Dose 65+) 08/14/2023   Influenza, High  Dose Seasonal PF 07/21/2018   Influenza-Unspecified 07/10/2022   PFIZER(Purple Top)SARS-COV-2 Vaccination 12/31/2019, 01/24/2020   Pneumococcal Polysaccharide-23 06/14/2016   Pneumococcal-Unspecified 09/10/2022   Respiratory Syncytial Virus Vaccine,Recomb Aduvanted(Arexvy) 09/10/2022    TDAP status: Due, Education has been provided regarding the importance of this vaccine. Advised may receive this vaccine at local pharmacy or Health Dept. Aware to provide a copy of the vaccination record if obtained from local pharmacy or Health Dept. Verbalized acceptance and understanding.  Flu Vaccine status: Up to date  Pneumococcal vaccine status: Due, Education has been provided regarding the importance of this vaccine. Advised may receive this vaccine at local pharmacy or Health Dept. Aware to provide a copy of the vaccination record if obtained from local pharmacy or Health Dept. Verbalized acceptance and understanding.  Covid-19 vaccine status: Declined, Education has been provided regarding the importance of this vaccine but patient still declined. Advised may receive this vaccine at local pharmacy or Health Dept.or vaccine clinic. Aware to provide a copy of the vaccination record if obtained from local pharmacy or Health Dept. Verbalized acceptance and understanding.  Qualifies for Shingles Vaccine? Yes   Zostavax completed No   Shingrix Completed?: No.    Education has been provided regarding the importance of this vaccine. Patient has been advised to call insurance company to determine out of pocket expense if they have not yet received this vaccine. Advised may also receive vaccine at local pharmacy or Health Dept. Verbalized acceptance and understanding.  Screening Tests Health Maintenance  Topic Date Due   Hepatitis C Screening  Never done   DTaP/Tdap/Td (1 - Tdap) Never done   Pneumonia Vaccine 55+ Years old (2 of 2 - PCV) 09/11/2023   Medicare Annual Wellness (AWV)  11/17/2024   INFLUENZA  VACCINE  Completed   HPV VACCINES  Aged Out   COVID-19 Vaccine  Discontinued   Zoster Vaccines- Shingrix  Discontinued    Health Maintenance  Health Maintenance Due  Topic Date Due   Hepatitis C Screening  Never done   DTaP/Tdap/Td (1 - Tdap) Never done   Pneumonia Vaccine 68+ Years old (2 of 2 - PCV) 09/11/2023    Colorectal cancer screening: No longer required.    Additional Screening:  Hepatitis C Screening: does qualify can discuss with provider   Vision Screening: Recommended annual ophthalmology exams for early detection of glaucoma and other disorders of the eye. Is the patient up to date with their annual eye exam?  Yes  Who is the provider or what is the name of the office in which the patient attends annual eye exams? [facility] If pt is not established with a provider, would they like to be referred to a provider to establish care? No .   Dental Screening: Recommended annual dental exams for proper oral hygiene   Community Resource Referral / Chronic Care Management: CRR required this visit?  No   CCM required this visit?  No    Plan:     I have personally reviewed and noted the following in the patient's chart:   Medical and social history Use of alcohol,  tobacco or illicit drugs  Current medications and supplements including opioid prescriptions. Patient is not currently taking opioid prescriptions. Functional ability and status Nutritional status Physical activity Advanced directives List of other physicians Hospitalizations, surgeries, and ER visits in previous 12 months Vitals Screenings to include cognitive, depression, and falls Referrals and appointments  In addition, I have reviewed and discussed with patient certain preventive protocols, quality metrics, and best practice recommendations. A written personalized care plan for preventive services as well as general preventive health recommendations were provided to patient.      Marzella Schlein, LPN   06/18/5620   After Visit Summary: (In Person-Declined) Patient declined AVS at this time.  Nurse Notes: Pt will schedule an appt to f/u with Provider to discuss insomnia and medication provided.

## 2023-11-18 NOTE — Telephone Encounter (Signed)
Lab orders placed today for BMP per last lab result note.

## 2023-11-22 ENCOUNTER — Encounter: Payer: Self-pay | Admitting: Family

## 2023-11-22 NOTE — Progress Notes (Signed)
see msg below, please schedule Willem for an office visit, thx.

## 2023-11-26 ENCOUNTER — Encounter: Payer: Self-pay | Admitting: Family

## 2023-11-26 ENCOUNTER — Ambulatory Visit: Payer: Medicare Other | Admitting: Family

## 2023-11-26 VITALS — BP 107/65 | HR 73 | Temp 97.7°F | Ht 68.0 in | Wt 185.2 lb

## 2023-11-26 DIAGNOSIS — J4489 Other specified chronic obstructive pulmonary disease: Secondary | ICD-10-CM | POA: Diagnosis not present

## 2023-11-26 DIAGNOSIS — G4701 Insomnia due to medical condition: Secondary | ICD-10-CM | POA: Diagnosis not present

## 2023-11-26 DIAGNOSIS — E099 Drug or chemical induced diabetes mellitus without complications: Secondary | ICD-10-CM | POA: Diagnosis not present

## 2023-11-26 LAB — POCT GLYCOSYLATED HEMOGLOBIN (HGB A1C): Hemoglobin A1C: 7.7 % — AB (ref 4.0–5.6)

## 2023-11-26 MED ORDER — ZOLPIDEM TARTRATE 5 MG PO TABS: 5.0000 mg | ORAL_TABLET | Freq: Every evening | ORAL | 1 refills | Status: DC | PRN

## 2023-11-26 MED ORDER — GLIPIZIDE ER 2.5 MG PO TB24: 2.5000 mg | ORAL_TABLET | Freq: Every day | ORAL | 2 refills | Status: DC

## 2023-11-26 NOTE — Assessment & Plan Note (Signed)
 A1c 7.7 today, likely secondary to chronic prednisone  use for chronic asthma. No prior history of diabetes. Advised pt on harms of continuous high BS on all his organs and the need to lower it even if temporarily. Started Dupixent  about 2 weeks ago and hoping he will no longer need the prednisone  after it kicks in fully in about 2 mos. -Advised to not stop prednisone  until directed, usually will be tapered. -Starting Glipizide , low dose, 2.5mg  in the morning, advised on use & SE. -Pt advised to call the office with any concerns or possible SE. -Recheck A1c in 2 months.

## 2023-11-26 NOTE — Assessment & Plan Note (Signed)
Current medication (Rozerem) ineffective. -Discontinue Rozerem. -Start low dose Zolpidem (generic Ambien), to be taken every other night. -Report any side effects or concerns. -Follow-up in 2 months, or sooner if any issues arise.

## 2023-11-26 NOTE — Assessment & Plan Note (Signed)
 Chronic prednisone  use (15mg  daily), recently started Dupixent  injections every 2 weeks. Plan to taper off prednisone  once Dupixent  is fully in the system. -Continue Dupixent  injections every 2 weeks. -Continue prednisone  15mg  daily until further instructions from pulmonologist.

## 2023-11-26 NOTE — Progress Notes (Signed)
 Patient ID: Gary Wright, male    DOB: 09/20/1946, 78 y.o.   MRN: [id number]  Chief Complaint  Patient presents with   Diabetes       Discussed the use of AI scribe software for clinical note transcription with the patient, who gave verbal consent to proceed.  History of Present Illness   Baltasar Twilley is a 78 year old male with chronic asthma who presents with concerns about elevated blood sugar levels. He is experiencing elevated blood sugar levels, which is believed to be due to the prednisone  he is taking for asthma management. His A1c is 7.7, indicating type 2 diabetes, although he has never been diagnosed with diabetes or prediabetes prior to starting prednisone . He is currently on 15 mg of prednisone  daily, down from higher doses for the last month. He wants to discontinue prednisone  once his new asthma medication, Dupixent , is fully effective. He started Dupixent  injections two weeks ago, with a dosing schedule of one injection every two weeks. He reports no side effects from Dupixent  so far, except for a sting during the first self-administered injection, which he attributes to not allowing the medication to reach room temperature. He is hopeful that Dupixent  will allow him to taper off prednisone , which he believes will help normalize his blood sugar levels. He is experiencing sleep disturbances and has been taking Rozerem  nightly but finds it ineffective, as he did not sleep at all last night and most nights he sleeps for only a few hours. He has not tried Ambien  or trazodone before. He is concerned about potential dependency on sleep medications but is open to trying a low dose of Ambien  to improve his sleep quality.       Assessment & Plan:     Drug induced Type 2 Diabetes - New, unstable - A1c 7.7 today, likely secondary to chronic prednisone  use for chronic asthma. No prior history of diabetes. Advised pt on harms of continuous high BS on all his organs and the need to lower it  even if temporarily. Started Dupixent  about 2 weeks ago and hoping he will no longer need the prednisone  after it kicks in fully in about 2 mos. -Advised to not stop prednisone  until directed, usually will be tapered. -Starting Glipizide , low dose, 2.5mg  in the morning, advised on use & SE. -Pt advised to call the office with any concerns or possible SE. -Recheck A1c in 2 months.  Insomnia - Unstable -Current medication (Rozerem ) ineffective. -Discontinue Rozerem . -Start low dose Zolpidem  (generic Ambien ), to be taken every other night. -Report any side effects or concerns. -Follow-up in 2 months, or sooner if any issues arise.  Asthma - Chronic prednisone  use (15mg  daily), recently started Dupixent  injections every 2 weeks. Plan to taper off prednisone  once Dupixent  is fully in the system. -Continue Dupixent  injections every 2 weeks. -Continue prednisone  15mg  daily until further instructions from pulmonologist.     Subjective:    Outpatient Medications Prior to Visit  Medication Sig Dispense Refill   albuterol  (PROVENTIL ) (2.5 MG/3ML) 0.083% nebulizer solution USE 1 VIAL VIA NEBULIZER FOUR TIMES DAILY AS NEEDED 360 mL 2   albuterol  (VENTOLIN  HFA) 108 (90 Base) MCG/ACT inhaler INHALE 2 PUFFS BY MOUTH EVERY 4 TO 6 HOURS AS NEEDED 20.1 g 1   aspirin  81 MG chewable tablet CHEW AND SWALLOW 1 TABLET(81 MG) BY MOUTH DAILY (Patient taking differently: Chew 81 mg by mouth daily at 12 noon.) 30 tablet 2   atorvastatin  (LIPITOR) 40 MG tablet TAKE  1 TABLET(40 MG) BY MOUTH DAILY AT 6 PM 90 tablet 3   buPROPion  (WELLBUTRIN  XL) 300 MG 24 hr tablet Take 300 mg by mouth every morning.  1   citalopram  (CELEXA ) 40 MG tablet Take 40 mg by mouth every morning.  0   Dupilumab  (DUPIXENT ) 300 MG/2ML SOAJ Inject 300 mg into the skin every 14 (fourteen) days. 12 mL 1   Fluticasone -Umeclidin-Vilant (TRELEGY ELLIPTA ) 200-62.5-25 MCG/ACT AEPB Inhale 1 puff into the lungs daily. 28 each 11   metoprolol  succinate  (TOPROL -XL) 25 MG 24 hr tablet TAKE 1/2 TABLET(12.5 MG) BY MOUTH DAILY 45 tablet 6   olmesartan  (BENICAR ) 40 MG tablet Take 1 tablet (40 mg total) by mouth daily. 30 tablet 11   predniSONE  (DELTASONE ) 10 MG tablet Take 1.5 tablets (15 mg total) by mouth daily with breakfast. 45 tablet 2   roflumilast  (DALIRESP ) 500 MCG TABS tablet Take 1 tablet (500 mcg total) by mouth daily. 30 tablet 11   ramelteon  (ROZEREM ) 8 MG tablet Take 1 tablet (8 mg total) by mouth at bedtime. Take 1/2 hour before bedtime. 30 tablet 5   No facility-administered medications prior to visit.   Past Medical History:  Diagnosis Date   Acute dyspnea 03/24/2022   Acute exacerbation of chronic obstructive pulmonary disease (COPD) (HCC) 03/25/2022   CAD (coronary artery disease) of artery bypass graft 06/13/2016   Chest pain    COPD (chronic obstructive pulmonary disease) (HCC)    COPD with acute exacerbation (HCC) 11/02/2020   Quit smokig 2019  - PFT's  121/22/23  FEV1 1.83 (60 % ) ratio 0.57  p 15 % improvement from saba p 0 prior to study with DLCO  16.4 (65%)   and FV curve mildly concave  - try off acei and on otc gerd rx 06/02/2023 >>>       Depression 09/01/2007   Qualifier: Diagnosis of  By: Nickola CMA, [place]     Elevated troponin 06/13/2016   History of lung cancer 11/02/2020   Hyperlipidemia 11/02/2020   Hypertension    Prolonged QT interval 11/02/2020   Stress-induced cardiomyopathy 06/13/2016   TOBACCO ABUSE 09/01/2007   Qualifier: Diagnosis of   By: Nickola CMA, [place]         Past Surgical History:  Procedure Laterality Date   BACK SURGERY     fusion in 1973   CARDIAC CATHETERIZATION N/A 06/13/2016   Procedure: Left Heart Cath and Coronary Angiography;  Surgeon: Lonni Hanson, MD;  Location: [facility] INVASIVE CV LAB;  Service: Cardiovascular;  Laterality: N/A;   INGUINAL HERNIA REPAIR Right 04/21/2018   Procedure: RIGHT INGUINAL HERNIA REPAIR ERAS PATHWAY;  Surgeon: Vanderbilt Ned, MD;  Location: [facility] OR;   Service: General;  Laterality: Right;  TAP BLOCK   INSERTION OF MESH Right 04/21/2018   Procedure: INSERTION OF MESH;  Surgeon: Vanderbilt Ned, MD;  Location: [facility] OR;  Service: General;  Laterality: Right;   TONSILLECTOMY     No Known Allergies    Objective:    Physical Exam Vitals and nursing note reviewed.  Constitutional:      General: He is not in acute distress.    Appearance: Normal appearance.  HENT:     Head: Normocephalic.  Cardiovascular:     Rate and Rhythm: Normal rate and regular rhythm.  Pulmonary:     Effort: Pulmonary effort is normal.     Breath sounds: Normal breath sounds.  Musculoskeletal:        General: Normal range of motion.  Cervical back: Normal range of motion.  Skin:    General: Skin is warm.  Neurological:     Mental Status: He is alert and oriented to person, place, and time.  Psychiatric:        Mood and Affect: Mood normal.    BP 107/65 (BP Location: Left Arm, Patient Position: Sitting, Cuff Size: Large)   Pulse 73   Temp 97.7 F (36.5 C) (Temporal)   Ht 5' 8 (1.727 m)   Wt 185 lb 4 oz (84 kg)   SpO2 98%   BMI 28.17 kg/m  Wt Readings from Last 3 Encounters:  11/26/23 185 lb 4 oz (84 kg)  11/18/23 184 lb 3.2 oz (83.6 kg)  11/06/23 187 lb 2 oz (84.9 kg)      Lucius Krabbe, NP

## 2023-12-02 ENCOUNTER — Telehealth: Payer: Self-pay | Admitting: Nurse Practitioner

## 2023-12-02 DIAGNOSIS — J4489 Other specified chronic obstructive pulmonary disease: Secondary | ICD-10-CM

## 2023-12-02 NOTE — Telephone Encounter (Signed)
PT needs a refill of Pred. He is on Dupixant and will take his second shot in one week. Devki told him to keep on the pred until he had finished a 2 month course.  Ilda Basset is [facility] in [place]  His # is [phone]

## 2023-12-03 ENCOUNTER — Other Ambulatory Visit: Payer: Self-pay

## 2023-12-03 NOTE — Telephone Encounter (Signed)
Patient needs refill of prednisone. Taking 15mg  a day.  Ph[facility]acy: [facility] [address]

## 2023-12-04 MED ORDER — PREDNISONE 10 MG PO TABS: 15.0000 mg | ORAL_TABLET | Freq: Every day | ORAL | 0 refills | Status: DC

## 2023-12-04 NOTE — Telephone Encounter (Signed)
Lm for patient.  Prednisone has been sent to preferred pharmacy

## 2023-12-04 NOTE — Telephone Encounter (Signed)
Informed patient Prednisone has been called into pharmacy. NFN.

## 2023-12-08 ENCOUNTER — Other Ambulatory Visit (HOSPITAL_COMMUNITY): Payer: Self-pay

## 2023-12-08 ENCOUNTER — Other Ambulatory Visit: Payer: Self-pay

## 2023-12-08 NOTE — Progress Notes (Signed)
 Specialty Pharmacy Refill Coordination Note  Gary Wright is a 78 y.o. male contacted today regarding refills of specialty medication(s) Dupilumab (Dupixent)   Patient requested Delivery   Delivery date: 12/16/23   Verified address: [address]   [place] [address]   Medication will be filled on 12/15/23.   Injection due Monday, 12/22/23.

## 2023-12-18 ENCOUNTER — Encounter: Payer: Self-pay | Admitting: Pulmonary Disease

## 2023-12-18 ENCOUNTER — Ambulatory Visit: Payer: Medicare Other | Admitting: Pulmonary Disease

## 2023-12-18 VITALS — BP 104/62 | HR 94 | Ht 68.0 in | Wt 185.6 lb

## 2023-12-18 DIAGNOSIS — J4489 Other specified chronic obstructive pulmonary disease: Secondary | ICD-10-CM

## 2023-12-18 MED ORDER — AZITHROMYCIN 250 MG PO TABS: ORAL_TABLET | ORAL | 0 refills | Status: DC

## 2023-12-18 NOTE — Progress Notes (Signed)
 Synopsis: Referred in August 2023 for COPD by Rhetta Mura, PA  Subjective:   PATIENT ID: Gary Wright GENDER: male DOB: 02/28/1946, MRN: [id number]  HPI  Chief Complaint  Patient presents with   Follow-up   Gary Wright is a 78 year old male, former smoker with CAD, hypertension, hyperlipidemia and lung cancer s/p LUL lobectomy 10/30/2018 who returns to pulmonary clinic for COPD.   Initial OV 05/23/22 He was admitted 6/4 to 6/7 for COPD exacerbation. He is currently on Trelegy ellipta 1 puff daily. He is using albuterol inhaler 1- 2 times per day. He is using albuterol nebulizer treatments 3 times per day.   He has on going dyspnea. He does have some wheezing intermittently. He does have cough with some phlegm, that is light brown in color.   He has oxygen equipment at home and is not using it. He denies issues with seasonal allergies. He does have heart burn issues more so at night. He is sleeping with head of bed elevated.   He is retired. He lives with his wife. He quit smoking in 2019. He smoked 1.5 pack per day for 20 years.   OV 01/2023 He is on trelegy ellipta 1 puff daily and as needed albuterol inhaler or nebulizer treatment. He is also on roflumilast every other day. He is coughing less and having less mucous production.  OV 09/24/23 He was last seen by Rhunette Croft, NP 07/28/23. The symptoms have progressed to the point where the patient reports being unable to do much anymore. The patient has found some relief with a higher dose of prednisone, but the relief is only partial. The patient has increased the frequency of nebulizer treatments from twice to three times a day due to the worsening symptoms. The patient has previously been on Trelegy 200, but is currently on Trelegy 100. The patient reports no significant improvement with the use of roflumilast. The patient has a history of smoking but quit in 2019. There is no current exposure to secondhand smoke.  OV  12/18/2023 He is experiencing a recent flare-up of asthma, characterized by increased use of his rescue inhaler and coughing up yellow mucus. He can usually anticipate these flare-ups as they are preceded by these symptoms. During the current episode, he did not require an overnight hospital stay but did receive a shot of medication.  He is currently experiencing increased wheezing, chest tightness, and a significant amount of coughing, with the mucus changing from clear to yellow. No side effects from his current medications.  He started Dupixent last month and is scheduled to take his third shot next week. He is currently on a daily dose of 15 mg of prednisone. He also uses a Trelegy inhaler, taking one puff daily, and utilizes an albuterol nebulizer first thing in the morning. Recently, he has had to use his rescue inhaler more frequently than usual.  Past Medical History:  Diagnosis Date   Acute dyspnea 03/24/2022   Acute exacerbation of chronic obstructive pulmonary disease (COPD) (HCC) 03/25/2022   CAD (coronary artery disease) of artery bypass graft 06/13/2016   Chest pain    COPD (chronic obstructive pulmonary disease) (HCC)    COPD with acute exacerbation (HCC) 11/02/2020   Quit smokig 2019  - PFT's  121/22/23  FEV1 1.83 (60 % ) ratio 0.57  p 15 % improvement from saba p 0 prior to study with DLCO  16.4 (65%)   and FV curve mildly concave  - try off acei  and on otc gerd rx 06/02/2023 >>>       Depression 09/01/2007   Qualifier: Diagnosis of  By: Genelle Gather CMA, [place]     Elevated troponin 06/13/2016   History of lung cancer 11/02/2020   Hyperlipidemia 11/02/2020   Hypertension    Prolonged QT interval 11/02/2020   Stress-induced cardiomyopathy 06/13/2016   TOBACCO ABUSE 09/01/2007   Qualifier: Diagnosis of   By: Genelle Gather CMA, [place]           Family History  Problem Relation Age of Onset   Hypertension Mother    Hypertension Father      Social History   Socioeconomic History    Marital status: Married    Spouse name: Marisue Humble   Number of children: 2   Years of education: Not on file   Highest education level: Not on file  Occupational History   Occupation: retired    Associate Professor: [employer]  Tobacco Use   Smoking status: Former    Current packs/day: 0.00    Types: Cigarettes    Quit date: 2019    Years since quitting: 6.1   Smokeless tobacco: Never  Vaping Use   Vaping status: Never Used  Substance and Sexual Activity   Alcohol use: No    Comment: No alcohol for 17 years   Drug use: No   Sexual activity: Not on file  Other Topics Concern   Not on file  Social History Narrative   Married for 27 years with 3 children   Social Drivers of Corporate investment banker Strain: Low Risk  (11/18/2023)   Overall Financial Resource Strain (CARDIA)    Difficulty of Paying Living Expenses: Not hard at all  Food Insecurity: No Food Insecurity (11/18/2023)   Hunger Vital Sign    Worried About Running Out of Food in the Last Year: Never true    Ran Out of Food in the Last Year: Never true  Transportation Needs: No Transportation Needs (11/18/2023)   PRAPARE - Administrator, Civil Service (Medical): No    Lack of Transportation (Non-Medical): No  Physical Activity: Insufficiently Active (11/18/2023)   Exercise Vital Sign    Days of Exercise per Week: 3 days    Minutes of Exercise per Session: 10 min  Stress: No Stress Concern Present (11/18/2023)   [employer] of Occupational Health - Occupational Stress Questionnaire    Feeling of Stress : Not at all  Social Connections: Moderately Isolated (11/18/2023)   Social Connection and Isolation Panel [NHANES]    Frequency of Communication with Friends and Family: Three times a week    Frequency of Social Gatherings with Friends and Family: Never    Attends Religious Services: Never    Database administrator or Organizations: No    Attends Banker Meetings: Never    Marital Status:  Married  Catering manager Violence: Not At Risk (11/18/2023)   Humiliation, Afraid, Rape, and Kick questionnaire    Fear of Current or Ex-Partner: No    Emotionally Abused: No    Physically Abused: No    Sexually Abused: No     No Known Allergies   Outpatient Medications Prior to Visit  Medication Sig Dispense Refill   albuterol (PROVENTIL) (2.5 MG/3ML) 0.083% nebulizer solution USE 1 VIAL VIA NEBULIZER FOUR TIMES DAILY AS NEEDED 360 mL 2   albuterol (VENTOLIN HFA) 108 (90 Base) MCG/ACT inhaler INHALE 2 PUFFS BY MOUTH EVERY 4 TO 6 HOURS AS NEEDED 20.1 g  1   aspirin 81 MG chewable tablet CHEW AND SWALLOW 1 TABLET(81 MG) BY MOUTH DAILY (Patient taking differently: Chew 81 mg by mouth daily at 12 noon.) 30 tablet 2   atorvastatin (LIPITOR) 40 MG tablet TAKE 1 TABLET(40 MG) BY MOUTH DAILY AT 6 PM 90 tablet 3   buPROPion (WELLBUTRIN XL) 300 MG 24 hr tablet Take 300 mg by mouth every morning.  1   citalopram (CELEXA) 40 MG tablet Take 40 mg by mouth every morning.  0   Dupilumab (DUPIXENT) 300 MG/2ML SOAJ Inject 300 mg into the skin every 14 (fourteen) days. 12 mL 1   Fluticasone-Umeclidin-Vilant (TRELEGY ELLIPTA) 200-62.5-25 MCG/ACT AEPB Inhale 1 puff into the lungs daily. 28 each 11   glipiZIDE (GLUCOTROL XL) 2.5 MG 24 hr tablet Take 1 tablet (2.5 mg total) by mouth daily with breakfast. 30 tablet 2   metoprolol succinate (TOPROL-XL) 25 MG 24 hr tablet TAKE 1/2 TABLET(12.5 MG) BY MOUTH DAILY 45 tablet 6   olmesartan (BENICAR) 40 MG tablet Take 1 tablet (40 mg total) by mouth daily. 30 tablet 11   predniSONE (DELTASONE) 10 MG tablet Take 1.5 tablets (15 mg total) by mouth daily with breakfast. 45 tablet 0   roflumilast (DALIRESP) 500 MCG TABS tablet Take 1 tablet (500 mcg total) by mouth daily. 30 tablet 11   zolpidem (AMBIEN) 5 MG tablet Take 1 tablet (5 mg total) by mouth at bedtime as needed for sleep. 15 tablet 1   No facility-administered medications prior to visit.    Review of Systems   Constitutional:  Negative for chills, fever, malaise/fatigue and weight loss.  HENT:  Negative for congestion, sinus pain and sore throat.   Eyes: Negative.   Respiratory:  Positive for cough, sputum production and shortness of breath. Negative for hemoptysis.   Cardiovascular:  Negative for chest pain, palpitations, orthopnea, claudication and leg swelling.  Gastrointestinal:  Negative for abdominal pain, heartburn, nausea and vomiting.  Genitourinary: Negative.   Musculoskeletal:  Negative for joint pain and myalgias.  Skin:  Negative for rash.  Neurological:  Negative for weakness.  Endo/Heme/Allergies: Negative.    Objective:   Vitals:   12/18/23 1524  BP: 104/62  Pulse: 94  SpO2: 97%  Weight: 185 lb 9.6 oz (84.2 kg)  Height: 5\' 8"  (1.727 m)   Physical Exam Constitutional:      General: He is not in acute distress. HENT:     Head: Normocephalic and atraumatic.  Eyes:     Conjunctiva/sclera: Conjunctivae normal.  Cardiovascular:     Rate and Rhythm: Normal rate and regular rhythm.     Pulses: Normal pulses.     Heart sounds: Normal heart sounds. No murmur heard. Pulmonary:     Effort: Pulmonary effort is normal.     Breath sounds: No wheezing or rhonchi.  Musculoskeletal:     Right lower leg: No edema.     Left lower leg: No edema.  Skin:    General: Skin is warm and dry.  Neurological:     General: No focal deficit present.     Mental Status: He is alert.    CBC    Component Value Date/Time   WBC 8.5 11/06/2023 1038   RBC 4.13 (L) 11/06/2023 1038   HGB 13.3 11/06/2023 1038   HCT 39.9 11/06/2023 1038   PLT 293.0 11/06/2023 1038   MCV 96.6 11/06/2023 1038   MCH 32.8 11/03/2023 1133   MCHC 33.3 11/06/2023 1038   RDW 13.7 11/06/2023  1038   LYMPHSABS 0.8 11/06/2023 1038   MONOABS 0.2 11/06/2023 1038   EOSABS 0.0 11/06/2023 1038   BASOSABS 0.0 11/06/2023 1038      Latest Ref Rng & Units 11/18/2023   11:31 AM 11/06/2023   10:38 AM 11/03/2023   11:33 AM   BMP  Glucose 70 - 99 mg/dL 098  119  147   BUN 6 - 23 mg/dL 22  24  18    Creatinine 0.40 - 1.50 mg/dL 8.29  5.62  1.30   Sodium 135 - 145 mEq/L 138  137  132   Potassium 3.5 - 5.1 mEq/L 4.2  4.4  3.6   Chloride 96 - 112 mEq/L 101  100  98   CO2 19 - 32 mEq/L 30  28  23    Calcium 8.4 - 10.5 mg/dL 9.3  8.9  9.2    Chest imaging: CT Chest LCS 2024 Mediastinum/Nodes: No pathologically enlarged mediastinal or axillary lymph nodes. Hilar regions are difficult to definitively evaluate without IV contrast. There may be distal esophageal wall thickening which can be seen with gastroesophageal reflux disease.   Lungs/Pleura: Centrilobular and paraseptal emphysema. Left upper lobectomy. Calcified left lower lobe granuloma. No pleural fluid. Airway is otherwise unremarkable.  CXR 09/11/22 Heart size and mediastinal contours are unremarkable. Postop change from left upper lobectomy. There is no pleural effusion or edema identified. No airspace opacities identified. The visualized osseous structures are notable for bilateral glenohumeral joint osteoarthritis and thoracic spondylosis.  PFT:    Latest Ref Rng & Units 09/11/2022    9:47 AM  PFT Results  FVC-Pre L 2.96   FVC-Predicted Pre % 70   FVC-Post L 3.22   FVC-Predicted Post % 76   Pre FEV1/FVC % % 54   Post FEV1/FCV % % 57   FEV1-Pre L 1.58   FEV1-Predicted Pre % 52   FEV1-Post L 1.83   DLCO uncorrected ml/min/mmHg 16.42   DLCO UNC% % 65   DLCO corrected ml/min/mmHg 16.42   DLCO COR %Predicted % 65   DLVA Predicted % 78   TLC L 6.78   TLC % Predicted % 96   RV % Predicted % 132     Labs:  Path:  Echo 03/24/22: LV EF 40-45%. RV systolic function and size is normal.   Heart Catheterization:  Assessment & Plan:   Asthma-COPD overlap syndrome (HCC) - Plan: azithromycin (ZITHROMAX) 250 MG tablet  Discussion: Jaedon Siler is a 78 year old male, former smoker with CAD, hypertension, hyperlipidemia and lung cancer  s/p LUL lobectomy 10/30/2018 who returns to pulmonary clinic for asthma-COPD.   Asthma-Chronic Obstructive Pulmonary Disease (COPD) He is having possible flare in his breathing with increased chest tightness, cough and need for albuterol.  -Continue Trelegy to 200. -Continue Prednisone to 15mg  daily. -Continue nebulizer treatments 3 times daily. -Continue Roflumilast 500mg  daily. - start Zpak antibiotic - continue dupixent injections - if not better after Zpak, needs to contact clinic and will start prolonged steroid course.  Follow-up in 4 weeks.  Melody Comas, MD [facility] Office: [phone]   Current Outpatient Medications:    albuterol (PROVENTIL) (2.5 MG/3ML) 0.083% nebulizer solution, USE 1 VIAL VIA NEBULIZER FOUR TIMES DAILY AS NEEDED, Disp: 360 mL, Rfl: 2   albuterol (VENTOLIN HFA) 108 (90 Base) MCG/ACT inhaler, INHALE 2 PUFFS BY MOUTH EVERY 4 TO 6 HOURS AS NEEDED, Disp: 20.1 g, Rfl: 1   aspirin 81 MG chewable tablet, CHEW AND SWALLOW 1 TABLET(81  MG) BY MOUTH DAILY (Patient taking differently: Chew 81 mg by mouth daily at 12 noon.), Disp: 30 tablet, Rfl: 2   atorvastatin (LIPITOR) 40 MG tablet, TAKE 1 TABLET(40 MG) BY MOUTH DAILY AT 6 PM, Disp: 90 tablet, Rfl: 3   azithromycin (ZITHROMAX) 250 MG tablet, Take as directed, Disp: 6 tablet, Rfl: 0   buPROPion (WELLBUTRIN XL) 300 MG 24 hr tablet, Take 300 mg by mouth every morning., Disp: , Rfl: 1   citalopram (CELEXA) 40 MG tablet, Take 40 mg by mouth every morning., Disp: , Rfl: 0   Dupilumab (DUPIXENT) 300 MG/2ML SOAJ, Inject 300 mg into the skin every 14 (fourteen) days., Disp: 12 mL, Rfl: 1   Fluticasone-Umeclidin-Vilant (TRELEGY ELLIPTA) 200-62.5-25 MCG/ACT AEPB, Inhale 1 puff into the lungs daily., Disp: 28 each, Rfl: 11   glipiZIDE (GLUCOTROL XL) 2.5 MG 24 hr tablet, Take 1 tablet (2.5 mg total) by mouth daily with breakfast., Disp: 30 tablet, Rfl: 2   metoprolol succinate (TOPROL-XL) 25 MG 24 hr  tablet, TAKE 1/2 TABLET(12.5 MG) BY MOUTH DAILY, Disp: 45 tablet, Rfl: 6   olmesartan (BENICAR) 40 MG tablet, Take 1 tablet (40 mg total) by mouth daily., Disp: 30 tablet, Rfl: 11   predniSONE (DELTASONE) 10 MG tablet, Take 1.5 tablets (15 mg total) by mouth daily with breakfast., Disp: 45 tablet, Rfl: 0   roflumilast (DALIRESP) 500 MCG TABS tablet, Take 1 tablet (500 mcg total) by mouth daily., Disp: 30 tablet, Rfl: 11   zolpidem (AMBIEN) 5 MG tablet, Take 1 tablet (5 mg total) by mouth at bedtime as needed for sleep., Disp: 15 tablet, Rfl: 1

## 2023-12-18 NOTE — Patient Instructions (Addendum)
 Start Zpak antibiotic for your increase sputum production  Continue prednisone 15mg  daily  Continue dupixent injections  Continue trelegy 1 puff daily  Use albuterol inhaler 1-2 puffs every 4-6 hours or albuterol nebulizer treatment every 4-6 hours as needed  Continue roflumilast daily  Follow up in 4 weeks

## 2023-12-22 ENCOUNTER — Encounter: Payer: Self-pay | Admitting: Internal Medicine

## 2023-12-22 ENCOUNTER — Ambulatory Visit: Admitting: Internal Medicine

## 2023-12-22 ENCOUNTER — Telehealth: Payer: Self-pay | Admitting: Internal Medicine

## 2023-12-22 VITALS — BP 132/78 | HR 99 | Ht 68.0 in | Wt 184.0 lb

## 2023-12-22 DIAGNOSIS — R490 Dysphonia: Secondary | ICD-10-CM | POA: Diagnosis not present

## 2023-12-22 DIAGNOSIS — J4489 Other specified chronic obstructive pulmonary disease: Secondary | ICD-10-CM

## 2023-12-22 MED ORDER — FAMOTIDINE 20 MG PO TABS: ORAL_TABLET | ORAL | 11 refills | Status: DC

## 2023-12-22 MED ORDER — IPRATROPIUM-ALBUTEROL 0.5-2.5 (3) MG/3ML IN SOLN: 3.0000 mL | Freq: Four times a day (QID) | RESPIRATORY_TRACT | 11 refills | Status: DC

## 2023-12-22 MED ORDER — PANTOPRAZOLE SODIUM 40 MG PO TBEC: 40.0000 mg | DELAYED_RELEASE_TABLET | Freq: Every day | ORAL | 2 refills | Status: DC

## 2023-12-22 MED ORDER — BUDESONIDE 0.25 MG/2ML IN SUSP: 0.2500 mg | Freq: Two times a day (BID) | RESPIRATORY_TRACT | 12 refills | Status: DC

## 2023-12-22 NOTE — Progress Notes (Unsigned)
 Gary Wright, male    DOB: [birth year]   MRN: [id number]   Brief patient profile:  8  yowm quit smoking 2019  [facility] pt acute ov/ pulmonary clinic 06/02/2023   for aecopd end of July 2024        History of Present Illness  06/02/2023  Pulmonary/ 1st office eval/Dontae Minerva on ACEi Chief Complaint  Patient presents with   Acute Visit    Pt c/o cough with yellow sputum, wheezing, chest tightness over the past wk.   Dyspnea:  now 50 ft vs walks around the block at home at baseline  Cough: yellow mucus esp daytime  Sleep: flat bed with two pillows assoc with HB  SABA use: neb tid baseline and same now and now using hfa 2-3 x daily  Rec Stop lisinopril and replace with olmesartan 40 mg daily  - call if too strong  Zpak  Prednisone 10 mg take  4 each am x 2 days,   2 each am x 2 days,  1 each am x 2 days and stop  Try prilosec otc 20mg   Take 30-60 min before first meal of the day and Pepcid ac (famotidine) 20 mg one an hour before bedtime  until cough is completely gone for at least a week without the need for cough suppression   PFT's 121/22/23 FEV1 1.83 (60 % ) ratio 0.57 p 15 % improvement from saba p 0 prior to study with DLCO 16.4 (65%) and FV curve mildly concave    Dickie La recs  12/18/23  Start Zpak antibiotic for your increase sputum production Continue prednisone 15mg  daily Continue dupixent injections Continue trelegy 1 puff daily Use albuterol inhaler 1-2 puffs every 4-6 hours or albuterol nebulizer treatment every 4-6 hours as needed Continue roflumilast daily   12/22/2023  f/u ov/Alverto Shedd re: ?  aecopd  GOLD 2 criteria group E  maint on trelegy 200 and pred 15 mg daily   Chief Complaint  Patient presents with   Cough    Feels like not improving; finish atbx tomorrow; still has chest tightness and shob; has been using alb neb/hfa  Dyspnea:  more sob and chest tightness  > 1 week not much better p neb  Cough: dry hacking nothing coming up  Sleeping: flat bed / 2 pillows s  resp cc  SABA use: neb works the best  02: none    No obvious day to day or daytime variability or assoc excess/ purulent sputum or mucus plugs or hemoptysis or cp or  subjective wheeze or overt sinus or hb symptoms.    Also denies any obvious fluctuation of symptoms with weather or environmental changes or other aggravating or alleviating factors except as outlined above   No unusual exposure hx or h/o childhood pna/ asthma or knowledge of premature birth.  Current Allergies, Complete Past Medical History, Past Surgical History, Family History, and Social History were reviewed in [employer] record.  ROS  The following are not active complaints unless bolded Hoarseness, sore throat, dysphagia, dental problems, itching, sneezing,  nasal congestion or discharge of excess mucus or purulent secretions, ear ache,   fever, chills, sweats, unintended wt loss or wt gain, classically pleuritic or exertional cp,  orthopnea pnd or arm/hand swelling  or leg swelling, presyncope, palpitations, abdominal pain, anorexia, nausea, vomiting, diarrhea  or change in bowel habits or change in bladder habits, change in stools or change in urine, dysuria, hematuria,  rash, arthralgias, visual complaints, headache, numbness,  weakness or ataxia or problems with walking or coordination,  change in mood or  memory.        Current Meds  Medication Sig   albuterol (PROVENTIL) (2.5 MG/3ML) 0.083% nebulizer solution USE 1 VIAL VIA NEBULIZER FOUR TIMES DAILY AS NEEDED   albuterol (VENTOLIN HFA) 108 (90 Base) MCG/ACT inhaler INHALE 2 PUFFS BY MOUTH EVERY 4 TO 6 HOURS AS NEEDED   aspirin 81 MG chewable tablet CHEW AND SWALLOW 1 TABLET(81 MG) BY MOUTH DAILY (Patient taking differently: Chew 81 mg by mouth daily at 12 noon.)   atorvastatin (LIPITOR) 40 MG tablet TAKE 1 TABLET(40 MG) BY MOUTH DAILY AT 6 PM   azithromycin (ZITHROMAX) 250 MG tablet Take as directed   buPROPion (WELLBUTRIN XL) 300 MG 24 hr  tablet Take 300 mg by mouth every morning.   citalopram (CELEXA) 40 MG tablet Take 40 mg by mouth every morning.   Dupilumab (DUPIXENT) 300 MG/2ML SOAJ Inject 300 mg into the skin every 14 (fourteen) days.   Fluticasone-Umeclidin-Vilant (TRELEGY ELLIPTA) 200-62.5-25 MCG/ACT AEPB Inhale 1 puff into the lungs daily.   glipiZIDE (GLUCOTROL XL) 2.5 MG 24 hr tablet Take 1 tablet (2.5 mg total) by mouth daily with breakfast.   metoprolol succinate (TOPROL-XL) 25 MG 24 hr tablet TAKE 1/2 TABLET(12.5 MG) BY MOUTH DAILY   montelukast (SINGULAIR) 10 MG tablet Take 10 mg by mouth daily.   olmesartan (BENICAR) 40 MG tablet Take 1 tablet (40 mg total) by mouth daily.   predniSONE (DELTASONE) 10 MG tablet Take 1.5 tablets (15 mg total) by mouth daily with breakfast.   roflumilast (DALIRESP) 500 MCG TABS tablet Take 1 tablet (500 mcg total) by mouth daily.   zolpidem (AMBIEN) 5 MG tablet Take 1 tablet (5 mg total) by mouth at bedtime as needed for sleep.               Past Medical History:  Diagnosis Date   Acute dyspnea 03/24/2022   Acute exacerbation of chronic obstructive pulmonary disease (COPD) (HCC) 03/25/2022   CAD (coronary artery disease) of artery bypass graft 06/13/2016   Chest pain    COPD (chronic obstructive pulmonary disease) (HCC)    Depression 09/01/2007   Qualifier: Diagnosis of  By: Genelle Gather CMA, [place]     Elevated troponin 06/13/2016   History of lung cancer 11/02/2020   Hyperlipidemia 11/02/2020   Hypertension    Prolonged QT interval 11/02/2020   Stress-induced cardiomyopathy 06/13/2016      Objective:    Wts  Wt Readings from Last 3 Encounters:  12/22/23 184 lb (83.5 kg)  12/18/23 185 lb 9.6 oz (84.2 kg)  11/26/23 185 lb 4 oz (84 kg)      Vital signs reviewed  12/22/2023  - Note at rest 02 sats  99% on RA   General appearance:   amb hoarse wm nad      HEENT : Oropharynx  clear   Nasal turbinates nl    NECK :  without  apparent JVD/ palpable Nodes/TM /  classic pseudowheeze better with plb   LUNGS: no acc muscle use,  Mild barrel  contour chest wall with bilateral  Distant bs s audible wheeze and  without cough on insp or exp maneuvers  and mild  Hyperresonant  to  percussion bilaterally     CV:  RRR  no s3 or murmur or increase in P2, and no edema   ABD:  soft and nontender with pos end  insp Hoover's  in the  supine position.  No bruits or organomegaly appreciated   MS:  Nl gait/ ext warm without deformities Or obvious joint restrictions  calf tenderness, cyanosis or clubbing     SKIN: warm and dry without lesions    NEURO:  alert, approp, nl sensorium with  no motor or cerebellar deficits apparent.         Assessment

## 2023-12-22 NOTE — Telephone Encounter (Signed)
 PT calling because Dr. Francine Graven put him on Antibx for phlegm and tightness of chest. Phlegm is better but he is still having tightness and almost went to the hospital. He said Dr. Francine Graven told him if he does not get better to call and he'd put him on a Pred taper. His # is [phone]  [facility] is [facility] on [address] and [address].

## 2023-12-22 NOTE — Patient Instructions (Addendum)
 Stop trelegy until voice is better   Change nebulizer solution to duoneb to four times daily in place of trelegy with budesonide 0.25 mg twice daily   Pantoprazole (protonix) 40 mg   Take  30-60 min before first meal of the day and Pepcid (famotidine)  20 mg after supper until return to office - this is the best way to tell whether stomach acid is contributing to your problem.    GERD (REFLUX)  is an extremely common cause of respiratory symptoms just like yours , many times with no obvious heartburn at all.    It can be treated with medication, but also with lifestyle changes including elevation of the head of your bed (ideally with 6 -8inch blocks or risers  under the headboard of your bed),  Smoking cessation, avoidance of late meals, excessive alcohol, and avoid fatty foods, chocolate, peppermint, colas, red wine, and acidic juices such as orange juice.  NO MINT OR MENTHOL PRODUCTS SO NO COUGH DROPS - Luden's ok  USE SUGARLESS CANDY INSTEAD (Jolley ranchers or Stover's or Life Savers) or even ice chips will also do - the key is to swallow to prevent all throat clearing. NO OIL BASED VITAMINS - use powdered substitutes.  Avoid fish oil when coughing.   My office will be contacting you by phone for referral to ENT  - if you don't hear back from my office within one week please call us back or notify us thru MyChart and we'll address it right away.    Follow up with Dr Francine Graven as planned - call sooner if needed

## 2023-12-22 NOTE — Telephone Encounter (Signed)
 Spoke to patient and scheduled acute visit with Dr. Sherene Sires today at 2:00. Nothing further needed.

## 2023-12-23 NOTE — Assessment & Plan Note (Addendum)
 Onset summer of 2024 s improvement off acei since 06/02/23  - 12/22/2023 rec d/c trelegy short term/ replace with duoneb / bud qid - 12/22/2023 referred to ENT   Today I favor uacs over AB :  Upper airway cough syndrome (previously labeled PNDS),  is so named because it's frequently impossible to sort out how much is  CR/sinusitis with freq throat clearing (which can be related to primary GERD)   vs  causing  secondary (" extra esophageal")  GERD from wide swings in gastric pressure that occur with throat clearing, often  promoting self use of mint and menthol lozenges that reduce the lower esophageal sphincter tone and exacerbate the problem further in a cyclical fashion.   These are the same pts (now being labeled as having "irritable larynx syndrome" by some cough centers) who not infrequently have a history of having failed to tolerate ace inhibitors,  dry powder inhalers (both may apply here)  or biphosphonates or report having atypical/extraesophageal reflux symptoms (LPR) that don't respond to standard doses of PPI  and are easily confused as having aecopd vs asthma flares by even experienced allergists/ pulmonologists (myself included).   Very difficult to sort out copd for pseudoasthma for me clincally today so rec above measures plus max gerd rx and referred to ENT.  Rx copd per Dr Dickie La f/u already planned          Each maintenance medication was reviewed in detail including emphasizing most importantly the difference between maintenance and prns and under what circumstances the prns are to be triggered using an action plan format where appropriate.  Total time for H and P, chart review, counseling, reviewing dpi/hfa/ neb  device(s) and generating customized AVS unique to this acute office visit / same day charting = 21 min

## 2023-12-25 ENCOUNTER — Other Ambulatory Visit: Payer: Self-pay | Admitting: Pulmonary Disease

## 2023-12-25 DIAGNOSIS — J4489 Other specified chronic obstructive pulmonary disease: Secondary | ICD-10-CM

## 2023-12-26 NOTE — Telephone Encounter (Signed)
 Patient was seen on 3/3 for an acute visit, prednisone was not called in- requesting it be sent to [facility] on [address].

## 2023-12-27 ENCOUNTER — Other Ambulatory Visit: Payer: Self-pay | Admitting: Pulmonary Disease

## 2023-12-27 DIAGNOSIS — J4489 Other specified chronic obstructive pulmonary disease: Secondary | ICD-10-CM

## 2023-12-31 ENCOUNTER — Ambulatory Visit: Payer: Self-pay | Admitting: Family

## 2023-12-31 NOTE — Telephone Encounter (Signed)
  Chief Complaint: hand and neck pain Symptoms: pain and swelling intermittently Frequency: chronic worsening over the last 3 weeks Pertinent Negatives: Patient denies numbness, tingling, discoloration Disposition: [] ED /[] Urgent Care (no appt availability in office) / [x] Appointment(In office/virtual)/ []  [facility]/ [] Home Care/ [] Refused Recommended Disposition /[] [facility]/ []  Follow-up with PCP Additional Notes: Patient calls reporting worsening bilateral hand and neck pain. Patient states his fingers are disfigured due to arthritis but feels symptoms have been worsening recently. States pain is minimal today, and there is no swelling at this time. Per protocol, patient to be evaluated within 3 days. First available appointment with PCP 01/02/24 @ 1130, patient scheduled.  Care advice reviewed, patient verbalized understanding and denies further questions at this time. Alerting PCP for review.    Copied from CRM [id number]. Topic: Clinical - Red Word Triage >> Dec 31, 2023  1:27 PM Isabell A wrote: [facility] that prompted transfer to Nurse Triage: Arthritis flare up, minimum pain - swelling sometimes on hands and neck Reason for Disposition  [1] Weakness or numbness in hand or fingers AND [2] present > 2 weeks  Answer Assessment - Initial Assessment Questions 1. ONSET: "When did the pain start?"     Chronic and intermittent, but this episode 3 weeks. 2. LOCATION: "Where is the pain located?"     Hands and neck 3. PAIN: "How bad is the pain?" (Scale 1-10; or mild, moderate, severe)   - MILD (1-3): doesn't interfere with normal activities   - MODERATE (4-7): interferes with normal activities (e.g., work or school) or awakens from sleep   - SEVERE (8-10): excruciating pain, unable to use hand at all     Neck 3/10, hands 0/10 right now 4. WORK OR EXERCISE: "Has there been any recent work or exercise that involved this part (i.e., hand or wrist) of the body?"      Denies 5. CAUSE: "What do you think is causing the pain?"     Arthritis 6. AGGRAVATING FACTORS: "What makes the pain worse?" (e.g., using computer)     Nothing he can think of 7. OTHER SYMPTOMS: "Do you have any other symptoms?" (e.g., neck pain, swelling, rash, numbness, fever)     Neck pain  Protocols used: Hand and Wrist Pain-A-AH

## 2024-01-02 ENCOUNTER — Ambulatory Visit (INDEPENDENT_AMBULATORY_CARE_PROVIDER_SITE_OTHER): Admitting: Family

## 2024-01-02 ENCOUNTER — Other Ambulatory Visit (HOSPITAL_COMMUNITY): Payer: Self-pay

## 2024-01-02 ENCOUNTER — Telehealth: Payer: Self-pay

## 2024-01-02 ENCOUNTER — Encounter: Payer: Self-pay | Admitting: Family

## 2024-01-02 ENCOUNTER — Other Ambulatory Visit: Payer: Self-pay | Admitting: Nurse Practitioner

## 2024-01-02 VITALS — BP 152/74 | HR 72 | Temp 97.6°F | Ht 68.0 in | Wt 186.6 lb

## 2024-01-02 DIAGNOSIS — M79642 Pain in left hand: Secondary | ICD-10-CM | POA: Diagnosis not present

## 2024-01-02 DIAGNOSIS — M79641 Pain in right hand: Secondary | ICD-10-CM | POA: Diagnosis not present

## 2024-01-02 DIAGNOSIS — M542 Cervicalgia: Secondary | ICD-10-CM

## 2024-01-02 MED ORDER — CYCLOBENZAPRINE HCL 5 MG PO TABS: 5.0000 mg | ORAL_TABLET | Freq: Two times a day (BID) | ORAL | 1 refills | Status: DC | PRN

## 2024-01-02 MED ORDER — MELOXICAM 15 MG PO TABS: 7.5000 mg | ORAL_TABLET | Freq: Two times a day (BID) | ORAL | 1 refills | Status: DC

## 2024-01-02 NOTE — Patient Instructions (Addendum)
 It was very nice to see you today!   YOUR PLAN:  -ARTHRITIS: To manage your symptoms, I am prescribing meloxicam twice daily to reduce inflammation and a muscle relaxer (5-10 mg) to be taken twice a day if needed.  Please split the meloxicam dose (1/2 pill in am & 1/2 pill in pm) to protect your kidney function and  be sure you stay well-hydrated - 2 liters of water daily. Schedule a 6 week follow up to see how you are responding to the treatment and may consider a short course of prednisone if necessary.  -DIABETES MELLITUS:  You are managing it well with glipizide. We will schedule a follow-up in six weeks to monitor your blood sugar levels and ensure they remain stable.      PLEASE NOTE:  If you had any lab tests please let us know if you have not heard back within a few days. You may see your results on MyChart before we have a chance to review them but we will give you a call once they are reviewed by [place]. If we ordered any referrals today, please let us know if you have not heard from their office within the next week.

## 2024-01-02 NOTE — Progress Notes (Signed)
 Patient ID: Gary Wright, male    DOB: 06/27/1946, 78 y.o.   MRN: [id number]  Chief Complaint  Patient presents with   Arthritis    Pt c/o neck and right hand pain/swelling, Present for 2 weeks. Has tried prednisone in the past which does help sx.      Discussed the use of AI scribe software for clinical note transcription with the patient, who gave verbal consent to proceed.  History of Present Illness   The patient, with a history of arthritis and COPD, presents with persistent joint pain and swelling in the hands and neck. The pain is described as crippling, with visible deformity in the hands. The patient has been wearing a brace for support. The neck pain is constant and is not relieved by any particular position. The patient reports that the pain has been ongoing for over a month and is not associated with any particular movements or positions. The patient was previously on prednisone, which seemed to alleviate the symptoms, but has been off the medication for the last month. The patient is currently on Dupixent injections for COPD, which is in the second month of treatment. The patient reports no side effects from the Dupixent. The patient also has a history of elevated blood sugar, which is being managed with glipizide. The patient reports no issues with the medication.     Assessment & Plan:     Neck/Hand Arthritis - Chronic arthritis with hand swelling and neck pain. Symptoms worsened after stopping prednisone. Dupixent for COPD may contribute to joint pain as is a known SE. Long-term prednisone not advisable due to side effects. Meloxicam preferred over OTC NSAIDs d/t also having CKD - Prescribe meloxicam 15mg  -1/2 tab twice daily for inflammation. - Prescribe muscle relaxer (5 mg) at bid prn, can take 10mg  qhs. - Advise splitting meloxicam dose due to kidney function and ensure hydration. - Schedule follow-up in 6 weeks to assess treatment response check BMP and consider short  prednisone course if symptoms persist  T2 Diabetes Mellitus - On glipizide 2.5mg  ER qam, current dose effective. Prednisone discontinuation due to long term SE and starting Dupixent. Would prefer not to restart as pt has had steroid induced hyperglycemia with A1C >7 and currently on Glipizide. - Schedule follow-up in six weeks to recheck A1C, BMP.     Subjective:    Outpatient Medications Prior to Visit  Medication Sig Dispense Refill   albuterol (PROVENTIL) (2.5 MG/3ML) 0.083% nebulizer solution USE 1 VIAL VIA NEBULIZER FOUR TIMES DAILY AS NEEDED 360 mL 2   albuterol (VENTOLIN HFA) 108 (90 Base) MCG/ACT inhaler INHALE 2 PUFFS BY MOUTH EVERY 4 TO 6 HOURS AS NEEDED 20.1 g 1   aspirin 81 MG chewable tablet CHEW AND SWALLOW 1 TABLET(81 MG) BY MOUTH DAILY (Patient taking differently: Chew 81 mg by mouth daily at 12 noon.) 30 tablet 2   atorvastatin (LIPITOR) 40 MG tablet TAKE 1 TABLET(40 MG) BY MOUTH DAILY AT 6 PM 90 tablet 3   budesonide (PULMICORT) 0.25 MG/2ML nebulizer solution Take 2 mLs (0.25 mg total) by nebulization 2 (two) times daily. 60 mL 12   buPROPion (WELLBUTRIN XL) 300 MG 24 hr tablet Take 300 mg by mouth every morning.  1   citalopram (CELEXA) 40 MG tablet Take 40 mg by mouth every morning.  0   Dupilumab (DUPIXENT) 300 MG/2ML SOAJ Inject 300 mg into the skin every 14 (fourteen) days. 12 mL 1   famotidine (PEPCID) 20 MG tablet One  after supper 30 tablet 11   glipiZIDE (GLUCOTROL XL) 2.5 MG 24 hr tablet Take 1 tablet (2.5 mg total) by mouth daily with breakfast. 30 tablet 2   ipratropium-albuterol (DUONEB) 0.5-2.5 (3) MG/3ML SOLN Take 3 mLs by nebulization 4 (four) times daily. 360 mL 11   metoprolol succinate (TOPROL-XL) 25 MG 24 hr tablet TAKE 1/2 TABLET(12.5 MG) BY MOUTH DAILY 45 tablet 6   montelukast (SINGULAIR) 10 MG tablet Take 10 mg by mouth daily.     olmesartan (BENICAR) 40 MG tablet Take 1 tablet (40 mg total) by mouth daily. 30 tablet 11   pantoprazole (PROTONIX) 40 MG  tablet Take 1 tablet (40 mg total) by mouth daily. Take 30-60 min before first meal of the day 30 tablet 2   roflumilast (DALIRESP) 500 MCG TABS tablet Take 1 tablet (500 mcg total) by mouth daily. 30 tablet 11   zolpidem (AMBIEN) 5 MG tablet Take 1 tablet (5 mg total) by mouth at bedtime as needed for sleep. 15 tablet 1   azithromycin (ZITHROMAX) 250 MG tablet Take as directed (Patient not taking: Reported on 01/02/2024) 6 tablet 0   predniSONE (DELTASONE) 10 MG tablet TAKE 1 & 1/2 (ONE & ONE-HALF) TABLETS BY MOUTH ONCE DAILY WITH BREAKFAST (Patient not taking: Reported on 01/02/2024) 45 tablet 0   No facility-administered medications prior to visit.   Past Medical History:  Diagnosis Date   Acute dyspnea 03/24/2022   Acute exacerbation of chronic obstructive pulmonary disease (COPD) (HCC) 03/25/2022   CAD (coronary artery disease) of artery bypass graft 06/13/2016   Chest pain    COPD (chronic obstructive pulmonary disease) (HCC)    COPD with acute exacerbation (HCC) 11/02/2020   Quit smokig 2019  - PFT's  121/22/23  FEV1 1.83 (60 % ) ratio 0.57  p 15 % improvement from saba p 0 prior to study with DLCO  16.4 (65%)   and FV curve mildly concave  - try off acei and on otc gerd rx 06/02/2023 >>>       Depression 09/01/2007   Qualifier: Diagnosis of  By: Genelle Gather CMA, [place]     Elevated troponin 06/13/2016   History of lung cancer 11/02/2020   Hyperlipidemia 11/02/2020   Hypertension    Prolonged QT interval 11/02/2020   Stress-induced cardiomyopathy 06/13/2016   TOBACCO ABUSE 09/01/2007   Qualifier: Diagnosis of   By: Genelle Gather CMA, [place]         Past Surgical History:  Procedure Laterality Date   BACK SURGERY     fusion in 1973   CARDIAC CATHETERIZATION N/A 06/13/2016   Procedure: Left Heart Cath and Coronary Angiography;  Surgeon: Yvonne Kendall, MD;  [facility] INVASIVE CV LAB;  Service: Cardiovascular;  Laterality: N/A;   INGUINAL HERNIA REPAIR Right 04/21/2018   Procedure: RIGHT  INGUINAL HERNIA REPAIR ERAS PATHWAY;  Surgeon: Harriette Bouillon, MD;  Location: [facility] OR;  Service: General;  Laterality: Right;  TAP BLOCK   INSERTION OF MESH Right 04/21/2018   Procedure: INSERTION OF MESH;  Surgeon: Harriette Bouillon, MD;  Location: [facility] OR;  Service: General;  Laterality: Right;   TONSILLECTOMY     No Known Allergies    Objective:    Physical Exam Vitals and nursing note reviewed.  Constitutional:      General: He is not in acute distress.    Appearance: Normal appearance.  HENT:     Head: Normocephalic.  Cardiovascular:     Rate and Rhythm: Normal rate and regular rhythm.  Pulmonary:     Effort: Pulmonary effort is normal.     Breath sounds: Normal breath sounds.  Musculoskeletal:     Right hand: Swelling (mild) and bony tenderness present. Decreased range of motion. Decreased strength.     Left hand: Swelling (mild) and bony tenderness present. Decreased range of motion. Decreased strength.     Cervical back: No edema, erythema or signs of trauma. Pain with movement present. Decreased range of motion.  Skin:    General: Skin is warm and dry.  Neurological:     Mental Status: He is alert and oriented to person, place, and time.  Psychiatric:        Mood and Affect: Mood normal.    BP (!) 152/74 (BP Location: Left Arm, Patient Position: Sitting, Cuff Size: Large)   Pulse 72   Temp 97.6 F (36.4 C) (Temporal)   Ht 5\' 8"  (1.727 m)   Wt 186 lb 9.6 oz (84.6 kg)   SpO2 99%   BMI 28.37 kg/m  Wt Readings from Last 3 Encounters:  01/02/24 186 lb 9.6 oz (84.6 kg)  12/22/23 184 lb (83.5 kg)  12/18/23 185 lb 9.6 oz (84.2 kg)      Dulce Sellar, NP

## 2024-01-02 NOTE — Telephone Encounter (Signed)
 Pharmacy Patient Advocate Encounter  Received notification from [facility] that Prior Authorization for Roflumilast tablets  has been APPROVED from 01/02/2024 to 10/20/2024. Ran test claim, Copay is $0.00. This test claim was processed through [facility]- copay amounts may vary at other pharmacies due to pharmacy/plan contracts, or as the patient moves through the different stages of their insurance plan.

## 2024-01-02 NOTE — Telephone Encounter (Signed)
*  Pulm  Pharmacy Patient Advocate Encounter   Received notification from CoverMyMeds that prior authorization for Roflumilast tablets  is required/requested.   Insurance verification completed.   The patient is insured through [facility] .   Per test claim: PA required; PA submitted to above mentioned insurance via CoverMyMeds Key/confirmation #/EOC [id number] Status is pending

## 2024-01-07 ENCOUNTER — Other Ambulatory Visit: Payer: Self-pay | Admitting: Nurse Practitioner

## 2024-01-12 ENCOUNTER — Other Ambulatory Visit: Payer: Self-pay

## 2024-01-12 NOTE — Progress Notes (Signed)
 Specialty Pharmacy Refill Coordination Note  Gary Wright is a 78 y.o. male contacted today regarding refills of specialty medication(s) Dupilumab (Dupixent)   Patient requested Delivery   Delivery date: 01/14/24   Verified address: [address]   [place] [address]   Medication will be filled on 01/13/24.

## 2024-01-13 ENCOUNTER — Other Ambulatory Visit: Payer: Self-pay

## 2024-01-13 ENCOUNTER — Ambulatory Visit: Payer: Medicare Other | Admitting: Pulmonary Disease

## 2024-01-13 ENCOUNTER — Encounter: Payer: Self-pay | Admitting: Pulmonary Disease

## 2024-01-13 VITALS — BP 167/81 | HR 82 | Ht 68.0 in | Wt 188.7 lb

## 2024-01-13 DIAGNOSIS — J4489 Other specified chronic obstructive pulmonary disease: Secondary | ICD-10-CM

## 2024-01-13 MED ORDER — PREDNISONE 10 MG PO TABS
ORAL_TABLET | ORAL | 0 refills | Status: DC
Start: 2024-01-13 — End: 2024-01-26

## 2024-01-13 MED ORDER — BUDESONIDE 0.5 MG/2ML IN SUSP: 0.5000 mg | Freq: Two times a day (BID) | RESPIRATORY_TRACT | 11 refills | Status: DC

## 2024-01-13 NOTE — Progress Notes (Unsigned)
 Synopsis: Referred in August 2023 for COPD by Rhetta Mura, PA  Subjective:   PATIENT ID: Gary Wright GENDER: male DOB: [birth year], MRN: [id number]  HPI  Chief Complaint  Patient presents with   Follow-up    Pt states he had stop his Rx of pred, since stopping feels very weak & tired.. feels achy    Gary Wright is a 78 year old male, former smoker with CAD, hypertension, hyperlipidemia and lung cancer s/p LUL lobectomy 10/30/2018 who returns to pulmonary clinic for COPD.   Initial OV 05/23/22 He was admitted 6/4 to 6/7 for COPD exacerbation. He is currently on Trelegy ellipta 1 puff daily. He is using albuterol inhaler 1- 2 times per day. He is using albuterol nebulizer treatments 3 times per day.   He has on going dyspnea. He does have some wheezing intermittently. He does have cough with some phlegm, that is light brown in color.   He has oxygen equipment at home and is not using it. He denies issues with seasonal allergies. He does have heart burn issues more so at night. He is sleeping with head of bed elevated.   He is retired. He lives with his wife. He quit smoking in 2019. He smoked 1.5 pack per day for 20 years.   OV 01/2023 He is on trelegy ellipta 1 puff daily and as needed albuterol inhaler or nebulizer treatment. He is also on roflumilast every other day. He is coughing less and having less mucous production.  OV 09/24/23 He was last seen by Rhunette Croft, NP 07/28/23. The symptoms have progressed to the point where the patient reports being unable to do much anymore. The patient has found some relief with a higher dose of prednisone, but the relief is only partial. The patient has increased the frequency of nebulizer treatments from twice to three times a day due to the worsening symptoms. The patient has previously been on Trelegy 200, but is currently on Trelegy 100. The patient reports no significant improvement with the use of roflumilast. The patient has a  history of smoking but quit in 2019. There is no current exposure to secondhand smoke.  OV 12/18/2023 He is experiencing a recent flare-up of asthma, characterized by increased use of his rescue inhaler and coughing up yellow mucus. He can usually anticipate these flare-ups as they are preceded by these symptoms. During the current episode, he did not require an overnight hospital stay but did receive a shot of medication.  He is currently experiencing increased wheezing, chest tightness, and a significant amount of coughing, with the mucus changing from clear to yellow. No side effects from his current medications.  He started Dupixent last month and is scheduled to take his third shot next week. He is currently on a daily dose of 15 mg of prednisone. He also uses a Trelegy inhaler, taking one puff daily, and utilizes an albuterol nebulizer first thing in the morning. Recently, he has had to use his rescue inhaler more frequently than usual.  OV 01/13/24 He has been experiencing weakness, which he attributes to stopping prednisone after taking it for two to three months due to his prescription running out and no refills being available. He is awaiting a new delivery of his injections and has not had any adverse reactions to them.  He uses Pulmicort and DuoNeb nebulizer treatments twice daily and has not been using inhalers. He was previously on Trelegy but stopped due to throat irritation. Since starting new nebulizer  medications, he initially coughed up thick yellow mucus, which has since cleared, and he is now experiencing minimal wheezing and coughing.  He is currently taking two medications for reflux, which have alleviated his heartburn. He also has a persistent sore throat and questions the necessity of continuing a 500 mg medication due to a discount running out.  He quit smoking, although his wife smokes but not around him. He ensures adherence to his nebulizer treatment schedule and has the  time to complete them as prescribed.  Past Medical History:  Diagnosis Date   Acute dyspnea 03/24/2022   Acute exacerbation of chronic obstructive pulmonary disease (COPD) (HCC) 03/25/2022   CAD (coronary artery disease) of artery bypass graft 06/13/2016   Chest pain    COPD (chronic obstructive pulmonary disease) (HCC)    COPD with acute exacerbation (HCC) 11/02/2020   Quit smokig 2019  - PFT's  121/22/23  FEV1 1.83 (60 % ) ratio 0.57  p 15 % improvement from saba p 0 prior to study with DLCO  16.4 (65%)   and FV curve mildly concave  - try off acei and on otc gerd rx 06/02/2023 >>>       Depression 09/01/2007   Qualifier: Diagnosis of  By: Genelle Gather CMA, [place]     Elevated troponin 06/13/2016   History of lung cancer 11/02/2020   Hyperlipidemia 11/02/2020   Hypertension    Prolonged QT interval 11/02/2020   Stress-induced cardiomyopathy 06/13/2016   TOBACCO ABUSE 09/01/2007   Qualifier: Diagnosis of   By: Genelle Gather CMA, [place]           Family History  Problem Relation Age of Onset   Hypertension Mother    Hypertension Father      Social History   Socioeconomic History   Marital status: Married    Spouse name: Marisue Humble   Number of children: 2   Years of education: Not on file   Highest education level: Not on file  Occupational History   Occupation: retired    Associate Professor: [employer]  Tobacco Use   Smoking status: Former    Current packs/day: 0.00    Types: Cigarettes    Quit date: 2019    Years since quitting: 6.2   Smokeless tobacco: Never  Vaping Use   Vaping status: Never Used  Substance and Sexual Activity   Alcohol use: No    Comment: No alcohol for 17 years   Drug use: No   Sexual activity: Not on file  Other Topics Concern   Not on file  Social History Narrative   Married for 27 years with 3 children   Social Drivers of Corporate investment banker Strain: Low Risk  (11/18/2023)   Overall Financial Resource Strain (CARDIA)    Difficulty of Paying  Living Expenses: Not hard at all  Food Insecurity: No Food Insecurity (11/18/2023)   Hunger Vital Sign    Worried About Running Out of Food in the Last Year: Never true    Ran Out of Food in the Last Year: Never true  Transportation Needs: No Transportation Needs (11/18/2023)   PRAPARE - Administrator, Civil Service (Medical): No    Lack of Transportation (Non-Medical): No  Physical Activity: Insufficiently Active (11/18/2023)   Exercise Vital Sign    Days of Exercise per Week: 3 days    Minutes of Exercise per Session: 10 min  Stress: No Stress Concern Present (11/18/2023)   [employer] of Occupational Health - Occupational Stress  Questionnaire    Feeling of Stress : Not at all  Social Connections: Moderately Isolated (11/18/2023)   Social Connection and Isolation Panel [NHANES]    Frequency of Communication with Friends and Family: Three times a week    Frequency of Social Gatherings with Friends and Family: Never    Attends Religious Services: Never    Database administrator or Organizations: No    Attends Banker Meetings: Never    Marital Status: Married  Catering manager Violence: Not At Risk (11/18/2023)   Humiliation, Afraid, Rape, and Kick questionnaire    Fear of Current or Ex-Partner: No    Emotionally Abused: No    Physically Abused: No    Sexually Abused: No     No Known Allergies   Outpatient Medications Prior to Visit  Medication Sig Dispense Refill   albuterol (PROVENTIL) (2.5 MG/3ML) 0.083% nebulizer solution USE 1 VIAL VIA NEBULIZER FOUR TIMES DAILY AS NEEDED 360 mL 2   albuterol (VENTOLIN HFA) 108 (90 Base) MCG/ACT inhaler INHALE 2 PUFFS BY MOUTH EVERY 4 TO 6 HOURS AS NEEDED 20.1 g 1   atorvastatin (LIPITOR) 40 MG tablet TAKE 1 TABLET(40 MG) BY MOUTH DAILY AT 6 PM 90 tablet 3   budesonide (PULMICORT) 0.25 MG/2ML nebulizer solution Take 2 mLs (0.25 mg total) by nebulization 2 (two) times daily. 60 mL 12   buPROPion (WELLBUTRIN XL) 300  MG 24 hr tablet Take 300 mg by mouth every morning.  1   citalopram (CELEXA) 40 MG tablet Take 40 mg by mouth every morning.  0   cyclobenzaprine (FLEXERIL) 5 MG tablet Take 1-2 tablets (5-10 mg total) by mouth 2 (two) times daily as needed for muscle spasms (Neck or hand pain. MAY cause drowsiness.). 30 tablet 1   Dupilumab (DUPIXENT) 300 MG/2ML SOAJ Inject 300 mg into the skin every 14 (fourteen) days. 12 mL 1   famotidine (PEPCID) 20 MG tablet One after supper 30 tablet 11   glipiZIDE (GLUCOTROL XL) 2.5 MG 24 hr tablet Take 1 tablet (2.5 mg total) by mouth daily with breakfast. 30 tablet 2   ipratropium-albuterol (DUONEB) 0.5-2.5 (3) MG/3ML SOLN Take 3 mLs by nebulization 4 (four) times daily. 360 mL 11   meloxicam (MOBIC) 15 MG tablet Take 0.5 tablets (7.5 mg total) by mouth 2 (two) times daily. For neck and hand pain. 30 tablet 1   metoprolol succinate (TOPROL-XL) 25 MG 24 hr tablet TAKE 1/2 TABLET(12.5 MG) BY MOUTH DAILY 45 tablet 6   montelukast (SINGULAIR) 10 MG tablet Take 10 mg by mouth daily.     olmesartan (BENICAR) 40 MG tablet Take 1 tablet (40 mg total) by mouth daily. 30 tablet 11   pantoprazole (PROTONIX) 40 MG tablet Take 1 tablet (40 mg total) by mouth daily. Take 30-60 min before first meal of the day 30 tablet 2   zolpidem (AMBIEN) 5 MG tablet Take 1 tablet (5 mg total) by mouth at bedtime as needed for sleep. 15 tablet 1   roflumilast (DALIRESP) 500 MCG TABS tablet Take 1 tablet by mouth once daily (Patient not taking: Reported on 01/13/2024) 30 tablet 0   No facility-administered medications prior to visit.    Review of Systems  Constitutional:  Negative for chills, fever, malaise/fatigue and weight loss.  HENT:  Negative for congestion, sinus pain and sore throat.   Eyes: Negative.   Respiratory:  Positive for cough, sputum production and shortness of breath. Negative for hemoptysis.   Cardiovascular:  Negative  for chest pain, palpitations, orthopnea, claudication and  leg swelling.  Gastrointestinal:  Negative for abdominal pain, heartburn, nausea and vomiting.  Genitourinary: Negative.   Musculoskeletal:  Negative for joint pain and myalgias.  Skin:  Negative for rash.  Neurological:  Negative for weakness.  Endo/Heme/Allergies: Negative.    Objective:   Vitals:   01/13/24 1139  BP: (!) 167/81  Pulse: 82  SpO2: 100%  Weight: 188 lb 11.2 oz (85.6 kg)  Height: 5\' 8"  (1.727 m)    Physical Exam Constitutional:      General: He is not in acute distress. HENT:     Head: Normocephalic and atraumatic.  Eyes:     Conjunctiva/sclera: Conjunctivae normal.  Cardiovascular:     Rate and Rhythm: Normal rate and regular rhythm.     Pulses: Normal pulses.     Heart sounds: Normal heart sounds. No murmur heard. Pulmonary:     Effort: Pulmonary effort is normal.     Breath sounds: No wheezing or rhonchi.  Musculoskeletal:     Right lower leg: No edema.     Left lower leg: No edema.  Skin:    General: Skin is warm and dry.  Neurological:     General: No focal deficit present.     Mental Status: He is alert.    CBC    Component Value Date/Time   WBC 8.5 11/06/2023 1038   RBC 4.13 (L) 11/06/2023 1038   HGB 13.3 11/06/2023 1038   HCT 39.9 11/06/2023 1038   PLT 293.0 11/06/2023 1038   MCV 96.6 11/06/2023 1038   MCH 32.8 11/03/2023 1133   MCHC 33.3 11/06/2023 1038   RDW 13.7 11/06/2023 1038   LYMPHSABS 0.8 11/06/2023 1038   MONOABS 0.2 11/06/2023 1038   EOSABS 0.0 11/06/2023 1038   BASOSABS 0.0 11/06/2023 1038      Latest Ref Rng & Units 11/18/2023   11:31 AM 11/06/2023   10:38 AM 11/03/2023   11:33 AM  BMP  Glucose 70 - 99 mg/dL 960  454  098   BUN 6 - 23 mg/dL 22  24  18    Creatinine 0.40 - 1.50 mg/dL 1.19  1.47  8.29   Sodium 135 - 145 mEq/L 138  137  132   Potassium 3.5 - 5.1 mEq/L 4.2  4.4  3.6   Chloride 96 - 112 mEq/L 101  100  98   CO2 19 - 32 mEq/L 30  28  23    Calcium 8.4 - 10.5 mg/dL 9.3  8.9  9.2    Chest imaging: CT  Chest LCS 2024 Mediastinum/Nodes: No pathologically enlarged mediastinal or axillary lymph nodes. Hilar regions are difficult to definitively evaluate without IV contrast. There may be distal esophageal wall thickening which can be seen with gastroesophageal reflux disease.   Lungs/Pleura: Centrilobular and paraseptal emphysema. Left upper lobectomy. Calcified left lower lobe granuloma. No pleural fluid. Airway is otherwise unremarkable.  CXR 09/11/22 Heart size and mediastinal contours are unremarkable. Postop change from left upper lobectomy. There is no pleural effusion or edema identified. No airspace opacities identified. The visualized osseous structures are notable for bilateral glenohumeral joint osteoarthritis and thoracic spondylosis.  PFT:    Latest Ref Rng & Units 09/11/2022    9:47 AM  PFT Results  FVC-Pre L 2.96   FVC-Predicted Pre % 70   FVC-Post L 3.22   FVC-Predicted Post % 76   Pre FEV1/FVC % % 54   Post FEV1/FCV % % 57   FEV1-Pre  L 1.58   FEV1-Predicted Pre % 52   FEV1-Post L 1.83   DLCO uncorrected ml/min/mmHg 16.42   DLCO UNC% % 65   DLCO corrected ml/min/mmHg 16.42   DLCO COR %Predicted % 65   DLVA Predicted % 78   TLC L 6.78   TLC % Predicted % 96   RV % Predicted % 132     Labs:  Path:  Echo 03/24/22: LV EF 40-45%. RV systolic function and size is normal.   Heart Catheterization:  Assessment & Plan:   No diagnosis found.  Discussion: Gary Wright is a 78 year old male, former smoker with CAD, hypertension, hyperlipidemia and lung cancer s/p LUL lobectomy 10/30/2018 who returns to pulmonary clinic for asthma-COPD.   Asthma-Chronic Obstructive Pulmonary Disease (COPD) He is having possible flare in his breathing with increased chest tightness, cough and need for albuterol.  -Continue budesonide 0.5mg  twice daily -Continue duoneb nebs 4 times per day -Prednisone taper: 15mg  daily x 5 days, 10mg  daily x 5 days, 5mg  daily x 5 days -ok to  stop Roflumilast 500mg  daily. - continue dupixent injections  Follow-up in 4 weeks.  Melody Comas, MD [facility] Office: [phone]   Current Outpatient Medications:    albuterol (PROVENTIL) (2.5 MG/3ML) 0.083% nebulizer solution, USE 1 VIAL VIA NEBULIZER FOUR TIMES DAILY AS NEEDED, Disp: 360 mL, Rfl: 2   albuterol (VENTOLIN HFA) 108 (90 Base) MCG/ACT inhaler, INHALE 2 PUFFS BY MOUTH EVERY 4 TO 6 HOURS AS NEEDED, Disp: 20.1 g, Rfl: 1   atorvastatin (LIPITOR) 40 MG tablet, TAKE 1 TABLET(40 MG) BY MOUTH DAILY AT 6 PM, Disp: 90 tablet, Rfl: 3   budesonide (PULMICORT) 0.25 MG/2ML nebulizer solution, Take 2 mLs (0.25 mg total) by nebulization 2 (two) times daily., Disp: 60 mL, Rfl: 12   buPROPion (WELLBUTRIN XL) 300 MG 24 hr tablet, Take 300 mg by mouth every morning., Disp: , Rfl: 1   citalopram (CELEXA) 40 MG tablet, Take 40 mg by mouth every morning., Disp: , Rfl: 0   cyclobenzaprine (FLEXERIL) 5 MG tablet, Take 1-2 tablets (5-10 mg total) by mouth 2 (two) times daily as needed for muscle spasms (Neck or hand pain. MAY cause drowsiness.)., Disp: 30 tablet, Rfl: 1   Dupilumab (DUPIXENT) 300 MG/2ML SOAJ, Inject 300 mg into the skin every 14 (fourteen) days., Disp: 12 mL, Rfl: 1   famotidine (PEPCID) 20 MG tablet, One after supper, Disp: 30 tablet, Rfl: 11   glipiZIDE (GLUCOTROL XL) 2.5 MG 24 hr tablet, Take 1 tablet (2.5 mg total) by mouth daily with breakfast., Disp: 30 tablet, Rfl: 2   ipratropium-albuterol (DUONEB) 0.5-2.5 (3) MG/3ML SOLN, Take 3 mLs by nebulization 4 (four) times daily., Disp: 360 mL, Rfl: 11   meloxicam (MOBIC) 15 MG tablet, Take 0.5 tablets (7.5 mg total) by mouth 2 (two) times daily. For neck and hand pain., Disp: 30 tablet, Rfl: 1   metoprolol succinate (TOPROL-XL) 25 MG 24 hr tablet, TAKE 1/2 TABLET(12.5 MG) BY MOUTH DAILY, Disp: 45 tablet, Rfl: 6   montelukast (SINGULAIR) 10 MG tablet, Take 10 mg by mouth daily., Disp: , Rfl:    olmesartan  (BENICAR) 40 MG tablet, Take 1 tablet (40 mg total) by mouth daily., Disp: 30 tablet, Rfl: 11   pantoprazole (PROTONIX) 40 MG tablet, Take 1 tablet (40 mg total) by mouth daily. Take 30-60 min before first meal of the day, Disp: 30 tablet, Rfl: 2   zolpidem (AMBIEN) 5 MG tablet, Take 1  tablet (5 mg total) by mouth at bedtime as needed for sleep., Disp: 15 tablet, Rfl: 1   roflumilast (DALIRESP) 500 MCG TABS tablet, Take 1 tablet by mouth once daily (Patient not taking: Reported on 01/13/2024), Disp: 30 tablet, Rfl: 0

## 2024-01-13 NOTE — Patient Instructions (Signed)
 Use budesonide nebulizer twice daily  Use duoneb nebulizer 4 times per day  Start prednisone taper 15mg  daily x 5 days 10mg  daily x 5 days 5mg  daily x 5 days  Continue dupixent   Ok to stop roflumilast  Follow up in 6months

## 2024-01-20 ENCOUNTER — Other Ambulatory Visit: Payer: Self-pay

## 2024-01-20 ENCOUNTER — Telehealth: Payer: Self-pay

## 2024-01-20 NOTE — Telephone Encounter (Signed)
 Copied from CRM [id number]. Topic: Clinical - Medical Advice >> Jan 20, 2024  1:25 PM Harl Bowie S wrote: Reason for CRM: Patient is taking injections and needs assistance, patient stated droplets came out but he is not sure if it is the full dosage, and is inquiring if he a full dosage or not.  ATC x1. LDVMTCB

## 2024-01-21 NOTE — Telephone Encounter (Signed)
 Spoke with Gary Wright, regarding CRM  "Patient is taking injections and needs assistance, patient stated droplets came out but he is not sure if it is the full dosage, and is inquiring if he a full dosage or not."  [facility] & was advised to take 2nd dose of dupixent so he wouldn't miss his first dose. Pharm provided pt with number to contact so he could be reimbursed for 1st dose of Dupixent [phone]. Pt has contacted number provided but states he has not been contacted back. Routing to pharmacy & Katie please advise.

## 2024-01-21 NOTE — Telephone Encounter (Signed)
 Patient is returning call concerning the injections . Someone called him back. Calling cal now and no response .Marland Kitchen Please give patient a call back concerning this information

## 2024-01-26 ENCOUNTER — Ambulatory Visit (INDEPENDENT_AMBULATORY_CARE_PROVIDER_SITE_OTHER): Payer: Medicare Other | Admitting: Family

## 2024-01-26 ENCOUNTER — Encounter: Payer: Self-pay | Admitting: Family

## 2024-01-26 VITALS — BP 108/69 | HR 73 | Temp 97.6°F | Wt 192.4 lb

## 2024-01-26 DIAGNOSIS — M25551 Pain in right hip: Secondary | ICD-10-CM | POA: Diagnosis not present

## 2024-01-26 DIAGNOSIS — M25552 Pain in left hip: Secondary | ICD-10-CM

## 2024-01-26 DIAGNOSIS — M2550 Pain in unspecified joint: Secondary | ICD-10-CM

## 2024-01-26 DIAGNOSIS — E099 Drug or chemical induced diabetes mellitus without complications: Secondary | ICD-10-CM

## 2024-01-26 DIAGNOSIS — G8929 Other chronic pain: Secondary | ICD-10-CM | POA: Insufficient documentation

## 2024-01-26 DIAGNOSIS — Z1159 Encounter for screening for other viral diseases: Secondary | ICD-10-CM | POA: Diagnosis not present

## 2024-01-26 DIAGNOSIS — N1832 Chronic kidney disease, stage 3b: Secondary | ICD-10-CM | POA: Diagnosis not present

## 2024-01-26 LAB — BASIC METABOLIC PANEL WITH GFR
BUN: 27 mg/dL — ABNORMAL HIGH (ref 6–23)
CO2: 27 meq/L (ref 19–32)
Calcium: 9.1 mg/dL (ref 8.4–10.5)
Chloride: 102 meq/L (ref 96–112)
Creatinine, Ser: 1.41 mg/dL (ref 0.40–1.50)
GFR: 48.09 mL/min — ABNORMAL LOW (ref >60.00)
Glucose, Bld: 188 mg/dL — ABNORMAL HIGH (ref 70–99)
Potassium: 4.8 meq/L (ref 3.5–5.1)
Sodium: 138 meq/L (ref 135–145)

## 2024-01-26 LAB — HEMOGLOBIN A1C: Hgb A1c MFr Bld: 7.5 % — ABNORMAL HIGH (ref 4.6–6.5)

## 2024-01-26 NOTE — Assessment & Plan Note (Signed)
 Last A1C 7.7. Diabetes likely exacerbated by past prednisone use.  Pt reports taking another round of prednisone and finished last pill today. Currently on glipizide 2.5mg  qam. - Recheck A1C today to assess glucose control. - Continue to advise on low carb diet, no sweets. - F/U in 3 mos

## 2024-01-26 NOTE — Progress Notes (Signed)
 Patient ID: Gary Wright, male    DOB: [birth year], 78 y.o.   MRN: [id number]  Chief Complaint  Patient presents with   Drug-induced diabetes mellitus  Discussed the use of AI scribe software for clinical note transcription with the patient, who gave verbal consent to proceed.  History of Present Illness The patient, with a history of lung issues and diabetes, presents with multiple complaints. He reports a malfunction with his Dupixent injection, which he administers at home for his lung condition. The injection device clicked twice immediately and the medication was seen dripping out, indicating a possible failure in the delivery of the medication. The patient is awaiting a replacement injection. The patient also reports worsening arthritis pain in his hands and hips, which has been ongoing for about ten to twelve years. The pain has become debilitating, hindering his mobility and quality of life. He has been managing the pain with ice and ibuprofen, and bursts of prednisone, but these measures are no longer effective. He has also been on a course of prednisone, which he has just completed, and is taking meloxicam currently. He reports some relief in his neck pain with the use of a muscle relaxer (cyclobenzaprine). In addition, the patient has diabetes, which is being managed with glipizide. He has been advised to recheck his labs to monitor his condition.  Assessment & Plan Chronic Hip Arthritis/Polyarthralgia - Chronic arthritis with worsening symptoms in hips (left > right) and hands. Limited relief from previous treatments, states only high dose prednisone gives him relief. Surgery not an option due to lung disease. Open to steroid injections for localized relief. Physical therapy suggested to maintain mobility. - Continue Meloxicam bid, checking BMP today. Advised on drinking 2L of water qd and to not take any other NSAIDs OTC. - Refer to [facility] for evaluation and  potential steroid injection in the hip. - Discuss potential benefits of physical therapy for arthritis management. - F/U 6 mos or   Drug induced Diabetes Mellitus - Last A1C 7.7. Diabetes likely exacerbated by past prednisone use.  Pt reports taking another round of prednisone and finished last pill today. Currently on glipizide 2.5mg  qam. - Recheck A1C today to assess glucose control. - Continue to advise on low carb diet, no sweets. - F/U in 3 mos  Chronic Obstructive Pulmonary Disease (COPD) - On Dupixent for lung inflammation. Uses inhalers daily. Takes Protonix for acid reflux management. - Continue Dupixent injections as prescribed. - Continue daily inhaler use. - Continue Protonix for acid reflux management.   Subjective:    Outpatient Medications Prior to Visit  Medication Sig Dispense Refill   albuterol (PROVENTIL) (2.5 MG/3ML) 0.083% nebulizer solution USE 1 VIAL VIA NEBULIZER FOUR TIMES DAILY AS NEEDED 360 mL 2   albuterol (VENTOLIN HFA) 108 (90 Base) MCG/ACT inhaler INHALE 2 PUFFS BY MOUTH EVERY 4 TO 6 HOURS AS NEEDED 20.1 g 1   atorvastatin (LIPITOR) 40 MG tablet TAKE 1 TABLET(40 MG) BY MOUTH DAILY AT 6 PM 90 tablet 3   budesonide (PULMICORT) 0.5 MG/2ML nebulizer solution Take 2 mLs (0.5 mg total) by nebulization 2 (two) times daily. 120 mL 11   buPROPion (WELLBUTRIN XL) 300 MG 24 hr tablet Take 300 mg by mouth every morning.  1   citalopram (CELEXA) 40 MG tablet Take 40 mg by mouth every morning.  0   cyclobenzaprine (FLEXERIL) 5 MG tablet Take 1-2 tablets (5-10 mg total) by mouth 2 (two) times daily as needed for muscle spasms (Neck  or hand pain. MAY cause drowsiness.). 30 tablet 1   Dupilumab (DUPIXENT) 300 MG/2ML SOAJ Inject 300 mg into the skin every 14 (fourteen) days. 12 mL 1   famotidine (PEPCID) 20 MG tablet One after supper 30 tablet 11   glipiZIDE (GLUCOTROL XL) 2.5 MG 24 hr tablet Take 1 tablet (2.5 mg total) by mouth daily with breakfast. 30 tablet 2    ipratropium-albuterol (DUONEB) 0.5-2.5 (3) MG/3ML SOLN Take 3 mLs by nebulization 4 (four) times daily. 360 mL 11   meloxicam (MOBIC) 15 MG tablet Take 0.5 tablets (7.5 mg total) by mouth 2 (two) times daily. For neck and hand pain. 30 tablet 1   metoprolol succinate (TOPROL-XL) 25 MG 24 hr tablet TAKE 1/2 TABLET(12.5 MG) BY MOUTH DAILY 45 tablet 6   montelukast (SINGULAIR) 10 MG tablet Take 10 mg by mouth daily.     olmesartan (BENICAR) 40 MG tablet Take 1 tablet (40 mg total) by mouth daily. 30 tablet 11   pantoprazole (PROTONIX) 40 MG tablet Take 1 tablet (40 mg total) by mouth daily. Take 30-60 min before first meal of the day 30 tablet 2   zolpidem (AMBIEN) 5 MG tablet Take 1 tablet (5 mg total) by mouth at bedtime as needed for sleep. 15 tablet 1   predniSONE (DELTASONE) 10 MG tablet Take 1.5 tablets (15 mg total) by mouth daily with breakfast for 5 days, THEN 1 tablet (10 mg total) daily with breakfast for 5 days, THEN 0.5 tablets (5 mg total) daily with breakfast for 5 days. (Patient not taking: Reported on 01/26/2024) 20 tablet 0   No facility-administered medications prior to visit.   Past Medical History:  Diagnosis Date   Acute dyspnea 03/24/2022   Acute exacerbation of chronic obstructive pulmonary disease (COPD) (HCC) 03/25/2022   CAD (coronary artery disease) of artery bypass graft 06/13/2016   Chest pain    COPD (chronic obstructive pulmonary disease) (HCC)    COPD with acute exacerbation (HCC) 11/02/2020   Quit smokig 2019  - PFT's  121/22/23  FEV1 1.83 (60 % ) ratio 0.57  p 15 % improvement from saba p 0 prior to study with DLCO  16.4 (65%)   and FV curve mildly concave  - try off acei and on otc gerd rx 06/02/2023 >>>       Depression 09/01/2007   Qualifier: Diagnosis of  By: Genelle Gather CMA, [place]     Elevated troponin 06/13/2016   History of lung cancer 11/02/2020   Hyperlipidemia 11/02/2020   Hypertension    Prolonged QT interval 11/02/2020   Stress-induced cardiomyopathy  06/13/2016   TOBACCO ABUSE 09/01/2007   Qualifier: Diagnosis of   By: Genelle Gather CMA, [place]         Past Surgical History:  Procedure Laterality Date   BACK SURGERY     fusion in 1973   CARDIAC CATHETERIZATION N/A 06/13/2016   Procedure: Left Heart Cath and Coronary Angiography;  Surgeon: Yvonne Kendall, MD;  Location: [facility] INVASIVE CV LAB;  Service: Cardiovascular;  Laterality: N/A;   INGUINAL HERNIA REPAIR Right 04/21/2018   Procedure: RIGHT INGUINAL HERNIA REPAIR ERAS PATHWAY;  Surgeon: Harriette Bouillon, MD;  Location: [facility] OR;  Service: General;  Laterality: Right;  TAP BLOCK   INSERTION OF MESH Right 04/21/2018   Procedure: INSERTION OF MESH;  Surgeon: Harriette Bouillon, MD;  Location: [facility] OR;  Service: General;  Laterality: Right;   TONSILLECTOMY     No Known Allergies    Objective:    Physical  Exam Vitals and nursing note reviewed.  Constitutional:      General: He is not in acute distress.    Appearance: Normal appearance.  HENT:     Head: Normocephalic.  Cardiovascular:     Rate and Rhythm: Normal rate and regular rhythm.  Pulmonary:     Effort: Pulmonary effort is normal.     Breath sounds: Normal breath sounds.  Musculoskeletal:     Right hand: Swelling (mild) and bony tenderness present. Decreased strength.     Left hand: Swelling (mild) and bony tenderness present. Decreased strength.     Cervical back: Normal range of motion. No edema, erythema or signs of trauma. Pain with movement present.     Right hip: Bony tenderness present. Decreased range of motion. Decreased strength.     Left hip: Bony tenderness present. Decreased range of motion. Decreased strength.  Skin:    General: Skin is warm and dry.  Neurological:     Mental Status: He is alert and oriented to person, place, and time.  Psychiatric:        Mood and Affect: Mood normal.    BP 108/69 (BP Location: Left Arm, Patient Position: Sitting, Cuff Size: Large)   Pulse 73   Temp 97.6 F (36.4 C) (Temporal)   Wt  192 lb 6 oz (87.3 kg)   SpO2 98%   BMI 29.25 kg/m  Wt Readings from Last 3 Encounters:  01/26/24 192 lb 6 oz (87.3 kg)  01/13/24 188 lb 11.2 oz (85.6 kg)  01/02/24 186 lb 9.6 oz (84.6 kg)      Dulce Sellar, NP

## 2024-01-27 LAB — HEPATITIS C ANTIBODY: Hepatitis C Ab: NONREACTIVE

## 2024-01-27 MED ORDER — GLIPIZIDE ER 2.5 MG PO TB24: 2.5000 mg | ORAL_TABLET | Freq: Two times a day (BID) | ORAL | 2 refills | Status: DC

## 2024-01-27 NOTE — Progress Notes (Signed)
 A1C (diabetes) only came down slightly. Have him increase the Glipizide to twice day and I am sending in more pills. Kidney function is still low, but not worse after starting the Meloxicam. Remind not to take any Ibuprofen, Advil or Aleve or other pain meds that have aspirin. Can take arthritis strength tylenol and the extended release works well.  Remember to drink at LEAST 2 liters = 64oz = 8 cups of water or non-caffeine beverages every day! Schedule a 2 month follow up visit to recheck. Thanks.

## 2024-01-27 NOTE — Addendum Note (Signed)
 Addended byDulce Sellar on: 01/27/2024 06:23 PM   Modules accepted: Orders

## 2024-01-28 NOTE — Progress Notes (Unsigned)
    Gary Wright D.[facility] [address] [place] [address] Phone: [phone]   Assessment and Plan:     There are no diagnoses linked to this encounter.  ***   Pertinent previous records reviewed include ***    Follow Up: ***     Subjective:   I, Gary Wright, am serving as a Neurosurgeon for Doctor Richardean Sale  Chief Complaint: hip pain   HPI:   01/29/2024 Patient is a 78 year old male with hip pain. Patient states   Relevant Historical Information: ***  Additional pertinent review of systems negative.   Current Outpatient Medications:    albuterol (PROVENTIL) (2.5 MG/3ML) 0.083% nebulizer solution, USE 1 VIAL VIA NEBULIZER FOUR TIMES DAILY AS NEEDED, Disp: 360 mL, Rfl: 2   albuterol (VENTOLIN HFA) 108 (90 Base) MCG/ACT inhaler, INHALE 2 PUFFS BY MOUTH EVERY 4 TO 6 HOURS AS NEEDED, Disp: 20.1 g, Rfl: 1   atorvastatin (LIPITOR) 40 MG tablet, TAKE 1 TABLET(40 MG) BY MOUTH DAILY AT 6 PM, Disp: 90 tablet, Rfl: 3   budesonide (PULMICORT) 0.5 MG/2ML nebulizer solution, Take 2 mLs (0.5 mg total) by nebulization 2 (two) times daily., Disp: 120 mL, Rfl: 11   buPROPion (WELLBUTRIN XL) 300 MG 24 hr tablet, Take 300 mg by mouth every morning., Disp: , Rfl: 1   citalopram (CELEXA) 40 MG tablet, Take 40 mg by mouth every morning., Disp: , Rfl: 0   cyclobenzaprine (FLEXERIL) 5 MG tablet, Take 1-2 tablets (5-10 mg total) by mouth 2 (two) times daily as needed for muscle spasms (Neck or hand pain. MAY cause drowsiness.)., Disp: 30 tablet, Rfl: 1   Dupilumab (DUPIXENT) 300 MG/2ML SOAJ, Inject 300 mg into the skin every 14 (fourteen) days., Disp: 12 mL, Rfl: 1   famotidine (PEPCID) 20 MG tablet, One after supper, Disp: 30 tablet, Rfl: 11   glipiZIDE (GLUCOTROL XL) 2.5 MG 24 hr tablet, Take 1 tablet (2.5 mg total) by mouth 2 (two) times daily after a meal., Disp: 60 tablet, Rfl: 2   ipratropium-albuterol (DUONEB) 0.5-2.5 (3) MG/3ML SOLN, Take 3  mLs by nebulization 4 (four) times daily., Disp: 360 mL, Rfl: 11   meloxicam (MOBIC) 15 MG tablet, Take 0.5 tablets (7.5 mg total) by mouth 2 (two) times daily. For neck and hand pain., Disp: 30 tablet, Rfl: 1   metoprolol succinate (TOPROL-XL) 25 MG 24 hr tablet, TAKE 1/2 TABLET(12.5 MG) BY MOUTH DAILY, Disp: 45 tablet, Rfl: 6   montelukast (SINGULAIR) 10 MG tablet, Take 10 mg by mouth daily., Disp: , Rfl:    olmesartan (BENICAR) 40 MG tablet, Take 1 tablet (40 mg total) by mouth daily., Disp: 30 tablet, Rfl: 11   pantoprazole (PROTONIX) 40 MG tablet, Take 1 tablet (40 mg total) by mouth daily. Take 30-60 min before first meal of the day, Disp: 30 tablet, Rfl: 2   zolpidem (AMBIEN) 5 MG tablet, Take 1 tablet (5 mg total) by mouth at bedtime as needed for sleep., Disp: 15 tablet, Rfl: 1   Objective:     There were no vitals filed for this visit.    There is no height or weight on file to calculate BMI.    Physical Exam:    ***   Electronically signed by:  Gary Wright D.[facility] 7:33 AM 01/28/24

## 2024-01-29 ENCOUNTER — Ambulatory Visit: Admitting: Sports Medicine

## 2024-01-29 ENCOUNTER — Telehealth: Payer: Self-pay

## 2024-01-29 ENCOUNTER — Ambulatory Visit (INDEPENDENT_AMBULATORY_CARE_PROVIDER_SITE_OTHER)

## 2024-01-29 VITALS — HR 100 | Ht 68.0 in | Wt 192.0 lb

## 2024-01-29 DIAGNOSIS — M25551 Pain in right hip: Secondary | ICD-10-CM

## 2024-01-29 DIAGNOSIS — M20022 Boutonniere deformity of left finger(s): Secondary | ICD-10-CM

## 2024-01-29 DIAGNOSIS — M160 Bilateral primary osteoarthritis of hip: Secondary | ICD-10-CM

## 2024-01-29 DIAGNOSIS — M25552 Pain in left hip: Secondary | ICD-10-CM

## 2024-01-29 DIAGNOSIS — M5136 Other intervertebral disc degeneration, lumbar region with discogenic back pain only: Secondary | ICD-10-CM

## 2024-01-29 DIAGNOSIS — G8929 Other chronic pain: Secondary | ICD-10-CM

## 2024-01-29 DIAGNOSIS — M20021 Boutonniere deformity of right finger(s): Secondary | ICD-10-CM

## 2024-01-29 DIAGNOSIS — M545 Low back pain, unspecified: Secondary | ICD-10-CM

## 2024-01-29 LAB — COMPREHENSIVE METABOLIC PANEL WITH GFR
ALT: 19 U/L (ref 0–53)
AST: 14 U/L (ref 0–37)
Albumin: 4.4 g/dL (ref 3.5–5.2)
Alkaline Phosphatase: 74 U/L (ref 39–117)
BUN: 29 mg/dL — ABNORMAL HIGH (ref 6–23)
CO2: 30 meq/L (ref 19–32)
Calcium: 9.6 mg/dL (ref 8.4–10.5)
Chloride: 100 meq/L (ref 96–112)
Creatinine, Ser: 1.46 mg/dL (ref 0.40–1.50)
GFR: 46.12 mL/min — ABNORMAL LOW (ref >60.00)
Glucose, Bld: 85 mg/dL (ref 70–99)
Potassium: 4.7 meq/L (ref 3.5–5.1)
Sodium: 139 meq/L (ref 135–145)
Total Bilirubin: 0.4 mg/dL (ref 0.2–1.2)
Total Protein: 6.7 g/dL (ref 6.0–8.3)

## 2024-01-29 LAB — CBC WITH DIFFERENTIAL/PLATELET
Basophils Absolute: 0 10*3/uL (ref 0.0–0.1)
Basophils Relative: 0.3 % (ref 0.0–3.0)
Eosinophils Absolute: 0.2 10*3/uL (ref 0.0–0.7)
Eosinophils Relative: 1.5 % (ref 0.0–5.0)
HCT: 43.4 % (ref 39.0–52.0)
Hemoglobin: 14.2 g/dL (ref 13.0–17.0)
Lymphocytes Relative: 25.8 % (ref 12.0–46.0)
Lymphs Abs: 3.6 10*3/uL (ref 0.7–4.0)
MCHC: 32.7 g/dL (ref 30.0–36.0)
MCV: 95.2 fl (ref 78.0–100.0)
Monocytes Absolute: 1 10*3/uL (ref 0.1–1.0)
Monocytes Relative: 7.4 % (ref 3.0–12.0)
Neutro Abs: 9.1 10*3/uL — ABNORMAL HIGH (ref 1.4–7.7)
Neutrophils Relative %: 65 % (ref 43.0–77.0)
Platelets: 281 10*3/uL (ref 150.0–400.0)
RBC: 4.56 Mil/uL (ref 4.22–5.81)
RDW: 14.2 % (ref 11.5–15.5)
WBC: 14 10*3/uL — ABNORMAL HIGH (ref 4.0–10.5)

## 2024-01-29 LAB — URIC ACID: Uric Acid, Serum: 5.4 mg/dL (ref 4.0–7.8)

## 2024-01-29 LAB — FERRITIN: Ferritin: 90.2 ng/mL (ref 22.0–322.0)

## 2024-01-29 LAB — TSH: TSH: 2.69 u[IU]/mL (ref 0.35–5.50)

## 2024-01-29 LAB — C-REACTIVE PROTEIN: CRP: <1 mg/dL (ref 0.5–20.0)

## 2024-01-29 LAB — VITAMIN D 25 HYDROXY (VIT D DEFICIENCY, FRACTURES): VITD: 24.41 ng/mL — ABNORMAL LOW (ref 30.00–100.00)

## 2024-01-29 LAB — SEDIMENTATION RATE: Sed Rate: 10 mm/h (ref 0–20)

## 2024-01-29 NOTE — Patient Instructions (Signed)
 Tylenol [phone] mg 2-3 times a day for pain relief  Hip HEP  PT referral  Labs on the way out  3-4 week follow up

## 2024-01-29 NOTE — Telephone Encounter (Signed)
 Copied from CRM [id number]. Topic: Clinical - Medical Advice >> Jan 29, 2024  3:13 PM Renie Ora wrote: Reason for CRM: Patient called in and stated the shots he is taking were supposed to be replaced as they were defected and patient is calling to check the status of when they will be replaced. Patient stated he takes the shots every 14 days  and he needs them to be replaced by Tuesday.  ATC X1. LMTCB

## 2024-01-30 ENCOUNTER — Other Ambulatory Visit (HOSPITAL_COMMUNITY): Payer: Self-pay

## 2024-01-30 ENCOUNTER — Other Ambulatory Visit: Payer: Self-pay

## 2024-01-30 NOTE — Telephone Encounter (Signed)
 Copied from CRM [id number]. Topic: Clinical - Medical Advice >> Jan 30, 2024 11:16 AM Gary Wright wrote: Patient is calling back to speak with the nurse regarding his shots - he missed a call from them yesterday and got the message to call back the office. I contacted the CAL and they advised me that the nurse would call him back when she's available. Patient advised and he will wait for a call back.   Pt calling again in regards to defective dupixent shots. Routing to pharm to advise again

## 2024-01-31 LAB — RHEUMATOID FACTOR: Rheumatoid fact SerPl-aCnc: <10 [IU]/mL (ref <14)

## 2024-01-31 LAB — CYCLIC CITRUL PEPTIDE ANTIBODY, IGG: Cyclic Citrullin Peptide Ab: <16 U

## 2024-02-01 ENCOUNTER — Other Ambulatory Visit: Payer: Self-pay | Admitting: Family

## 2024-02-01 DIAGNOSIS — M79641 Pain in right hand: Secondary | ICD-10-CM

## 2024-02-01 DIAGNOSIS — M542 Cervicalgia: Secondary | ICD-10-CM

## 2024-02-02 ENCOUNTER — Other Ambulatory Visit (HOSPITAL_COMMUNITY): Payer: Self-pay

## 2024-02-02 ENCOUNTER — Telehealth: Payer: Self-pay

## 2024-02-02 ENCOUNTER — Other Ambulatory Visit: Payer: Self-pay

## 2024-02-02 ENCOUNTER — Other Ambulatory Visit: Payer: Self-pay | Admitting: Family

## 2024-02-02 DIAGNOSIS — J4550 Severe persistent asthma, uncomplicated: Secondary | ICD-10-CM

## 2024-02-02 DIAGNOSIS — J4489 Other specified chronic obstructive pulmonary disease: Secondary | ICD-10-CM

## 2024-02-02 MED ORDER — DUPIXENT 300 MG/2ML ~~LOC~~ SOAJ
300.0000 mg | SUBCUTANEOUS | 1 refills | Status: DC
  Filled 2024-02-02: qty 4, 28d supply, fill #0
  Filled 2024-03-02: qty 4, 28d supply, fill #1
  Filled 2024-04-08: qty 4, 28d supply, fill #2

## 2024-02-02 NOTE — Telephone Encounter (Signed)
 Unfortunately we do not have Dupixent samples at this time but some should be on the way. Left VM for patient to stop by clinic on Wednesday afternoon or Friday morning to pick up sample  Please advise patient that we can provide Dupixent sample at [facility] location On Wednesday from 12:30-4:30p or Friday 8am-12pm. Thank you!  Geraldene Kleine, PharmD, MPH, BCPS, CPP Clinical Pharmacist (Rheumatology and Pulmonology)

## 2024-02-02 NOTE — Telephone Encounter (Signed)
 Unfortunately we do not have Dupixent samples at this time but some should be on the way. Left VM for patient to stop by clinic on Wednesday afternoon or Friday morning to pick up sample  Gary Wright, PharmD, MPH, BCPS, CPP Clinical Pharmacist (Rheumatology and Pulmonology)

## 2024-02-02 NOTE — Telephone Encounter (Signed)
 Copied from CRM [id number]. Topic: Clinical - Prescription Issue >> Jan 30, 2024  3:10 PM Margarette Shawl wrote: Reason for CRM:   Patient is calling in concerns to his Dupixent prescription. He reports receiving a defective auto injector pen and had contacted the manufacturer. He was advised a new pen would be sent to him; however, he has not received it yet. He is due for his next injection on 04/15.   CB#  [phone]

## 2024-02-02 NOTE — Telephone Encounter (Signed)
 Copied from CRM [id number]. Topic: Clinical - Medication Refill >> Feb 02, 2024 11:58 AM Juliana Ocean wrote: Most Recent Primary Care Visit:  Provider: Versa Gore  Department: [facility]  Visit Type: OFFICE VISIT  Date: 01/26/2024  Medication:  Dupilumab (DUPIXENT) 300 MG/2ML SOAJ  Has the patient contacted their pharmacy? Yes (Pt is getting this med through a grant.  The shot was defective.  Pt reached out to company, and has not heard anything back as yet.  Pt is supposed to take the shot tomorrow  Is this the correct pharmacy for this prescription? No This is delivered to his home    Has the prescription been filled recently? No  Is the patient out of the medication? Yes  Has the patient been seen for an appointment in the last year OR does the patient have an upcoming appointment? Yes  Can we respond through MyChart? No  Agent: Please be advised that Rx refills may take up to 3 business days. We ask that you follow-up with your pharmacy.

## 2024-02-03 ENCOUNTER — Ambulatory Visit: Payer: Self-pay

## 2024-02-03 NOTE — Telephone Encounter (Signed)
 Copied from CRM [id number]. Topic: Clinical - Red Word Triage >> Feb 03, 2024  2:44 PM Denese Killings wrote: Red Word that prompted transfer to Nurse Triage: Patient has severe arthritis pain in both hips and rash on ankle.  Chief Complaint: body pain & rash Symptoms: bilateral hip pain and  rash on left ankle Frequency: ongoing but worsening last couple of weeks Pertinent Negatives: Patient denies fever Disposition: [] ED /[] Urgent Care (no appt availability in office) / [x] Appointment(In office/virtual)/ []  [place] [facility]/ [] Home Care/ [] Refused Recommended Disposition /[] [facility]/ []  Follow-up with PCP Additional Notes: pt takes tylenol 10000 mg every 6 hours for hip pain.  Pt has treated benadryl and other ointments to help with rash & itching without relief.,  Reason for Disposition  Localized rash present > 7 days  [1] MODERATE pain (e.g., interferes with normal activities) AND [2] present > 3 days  Answer Assessment - Initial Assessment Questions 1. ONSET: "When did the muscle aches or body pains start?"      Ongoing and worsening last 2 weeks 2. LOCATION: "What part of your body is hurting?" (e.g., entire body, arms, legs)      Bilateral hip pain 3. SEVERITY: "How bad is the pain?" (Scale 1-10; or mild, moderate, severe)   - MILD (1-3): doesn't interfere with normal activities    - MODERATE (4-7): interferes with normal activities or awakens from sleep    - SEVERE (8-10):  excruciating pain, unable to do any normal activities      8/10 4. CAUSE: "What do you think is causing the pains?"     arthritis 5. FEVER: "Have you been having fever?"     no 6. OTHER SYMPTOMS: "Do you have any other symptoms?" (e.g., chest pain, weakness, rash, cold or flu symptoms, weight loss)     Weakness  7. PREGNANCY: "Is there any chance you are pregnant?" "When was your last menstrual period?"     N/a 8. TRAVEL: "Have you traveled out of the country in the last month?" (e.g.,  travel history, exposures)     N/a  Answer Assessment - Initial Assessment Questions 1. APPEARANCE of RASH: "Describe the rash."     Red, rash 2. LOCATION: "Where is the rash located?"      Left ankle 3. NUMBER: "How many spots are there?"      1 large patch 4. SIZE: "How big are the spots?" (Inches, centimeters or compare to size of a coin)      N/a 5. ONSET: "When did the rash start?"      Ongoing but flares up and gets then gets better. 6. ITCHING: "Does the rash itch?" If Yes, ask: "How bad is the itch?"  (Scale 0-10; or none, mild, moderate, severe)     Moderate to severe 7. PAIN: "Does the rash hurt?" If Yes, ask: "How bad is the pain?"  (Scale 0-10; or none, mild, moderate, severe)    - NONE (0): no pain    - MILD (1-3): doesn't interfere with normal activities     - MODERATE (4-7): interferes with normal activities or awakens from sleep     - SEVERE (8-10): excruciating pain, unable to do any normal activities     no 8. OTHER SYMPTOMS: "Do you have any other symptoms?" (e.g., fever)     no 9. PREGNANCY: "Is there any chance you are pregnant?" "When was your last menstrual period?"     N/a  Protocols used: Muscle Aches and Body  Pain-A-AH, Rash or Redness - Localized-A-AH

## 2024-02-03 NOTE — Telephone Encounter (Signed)
 Noted.

## 2024-02-03 NOTE — Telephone Encounter (Signed)
 Copied from CRM [id number]. Topic: Clinical - Medication Question >> Feb 03, 2024  9:51 AM Chantha C wrote: Reason for CRM: Patient would like to speak with pharamacist on Dupilumab (DUPIXENT) 300 MG/2ML SOAJ on how to take it. Please advise and call back [phone].  Routing to [employer] team

## 2024-02-04 LAB — ANA+ENA+DNA/DS+SCL 70+SJOSSA/B
ANA Titer 1: NEGATIVE
ENA RNP Ab: <0.2 AI (ref 0.0–0.9)
ENA SM Ab Ser-aCnc: <0.2 AI (ref 0.0–0.9)
ENA SSA (RO) Ab: <0.2 AI (ref 0.0–0.9)
ENA SSB (LA) Ab: <0.2 AI (ref 0.0–0.9)
Scleroderma (Scl-70) (ENA) Antibody, IgG: <0.2 AI (ref 0.0–0.9)
dsDNA Ab: <1 [IU]/mL (ref 0–9)

## 2024-02-05 ENCOUNTER — Other Ambulatory Visit (HOSPITAL_COMMUNITY): Payer: Self-pay

## 2024-02-05 ENCOUNTER — Other Ambulatory Visit: Payer: Self-pay

## 2024-02-05 ENCOUNTER — Encounter: Payer: Self-pay | Admitting: Sports Medicine

## 2024-02-05 ENCOUNTER — Other Ambulatory Visit: Payer: Self-pay | Admitting: Pharmacy Technician

## 2024-02-05 ENCOUNTER — Ambulatory Visit (INDEPENDENT_AMBULATORY_CARE_PROVIDER_SITE_OTHER): Admitting: Family

## 2024-02-05 ENCOUNTER — Encounter: Payer: Self-pay | Admitting: Family

## 2024-02-05 ENCOUNTER — Telehealth: Payer: Self-pay

## 2024-02-05 VITALS — BP 113/75 | HR 98 | Temp 98.4°F | Ht 68.0 in | Wt 191.0 lb

## 2024-02-05 DIAGNOSIS — M25552 Pain in left hip: Secondary | ICD-10-CM | POA: Diagnosis not present

## 2024-02-05 DIAGNOSIS — M25551 Pain in right hip: Secondary | ICD-10-CM

## 2024-02-05 DIAGNOSIS — G8929 Other chronic pain: Secondary | ICD-10-CM | POA: Diagnosis not present

## 2024-02-05 DIAGNOSIS — B369 Superficial mycosis, unspecified: Secondary | ICD-10-CM

## 2024-02-05 MED ORDER — CLOTRIMAZOLE-BETAMETHASONE 1-0.05 % EX CREA
1.0000 | TOPICAL_CREAM | Freq: Two times a day (BID) | CUTANEOUS | 2 refills | Status: DC
Start: 2024-02-05 — End: 2024-08-18

## 2024-02-05 NOTE — Progress Notes (Signed)
 Specialty Pharmacy Refill Coordination Note  Gary Wright is a 78 y.o. male contacted today regarding refills of specialty medication(s) Dupilumab (Dupixent)   Patient requested Delivery   Delivery date: 02/04/24   Verified address: [address] [place] [address]   Medication will be filled on 02/03/24.

## 2024-02-05 NOTE — Telephone Encounter (Signed)
 Copied from CRM [id number]. Topic: Clinical - Prescription Issue >> Feb 05, 2024  9:33 AM Crist Dominion wrote: Reason for CRM: Patient is requesting a call back from a member of the pharmacy or a nurse regarding his missing dupixent shot.  ATC x1 LVM for patient to call our office back regarding prior message .

## 2024-02-05 NOTE — Progress Notes (Signed)
 Clinical Intervention Note  Clinical Intervention Notes: Per chart and paitent statement he had a Dupixent device misfire recently. He could not be clear when this occurred as he stated that his last dose about 2 weeks ago was not an issue. We then sent another box as insurance did cover it while he was waiting for his replacement product from manufacturer. He attempted another injection last night and had a similar device misfire as the previous time whereas he inserted the needle, pressed down, thought that dose was administered, removed needle to find medication dripping out then quickly re-inserted the needle. He is unsure if he actually received some of the dose. Advised patient to continue to pursue replacement product with manufacturer for the now 2 misfired devices and to contact provider office for possible additional training on self injection, switch to injection in office, or consideration of prefilled syringe instead if deemed appropriate. Informed [facility] as well.   Clinical Intervention Outcomes: Improved therapy adherence   Rena Carnes Specialty Pharmacist

## 2024-02-05 NOTE — Progress Notes (Signed)
 Patient ID: Gary Wright, male    DOB: 01/23/1946, 78 y.o.   MRN: [id number]  Chief Complaint  Patient presents with   Rash    Pt c/o rash on left ankle, Present 4 days off and on. Has tried alcohol.   Hip Pain    Pt c/o arthritis in bilateral hips.   Discussed the use of AI scribe software for clinical note transcription with the patient, who gave verbal consent to proceed.  History of Present Illness The patient, with a history of arthritis and a rash, presents after a recent visit to a sports medicine clinic. He reports dissatisfaction with the visit, stating he did not receive the expected joint injection for his arthritis. Instead, he was given exercises to do at home and advised to take Tylenol. The patient was also recommended to attend physical therapy three times a week, which he declined due to the cost. He expresses frustration with the level of pain he is experiencing and feels that it is not being adequately addressed.  The patient also reports issues with his medication injections, stating he has missed one and is about to miss another due to difficulties with the manufacturer. He expresses frustration with the time spent on the phone trying to resolve the issue.  In addition to his joint pain, the patient has a rash on his ankle. He has been applying alcohol to it, which he reports has cleared it up. However, he has also been prescribed a cream (clotrimazole and betamethasone) which he states used to help but did not provide much relief recently.  Assessment & Plan Bilateral hand pain/Rheumatoid Arthritis Told by recent ortho visit his hands appeared as RA, but not confirmed by lab analysis, ANA & RA factor negative.  - Continue Tylenol 650mg  CR 2-3x/day and Mobic 7.5mg  bid for pain. - Discussed consulting with a Rheumatology specialist if needed as they sometimes will still see patients regardless of positive lab work.  Pt to consider.  Chronic hip pain  Significant pain  and dissatisfaction with current management and not receiving a joint injection. Due to chronic lung condition he has been told he could not undergo surgery and concerned about physical therapy costs. - Continue Tylenol 650mg  CR 2-3x/day and Mobic 7.5mg  bid for pain. - Encouraged pt to attend at least one physical therapy session to learn how to do the exercises correctly. - Advised to call current sports med provider to see if can get back in sooner than a month to try and get joint injection. - Discussed consulting another orthopedic specialist if needed.  Rash on Ankle Recurrent rash treated with clotrimazole and betamethasone cream. Alcohol use for relief not recommended. - Refill clotrimazole and betamethasone cream, apply twice daily. - Advise against alcohol use on rash.    Subjective:    Outpatient Medications Prior to Visit  Medication Sig Dispense Refill   albuterol (PROVENTIL) (2.5 MG/3ML) 0.083% nebulizer solution USE 1 VIAL VIA NEBULIZER FOUR TIMES DAILY AS NEEDED 360 mL 2   albuterol (VENTOLIN HFA) 108 (90 Base) MCG/ACT inhaler INHALE 2 PUFFS BY MOUTH EVERY 4 TO 6 HOURS AS NEEDED 20.1 g 1   atorvastatin (LIPITOR) 40 MG tablet TAKE 1 TABLET(40 MG) BY MOUTH DAILY AT 6 PM 90 tablet 3   budesonide (PULMICORT) 0.5 MG/2ML nebulizer solution Take 2 mLs (0.5 mg total) by nebulization 2 (two) times daily. 120 mL 11   buPROPion (WELLBUTRIN XL) 300 MG 24 hr tablet Take 300 mg by mouth every morning.  1   citalopram (CELEXA) 40 MG tablet Take 40 mg by mouth every morning.  0   cyclobenzaprine (FLEXERIL) 5 MG tablet TAKE 1 TO 2 TABLETS(5 TO 10 MG) BY MOUTH NECK TWICE DAILY AS NEEDED FOR MUSCLE SPASMS OR HAND PAIN. MAY CAUSE DROWSINESS 30 tablet 1   Dupilumab (DUPIXENT) 300 MG/2ML SOAJ Inject 300 mg into the skin every 14 (fourteen) days. 12 mL 1   famotidine (PEPCID) 20 MG tablet One after supper 30 tablet 11   glipiZIDE (GLUCOTROL XL) 2.5 MG 24 hr tablet Take 1 tablet (2.5 mg total) by  mouth 2 (two) times daily after a meal. 60 tablet 2   ipratropium-albuterol (DUONEB) 0.5-2.5 (3) MG/3ML SOLN Take 3 mLs by nebulization 4 (four) times daily. 360 mL 11   meloxicam (MOBIC) 15 MG tablet Take 0.5 tablets (7.5 mg total) by mouth 2 (two) times daily. For neck and hand pain. 30 tablet 1   metoprolol succinate (TOPROL-XL) 25 MG 24 hr tablet TAKE 1/2 TABLET(12.5 MG) BY MOUTH DAILY 45 tablet 6   montelukast (SINGULAIR) 10 MG tablet Take 10 mg by mouth daily.     olmesartan (BENICAR) 40 MG tablet Take 1 tablet (40 mg total) by mouth daily. 30 tablet 11   pantoprazole (PROTONIX) 40 MG tablet Take 1 tablet (40 mg total) by mouth daily. Take 30-60 min before first meal of the day 30 tablet 2   zolpidem (AMBIEN) 5 MG tablet Take 1 tablet (5 mg total) by mouth at bedtime as needed for sleep. 15 tablet 1   No facility-administered medications prior to visit.   Past Medical History:  Diagnosis Date   Acute dyspnea 03/24/2022   Acute exacerbation of chronic obstructive pulmonary disease (COPD) (HCC) 03/25/2022   CAD (coronary artery disease) of artery bypass graft 06/13/2016   Chest pain    COPD (chronic obstructive pulmonary disease) (HCC)    COPD with acute exacerbation (HCC) 11/02/2020   Quit smokig 2019  - PFT's  121/22/23  FEV1 1.83 (60 % ) ratio 0.57  p 15 % improvement from saba p 0 prior to study with DLCO  16.4 (65%)   and FV curve mildly concave  - try off acei and on otc gerd rx 06/02/2023 >>>       Depression 09/01/2007   Qualifier: Diagnosis of  By: Sharlette Dayhoff CMA, [place]     Elevated troponin 06/13/2016   History of lung cancer 11/02/2020   Hyperlipidemia 11/02/2020   Hypertension    Prolonged QT interval 11/02/2020   Stress-induced cardiomyopathy 06/13/2016   TOBACCO ABUSE 09/01/2007   Qualifier: Diagnosis of   By: Sharlette Dayhoff CMA, [place]         Past Surgical History:  Procedure Laterality Date   BACK SURGERY     fusion in 1973   CARDIAC CATHETERIZATION N/A 06/13/2016    Procedure: Left Heart Cath and Coronary Angiography;  Surgeon: Sammy Crisp, MD;  Location: [facility] INVASIVE CV LAB;  Service: Cardiovascular;  Laterality: N/A;   INGUINAL HERNIA REPAIR Right 04/21/2018   Procedure: RIGHT INGUINAL HERNIA REPAIR ERAS PATHWAY;  Surgeon: Sim Dryer, MD;  Location: [facility] OR;  Service: General;  Laterality: Right;  TAP BLOCK   INSERTION OF MESH Right 04/21/2018   Procedure: INSERTION OF MESH;  Surgeon: Sim Dryer, MD;  Location: [facility] OR;  Service: General;  Laterality: Right;   TONSILLECTOMY     No Known Allergies    Objective:    Physical Exam Vitals and nursing note reviewed.  Constitutional:  General: He is not in acute distress.    Appearance: Normal appearance.  HENT:     Head: Normocephalic.  Cardiovascular:     Rate and Rhythm: Normal rate and regular rhythm.  Pulmonary:     Effort: Pulmonary effort is normal.     Breath sounds: Normal breath sounds.  Musculoskeletal:        General: Normal range of motion.     Right hand: Swelling (mild) and bony tenderness present. Decreased strength.     Left hand: Swelling (mild) and bony tenderness present. Decreased strength.     Cervical back: Normal range of motion. No edema, erythema or signs of trauma. Pain with movement present.  Skin:    General: Skin is warm and dry.  Neurological:     Mental Status: He is alert and oriented to person, place, and time.  Psychiatric:        Mood and Affect: Mood normal.    BP 113/75 (BP Location: Left Arm, Patient Position: Sitting, Cuff Size: Large)   Pulse 98   Temp 98.4 F (36.9 C) (Temporal)   Ht 5\' 8"  (1.727 m)   Wt 191 lb (86.6 kg)   SpO2 99%   BMI 29.04 kg/m  Wt Readings from Last 3 Encounters:  02/05/24 191 lb (86.6 kg)  01/29/24 192 lb (87.1 kg)  01/26/24 192 lb 6 oz (87.3 kg)      Versa Gore, NP

## 2024-02-09 ENCOUNTER — Telehealth: Payer: Self-pay

## 2024-02-09 ENCOUNTER — Telehealth: Payer: Self-pay | Admitting: Sports Medicine

## 2024-02-09 ENCOUNTER — Other Ambulatory Visit: Payer: Self-pay | Admitting: Sports Medicine

## 2024-02-09 MED ORDER — METHYLPREDNISOLONE 4 MG PO TBPK: ORAL_TABLET | ORAL | 0 refills | Status: DC

## 2024-02-09 NOTE — Telephone Encounter (Signed)
 Copied from CRM [id number]. Topic: Clinical - Medication Question >> Feb 06, 2024 10:29 AM Luane Rumps D wrote: Reason for CRM: Patient requesting Versa Gore, NP give him a prescription of prednisone  for relief in the meantime until his sports medicine appointment on 04/23   Please advise

## 2024-02-09 NOTE — Telephone Encounter (Signed)
 Prednisone  dos pak placed

## 2024-02-09 NOTE — Telephone Encounter (Signed)
 Patient called after hours and asked if Dr Cleora Daft could send in Prednisone  for him to help with is hip pain? He said that is is hard to walk.

## 2024-02-10 ENCOUNTER — Telehealth: Payer: Self-pay | Admitting: Sports Medicine

## 2024-02-10 NOTE — Telephone Encounter (Signed)
 Earliest current appointment available in the office is Monday. Is this okay?

## 2024-02-10 NOTE — Telephone Encounter (Signed)
 I returned pt's call, Pt verbalized understanding.

## 2024-02-10 NOTE — Telephone Encounter (Signed)
 Called and rescheduled.

## 2024-02-10 NOTE — Telephone Encounter (Signed)
 no, they will not give him an injection if he already has steroid in his body. Thx

## 2024-02-10 NOTE — Telephone Encounter (Signed)
 Received message from [facility] that patient had administration issues with another Dupixent  dose. Called patient to discuss a re-training session or switching to prefilled syringe. He states that he thinks "he's got it from here" but will call clinic if he has persistent administration issues.  Geraldene Kleine, PharmD, MPH, BCPS, CPP Clinical Pharmacist (Rheumatology and Pulmonology)

## 2024-02-10 NOTE — Telephone Encounter (Signed)
 Patient called stating that he has been having leg numbness in his right leg for about 4 or 5 days. This is new. He is scheduled to start PT tomorrow. He asked if this is something he should still do? He was concerned about the new symptoms.

## 2024-02-11 ENCOUNTER — Ambulatory Visit: Admitting: Physical Therapy

## 2024-02-11 NOTE — Progress Notes (Unsigned)
 Ben Jackson D.[facility] [address] [place] [address] Phone: [phone]   Assessment and Plan:     There are no diagnoses linked to this encounter.  ***   Pertinent previous records reviewed include ***    Follow Up: ***     Subjective:   I, Charitie Hinote, am serving as a Neurosurgeon for Doctor Ulysees Gander   Chief Complaint: hip pain    HPI:    01/29/2024 Patient is a 78 year old male with hip pain. Patient states that he has had pain for over a year. Hip pain has increased low back pain that radiates down to the hips. Walks with a cane. Prednisone  helped some with his hands but not his hip. No meds for the pain. No numbness or tingling. He has decreased ROM. When he turns his right leg in he has hip pain. Hx of back surgery.    02/12/2024 Patient states   Relevant Historical Information: Hypertension, COPD, drug-induced DM, CKD stage IIIb  Additional pertinent review of systems negative.   Current Outpatient Medications:    albuterol  (PROVENTIL ) (2.5 MG/3ML) 0.083% nebulizer solution, USE 1 VIAL VIA NEBULIZER FOUR TIMES DAILY AS NEEDED, Disp: 360 mL, Rfl: 2   albuterol  (VENTOLIN  HFA) 108 (90 Base) MCG/ACT inhaler, INHALE 2 PUFFS BY MOUTH EVERY 4 TO 6 HOURS AS NEEDED, Disp: 20.1 g, Rfl: 1   atorvastatin  (LIPITOR) 40 MG tablet, TAKE 1 TABLET(40 MG) BY MOUTH DAILY AT 6 PM, Disp: 90 tablet, Rfl: 3   budesonide  (PULMICORT ) 0.5 MG/2ML nebulizer solution, Take 2 mLs (0.5 mg total) by nebulization 2 (two) times daily., Disp: 120 mL, Rfl: 11   buPROPion  (WELLBUTRIN  XL) 300 MG 24 hr tablet, Take 300 mg by mouth every morning., Disp: , Rfl: 1   citalopram  (CELEXA ) 40 MG tablet, Take 40 mg by mouth every morning., Disp: , Rfl: 0   clotrimazole -betamethasone  (LOTRISONE ) cream, Apply 1 Application topically 2 (two) times daily. Apply to buttocks and left ankle & heel., Disp: 45 g, Rfl: 2   cyclobenzaprine  (FLEXERIL ) 5 MG tablet, TAKE 1 TO  2 TABLETS(5 TO 10 MG) BY MOUTH NECK TWICE DAILY AS NEEDED FOR MUSCLE SPASMS OR HAND PAIN. MAY CAUSE DROWSINESS, Disp: 30 tablet, Rfl: 1   Dupilumab  (DUPIXENT ) 300 MG/2ML SOAJ, Inject 300 mg into the skin every 14 (fourteen) days., Disp: 12 mL, Rfl: 1   famotidine  (PEPCID ) 20 MG tablet, One after supper, Disp: 30 tablet, Rfl: 11   glipiZIDE  (GLUCOTROL  XL) 2.5 MG 24 hr tablet, Take 1 tablet (2.5 mg total) by mouth 2 (two) times daily after a meal., Disp: 60 tablet, Rfl: 2   ipratropium-albuterol  (DUONEB) 0.5-2.5 (3) MG/3ML SOLN, Take 3 mLs by nebulization 4 (four) times daily., Disp: 360 mL, Rfl: 11   meloxicam  (MOBIC ) 15 MG tablet, Take 0.5 tablets (7.5 mg total) by mouth 2 (two) times daily. For neck and hand pain., Disp: 30 tablet, Rfl: 1   methylPREDNISolone  (MEDROL  DOSEPAK) 4 MG TBPK tablet, Take 6 tablets on day 1.  Take 5 tablets on day 2.  Take 4 tablets on day 3.  Take 3 tablets on day 4.  Take 2 tablets on day 5.  Take 1 tablet on day 6., Disp: 21 tablet, Rfl: 0   metoprolol  succinate (TOPROL -XL) 25 MG 24 hr tablet, TAKE 1/2 TABLET(12.5 MG) BY MOUTH DAILY, Disp: 45 tablet, Rfl: 6   montelukast  (SINGULAIR ) 10 MG tablet, Take 10 mg by mouth daily., Disp: ,  Rfl:    olmesartan  (BENICAR ) 40 MG tablet, Take 1 tablet (40 mg total) by mouth daily., Disp: 30 tablet, Rfl: 11   pantoprazole  (PROTONIX ) 40 MG tablet, Take 1 tablet (40 mg total) by mouth daily. Take 30-60 min before first meal of the day, Disp: 30 tablet, Rfl: 2   zolpidem  (AMBIEN ) 5 MG tablet, Take 1 tablet (5 mg total) by mouth at bedtime as needed for sleep., Disp: 15 tablet, Rfl: 1   Objective:     There were no vitals filed for this visit.    There is no height or weight on file to calculate BMI.    Physical Exam:    ***   Electronically signed by:  Marshall Skeeter D[facility] 7:52 AM 02/11/24

## 2024-02-12 ENCOUNTER — Ambulatory Visit: Admitting: Sports Medicine

## 2024-02-12 ENCOUNTER — Other Ambulatory Visit: Payer: Self-pay

## 2024-02-12 ENCOUNTER — Ambulatory Visit (INDEPENDENT_AMBULATORY_CARE_PROVIDER_SITE_OTHER)

## 2024-02-12 VITALS — HR 118 | Ht 68.0 in | Wt 194.0 lb

## 2024-02-12 DIAGNOSIS — M25552 Pain in left hip: Secondary | ICD-10-CM | POA: Diagnosis not present

## 2024-02-12 DIAGNOSIS — M160 Bilateral primary osteoarthritis of hip: Secondary | ICD-10-CM

## 2024-02-12 DIAGNOSIS — M25551 Pain in right hip: Secondary | ICD-10-CM

## 2024-02-12 DIAGNOSIS — M51362 Other intervertebral disc degeneration, lumbar region with discogenic back pain and lower extremity pain: Secondary | ICD-10-CM

## 2024-02-12 DIAGNOSIS — M545 Low back pain, unspecified: Secondary | ICD-10-CM | POA: Diagnosis not present

## 2024-02-12 DIAGNOSIS — G8929 Other chronic pain: Secondary | ICD-10-CM | POA: Diagnosis not present

## 2024-02-12 NOTE — Patient Instructions (Signed)
 Tylenol  [phone] mg 2-3 times a day for pain relief  MRI referral  1 week follow up after MRI to discuss results

## 2024-02-16 ENCOUNTER — Ambulatory Visit: Payer: Medicare Other | Admitting: Pulmonary Disease

## 2024-02-16 ENCOUNTER — Telehealth: Payer: Self-pay

## 2024-02-16 ENCOUNTER — Encounter: Payer: Self-pay | Admitting: Sports Medicine

## 2024-02-16 ENCOUNTER — Ambulatory Visit: Admitting: Sports Medicine

## 2024-02-16 NOTE — Telephone Encounter (Unsigned)
 Copied from CRM [id number]. Topic: Clinical - Medication Question >> Feb 16, 2024  9:48 AM Margarette Shawl wrote: Reason for CRM:   Patient is wanting pharmacist to walk him through how to use the Dupixent  injection to make sure he is doing it correctly. He is starting his first injection on 02/17/2023.   CB#  [phone]

## 2024-02-16 NOTE — Telephone Encounter (Signed)
 ATC #1 to schedule Dupixent  retaining. Unable to reach. Left VM   Please get patient scheduled with pharmacist for retraining on Dupixent . We have a sample in the clinic that we will use. Please tell him not to bring medication with him. Thanks

## 2024-02-16 NOTE — Progress Notes (Unsigned)
 HPI Patient presents today to  Pulmonary to see pharmacy team for Dupixent  injection retraining. Has had a few failed self-administration attempts in the past month  Respiratory Medications Current regimen: *** Tried in past: *** Patient reports {Adherence challenges yes no:3044014::"adherence challenges","no known adherence challenges"}  OBJECTIVE No Known Allergies  Outpatient Encounter Medications as of 02/17/2024  Medication Sig   albuterol  (PROVENTIL ) (2.5 MG/3ML) 0.083% nebulizer solution USE 1 VIAL VIA NEBULIZER FOUR TIMES DAILY AS NEEDED   albuterol  (VENTOLIN  HFA) 108 (90 Base) MCG/ACT inhaler INHALE 2 PUFFS BY MOUTH EVERY 4 TO 6 HOURS AS NEEDED   atorvastatin  (LIPITOR) 40 MG tablet TAKE 1 TABLET(40 MG) BY MOUTH DAILY AT 6 PM   budesonide  (PULMICORT ) 0.5 MG/2ML nebulizer solution Take 2 mLs (0.5 mg total) by nebulization 2 (two) times daily.   buPROPion  (WELLBUTRIN  XL) 300 MG 24 hr tablet Take 300 mg by mouth every morning.   citalopram  (CELEXA ) 40 MG tablet Take 40 mg by mouth every morning.   clotrimazole -betamethasone  (LOTRISONE ) cream Apply 1 Application topically 2 (two) times daily. Apply to buttocks and left ankle & heel.   cyclobenzaprine  (FLEXERIL ) 5 MG tablet TAKE 1 TO 2 TABLETS(5 TO 10 MG) BY MOUTH NECK TWICE DAILY AS NEEDED FOR MUSCLE SPASMS OR HAND PAIN. MAY CAUSE DROWSINESS   Dupilumab  (DUPIXENT ) 300 MG/2ML SOAJ Inject 300 mg into the skin every 14 (fourteen) days.   famotidine  (PEPCID ) 20 MG tablet One after supper   glipiZIDE  (GLUCOTROL  XL) 2.5 MG 24 hr tablet Take 1 tablet (2.5 mg total) by mouth 2 (two) times daily after a meal.   ipratropium-albuterol  (DUONEB) 0.5-2.5 (3) MG/3ML SOLN Take 3 mLs by nebulization 4 (four) times daily.   meloxicam  (MOBIC ) 15 MG tablet Take 0.5 tablets (7.5 mg total) by mouth 2 (two) times daily. For neck and hand pain.   methylPREDNISolone  (MEDROL  DOSEPAK) 4 MG TBPK tablet Take 6 tablets on day 1.  Take 5 tablets on day 2.   Take 4 tablets on day 3.  Take 3 tablets on day 4.  Take 2 tablets on day 5.  Take 1 tablet on day 6.   metoprolol  succinate (TOPROL -XL) 25 MG 24 hr tablet TAKE 1/2 TABLET(12.5 MG) BY MOUTH DAILY   montelukast  (SINGULAIR ) 10 MG tablet Take 10 mg by mouth daily.   olmesartan  (BENICAR ) 40 MG tablet Take 1 tablet (40 mg total) by mouth daily.   pantoprazole  (PROTONIX ) 40 MG tablet Take 1 tablet (40 mg total) by mouth daily. Take 30-60 min before first meal of the day   zolpidem  (AMBIEN ) 5 MG tablet Take 1 tablet (5 mg total) by mouth at bedtime as needed for sleep.   No facility-administered encounter medications on file as of 02/17/2024.     Immunization History  Administered Date(s) Administered   Fluad Quad(high Dose 65+) 08/14/2023   Influenza, High Dose Seasonal PF 07/21/2018   Influenza-Unspecified 07/10/2022   PFIZER(Purple Top)SARS-COV-2 Vaccination 12/31/2019, 01/24/2020   Pneumococcal Polysaccharide-23 06/14/2016   Pneumococcal-Unspecified 09/10/2022   Respiratory Syncytial Virus Vaccine,Recomb Aduvanted(Arexvy) 09/10/2022     PFTs    Latest Ref Rng & Units 09/11/2022    9:47 AM  PFT Results  FVC-Pre L 2.96   FVC-Predicted Pre % 70   FVC-Post L 3.22   FVC-Predicted Post % 76   Pre FEV1/FVC % % 54   Post FEV1/FCV % % 57   FEV1-Pre L 1.58   FEV1-Predicted Pre % 52   FEV1-Post L 1.83   DLCO uncorrected  ml/min/mmHg 16.42   DLCO UNC% % 65   DLCO corrected ml/min/mmHg 16.42   DLCO COR %Predicted % 65   DLVA Predicted % 78   TLC L 6.78   TLC % Predicted % 96   RV % Predicted % 132      Eosinophils Most recent blood eosinophil count was *** cells/microL taken on ***.   IgE: *** on ***   Assessment   Biologics training for dupilumab  (Dupixent )  Dose: 300mg  every 14 days  Access: Approval of Dupixent  through: {specialtycoverage:25706} Patient enrolled into copay card program to help with copay assistance.  Patient self-administered Dupixent  300mg /15ml x 1 in  {injsitedsg:28167} using sample Dupixent  300mg /42mL autoinjector pen NDC: *** Lot: *** Expiration: ***  Patient monitored for 30 minutes for adverse reaction.  Patient tolerated ***.  Injection site noted. {injectionreaction:30756}   PLAN Continue Dupixent  300mg  every 14 days.  Next dose is due *** and every 14 days thereafter. Rx sent to: {SpecialtyPharmacies:25705}.  Patient provided with pharmacy phone number and advised to call later this week to schedule shipment to home. Continue maintenance inhaler regimen of: ***  All questions encouraged and answered.  Instructed patient to reach out with any further questions or concerns.  Thank you for allowing pharmacy to participate in this patient's care.  This appointment required *** minutes of patient care (this includes precharting, chart review, review of results, face-to-face care, etc.).

## 2024-02-17 ENCOUNTER — Other Ambulatory Visit: Payer: Self-pay | Admitting: Pharmacist

## 2024-02-17 ENCOUNTER — Ambulatory Visit: Admitting: Pharmacist

## 2024-02-17 DIAGNOSIS — Z7189 Other specified counseling: Secondary | ICD-10-CM

## 2024-02-17 NOTE — Telephone Encounter (Signed)
 Patient is scheduled for 4/29. Nothing further needed.

## 2024-02-17 NOTE — Progress Notes (Signed)
 Patient retrained on Dupixent  injection technique today. Patient completed injection in left thigh without any issue and minimal discomfort. He will plan to administer in thighs moving forward and will alternate injection sites  Gary Wright, PharmD, MPH, BCPS, CPP Clinical Pharmacist (Rheumatology and Pulmonology)

## 2024-02-18 ENCOUNTER — Encounter: Payer: Self-pay | Admitting: Sports Medicine

## 2024-02-19 ENCOUNTER — Ambulatory Visit: Admitting: Sports Medicine

## 2024-02-20 ENCOUNTER — Other Ambulatory Visit (HOSPITAL_COMMUNITY): Payer: Self-pay

## 2024-02-27 ENCOUNTER — Other Ambulatory Visit (HOSPITAL_COMMUNITY): Payer: Self-pay

## 2024-03-02 ENCOUNTER — Other Ambulatory Visit: Payer: Self-pay

## 2024-03-02 NOTE — Progress Notes (Signed)
 Specialty Pharmacy Refill Coordination Note  Gary Wright is a 78 y.o. male contacted today regarding refills of specialty medication(s) Dupilumab  (Dupixent )   Patient requested Delivery   Delivery date: 03/11/24   Verified address: [address] [place] [address]   Medication will be filled on 05.21.25.

## 2024-03-04 ENCOUNTER — Ambulatory Visit
Admission: RE | Admit: 2024-03-04 | Discharge: 2024-03-04 | Disposition: A | Discharge status: Home / Self Care | Source: Ambulatory Visit | Attending: Sports Medicine

## 2024-03-04 DIAGNOSIS — G8929 Other chronic pain: Secondary | ICD-10-CM

## 2024-03-10 ENCOUNTER — Other Ambulatory Visit: Payer: Self-pay

## 2024-03-10 NOTE — Progress Notes (Deleted)
 Ben Jackson D.[facility] [address] [place] [address] Phone: [phone]   Assessment and Plan:     There are no diagnoses linked to this encounter.  ***   Pertinent previous records reviewed include ***    Follow Up: ***     Subjective:   I, Gary Wright, am serving as a Neurosurgeon for Doctor Ulysees Gander   Chief Complaint: hip pain    HPI:    01/29/2024 Patient is a 78 year old male with hip pain. Patient states that he has had pain for over a year. Hip pain has increased low back pain that radiates down to the hips. Walks with a cane. Prednisone  helped some with his hands but not his hip. No meds for the pain. No numbness or tingling. He has decreased ROM. When he turns his right leg in he has hip pain. Hx of back surgery.    02/12/2024 Patient states pain is now going down the right leg with numbness . When he walks he has sharp pain left side low back   03/11/2024 Patient states   Relevant Historical Information: Hypertension, COPD, drug-induced DM, CKD stage IIIb  Additional pertinent review of systems negative.   Current Outpatient Medications:    albuterol  (PROVENTIL ) (2.5 MG/3ML) 0.083% nebulizer solution, USE 1 VIAL VIA NEBULIZER FOUR TIMES DAILY AS NEEDED, Disp: 360 mL, Rfl: 2   albuterol  (VENTOLIN  HFA) 108 (90 Base) MCG/ACT inhaler, INHALE 2 PUFFS BY MOUTH EVERY 4 TO 6 HOURS AS NEEDED, Disp: 20.1 g, Rfl: 1   atorvastatin  (LIPITOR) 40 MG tablet, TAKE 1 TABLET(40 MG) BY MOUTH DAILY AT 6 PM, Disp: 90 tablet, Rfl: 3   budesonide  (PULMICORT ) 0.5 MG/2ML nebulizer solution, Take 2 mLs (0.5 mg total) by nebulization 2 (two) times daily., Disp: 120 mL, Rfl: 11   buPROPion  (WELLBUTRIN  XL) 300 MG 24 hr tablet, Take 300 mg by mouth every morning., Disp: , Rfl: 1   citalopram  (CELEXA ) 40 MG tablet, Take 40 mg by mouth every morning., Disp: , Rfl: 0   clotrimazole -betamethasone  (LOTRISONE ) cream, Apply 1 Application topically  2 (two) times daily. Apply to buttocks and left ankle & heel., Disp: 45 g, Rfl: 2   cyclobenzaprine  (FLEXERIL ) 5 MG tablet, TAKE 1 TO 2 TABLETS(5 TO 10 MG) BY MOUTH NECK TWICE DAILY AS NEEDED FOR MUSCLE SPASMS OR HAND PAIN. MAY CAUSE DROWSINESS, Disp: 30 tablet, Rfl: 1   Dupilumab  (DUPIXENT ) 300 MG/2ML SOAJ, Inject 300 mg into the skin every 14 (fourteen) days., Disp: 12 mL, Rfl: 1   famotidine  (PEPCID ) 20 MG tablet, One after supper, Disp: 30 tablet, Rfl: 11   Fluticasone -Umeclidin-Vilant (TRELEGY ELLIPTA ) 200-62.5-25 MCG/ACT AEPB, Inhale 1 puff into the lungs daily., Disp: , Rfl:    glipiZIDE  (GLUCOTROL  XL) 2.5 MG 24 hr tablet, Take 1 tablet (2.5 mg total) by mouth 2 (two) times daily after a meal., Disp: 60 tablet, Rfl: 2   ipratropium-albuterol  (DUONEB) 0.5-2.5 (3) MG/3ML SOLN, Take 3 mLs by nebulization 4 (four) times daily., Disp: 360 mL, Rfl: 11   meloxicam  (MOBIC ) 15 MG tablet, Take 0.5 tablets (7.5 mg total) by mouth 2 (two) times daily. For neck and hand pain., Disp: 30 tablet, Rfl: 1   methylPREDNISolone  (MEDROL  DOSEPAK) 4 MG TBPK tablet, Take 6 tablets on day 1.  Take 5 tablets on day 2.  Take 4 tablets on day 3.  Take 3 tablets on day 4.  Take 2 tablets on day 5.  Take  1 tablet on day 6., Disp: 21 tablet, Rfl: 0   metoprolol  succinate (TOPROL -XL) 25 MG 24 hr tablet, TAKE 1/2 TABLET(12.5 MG) BY MOUTH DAILY, Disp: 45 tablet, Rfl: 6   montelukast  (SINGULAIR ) 10 MG tablet, Take 10 mg by mouth daily., Disp: , Rfl:    olmesartan  (BENICAR ) 40 MG tablet, Take 1 tablet (40 mg total) by mouth daily., Disp: 30 tablet, Rfl: 11   pantoprazole  (PROTONIX ) 40 MG tablet, Take 1 tablet (40 mg total) by mouth daily. Take 30-60 min before first meal of the day, Disp: 30 tablet, Rfl: 2   zolpidem  (AMBIEN ) 5 MG tablet, Take 1 tablet (5 mg total) by mouth at bedtime as needed for sleep., Disp: 15 tablet, Rfl: 1   Objective:     There were no vitals filed for this visit.    There is no height or weight on  file to calculate BMI.    Physical Exam:    ***   Electronically signed by:  Marshall Skeeter D.[facility] 7:54 AM 03/10/24

## 2024-03-11 ENCOUNTER — Ambulatory Visit: Admitting: Sports Medicine

## 2024-03-12 ENCOUNTER — Ambulatory Visit: Admitting: Sports Medicine

## 2024-03-12 VITALS — HR 102 | Ht 68.0 in | Wt 194.0 lb

## 2024-03-12 DIAGNOSIS — M25552 Pain in left hip: Secondary | ICD-10-CM

## 2024-03-12 DIAGNOSIS — M25551 Pain in right hip: Secondary | ICD-10-CM | POA: Diagnosis not present

## 2024-03-12 DIAGNOSIS — M160 Bilateral primary osteoarthritis of hip: Secondary | ICD-10-CM | POA: Diagnosis not present

## 2024-03-12 DIAGNOSIS — G8929 Other chronic pain: Secondary | ICD-10-CM | POA: Diagnosis not present

## 2024-03-12 DIAGNOSIS — M51362 Other intervertebral disc degeneration, lumbar region with discogenic back pain and lower extremity pain: Secondary | ICD-10-CM

## 2024-03-12 NOTE — Progress Notes (Signed)
 Ben Lequisha Cammack D.[facility] [address] [place] [address] Phone: [phone]   Assessment and Plan:     1. Chronic bilateral low back pain with right-sided sciatica 2. Degeneration of intervertebral disc of lumbar region with discogenic back pain and lower extremity pain -Chronic with exacerbation, subsequent visit - Reviewed patient's MRI which showed severe central canal stenosis at L3-4 and foraminal stenosis at L3-L4 worse on right compared to left.  Remainder of lumbar spine had less severe central canal and foraminal stenosis and degenerative changes - Based on patient's MRI results, back pain with right-sided radicular symptoms, I recommend epidural CSI to right sided L3-4 - Continue Tylenol  for day-to-day pain relief  3. Bilateral hip pain 4. Bilateral hip joint arthritis   -Chronic with exacerbation, subsequent visit - Overall improvement in bilateral hip pain since previous office visit with patient using more frequent Tylenol  for day-to-day pain relief and meloxicam  7.5 mg as needed for breakthrough pain and receiving a bilateral intra-articular hip CSI at previous office visit on 02/12/24 -Continue Tylenol  500 to 1000 mg tablets 2-3 times a day for day-to-day pain relief  -Continue meloxicam  7.5 mg daily as needed for breakthrough pain.  Recommend limiting chronic NSAIDs to 1-2 doses per week  Pertinent previous records reviewed include lumbar MRI 03/04/2024  Follow Up: 2 weeks after epidural CSI to review benefit.  Could consider repeat intra-articular CSI if patient receives at least 3 months relief   Subjective:   I, Moenique Parris, am serving as a Neurosurgeon for Doctor Ulysees Gander   Chief Complaint: hip pain    HPI:    01/29/2024 Patient is a 78 year old male with hip pain. Patient states that he has had pain for over a year. Hip pain has increased low back pain that radiates down to the hips. Walks with a cane. Prednisone   helped some with his hands but not his hip. No meds for the pain. No numbness or tingling. He has decreased ROM. When he turns his right leg in he has hip pain. Hx of back surgery.    02/12/2024 Patient states pain is now going down the right leg with numbness . When he walks he has sharp pain left side low back   03/12/2024 Patient states he is the same . Hips feel pretty good    Relevant Historical Information: Hypertension, COPD, drug-induced DM, CKD stage IIIb  Additional pertinent review of systems negative.   Current Outpatient Medications:    albuterol  (PROVENTIL ) (2.5 MG/3ML) 0.083% nebulizer solution, USE 1 VIAL VIA NEBULIZER FOUR TIMES DAILY AS NEEDED, Disp: 360 mL, Rfl: 2   albuterol  (VENTOLIN  HFA) 108 (90 Base) MCG/ACT inhaler, INHALE 2 PUFFS BY MOUTH EVERY 4 TO 6 HOURS AS NEEDED, Disp: 20.1 g, Rfl: 1   atorvastatin  (LIPITOR) 40 MG tablet, TAKE 1 TABLET(40 MG) BY MOUTH DAILY AT 6 PM, Disp: 90 tablet, Rfl: 3   budesonide  (PULMICORT ) 0.5 MG/2ML nebulizer solution, Take 2 mLs (0.5 mg total) by nebulization 2 (two) times daily., Disp: 120 mL, Rfl: 11   buPROPion  (WELLBUTRIN  XL) 300 MG 24 hr tablet, Take 300 mg by mouth every morning., Disp: , Rfl: 1   citalopram  (CELEXA ) 40 MG tablet, Take 40 mg by mouth every morning., Disp: , Rfl: 0   clotrimazole -betamethasone  (LOTRISONE ) cream, Apply 1 Application topically 2 (two) times daily. Apply to buttocks and left ankle & heel., Disp: 45 g, Rfl: 2   cyclobenzaprine  (FLEXERIL ) 5 MG tablet, TAKE  1 TO 2 TABLETS(5 TO 10 MG) BY MOUTH NECK TWICE DAILY AS NEEDED FOR MUSCLE SPASMS OR HAND PAIN. MAY CAUSE DROWSINESS, Disp: 30 tablet, Rfl: 1   Dupilumab  (DUPIXENT ) 300 MG/2ML SOAJ, Inject 300 mg into the skin every 14 (fourteen) days., Disp: 12 mL, Rfl: 1   famotidine  (PEPCID ) 20 MG tablet, One after supper, Disp: 30 tablet, Rfl: 11   Fluticasone -Umeclidin-Vilant (TRELEGY ELLIPTA ) 200-62.5-25 MCG/ACT AEPB, Inhale 1 puff into the lungs daily., Disp: ,  Rfl:    glipiZIDE  (GLUCOTROL  XL) 2.5 MG 24 hr tablet, Take 1 tablet (2.5 mg total) by mouth 2 (two) times daily after a meal., Disp: 60 tablet, Rfl: 2   ipratropium-albuterol  (DUONEB) 0.5-2.5 (3) MG/3ML SOLN, Take 3 mLs by nebulization 4 (four) times daily., Disp: 360 mL, Rfl: 11   meloxicam  (MOBIC ) 15 MG tablet, Take 0.5 tablets (7.5 mg total) by mouth 2 (two) times daily. For neck and hand pain., Disp: 30 tablet, Rfl: 1   methylPREDNISolone  (MEDROL  DOSEPAK) 4 MG TBPK tablet, Take 6 tablets on day 1.  Take 5 tablets on day 2.  Take 4 tablets on day 3.  Take 3 tablets on day 4.  Take 2 tablets on day 5.  Take 1 tablet on day 6., Disp: 21 tablet, Rfl: 0   metoprolol  succinate (TOPROL -XL) 25 MG 24 hr tablet, TAKE 1/2 TABLET(12.5 MG) BY MOUTH DAILY, Disp: 45 tablet, Rfl: 6   montelukast  (SINGULAIR ) 10 MG tablet, Take 10 mg by mouth daily., Disp: , Rfl:    olmesartan  (BENICAR ) 40 MG tablet, Take 1 tablet (40 mg total) by mouth daily., Disp: 30 tablet, Rfl: 11   pantoprazole  (PROTONIX ) 40 MG tablet, Take 1 tablet (40 mg total) by mouth daily. Take 30-60 min before first meal of the day, Disp: 30 tablet, Rfl: 2   zolpidem  (AMBIEN ) 5 MG tablet, Take 1 tablet (5 mg total) by mouth at bedtime as needed for sleep., Disp: 15 tablet, Rfl: 1   Objective:     Vitals:   03/12/24 1502  Pulse: (!) 102  SpO2: 99%  Weight: 194 lb (88 kg)  Height: 5\' 8"  (1.727 m)      Body mass index is 29.5 kg/m.    Physical Exam:      Gen: Appears well, nad, nontoxic and pleasant Psych: Alert and oriented, appropriate mood and affect Neuro: Decree sensation along anterior thigh wrapping around medial leg on right.  Otherwise sensation intact knee extension and flexion 4+/5 on right compared to left, otherwise strength is 5/5 in upper and lower extremities, muscle tone wnl Skin: no susupicious lesions or rashes   Back - Normal skin, Spine with normal alignment and no deformity.   No tenderness to vertebral process  palpation.   Paraspinous muscles are   tender and without spasm NTTP gluteal musculature Straight leg raise positive right   Gait antalgic, short and slow steps Positive FADIR and FABER and logroll bilaterally, worse on right   Electronically signed by:  Marshall Skeeter D.[facility] 3:34 PM 03/12/24

## 2024-03-12 NOTE — Patient Instructions (Signed)
 Right side L3-4  Follow up 2 weeks after to discuss results

## 2024-03-17 ENCOUNTER — Ambulatory Visit (INDEPENDENT_AMBULATORY_CARE_PROVIDER_SITE_OTHER): Admitting: Family

## 2024-03-17 VITALS — BP 128/76 | HR 95 | Temp 97.7°F | Ht 68.0 in | Wt 190.5 lb

## 2024-03-17 DIAGNOSIS — R21 Rash and other nonspecific skin eruption: Secondary | ICD-10-CM | POA: Diagnosis not present

## 2024-03-17 MED ORDER — TRIAMCINOLONE ACETONIDE 0.5 % EX OINT: 1.0000 | TOPICAL_OINTMENT | Freq: Two times a day (BID) | CUTANEOUS | 0 refills | Status: DC

## 2024-03-17 MED ORDER — METHYLPREDNISOLONE ACETATE 40 MG/ML IJ SUSP
60.0000 mg | Freq: Once | INTRAMUSCULAR | Status: AC
Start: 2024-03-17 — End: 2024-03-17
  Administered 2024-03-17: 60 mg via INTRAMUSCULAR

## 2024-03-17 NOTE — Progress Notes (Signed)
 Patient ID: Gary Wright, male    DOB: 06-05-1946, 78 y.o.   MRN: [id number]  Chief Complaint  Patient presents with   Rash    Pt c/o rash on bilateral ankle and elbows, present for 3 weeks, has been worsening. Has tried cortisone which did relieve itching .   Discussed the use of AI scribe software for clinical note transcription with the patient, who gave verbal consent to proceed.  History of Present Illness Gary Wright is a 78 year old male who presents with a persistent rash and itching on his elbows and ankle.  The rash is inflamed and itchy, new on his elbows, and differing from previous occurrences on his outer left ankle. Over-the-counter cortisone cream provides minimal relief for the itching but does not resolve the inflammation. He also was given clotrimazole -betamethasone  cream in past for his ankle which he said worked only for a short time. No new medications or exposures have been identified as potential triggers. He is on Dupixent  injections every fourteen days. He has received steroid injections for other conditions, including a recent injection in his hip a few weeks ago.  Assessment & Plan Lumbar pain -  Chronic spinal cord compression causing significant pain and mobility issues, particularly in the right leg. Previous hip steroid injections provided limited relief. Awaiting vertebral injection. - Coordinate with imaging center for vertebral injection. - Advised potential need for multiple injections for optimal relief.  Rash with itching and inflammation - Chronic rash with itching and inflammation on both elbows (new) and left ankle. Previous cortisone & antifungal treatment ineffective for ankle. Atypical presentation not consistent with psoriasis or drug reaction. Current Dupixent  use unrelated. - Administer DepoMedrol 60mg  steroid injection for symptom management and reduce risk of further spreading. - Prescribe stronger steroid ointment, Triamcinolone  0.5% for  affected areas. - Advise small amount application on elbows and left ankle. - Consider dermatology referral if symptoms persist. - Suggest OTC calamine lotion to use over steroid ointment if itching continues.  Subjective:     Outpatient Medications Prior to Visit  Medication Sig Dispense Refill   albuterol  (PROVENTIL ) (2.5 MG/3ML) 0.083% nebulizer solution USE 1 VIAL VIA NEBULIZER FOUR TIMES DAILY AS NEEDED 360 mL 2   albuterol  (VENTOLIN  HFA) 108 (90 Base) MCG/ACT inhaler INHALE 2 PUFFS BY MOUTH EVERY 4 TO 6 HOURS AS NEEDED 20.1 g 1   atorvastatin  (LIPITOR) 40 MG tablet TAKE 1 TABLET(40 MG) BY MOUTH DAILY AT 6 PM 90 tablet 3   budesonide  (PULMICORT ) 0.5 MG/2ML nebulizer solution Take 2 mLs (0.5 mg total) by nebulization 2 (two) times daily. 120 mL 11   buPROPion  (WELLBUTRIN  XL) 300 MG 24 hr tablet Take 300 mg by mouth every morning.  1   citalopram  (CELEXA ) 40 MG tablet Take 40 mg by mouth every morning.  0   clotrimazole -betamethasone  (LOTRISONE ) cream Apply 1 Application topically 2 (two) times daily. Apply to buttocks and left ankle & heel. 45 g 2   cyclobenzaprine  (FLEXERIL ) 5 MG tablet TAKE 1 TO 2 TABLETS(5 TO 10 MG) BY MOUTH NECK TWICE DAILY AS NEEDED FOR MUSCLE SPASMS OR HAND PAIN. MAY CAUSE DROWSINESS 30 tablet 1   Dupilumab  (DUPIXENT ) 300 MG/2ML SOAJ Inject 300 mg into the skin every 14 (fourteen) days. 12 mL 1   famotidine  (PEPCID ) 20 MG tablet One after supper 30 tablet 11   Fluticasone -Umeclidin-Vilant (TRELEGY ELLIPTA ) 200-62.5-25 MCG/ACT AEPB Inhale 1 puff into the lungs daily.     glipiZIDE  (GLUCOTROL  XL) 2.5 MG 24  hr tablet Take 1 tablet (2.5 mg total) by mouth 2 (two) times daily after a meal. 60 tablet 2   ipratropium-albuterol  (DUONEB) 0.5-2.5 (3) MG/3ML SOLN Take 3 mLs by nebulization 4 (four) times daily. 360 mL 11   meloxicam  (MOBIC ) 15 MG tablet Take 0.5 tablets (7.5 mg total) by mouth 2 (two) times daily. For neck and hand pain. 30 tablet 1   metoprolol  succinate  (TOPROL -XL) 25 MG 24 hr tablet TAKE 1/2 TABLET(12.5 MG) BY MOUTH DAILY 45 tablet 6   montelukast  (SINGULAIR ) 10 MG tablet Take 10 mg by mouth daily.     olmesartan  (BENICAR ) 40 MG tablet Take 1 tablet (40 mg total) by mouth daily. 30 tablet 11   pantoprazole  (PROTONIX ) 40 MG tablet Take 1 tablet (40 mg total) by mouth daily. Take 30-60 min before first meal of the day 30 tablet 2   zolpidem  (AMBIEN ) 5 MG tablet Take 1 tablet (5 mg total) by mouth at bedtime as needed for sleep. 15 tablet 1   methylPREDNISolone  (MEDROL  DOSEPAK) 4 MG TBPK tablet Take 6 tablets on day 1.  Take 5 tablets on day 2.  Take 4 tablets on day 3.  Take 3 tablets on day 4.  Take 2 tablets on day 5.  Take 1 tablet on day 6. (Patient not taking: Reported on 03/17/2024) 21 tablet 0   No facility-administered medications prior to visit.   Past Medical History:  Diagnosis Date   Acute dyspnea 03/24/2022   Acute exacerbation of chronic obstructive pulmonary disease (COPD) (HCC) 03/25/2022   CAD (coronary artery disease) of artery bypass graft 06/13/2016   Chest pain    COPD (chronic obstructive pulmonary disease) (HCC)    COPD with acute exacerbation (HCC) 11/02/2020   Quit smokig 2019  - PFT's  121/22/23  FEV1 1.83 (60 % ) ratio 0.57  p 15 % improvement from saba p 0 prior to study with DLCO  16.4 (65%)   and FV curve mildly concave  - try off acei and on otc gerd rx 06/02/2023 >>>       Depression 09/01/2007   Qualifier: Diagnosis of  By: Sharlette Dayhoff CMA, [place]     Elevated troponin 06/13/2016   History of lung cancer 11/02/2020   Hyperlipidemia 11/02/2020   Hypertension    Prolonged QT interval 11/02/2020   Stress-induced cardiomyopathy 06/13/2016   TOBACCO ABUSE 09/01/2007   Qualifier: Diagnosis of   By: Sharlette Dayhoff CMA, [place]         Past Surgical History:  Procedure Laterality Date   BACK SURGERY     fusion in 1973   CARDIAC CATHETERIZATION N/A 06/13/2016   Procedure: Left Heart Cath and Coronary Angiography;  Surgeon:  Sammy Crisp, MD;  Location[facility] INVASIVE CV LAB;  Service: Cardiovascular;  Laterality: N/A;   INGUINAL HERNIA REPAIR Right 04/21/2018   Procedure: RIGHT INGUINAL HERNIA REPAIR ERAS PATHWAY;  Surgeon: Sim Dryer, MD;  Location: [facility] OR;  Service: General;  Laterality: Right;  TAP BLOCK   INSERTION OF MESH Right 04/21/2018   Procedure: INSERTION OF MESH;  Surgeon: Sim Dryer, MD;  Location: [facility] OR;  Service: General;  Laterality: Right;   TONSILLECTOMY     No Known Allergies    Objective:    Physical Exam Vitals and nursing note reviewed.  Constitutional:      General: He is not in acute distress.    Appearance: Normal appearance.  HENT:     Head: Normocephalic.  Cardiovascular:     Rate  and Rhythm: Normal rate and regular rhythm.  Pulmonary:     Effort: Pulmonary effort is normal.  Musculoskeletal:        General: Normal range of motion.     Cervical back: Normal range of motion.  Skin:    General: Skin is warm and dry.     Findings: Erythema (bilateral elbows, approx 10cm in diameter, inflamed, diffuse erythema) and rash (left lateral ankle with scaling and a few crusty lesions, no erythema or warmth) present. Rash is urticarial.  Neurological:     Mental Status: He is alert and oriented to person, place, and time.  Psychiatric:        Mood and Affect: Mood normal.      BP 128/76 (BP Location: Left Arm, Patient Position: Sitting, Cuff Size: Large)   Pulse 95   SpO2 98%  Wt Readings from Last 3 Encounters:  03/12/24 194 lb (88 kg)  02/12/24 194 lb (88 kg)  02/05/24 191 lb (86.6 kg)     Versa Gore, NP

## 2024-03-23 ENCOUNTER — Other Ambulatory Visit: Payer: Self-pay | Admitting: Family

## 2024-03-23 DIAGNOSIS — M542 Cervicalgia: Secondary | ICD-10-CM

## 2024-03-23 DIAGNOSIS — M79641 Pain in right hand: Secondary | ICD-10-CM

## 2024-03-23 MED ORDER — CYCLOBENZAPRINE HCL 5 MG PO TABS: 5.0000 mg | ORAL_TABLET | Freq: Three times a day (TID) | ORAL | 5 refills | Status: DC | PRN

## 2024-03-23 NOTE — Telephone Encounter (Signed)
 Copied from CRM [id number]. Topic: Clinical - Medication Refill >> Mar 23, 2024  3:31 PM Adrionna Y wrote: Medication: cyclobenzaprine  (FLEXERIL ) 5 MG tablet  Has the patient contacted their pharmacy? Yes (Agent: If no, request that the patient contact the pharmacy for the refill. If patient does not wish to contact the pharmacy document the reason why and proceed with request.) (Agent: If yes, when and what did the pharmacy advise?)  This is the patient's preferred pharmacy:  [facility] [address], [place] - [address] [place]  [address] Phone: [phone] Fax: [phone]  Is this the correct pharmacy for this prescription? Yes If no, delete pharmacy and type the correct one.   Has the prescription been filled recently? No  Is the patient out of the medication? Yes  Has the patient been seen for an appointment in the last year OR does the patient have an upcoming appointment? Yes  Can we respond through MyChart? Yes  Agent: Please be advised that Rx refills may take up to 3 business days. We ask that you follow-up with your pharmacy.

## 2024-03-25 NOTE — Discharge Instructions (Signed)
 Post Procedure Spinal Discharge Instruction Sheet  You may resume a regular diet and any medications that you routinely take (including pain medications) unless otherwise noted by MD.  No driving day of procedure.  Light activity throughout the rest of the day.  Do not do any strenuous work, exercise, bending or lifting.  The day following the procedure, you can resume normal physical activity but you should refrain from exercising or physical therapy for at least three days thereafter.  You may apply ice to the injection site, 20 minutes on, 20 minutes off, as needed. Do not apply ice directly to skin.    Common Side Effects:  Headaches- take your usual medications as directed by your physician.  Increase your fluid intake.  Caffeinated beverages may be helpful.  Lie flat in bed until your headache resolves.  Restlessness or inability to sleep- you may have trouble sleeping for the next few days.  Ask your referring physician if you need any medication for sleep.  Facial flushing or redness- should subside within a few days.  Increased pain- a temporary increase in pain a day or two following your procedure is not unusual.  Take your pain medication as prescribed by your referring physician.  Leg cramps  Please contact our office at [phone] for the following symptoms: Fever greater than 100 degrees. Headaches unresolved with medication after 2-3 days. Increased swelling, pain, or redness at injection site.  MAY RESUME TAKING YOUR ASPIRIN  TODAY.  Thank you for visiting [facility] today.

## 2024-03-29 ENCOUNTER — Ambulatory Visit
Admission: RE | Admit: 2024-03-29 | Discharge: 2024-03-29 | Disposition: A | Discharge status: Home / Self Care | Source: Ambulatory Visit | Attending: Sports Medicine | Admitting: Sports Medicine

## 2024-03-29 DIAGNOSIS — M51362 Other intervertebral disc degeneration, lumbar region with discogenic back pain and lower extremity pain: Secondary | ICD-10-CM

## 2024-03-29 DIAGNOSIS — M25551 Pain in right hip: Secondary | ICD-10-CM

## 2024-03-29 DIAGNOSIS — G8929 Other chronic pain: Secondary | ICD-10-CM

## 2024-03-29 MED ORDER — METHYLPREDNISOLONE ACETATE 40 MG/ML INJ SUSP (RADIOLOG
80.0000 mg | Freq: Once | INTRAMUSCULAR | Status: AC
Start: 2024-03-29 — End: 2024-03-29
  Administered 2024-03-29: 80 mg via EPIDURAL

## 2024-03-29 MED ORDER — IOPAMIDOL (ISOVUE-M 200) INJECTION 41%
1.0000 mL | Freq: Once | INTRAMUSCULAR | Status: AC
Start: 2024-03-29 — End: 2024-03-29
  Administered 2024-03-29: 1 mL via EPIDURAL

## 2024-03-30 ENCOUNTER — Other Ambulatory Visit: Payer: Self-pay | Admitting: Internal Medicine

## 2024-04-08 ENCOUNTER — Other Ambulatory Visit: Payer: Self-pay

## 2024-04-08 NOTE — Progress Notes (Signed)
 Ben  D.[facility] [address] [place] [address] Phone: [phone]   Assessment and Plan:     There are no diagnoses linked to this encounter.  ***   Pertinent previous records reviewed include ***    Follow Up: ***     Subjective:   I, Hilery Wintle, am serving as a Neurosurgeon for Doctor Ulysees Gander   Chief Complaint: hip pain    HPI:    01/29/2024 Patient is a 78 year old male with hip pain. Patient states that he has had pain for over a year. Hip pain has increased low back pain that radiates down to the hips. Walks with a cane. Prednisone  helped some with his hands but not his hip. No meds for the pain. No numbness or tingling. He has decreased ROM. When he turns his right leg in he has hip pain. Hx of back surgery.    02/12/2024 Patient states pain is now going down the right leg with numbness . When he walks he has sharp pain left side low back    03/12/2024 Patient states he is the same . Hips feel pretty good   04/09/2024 Patient states   Relevant Historical Information: Hypertension, COPD, drug-induced DM, CKD stage IIIb  Additional pertinent review of systems negative.   Current Outpatient Medications:    albuterol  (PROVENTIL ) (2.5 MG/3ML) 0.083% nebulizer solution, USE 1 VIAL VIA NEBULIZER FOUR TIMES DAILY AS NEEDED, Disp: 360 mL, Rfl: 2   albuterol  (VENTOLIN  HFA) 108 (90 Base) MCG/ACT inhaler, INHALE 2 PUFFS BY MOUTH EVERY 4 TO 6 HOURS AS NEEDED, Disp: 20.1 g, Rfl: 1   atorvastatin  (LIPITOR) 40 MG tablet, TAKE 1 TABLET(40 MG) BY MOUTH DAILY AT 6 PM, Disp: 90 tablet, Rfl: 3   budesonide  (PULMICORT ) 0.5 MG/2ML nebulizer solution, Take 2 mLs (0.5 mg total) by nebulization 2 (two) times daily., Disp: 120 mL, Rfl: 11   buPROPion  (WELLBUTRIN  XL) 300 MG 24 hr tablet, Take 300 mg by mouth every morning., Disp: , Rfl: 1   citalopram  (CELEXA ) 40 MG tablet, Take 40 mg by mouth every morning., Disp: , Rfl: 0    clotrimazole -betamethasone  (LOTRISONE ) cream, Apply 1 Application topically 2 (two) times daily. Apply to buttocks and left ankle & heel., Disp: 45 g, Rfl: 2   cyclobenzaprine  (FLEXERIL ) 5 MG tablet, Take 1 tablet (5 mg total) by mouth 3 (three) times daily as needed for muscle spasms., Disp: 30 tablet, Rfl: 5   Dupilumab  (DUPIXENT ) 300 MG/2ML SOAJ, Inject 300 mg into the skin every 14 (fourteen) days., Disp: 12 mL, Rfl: 1   famotidine  (PEPCID ) 20 MG tablet, One after supper, Disp: 30 tablet, Rfl: 11   Fluticasone -Umeclidin-Vilant (TRELEGY ELLIPTA ) 200-62.5-25 MCG/ACT AEPB, Inhale 1 puff into the lungs daily., Disp: , Rfl:    glipiZIDE  (GLUCOTROL  XL) 2.5 MG 24 hr tablet, Take 1 tablet (2.5 mg total) by mouth 2 (two) times daily after a meal., Disp: 60 tablet, Rfl: 2   ipratropium-albuterol  (DUONEB) 0.5-2.5 (3) MG/3ML SOLN, Take 3 mLs by nebulization 4 (four) times daily., Disp: 360 mL, Rfl: 11   meloxicam  (MOBIC ) 15 MG tablet, Take 0.5 tablets (7.5 mg total) by mouth 2 (two) times daily. For neck and hand pain., Disp: 30 tablet, Rfl: 1   metoprolol  succinate (TOPROL -XL) 25 MG 24 hr tablet, TAKE 1/2 TABLET(12.5 MG) BY MOUTH DAILY, Disp: 45 tablet, Rfl: 6   montelukast  (SINGULAIR ) 10 MG tablet, Take 10 mg by mouth daily., Disp: ,  Rfl:    olmesartan  (BENICAR ) 40 MG tablet, Take 1 tablet (40 mg total) by mouth daily., Disp: 30 tablet, Rfl: 11   pantoprazole  (PROTONIX ) 40 MG tablet, TAKE 1 TABLET(40 MG) BY MOUTH DAILY 30 TO 60 MINUTES BEFORE FIRST MEAL OF THE DAY, Disp: 30 tablet, Rfl: 2   triamcinolone  ointment (KENALOG ) 0.5 %, Apply 1 Application topically 2 (two) times daily. Apply to both elbows and ankle., Disp: 30 g, Rfl: 0   zolpidem  (AMBIEN ) 5 MG tablet, Take 1 tablet (5 mg total) by mouth at bedtime as needed for sleep., Disp: 15 tablet, Rfl: 1   Objective:     There were no vitals filed for this visit.    There is no height or weight on file to calculate BMI.    Physical Exam:     ***   Electronically signed by:  Marshall Skeeter D.[facility] 7:36 AM 04/08/24

## 2024-04-09 ENCOUNTER — Ambulatory Visit: Admitting: Sports Medicine

## 2024-04-09 VITALS — HR 65 | Ht 68.0 in | Wt 190.0 lb

## 2024-04-09 DIAGNOSIS — M51362 Other intervertebral disc degeneration, lumbar region with discogenic back pain and lower extremity pain: Secondary | ICD-10-CM

## 2024-04-09 DIAGNOSIS — G8929 Other chronic pain: Secondary | ICD-10-CM | POA: Diagnosis not present

## 2024-04-09 NOTE — Patient Instructions (Signed)
 Epidural right L3-4  Follow up 2 weeks after to discuss results

## 2024-04-12 ENCOUNTER — Other Ambulatory Visit: Payer: Self-pay | Admitting: Nurse Practitioner

## 2024-04-12 ENCOUNTER — Other Ambulatory Visit: Payer: Self-pay

## 2024-04-12 DIAGNOSIS — J4489 Other specified chronic obstructive pulmonary disease: Secondary | ICD-10-CM

## 2024-04-12 DIAGNOSIS — J4550 Severe persistent asthma, uncomplicated: Secondary | ICD-10-CM

## 2024-04-12 MED ORDER — DUPIXENT 300 MG/2ML ~~LOC~~ SOAJ
300.0000 mg | SUBCUTANEOUS | 0 refills | Status: DC
  Filled 2024-04-12: qty 12, 84d supply, fill #0
  Filled 2024-04-13 – 2024-04-16 (×2): qty 4, 28d supply, fill #0
  Filled 2024-05-27 – 2024-05-31 (×2): qty 4, 28d supply, fill #1
  Filled 2024-06-22: qty 4, 28d supply, fill #2

## 2024-04-12 NOTE — Telephone Encounter (Signed)
 Routing to Rx team for specialty med.

## 2024-04-12 NOTE — Telephone Encounter (Signed)
 Refill sent for DUPIXENT  to [facility]: [phone]   Dose: 300mg  subcut every 14 days  Last OV: 01/13/24 Provider: Dr. [name]  Next OV: due in September 2025 not yet scheduled  Sherry Pennant, PharmD, MPH, BCPS Clinical Pharmacist (Rheumatology and Pulmonology)

## 2024-04-13 ENCOUNTER — Other Ambulatory Visit: Payer: Self-pay

## 2024-04-14 ENCOUNTER — Emergency Department (HOSPITAL_COMMUNITY)
Admission: EM | Admit: 2024-04-14 | Discharge: 2024-04-15 | Disposition: A | Discharge status: Home / Self Care | Attending: Emergency Medicine | Admitting: Emergency Medicine

## 2024-04-14 ENCOUNTER — Other Ambulatory Visit: Payer: Self-pay

## 2024-04-14 ENCOUNTER — Encounter (HOSPITAL_COMMUNITY): Payer: Self-pay

## 2024-04-14 ENCOUNTER — Ambulatory Visit: Payer: Self-pay

## 2024-04-14 ENCOUNTER — Ambulatory Visit (INDEPENDENT_AMBULATORY_CARE_PROVIDER_SITE_OTHER): Admitting: Family

## 2024-04-14 ENCOUNTER — Encounter: Payer: Self-pay | Admitting: Family

## 2024-04-14 VITALS — BP 72/40 | HR 98 | Temp 99.1°F | Ht 68.0 in | Wt 183.6 lb

## 2024-04-14 DIAGNOSIS — Z79899 Other long term (current) drug therapy: Secondary | ICD-10-CM | POA: Insufficient documentation

## 2024-04-14 DIAGNOSIS — R197 Diarrhea, unspecified: Secondary | ICD-10-CM | POA: Diagnosis present

## 2024-04-14 DIAGNOSIS — I10 Essential (primary) hypertension: Secondary | ICD-10-CM | POA: Diagnosis not present

## 2024-04-14 DIAGNOSIS — K529 Noninfective gastroenteritis and colitis, unspecified: Secondary | ICD-10-CM | POA: Diagnosis not present

## 2024-04-14 DIAGNOSIS — N179 Acute kidney failure, unspecified: Secondary | ICD-10-CM | POA: Insufficient documentation

## 2024-04-14 DIAGNOSIS — E860 Dehydration: Secondary | ICD-10-CM | POA: Diagnosis not present

## 2024-04-14 LAB — CBC
HCT: 42.2 % (ref 39.0–52.0)
Hemoglobin: 13.6 g/dL (ref 13.0–17.0)
MCH: 31.6 pg (ref 26.0–34.0)
MCHC: 32.2 g/dL (ref 30.0–36.0)
MCV: 98.1 fL (ref 80.0–100.0)
Platelets: 265 10*3/uL (ref 150–400)
RBC: 4.3 MIL/uL (ref 4.22–5.81)
RDW: 13.7 % (ref 11.5–15.5)
WBC: 12.6 10*3/uL — ABNORMAL HIGH (ref 4.0–10.5)
nRBC: 0 % (ref 0.0–0.2)

## 2024-04-14 LAB — COMPREHENSIVE METABOLIC PANEL WITH GFR
ALT: 15 U/L (ref 0–44)
AST: 20 U/L (ref 15–41)
Albumin: 3.9 g/dL (ref 3.5–5.0)
Alkaline Phosphatase: 64 U/L (ref 38–126)
Anion gap: 10 (ref 5–15)
BUN: 28 mg/dL — ABNORMAL HIGH (ref 8–23)
CO2: 25 mmol/L (ref 22–32)
Calcium: 8.6 mg/dL — ABNORMAL LOW (ref 8.9–10.3)
Chloride: 102 mmol/L (ref 98–111)
Creatinine, Ser: 1.6 mg/dL — ABNORMAL HIGH (ref 0.61–1.24)
GFR, Estimated: 44 mL/min — ABNORMAL LOW (ref >60)
Glucose, Bld: 101 mg/dL — ABNORMAL HIGH (ref 70–99)
Potassium: 4.3 mmol/L (ref 3.5–5.1)
Sodium: 137 mmol/L (ref 135–145)
Total Bilirubin: 0.8 mg/dL (ref 0.0–1.2)
Total Protein: 6.9 g/dL (ref 6.5–8.1)

## 2024-04-14 LAB — MAGNESIUM: Magnesium: 1.9 mg/dL (ref 1.7–2.4)

## 2024-04-14 MED ORDER — ONDANSETRON HCL 4 MG PO TABS: 4.0000 mg | ORAL_TABLET | Freq: Three times a day (TID) | ORAL | 0 refills | Status: DC | PRN

## 2024-04-14 MED ORDER — SODIUM CHLORIDE 0.9 % IV BOLUS
500.0000 mL | Freq: Once | INTRAVENOUS | Status: AC
  Administered 2024-04-14: 500 mL via INTRAVENOUS

## 2024-04-14 MED ORDER — DIPHENOXYLATE-ATROPINE 2.5-0.025 MG PO TABS: 1.0000 | ORAL_TABLET | Freq: Four times a day (QID) | ORAL | 0 refills | Status: DC | PRN

## 2024-04-14 NOTE — ED Triage Notes (Signed)
 Pt arrives via EMS from PCP. PT c/o diarrhea x a couple of weeks. Reports nausea, vomiting, and dizziness since last night. Initial BP with ems was 80/40. EMS administered 850mL of LR and 4mg  of zofran . Pt arrives AxOx4. BP after fluids was 121/65.

## 2024-04-14 NOTE — Progress Notes (Signed)
 Patient ID: Gary Wright, male    DOB: [birth year], 78 y.o.   MRN: [id number]  Chief Complaint  Patient presents with   Diarrhea    About two weeks diarrhea. Kopectate slowed down towards end of first week then it started again. Last night first night vomiting. Weakness started since diarrhea.    Emesis   Extremity Weakness  Discussed the use of AI scribe software for clinical note transcription with the patient, who gave verbal consent to proceed.  History of Present Illness Gary Wright is a 78 year old male who presents with low blood pressure and gastrointestinal symptoms.  He has experienced diarrhea for the past couple of weeks, leading to weakness and the need to sit down to prevent falls. He manages these symptoms with crackers and bananas. Gastrointestinal symptoms began with nausea for a couple of days, followed by vomiting last night. He reports a new sensation in his stomach without cramping pain. Diarrhea started two weeks ago, initially five to six times daily, watery and loose. Kaopectate reduced frequency but not looseness. A small episode of diarrhea occurred two hours before the visit. He has lost approximately 14 pounds over the past few weeks, with seven pounds lost in the last week. He hydrates with water and Gatorade, consuming half of a 16-ounce Gatorade today. He avoids caffeine, coffee, and sodas, and plans to eat pea soup today. He discontinued Singulair  and Ambien , noting improved sleep without Ambien . He remains on his current medications without new additions.  Assessment & Plan Acute Gastroenteritis Suspected viral gastroenteritis with weight loss due to decreased intake and fluid loss. - Discontinue Kaopectate. - Prescribe Lomotil medication: start with one pill, increase to two if needed, max eight pills in 24 hours. - Prescribe Ondansetron  4mg  tid prn, start with 1 pill in am, add as needed during the day. - Stop anti-diarrheal once stools are formed to  prevent constipation. - Increase water intake to at least 2.5 liters of non-caffeinated beverages.  Hypotension Hypotension likely due to dehydration from diarrhea and vomiting, causing weakness and dizziness. - Increase fluid intake, especially water and electrolyte solutions. - Avoid caffeine to prevent dehydration. - Avoid heat exposure until symptoms improve.  *Unable to get BP higher than 78/60, transported to ED via EMS. Wife notified.  Weight Loss Unhealthy 14-pound weight loss over two weeks due to decreased intake and fluid loss. - Monitor weight and nutritional intake.   Subjective:    Outpatient Medications Prior to Visit  Medication Sig Dispense Refill   albuterol  (PROVENTIL ) (2.5 MG/3ML) 0.083% nebulizer solution USE 1 VIAL VIA NEBULIZER FOUR TIMES DAILY AS NEEDED 360 mL 2   albuterol  (VENTOLIN  HFA) 108 (90 Base) MCG/ACT inhaler INHALE 2 PUFFS BY MOUTH EVERY 4 TO 6 HOURS AS NEEDED 20.1 g 1   atorvastatin  (LIPITOR) 40 MG tablet TAKE 1 TABLET(40 MG) BY MOUTH DAILY AT 6 PM 90 tablet 3   budesonide  (PULMICORT ) 0.5 MG/2ML nebulizer solution Take 2 mLs (0.5 mg total) by nebulization 2 (two) times daily. 120 mL 11   buPROPion  (WELLBUTRIN  XL) 300 MG 24 hr tablet Take 300 mg by mouth every morning.  1   citalopram  (CELEXA ) 40 MG tablet Take 40 mg by mouth every morning.  0   clotrimazole -betamethasone  (LOTRISONE ) cream Apply 1 Application topically 2 (two) times daily. Apply to buttocks and left ankle & heel. 45 g 2   cyclobenzaprine  (FLEXERIL ) 5 MG tablet Take 1 tablet (5 mg total) by mouth 3 (three) times daily as  needed for muscle spasms. 30 tablet 5   Dupilumab  (DUPIXENT ) 300 MG/2ML SOAJ Inject 300 mg into the skin every 14 (fourteen) days. 12 mL 0   famotidine  (PEPCID ) 20 MG tablet One after supper 30 tablet 11   Fluticasone -Umeclidin-Vilant (TRELEGY ELLIPTA ) 200-62.5-25 MCG/ACT AEPB Inhale 1 puff into the lungs daily.     glipiZIDE  (GLUCOTROL  XL) 2.5 MG 24 hr tablet Take 1  tablet (2.5 mg total) by mouth 2 (two) times daily after a meal. 60 tablet 2   ipratropium-albuterol  (DUONEB) 0.5-2.5 (3) MG/3ML SOLN Take 3 mLs by nebulization 4 (four) times daily. 360 mL 11   metoprolol  succinate (TOPROL -XL) 25 MG 24 hr tablet TAKE 1/2 TABLET(12.5 MG) BY MOUTH DAILY 45 tablet 6   olmesartan  (BENICAR ) 40 MG tablet Take 1 tablet (40 mg total) by mouth daily. 30 tablet 11   pantoprazole  (PROTONIX ) 40 MG tablet TAKE 1 TABLET(40 MG) BY MOUTH DAILY 30 TO 60 MINUTES BEFORE FIRST MEAL OF THE DAY 30 tablet 2   triamcinolone  ointment (KENALOG ) 0.5 % Apply 1 Application topically 2 (two) times daily. Apply to both elbows and ankle. 30 g 0   meloxicam  (MOBIC ) 15 MG tablet Take 0.5 tablets (7.5 mg total) by mouth 2 (two) times daily. For neck and hand pain. (Patient not taking: Reported on 04/14/2024) 30 tablet 1   zolpidem  (AMBIEN ) 5 MG tablet Take 1 tablet (5 mg total) by mouth at bedtime as needed for sleep. (Patient not taking: Reported on 04/14/2024) 15 tablet 1   montelukast  (SINGULAIR ) 10 MG tablet Take 10 mg by mouth daily. (Patient not taking: Reported on 04/14/2024)     No facility-administered medications prior to visit.   Past Medical History:  Diagnosis Date   Acute dyspnea 03/24/2022   Acute exacerbation of chronic obstructive pulmonary disease (COPD) (HCC) 03/25/2022   CAD (coronary artery disease) of artery bypass graft 06/13/2016   Chest pain    COPD (chronic obstructive pulmonary disease) (HCC)    COPD with acute exacerbation (HCC) 11/02/2020   Quit smokig 2019  - PFT's  121/22/23  FEV1 1.83 (60 % ) ratio 0.57  p 15 % improvement from saba p 0 prior to study with DLCO  16.4 (65%)   and FV curve mildly concave  - try off acei and on otc gerd rx 06/02/2023 >>>       Depression 09/01/2007   Qualifier: Diagnosis of  By: Nickola CMA, [place]     Elevated troponin 06/13/2016   History of lung cancer 11/02/2020   Hyperlipidemia 11/02/2020   Hypertension    Prolonged QT  interval 11/02/2020   Stress-induced cardiomyopathy 06/13/2016   TOBACCO ABUSE 09/01/2007   Qualifier: Diagnosis of   By: Nickola CMA, [place]         Past Surgical History:  Procedure Laterality Date   BACK SURGERY     fusion in 1973   CARDIAC CATHETERIZATION N/A 06/13/2016   Procedure: Left Heart Cath and Coronary Angiography;  Surgeon: Lonni Hanson, MD;  [facility] INVASIVE CV LAB;  Service: Cardiovascular;  Laterality: N/A;   INGUINAL HERNIA REPAIR Right 04/21/2018   Procedure: RIGHT INGUINAL HERNIA REPAIR ERAS PATHWAY;  Surgeon: Vanderbilt Ned, MD;  Location: [facility] OR;  Service: General;  Laterality: Right;  TAP BLOCK   INSERTION OF MESH Right 04/21/2018   Procedure: INSERTION OF MESH;  Surgeon: Vanderbilt Ned, MD;  Location: [facility] OR;  Service: General;  Laterality: Right;   TONSILLECTOMY     No Known Allergies  Objective:    Physical Exam Vitals and nursing note reviewed.  Constitutional:      General: He is not in acute distress.    Appearance: Normal appearance.  HENT:     Head: Normocephalic.   Cardiovascular:     Rate and Rhythm: Normal rate and regular rhythm.  Pulmonary:     Effort: Pulmonary effort is normal.     Breath sounds: Normal breath sounds.   Musculoskeletal:        General: Normal range of motion.     Cervical back: Normal range of motion.   Skin:    General: Skin is warm and dry.   Neurological:     Mental Status: He is alert and oriented to person, place, and time.   Psychiatric:        Mood and Affect: Mood normal.    BP (!) 72/40   Pulse 98   Temp 99.1 F (37.3 C) (Temporal)   Ht 5' 8 (1.727 m)   Wt 183 lb 9.6 oz (83.3 kg)   SpO2 100%   BMI 27.92 kg/m  Wt Readings from Last 3 Encounters:  04/14/24 183 lb 9.6 oz (83.3 kg)  04/09/24 190 lb (86.2 kg)  03/17/24 190 lb 8 oz (86.4 kg)      Lucius Krabbe, NP

## 2024-04-14 NOTE — ED Notes (Addendum)
 Pt left AMA, Pt still here

## 2024-04-14 NOTE — Telephone Encounter (Signed)
 FYI Only or Action Required?: FYI only for provider.  Patient was last seen in primary care on 03/17/2024 by Gary Krabbe, NP. Called Nurse Triage reporting Diarrhea and Vomiting. Symptoms began several weeks ago. Interventions attempted: OTC medications: Kaopectate. Symptoms are: gradually worsening.  Triage Disposition: See Physician Within 24 Hours  Patient/caregiver understands and will follow disposition?: Yes                             Copied from CRM [id number]. Topic: Clinical - Red Word Triage >> Apr 14, 2024  2:02 PM Gary Wright wrote: Red Word that prompted transfer to Nurse Triage: Patient called in stated he has been having diarrhea  for 2 weeks now, and has been very weak and also throwing up    ----------------------------------------------------------------------- From previous Reason for Contact - Scheduling: Patient/patient representative is calling to schedule an appointment. Refer to attachments for appointment information. Reason for Disposition  [1] MODERATE diarrhea (e.g., 4-6 times / day more than normal) AND [2] present > 48 hours (2 days)  Answer Assessment - Initial Assessment Questions 1. DIARRHEA SEVERITY: How bad is the diarrhea? How many more stools have you had in the past 24 hours than normal?    - NO DIARRHEA (SCALE 0)   - MILD (SCALE 1-3): Few loose or mushy BMs; increase of 1-3 stools over normal daily number of stools; mild increase in ostomy output.   -  MODERATE (SCALE 4-7): Increase of 4-6 stools daily over normal; moderate increase in ostomy output.   -  SEVERE (SCALE 8-10; OR WORST POSSIBLE): Increase of 7 or more stools daily over normal; moderate increase in ostomy output; incontinence.     Moderate, 4-5 x a day 2. ONSET: When did the diarrhea begin?      2 weeks ago 3. BM CONSISTENCY: How loose or watery is the diarrhea?      Mostly watery 4. VOMITING: Are you also vomiting? If Yes, ask: How many times  in the past 24 hours?      Yes, 1 x yesterday evening 5. ABDOMEN PAIN: Are you having any abdomen pain? If Yes, ask: What does it feel like? (e.g., crampy, dull, intermittent, constant)      Denies 7. ORAL INTAKE: If vomiting, Have you been able to drink liquids? How much liquids have you had in the past 24 hours?     States he has been able to drink liquids, including Gatorade, states he has been trying to keep his meals light 8. HYDRATION: Any signs of dehydration? (e.g., dry mouth [not just dry lips], too weak to stand, dizziness, new weight loss) When did you last urinate?     Weakness, states he had to sit down in the store yesterday because he felt weak, denies dizziness, denies fainting/falls 9. EXPOSURE: Have you traveled to a foreign country recently? Have you been exposed to anyone with diarrhea? Could you have eaten any food that was spoiled?     Denies 10. ANTIBIOTIC USE: Are you taking antibiotics now or have you taken antibiotics in the past 2 months?     Denies 11. OTHER SYMPTOMS: Do you have any other symptoms? (e.g., fever, blood in stool)     Stools are greenish/black, denies fever, denies blood in stool  Protocols used: Diarrhea-A-AH

## 2024-04-14 NOTE — ED Provider Triage Note (Signed)
 Emergency Medicine Provider Triage Evaluation Note  Gary Wright , a 78 y.o. male  was evaluated in triage.  Pt complains of low blood pressure.  Patient was at his primary care provider's office when his blood pressure was found to be 70/40-ish, he feels that he may have been dehydrated because he has been having diarrhea for several weeks and had several episodes of vomiting last night.  He denies abdominal pain, chest pain, shortness of breath.  Reports that since receiving IV fluids and Zofran  en route with EMS he is feeling much better.  Review of Systems  Positive: As above Negative: As above  Physical Exam  BP 125/64   Pulse 87   Temp 98.6 F (37 C) (Oral)   Resp 18   SpO2 100%  Gen:   Awake, no distress   Resp:  Normal effort  MSK:   Moves extremities without difficulty  Other:    Medical Decision Making  Medically screening exam initiated at 5:55 PM.  Appropriate orders placed.  Gary Wright was informed that the remainder of the evaluation will be completed by another provider, this initial triage assessment does not replace that evaluation, and the importance of remaining in the ED until their evaluation is complete.     Gary Wright, [place] 04/14/24 1756

## 2024-04-14 NOTE — ED Provider Notes (Signed)
 [place] EMERGENCY DEPARTMENT AT [facility] Provider Note   CSN: [id number] Arrival date & time: 04/14/24  [id number]     Patient presents with: Nausea and Diarrhea   Gary Wright is a 78 y.o. male.  {Add pertinent medical, surgical, social history, OB history to YEP:67052} The history is provided by the patient and medical records.  Diarrhea Amaad Byers is a 78 y.o. male who presents to the Emergency Department complaining of *** Bp low at pcp.  Ems called and received IVF. Now BP normal 70s/40s Went due to vomiting and diarrhea Got sick last night with episode of emesis.  Has 1.5 weeks of diarrhea 4episodes daily - took kaopectate and it improved, then returned after stopping the medicine.    No fever, chest pain, sob, abdominal pain.  No dysuria.    Hx/o HTN, DM    Prior to Admission medications   Medication Sig Start Date End Date Taking? Authorizing Provider  albuterol  (PROVENTIL ) (2.5 MG/3ML) 0.083% nebulizer solution USE 1 VIAL VIA NEBULIZER FOUR TIMES DAILY AS NEEDED 09/12/23   [name] Dorn NOVAK, MD  albuterol  (VENTOLIN  HFA) 108 (90 Base) MCG/ACT inhaler INHALE 2 PUFFS BY MOUTH EVERY 4 TO 6 HOURS AS NEEDED 03/03/23   [name] Dorn NOVAK, MD  atorvastatin  (LIPITOR) 40 MG tablet TAKE 1 TABLET(40 MG) BY MOUTH DAILY AT 6 PM 08/18/23   Pietro Redell RAMAN, MD  budesonide  (PULMICORT ) 0.5 MG/2ML nebulizer solution Take 2 mLs (0.5 mg total) by nebulization 2 (two) times daily. 01/13/24   [name] Dorn NOVAK, MD  buPROPion  (WELLBUTRIN  XL) 300 MG 24 hr tablet Take 300 mg by mouth every morning. 05/11/16   [provider]  citalopram  (CELEXA ) 40 MG tablet Take 40 mg by mouth every morning. 06/01/16   [provider]  clotrimazole -betamethasone  (LOTRISONE ) cream Apply 1 Application topically 2 (two) times daily. Apply to buttocks and left ankle & heel. 02/05/24   Lucius Krabbe, NP  cyclobenzaprine  (FLEXERIL ) 5 MG tablet Take 1 tablet (5 mg total) by mouth 3  (three) times daily as needed for muscle spasms. 03/23/24   Lucius Krabbe, NP  diphenoxylate-atropine (LOMOTIL) 2.5-0.025 MG tablet Take 1-2 tablets by mouth 4 (four) times daily as needed for diarrhea or loose stools (Max dose of 8 tabs in 24 hours). 04/14/24   Lucius Krabbe, NP  Dupilumab  (DUPIXENT ) 300 MG/2ML SOAJ Inject 300 mg into the skin every 14 (fourteen) days. 04/12/24   [name] Dorn NOVAK, MD  famotidine  (PEPCID ) 20 MG tablet One after supper 12/22/23   Darlean Ozell NOVAK, MD  Fluticasone -Umeclidin-Vilant (TRELEGY ELLIPTA ) 200-62.5-25 MCG/ACT AEPB Inhale 1 puff into the lungs daily.    [provider]  glipiZIDE  (GLUCOTROL  XL) 2.5 MG 24 hr tablet Take 1 tablet (2.5 mg total) by mouth 2 (two) times daily after a meal. 01/27/24   Hudnell, Krabbe, NP  ipratropium-albuterol  (DUONEB) 0.5-2.5 (3) MG/3ML SOLN Take 3 mLs by nebulization 4 (four) times daily. 12/22/23   Darlean Ozell NOVAK, MD  meloxicam  (MOBIC ) 15 MG tablet Take 0.5 tablets (7.5 mg total) by mouth 2 (two) times daily. For neck and hand pain. Patient not taking: Reported on 04/14/2024 01/02/24   Lucius Krabbe, NP  metoprolol  succinate (TOPROL -XL) 25 MG 24 hr tablet TAKE 1/2 TABLET(12.5 MG) BY MOUTH DAILY 01/20/23   Pietro Redell RAMAN, MD  olmesartan  (BENICAR ) 40 MG tablet Take 1 tablet (40 mg total) by mouth daily. 06/02/23 06/01/24  Darlean Ozell NOVAK, MD  ondansetron  (ZOFRAN ) 4 MG tablet Take 1 tablet (  4 mg total) by mouth every 8 (eight) hours as needed for nausea or vomiting. 04/14/24   Lucius Krabbe, NP  pantoprazole  (PROTONIX ) 40 MG tablet TAKE 1 TABLET(40 MG) BY MOUTH DAILY 30 TO 60 MINUTES BEFORE FIRST MEAL OF THE DAY 03/30/24   Darlean Ozell NOVAK, MD  triamcinolone  ointment (KENALOG ) 0.5 % Apply 1 Application topically 2 (two) times daily. Apply to both elbows and ankle. 03/17/24   Lucius Krabbe, NP  zolpidem  (AMBIEN ) 5 MG tablet Take 1 tablet (5 mg total) by mouth at bedtime as needed for sleep. Patient not taking:  Reported on 04/14/2024 11/26/23   Lucius Krabbe, NP    Allergies: Patient has no known allergies.    Review of Systems  Gastrointestinal:  Positive for diarrhea.  All other systems reviewed and are negative.   Updated Vital Signs BP 107/71   Pulse 86   Temp 98.5 F (36.9 C) (Oral)   Resp 14   SpO2 100%   Physical Exam Vitals and nursing note reviewed.  Constitutional:      Appearance: He is well-developed.  HENT:     Head: Normocephalic and atraumatic.   Cardiovascular:     Rate and Rhythm: Normal rate and regular rhythm.     Heart sounds: No murmur heard. Pulmonary:     Effort: Pulmonary effort is normal. No respiratory distress.     Breath sounds: Normal breath sounds.  Abdominal:     Palpations: Abdomen is soft.     Tenderness: There is no abdominal tenderness. There is no guarding or rebound.   Musculoskeletal:        General: No swelling or tenderness.   Skin:    General: Skin is warm and dry.   Neurological:     Mental Status: He is alert and oriented to person, place, and time.   Psychiatric:        Behavior: Behavior normal.     (all labs ordered are listed, but only abnormal results are displayed) Labs Reviewed  CBC - Abnormal; Notable for the following components:      Result Value   WBC 12.6 (*)    All other components within normal limits  COMPREHENSIVE METABOLIC PANEL WITH GFR - Abnormal; Notable for the following components:   Glucose, Bld 101 (*)    BUN 28 (*)    Creatinine, Ser 1.60 (*)    Calcium  8.6 (*)    GFR, Estimated 44 (*)    All other components within normal limits  MAGNESIUM   URINALYSIS, ROUTINE W REFLEX MICROSCOPIC    EKG: None  Radiology: No results found.  {Document cardiac monitor, telemetry assessment procedure when appropriate:32947} Procedures   Medications Ordered in the ED - No data to display    {Click here for ABCD2, HEART and other calculators REFRESH Note before signing:1}                               Medical Decision Making  ***  {Document critical care time when appropriate  Document review of labs and clinical decision tools ie CHADS2VASC2, etc  Document your independent review of radiology images and any outside records  Document your discussion with family members, caretakers and with consultants  Document social determinants of health affecting pt's care  Document your decision making why or why not admission, treatments were needed:32947:::1}   Final diagnoses:  None    ED Discharge Orders     None

## 2024-04-15 ENCOUNTER — Other Ambulatory Visit: Payer: Self-pay

## 2024-04-15 ENCOUNTER — Other Ambulatory Visit (HOSPITAL_COMMUNITY): Payer: Self-pay

## 2024-04-15 ENCOUNTER — Telehealth: Payer: Self-pay | Admitting: Pulmonary Disease

## 2024-04-15 LAB — BASIC METABOLIC PANEL WITH GFR
Anion gap: 9 (ref 5–15)
BUN: 25 mg/dL — ABNORMAL HIGH (ref 8–23)
CO2: 24 mmol/L (ref 22–32)
Calcium: 8.5 mg/dL — ABNORMAL LOW (ref 8.9–10.3)
Chloride: 105 mmol/L (ref 98–111)
Creatinine, Ser: 1.53 mg/dL — ABNORMAL HIGH (ref 0.61–1.24)
GFR, Estimated: 47 mL/min — ABNORMAL LOW (ref >60)
Glucose, Bld: 144 mg/dL — ABNORMAL HIGH (ref 70–99)
Potassium: 4.2 mmol/L (ref 3.5–5.1)
Sodium: 138 mmol/L (ref 135–145)

## 2024-04-15 LAB — URINALYSIS, ROUTINE W REFLEX MICROSCOPIC
Bilirubin Urine: NEGATIVE
Glucose, UA: NEGATIVE mg/dL
Hgb urine dipstick: NEGATIVE
Ketones, ur: NEGATIVE mg/dL
Leukocytes,Ua: NEGATIVE
Nitrite: NEGATIVE
Protein, ur: NEGATIVE mg/dL
Specific Gravity, Urine: 1.018 (ref 1.005–1.030)
pH: 6 (ref 5.0–8.0)

## 2024-04-15 NOTE — ED Notes (Signed)
 Pt's wife here. PT wheeled out

## 2024-04-15 NOTE — ED Notes (Signed)
 Patient is resting comfortably.

## 2024-04-15 NOTE — ED Notes (Signed)
Graham crackers given.

## 2024-04-15 NOTE — Telephone Encounter (Signed)
 Please contact patient and schedule as advised by triage nurse

## 2024-04-15 NOTE — Discharge Instructions (Signed)
 Do not take your benicar  this week.  See your doctor in the next week for recheck.

## 2024-04-15 NOTE — ED Notes (Signed)
 PT is going to wait until 0600 in hall bed and call ride

## 2024-04-15 NOTE — ED Notes (Signed)
 Wife was contacted by RN. She is OTW to pick up ETA 

## 2024-04-15 NOTE — Telephone Encounter (Signed)
 Please call [facility] @ [phone]. He is calling for info on this PT. and to make sure their meds shipment arrived. I will do an encounter too.

## 2024-04-15 NOTE — Telephone Encounter (Signed)
Pt was  already seen by provider.

## 2024-04-15 NOTE — Telephone Encounter (Signed)
 Pt has historically filled through [facility], so it is unclear how [facility] has managed to get their hands on a prescription for this patient that was somehow written by a provider who (according to our records in Epic) has never been involved with this pt's Dupixent  therapy. We will investigate this matter and f/u.

## 2024-04-16 ENCOUNTER — Other Ambulatory Visit: Payer: Self-pay

## 2024-04-16 ENCOUNTER — Telehealth: Payer: Self-pay

## 2024-04-16 NOTE — Discharge Instructions (Signed)

## 2024-04-16 NOTE — Progress Notes (Signed)
 Specialty Pharmacy Refill Coordination Note  Gary Wright is a 78 y.o. male contacted today regarding refills of specialty medication(s) Dupilumab  (Dupixent )   Patient requested Delivery   Delivery date: 05/05/24   Verified address: [address] [place] [address]   Medication will be filled on 05/04/24.

## 2024-04-16 NOTE — Telephone Encounter (Signed)
 Team members over at our central fill location and they confirmed for me that they have NOT transferred the rx to [facility].  Contacted Dale with [facility] for more info. After a great deal of digging through documents he was able to finally verify that the prescription itself did in fact originate from the Dupixent  [employer] enrollment application that was sent in December and was then triaged over to [facility] in mid-January. According to Cheryl, the Rx was shipped to the patient on 6/23. It is unclear why the prescription had unnoticed all these months or why it was suddenly filled all these months later.  Attempted to contact pt to discuss, left VoiceMail requesting a return call. Direct office number provided.

## 2024-04-16 NOTE — Transitions of Care (Post Inpatient/ED Visit) (Signed)
   04/16/2024  Name: Djuan Talton MRN: [id number] DOB: 12-Apr-1946  Today's TOC FU Call Status:    Attempted to reach the patient regarding the most recent Inpatient/ED visit.  Follow Up Plan: Additional outreach attempts will be made to reach the patient to complete the Transitions of Care (Post Inpatient/ED visit) call.   Koleen Robson, CMA II

## 2024-04-19 ENCOUNTER — Ambulatory Visit
Admission: RE | Admit: 2024-04-19 | Discharge: 2024-04-19 | Disposition: A | Discharge status: Home / Self Care | Source: Ambulatory Visit | Attending: Sports Medicine | Admitting: Sports Medicine

## 2024-04-19 DIAGNOSIS — G8929 Other chronic pain: Secondary | ICD-10-CM

## 2024-04-19 DIAGNOSIS — M51362 Other intervertebral disc degeneration, lumbar region with discogenic back pain and lower extremity pain: Secondary | ICD-10-CM

## 2024-04-19 MED ORDER — IOPAMIDOL (ISOVUE-M 200) INJECTION 41%
1.0000 mL | Freq: Once | INTRAMUSCULAR | Status: AC
Start: 2024-04-19 — End: 2024-04-19
  Administered 2024-04-19: 1 mL via EPIDURAL

## 2024-04-19 MED ORDER — METHYLPREDNISOLONE ACETATE 40 MG/ML INJ SUSP (RADIOLOG
80.0000 mg | Freq: Once | INTRAMUSCULAR | Status: AC
Start: 2024-04-19 — End: 2024-04-19
  Administered 2024-04-19: 80 mg via EPIDURAL

## 2024-04-20 ENCOUNTER — Ambulatory Visit (INDEPENDENT_AMBULATORY_CARE_PROVIDER_SITE_OTHER): Admitting: Family

## 2024-04-20 VITALS — BP 117/74 | HR 92 | Wt 182.6 lb

## 2024-04-20 DIAGNOSIS — E860 Dehydration: Secondary | ICD-10-CM | POA: Diagnosis not present

## 2024-04-20 DIAGNOSIS — M2550 Pain in unspecified joint: Secondary | ICD-10-CM

## 2024-04-20 DIAGNOSIS — E861 Hypovolemia: Secondary | ICD-10-CM | POA: Diagnosis not present

## 2024-04-20 NOTE — Progress Notes (Signed)
 Patient ID: Gary Wright, male    DOB: [birth year], 78 y.o.   MRN: [id number]  Chief Complaint  Patient presents with   Follow-up    Dehydration and low BP  Discussed the use of AI scribe software for clinical note transcription with the patient, who gave verbal consent to proceed.  History of Present Illness Gary Wright is a 78 year old male who presents for follow-up after hospitalization for dehydration.  Hypotension and dehydration - Sent to ED from office last week d/t hypotension with blood pressure of 70/40 mmHg, now improved to normal range today. Has not taken Olemesartan 40mg  since last Thursday. Has continued Metoprolol  qd. - Felt 'a little woozy' after receiving an epidural - Received intravenous fluids over six hours during ED visit  Gastrointestinal symptoms - Diarrhea and vomiting have resolved - Stools are now formed - No recurrence of diarrhea since discharge  Hand swelling - Right Hand swelling with arthritic fingers, unable to move well or close into fist, occurs infrequently - Managed with prednisone  in past  Assessment & Plan Hypotension Recent hypotension likely due to dehydration from diarrhea and vomiting, seen in ER last Thursday evening. Adjusted blood pressure medication to prevent recurrence. - Advise 2 liters of water daily along with 1 Propel or sugar free Gatorade  - Avoid excessive caffeine. - Continue metoprolol  daily. - Hold olmesartan  unless blood pressure increases. - Monitor blood pressure daily. - Restart olmesartan  at half dose (20mg ) if blood pressure exceeds 130/90 mmHg. - F/U in 2 mos  Dehydration Dehydration secondary to low fluid intake, diarrhea & vomiting, causing hypotension and acute renal impact, seen in ER last Thursday and then sent home. Received IV fluids. Doing better, not as tired. Emphasized hydration importance. - Encourage hydration with water and electrolyte beverages.  Joint Pain Intermittent right hand pain  and swelling d/t arthritis. Previous prednisone  treatments help.  Consider specialist evaluation. - Discuss symptoms with Dr. Joane. - Consider hand specialist referral if symptoms persist.   Subjective:    Outpatient Medications Prior to Visit  Medication Sig Dispense Refill   albuterol  (PROVENTIL ) (2.5 MG/3ML) 0.083% nebulizer solution USE 1 VIAL VIA NEBULIZER FOUR TIMES DAILY AS NEEDED 360 mL 2   albuterol  (VENTOLIN  HFA) 108 (90 Base) MCG/ACT inhaler INHALE 2 PUFFS BY MOUTH EVERY 4 TO 6 HOURS AS NEEDED 20.1 g 1   atorvastatin  (LIPITOR) 40 MG tablet TAKE 1 TABLET(40 MG) BY MOUTH DAILY AT 6 PM 90 tablet 3   budesonide  (PULMICORT ) 0.5 MG/2ML nebulizer solution Take 2 mLs (0.5 mg total) by nebulization 2 (two) times daily. 120 mL 11   buPROPion  (WELLBUTRIN  XL) 300 MG 24 hr tablet Take 300 mg by mouth every morning.  1   citalopram  (CELEXA ) 40 MG tablet Take 40 mg by mouth every morning.  0   clotrimazole -betamethasone  (LOTRISONE ) cream Apply 1 Application topically 2 (two) times daily. Apply to buttocks and left ankle & heel. 45 g 2   cyclobenzaprine  (FLEXERIL ) 5 MG tablet Take 1 tablet (5 mg total) by mouth 3 (three) times daily as needed for muscle spasms. 30 tablet 5   diphenoxylate -atropine  (LOMOTIL ) 2.5-0.025 MG tablet Take 1-2 tablets by mouth 4 (four) times daily as needed for diarrhea or loose stools (Max dose of 8 tabs in 24 hours). 30 tablet 0   Dupilumab  (DUPIXENT ) 300 MG/2ML SOAJ Inject 300 mg into the skin every 14 (fourteen) days. 12 mL 0   famotidine  (PEPCID ) 20 MG tablet One after supper 30 tablet 11  Fluticasone -Umeclidin-Vilant (TRELEGY ELLIPTA ) 200-62.5-25 MCG/ACT AEPB Inhale 1 puff into the lungs daily.     glipiZIDE  (GLUCOTROL  XL) 2.5 MG 24 hr tablet Take 1 tablet (2.5 mg total) by mouth 2 (two) times daily after a meal. 60 tablet 2   ipratropium-albuterol  (DUONEB) 0.5-2.5 (3) MG/3ML SOLN Take 3 mLs by nebulization 4 (four) times daily. 360 mL 11   metoprolol  succinate  (TOPROL -XL) 25 MG 24 hr tablet TAKE 1/2 TABLET(12.5 MG) BY MOUTH DAILY 45 tablet 6   olmesartan  (BENICAR ) 40 MG tablet Take 1 tablet (40 mg total) by mouth daily. 30 tablet 11   ondansetron  (ZOFRAN ) 4 MG tablet Take 1 tablet (4 mg total) by mouth every 8 (eight) hours as needed for nausea or vomiting. 20 tablet 0   pantoprazole  (PROTONIX ) 40 MG tablet TAKE 1 TABLET(40 MG) BY MOUTH DAILY 30 TO 60 MINUTES BEFORE FIRST MEAL OF THE DAY 30 tablet 2   triamcinolone  ointment (KENALOG ) 0.5 % Apply 1 Application topically 2 (two) times daily. Apply to both elbows and ankle. 30 g 0   meloxicam  (MOBIC ) 15 MG tablet Take 0.5 tablets (7.5 mg total) by mouth 2 (two) times daily. For neck and hand pain. (Patient not taking: Reported on 04/20/2024) 30 tablet 1   zolpidem  (AMBIEN ) 5 MG tablet Take 1 tablet (5 mg total) by mouth at bedtime as needed for sleep. (Patient not taking: Reported on 04/14/2024) 15 tablet 1   No facility-administered medications prior to visit.   Past Medical History:  Diagnosis Date   Acute dyspnea 03/24/2022   Acute exacerbation of chronic obstructive pulmonary disease (COPD) (HCC) 03/25/2022   CAD (coronary artery disease) of artery bypass graft 06/13/2016   Chest pain    COPD (chronic obstructive pulmonary disease) (HCC)    COPD with acute exacerbation (HCC) 11/02/2020   Quit smokig 2019  - PFT's  121/22/23  FEV1 1.83 (60 % ) ratio 0.57  p 15 % improvement from saba p 0 prior to study with DLCO  16.4 (65%)   and FV curve mildly concave  - try off acei and on otc gerd rx 06/02/2023 >>>       Depression 09/01/2007   Qualifier: Diagnosis of  By: Nickola CMA, [place]     Elevated troponin 06/13/2016   History of lung cancer 11/02/2020   Hyperlipidemia 11/02/2020   Hypertension    Prolonged QT interval 11/02/2020   Stress-induced cardiomyopathy 06/13/2016   TOBACCO ABUSE 09/01/2007   Qualifier: Diagnosis of   By: Nickola CMA, [place]         Past Surgical History:  Procedure  Laterality Date   BACK SURGERY     fusion in 1973   CARDIAC CATHETERIZATION N/A 06/13/2016   Procedure: Left Heart Cath and Coronary Angiography;  Surgeon: Lonni Hanson, MD;  Location: [facility] INVASIVE CV LAB;  Service: Cardiovascular;  Laterality: N/A;   INGUINAL HERNIA REPAIR Right 04/21/2018   Procedure: RIGHT INGUINAL HERNIA REPAIR ERAS PATHWAY;  Surgeon: Vanderbilt Ned, MD;  Location: [facility] OR;  Service: General;  Laterality: Right;  TAP BLOCK   INSERTION OF MESH Right 04/21/2018   Procedure: INSERTION OF MESH;  Surgeon: Vanderbilt Ned, MD;  Location: [facility] OR;  Service: General;  Laterality: Right;   TONSILLECTOMY     No Known Allergies    Objective:    Physical Exam Vitals and nursing note reviewed.  Constitutional:      General: He is not in acute distress.    Appearance: Normal appearance.  HENT:  Head: Normocephalic.   Cardiovascular:     Rate and Rhythm: Normal rate and regular rhythm.  Pulmonary:     Effort: Pulmonary effort is normal.     Breath sounds: Normal breath sounds.   Musculoskeletal:        General: Normal range of motion.     Cervical back: Normal range of motion.   Skin:    General: Skin is warm and dry.   Neurological:     Mental Status: He is alert and oriented to person, place, and time.   Psychiatric:        Mood and Affect: Mood normal.    BP 117/74   Pulse 92   Wt 182 lb 9.6 oz (82.8 kg)   SpO2 95%   BMI 27.76 kg/m  Wt Readings from Last 3 Encounters:  04/20/24 182 lb 9.6 oz (82.8 kg)  04/14/24 183 lb 9.6 oz (83.3 kg)  04/09/24 190 lb (86.2 kg)      Lucius Krabbe, NP

## 2024-04-20 NOTE — Patient Instructions (Addendum)
 It was very nice to see you today!   Your Blood pressure is much better. Be sure you keep hydrated, at least 2 liters = 4 or 5 16.9oz water bottles daily. Ok to continue to take the Metoprolol  succinate daily. Hold the Olmesartan  for now.  Monitor your blood pressure daily and if you see it start to get higher than 130/90 either number, restart the Olmesartan  but at 1/2 pill daily.  And let me know if it is not coming down enough or if you notice the diarrhea coming back.  Schedule a 2 month follow up visit today.      PLEASE NOTE:  If you had any lab tests please let us  know if you have not heard back within a few days. You may see your results on MyChart before we have a chance to review them but we will give you a call once they are reviewed by us . If we ordered any referrals today, please let us  know if you have not heard from their office within the next week.

## 2024-04-21 ENCOUNTER — Other Ambulatory Visit: Payer: Self-pay | Admitting: Cardiology

## 2024-04-30 ENCOUNTER — Other Ambulatory Visit: Payer: Self-pay

## 2024-04-30 NOTE — Progress Notes (Unsigned)
 [name] [address] [place] [address] Phone: [phone]   Assessment and Plan:     There are no diagnoses linked to this encounter.  ***   Pertinent previous records reviewed include ***    Follow Up: ***     Subjective:   I, Gary Wright, am serving as a Neurosurgeon for Doctor [name]   Chief Complaint: hip pain    HPI:    01/29/2024 Patient is a 78 year old male with hip pain. Patient states that he has had pain for over a year. Hip pain has increased low back pain that radiates down to the hips. Walks with a cane. Prednisone  helped some with his hands but not his hip. No meds for the pain. No numbness or tingling. He has decreased ROM. When he turns his right leg in he has hip pain. Hx of back surgery.    02/12/2024 Patient states pain is now going down the right leg with numbness . When he walks he has sharp pain left side low back    03/12/2024 Patient states he is the same . Hips feel pretty good    04/09/2024 Patient states that he got about 90 % improvement but the pain is starting to come back   05/03/2024 Patient states   Relevant Historical Information: Hypertension, COPD, drug-induced DM, CKD stage IIIb  Additional pertinent review of systems negative.   Current Outpatient Medications:    albuterol  (PROVENTIL ) (2.5 MG/3ML) 0.083% nebulizer solution, USE 1 VIAL VIA NEBULIZER FOUR TIMES DAILY AS NEEDED, Disp: 360 mL, Rfl: 2   albuterol  (VENTOLIN  HFA) 108 (90 Base) MCG/ACT inhaler, INHALE 2 PUFFS BY MOUTH EVERY 4 TO 6 HOURS AS NEEDED, Disp: 20.1 g, Rfl: 1   atorvastatin  (LIPITOR) 40 MG tablet, TAKE 1 TABLET(40 MG) BY MOUTH DAILY AT 6 PM, Disp: 90 tablet, Rfl: 3   budesonide  (PULMICORT ) 0.5 MG/2ML nebulizer solution, Take 2 mLs (0.5 mg total) by nebulization 2 (two) times daily., Disp: 120 mL, Rfl: 11   buPROPion  (WELLBUTRIN  XL) 300 MG 24 hr tablet, Take 300 mg by mouth every morning., Disp: ,  Rfl: 1   citalopram  (CELEXA ) 40 MG tablet, Take 40 mg by mouth every morning., Disp: , Rfl: 0   clotrimazole -betamethasone  (LOTRISONE ) cream, Apply 1 Application topically 2 (two) times daily. Apply to buttocks and left ankle & heel., Disp: 45 g, Rfl: 2   cyclobenzaprine  (FLEXERIL ) 5 MG tablet, Take 1 tablet (5 mg total) by mouth 3 (three) times daily as needed for muscle spasms., Disp: 30 tablet, Rfl: 5   diphenoxylate -atropine  (LOMOTIL ) 2.5-0.025 MG tablet, Take 1-2 tablets by mouth 4 (four) times daily as needed for diarrhea or loose stools (Max dose of 8 tabs in 24 hours)., Disp: 30 tablet, Rfl: 0   Dupilumab  (DUPIXENT ) 300 MG/2ML SOAJ, Inject 300 mg into the skin every 14 (fourteen) days., Disp: 12 mL, Rfl: 0   famotidine  (PEPCID ) 20 MG tablet, One after supper, Disp: 30 tablet, Rfl: 11   Fluticasone -Umeclidin-Vilant (TRELEGY ELLIPTA ) 200-62.5-25 MCG/ACT AEPB, Inhale 1 puff into the lungs daily., Disp: , Rfl:    glipiZIDE  (GLUCOTROL  XL) 2.5 MG 24 hr tablet, Take 1 tablet (2.5 mg total) by mouth 2 (two) times daily after a meal., Disp: 60 tablet, Rfl: 2   ipratropium-albuterol  (DUONEB) 0.5-2.5 (3) MG/3ML SOLN, Take 3 mLs by nebulization 4 (four) times daily., Disp: 360 mL, Rfl: 11   metoprolol  succinate (TOPROL -XL) 25 MG 24 hr  tablet, TAKE 1/2 TABLET(12.5 MG) BY MOUTH DAILY, Disp: 45 tablet, Rfl: 0   olmesartan  (BENICAR ) 40 MG tablet, Take 1 tablet (40 mg total) by mouth daily., Disp: 30 tablet, Rfl: 11   ondansetron  (ZOFRAN ) 4 MG tablet, Take 1 tablet (4 mg total) by mouth every 8 (eight) hours as needed for nausea or vomiting., Disp: 20 tablet, Rfl: 0   pantoprazole  (PROTONIX ) 40 MG tablet, TAKE 1 TABLET(40 MG) BY MOUTH DAILY 30 TO 60 MINUTES BEFORE FIRST MEAL OF THE DAY, Disp: 30 tablet, Rfl: 2   triamcinolone  ointment (KENALOG ) 0.5 %, Apply 1 Application topically 2 (two) times daily. Apply to both elbows and ankle., Disp: 30 g, Rfl: 0   Objective:     There were no vitals filed for this  visit.    There is no height or weight on file to calculate BMI.    Physical Exam:    ***   Electronically signed by:  [name] 7:36 AM 04/30/24

## 2024-05-02 ENCOUNTER — Other Ambulatory Visit: Payer: Self-pay | Admitting: Family

## 2024-05-02 DIAGNOSIS — E099 Drug or chemical induced diabetes mellitus without complications: Secondary | ICD-10-CM

## 2024-05-03 ENCOUNTER — Ambulatory Visit: Admitting: Sports Medicine

## 2024-05-03 VITALS — BP 124/78 | HR 94 | Ht 68.0 in | Wt 185.0 lb

## 2024-05-03 DIAGNOSIS — M51362 Other intervertebral disc degeneration, lumbar region with discogenic back pain and lower extremity pain: Secondary | ICD-10-CM | POA: Diagnosis not present

## 2024-05-03 DIAGNOSIS — G8929 Other chronic pain: Secondary | ICD-10-CM

## 2024-05-03 NOTE — Patient Instructions (Signed)
 Right L3-4  Follow up 2 weeks after

## 2024-05-05 ENCOUNTER — Other Ambulatory Visit: Payer: Self-pay | Admitting: Family

## 2024-05-05 DIAGNOSIS — R21 Rash and other nonspecific skin eruption: Secondary | ICD-10-CM

## 2024-05-06 NOTE — Telephone Encounter (Signed)
 Alwin, was this handled?

## 2024-05-07 ENCOUNTER — Other Ambulatory Visit (HOSPITAL_COMMUNITY): Payer: Self-pay

## 2024-05-12 NOTE — Discharge Instructions (Signed)

## 2024-05-13 ENCOUNTER — Ambulatory Visit
Admission: RE | Admit: 2024-05-13 | Discharge: 2024-05-13 | Disposition: A | Discharge status: Home / Self Care | Source: Ambulatory Visit | Attending: Sports Medicine | Admitting: Sports Medicine

## 2024-05-13 DIAGNOSIS — M51362 Other intervertebral disc degeneration, lumbar region with discogenic back pain and lower extremity pain: Secondary | ICD-10-CM

## 2024-05-13 DIAGNOSIS — G8929 Other chronic pain: Secondary | ICD-10-CM

## 2024-05-13 MED ORDER — IOPAMIDOL (ISOVUE-M 200) INJECTION 41%
1.0000 mL | Freq: Once | INTRAMUSCULAR | Status: AC
  Administered 2024-05-13: 1 mL via EPIDURAL

## 2024-05-13 MED ORDER — METHYLPREDNISOLONE ACETATE 40 MG/ML INJ SUSP (RADIOLOG
80.0000 mg | Freq: Once | INTRAMUSCULAR | Status: AC
  Administered 2024-05-13: 80 mg via EPIDURAL

## 2024-05-23 ENCOUNTER — Other Ambulatory Visit: Payer: Self-pay | Admitting: Family

## 2024-05-23 DIAGNOSIS — R21 Rash and other nonspecific skin eruption: Secondary | ICD-10-CM

## 2024-05-25 ENCOUNTER — Other Ambulatory Visit: Payer: Self-pay | Admitting: Family

## 2024-05-25 DIAGNOSIS — R21 Rash and other nonspecific skin eruption: Secondary | ICD-10-CM

## 2024-05-26 ENCOUNTER — Other Ambulatory Visit: Payer: Self-pay

## 2024-05-26 ENCOUNTER — Other Ambulatory Visit: Payer: Self-pay | Admitting: Family

## 2024-05-26 DIAGNOSIS — R21 Rash and other nonspecific skin eruption: Secondary | ICD-10-CM

## 2024-05-26 MED ORDER — PANTOPRAZOLE SODIUM 40 MG PO TBEC: DELAYED_RELEASE_TABLET | ORAL | 1 refills | Status: DC

## 2024-05-26 NOTE — Telephone Encounter (Signed)
 Patient requesting 90 days supply of pantoprazole .

## 2024-05-26 NOTE — Progress Notes (Unsigned)
 [facility] [address] [place] [address] Phone: [phone]   Assessment and Plan:     There are no diagnoses linked to this encounter.  ***   Pertinent previous records reviewed include ***    Follow Up: ***     Subjective:   I, Gary Wright, am serving as a Neurosurgeon for Doctor Gary Wright   Chief Complaint: hip pain    HPI:    01/29/2024 Patient is a 78 year old male with hip pain. Patient states that he has had pain for over a year. Hip pain has increased low back pain that radiates down to the hips. Walks with a cane. Prednisone  helped some with his hands but not his hip. No meds for the pain. No numbness or tingling. He has decreased ROM. When he turns his right leg in he has hip pain. Hx of back surgery.    02/12/2024 Patient states pain is now going down the right leg with numbness . When he walks he has sharp pain left side low back    03/12/2024 Patient states he is the same . Hips feel pretty good    04/09/2024 Patient states that he got about 90 % improvement but the pain is starting to come back    05/03/2024 Patient states he got about 60 % improvement he still has pain in the center his back  down his legs . But he has had a lot of Improvement  05/27/2024 Patient states   Relevant Historical Information: Hypertension, COPD, drug-induced DM, CKD stage IIIb  Additional pertinent review of systems negative.   Current Outpatient Medications:    albuterol  (PROVENTIL ) (2.5 MG/3ML) 0.083% nebulizer solution, USE 1 VIAL VIA NEBULIZER FOUR TIMES DAILY AS NEEDED, Disp: 360 mL, Rfl: 2   albuterol  (VENTOLIN  HFA) 108 (90 Base) MCG/ACT inhaler, INHALE 2 PUFFS BY MOUTH EVERY 4 TO 6 HOURS AS NEEDED, Disp: 20.1 g, Rfl: 1   atorvastatin  (LIPITOR) 40 MG tablet, TAKE 1 TABLET(40 MG) BY MOUTH DAILY AT 6 PM, Disp: 90 tablet, Rfl: 3   budesonide  (PULMICORT ) 0.5 MG/2ML nebulizer solution, Take 2 mLs (0.5 mg total) by  nebulization 2 (two) times daily., Disp: 120 mL, Rfl: 11   buPROPion  (WELLBUTRIN  XL) 300 MG 24 hr tablet, Take 300 mg by mouth every morning., Disp: , Rfl: 1   citalopram  (CELEXA ) 40 MG tablet, Take 40 mg by mouth every morning., Disp: , Rfl: 0   clotrimazole -betamethasone  (LOTRISONE ) cream, Apply 1 Application topically 2 (two) times daily. Apply to buttocks and left ankle & heel., Disp: 45 g, Rfl: 2   cyclobenzaprine  (FLEXERIL ) 5 MG tablet, Take 1 tablet (5 mg total) by mouth 3 (three) times daily as needed for muscle spasms., Disp: 30 tablet, Rfl: 5   diphenoxylate -atropine  (LOMOTIL ) 2.5-0.025 MG tablet, Take 1-2 tablets by mouth 4 (four) times daily as needed for diarrhea or loose stools (Max dose of 8 tabs in 24 hours)., Disp: 30 tablet, Rfl: 0   Dupilumab  (DUPIXENT ) 300 MG/2ML SOAJ, Inject 300 mg into the skin every 14 (fourteen) days., Disp: 12 mL, Rfl: 0   famotidine  (PEPCID ) 20 MG tablet, One after supper, Disp: 30 tablet, Rfl: 11   Fluticasone -Umeclidin-Vilant (TRELEGY ELLIPTA ) 200-62.5-25 MCG/ACT AEPB, Inhale 1 puff into the lungs daily., Disp: , Rfl:    glipiZIDE  (GLUCOTROL  XL) 2.5 MG 24 hr tablet, TAKE 1 TABLET(2.5 MG) BY MOUTH TWICE DAILY AFTER A MEAL, Disp: 60 tablet, Rfl: 2  ipratropium-albuterol  (DUONEB) 0.5-2.5 (3) MG/3ML SOLN, Take 3 mLs by nebulization 4 (four) times daily., Disp: 360 mL, Rfl: 11   metoprolol  succinate (TOPROL -XL) 25 MG 24 hr tablet, TAKE 1/2 TABLET(12.5 MG) BY MOUTH DAILY, Disp: 45 tablet, Rfl: 0   olmesartan  (BENICAR ) 40 MG tablet, Take 1 tablet (40 mg total) by mouth daily., Disp: 30 tablet, Rfl: 11   ondansetron  (ZOFRAN ) 4 MG tablet, Take 1 tablet (4 mg total) by mouth every 8 (eight) hours as needed for nausea or vomiting., Disp: 20 tablet, Rfl: 0   pantoprazole  (PROTONIX ) 40 MG tablet, TAKE 1 TABLET(40 MG) BY MOUTH DAILY 30 TO 60 MINUTES BEFORE FIRST MEAL OF THE DAY, Disp: 30 tablet, Rfl: 2   triamcinolone  ointment (KENALOG ) 0.5 %, APPLY OINTMENT TOPICALLY  TWICE DAILY TO BOTH ELBOWS AND ANKLE, Disp: 30 g, Rfl: 0   Objective:     There were no vitals filed for this visit.    There is no height or weight on file to calculate BMI.    Physical Exam:    ***   Electronically signed by:  [facility] 7:45 AM 05/26/24

## 2024-05-27 ENCOUNTER — Other Ambulatory Visit: Payer: Self-pay

## 2024-05-27 ENCOUNTER — Ambulatory Visit: Admitting: Sports Medicine

## 2024-05-27 ENCOUNTER — Other Ambulatory Visit: Payer: Self-pay | Admitting: Family

## 2024-05-27 VITALS — Ht 68.0 in | Wt 185.0 lb

## 2024-05-27 DIAGNOSIS — R21 Rash and other nonspecific skin eruption: Secondary | ICD-10-CM

## 2024-05-27 DIAGNOSIS — M25551 Pain in right hip: Secondary | ICD-10-CM

## 2024-05-27 DIAGNOSIS — M25552 Pain in left hip: Secondary | ICD-10-CM | POA: Diagnosis not present

## 2024-05-27 DIAGNOSIS — M160 Bilateral primary osteoarthritis of hip: Secondary | ICD-10-CM | POA: Diagnosis not present

## 2024-05-27 DIAGNOSIS — G8929 Other chronic pain: Secondary | ICD-10-CM

## 2024-05-27 DIAGNOSIS — M51362 Other intervertebral disc degeneration, lumbar region with discogenic back pain and lower extremity pain: Secondary | ICD-10-CM | POA: Diagnosis not present

## 2024-05-27 NOTE — Patient Instructions (Addendum)
 3 month follow-up

## 2024-05-31 ENCOUNTER — Other Ambulatory Visit: Payer: Self-pay

## 2024-05-31 ENCOUNTER — Other Ambulatory Visit: Payer: Self-pay | Admitting: Family

## 2024-05-31 DIAGNOSIS — R21 Rash and other nonspecific skin eruption: Secondary | ICD-10-CM

## 2024-05-31 NOTE — Progress Notes (Signed)
 Specialty Pharmacy Refill Coordination Note  Gary Wright is a 78 y.o. male contacted today regarding refills of specialty medication(s) Dupilumab  (Dupixent )   Patient requested Delivery   Delivery date: 06/02/24   Verified address: [address] [place] [address]   Medication will be filled on 06/01/24.

## 2024-05-31 NOTE — Telephone Encounter (Signed)
 Copied from CRM [id number]. Topic: Clinical - Prescription Issue >> May 31, 2024  3:19 PM Mesmerise C wrote: Reason for CRM: Patient would like his triamcinolone  ointment (KENALOG ) 0.5 % to be resent to [id number] [place], [place] - [address] AT [facility] OF [address] & [facility] [address] [place] [address] Phone: [phone] Fax: [phone] Hours: Not open 24 hours instead of [facility]

## 2024-06-01 ENCOUNTER — Other Ambulatory Visit: Payer: Self-pay | Admitting: Family

## 2024-06-01 DIAGNOSIS — R21 Rash and other nonspecific skin eruption: Secondary | ICD-10-CM

## 2024-06-01 NOTE — Telephone Encounter (Signed)
?  ok to refill °

## 2024-06-02 MED ORDER — TRIAMCINOLONE ACETONIDE 0.5 % EX OINT: TOPICAL_OINTMENT | Freq: Two times a day (BID) | CUTANEOUS | 0 refills | Status: DC

## 2024-06-03 ENCOUNTER — Other Ambulatory Visit: Payer: Self-pay | Admitting: Family

## 2024-06-03 DIAGNOSIS — R21 Rash and other nonspecific skin eruption: Secondary | ICD-10-CM

## 2024-06-04 ENCOUNTER — Ambulatory Visit: Payer: Self-pay | Admitting: Family

## 2024-06-04 ENCOUNTER — Other Ambulatory Visit: Payer: Self-pay

## 2024-06-04 ENCOUNTER — Ambulatory Visit: Payer: Self-pay | Admitting: Pulmonary Disease

## 2024-06-04 ENCOUNTER — Emergency Department (HOSPITAL_BASED_OUTPATIENT_CLINIC_OR_DEPARTMENT_OTHER): Admitting: Radiology

## 2024-06-04 ENCOUNTER — Emergency Department (HOSPITAL_BASED_OUTPATIENT_CLINIC_OR_DEPARTMENT_OTHER)
Admission: EM | Admit: 2024-06-04 | Discharge: 2024-06-04 | Disposition: A | Discharge status: Home / Self Care | Attending: Emergency Medicine | Admitting: Emergency Medicine

## 2024-06-04 ENCOUNTER — Other Ambulatory Visit (HOSPITAL_BASED_OUTPATIENT_CLINIC_OR_DEPARTMENT_OTHER): Payer: Self-pay

## 2024-06-04 ENCOUNTER — Encounter (HOSPITAL_BASED_OUTPATIENT_CLINIC_OR_DEPARTMENT_OTHER): Payer: Self-pay | Admitting: Respiratory Therapy

## 2024-06-04 DIAGNOSIS — Z79899 Other long term (current) drug therapy: Secondary | ICD-10-CM | POA: Diagnosis not present

## 2024-06-04 DIAGNOSIS — Z7984 Long term (current) use of oral hypoglycemic drugs: Secondary | ICD-10-CM | POA: Insufficient documentation

## 2024-06-04 DIAGNOSIS — J441 Chronic obstructive pulmonary disease with (acute) exacerbation: Secondary | ICD-10-CM | POA: Insufficient documentation

## 2024-06-04 DIAGNOSIS — R0602 Shortness of breath: Secondary | ICD-10-CM | POA: Diagnosis present

## 2024-06-04 DIAGNOSIS — D72829 Elevated white blood cell count, unspecified: Secondary | ICD-10-CM | POA: Diagnosis not present

## 2024-06-04 DIAGNOSIS — Z7951 Long term (current) use of inhaled steroids: Secondary | ICD-10-CM | POA: Diagnosis not present

## 2024-06-04 LAB — BASIC METABOLIC PANEL WITH GFR
Anion gap: 13 (ref 5–15)
BUN: 32 mg/dL — ABNORMAL HIGH (ref 8–23)
CO2: 24 mmol/L (ref 22–32)
Calcium: 9.4 mg/dL (ref 8.9–10.3)
Chloride: 104 mmol/L (ref 98–111)
Creatinine, Ser: 1.58 mg/dL — ABNORMAL HIGH (ref 0.61–1.24)
GFR, Estimated: 45 mL/min — ABNORMAL LOW (ref >60)
Glucose, Bld: 106 mg/dL — ABNORMAL HIGH (ref 70–99)
Potassium: 4.5 mmol/L (ref 3.5–5.1)
Sodium: 141 mmol/L (ref 135–145)

## 2024-06-04 LAB — CBC
HCT: 40.5 % (ref 39.0–52.0)
Hemoglobin: 13.1 g/dL (ref 13.0–17.0)
MCH: 31.6 pg (ref 26.0–34.0)
MCHC: 32.3 g/dL (ref 30.0–36.0)
MCV: 97.6 fL (ref 80.0–100.0)
Platelets: 281 K/uL (ref 150–400)
RBC: 4.15 MIL/uL — ABNORMAL LOW (ref 4.22–5.81)
RDW: 13.2 % (ref 11.5–15.5)
WBC: 12.1 K/uL — ABNORMAL HIGH (ref 4.0–10.5)
nRBC: 0 % (ref 0.0–0.2)

## 2024-06-04 MED ORDER — PREDNISONE 20 MG PO TABS
40.0000 mg | ORAL_TABLET | Freq: Once | ORAL | Status: AC
  Administered 2024-06-04: 40 mg via ORAL
  Filled 2024-06-04: qty 2

## 2024-06-04 MED ORDER — IPRATROPIUM-ALBUTEROL 0.5-2.5 (3) MG/3ML IN SOLN
3.0000 mL | Freq: Once | RESPIRATORY_TRACT | Status: AC
  Administered 2024-06-04: 3 mL via RESPIRATORY_TRACT
  Filled 2024-06-04: qty 3

## 2024-06-04 MED ORDER — PREDNISONE 10 MG PO TABS
ORAL_TABLET | Freq: Every day | ORAL | 0 refills | Status: DC
  Filled 2024-06-04: qty 42, 12d supply, fill #0

## 2024-06-04 NOTE — ED Triage Notes (Signed)
 PT complains of COPD and Asthma exacerbation for past 1.5 week. Called PCP to get steroid and was told to come to ER for workup. Pt ambulatory to triage. Denies any pain. Pt sob worse with walking and talking.

## 2024-06-04 NOTE — ED Notes (Signed)
 Patient transported to X-ray

## 2024-06-04 NOTE — ED Provider Notes (Signed)
 [place] EMERGENCY DEPARTMENT AT [facility] Provider Note   CSN: [id number] Arrival date & time: 06/04/24  1115     Patient presents with: Shortness of Breath   Gary Wright is a 78 y.o. male.   HPI Patient port has had longstanding COPD.  He has been using his home nebulizer treatment and all of his medications as prescribed.  Over the past several days however he has gotten increasingly short of breath with exertion.  Patient reports that he now has to take a rest whereas normally he could do more activity.  No chest pain, no fever no productive cough.  No lower extremity swelling or calf pain.  Patient did call his PCP and was advised to come to the emergency department for assessment.  Patient reports has been quite a long time since he has had any prednisone  for his COPD exacerbation.  He reports symptoms typically improve when he does get a treatment with prednisone .    Prior to Admission medications   Medication Sig Start Date End Date Taking? Authorizing Provider  predniSONE  (STERAPRED UNI-PAK 21 TAB) 10 MG (21) TBPK tablet Take by mouth daily. Take 6 tabs by mouth daily  for 2 days, then 5 tabs for 2 days, then 4 tabs for 2 days, then 3 tabs for 2 days, 2 tabs for 2 days, then 1 tab by mouth daily for 2 days 06/04/24  Yes Giovani Neumeister, Ludivina, MD  albuterol  (PROVENTIL ) (2.5 MG/3ML) 0.083% nebulizer solution USE 1 VIAL VIA NEBULIZER FOUR TIMES DAILY AS NEEDED 09/12/23   [name] Dorn NOVAK, MD  albuterol  (VENTOLIN  HFA) 108 (90 Base) MCG/ACT inhaler INHALE 2 PUFFS BY MOUTH EVERY 4 TO 6 HOURS AS NEEDED 03/03/23   [name] Dorn NOVAK, MD  atorvastatin  (LIPITOR) 40 MG tablet TAKE 1 TABLET(40 MG) BY MOUTH DAILY AT 6 PM 08/18/23   Pietro Redell RAMAN, MD  budesonide  (PULMICORT ) 0.5 MG/2ML nebulizer solution Take 2 mLs (0.5 mg total) by nebulization 2 (two) times daily. 01/13/24   [name] Dorn NOVAK, MD  buPROPion  (WELLBUTRIN  XL) 300 MG 24 hr tablet Take 300 mg by mouth every morning.  05/11/16   [provider]  citalopram  (CELEXA ) 40 MG tablet Take 40 mg by mouth every morning. 06/01/16   [provider]  clotrimazole -betamethasone  (LOTRISONE ) cream Apply 1 Application topically 2 (two) times daily. Apply to buttocks and left ankle & heel. 02/05/24   Lucius Krabbe, NP  cyclobenzaprine  (FLEXERIL ) 5 MG tablet Take 1 tablet (5 mg total) by mouth 3 (three) times daily as needed for muscle spasms. 03/23/24   Lucius Krabbe, NP  diphenoxylate -atropine  (LOMOTIL ) 2.5-0.025 MG tablet Take 1-2 tablets by mouth 4 (four) times daily as needed for diarrhea or loose stools (Max dose of 8 tabs in 24 hours). 04/14/24   Lucius Krabbe, NP  Dupilumab  (DUPIXENT ) 300 MG/2ML SOAJ Inject 300 mg into the skin every 14 (fourteen) days. 04/12/24   [name] Dorn NOVAK, MD  famotidine  (PEPCID ) 20 MG tablet One after supper 12/22/23   Darlean Ozell NOVAK, MD  Fluticasone -Umeclidin-Vilant (TRELEGY ELLIPTA ) 200-62.5-25 MCG/ACT AEPB Inhale 1 puff into the lungs daily.    [provider]  glipiZIDE  (GLUCOTROL  XL) 2.5 MG 24 hr tablet TAKE 1 TABLET(2.5 MG) BY MOUTH TWICE DAILY AFTER A MEAL 05/03/24   Hudnell, Krabbe, NP  ipratropium-albuterol  (DUONEB) 0.5-2.5 (3) MG/3ML SOLN Take 3 mLs by nebulization 4 (four) times daily. 12/22/23   Darlean Ozell NOVAK, MD  metoprolol  succinate (TOPROL -XL) 25 MG 24 hr tablet TAKE 1/2  TABLET(12.5 MG) BY MOUTH DAILY 04/21/24   Pietro Redell RAMAN, MD  olmesartan  (BENICAR ) 40 MG tablet Take 1 tablet (40 mg total) by mouth daily. 06/02/23 06/01/24  Darlean Ozell NOVAK, MD  ondansetron  (ZOFRAN ) 4 MG tablet Take 1 tablet (4 mg total) by mouth every 8 (eight) hours as needed for nausea or vomiting. 04/14/24   Lucius Krabbe, NP  pantoprazole  (PROTONIX ) 40 MG tablet One tablet by mouth daily 30 to 60 minutes before the first meal of the day 05/26/24   Darlean Ozell NOVAK, MD  triamcinolone  ointment (KENALOG ) 0.5 % Apply topically 2 (two) times daily. 06/02/24   Lucius Krabbe,  NP    Allergies: Patient has no known allergies.    Review of Systems  Updated Vital Signs BP (!) 138/91 (BP Location: Right Arm)   Pulse 94   Temp 98.1 F (36.7 C) (Temporal)   Resp 18   Ht 5' 8 (1.727 m)   Wt 83 kg   SpO2 100%   BMI 27.82 kg/m   Physical Exam Constitutional:      Comments: Patient is alert nontoxic.  Patient sitting in the chair at the bedside.  Clinically well in appearance.  No significant respiratory distress at rest.  HENT:     Mouth/Throat:     Pharynx: Oropharynx is clear.  Eyes:     Extraocular Movements: Extraocular movements intact.  Cardiovascular:     Rate and Rhythm: Normal rate and regular rhythm.  Pulmonary:     Comments: Scattered forced expiratory wheeze.  Adequate airflow.  No significant crackle. Abdominal:     General: There is no distension.     Palpations: Abdomen is soft.     Tenderness: There is no abdominal tenderness. There is no guarding.  Musculoskeletal:        General: No swelling or tenderness. Normal range of motion.     Right lower leg: No edema.     Left lower leg: No edema.  Skin:    General: Skin is warm and dry.  Neurological:     General: No focal deficit present.     Mental Status: He is oriented to person, place, and time.     Motor: No weakness.     Coordination: Coordination normal.      (all labs ordered are listed, but only abnormal results are displayed) Labs Reviewed  BASIC METABOLIC PANEL WITH GFR - Abnormal; Notable for the following components:      Result Value   Glucose, Bld 106 (*)    BUN 32 (*)    Creatinine, Ser 1.58 (*)    GFR, Estimated 45 (*)    All other components within normal limits  CBC - Abnormal; Notable for the following components:   WBC 12.1 (*)    RBC 4.15 (*)    All other components within normal limits    EKG: EKG Interpretation Date/Time:  Friday June 04 2024 11:34:21 EDT Ventricular Rate:  88 PR Interval:  153 QRS Duration:  136 QT Interval:  376 QTC  Calculation: 455 R Axis:   -50  Text Interpretation: Sinus rhythm RBBB and LAFB no sig change from previous Confirmed by Armenta Canning [phone]) on 06/04/2024 2:02:15 PM  Radiology: DG Chest 2 View Result Date: 06/04/2024 CLINICAL DATA:  Shortness of breath. EXAM: CHEST - 2 VIEW COMPARISON:  11/03/2023. FINDINGS: Bilateral lungs appear hyperlucent with coarse bronchovascular markings, in keeping with COPD. Bilateral lungs otherwise appear clear. No dense consolidation or lung collapse. Bilateral costophrenic angles  are clear. Stable cardio-mediastinal silhouette. No acute osseous abnormalities. The soft tissues are within normal limits. IMPRESSION: No active cardiopulmonary disease. COPD. Electronically Signed   By: Ree Molt M.D.   On: 06/04/2024 11:41     Procedures   Medications Ordered in the ED  predniSONE  (DELTASONE ) tablet 40 mg (has no administration in time range)  ipratropium-albuterol  (DUONEB) 0.5-2.5 (3) MG/3ML nebulizer solution 3 mL (3 mLs Nebulization Given 06/04/24 1408)                                    Medical Decision Making Amount and/or Complexity of Data Reviewed Labs: ordered. Radiology: ordered.  Risk Prescription drug management.  Patient presents as outlined.  He is well in appearance.  At this time we will proceed with chest x-ray and basic lab work.  Low suspicion for ACS.  Patient has not had any chest pain, no lightheadedness, no near syncope, no palpitation.  Low suspicion for PE.  No pleuritic chest pain, no lower extremity swelling or calf tenderness, no immediate risk factors.  GFR 45 stable at baseline.  White count 12.1, this has been consistently mildly elevated most of the year.  H&H stable.  Chest x-ray inter by radiology consistent with COPD no other acute findings.  Findings consistent with COPD exacerbation.  Patient has had good response with steroid therapy previously.  At this time he is not in acute distress.  Appropriate for oral  dose of steroids and DuoNeb.  Will plan to continue a prednisone  taper.     Final diagnoses:  COPD exacerbation [facility])    ED Discharge Orders          Ordered    predniSONE  (STERAPRED UNI-PAK 21 TAB) 10 MG (21) TBPK tablet  Daily        06/04/24 1421               Armenta Canning, MD 06/04/24 1427

## 2024-06-04 NOTE — Discharge Instructions (Signed)
 1.  You have been started on a prednisone  taper.  You can start this tomorrow.  You were given your first dose in the emergency department.  Continue to use your albuterol  nebulizers at home and your COPD baseline medications. 2.  Follow-up with your doctor soon as possible for recheck. 3.  Return to the emergency department if you get any worsening shortness of breath, chest pain, fever or other concerning changes.

## 2024-06-04 NOTE — ED Notes (Signed)
 ED Provider at bedside.

## 2024-06-04 NOTE — Telephone Encounter (Signed)
 Patient called back after being triaged less than an hour ago and states that he will go to the Emergency Room now.   FYI Only or Action Required?: Action required by provider: update on patient condition.  Patient is followed in Pulmonology for COPD, last seen on 01/13/2024 by [name] Dorn NOVAK, MD.  Called Nurse Triage reporting Shortness of Breath.  Symptoms began a week ago.  Triage Disposition: Information or Advice Only Call  Patient/caregiver understands and will follow disposition?: Yes             Copied from CRM #[id number]. Topic: Clinical - Red Word Triage >> Jun 04, 2024 10:19 AM Gary Wright wrote: Red Word that prompted transfer to Nurse Triage: Trouble breathing and shortness of breath. Reason for Disposition  [1] Follow-up call to recent contact AND [2] information only call, no triage required  Answer Assessment - Initial Assessment Questions Patient called back from being triaged earlier and states that he does not want to go to the Emergency Room. With worsening of breathing and sounding short of breath during triage--this RN advised him that the recommendation is still for him to be seen at the Emergency Room. He states that nothing is different from being triaged earlier. He agrees to go be seen at the nearest Emergency Room at this time. He is also advised that 911 is always available if he needs to call them as well. Patient verbalized understanding.  Protocols used: Information Only Call - No Triage-A-AH

## 2024-06-04 NOTE — ED Notes (Signed)
 Reviewed discharge instructions, medications, and home care with pt. Pt verbalized understanding and had no further questions. Pt exited ED without complications.

## 2024-06-04 NOTE — Telephone Encounter (Signed)
 noted

## 2024-06-04 NOTE — Telephone Encounter (Signed)
 FYI Only or Action Required?: Action required by provider: update on patient condition.  Patient is followed in Pulmonology for COPD, last seen on 01/13/2024 by Gary Dorn NOVAK, MD.  Called Nurse Triage reporting Shortness of Breath.  Symptoms began a week ago.  Triage Disposition: Go to ED Now (Notify PCP)  Patient/caregiver understands and will follow disposition?: No                   Copied from CRM #[id number]. Topic: Clinical - Red Word Triage >> Jun 04, 2024  9:32 AM Gary Wright wrote: Red Word that prompted transfer to Nurse Triage: Patient [phone] states for the past week, hard to breathing, more shortness of breath, and wheezing. Patient denies pain, nor fever. Patient wants Prednisone  or something and not go to the hospital. Patient see Dr. [name] and NP, Gary Wright. Please advise.  [id number] [place], [place] - [address] AT [facility] [address] [place] [address] Phone: [phone] Fax: [phone] Reason for Disposition  [1] MODERATE difficulty breathing (e.g., speaks in phrases, SOB even at rest, pulse 100-120) AND [2] NEW-onset or WORSE than normal  Answer Assessment - Initial Assessment Questions This RN recommends pt goes to ED and has another adult drive. Pt refuses and would like prednisone  sent to his pharmacy or to schedule an appointment. This RN attempted to contact [facility] with no answer. This RN will send a high priority message to clinic. Pt call back number is [phone]    RESPIRATORY STATUS: Describe your breathing? (e.g., wheezing, shortness of breath, unable to speak, severe coughing)      Wheezing, SOB mainly with movement (sometimes when sitting), some coughing  ONSET: When did this breathing problem begin?      The past week  PATTERN Does the difficult breathing come and go, or has it been constant since it started?      Pretty constant  SEVERITY: How bad is your breathing? (e.g., mild,  moderate, severe)     Moderate to severe  RECURRENT SYMPTOM: Have you had difficulty breathing before? If Yes, ask: When was the last time? and What happened that time?      Yes, pt states he has had this in past and had to go to hospital   OTHER SYMPTOMS: Do you have any other symptoms? (e.g., chest pain, cough, dizziness, fever, runny nose)     Denies all except a cough  O2 SATURATION MONITOR:  Do you use an oxygen saturation monitor (pulse oximeter) at home? If Yes, ask: What is your reading (oxygen level) today? What is your usual oxygen saturation reading? (e.g., 95%)       Been holding steady 94-98%, does not use oxygen  Protocols used: Breathing Difficulty-A-AH

## 2024-06-09 ENCOUNTER — Ambulatory Visit: Admitting: Pulmonary Disease

## 2024-06-09 ENCOUNTER — Encounter: Payer: Self-pay | Admitting: Pulmonary Disease

## 2024-06-09 ENCOUNTER — Ambulatory Visit: Payer: Self-pay | Admitting: Pulmonary Disease

## 2024-06-09 VITALS — BP 146/80 | HR 106 | Ht 68.0 in | Wt 186.4 lb

## 2024-06-09 DIAGNOSIS — J4489 Other specified chronic obstructive pulmonary disease: Secondary | ICD-10-CM | POA: Diagnosis not present

## 2024-06-09 DIAGNOSIS — J441 Chronic obstructive pulmonary disease with (acute) exacerbation: Secondary | ICD-10-CM

## 2024-06-09 MED ORDER — AZITHROMYCIN 250 MG PO TABS: ORAL_TABLET | ORAL | 0 refills | Status: DC

## 2024-06-09 MED ORDER — ALBUTEROL SULFATE HFA 108 (90 BASE) MCG/ACT IN AERS: 1.0000 | INHALATION_SPRAY | RESPIRATORY_TRACT | 3 refills | Status: DC | PRN

## 2024-06-09 MED ORDER — ALBUTEROL SULFATE (2.5 MG/3ML) 0.083% IN NEBU: 2.5000 mg | INHALATION_SOLUTION | RESPIRATORY_TRACT | 5 refills | Status: DC | PRN

## 2024-06-09 MED ORDER — PREDNISONE 10 MG PO TABS: ORAL_TABLET | ORAL | 0 refills | Status: DC

## 2024-06-09 NOTE — Telephone Encounter (Signed)
 FYI Only or Action Required?: FYI only for provider.  Patient is followed in Pulmonology for asthma-COPD, last seen on 01/13/2024 by Gary Dorn NOVAK, MD.  Called Nurse Triage reporting Shortness of Breath, Hospitalization Follow-up, Wheezing, Chest Pain, Fatigue, and heart rate increased at times.  Symptoms began several weeks ago.  Interventions attempted: Prescription medications: dupixent , Rescue inhaler, Maintenance inhaler, Nebulizer treatments, Increased fluids/rest, and Other: ED on 8/14 prescribed prednisone  - halfway through course.  Symptoms are: overall worsening but can't say worse than when discharged from ED.  Triage Disposition: Call PCP Within 24 Hours  Patient/caregiver understands and will follow disposition?: Yes     Copied from CRM #[id number]. Topic: Clinical - Red Word Triage >> Jun 09, 2024  1:19 PM Chantha C wrote: [facility] that prompted transfer to Nurse Triage: Patient [phone] is trying to set up an appointment with Dr. [name] or Np, [name] for Hospital f/u at [facility] ER in [facility] 06/04/24, patient was not admitted. Patient states get out of breath very easily, wheezing and shortness of breath when trying to do things, if sitting it's ok and tightness in chest. Patient denies fever, nor dizziness. Please advise. Reason for Disposition  [1] Monitoring heart rate AND [2] heart rate > 100 beats per minute  Answer Assessment - Initial Assessment Questions E2C2 Pulmonary Triage - Initial Assessment Questions Chief Complaint (e.g., cough, sob, wheezing, fever, chills, sweat or additional symptoms) *Go to specific symptom protocol after initial questions. Can't tell you it's worse than when discharged but still wheeze and get out of breath so easy ED put me on prednisone , tapering dose, halfway done with it, doesn't seem to be doing too much, helped little bit Chest tightness but not chest pain, his usual with SOB when comes on Feel a little weak, not too weak  to stand, can still walk just get out of breath real easy Was doing really well for quite a while No dizziness, nausea, excessive sweating Wheezing some of the time Cough little bit but not a whole lot, not coughing anything up SOB worse with exertion, but at rest not too bad, little bit harder to breathe overall HR fluctuates 80s-110s, not been getting into 110s more lately  How long have symptoms been present? 2 weeks at least  Have you used your inhalers/maintenance medication? Yes If yes, What medications? Dupixent  Nebulizer 6x/day, 1 is 4x and other is 2x Trelegy Rescue inhaler, using more than normally do Get some relief but not whole lot like normally would, wheezing kind of subsides but comes back after little while with nebulizer  OXYGEN: Do you wear supplemental oxygen? No  Do you monitor your oxygen levels? Yes If yes, What is your reading (oxygen level) today? Little bit lower than normally is, 94%  What is your usual oxygen saturation reading?  (Note: Pulmonary O2 sats should be 90% or greater) 97-98%, fluctuate between 94-97-98%  Protocols used: COPD Post-Hospitalization Follow-up Call-A-AH

## 2024-06-09 NOTE — Progress Notes (Signed)
 Synopsis: Referred in August 2023 for COPD by Victory Hoops, PA  Subjective:   PATIENT ID: Gary Wright GENDER: male DOB: 21-Mar-1946, MRN: [id number]  HPI  Chief Complaint  Patient presents with   Shortness of Breath    Few weeks , nothing has help ED Rx pred. Still taking    Gary Wright is a 78 year old male, former smoker with CAD, hypertension, hyperlipidemia and lung cancer s/p LUL lobectomy 10/30/2018 who returns to pulmonary clinic for COPD.   Initial OV 05/23/22 He was admitted 6/4 to 6/7 for COPD exacerbation. He is currently on Trelegy ellipta  1 puff daily. He is using albuterol  inhaler 1- 2 times per day. He is using albuterol  nebulizer treatments 3 times per day.   He has on going dyspnea. He does have some wheezing intermittently. He does have cough with some phlegm, that is light brown in color.   He has oxygen equipment at home and is not using it. He denies issues with seasonal allergies. He does have heart burn issues more so at night. He is sleeping with head of bed elevated.   He is retired. He lives with his wife. He quit smoking in 2019. He smoked 1.5 pack per day for 20 years.   OV 01/2023 He is on trelegy ellipta  1 puff daily and as needed albuterol  inhaler or nebulizer treatment. He is also on roflumilast  500mcg every other day. He is coughing less and having less mucous production.  OV 09/24/23 He was last seen by Izetta Rouleau, NP 07/28/23. The symptoms have progressed to the point where the patient reports being unable to do much anymore. The patient has found some relief with a higher dose of prednisone , but the relief is only partial. The patient has increased the frequency of nebulizer treatments from twice to three times a day due to the worsening symptoms. The patient has previously been on Trelegy 200, but is currently on Trelegy 100. The patient reports no significant improvement with the use of roflumilast . The patient has a history of smoking but quit in  2019. There is no current exposure to secondhand smoke.  OV 12/18/2023 He is experiencing a recent flare-up of asthma, characterized by increased use of his rescue inhaler and coughing up yellow mucus. He can usually anticipate these flare-ups as they are preceded by these symptoms. During the current episode, he did not require an overnight hospital stay but did receive a shot of medication.  He is currently experiencing increased wheezing, chest tightness, and a significant amount of coughing, with the mucus changing from clear to yellow. No side effects from his current medications.  He started Dupixent  last month and is scheduled to take his third shot next week. He is currently on a daily dose of 15 mg of prednisone . He also uses a Trelegy inhaler, taking one puff daily, and utilizes an albuterol  nebulizer first thing in the morning. Recently, he has had to use his rescue inhaler more frequently than usual.  OV 01/13/24 He has been experiencing weakness, which he attributes to stopping prednisone  after taking it for two to three months due to his prescription running out and no refills being available. He is awaiting a new delivery of his injections and has not had any adverse reactions to them.  He uses Pulmicort  and DuoNeb nebulizer treatments twice daily and has not been using inhalers. He was previously on Trelegy but stopped due to throat irritation. Since starting new nebulizer medications, he initially coughed up  thick yellow mucus, which has since cleared, and he is now experiencing minimal wheezing and coughing.  He is currently taking two medications for reflux, which have alleviated his heartburn. He also has a persistent sore throat and questions the necessity of continuing a 500 mg medication due to a discount running out.  He quit smoking, although his wife smokes but not around him. He ensures adherence to his nebulizer treatment schedule and has the time to complete them as  prescribed.  OV 06/09/24 He experiences significant difficulty breathing over the past two weeks, worsening even at rest. Physical activity exacerbates his dyspnea, causing him to 'fight for his breath.' He denies chest pain but reports chest tightness and no peripheral edema.  He is on a tapering dose of prednisone , initially effective but now less so. He uses his rescue inhaler more frequently and continues nebulizer treatments six times daily, which temporarily relieve wheezing. His medications include Dupixent  every fourteen days and Trelegy 200. A lapse in riflumilast due to a coupon expiration may have impacted his regimen.  He attributes some breathing difficulties to hot and humid weather, which he believes may trigger flare-ups. He feels weaker than usual and is concerned about the rapid tapering of prednisone .  Past Medical History:  Diagnosis Date   Acute dyspnea 03/24/2022   Acute exacerbation of chronic obstructive pulmonary disease (COPD) (HCC) 03/25/2022   CAD (coronary artery disease) of artery bypass graft 06/13/2016   Chest pain    COPD (chronic obstructive pulmonary disease) (HCC)    COPD with acute exacerbation (HCC) 11/02/2020   Quit smokig 2019  - PFT's  121/22/23  FEV1 1.83 (60 % ) ratio 0.57  p 15 % improvement from saba p 0 prior to study with DLCO  16.4 (65%)   and FV curve mildly concave  - try off acei and on otc gerd rx 06/02/2023 >>>       Depression 09/01/2007   Qualifier: Diagnosis of  By: Nickola CMA, [place]     Elevated troponin 06/13/2016   History of lung cancer 11/02/2020   Hyperlipidemia 11/02/2020   Hypertension    Prolonged QT interval 11/02/2020   Stress-induced cardiomyopathy 06/13/2016   TOBACCO ABUSE 09/01/2007   Qualifier: Diagnosis of   By: Nickola CMA, [place]           Family History  Problem Relation Age of Onset   Hypertension Mother    Hypertension Father      Social History   Socioeconomic History   Marital status: Married     Spouse name: Deland   Number of children: 2   Years of education: Not on file   Highest education level: Not on file  Occupational History   Occupation: retired    Associate Professor: [employer]  Tobacco Use   Smoking status: Former    Current packs/day: 0.00    Types: Cigarettes    Quit date: 2019    Years since quitting: 6.6   Smokeless tobacco: Never  Vaping Use   Vaping status: Never Used  Substance and Sexual Activity   Alcohol use: No    Comment: No alcohol for 17 years   Drug use: No   Sexual activity: Not on file  Other Topics Concern   Not on file  Social History Narrative   Married for 27 years with 3 children   Social Drivers of Health   Financial Resource Strain: Low Risk  (11/18/2023)   Overall Financial Resource Strain (CARDIA)    Difficulty  of Paying Living Expenses: Not hard at all  Food Insecurity: No Food Insecurity (11/18/2023)   Hunger Vital Sign    Worried About Running Out of Food in the Last Year: Never true    Ran Out of Food in the Last Year: Never true  Transportation Needs: No Transportation Needs (11/18/2023)   PRAPARE - Administrator, Civil Service (Medical): No    Lack of Transportation (Non-Medical): No  Physical Activity: Insufficiently Active (11/18/2023)   Exercise Vital Sign    Days of Exercise per Week: 3 days    Minutes of Exercise per Session: 10 min  Stress: No Stress Concern Present (11/18/2023)   [employer] of Occupational Health - Occupational Stress Questionnaire    Feeling of Stress : Not at all  Social Connections: Moderately Isolated (11/18/2023)   Social Connection and Isolation Panel    Frequency of Communication with Friends and Family: Three times a week    Frequency of Social Gatherings with Friends and Family: Never    Attends Religious Services: Never    Database administrator or Organizations: No    Attends Banker Meetings: Never    Marital Status: Married  Catering manager Violence: Not  At Risk (11/18/2023)   Humiliation, Afraid, Rape, and Kick questionnaire    Fear of Current or Ex-Partner: No    Emotionally Abused: No    Physically Abused: No    Sexually Abused: No     No Known Allergies   Outpatient Medications Prior to Visit  Medication Sig Dispense Refill   albuterol  (PROVENTIL ) (2.5 MG/3ML) 0.083% nebulizer solution USE 1 VIAL VIA NEBULIZER FOUR TIMES DAILY AS NEEDED 360 mL 2   albuterol  (VENTOLIN  HFA) 108 (90 Base) MCG/ACT inhaler INHALE 2 PUFFS BY MOUTH EVERY 4 TO 6 HOURS AS NEEDED 20.1 g 1   atorvastatin  (LIPITOR) 40 MG tablet TAKE 1 TABLET(40 MG) BY MOUTH DAILY AT 6 PM 90 tablet 3   budesonide  (PULMICORT ) 0.5 MG/2ML nebulizer solution Take 2 mLs (0.5 mg total) by nebulization 2 (two) times daily. 120 mL 11   buPROPion  (WELLBUTRIN  XL) 300 MG 24 hr tablet Take 300 mg by mouth every morning.  1   citalopram  (CELEXA ) 40 MG tablet Take 40 mg by mouth every morning.  0   clotrimazole -betamethasone  (LOTRISONE ) cream Apply 1 Application topically 2 (two) times daily. Apply to buttocks and left ankle & heel. 45 g 2   cyclobenzaprine  (FLEXERIL ) 5 MG tablet Take 1 tablet (5 mg total) by mouth 3 (three) times daily as needed for muscle spasms. 30 tablet 5   diphenoxylate -atropine  (LOMOTIL ) 2.5-0.025 MG tablet Take 1-2 tablets by mouth 4 (four) times daily as needed for diarrhea or loose stools (Max dose of 8 tabs in 24 hours). 30 tablet 0   Dupilumab  (DUPIXENT ) 300 MG/2ML SOAJ Inject 300 mg into the skin every 14 (fourteen) days. 12 mL 0   famotidine  (PEPCID ) 20 MG tablet One after supper 30 tablet 11   Fluticasone -Umeclidin-Vilant (TRELEGY ELLIPTA ) 200-62.5-25 MCG/ACT AEPB Inhale 1 puff into the lungs daily.     glipiZIDE  (GLUCOTROL  XL) 2.5 MG 24 hr tablet TAKE 1 TABLET(2.5 MG) BY MOUTH TWICE DAILY AFTER A MEAL 60 tablet 2   ipratropium-albuterol  (DUONEB) 0.5-2.5 (3) MG/3ML SOLN Take 3 mLs by nebulization 4 (four) times daily. 360 mL 11   metoprolol  succinate (TOPROL -XL) 25  MG 24 hr tablet TAKE 1/2 TABLET(12.5 MG) BY MOUTH DAILY 45 tablet 0   olmesartan  (BENICAR ) 40 MG  tablet Take 1 tablet (40 mg total) by mouth daily. 30 tablet 11   ondansetron  (ZOFRAN ) 4 MG tablet Take 1 tablet (4 mg total) by mouth every 8 (eight) hours as needed for nausea or vomiting. 20 tablet 0   pantoprazole  (PROTONIX ) 40 MG tablet One tablet by mouth daily 30 to 60 minutes before the first meal of the day 90 tablet 1   predniSONE  (DELTASONE ) 10 MG tablet Take by mouth daily. Take 6 tabs by mouth daily  for 2 days, then 5 tabs for 2 days, then 4 tabs for 2 days, then 3 tabs for 2 days, 2 tabs for 2 days, then 1 tab by mouth daily for 2 days 42 tablet 0   triamcinolone  ointment (KENALOG ) 0.5 % Apply topically 2 (two) times daily. 30 g 0   No facility-administered medications prior to visit.    Review of Systems  Constitutional:  Negative for chills, fever, malaise/fatigue and weight loss.  HENT:  Negative for congestion, sinus pain and sore throat.   Eyes: Negative.   Respiratory:  Positive for cough, sputum production and shortness of breath. Negative for hemoptysis.   Cardiovascular:  Negative for chest pain, palpitations, orthopnea, claudication and leg swelling.  Gastrointestinal:  Negative for abdominal pain, heartburn, nausea and vomiting.  Genitourinary: Negative.   Musculoskeletal:  Negative for joint pain and myalgias.  Skin:  Negative for rash.  Neurological:  Negative for weakness.  Endo/Heme/Allergies: Negative.    Objective:   Vitals:   06/09/24 1408  BP: (!) 146/80  Pulse: (!) 106  SpO2: 97%  Weight: 186 lb 6.4 oz (84.6 kg)  Height: 5' 8 (1.727 m)    Physical Exam Constitutional:      General: He is not in acute distress. HENT:     Head: Normocephalic and atraumatic.  Eyes:     Conjunctiva/sclera: Conjunctivae normal.  Cardiovascular:     Rate and Rhythm: Normal rate and regular rhythm.     Pulses: Normal pulses.     Heart sounds: Normal heart sounds. No  murmur heard. Pulmonary:     Effort: Pulmonary effort is normal.     Breath sounds: No wheezing or rhonchi.  Musculoskeletal:     Right lower leg: No edema.     Left lower leg: No edema.  Skin:    General: Skin is warm and dry.  Neurological:     General: No focal deficit present.     Mental Status: He is alert.    CBC    Component Value Date/Time   WBC 12.1 (H) 06/04/2024 1132   RBC 4.15 (L) 06/04/2024 1132   HGB 13.1 06/04/2024 1132   HCT 40.5 06/04/2024 1132   PLT 281 06/04/2024 1132   MCV 97.6 06/04/2024 1132   MCH 31.6 06/04/2024 1132   MCHC 32.3 06/04/2024 1132   RDW 13.2 06/04/2024 1132   LYMPHSABS 3.6 01/29/2024 1433   MONOABS 1.0 01/29/2024 1433   EOSABS 0.2 01/29/2024 1433   BASOSABS 0.0 01/29/2024 1433      Latest Ref Rng & Units 06/04/2024   11:32 AM 04/15/2024    2:27 AM 04/14/2024    6:30 PM  BMP  Glucose 70 - 99 mg/dL 893  855  898   BUN 8 - 23 mg/dL 32  25  28   Creatinine 0.61 - 1.24 mg/dL 8.41  8.46  8.39   Sodium 135 - 145 mmol/L 141  138  137   Potassium 3.5 - 5.1 mmol/L 4.5  4.2  4.3   Chloride 98 - 111 mmol/L 104  105  102   CO2 22 - 32 mmol/L 24  24  25    Calcium  8.9 - 10.3 mg/dL 9.4  8.5  8.6    Chest imaging: CT Chest LCS 2024 Mediastinum/Nodes: No pathologically enlarged mediastinal or axillary lymph nodes. Hilar regions are difficult to definitively evaluate without IV contrast. There may be distal esophageal wall thickening which can be seen with gastroesophageal reflux disease.   Lungs/Pleura: Centrilobular and paraseptal emphysema. Left upper lobectomy. Calcified left lower lobe granuloma. No pleural fluid. Airway is otherwise unremarkable.  CXR 09/11/22 Heart size and mediastinal contours are unremarkable. Postop change from left upper lobectomy. There is no pleural effusion or edema identified. No airspace opacities identified. The visualized osseous structures are notable for bilateral glenohumeral joint osteoarthritis and  thoracic spondylosis.  PFT:    Latest Ref Rng & Units 09/11/2022    9:47 AM  PFT Results  FVC-Pre L 2.96   FVC-Predicted Pre % 70   FVC-Post L 3.22   FVC-Predicted Post % 76   Pre FEV1/FVC % % 54   Post FEV1/FCV % % 57   FEV1-Pre L 1.58   FEV1-Predicted Pre % 52   FEV1-Post L 1.83   DLCO uncorrected ml/min/mmHg 16.42   DLCO UNC% % 65   DLCO corrected ml/min/mmHg 16.42   DLCO COR %Predicted % 65   DLVA Predicted % 78   TLC L 6.78   TLC % Predicted % 96   RV % Predicted % 132     Labs:  Path:  Echo 03/24/22: LV EF 40-45%. RV systolic function and size is normal.   Heart Catheterization:  Assessment & Plan:   No diagnosis found.  Discussion: Gary Wright is a 78 year old male, former smoker with CAD, hypertension, hyperlipidemia and lung cancer s/p LUL lobectomy 10/30/2018 who returns to pulmonary clinic for asthma-COPD.   Asthma-Chronic Obstructive Pulmonary Disease (COPD) He is having possible flare in his breathing with increased chest tightness, cough and need for albuterol .  - Initiate longer steroid taper starting at 40 mg for three days, then taper every three days for 12 days. - Prescribe azithromycin  (Z-Pak). - Continue nebulizer treatments six times daily. - Advise using nebulizer before Trelegy. - Provide inhaler refills.  Follow-up in 3 months  Dorn Chill, MD [facility] Office: [phone]   Current Outpatient Medications:    albuterol  (PROVENTIL ) (2.5 MG/3ML) 0.083% nebulizer solution, USE 1 VIAL VIA NEBULIZER FOUR TIMES DAILY AS NEEDED, Disp: 360 mL, Rfl: 2   albuterol  (VENTOLIN  HFA) 108 (90 Base) MCG/ACT inhaler, INHALE 2 PUFFS BY MOUTH EVERY 4 TO 6 HOURS AS NEEDED, Disp: 20.1 g, Rfl: 1   atorvastatin  (LIPITOR) 40 MG tablet, TAKE 1 TABLET(40 MG) BY MOUTH DAILY AT 6 PM, Disp: 90 tablet, Rfl: 3   budesonide  (PULMICORT ) 0.5 MG/2ML nebulizer solution, Take 2 mLs (0.5 mg total) by nebulization 2 (two) times daily., Disp:  120 mL, Rfl: 11   buPROPion  (WELLBUTRIN  XL) 300 MG 24 hr tablet, Take 300 mg by mouth every morning., Disp: , Rfl: 1   citalopram  (CELEXA ) 40 MG tablet, Take 40 mg by mouth every morning., Disp: , Rfl: 0   clotrimazole -betamethasone  (LOTRISONE ) cream, Apply 1 Application topically 2 (two) times daily. Apply to buttocks and left ankle & heel., Disp: 45 g, Rfl: 2   cyclobenzaprine  (FLEXERIL ) 5 MG tablet, Take 1 tablet (5 mg total) by mouth 3 (three) times  daily as needed for muscle spasms., Disp: 30 tablet, Rfl: 5   diphenoxylate -atropine  (LOMOTIL ) 2.5-0.025 MG tablet, Take 1-2 tablets by mouth 4 (four) times daily as needed for diarrhea or loose stools (Max dose of 8 tabs in 24 hours)., Disp: 30 tablet, Rfl: 0   Dupilumab  (DUPIXENT ) 300 MG/2ML SOAJ, Inject 300 mg into the skin every 14 (fourteen) days., Disp: 12 mL, Rfl: 0   famotidine  (PEPCID ) 20 MG tablet, One after supper, Disp: 30 tablet, Rfl: 11   Fluticasone -Umeclidin-Vilant (TRELEGY ELLIPTA ) 200-62.5-25 MCG/ACT AEPB, Inhale 1 puff into the lungs daily., Disp: , Rfl:    glipiZIDE  (GLUCOTROL  XL) 2.5 MG 24 hr tablet, TAKE 1 TABLET(2.5 MG) BY MOUTH TWICE DAILY AFTER A MEAL, Disp: 60 tablet, Rfl: 2   ipratropium-albuterol  (DUONEB) 0.5-2.5 (3) MG/3ML SOLN, Take 3 mLs by nebulization 4 (four) times daily., Disp: 360 mL, Rfl: 11   metoprolol  succinate (TOPROL -XL) 25 MG 24 hr tablet, TAKE 1/2 TABLET(12.5 MG) BY MOUTH DAILY, Disp: 45 tablet, Rfl: 0   olmesartan  (BENICAR ) 40 MG tablet, Take 1 tablet (40 mg total) by mouth daily., Disp: 30 tablet, Rfl: 11   ondansetron  (ZOFRAN ) 4 MG tablet, Take 1 tablet (4 mg total) by mouth every 8 (eight) hours as needed for nausea or vomiting., Disp: 20 tablet, Rfl: 0   pantoprazole  (PROTONIX ) 40 MG tablet, One tablet by mouth daily 30 to 60 minutes before the first meal of the day, Disp: 90 tablet, Rfl: 1   predniSONE  (DELTASONE ) 10 MG tablet, Take by mouth daily. Take 6 tabs by mouth daily  for 2 days, then 5 tabs for  2 days, then 4 tabs for 2 days, then 3 tabs for 2 days, 2 tabs for 2 days, then 1 tab by mouth daily for 2 days, Disp: 42 tablet, Rfl: 0   triamcinolone  ointment (KENALOG ) 0.5 %, Apply topically 2 (two) times daily., Disp: 30 g, Rfl: 0

## 2024-06-09 NOTE — Patient Instructions (Addendum)
 Continue trelegy ellipta  1 puff daily  Use albuterol  inhaler 1-2 puffs every 4-6 hours as needed  Start high dose steroid taper  Start Zpak antibiotic  Follow up in 3 months, call sooner if needed

## 2024-06-09 NOTE — Telephone Encounter (Signed)
 Pt is seeing Dr [name] today. NFN

## 2024-06-12 ENCOUNTER — Observation Stay (HOSPITAL_COMMUNITY)
Admission: EM | Admit: 2024-06-12 | Discharge: 2024-06-14 | Disposition: A | Discharge status: Home / Self Care | Attending: Internal Medicine | Admitting: Internal Medicine

## 2024-06-12 ENCOUNTER — Encounter (HOSPITAL_COMMUNITY): Payer: Self-pay

## 2024-06-12 ENCOUNTER — Emergency Department (HOSPITAL_COMMUNITY)

## 2024-06-12 ENCOUNTER — Other Ambulatory Visit: Payer: Self-pay

## 2024-06-12 DIAGNOSIS — J449 Chronic obstructive pulmonary disease, unspecified: Secondary | ICD-10-CM | POA: Diagnosis not present

## 2024-06-12 DIAGNOSIS — I129 Hypertensive chronic kidney disease with stage 1 through stage 4 chronic kidney disease, or unspecified chronic kidney disease: Secondary | ICD-10-CM | POA: Diagnosis not present

## 2024-06-12 DIAGNOSIS — A419 Sepsis, unspecified organism: Secondary | ICD-10-CM | POA: Diagnosis not present

## 2024-06-12 DIAGNOSIS — Z79899 Other long term (current) drug therapy: Secondary | ICD-10-CM | POA: Diagnosis not present

## 2024-06-12 DIAGNOSIS — I2510 Atherosclerotic heart disease of native coronary artery without angina pectoris: Secondary | ICD-10-CM | POA: Insufficient documentation

## 2024-06-12 DIAGNOSIS — D649 Anemia, unspecified: Secondary | ICD-10-CM | POA: Diagnosis not present

## 2024-06-12 DIAGNOSIS — N390 Urinary tract infection, site not specified: Secondary | ICD-10-CM | POA: Diagnosis not present

## 2024-06-12 DIAGNOSIS — F32A Depression, unspecified: Secondary | ICD-10-CM | POA: Diagnosis present

## 2024-06-12 DIAGNOSIS — E1122 Type 2 diabetes mellitus with diabetic chronic kidney disease: Secondary | ICD-10-CM | POA: Insufficient documentation

## 2024-06-12 DIAGNOSIS — R0602 Shortness of breath: Secondary | ICD-10-CM

## 2024-06-12 DIAGNOSIS — I10 Essential (primary) hypertension: Secondary | ICD-10-CM | POA: Diagnosis present

## 2024-06-12 DIAGNOSIS — E785 Hyperlipidemia, unspecified: Secondary | ICD-10-CM | POA: Diagnosis not present

## 2024-06-12 DIAGNOSIS — N1832 Chronic kidney disease, stage 3b: Secondary | ICD-10-CM | POA: Insufficient documentation

## 2024-06-12 DIAGNOSIS — R7303 Prediabetes: Secondary | ICD-10-CM

## 2024-06-12 DIAGNOSIS — J441 Chronic obstructive pulmonary disease with (acute) exacerbation: Secondary | ICD-10-CM | POA: Diagnosis not present

## 2024-06-12 DIAGNOSIS — Z1389 Encounter for screening for other disorder: Secondary | ICD-10-CM | POA: Diagnosis not present

## 2024-06-12 DIAGNOSIS — K219 Gastro-esophageal reflux disease without esophagitis: Secondary | ICD-10-CM | POA: Diagnosis not present

## 2024-06-12 DIAGNOSIS — Z794 Long term (current) use of insulin: Secondary | ICD-10-CM | POA: Diagnosis not present

## 2024-06-12 DIAGNOSIS — Z951 Presence of aortocoronary bypass graft: Secondary | ICD-10-CM | POA: Diagnosis not present

## 2024-06-12 DIAGNOSIS — D72829 Elevated white blood cell count, unspecified: Secondary | ICD-10-CM | POA: Diagnosis not present

## 2024-06-12 HISTORY — DX: Unspecified asthma, uncomplicated: J45.909

## 2024-06-12 HISTORY — DX: Shortness of breath: R06.02

## 2024-06-12 LAB — URINALYSIS, ROUTINE W REFLEX MICROSCOPIC
Bilirubin Urine: NEGATIVE
Glucose, UA: NEGATIVE mg/dL
Ketones, ur: NEGATIVE mg/dL
Nitrite: NEGATIVE
Protein, ur: NEGATIVE mg/dL
Specific Gravity, Urine: 1.013 (ref 1.005–1.030)
pH: 5 (ref 5.0–8.0)

## 2024-06-12 LAB — CBC
HCT: 38.7 % — ABNORMAL LOW (ref 39.0–52.0)
Hemoglobin: 12.6 g/dL — ABNORMAL LOW (ref 13.0–17.0)
MCH: 31.7 pg (ref 26.0–34.0)
MCHC: 32.6 g/dL (ref 30.0–36.0)
MCV: 97.2 fL (ref 80.0–100.0)
Platelets: 249 K/uL (ref 150–400)
RBC: 3.98 MIL/uL — ABNORMAL LOW (ref 4.22–5.81)
RDW: 13.6 % (ref 11.5–15.5)
WBC: 14.2 K/uL — ABNORMAL HIGH (ref 4.0–10.5)
nRBC: 0 % (ref 0.0–0.2)

## 2024-06-12 LAB — I-STAT CG4 LACTIC ACID, ED
Lactic Acid, Venous: 3.8 mmol/L (ref 0.5–1.9)
Lactic Acid, Venous: 3.2 mmol/L (ref 0.5–1.9)

## 2024-06-12 LAB — TROPONIN I (HIGH SENSITIVITY)
Troponin I (High Sensitivity): 10 ng/L (ref <18)
Troponin I (High Sensitivity): 9 ng/L (ref <18)
Troponin I (High Sensitivity): 7 ng/L (ref <18)
Troponin I (High Sensitivity): 6 ng/L (ref <18)

## 2024-06-12 LAB — RESP PANEL BY RT-PCR (RSV, FLU A&B, COVID)  RVPGX2
Influenza A by PCR: NEGATIVE
Influenza B by PCR: NEGATIVE
Resp Syncytial Virus by PCR: NEGATIVE
SARS Coronavirus 2 by RT PCR: NEGATIVE

## 2024-06-12 LAB — GLUCOSE, CAPILLARY
Glucose-Capillary: 323 mg/dL — ABNORMAL HIGH (ref 70–99)
Glucose-Capillary: 178 mg/dL — ABNORMAL HIGH (ref 70–99)

## 2024-06-12 LAB — LACTIC ACID, PLASMA
Lactic Acid, Venous: 3.7 mmol/L (ref 0.5–1.9)
Lactic Acid, Venous: 3.1 mmol/L (ref 0.5–1.9)

## 2024-06-12 LAB — HEPATIC FUNCTION PANEL
ALT: 21 U/L (ref 0–44)
AST: 19 U/L (ref 15–41)
Albumin: 2.7 g/dL — ABNORMAL LOW (ref 3.5–5.0)
Alkaline Phosphatase: 63 U/L (ref 38–126)
Bilirubin, Direct: <0.1 mg/dL (ref 0.0–0.2)
Total Bilirubin: 0.6 mg/dL (ref 0.0–1.2)
Total Protein: 4.8 g/dL — ABNORMAL LOW (ref 6.5–8.1)

## 2024-06-12 LAB — BRAIN NATRIURETIC PEPTIDE: B Natriuretic Peptide: 224.1 pg/mL — ABNORMAL HIGH (ref 0.0–100.0)

## 2024-06-12 LAB — HEMOGLOBIN A1C
Hgb A1c MFr Bld: 6.1 % — ABNORMAL HIGH (ref 4.8–5.6)
Mean Plasma Glucose: 128.37 mg/dL

## 2024-06-12 LAB — BASIC METABOLIC PANEL WITH GFR
Anion gap: 12 (ref 5–15)
BUN: 27 mg/dL — ABNORMAL HIGH (ref 8–23)
CO2: 23 mmol/L (ref 22–32)
Calcium: 8.9 mg/dL (ref 8.9–10.3)
Chloride: 102 mmol/L (ref 98–111)
Creatinine, Ser: 1.36 mg/dL — ABNORMAL HIGH (ref 0.61–1.24)
GFR, Estimated: 54 mL/min — ABNORMAL LOW (ref >60)
Glucose, Bld: 154 mg/dL — ABNORMAL HIGH (ref 70–99)
Potassium: 4.2 mmol/L (ref 3.5–5.1)
Sodium: 137 mmol/L (ref 135–145)

## 2024-06-12 LAB — TSH: TSH: 3.713 u[IU]/mL (ref 0.350–4.500)

## 2024-06-12 LAB — C-REACTIVE PROTEIN: CRP: 0.6 mg/dL (ref <1.0)

## 2024-06-12 LAB — MAGNESIUM: Magnesium: 2.1 mg/dL (ref 1.7–2.4)

## 2024-06-12 LAB — PROCALCITONIN: Procalcitonin: 0.71 ng/mL

## 2024-06-12 MED ORDER — ALBUTEROL SULFATE (2.5 MG/3ML) 0.083% IN NEBU: 2.5000 mg | INHALATION_SOLUTION | RESPIRATORY_TRACT | Status: DC | PRN

## 2024-06-12 MED ORDER — SODIUM CHLORIDE 0.9% FLUSH
3.0000 mL | Freq: Two times a day (BID) | INTRAVENOUS | Status: DC
  Administered 2024-06-12 – 2024-06-14 (×4): 3 mL via INTRAVENOUS

## 2024-06-12 MED ORDER — SODIUM CHLORIDE 0.9 % IV SOLN: INTRAVENOUS | Status: DC

## 2024-06-12 MED ORDER — LACTATED RINGERS IV BOLUS (SEPSIS)
2000.0000 mL | Freq: Once | INTRAVENOUS | Status: AC
  Administered 2024-06-12: 2000 mL via INTRAVENOUS

## 2024-06-12 MED ORDER — ACETAMINOPHEN 325 MG PO TABS: 650.0000 mg | ORAL_TABLET | Freq: Four times a day (QID) | ORAL | Status: DC | PRN

## 2024-06-12 MED ORDER — MELATONIN 3 MG PO TABS
3.0000 mg | ORAL_TABLET | Freq: Every evening | ORAL | Status: DC | PRN
  Administered 2024-06-12 – 2024-06-13 (×2): 3 mg via ORAL
  Filled 2024-06-12 (×2): qty 1

## 2024-06-12 MED ORDER — SODIUM CHLORIDE 0.9 % IV SOLN
2.0000 g | INTRAVENOUS | Status: DC
  Administered 2024-06-13 – 2024-06-14 (×2): 2 g via INTRAVENOUS
  Filled 2024-06-12 (×2): qty 20

## 2024-06-12 MED ORDER — ACETAMINOPHEN 650 MG RE SUPP: 650.0000 mg | Freq: Four times a day (QID) | RECTAL | Status: DC | PRN

## 2024-06-12 MED ORDER — CITALOPRAM HYDROBROMIDE 20 MG PO TABS
40.0000 mg | ORAL_TABLET | Freq: Every morning | ORAL | Status: DC
  Administered 2024-06-13 – 2024-06-14 (×2): 40 mg via ORAL
  Filled 2024-06-12 (×2): qty 2

## 2024-06-12 MED ORDER — INSULIN ASPART 100 UNIT/ML IJ SOLN
0.0000 [IU] | Freq: Three times a day (TID) | INTRAMUSCULAR | Status: DC
  Administered 2024-06-12: 11 [IU] via SUBCUTANEOUS

## 2024-06-12 MED ORDER — SODIUM CHLORIDE 0.9 % IV SOLN: 500.0000 mg | INTRAVENOUS | Status: DC

## 2024-06-12 MED ORDER — SODIUM CHLORIDE 0.9 % IV SOLN
2.0000 g | Freq: Once | INTRAVENOUS | Status: AC
  Administered 2024-06-12: 2 g via INTRAVENOUS
  Filled 2024-06-12: qty 20

## 2024-06-12 MED ORDER — LACTATED RINGERS IV SOLN: INTRAVENOUS | Status: DC

## 2024-06-12 MED ORDER — NITROGLYCERIN 0.4 MG SL SUBL: 0.4000 mg | SUBLINGUAL_TABLET | SUBLINGUAL | Status: DC | PRN

## 2024-06-12 MED ORDER — AZITHROMYCIN 500 MG PO TABS
500.0000 mg | ORAL_TABLET | Freq: Every day | ORAL | Status: DC
  Administered 2024-06-13 – 2024-06-14 (×2): 500 mg via ORAL
  Filled 2024-06-12 (×2): qty 1

## 2024-06-12 MED ORDER — HYDRALAZINE HCL 20 MG/ML IJ SOLN: 5.0000 mg | INTRAMUSCULAR | Status: DC | PRN

## 2024-06-12 MED ORDER — SODIUM CHLORIDE 0.9 % IV SOLN: 250.0000 mL | INTRAVENOUS | Status: AC | PRN

## 2024-06-12 MED ORDER — PREDNISONE 20 MG PO TABS
40.0000 mg | ORAL_TABLET | Freq: Every day | ORAL | Status: DC
  Administered 2024-06-13 – 2024-06-14 (×2): 40 mg via ORAL
  Filled 2024-06-12 (×2): qty 2

## 2024-06-12 MED ORDER — IPRATROPIUM-ALBUTEROL 0.5-2.5 (3) MG/3ML IN SOLN
3.0000 mL | Freq: Four times a day (QID) | RESPIRATORY_TRACT | Status: DC
  Administered 2024-06-12 – 2024-06-13 (×3): 3 mL via RESPIRATORY_TRACT
  Filled 2024-06-12 (×4): qty 3

## 2024-06-12 MED ORDER — AZITHROMYCIN 250 MG PO TABS: 500.0000 mg | ORAL_TABLET | Freq: Every day | ORAL | Status: DC

## 2024-06-12 MED ORDER — IOHEXOL 350 MG/ML SOLN
75.0000 mL | Freq: Once | INTRAVENOUS | Status: AC | PRN
  Administered 2024-06-12: 75 mL via INTRAVENOUS

## 2024-06-12 MED ORDER — ATORVASTATIN CALCIUM 40 MG PO TABS
40.0000 mg | ORAL_TABLET | Freq: Every day | ORAL | Status: DC
  Administered 2024-06-12 – 2024-06-14 (×3): 40 mg via ORAL
  Filled 2024-06-12 (×3): qty 1

## 2024-06-12 MED ORDER — GUAIFENESIN ER 600 MG PO TB12: 600.0000 mg | ORAL_TABLET | Freq: Two times a day (BID) | ORAL | Status: DC | PRN

## 2024-06-12 MED ORDER — SODIUM CHLORIDE 0.9 % IV BOLUS
500.0000 mL | Freq: Once | INTRAVENOUS | Status: AC
  Administered 2024-06-12: 500 mL via INTRAVENOUS

## 2024-06-12 MED ORDER — HEPARIN SODIUM (PORCINE) 5000 UNIT/ML IJ SOLN
5000.0000 [IU] | Freq: Three times a day (TID) | INTRAMUSCULAR | Status: DC
  Administered 2024-06-12 – 2024-06-14 (×5): 5000 [IU] via SUBCUTANEOUS
  Filled 2024-06-12 (×6): qty 1

## 2024-06-12 MED ORDER — ALBUTEROL SULFATE (2.5 MG/3ML) 0.083% IN NEBU: 2.5000 mg | INHALATION_SOLUTION | Freq: Four times a day (QID) | RESPIRATORY_TRACT | Status: DC | PRN

## 2024-06-12 MED ORDER — SODIUM CHLORIDE 0.9 % IV SOLN
500.0000 mg | Freq: Once | INTRAVENOUS | Status: DC
  Administered 2024-06-12: 500 mg via INTRAVENOUS
  Filled 2024-06-12: qty 5

## 2024-06-12 MED ORDER — PANTOPRAZOLE SODIUM 40 MG IV SOLR
40.0000 mg | Freq: Two times a day (BID) | INTRAVENOUS | Status: DC
  Administered 2024-06-12 – 2024-06-13 (×2): 40 mg via INTRAVENOUS
  Filled 2024-06-12 (×3): qty 10

## 2024-06-12 MED ORDER — BUPROPION HCL ER (XL) 150 MG PO TB24
300.0000 mg | ORAL_TABLET | Freq: Every morning | ORAL | Status: DC
  Administered 2024-06-13 – 2024-06-14 (×2): 300 mg via ORAL
  Filled 2024-06-12 (×2): qty 2

## 2024-06-12 MED ORDER — SODIUM CHLORIDE 0.9% FLUSH: 3.0000 mL | INTRAVENOUS | Status: DC | PRN

## 2024-06-12 NOTE — ED Notes (Signed)
 Pt transported to Inpatient room by Transport.

## 2024-06-12 NOTE — ED Notes (Signed)
 ED Provider at bedside.

## 2024-06-12 NOTE — ED Provider Notes (Signed)
 [name] AT [facility] Provider Note   CSN: [id number] Arrival date & time: 06/12/24  9255     Patient presents with: Shortness of Breath   Gary Wright is a 78 y.o. male.   HPI   78 year old male with past medical history of COPD, CAD presents to the emergency department with concern for ongoing shortness of breath.  Patient was seen by a previous ER and pulmonologist, diagnosed with COPD exacerbation.  Has been getting treatment and is currently on steroid 2 taper in addition to his daily medications.  Patient admits to feeling fatigued and feverish but denies any productive cough, documented fever.  He feels like his chest is tight but denies any sharp pain, no back pain.  Feels like his ankles are starting to swell but denies any unilateral leg swelling.  No history of DVT/PE.  No recent sick contacts or traveling.  He said a decreased appetite with mild nausea but no vomiting or diarrhea.  Prior to Admission medications   Medication Sig Start Date End Date Taking? Authorizing Provider  albuterol  (PROVENTIL ) (2.5 MG/3ML) 0.083% nebulizer solution Take 3 mLs (2.5 mg total) by nebulization every 4 (four) hours as needed for wheezing or shortness of breath. 06/09/24   [name] Dorn NOVAK, MD  albuterol  (VENTOLIN  HFA) 108 (90 Base) MCG/ACT inhaler Inhale 1-2 puffs into the lungs every 4 (four) hours as needed for wheezing or shortness of breath. 06/09/24   [name] Dorn NOVAK, MD  atorvastatin  (LIPITOR) 40 MG tablet TAKE 1 TABLET(40 MG) BY MOUTH DAILY AT 6 PM 08/18/23   Pietro Redell RAMAN, MD  azithromycin  (ZITHROMAX ) 250 MG tablet Take as directed 06/09/24   [name] Dorn NOVAK, MD  budesonide  (PULMICORT ) 0.5 MG/2ML nebulizer solution Take 2 mLs (0.5 mg total) by nebulization 2 (two) times daily. 01/13/24   [name] Dorn NOVAK, MD  buPROPion  (WELLBUTRIN  XL) 300 MG 24 hr tablet Take 300 mg by mouth every morning. 05/11/16   [provider]  citalopram   (CELEXA ) 40 MG tablet Take 40 mg by mouth every morning. 06/01/16   [provider]  clotrimazole -betamethasone  (LOTRISONE ) cream Apply 1 Application topically 2 (two) times daily. Apply to buttocks and left ankle & heel. 02/05/24   Lucius Krabbe, NP  cyclobenzaprine  (FLEXERIL ) 5 MG tablet Take 1 tablet (5 mg total) by mouth 3 (three) times daily as needed for muscle spasms. 03/23/24   Lucius Krabbe, NP  diphenoxylate -atropine  (LOMOTIL ) 2.5-0.025 MG tablet Take 1-2 tablets by mouth 4 (four) times daily as needed for diarrhea or loose stools (Max dose of 8 tabs in 24 hours). 04/14/24   Lucius Krabbe, NP  Dupilumab  (DUPIXENT ) 300 MG/2ML SOAJ Inject 300 mg into the skin every 14 (fourteen) days. 04/12/24   [name] Dorn NOVAK, MD  famotidine  (PEPCID ) 20 MG tablet One after supper 12/22/23   Darlean Ozell NOVAK, MD  Fluticasone -Umeclidin-Vilant (TRELEGY ELLIPTA ) 200-62.5-25 MCG/ACT AEPB Inhale 1 puff into the lungs daily.    [provider]  glipiZIDE  (GLUCOTROL  XL) 2.5 MG 24 hr tablet TAKE 1 TABLET(2.5 MG) BY MOUTH TWICE DAILY AFTER A MEAL 05/03/24   Hudnell, Krabbe, NP  ipratropium-albuterol  (DUONEB) 0.5-2.5 (3) MG/3ML SOLN Take 3 mLs by nebulization 4 (four) times daily. 12/22/23   Darlean Ozell NOVAK, MD  metoprolol  succinate (TOPROL -XL) 25 MG 24 hr tablet TAKE 1/2 TABLET(12.5 MG) BY MOUTH DAILY 04/21/24   Pietro Redell RAMAN, MD  olmesartan  (BENICAR ) 40 MG tablet Take 1 tablet (40 mg total) by mouth daily. 06/02/23  06/09/24  Darlean Ozell NOVAK, MD  ondansetron  (ZOFRAN ) 4 MG tablet Take 1 tablet (4 mg total) by mouth every 8 (eight) hours as needed for nausea or vomiting. 04/14/24   Lucius Krabbe, NP  pantoprazole  (PROTONIX ) 40 MG tablet One tablet by mouth daily 30 to 60 minutes before the first meal of the day 05/26/24   Darlean Ozell NOVAK, MD  predniSONE  (DELTASONE ) 10 MG tablet Take 4 tablets (40 mg total) by mouth daily with breakfast for 3 days, THEN 3 tablets (30 mg total) daily with  breakfast for 3 days, THEN 2 tablets (20 mg total) daily with breakfast for 3 days, THEN 1 tablet (10 mg total) daily with breakfast for 3 days. 06/09/24 06/21/24  [name] Dorn NOVAK, MD  triamcinolone  ointment (KENALOG ) 0.5 % Apply topically 2 (two) times daily. 06/02/24   Lucius Krabbe, NP    Allergies: Patient has no known allergies.    Review of Systems  Constitutional:  Positive for appetite change and fatigue. Negative for fever.  Respiratory:  Positive for chest tightness and shortness of breath. Negative for cough.   Cardiovascular:  Positive for leg swelling. Negative for chest pain and palpitations.  Gastrointestinal:  Positive for nausea. Negative for abdominal pain, diarrhea and vomiting.  Musculoskeletal:  Negative for back pain.  Skin:  Negative for rash.  Neurological:  Negative for headaches.    Updated Vital Signs BP (!) 92/53   Pulse (!) 123   Temp 99.2 F (37.3 C) (Oral)   Resp 14   Ht 5' 8 (1.727 m)   Wt 84.4 kg   SpO2 98%   BMI 28.28 kg/m   Physical Exam Vitals and nursing note reviewed.  Constitutional:      General: He is not in acute distress.    Appearance: Normal appearance.  HENT:     Head: Normocephalic.     Mouth/Throat:     Mouth: Mucous membranes are moist.  Cardiovascular:     Rate and Rhythm: Normal rate.  Pulmonary:     Effort: Tachypnea present.     Breath sounds: Examination of the right-lower field reveals decreased breath sounds. Examination of the left-lower field reveals decreased breath sounds. Decreased breath sounds and wheezing present.  Chest:     Chest wall: No tenderness or crepitus.  Abdominal:     Palpations: Abdomen is soft.     Tenderness: There is no abdominal tenderness.  Musculoskeletal:     Comments: Minimal edema of b/l ankles, bilateral calves unremarkable   Skin:    General: Skin is warm.  Neurological:     Mental Status: He is alert and oriented to person, place, and time. Mental status is at baseline.   Psychiatric:        Mood and Affect: Mood normal.     (all labs ordered are listed, but only abnormal results are displayed) Labs Reviewed  CBC - Abnormal; Notable for the following components:      Result Value   WBC 14.2 (*)    RBC 3.98 (*)    Hemoglobin 12.6 (*)    HCT 38.7 (*)    All other components within normal limits  I-STAT CG4 LACTIC ACID, ED - Abnormal; Notable for the following components:   Lactic Acid, Venous 3.8 (*)    All other components within normal limits  CULTURE, BLOOD (ROUTINE X 2)  CULTURE, BLOOD (ROUTINE X 2)  RESP PANEL BY RT-PCR (RSV, FLU A&B, COVID)  [id number]  BASIC METABOLIC PANEL WITH GFR  URINALYSIS, ROUTINE W REFLEX MICROSCOPIC  TROPONIN I (HIGH SENSITIVITY)    EKG: EKG Interpretation Date/Time:  Saturday June 12 2024 07:50:47 EDT Ventricular Rate:  124 PR Interval:  134 QRS Duration:  125 QT Interval:  320 QTC Calculation: 460 R Axis:   -41  Text Interpretation: Sinus tachycardia RBBB and LAFB ST elevation, consider inferior injury Confirmed by Bari Flank [phone]) on 06/12/2024 8:14:46 AM  Radiology: [facility] Chest Portable 1 View Result Date: 06/12/2024 CLINICAL DATA:  Shortness of breath.  COPD. EXAM: PORTABLE CHEST 1 VIEW COMPARISON:  06/04/2024 FINDINGS: Lordotic positioning noted. The heart size and mediastinal contours are within normal limits. Both lungs are clear. IMPRESSION: No active disease. Electronically Signed   By: Norleen DELENA Kil M.D.   On: 06/12/2024 09:27     .Critical Care  Performed by: Bari Flank HERO, DO Authorized by: Bari Flank HERO, DO   Critical care provider statement:    Critical care time (minutes):  30   Critical care time was exclusive of:  Separately billable procedures and treating other patients   Critical care was necessary to treat or prevent imminent or life-threatening deterioration of the following conditions:  Sepsis   Critical care was time spent personally by me on the following activities:   Development of treatment plan with patient or surrogate, discussions with consultants, evaluation of patient's response to treatment, examination of patient, ordering and review of laboratory studies, ordering and review of radiographic studies, ordering and performing treatments and interventions, pulse oximetry, re-evaluation of patient's condition and review of old charts   I assumed direction of critical care for this patient from another provider in my specialty: no     Care discussed with: admitting provider      Medications Ordered in the ED  sodium chloride  0.9 % bolus 500 mL (500 mLs Intravenous New Bag/Given 06/12/24 9071)                                    Medical Decision Making Amount and/or Complexity of Data Reviewed Labs: ordered. Radiology: ordered.  Risk Prescription drug management. Decision regarding hospitalization.   78 year old male presents emergency department with ongoing shortness of breath and wheezing.  Was seen at a separate ER and treated, was also seen by pulmonology and is currently on steroid taper in addition to home medications.  He has not had a significant improvement and comes in today with worsening shortness of breath.    Patient is tachycardic on arrival.  EKG is sinus tachycardia, he is at times tachypneic with faint scattered wheezes.  Initially blood was stable but became hypotensive with systolics in the high 80s.  Blood work shows a leukocytosis, could be from steroid use.  But notes a lactic acid of 3.8, with the tachycardia and slight hypotension concern for SIRS/sepsis.  Will cover the patient with antibiotics.  Also has baseline mild kidney dysfunction.  Troponin is negative.  Chest x-ray shows no focal finding.  Patient continues to be tachycardic, will pursue CT imaging to rule out PE.  CT PE study is negative, no findings of pneumonia.  Tachycardia has improved with IV fluids.  Lactic is improving to 3.2.  Given the patient's lab  results, clinical presentation, he will require admission for further evaluation and treatment.  Patients evaluation and results requires admission for further treatment and care.  Spoke with hospitalist, reviewed patient's ED course and they accept admission.  Patient agrees with admission plan, offers no new complaints and is stable/unchanged at time of admit.     Final diagnoses:  None    ED Discharge Orders     None          Bari Roxie HERO, DO 06/12/24 1313

## 2024-06-12 NOTE — Plan of Care (Signed)

## 2024-06-12 NOTE — Sepsis Progress Note (Signed)
 Sepsis protocol is being followed by [facility].

## 2024-06-12 NOTE — ED Notes (Signed)
Lab made aware of add on orders

## 2024-06-12 NOTE — ED Notes (Signed)
 Charge RN made aware Pt is ready to be transported.  Transport notified.

## 2024-06-12 NOTE — ED Notes (Signed)
 Pt given ice water per request w/ EDP permission.

## 2024-06-12 NOTE — ED Notes (Signed)
 Pt reported to this writer that he has been urinating frequently.

## 2024-06-12 NOTE — ED Notes (Signed)
Help get patient into a gown on the monitor did EKG shown to er provider patient is resting with call bell in reach  

## 2024-06-12 NOTE — ED Triage Notes (Signed)
 Pt 78 year old male arrive to ED via EMS for [facility]. EMS reports prior to arrival pt had administered himself an albuterol  treatment with no relief and decided to call 911.  Pt has a history of COPD and reports he has had increased SHOB for several days and it has been progressively worse.   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

## 2024-06-12 NOTE — ED Notes (Signed)
 Lab made aware of the add on for Procalcitonin.

## 2024-06-12 NOTE — H&P (Signed)
 History and Physical    Patient: Gary Wright FMW:[id number] DOB: Aug 12, 1946 DOA: 06/12/2024 DOS: the patient was seen and examined on 06/12/2024 . PCP: Lucius Krabbe, NP  Patient coming from: Home Chief complaint: Chief Complaint  Patient presents with   Shortness of Breath   HPI:  Gary Wright is a 78 y.o. male with past medical history  of COPD, CAD,-CABG, essential hypertension, diabetes mellitus type 2 presenting for shortness of breath that has been progressively getting worse since 2 August.  Since then patient has been treated with inhalers and steroid therapies.  Patient at bedside today reports chest tightness and pain that started yesterday.  States he no longer smokes and has been several years.  Patient is alert awake oriented in no distress intermittently is dyspneic but is able to complete sentences and no use of accessory muscles.  ED Course:  Vital signs in the ED were notable for the following: Patient meets sepsis criteria with vitals lactic acid and an abnormal urinalysis.  Vitals:   06/12/24 1215 06/12/24 1230 06/12/24 1235 06/12/24 1245  BP: (!) 100/57 106/60  (!) 115/58  Pulse: (!) 102 (!) 101  (!) 101  Temp:   99 F (37.2 C)   Resp: 18 15  17   Height:      Weight:      SpO2: 91% 93%  92%  TempSrc:   Oral   BMI (Calculated):      >>ED evaluation thus far shows: -CMP shows glucose 155 BUN of 27 creatinine of 1.36 EGFR 54 LFTs with an albumin of 2.7. - Magnesium  ordered and pending. - BMP ordered and pending. - Troponin flat. -CBC shows leukocytosis with a white count of 14.2 hemoglobin of 12.6 MCV 97.2 normal platelets at 249. - Lactic acid level of 3.8 with a recheck at 3.2. - Viral panel negative for flu RSV and COVID. - Urinalysis is abnormal with 21-50 WBCs small leukocytes rare bacteria 0-5 RBCs. - Blood cultures collected in the ED for concerns of sepsis. - CT angio chest PE protocol negative for PE and no acute intrathoracic findings  multivessel CAD and aortic atherosclerosis. - Echo in 2024 October shows ejection fraction of 55 to 60% left ventricular diastolic parameters are normal right ventricular systolic function is normal  >>While in the ED patient received the following: Medications  lactated ringers  infusion (has no administration in time range)  azithromycin  (ZITHROMAX ) 500 mg in sodium chloride  0.9 % 250 mL IVPB (500 mg Intravenous New Bag/Given 06/12/24 1215)  sodium chloride  0.9 % bolus 500 mL (0 mLs Intravenous Stopped 06/12/24 1043)  lactated ringers  bolus 2,000 mL (2,000 mLs Intravenous New Bag/Given 06/12/24 1143)  cefTRIAXone  (ROCEPHIN ) 2 g in sodium chloride  0.9 % 100 mL IVPB (0 g Intravenous Stopped 06/12/24 1213)  iohexol  (OMNIPAQUE ) 350 MG/ML injection 75 mL (75 mLs Intravenous Contrast Given 06/12/24 1130)   Review of Systems  Respiratory:  Positive for shortness of breath.   Cardiovascular:  Positive for chest pain. Negative for leg swelling.   Past Medical History:  Diagnosis Date   Acute dyspnea 03/24/2022   Acute exacerbation of chronic obstructive pulmonary disease (COPD) (HCC) 03/25/2022   Asthma    CAD (coronary artery disease) of artery bypass graft 06/13/2016   Chest pain    COPD (chronic obstructive pulmonary disease) (HCC)    COPD with acute exacerbation (HCC) 11/02/2020   Quit smokig 2019  - PFT's  121/22/23  FEV1 1.83 (60 % ) ratio 0.57  p 15 % improvement  from saba p 0 prior to study with DLCO  16.4 (65%)   and FV curve mildly concave  - try off acei and on otc gerd rx 06/02/2023 >>>       Depression 09/01/2007   Qualifier: Diagnosis of  By: Nickola CMA, [place]     Elevated troponin 06/13/2016   History of lung cancer 11/02/2020   Hyperlipidemia 11/02/2020   Hypertension    Prolonged QT interval 11/02/2020   Stress-induced cardiomyopathy 06/13/2016   TOBACCO ABUSE 09/01/2007   Qualifier: Diagnosis of   By: Nickola CMA, [place]         Past Surgical History:  Procedure Laterality  Date   BACK SURGERY     fusion in 1973   CARDIAC CATHETERIZATION N/A 06/13/2016   Procedure: Left Heart Cath and Coronary Angiography;  Surgeon: Lonni Hanson, MD;  Location: [facility] INVASIVE CV LAB;  Service: Cardiovascular;  Laterality: N/A;   INGUINAL HERNIA REPAIR Right 04/21/2018   Procedure: RIGHT INGUINAL HERNIA REPAIR ERAS PATHWAY;  Surgeon: Vanderbilt Ned, MD;  Location: [facility] OR;  Service: General;  Laterality: Right;  TAP BLOCK   INSERTION OF MESH Right 04/21/2018   Procedure: INSERTION OF MESH;  Surgeon: Vanderbilt Ned, MD;  Location: [facility] OR;  Service: General;  Laterality: Right;   LUNG REMOVAL, PARTIAL Left    left upper lung removed about 6-7 years ago reported by pt   TONSILLECTOMY      reports that he quit smoking about 6 years ago. His smoking use included cigarettes. He has never used smokeless tobacco. He reports that he does not drink alcohol and does not use drugs. No Known Allergies Family History  Problem Relation Age of Onset   Hypertension Mother    Hypertension Father    Prior to Admission medications   Medication Sig Start Date End Date Taking? Authorizing Provider  albuterol  (PROVENTIL ) (2.5 MG/3ML) 0.083% nebulizer solution Take 3 mLs (2.5 mg total) by nebulization every 4 (four) hours as needed for wheezing or shortness of breath. 06/09/24   [name] Dorn NOVAK, MD  albuterol  (VENTOLIN  HFA) 108 (90 Base) MCG/ACT inhaler Inhale 1-2 puffs into the lungs every 4 (four) hours as needed for wheezing or shortness of breath. 06/09/24   [name] Dorn NOVAK, MD  atorvastatin  (LIPITOR) 40 MG tablet TAKE 1 TABLET(40 MG) BY MOUTH DAILY AT 6 PM 08/18/23   Pietro Redell RAMAN, MD  azithromycin  (ZITHROMAX ) 250 MG tablet Take as directed 06/09/24   [name] Dorn NOVAK, MD  budesonide  (PULMICORT ) 0.5 MG/2ML nebulizer solution Take 2 mLs (0.5 mg total) by nebulization 2 (two) times daily. 01/13/24   [name] Dorn NOVAK, MD  buPROPion  (WELLBUTRIN  XL) 300 MG 24 hr tablet Take 300 mg by mouth  every morning. 05/11/16   [provider]  citalopram  (CELEXA ) 40 MG tablet Take 40 mg by mouth every morning. 06/01/16   [provider]  clotrimazole -betamethasone  (LOTRISONE ) cream Apply 1 Application topically 2 (two) times daily. Apply to buttocks and left ankle & heel. 02/05/24   Lucius Krabbe, NP  cyclobenzaprine  (FLEXERIL ) 5 MG tablet Take 1 tablet (5 mg total) by mouth 3 (three) times daily as needed for muscle spasms. 03/23/24   Lucius Krabbe, NP  diphenoxylate -atropine  (LOMOTIL ) 2.5-0.025 MG tablet Take 1-2 tablets by mouth 4 (four) times daily as needed for diarrhea or loose stools (Max dose of 8 tabs in 24 hours). 04/14/24   Lucius Krabbe, NP  Dupilumab  (DUPIXENT ) 300 MG/2ML SOAJ Inject 300 mg into the skin every 14 (fourteen)  days. 04/12/24   [name] Dorn NOVAK, MD  famotidine  (PEPCID ) 20 MG tablet One after supper 12/22/23   Darlean Ozell NOVAK, MD  Fluticasone -Umeclidin-Vilant (TRELEGY ELLIPTA ) 200-62.5-25 MCG/ACT AEPB Inhale 1 puff into the lungs daily.    [provider]  glipiZIDE  (GLUCOTROL  XL) 2.5 MG 24 hr tablet TAKE 1 TABLET(2.5 MG) BY MOUTH TWICE DAILY AFTER A MEAL 05/03/24   Hudnell, Corean, NP  ipratropium-albuterol  (DUONEB) 0.5-2.5 (3) MG/3ML SOLN Take 3 mLs by nebulization 4 (four) times daily. 12/22/23   Darlean Ozell NOVAK, MD  metoprolol  succinate (TOPROL -XL) 25 MG 24 hr tablet TAKE 1/2 TABLET(12.5 MG) BY MOUTH DAILY 04/21/24   Pietro Redell RAMAN, MD  olmesartan  (BENICAR ) 40 MG tablet Take 1 tablet (40 mg total) by mouth daily. 06/02/23 06/09/24  Darlean Ozell NOVAK, MD  ondansetron  (ZOFRAN ) 4 MG tablet Take 1 tablet (4 mg total) by mouth every 8 (eight) hours as needed for nausea or vomiting. 04/14/24   Lucius Corean, NP  pantoprazole  (PROTONIX ) 40 MG tablet One tablet by mouth daily 30 to 60 minutes before the first meal of the day 05/26/24   Darlean Ozell NOVAK, MD  predniSONE  (DELTASONE ) 10 MG tablet Take 4 tablets (40 mg total) by mouth daily with  breakfast for 3 days, THEN 3 tablets (30 mg total) daily with breakfast for 3 days, THEN 2 tablets (20 mg total) daily with breakfast for 3 days, THEN 1 tablet (10 mg total) daily with breakfast for 3 days. 06/09/24 06/21/24  [name] Dorn NOVAK, MD  triamcinolone  ointment (KENALOG ) 0.5 % Apply topically 2 (two) times daily. 06/02/24   Lucius Corean, NP                                                                                 Vitals:   06/12/24 1215 06/12/24 1230 06/12/24 1235 06/12/24 1245  BP: (!) 100/57 106/60  (!) 115/58  Pulse: (!) 102 (!) 101  (!) 101  Resp: 18 15  17   Temp:   99 F (37.2 C)   TempSrc:   Oral   SpO2: 91% 93%  92%  Weight:      Height:       Physical Exam Vitals and nursing note reviewed.  Constitutional:      General: He is not in acute distress.    Appearance: He is obese. He is not ill-appearing.  HENT:     Head: Normocephalic and atraumatic.     Right Ear: Hearing normal.     Left Ear: Hearing normal.     Nose: No nasal deformity.     Mouth/Throat:     Lips: Pink.  Eyes:     General: Lids are normal.     Extraocular Movements: Extraocular movements intact.  Cardiovascular:     Rate and Rhythm: Normal rate and regular rhythm.     Heart sounds: Normal heart sounds.  Pulmonary:     Effort: Pulmonary effort is normal.     Breath sounds: Normal breath sounds.  Abdominal:     General: Bowel sounds are normal. There is no distension.     Palpations: Abdomen is soft. There is no mass.     Tenderness: There  is no abdominal tenderness.  Musculoskeletal:     Right lower leg: No edema.     Left lower leg: No edema.  Skin:    General: Skin is warm.  Neurological:     General: No focal deficit present.     Mental Status: He is alert and oriented to person, place, and time.     Cranial Nerves: Cranial nerves 2-12 are intact.  Psychiatric:        Speech: Speech normal.     Labs on Admission: I have personally reviewed following labs and imaging  studies CBC: Recent Labs  Lab 06/12/24 0814  WBC 14.2*  HGB 12.6*  HCT 38.7*  MCV 97.2  PLT 249   Basic Metabolic Panel: Recent Labs  Lab 06/12/24 0814  NA 137  K 4.2  CL 102  CO2 23  GLUCOSE 154*  BUN 27*  CREATININE 1.36*  CALCIUM  8.9   GFR: Estimated Creatinine Clearance: 48.1 mL/min (A) (by C-G formula based on SCr of 1.36 mg/dL (H)). Liver Function Tests: Recent Labs  Lab 06/12/24 1135  AST 19  ALT 21  ALKPHOS 63  BILITOT 0.6  PROT 4.8*  ALBUMIN 2.7*   No results for input(s): LIPASE, AMYLASE in the last 168 hours. No results for input(s): AMMONIA in the last 168 hours. Recent Labs    07/31/23 1411 11/03/23 1133 11/06/23 1038 11/18/23 1131 01/26/24 1337 01/29/24 1433 04/14/24 1830 04/15/24 0227 06/04/24 1132 06/12/24 0814  BUN 26* 18 24* 22 27* 29* 28* 25* 32* 27*  CREATININE 1.25 1.23 1.34 1.40 1.41 1.46 1.60* 1.53* 1.58* 1.36*    Estimated Creatinine Clearance: 48.1 mL/min (A) (by C-G formula based on SCr of 1.36 mg/dL (H)).   Recent Labs    07/31/23 1411 11/03/23 1133 11/06/23 1038 11/18/23 1131 01/26/24 1337 01/29/24 1433 04/14/24 1830 04/15/24 0227 06/04/24 1132 06/12/24 0814  BUN 26* 18 24* 22 27* 29* 28* 25* 32* 27*  CREATININE 1.25 1.23 1.34 1.40 1.41 1.46 1.60* 1.53* 1.58* 1.36*  CO2 30 23 28 30 27 30 25 24 24  23   Cardiac Enzymes: No results for input(s): CKTOTAL, CKMB, CKMBINDEX, TROPONINI in the last 168 hours. BNP (last 3 results) No results for input(s): PROBNP in the last 8760 hours. HbA1C: No results for input(s): HGBA1C in the last 72 hours. CBG: No results for input(s): GLUCAP in the last 168 hours. Lipid Profile: No results for input(s): CHOL, HDL, LDLCALC, TRIG, CHOLHDL, LDLDIRECT in the last 72 hours. Thyroid  Function Tests: No results for input(s): TSH, T4TOTAL, FREET4, T3FREE, THYROIDAB in the last 72 hours. Anemia Panel: No results for input(s): VITAMINB12,  FOLATE, FERRITIN, TIBC, IRON, RETICCTPCT in the last 72 hours. Urine analysis:    Component Value Date/Time   COLORURINE YELLOW 06/12/2024 0923   APPEARANCEUR HAZY (A) 06/12/2024 0923   LABSPEC 1.013 06/12/2024 0923   PHURINE 5.0 06/12/2024 0923   GLUCOSEU NEGATIVE 06/12/2024 0923   HGBUR MODERATE (A) 06/12/2024 0923   BILIRUBINUR NEGATIVE 06/12/2024 0923   KETONESUR NEGATIVE 06/12/2024 0923   PROTEINUR NEGATIVE 06/12/2024 0923   NITRITE NEGATIVE 06/12/2024 0923   LEUKOCYTESUR SMALL (A) 06/12/2024 0923   Radiological Exams on Admission: CT Angio Chest PE W/Cm &/Or Wo Cm Result Date: 06/12/2024 CLINICAL DATA:  Pulmonary embolism (PE) suspected, low to intermediate prob, positive D-dimer EXAM: CT ANGIOGRAPHY CHEST WITH CONTRAST TECHNIQUE: Multidetector CT imaging of the chest was performed using the standard protocol during bolus administration of intravenous contrast. Multiplanar CT image reconstructions and MIPs  were obtained to evaluate the vascular anatomy. RADIATION DOSE REDUCTION: This exam was performed according to the departmental dose-optimization program which includes automated exposure control, adjustment of the mA and/or kV according to patient size and/or use of iterative reconstruction technique. CONTRAST:  75mL OMNIPAQUE  IOHEXOL  350 MG/ML SOLN COMPARISON:  CT chest dated 07/04/2023. FINDINGS: Cardiovascular: Satisfactory opacification of the pulmonary arteries to the segmental level. No evidence of pulmonary embolism. Normal heart size. No pericardial effusion. Multivessel coronary artery calcifications. Thoracic aorta is normal in caliber with atherosclerotic calcification. Mediastinum/Nodes: No enlarged mediastinal, hilar, or axillary lymph nodes. Thyroid  gland, trachea, and esophagus demonstrate no significant findings. Lungs/Pleura: Centrilobular emphysema. Postoperative changes related to left upper lobectomy. Left lower lobe calcified granuloma. No focal  consolidation, pleural effusion, or pneumothorax. Upper Abdomen: No acute abnormality. Punctate left upper pole renal calculus. Musculoskeletal: No acute osseous abnormality. Prior left thoracotomy. Moderate to advanced degenerative changes of the bilateral glenohumeral joints. Degenerative changes of the mid to lower thoracic spine. Review of the MIP images confirms the above findings. IMPRESSION: 1. No evidence of pulmonary embolism. 2. No acute intrathoracic findings. 3. Multivessel coronary artery calcifications. 4. Aortic Atherosclerosis (ICD10-I70.0) and Emphysema (ICD10-J43.9). Electronically Signed   By: Harrietta Sherry M.D.   On: 06/12/2024 12:07   DG Chest Portable 1 View Result Date: 06/12/2024 CLINICAL DATA:  Shortness of breath.  COPD. EXAM: PORTABLE CHEST 1 VIEW COMPARISON:  06/04/2024 FINDINGS: Lordotic positioning noted. The heart size and mediastinal contours are within normal limits. Both lungs are clear. IMPRESSION: No active disease. Electronically Signed   By: Norleen DELENA Kil M.D.   On: 06/12/2024 09:27   Data Reviewed: Relevant notes from primary care and specialist visits, past discharge summaries as available in EHR, including Care Everywhere . Prior diagnostic testing as pertinent to current admission diagnoses, Updated medications and problem lists for reconciliation .ED course, including vitals, labs, imaging, treatment and response to treatment,Triage notes, nursing and pharmacy notes and ED provider's notes.Notable results as noted in HPI.Discussed case with EDMD/ ED APP/ or Specialty MD on call and as needed.  Assessment & Plan  >> Shortness of breath/COPD: Will admit patient for COPD exacerbation.  And use COPD  order set, patient has been on outpatient steroids and did receive Solu-Medrol  125 will therefore continue patient on prednisone  p.o.  Patient does have centrilobular and paraseptal emphysema and left upper lobectomy. As needed DuoNebs and albuterol , incentive  spirometer.  Pulse oximeter with ambulation.  2D echocardiogram, BNP.  Patient is currently being followed by [facility] was seen on the 20th 2 days ago for shortness of breath has been going on for the past few weeks as well.  Differentials include IPF, GERD variant asthma, will continue with IV PPI aspiration precaution.  >>Sepsis / UTI: Cont with rocephin , add urine cultures and follow.    >> CAD/history of CABG in 2017: Continue patient on atorvastatin , metoprolol  held due to blood pressure fluctuation.  >> Depression: Will continue patient on his Celexa  and bupropion .    >> Essential hypertension: Benicar  held due to blood pressure fluctuation.  Metoprolol  held.  >> Diabetes mellitus type 2: Patient does not recall name of the medication we will obtain A1c manage patient on glycemic protocol currently. Chart review shows patient is on glipizide  and is at high risk for hypoglycemia, will defer to PCP to change.    DVT prophylaxis:  Heparin  Consults:  None  Advance Care Planning:    Code Status: Full Code   Family Communication:  None Disposition Plan:  Home Severity of Illness: The appropriate patient status for this patient is OBSERVATION. Observation status is judged to be reasonable and necessary in order to provide the required intensity of service to ensure the patient's safety. The patient's presenting symptoms, physical exam findings, and initial radiographic and laboratory data in the context of their medical condition is felt to place them at decreased risk for further clinical deterioration. Furthermore, it is anticipated that the patient will be medically stable for discharge from the hospital within 2 midnights of admission.   Unresulted Labs (From admission, onward)     Start     Ordered   06/13/24 0500  Comprehensive metabolic panel  Tomorrow morning,   R        06/12/24 1314   06/13/24 0500  CBC  Tomorrow morning,   R        06/12/24 1314    06/13/24 0500  Cortisol  Tomorrow morning,   R        06/12/24 1336   06/12/24 1347  Urine Culture (for pregnant, neutropenic or urologic patients or patients with an indwelling urinary catheter)  (Urine Labs)  Add-on,   R       Question:  Indication  Answer:  Sepsis   06/12/24 1346   06/12/24 1315  ANA w/Reflex if Positive  Add-on,   AD        06/12/24 1314   06/12/24 1314  Brain natriuretic peptide  Once,   R        06/12/24 1314   06/12/24 1314  Magnesium   Add-on,   AD        06/12/24 1314   06/12/24 1314  TSH  Add-on,   AD        06/12/24 1314   06/12/24 1313  Hemoglobin A1c  Add-on,   AD        06/12/24 1314   06/12/24 1313  C-reactive protein  Add-on,   AD        06/12/24 1314   06/12/24 1307  Procalcitonin  Once,   URGENT       References:    Procalcitonin Lower Respiratory Tract Infection AND Sepsis Procalcitonin Algorithm   06/12/24 1308   06/12/24 0845  Culture, blood (routine x 2)  BLOOD CULTURE X 2,   R      06/12/24 0845            Meds ordered this encounter  Medications   sodium chloride  0.9 % bolus 500 mL   DISCONTD: lactated ringers  infusion   lactated ringers  bolus 2,000 mL    Reason 30 mL/kg dose is not being ordered:   Fluid given earlier or by EMS - completing remainder of 30 mL/kg by TBW   cefTRIAXone  (ROCEPHIN ) 2 g in sodium chloride  0.9 % 100 mL IVPB    Antibiotic Indication::   CAP   DISCONTD: azithromycin  (ZITHROMAX ) 500 mg in sodium chloride  0.9 % 250 mL IVPB    Antibiotic Indication::   CAP   iohexol  (OMNIPAQUE ) 350 MG/ML injection 75 mL   ipratropium-albuterol  (DUONEB) 0.5-2.5 (3) MG/3ML nebulizer solution 3 mL   albuterol  (PROVENTIL ) (2.5 MG/3ML) 0.083% nebulizer solution 2.5 mg   sodium chloride  flush (NS) 0.9 % injection 3 mL   lactated ringers  infusion   OR Linked Order Group    acetaminophen  (TYLENOL ) tablet 650 mg    acetaminophen  (TYLENOL ) suppository 650 mg   guaiFENesin  (MUCINEX ) 12 hr tablet 600 mg   hydrALAZINE  (APRESOLINE )  injection 5 mg   predniSONE  (DELTASONE ) tablet 40 mg   heparin  injection 5,000 Units   pantoprazole  (PROTONIX ) injection 40 mg   FOLLOWED BY Linked Order Group    azithromycin  (ZITHROMAX ) 500 mg in sodium chloride  0.9 % 250 mL IVPB     Antibiotic Indication::   Other Indication (list below)     Other Indication::   COPD exacerbation    azithromycin  (ZITHROMAX ) tablet 500 mg   insulin  aspart (novoLOG ) injection 0-15 Units    Correction coverage::   Moderate (average weight, post-op)    CBG < 70::   implement hypoglycemia protocol    CBG 70 - 120::   0 units    CBG 121 - 150::   2 units    CBG 151 - 200::   3 units    CBG 201 - 250::   5 units    CBG 251 - 300::   8 units    CBG 301 - 350::   11 units    CBG 351 - 400::   15 units    CBG > 400:   call MD and obtain STAT lab verification   cefTRIAXone  (ROCEPHIN ) 2 g in sodium chloride  0.9 % 100 mL IVPB    Antibiotic Indication::   CAP     Orders Placed This Encounter  Procedures   Critical Care   Culture, blood (routine x 2)   Resp panel by RT-PCR (RSV, Flu A&B, Covid) Anterior Nasal Swab   Urine Culture (for pregnant, neutropenic or urologic patients or patients with an indwelling urinary catheter)   DG Chest Portable 1 View   CT Angio Chest PE W/Cm &/Or Wo Cm   Basic metabolic panel   CBC   Urinalysis, Routine w reflex microscopic -Urine, Clean Catch   Hepatic function panel   Procalcitonin   Hemoglobin A1c   C-reactive protein   Comprehensive metabolic panel   CBC   Brain natriuretic peptide   Magnesium    TSH   ANA w/Reflex if Positive   Cortisol   Diet Carb Modified Fluid consistency: Thin; Room service appropriate? Yes   Document Height and Actual Weight   If O2 Sat <94% administer O2 at 2 liters/minute via nasal cannula   ED Cardiac monitoring (when placed in a treatment room)   Document Height and Actual Weight   DO NOT delay antibiotics if unable to obtain blood culture.   Refer to Sidebar Report:   Apply  Chronic Obstructive Pulmonary Disease Care Plan   Provide Smoking / Tobacco cessation education.  Provide education material and information on community counseling.   Cardiac Monitoring Continuous x 24 hours Indications for use: Other; other indications for use: COPD exacerbation   Maintain IV access   Vital signs   Notify physician (specify)   Mobility Protocol: No Restrictions   Refer to Sidebar Report Mobility Protocol for Adult Inpatient   Initiate Adult Central Line Maintenance and Catheter Protocol for patients with central line (CVC, PICC, Port, Hemodialysis, Trialysis)   If patient diabetic or glucose greater than 140 notify physician for Sliding Scale Insulin  Orders   Intake and Output   Initiate CHG Protocol   Do not place and if present remove PureWick   Initiate Oral Care Protocol   Initiate Carrier Fluid Protocol   Nurse to provide smoking / tobacco cessation education   RN may order General Admission PRN Orders utilizing General Admission PRN medications (through manage orders) for the following patient needs: allergy symptoms (Claritin), cold sores (  Carmex), cough (Robitussin DM), eye irritation (Liquifilm Tears), hemorrhoids (Tucks), indigestion (Maalox), minor skin irritation (Hydrocortisone Cream), muscle pain Lucienne Gay), nose irritation (saline nasal spray) and sore throat (Chloraseptic spray).   Check Pulse Oximetry while ambulating   Apply Diabetes Mellitus Care Plan   STAT CBG when hypoglycemia is suspected. If treated, recheck every 15 minutes after each treatment until CBG >/= 70 mg/dl   Refer to Hypoglycemia Protocol Sidebar Report for treatment of CBG < 70 mg/dl   No HS correction Insulin    Full code   Code Sepsis activation.  This occurs automatically when order is signed and prioritizes pharmacy, lab, and radiology services for STAT collections and interventions.  If [facility] downtime, call [facility] [phone]) to activate Code Sepsis.   Consult to hospitalist    Nutritional services consult   Consult to Transition of Care Team   Consult to respiratory care treatment   Pharmacy consult   OT eval and treat   PT eval and treat   Pulse oximetry check with vital signs   Oxygen therapy Mode or (Route): Nasal cannula; Liters Per Minute: 2; Keep O2 saturation between: greater than 92 %   Incentive spirometry RT   I-Stat CG4 Lactic Acid   EKG 12-Lead   ED EKG   Insert 2nd peripheral IV if not already present.   Place in observation (patient's expected length of stay will be less than 2 midnights)   Aspiration precautions   Fall precautions    Author: Mario LULLA Blanch, MD 12 pm- 8 pm. [facility]. 06/12/2024 1:46 PM Please note for any communication after hours contact [facility] Assigned provider on call on Amion.

## 2024-06-12 NOTE — ED Notes (Signed)
 Hospitalist at bedside

## 2024-06-12 NOTE — Progress Notes (Addendum)
 Received message from nurse about pt having chest tightness.    Troponin so far have been negative but we will repeat one more.    CTA negative for PNA or PE or Effusion or pericardial issue.    IV PPI has been given for possible GERD related issue that may be causing chest pain.    Awaiting for ABG/ repeat EKG/ and troponin.    BNP is elevated and pulmonary hypertension is a Differential .will consult cardiology if chest tightness  continues or does  not resolve.    We will proceed with echo, TNI negative.   Lactic acid is elevated and will decrease frequency of Inhalers. Follow BP and consider albumin if continues to be low.

## 2024-06-13 ENCOUNTER — Observation Stay (HOSPITAL_BASED_OUTPATIENT_CLINIC_OR_DEPARTMENT_OTHER)

## 2024-06-13 DIAGNOSIS — J441 Chronic obstructive pulmonary disease with (acute) exacerbation: Secondary | ICD-10-CM | POA: Diagnosis not present

## 2024-06-13 DIAGNOSIS — I959 Hypotension, unspecified: Secondary | ICD-10-CM | POA: Diagnosis not present

## 2024-06-13 DIAGNOSIS — R079 Chest pain, unspecified: Secondary | ICD-10-CM

## 2024-06-13 DIAGNOSIS — R0602 Shortness of breath: Secondary | ICD-10-CM | POA: Diagnosis not present

## 2024-06-13 LAB — COMPREHENSIVE METABOLIC PANEL WITH GFR
ALT: 18 U/L (ref 0–44)
AST: 14 U/L — ABNORMAL LOW (ref 15–41)
Albumin: 2.6 g/dL — ABNORMAL LOW (ref 3.5–5.0)
Alkaline Phosphatase: 55 U/L (ref 38–126)
Anion gap: 8 (ref 5–15)
BUN: 19 mg/dL (ref 8–23)
CO2: 24 mmol/L (ref 22–32)
Calcium: 8.4 mg/dL — ABNORMAL LOW (ref 8.9–10.3)
Chloride: 106 mmol/L (ref 98–111)
Creatinine, Ser: 1.21 mg/dL (ref 0.61–1.24)
GFR, Estimated: >60 mL/min (ref >60)
Glucose, Bld: 134 mg/dL — ABNORMAL HIGH (ref 70–99)
Potassium: 4.3 mmol/L (ref 3.5–5.1)
Sodium: 138 mmol/L (ref 135–145)
Total Bilirubin: 0.5 mg/dL (ref 0.0–1.2)
Total Protein: 4.8 g/dL — ABNORMAL LOW (ref 6.5–8.1)

## 2024-06-13 LAB — CORTISOL: Cortisol, Plasma: 2.7 ug/dL

## 2024-06-13 LAB — ECHOCARDIOGRAM COMPLETE
AR max vel: 1.75 cm2
AV Area VTI: 1.76 cm2
AV Area mean vel: 1.83 cm2
AV Mean grad: 3.5 mmHg
AV Peak grad: 7.2 mmHg
Ao pk vel: 1.34 m/s
Height: 68 in
S' Lateral: 3.2 cm
Single Plane A4C EF: 48.2 %
Weight: 2976 [oz_av]

## 2024-06-13 LAB — CBC
HCT: 35 % — ABNORMAL LOW (ref 39.0–52.0)
Hemoglobin: 11.4 g/dL — ABNORMAL LOW (ref 13.0–17.0)
MCH: 31.9 pg (ref 26.0–34.0)
MCHC: 32.6 g/dL (ref 30.0–36.0)
MCV: 98 fL (ref 80.0–100.0)
Platelets: 205 K/uL (ref 150–400)
RBC: 3.57 MIL/uL — ABNORMAL LOW (ref 4.22–5.81)
RDW: 13.9 % (ref 11.5–15.5)
WBC: 21.8 K/uL — ABNORMAL HIGH (ref 4.0–10.5)
nRBC: 0 % (ref 0.0–0.2)

## 2024-06-13 LAB — GLUCOSE, CAPILLARY
Glucose-Capillary: 117 mg/dL — ABNORMAL HIGH (ref 70–99)
Glucose-Capillary: 161 mg/dL — ABNORMAL HIGH (ref 70–99)
Glucose-Capillary: 153 mg/dL — ABNORMAL HIGH (ref 70–99)
Glucose-Capillary: 148 mg/dL — ABNORMAL HIGH (ref 70–99)

## 2024-06-13 LAB — LACTIC ACID, PLASMA
Lactic Acid, Venous: 1.4 mmol/L (ref 0.5–1.9)
Lactic Acid, Venous: 2.5 mmol/L (ref 0.5–1.9)

## 2024-06-13 MED ORDER — ARFORMOTEROL TARTRATE 15 MCG/2ML IN NEBU
15.0000 ug | INHALATION_SOLUTION | Freq: Two times a day (BID) | RESPIRATORY_TRACT | Status: DC
  Administered 2024-06-13 – 2024-06-14 (×2): 15 ug via RESPIRATORY_TRACT
  Filled 2024-06-13 (×2): qty 2

## 2024-06-13 MED ORDER — BUDESONIDE 0.5 MG/2ML IN SUSP
0.5000 mg | Freq: Two times a day (BID) | RESPIRATORY_TRACT | Status: DC
  Administered 2024-06-13 – 2024-06-14 (×2): 0.5 mg via RESPIRATORY_TRACT
  Filled 2024-06-13 (×2): qty 2

## 2024-06-13 MED ORDER — INSULIN ASPART 100 UNIT/ML IJ SOLN
0.0000 [IU] | Freq: Three times a day (TID) | INTRAMUSCULAR | Status: DC
  Administered 2024-06-13 (×2): 2 [IU] via SUBCUTANEOUS

## 2024-06-13 MED ORDER — REVEFENACIN 175 MCG/3ML IN SOLN
175.0000 ug | Freq: Every day | RESPIRATORY_TRACT | Status: DC
  Administered 2024-06-14: 175 ug via RESPIRATORY_TRACT
  Filled 2024-06-13: qty 3

## 2024-06-13 MED ORDER — BUDESONIDE 0.5 MG/2ML IN SUSP
0.5000 mg | Freq: Two times a day (BID) | RESPIRATORY_TRACT | Status: DC
  Filled 2024-06-13: qty 2

## 2024-06-13 MED ORDER — PANTOPRAZOLE SODIUM 40 MG PO TBEC
40.0000 mg | DELAYED_RELEASE_TABLET | Freq: Every day | ORAL | Status: DC
  Administered 2024-06-14: 40 mg via ORAL
  Filled 2024-06-13 (×2): qty 1

## 2024-06-13 NOTE — Plan of Care (Signed)

## 2024-06-13 NOTE — Progress Notes (Signed)
  Echocardiogram 2D Echocardiogram has been performed.  Gary Wright 06/13/2024, 5:20 PM

## 2024-06-13 NOTE — Progress Notes (Signed)
 OT Cancellation Note  Patient Details Name: Marty Uy MRN: [id number] DOB: [birth year]   Cancelled Treatment:    Reason Eval/Treat Not Completed: OT screened, no needs identified, will sign off (per discussion with PT, pt with no acute OT needs at this time, will screen. Please reconsult if there is a change in pt status.)  Garvin Ellena K, OTD, OTR/L SecureChat Preferred Acute Rehab [phone]   Laneta MARLA Pereyra 06/13/2024, 9:27 AM

## 2024-06-13 NOTE — Progress Notes (Signed)
 Incentive spirometry introduced with teach back. Flutter valve ordered per MD.

## 2024-06-13 NOTE — Evaluation (Signed)
 Physical Therapy Evaluation Patient Details Name: Gary Wright MRN: [id number] DOB: 06/29/1946 Today's Date: 06/13/2024  History of Present Illness  78 y.o. male presents to [facility] 06/12/24 with progressive SOB and chest tightness. Admitted with COPD exacerbation, sepsis, and UTI. PMHx: COPD, CAD, CABG, HTN, DMT2  Clinical Impression  PTA, pt either ambulated with no AD or SP cane. Pt reports being at functional mobility baseline. Able to ambulate 256ft with ModI and SP cane as well as negotiate 10 steps with one handrail and SP cane. Discussed energy conservation techniques with pt verbalizing understanding. Pt has 24/7 assist available upon returning home. Pt has no further questions or concerns about mobility and feels comfortable discharging home whenever medically stable. No further acute or post-acute PT needs with PT signing off. Please re-consult if there is a change in pt status.  90% SpO2 on RA after negotiating steps 98% SpO2 on RA after ambulating      If plan is discharge home, recommend the following: Assist for transportation;Help with stairs or ramp for entrance   Can travel by private vehicle    Yes    Equipment Recommendations None recommended by PT     Functional Status Assessment Patient has not had a recent decline in their functional status     Precautions / Restrictions Precautions Precaution/Restrictions Comments: watch O2 Restrictions Weight Bearing Restrictions Per Provider Order: No      Mobility  Bed Mobility Overal bed mobility: Independent   Transfers Overall transfer level: Independent Equipment used: None   Ambulation/Gait Ambulation/Gait assistance: Modified independent (Device/Increase time) Gait Distance (Feet): 250 Feet Assistive device: Straight cane Gait Pattern/deviations: Step-through pattern, Decreased stride length Gait velocity: decr     General Gait Details: increased medial/lateral sway with shortened steps, reports baseline  gait pattern  Stairs Stairs: Yes Stairs assistance: Modified independent (Device/Increase time) Stair Management: One rail Right, Step to pattern, With cane Number of Stairs: 10 General stair comments: step to pattern with 1UE on rail and SP cane. Short standing rest break before beginning descent     Balance Overall balance assessment: No apparent balance deficits (not formally assessed)          Pertinent Vitals/Pain Pain Assessment Pain Assessment: No/denies pain    Home Living Family/patient expects to be discharged to:: Private residence Living Arrangements: Spouse/significant other Available Help at Discharge: Family;Available 24 hours/day Type of Home: House Home Access: Stairs to enter Entrance Stairs-Rails: Right;Left;Can reach both Entrance Stairs-Number of Steps: 3   Home Layout: One level Home Equipment: Cane - single point      Prior Function Prior Level of Function : Driving;Independent/Modified Independent    Mobility Comments: ModI with either no AD or SP cane, receives steroid injections in B hips for pain ADLs Comments: Ind     Extremity/Trunk Assessment   Upper Extremity Assessment Upper Extremity Assessment: Overall WFL for tasks assessed    Lower Extremity Assessment Lower Extremity Assessment: Overall WFL for tasks assessed    Cervical / Trunk Assessment Cervical / Trunk Assessment: Normal  Communication   Communication Communication: No apparent difficulties    Cognition Arousal: Alert Behavior During Therapy: WFL for tasks assessed/performed   PT - Cognitive impairments: No apparent impairments    Following commands: Intact       Cueing Cueing Techniques: Verbal cues      PT Assessment Patient does not need any further PT services         PT Goals (Current goals can be found in the  Care Plan section)  Acute Rehab PT Goals PT Goal Formulation: All assessment and education complete, DC therapy     AM-PAC PT 6  Clicks Mobility  Outcome Measure Help needed turning from your back to your side while in a flat bed without using bedrails?: None Help needed moving from lying on your back to sitting on the side of a flat bed without using bedrails?: None Help needed moving to and from a bed to a chair (including a wheelchair)?: None Help needed standing up from a chair using your arms (e.g., wheelchair or bedside chair)?: None Help needed to walk in hospital room?: None Help needed climbing 3-5 steps with a railing? : None 6 Click Score: 24    End of Session   Activity Tolerance: Patient tolerated treatment well Patient left: in chair;with call bell/phone within reach Nurse Communication: Mobility status (O2 levels) PT Visit Diagnosis: Other abnormalities of gait and mobility (R26.89)    Time: 9199-9176 PT Time Calculation (min) (ACUTE ONLY): 23 min   Charges:   PT Evaluation $PT Eval Low Complexity: 1 Low   PT General Charges $$ ACUTE PT VISIT: 1 Visit       Kate ORN, PT, DPT Secure Chat Preferred  Rehab Office [phone]   Kate BRAVO [place]olyn 06/13/2024, 9:57 AM

## 2024-06-13 NOTE — Progress Notes (Signed)
 Saturation Qualification :  Patient's SPO2 at rest : 99% Patient's SPO2 while ambulating : 96% - 99%  Patient developed tachycardia  , HR upto 114/min while ambulating.

## 2024-06-13 NOTE — Progress Notes (Signed)
 PROGRESS NOTE   Gary Wright  FMW:[id number]    DOB: [birth year]    DOA: 06/12/2024  PCP: Lucius Krabbe, NP   I have briefly reviewed patients previous medical records in [facility].   Brief Hospital Course:  78 year old married male, independent, medical history significant for, former tobacco use, COPD, CAD s/p CABG, HTN, DM2, HLD, ongoing progressive dyspnea since early August, seen in pulmonologist office on 8/24 acute exacerbation of asthma/COPD, prednisone  taper was prescribed but patient did not fill the prescription, presented to the ED on 8/23 with worsening dyspnea.  Admitted for asthma/COPD exacerbation, clinically improved   Assessment & Plan:   Asthma/COPD exacerbation Former tobacco use In ED with sustained tachycardia up to high 120s, transient hypotension with BP as low as 81/55, no sustained tachypnea, not hypoxic.  Since then, vital signs have normalized Serial HS Troponin I x 2 negative (10 > 9 > 7 > 6) Elevated lactate has resolved.  CRP 0.6.  Procalcitonin 0.71. CTA chest: No PE or acute findings Treating with prednisone  40 mg daily, empiric IV ceftriaxone  and azithromycin  (although ceftriaxone  p.o. by itself should be enough), DuoNebs every 6 hours, albuterol  nebs as needed. Resume Pulmicort  nebs and Trelegy Ellipta  inhaler. Clinically improved.  Added flutter valve.  Continue to monitor closely for additional 24 hours and if sustained improvement and stability, possible DC home with close outpatient follow-up with primary Pulmonologist (Dr. [name]).  Hypotension/hypertension BP as low as 81/55 in ED.  Unclear etiology.?  Volume depletion and antihypertensives S/p 2.5 L IV fluid bolus.  Blood pressures have improved.  Currently controlled off of antihypertensives Continue to hold Toprol -XL 12.5 mg daily and Benicar  40 mg daily for now with close monitoring of blood pressures. Sepsis ruled out. TSH normal.  Dyslipidemia Continue atorvastatin  40 mg  daily  Depression Continue Wellbutrin  XL and Celexa   Type II DM Hold glipizide .  Continue NovoLog  SSI. A1c of 6.1 suggest tight control for this elderly male.  GERD Continue home dose Protonix .  Hold Pepcid .  CAD s/p CABG No anginal symptoms.  Continue atorvastatin .  Hold Toprol -XL due to soft blood pressures.  Anemia No overt bleeding.  Follow CBC in a.m.  Leukocytosis Suspected due to steroids.  Follow CBC in AM.   Body mass index is 28.28 kg/m.   DVT prophylaxis: heparin  injection 5,000 Units Start: 06/12/24 1400     Code Status: Full Code:  Family Communication: None at bedside Disposition:  Status is: Observation The patient remains OBS appropriate and will d/c before 2 midnights.     Consultants:   None  Procedures:     Subjective:  Seen early this morning.  Patient's RN at bedside.  Patient reports that he feels much better with dyspnea significantly improved and breathing almost close to his baseline.  Denies cough or chest pain.  Objective:   Vitals:   06/13/24 0253 06/13/24 0351 06/13/24 0802 06/13/24 0804  BP:  115/86  127/76  Pulse:  85 75 70  Resp:  20 16   Temp:  (!) 97.5 F (36.4 C)  97.6 F (36.4 C)  TempSrc:  Oral    SpO2: 99% 100% 100% 100%  Weight:      Height:        General exam: Elderly male, moderately built and overweight lying comfortably propped up in bed without distress. Respiratory system: Clear to auscultation except occasional basal crackles but no wheezing or rhonchi.  No increased work of breathing. Cardiovascular system: S1 & S2 heard, RRR.  No JVD, murmurs, rubs, gallops or clicks. No pedal edema.  Telemetry personally reviewed: Sinus rhythm with BBB morphology. Gastrointestinal system: Abdomen is nondistended, soft and nontender. No organomegaly or masses felt. Normal bowel sounds heard. Central nervous system: Alert and oriented. No focal neurological deficits. Extremities: Symmetric 5 x 5 power. Skin: No rashes,  lesions or ulcers Psychiatry: Judgement and insight appear normal. Mood & affect appropriate.     Data Reviewed:   I have personally reviewed following labs and imaging studies   CBC: Recent Labs  Lab 06/12/24 0814 06/13/24 0327  WBC 14.2* 21.8*  HGB 12.6* 11.4*  HCT 38.7* 35.0*  MCV 97.2 98.0  PLT 249 205    Basic Metabolic Panel: Recent Labs  Lab 06/12/24 0814 06/12/24 1307 06/13/24 0327  NA 137  --  138  K 4.2  --  4.3  CL 102  --  106  CO2 23  --  24  GLUCOSE 154*  --  134*  BUN 27*  --  19  CREATININE 1.36*  --  1.21  CALCIUM  8.9  --  8.4*  MG  --  2.1  --     Liver Function Tests: Recent Labs  Lab 06/12/24 1135 06/13/24 0327  AST 19 14*  ALT 21 18  ALKPHOS 63 55  BILITOT 0.6 0.5  PROT 4.8* 4.8*  ALBUMIN 2.7* 2.6*    CBG: Recent Labs  Lab 06/12/24 1651 06/12/24 2039 06/13/24 0803  GLUCAP 323* 178* 117*    Microbiology Studies:   Recent Results (from the past 240 hours)  Culture, blood (routine x 2)     Status: None (Preliminary result)   Collection Time: 06/12/24  8:50 AM   Specimen: BLOOD LEFT ARM  Result Value Ref Range Status   Specimen Description BLOOD LEFT ARM  Final   Special Requests   Final    BOTTLES DRAWN AEROBIC AND ANAEROBIC Blood Culture adequate volume   Culture   Final    NO GROWTH < 24 HOURS Performed at [facility], [address] [place], [place] [address]    Report Status PENDING  Incomplete  Culture, blood (routine x 2)     Status: None (Preliminary result)   Collection Time: 06/12/24  9:23 AM   Specimen: BLOOD RIGHT ARM  Result Value Ref Range Status   Specimen Description BLOOD RIGHT ARM  Final   Special Requests   Final    BOTTLES DRAWN AEROBIC AND ANAEROBIC Blood Culture adequate volume   Culture   Final    NO GROWTH < 24 HOURS Performed at [facility], [address], [place], [place] [address]    Report Status PENDING  Incomplete  Resp panel by RT-PCR (RSV, Flu A&B, Covid) Urine,  Clean Catch     Status: None   Collection Time: 06/12/24  9:23 AM   Specimen: Urine, Clean Catch; Nasal Swab  Result Value Ref Range Status   SARS Coronavirus 2 by RT PCR NEGATIVE NEGATIVE Final   Influenza A by PCR NEGATIVE NEGATIVE Final   Influenza B by PCR NEGATIVE NEGATIVE Final    Comment: (NOTE) The Xpert Xpress SARS-CoV-2/FLU/RSV plus assay is intended as an aid in the diagnosis of influenza from Nasopharyngeal swab specimens and should not be used as a sole basis for treatment. Nasal washings and aspirates are unacceptable for Xpert Xpress SARS-CoV-2/FLU/RSV testing.  Fact Sheet for Patients: BloggerCourse.com  Fact Sheet for Healthcare Providers: SeriousBroker.it  This test is not yet approved  or cleared by the [place]  FDA and has been authorized for detection and/or diagnosis of SARS-CoV-2 by FDA under an Emergency Use Authorization (EUA). This EUA will remain in effect (meaning this test can be used) for the duration of the COVID-19 declaration under Section 564(b)(1) of the Act, 21 U.S.C. section 360bbb-3(b)(1), unless the authorization is terminated or revoked.     Resp Syncytial Virus by PCR NEGATIVE NEGATIVE Final    Comment: (NOTE) Fact Sheet for Patients: BloggerCourse.com  Fact Sheet for Healthcare Providers: SeriousBroker.it  This test is not yet approved or cleared by the [place]  FDA and has been authorized for detection and/or diagnosis of SARS-CoV-2 by FDA under an Emergency Use Authorization (EUA). This EUA will remain in effect (meaning this test can be used) for the duration of the COVID-19 declaration under Section 564(b)(1) of the Act, 21 U.S.C. section 360bbb-3(b)(1), unless the authorization is terminated or revoked.  Performed at [facility], [address] [place], [place] [address]     Radiology Studies:  CT Angio  Chest PE W/Cm &/Or Wo Cm Result Date: 06/12/2024 CLINICAL DATA:  Pulmonary embolism (PE) suspected, low to intermediate prob, positive D-dimer EXAM: CT ANGIOGRAPHY CHEST WITH CONTRAST TECHNIQUE: Multidetector CT imaging of the chest was performed using the standard protocol during bolus administration of intravenous contrast. Multiplanar CT image reconstructions and MIPs were obtained to evaluate the vascular anatomy. RADIATION DOSE REDUCTION: This exam was performed according to the departmental dose-optimization program which includes automated exposure control, adjustment of the mA and/or kV according to patient size and/or use of iterative reconstruction technique. CONTRAST:  75mL OMNIPAQUE  IOHEXOL  350 MG/ML SOLN COMPARISON:  CT chest dated 07/04/2023. FINDINGS: Cardiovascular: Satisfactory opacification of the pulmonary arteries to the segmental level. No evidence of pulmonary embolism. Normal heart size. No pericardial effusion. Multivessel coronary artery calcifications. Thoracic aorta is normal in caliber with atherosclerotic calcification. Mediastinum/Nodes: No enlarged mediastinal, hilar, or axillary lymph nodes. Thyroid  gland, trachea, and esophagus demonstrate no significant findings. Lungs/Pleura: Centrilobular emphysema. Postoperative changes related to left upper lobectomy. Left lower lobe calcified granuloma. No focal consolidation, pleural effusion, or pneumothorax. Upper Abdomen: No acute abnormality. Punctate left upper pole renal calculus. Musculoskeletal: No acute osseous abnormality. Prior left thoracotomy. Moderate to advanced degenerative changes of the bilateral glenohumeral joints. Degenerative changes of the mid to lower thoracic spine. Review of the MIP images confirms the above findings. IMPRESSION: 1. No evidence of pulmonary embolism. 2. No acute intrathoracic findings. 3. Multivessel coronary artery calcifications. 4. Aortic Atherosclerosis (ICD10-I70.0) and Emphysema (ICD10-J43.9).  Electronically Signed   By: Harrietta Sherry M.D.   On: 06/12/2024 12:07   DG Chest Portable 1 View Result Date: 06/12/2024 CLINICAL DATA:  Shortness of breath.  COPD. EXAM: PORTABLE CHEST 1 VIEW COMPARISON:  06/04/2024 FINDINGS: Lordotic positioning noted. The heart size and mediastinal contours are within normal limits. Both lungs are clear. IMPRESSION: No active disease. Electronically Signed   By: Norleen DELENA Kil M.D.   On: 06/12/2024 09:27    Scheduled Meds:    atorvastatin   40 mg Oral Daily   azithromycin   500 mg Oral Daily   budesonide   0.5 mg Nebulization BID   buPROPion   300 mg Oral q morning   citalopram   40 mg Oral q morning   heparin   5,000 Units Subcutaneous Q8H   insulin  aspart  0-9 Units Subcutaneous TID WC   ipratropium-albuterol   3 mL Nebulization Q6H   pantoprazole   40 mg Oral Daily   predniSONE   40 mg Oral Q breakfast   sodium chloride  flush  3 mL Intravenous Q12H   sodium chloride  flush  3 mL Intravenous Q12H    Continuous Infusions:    sodium chloride      cefTRIAXone  (ROCEPHIN )  IV       LOS: 0 days     Trenda Mar, MD,  FACP, [facility], [facility], [facility]   [facility] [place]      To contact the attending provider between 7A-7P or the covering provider during after hours 7P-7A, please log into the web site [email or web address] and access using universal [place] password for that web site. If you do not have the password, please call the hospital operator.  06/13/2024, 9:26 AM

## 2024-06-13 NOTE — Plan of Care (Signed)
  Problem: Coping: Goal: Ability to adjust to condition or change in health will improve Outcome: Progressing   Problem: Fluid Volume: Goal: Ability to maintain a balanced intake and output will improve Outcome: Progressing   Problem: Nutritional: Goal: Maintenance of adequate nutrition will improve Outcome: Progressing   Problem: Skin Integrity: Goal: Risk for impaired skin integrity will decrease Outcome: Progressing   Problem: Tissue Perfusion: Goal: Adequacy of tissue perfusion will improve Outcome: Progressing   Problem: Education: Goal: Knowledge of General Education information will improve Description: Including pain rating scale, medication(s)/side effects and non-pharmacologic comfort measures Outcome: Progressing   Problem: Clinical Measurements: Goal: Will remain free from infection Outcome: Progressing   Problem: Clinical Measurements: Goal: Respiratory complications will improve Outcome: Progressing

## 2024-06-13 NOTE — Care Management Obs Status (Signed)
 MEDICARE OBSERVATION STATUS NOTIFICATION   Patient Details  Name: Gary Wright MRN: [id number] Date of Birth: 04-28-1946   Medicare Observation Status Notification Given:  Yes    Carletha Spruce, RN 06/13/2024, 11:24 AM

## 2024-06-14 ENCOUNTER — Ambulatory Visit: Payer: Self-pay | Admitting: Pulmonary Disease

## 2024-06-14 DIAGNOSIS — J441 Chronic obstructive pulmonary disease with (acute) exacerbation: Secondary | ICD-10-CM | POA: Diagnosis not present

## 2024-06-14 LAB — CBC
HCT: 35.1 % — ABNORMAL LOW (ref 39.0–52.0)
Hemoglobin: 11.6 g/dL — ABNORMAL LOW (ref 13.0–17.0)
MCH: 32 pg (ref 26.0–34.0)
MCHC: 33 g/dL (ref 30.0–36.0)
MCV: 96.7 fL (ref 80.0–100.0)
Platelets: 212 K/uL (ref 150–400)
RBC: 3.63 MIL/uL — ABNORMAL LOW (ref 4.22–5.81)
RDW: 13.6 % (ref 11.5–15.5)
WBC: 14.6 K/uL — ABNORMAL HIGH (ref 4.0–10.5)
nRBC: 0 % (ref 0.0–0.2)

## 2024-06-14 LAB — URINE CULTURE: Culture: >=100000 — AB

## 2024-06-14 LAB — ANA W/REFLEX IF POSITIVE: Anti Nuclear Antibody (ANA): NEGATIVE

## 2024-06-14 LAB — GLUCOSE, CAPILLARY: Glucose-Capillary: 107 mg/dL — ABNORMAL HIGH (ref 70–99)

## 2024-06-14 MED ORDER — PREDNISONE 10 MG PO TABS: ORAL_TABLET | ORAL | 0 refills | Status: DC

## 2024-06-14 NOTE — TOC CM/SW Note (Addendum)
 Transition of Care [facility]) - Inpatient Brief Assessment   Patient Details  Name: Gary Wright MRN: [id number] Date of Birth: 09-29-1946  [facility] of [facility]) CM/SW Contact:    Lauraine FORBES Saa, LCSWA Phone Number: 06/14/2024, 9:28 AM   Clinical Narrative:  9:28 AM Per chart review, patient resides at home with spouse. Patient has a PCP and insurance. Patient does not have SNF of HH history. Patient has DME (oxygen) history with [employer]. Patient's preferred pharmacy's are [facility], [facility], [facility] [address], [place] [address] [place], and [address] [place]. TOC consult was placed for HH/DME needs and HF/COPD Consult. TOC will continue to follow and be available to assist.   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

## 2024-06-14 NOTE — Discharge Instructions (Signed)

## 2024-06-14 NOTE — Telephone Encounter (Signed)
 FYI Only or Action Required?: FYI only for provider.  Patient is followed in Pulmonology for COPD, last seen on 06/09/2024 by [name] Dorn NOVAK, MD.  Called Nurse Triage reporting Hospitalization Follow-up.  Symptoms began 2 days ago.  Interventions attempted: Other: was admitted to the hospital.  Symptoms are: improved.  Triage Disposition: Home Care  Patient/caregiver understands and will follow disposition?: Yes  Hospital follow-up appointment scheduled per his discharge instructions.          Copied from CRM #[id number]. Topic: Clinical - Red Word Triage >> Jun 14, 2024  3:09 PM Rilla NOVAK wrote: Red Word that prompted transfer to Nurse Triage:  Weakness, wheezy, Discharged today.      Reason for Disposition  [1] Discharged from hospital after treatment for COPD AND [2] symptoms improved (feels better)  (all other triage questions negative)  Answer Assessment - Initial Assessment Questions 1. MAIN CONCERN OR SYMPTOM: What's the main symptom you're worried about? (e.g., breathing problem, cough, sputum change, wheezing) What question do you have?     Was told to make a follow-up appointment  2. ONSET: When did the symptoms start?      2 days ago  3. BETTER-SAME-WORSE: Are you getting better, staying the same, or getting worse compared to the day you were discharged?     Better 4. DISCHARGE DATE: What date were you discharged from the hospital?     06/14/24 5. DISCHARGE DOCTOR: Who is the main doctor taking care of you now?     Dr. Judeth 6. DISCHARGE APPOINTMENT: Have you scheduled a follow-up discharge appointment with your doctor?     No 7. DISCHARGE MEDICINES: Did the doctor who discharged you order any new medicines for you to use? If yes, have you filled the prescription and started taking the medicine?      No new medications  10. BREATHING DIFFICULTY: Are you having any trouble breathing? If Yes, ask: How bad is it?  (e.g., none, mild,  moderate, severe)        I'm breathing a lot better 11. FEVER: Do you have a fever? If Yes, ask: What is your temperature, how was it measured, and when did it start?       No 13. OTHER SYMPTOMS: Do you have any other symptoms? (e.g., fever)       Some mild weakness after hospitalization  Protocols used: COPD Post-Hospitalization Follow-up Call-A-AH

## 2024-06-14 NOTE — Discharge Summary (Signed)
 Physician Discharge Summary  Gary Wright FMW:[id number] DOB: 12-15-1945  PCP: Lucius Krabbe, NP  Admitted from: Home Discharged to: Home  Admit date: 06/12/2024 Discharge date: 06/14/2024  Recommendations for Outpatient Follow-up:    Follow-up Information     Lucius Krabbe, NP. Schedule an appointment as soon as possible for a visit in 1 week(s).   Specialty: Family Medicine Why: To be seen with repeat labs (CBC & BMP). Contact information: [address] [place] [address] [phone]         [name] Dorn NOVAK, MD. Schedule an appointment as soon as possible for a visit in 1 week(s).   Specialty: Pulmonary Disease Contact information: [address] [place] [address] [phone]                  Home Health: None recommended by PT    Equipment/Devices: None    Discharge Condition: Improved and stable.   Code Status: Full Code Diet recommendation:  Discharge Diet Orders (From admission, onward)     Start     Ordered   06/14/24 0000  Diet - low sodium heart healthy        06/14/24 0748   06/14/24 0000  Diet Carb Modified        06/14/24 0748             Discharge Diagnoses:  Principal Problem:   SOB (shortness of breath) Active Problems:   Depression   Essential hypertension   Chronic kidney disease, stage 3b [facility])   Brief Hospital Course:  78 year old married male, independent, medical history significant for, former tobacco use, COPD, CAD s/p CABG, HTN, DM2, HLD, ongoing progressive dyspnea since early August, seen in pulmonologist office on 8/24 acute exacerbation of asthma/COPD, prednisone  taper was prescribed but patient did not fill the prescription, presented to the ED on 8/23 with worsening dyspnea.   Admitted for asthma/COPD exacerbation, clinically improved     Assessment & Plan:    Asthma/COPD exacerbation Former tobacco use In ED with sustained tachycardia up to high 120s,  transient hypotension with BP as low as 81/55, no sustained tachypnea, not hypoxic.  Since then, vital signs have normalized Serial HS Troponin I x 2 negative (10 > 9 > 7 > 6) Elevated lactate has resolved.  CRP 0.6.  Procalcitonin 0.71. CTA chest: No PE or acute findings Treated with prednisone  40 mg daily, empiric IV ceftriaxone  and azithromycin  (although ceftriaxone  p.o. by itself should be enough), DuoNebs every 6 hours, albuterol  nebs as needed. Resumed Pulmicort  nebs and Trelegy Ellipta  inhaler. Clinically has done quite well.  Reports that his breathing is back to his baseline apart from occasional transient wheezing. TTE 8/24 results appreciated: LVEF 60-65% and grade 1 diastolic dysfunction.  Discharge to complete p.o. prednisone  taper, PTA course of oral azithromycin , prior home respiratory meds as noted below with close outpatient follow-up with PCP/pulmonology.   Hypotension/hypertension BP as low as 81/55 in ED.  Unclear etiology.?  Volume depletion and antihypertensives S/p 2.5 L IV fluid bolus. Blood pressures have normalized and even starting to gradually creep up.  Continue prior to admission chronic antihypertensives including low-dose Toprol  and Benicar . Sepsis ruled out. TSH normal.   Dyslipidemia Continue atorvastatin  40 mg daily   Depression Continue Wellbutrin  XL and Celexa    Type II DM A1c of 6.1 suggest tight control for this elderly male. Continue PTA meds with close outpatient follow-up with PCP to consider reducing dose of his oral hypoglycemics.   GERD  Continue home dose Protonix  and Pepcid    CAD s/p CABG No anginal symptoms.  Continue atorvastatin  and Toprol -XL.   Anemia No overt bleeding.  Hemoglobin stable in the 11 g range.  Outpatient follow-up.   Leukocytosis Suspected due to steroids.  WBC has dropped from 21.8-14.6.  Outpatient follow-up.  Asymptomatic bacteriuria No UTI symptoms.  Urine microscopy not impressive for UTI, had rare  bacteria, 21-50 WBC/hpf, no nitrites. Preliminary urine culture shows >100 K colonies of E. coli, sensitivities pending. Complete third day of IV ceftriaxone  today to complete antibiotic course.     Body mass index is 28.28 kg/m.       Consultants:   None   Procedures:       Discharge Instructions  Discharge Instructions     Call MD for:  difficulty breathing, headache or visual disturbances   Complete by: As directed    Call MD for:  extreme fatigue   Complete by: As directed    Call MD for:  persistant dizziness or light-headedness   Complete by: As directed    Call MD for:  severe uncontrolled pain   Complete by: As directed    Call MD for:  temperature >100.4   Complete by: As directed    Diet - low sodium heart healthy   Complete by: As directed    Diet Carb Modified   Complete by: As directed    Increase activity slowly   Complete by: As directed         Medication List     TAKE these medications    albuterol  (2.5 MG/3ML) 0.083% nebulizer solution Commonly known as: PROVENTIL  Take 3 mLs (2.5 mg total) by nebulization every 4 (four) hours as needed for wheezing or shortness of breath.   albuterol  108 (90 Base) MCG/ACT inhaler Commonly known as: VENTOLIN  HFA Inhale 1-2 puffs into the lungs every 4 (four) hours as needed for wheezing or shortness of breath.   atorvastatin  40 MG tablet Commonly known as: LIPITOR TAKE 1 TABLET(40 MG) BY MOUTH DAILY AT 6 PM   azithromycin  250 MG tablet Commonly known as: Zithromax  Take as directed   budesonide  0.5 MG/2ML nebulizer solution Commonly known as: Pulmicort  Take 2 mLs (0.5 mg total) by nebulization 2 (two) times daily.   buPROPion  300 MG 24 hr tablet Commonly known as: WELLBUTRIN  XL Take 300 mg by mouth every morning.   citalopram  40 MG tablet Commonly known as: CELEXA  Take 40 mg by mouth every morning.   clotrimazole -betamethasone  cream Commonly known as: LOTRISONE  Apply 1 Application topically 2  (two) times daily. Apply to buttocks and left ankle & heel.   cyclobenzaprine  5 MG tablet Commonly known as: FLEXERIL  Take 1 tablet (5 mg total) by mouth 3 (three) times daily as needed for muscle spasms.   diphenoxylate -atropine  2.5-0.025 MG tablet Commonly known as: Lomotil  Take 1-2 tablets by mouth 4 (four) times daily as needed for diarrhea or loose stools (Max dose of 8 tabs in 24 hours).   Dupixent  300 MG/2ML Soaj Generic drug: Dupilumab  Inject 300 mg into the skin every 14 (fourteen) days.   famotidine  20 MG tablet Commonly known as: Pepcid  One after supper What changed:  how much to take how to take this when to take this additional instructions   glipiZIDE  2.5 MG 24 hr tablet Commonly known as: GLUCOTROL  XL TAKE 1 TABLET(2.5 MG) BY MOUTH TWICE DAILY AFTER A MEAL   ipratropium-albuterol  0.5-2.5 (3) MG/3ML Soln Commonly known as: DUONEB Take 3 mLs by  nebulization 4 (four) times daily.   metoprolol  succinate 25 MG 24 hr tablet Commonly known as: TOPROL -XL TAKE 1/2 TABLET(12.5 MG) BY MOUTH DAILY   olmesartan  40 MG tablet Commonly known as: BENICAR  Take 1 tablet (40 mg total) by mouth daily.   ondansetron  4 MG tablet Commonly known as: ZOFRAN  Take 1 tablet (4 mg total) by mouth every 8 (eight) hours as needed for nausea or vomiting.   pantoprazole  40 MG tablet Commonly known as: PROTONIX  One tablet by mouth daily 30 to 60 minutes before the first meal of the day What changed:  how much to take how to take this when to take this additional instructions   predniSONE  10 MG tablet Commonly known as: DELTASONE  Take 4 tabs (40 mg total) daily x 3 days, then 3 tabs (30 mg total) daily x 3 days, then 2 tabs (20 mg total) daily x 3 days, then 1 tab (10 mg total) daily x 3 days, then stop. Start taking on: June 15, 2024 What changed: See the new instructions.   Trelegy Ellipta  200-62.5-25 MCG/ACT Aepb Generic drug: Fluticasone -Umeclidin-Vilant Inhale 1 puff  into the lungs daily.   triamcinolone  ointment 0.5 % Commonly known as: KENALOG  Apply topically 2 (two) times daily.       No Known Allergies    Procedures/Studies: ECHOCARDIOGRAM COMPLETE Result Date: 06/13/2024    ECHOCARDIOGRAM REPORT   Patient Name:   Gary Wright Date of Exam: 06/13/2024 Medical Rec #:  [id number]      Height:       68.0 in Accession #:    [id number]     Weight:       186.0 lb Date of Birth:  [birth year]     BSA:          1.982 m Patient Age:    77 years       BP:           106/67 mmHg Patient Gender: M              HR:           90 bpm. Exam Location:  Inpatient Procedure: 2D Echo (Both Spectral and Color Flow Doppler were utilized during            procedure). Indications:    chest pain  History:        Patient has prior history of Echocardiogram examinations, most                 recent 08/04/2023. CAD, chronic kidney disease,                 Signs/Symptoms:Dyspnea; Risk Factors:Dyslipidemia and                 Hypertension.  Sonographer:    Tinnie Barefoot RDCS Referring Phys: [id number] Cheri Ayotte D Dorian Duval IMPRESSIONS  1. Left ventricular ejection fraction, by estimation, is 60 to 65%. The left ventricle has normal function. The left ventricle has no regional wall motion abnormalities. Left ventricular diastolic parameters are consistent with Grade I diastolic dysfunction (impaired relaxation).  2. Right ventricular systolic function is normal. The right ventricular size is normal. There is normal pulmonary artery systolic pressure.  3. The mitral valve is normal in structure. No evidence of mitral valve regurgitation. No evidence of mitral stenosis.  4. The tricuspid valve is abnormal.  5. The aortic valve is tricuspid. There is mild calcification of the aortic valve. There is mild thickening of the aortic valve. Aortic valve  regurgitation is not visualized. No aortic stenosis is present.  6. The inferior vena cava is normal in size with greater than 50% respiratory variability,  suggesting right atrial pressure of 3 mmHg. FINDINGS  Left Ventricle: Left ventricular ejection fraction, by estimation, is 60 to 65%. The left ventricle has normal function. The left ventricle has no regional wall motion abnormalities. The left ventricular internal cavity size was normal in size. There is  no left ventricular hypertrophy. Left ventricular diastolic parameters are consistent with Grade I diastolic dysfunction (impaired relaxation). Normal left ventricular filling pressure. Right Ventricle: The right ventricular size is normal. Right vetricular wall thickness was not well visualized. Right ventricular systolic function is normal. There is normal pulmonary artery systolic pressure. The tricuspid regurgitant velocity is 2.32 m/s, and with an assumed right atrial pressure of 3 mmHg, the estimated right ventricular systolic pressure is 24.5 mmHg. Left Atrium: Left atrial size was normal in size. Right Atrium: Right atrial size was normal in size. Pericardium: There is no evidence of pericardial effusion. Mitral Valve: The mitral valve is normal in structure. No evidence of mitral valve regurgitation. No evidence of mitral valve stenosis. Tricuspid Valve: The tricuspid valve is abnormal. Tricuspid valve regurgitation is mild . No evidence of tricuspid stenosis. Aortic Valve: The aortic valve is tricuspid. There is mild calcification of the aortic valve. There is mild thickening of the aortic valve. There is mild aortic valve annular calcification. Aortic valve regurgitation is not visualized. No aortic stenosis  is present. Aortic valve mean gradient measures 3.5 mmHg. Aortic valve peak gradient measures 7.2 mmHg. Aortic valve area, by VTI measures 1.76 cm. Pulmonic Valve: The pulmonic valve was not well visualized. Pulmonic valve regurgitation is not visualized. No evidence of pulmonic stenosis. Aorta: The aortic root and ascending aorta are structurally normal, with no evidence of dilitation. Venous:  The inferior vena cava is normal in size with greater than 50% respiratory variability, suggesting right atrial pressure of 3 mmHg. IAS/Shunts: No atrial level shunt detected by color flow Doppler.  LEFT VENTRICLE PLAX 2D LVIDd:         5.00 cm     Diastology LVIDs:         3.20 cm     LV e' medial:    6.20 cm/s LV PW:         0.80 cm     LV E/e' medial:  11.6 LV IVS:        0.90 cm     LV e' lateral:   8.49 cm/s LVOT diam:     1.80 cm     LV E/e' lateral: 8.4 LV SV:         44 LV SV Index:   22 LVOT Area:     2.54 cm  LV Volumes (MOD) LV vol d, MOD A4C: 79.9 ml LV vol s, MOD A4C: 41.4 ml LV SV MOD A4C:     79.9 ml RIGHT VENTRICLE             IVC RV Basal diam:  2.40 cm     IVC diam: 1.90 cm RV S prime:     11.60 cm/s TAPSE (M-mode): 2.4 cm LEFT ATRIUM             Index        RIGHT ATRIUM           Index LA diam:        3.50 cm 1.77 cm/m   RA Area:  14.30 cm LA Vol (A2C):   46.2 ml 23.31 ml/m  RA Volume:   36.30 ml  18.32 ml/m LA Vol (A4C):   42.2 ml 21.30 ml/m LA Biplane Vol: 46.1 ml 23.26 ml/m  AORTIC VALVE AV Area (Vmax):    1.75 cm AV Area (Vmean):   1.83 cm AV Area (VTI):     1.76 cm AV Vmax:           134.38 cm/s AV Vmean:          85.016 cm/s AV VTI:            0.248 m AV Peak Grad:      7.2 mmHg AV Mean Grad:      3.5 mmHg LVOT Vmax:         92.30 cm/s LVOT Vmean:        61.300 cm/s LVOT VTI:          0.172 m LVOT/AV VTI ratio: 0.69  AORTA Ao Root diam: 3.00 cm Ao Asc diam:  3.00 cm MV E velocity: 71.70 cm/s   TRICUSPID VALVE MV A velocity: 104.00 cm/s  TR Peak grad:   21.5 mmHg MV E/A ratio:  0.69         TR Vmax:        232.00 cm/s                              SHUNTS                             Systemic VTI:  0.17 m                             Systemic Diam: 1.80 cm Dorn Ross MD Electronically signed by Dorn Ross MD Signature Date/Time: 06/13/2024/6:32:55 PM    Final    CT Angio Chest PE W/Cm &/Or Wo Cm Result Date: 06/12/2024 CLINICAL DATA:  Pulmonary embolism (PE) suspected,  low to intermediate prob, positive D-dimer EXAM: CT ANGIOGRAPHY CHEST WITH CONTRAST TECHNIQUE: Multidetector CT imaging of the chest was performed using the standard protocol during bolus administration of intravenous contrast. Multiplanar CT image reconstructions and MIPs were obtained to evaluate the vascular anatomy. RADIATION DOSE REDUCTION: This exam was performed according to the departmental dose-optimization program which includes automated exposure control, adjustment of the mA and/or kV according to patient size and/or use of iterative reconstruction technique. CONTRAST:  75mL OMNIPAQUE  IOHEXOL  350 MG/ML SOLN COMPARISON:  CT chest dated 07/04/2023. FINDINGS: Cardiovascular: Satisfactory opacification of the pulmonary arteries to the segmental level. No evidence of pulmonary embolism. Normal heart size. No pericardial effusion. Multivessel coronary artery calcifications. Thoracic aorta is normal in caliber with atherosclerotic calcification. Mediastinum/Nodes: No enlarged mediastinal, hilar, or axillary lymph nodes. Thyroid  gland, trachea, and esophagus demonstrate no significant findings. Lungs/Pleura: Centrilobular emphysema. Postoperative changes related to left upper lobectomy. Left lower lobe calcified granuloma. No focal consolidation, pleural effusion, or pneumothorax. Upper Abdomen: No acute abnormality. Punctate left upper pole renal calculus. Musculoskeletal: No acute osseous abnormality. Prior left thoracotomy. Moderate to advanced degenerative changes of the bilateral glenohumeral joints. Degenerative changes of the mid to lower thoracic spine. Review of the MIP images confirms the above findings. IMPRESSION: 1. No evidence of pulmonary embolism. 2. No acute intrathoracic findings. 3. Multivessel coronary artery calcifications. 4. Aortic Atherosclerosis (ICD10-I70.0) and Emphysema (ICD10-J43.9). Electronically Signed  By: Harrietta Sherry M.D.   On: 06/12/2024 12:07   DG Chest Portable 1  View Result Date: 06/12/2024 CLINICAL DATA:  Shortness of breath.  COPD. EXAM: PORTABLE CHEST 1 VIEW COMPARISON:  06/04/2024 FINDINGS: Lordotic positioning noted. The heart size and mediastinal contours are within normal limits. Both lungs are clear. IMPRESSION: No active disease. Electronically Signed   By: Norleen DELENA Kil M.D.   On: 06/12/2024 09:27     Subjective: Reports that his breathing is back to his baseline apart from occasional transient wheezing.  No worsening of breathing even on ambulating in the room and yesterday in the halls.  No chest pain reported.  Discharge Exam:  Vitals:   06/13/24 2027 06/13/24 2056 06/13/24 2351 06/14/24 0421  BP: 139/79  136/76 (!) 138/93  Pulse: 90  83 88  Resp: 20  20 20   Temp: 97.7 F (36.5 C)  97.9 F (36.6 C) 97.6 F (36.4 C)  TempSrc:   Oral Oral  SpO2: 99% 97% 96% 98%  Weight:      Height:        General exam: Elderly male, moderately built and overweight lying comfortably propped up in bed without distress. Respiratory system: Clear to auscultation except and occasional rhonchi heard to posteriorly.  No crackles.  Good air entry bilaterally.  No increased work of breathing. Cardiovascular system: S1 & S2 heard, RRR. No JVD, murmurs, rubs, gallops or clicks. No pedal edema.  Telemetry personally reviewed: Sinus rhythm with?  BBB morphology. Gastrointestinal system: Abdomen is nondistended, soft and nontender. No organomegaly or masses felt. Normal bowel sounds heard. Central nervous system: Alert and oriented. No focal neurological deficits. Extremities: Symmetric 5 x 5 power. Skin: No rashes, lesions or ulcers Psychiatry: Judgement and insight appear normal. Mood & affect appropriate.     The results of significant diagnostics from this hospitalization (including imaging, microbiology, ancillary and laboratory) are listed below for reference.     Microbiology: Recent Results (from the past 240 hours)  Culture, blood (routine x 2)      Status: None (Preliminary result)   Collection Time: 06/12/24  8:50 AM   Specimen: BLOOD LEFT ARM  Result Value Ref Range Status   Specimen Description BLOOD LEFT ARM  Final   Special Requests   Final    BOTTLES DRAWN AEROBIC AND ANAEROBIC Blood Culture adequate volume   Culture   Final    NO GROWTH < 24 HOURS Performed at [facility], [address] [place], [place] [address]    Report Status PENDING  Incomplete  Culture, blood (routine x 2)     Status: None (Preliminary result)   Collection Time: 06/12/24  9:23 AM   Specimen: BLOOD RIGHT ARM  Result Value Ref Range Status   Specimen Description BLOOD RIGHT ARM  Final   Special Requests   Final    BOTTLES DRAWN AEROBIC AND ANAEROBIC Blood Culture adequate volume   Culture   Final    NO GROWTH < 24 HOURS Performed at [facility], [address] [place], [place] [address]    Report Status PENDING  Incomplete  Resp panel by RT-PCR (RSV, Flu A&B, Covid) Urine, Clean Catch     Status: None   Collection Time: 06/12/24  9:23 AM   Specimen: Urine, Clean Catch; Nasal Swab  Result Value Ref Range Status   SARS Coronavirus 2 by RT PCR NEGATIVE NEGATIVE Final   Influenza A by PCR NEGATIVE NEGATIVE Final   Influenza  B by PCR NEGATIVE NEGATIVE Final    Comment: (NOTE) The Xpert Xpress SARS-CoV-2/FLU/RSV plus assay is intended as an aid in the diagnosis of influenza from Nasopharyngeal swab specimens and should not be used as a sole basis for treatment. Nasal washings and aspirates are unacceptable for Xpert Xpress SARS-CoV-2/FLU/RSV testing.  Fact Sheet for Patients: BloggerCourse.com  Fact Sheet for Healthcare Providers: SeriousBroker.it  This test is not yet approved or cleared by the [place]  FDA and has been authorized for detection and/or diagnosis of SARS-CoV-2 by FDA under an Emergency Use Authorization (EUA). This EUA will remain in effect (meaning  this test can be used) for the duration of the COVID-19 declaration under Section 564(b)(1) of the Act, 21 U.S.C. section 360bbb-3(b)(1), unless the authorization is terminated or revoked.     Resp Syncytial Virus by PCR NEGATIVE NEGATIVE Final    Comment: (NOTE) Fact Sheet for Patients: BloggerCourse.com  Fact Sheet for Healthcare Providers: SeriousBroker.it  This test is not yet approved or cleared by the [place]  FDA and has been authorized for detection and/or diagnosis of SARS-CoV-2 by FDA under an Emergency Use Authorization (EUA). This EUA will remain in effect (meaning this test can be used) for the duration of the COVID-19 declaration under Section 564(b)(1) of the Act, 21 U.S.C. section 360bbb-3(b)(1), unless the authorization is terminated or revoked.  Performed at [facility], [address], [place], [place] [address]   Urine Culture (for pregnant, neutropenic or urologic patients or patients with an indwelling urinary catheter)     Status: Abnormal (Preliminary result)   Collection Time: 06/12/24  1:47 PM   Specimen: Urine, Clean Catch  Result Value Ref Range Status   Specimen Description URINE, CLEAN CATCH  Final   Special Requests NONE  Final   Culture (A)  Final    >=100,000 COLONIES/mL ESCHERICHIA COLI SUSCEPTIBILITIES TO FOLLOW Performed at [facility], [address], [place], [place] [address]    Report Status PENDING  Incomplete     Labs: CBC: Recent Labs  Lab 06/12/24 0814 06/13/24 0327 06/14/24 0425  WBC 14.2* 21.8* 14.6*  HGB 12.6* 11.4* 11.6*  HCT 38.7* 35.0* 35.1*  MCV 97.2 98.0 96.7  PLT 249 205 212    Basic Metabolic Panel: Recent Labs  Lab 06/12/24 0814 06/12/24 1307 06/13/24 0327  NA 137  --  138  K 4.2  --  4.3  CL 102  --  106  CO2 23  --  24  GLUCOSE 154*  --  134*  BUN 27*  --  19  CREATININE 1.36*  --  1.21  CALCIUM  8.9  --  8.4*  MG  --  2.1  --      Liver Function Tests: Recent Labs  Lab 06/12/24 1135 06/13/24 0327  AST 19 14*  ALT 21 18  ALKPHOS 63 55  BILITOT 0.6 0.5  PROT 4.8* 4.8*  ALBUMIN 2.7* 2.6*    CBG: Recent Labs  Lab 06/12/24 2039 06/13/24 0803 06/13/24 1140 06/13/24 1648 06/13/24 2028  GLUCAP 178* 117* 161* 153* 148*    Hgb A1c Recent Labs    06/12/24 1313  HGBA1C 6.1*    Thyroid  function studies Recent Labs    06/12/24 1314  TSH 3.713     Urinalysis    Component Value Date/Time   COLORURINE YELLOW 06/12/2024 0923   APPEARANCEUR HAZY (A) 06/12/2024 0923   LABSPEC 1.013 06/12/2024 0923   PHURINE 5.0 06/12/2024 0923   GLUCOSEU  NEGATIVE 06/12/2024 0923   HGBUR MODERATE (A) 06/12/2024 0923   BILIRUBINUR NEGATIVE 06/12/2024 0923   KETONESUR NEGATIVE 06/12/2024 0923   PROTEINUR NEGATIVE 06/12/2024 0923   NITRITE NEGATIVE 06/12/2024 0923   LEUKOCYTESUR SMALL (A) 06/12/2024 0923   Left a VM message for patient's spouse.   Time coordinating discharge: 25 minutes  SIGNED:  Trenda Mar, MD,  FACP, [facility], [facility], [facility]   [facility] Advisor [place]     To contact the attending provider between 7A-7P or the covering provider during after hours 7P-7A, please log into the web site [email or web address] and access using universal  password for that web site. If you do not have the password, please call the hospital operator.

## 2024-06-14 NOTE — Progress Notes (Signed)
 DC order noted per MD. DC RN at bedside. Patient dressed with PIVs removed on assessment, ambulatory with cane ready for discharge. Pt states AVS printed/reviewed with primary RN. Skin intact. Patient states wife is appx 20 minutes away for pickup. CP/Edu resolved. No home/TOC meds. Patient agreeable and taken to the discharge lounge.

## 2024-06-14 NOTE — Plan of Care (Signed)

## 2024-06-15 ENCOUNTER — Encounter: Payer: Self-pay | Admitting: Pulmonary Disease

## 2024-06-15 ENCOUNTER — Telehealth: Payer: Self-pay

## 2024-06-15 ENCOUNTER — Encounter: Payer: Self-pay | Admitting: Acute Care

## 2024-06-15 ENCOUNTER — Ambulatory Visit: Admitting: Pulmonary Disease

## 2024-06-15 VITALS — BP 140/80 | HR 103 | Temp 98.8°F | Ht 69.0 in | Wt 173.0 lb

## 2024-06-15 DIAGNOSIS — J441 Chronic obstructive pulmonary disease with (acute) exacerbation: Secondary | ICD-10-CM

## 2024-06-15 DIAGNOSIS — J4489 Other specified chronic obstructive pulmonary disease: Secondary | ICD-10-CM | POA: Diagnosis not present

## 2024-06-15 MED ORDER — METHYLPREDNISOLONE ACETATE 80 MG/ML IJ SUSP
120.0000 mg | Freq: Once | INTRAMUSCULAR | Status: AC
  Administered 2024-06-15: 120 mg via INTRAMUSCULAR

## 2024-06-15 MED ORDER — PREDNISONE 10 MG PO TABS: 60.0000 mg | ORAL_TABLET | Freq: Every day | ORAL | 0 refills | Status: DC

## 2024-06-15 NOTE — Addendum Note (Signed)
 Addended by: MOODY RANCHER on: 06/15/2024 02:46 PM   Modules accepted: Orders

## 2024-06-15 NOTE — Transitions of Care (Post Inpatient/ED Visit) (Unsigned)
   06/15/2024  Name: Gary Wright MRN: [id number] DOB: May 20, 1946  Today's TOC FU Call Status: Today's TOC FU Call Status:: Unsuccessful Call (1st Attempt) Unsuccessful Call (1st Attempt) Date: 06/15/24  Attempted to reach the patient regarding the most recent Inpatient/ED visit.  Follow Up Plan: Additional outreach attempts will be made to reach the patient to complete the Transitions of Care (Post Inpatient/ED visit) call.   Signature Julian Lemmings, LPN [facility] Nurse Health Advisor Direct Dial [phone]

## 2024-06-15 NOTE — Progress Notes (Signed)
 Gary Wright    [id number]    1946/09/04  Primary Care Physician:Hudnell, Corean, NP  Referring Physician: Lucius Corean, NP [address] [place],  [place] [address]  Chief complaint: Acute visit for COPD exacerbation, posthospitalization follow-up  HPI: 78 y.o. who  has a past medical history of Acute dyspnea (03/24/2022), Acute exacerbation of chronic obstructive pulmonary disease (COPD) (HCC) (03/25/2022), Asthma, CAD (coronary artery disease) of artery bypass graft (06/13/2016), Chest pain, COPD (chronic obstructive pulmonary disease) (HCC), COPD with acute exacerbation (HCC) (11/02/2020), Depression (09/01/2007), Elevated troponin (06/13/2016), History of lung cancer (11/02/2020), Hyperlipidemia (11/02/2020), Hypertension, Prolonged QT interval (11/02/2020), Stress-induced cardiomyopathy (06/13/2016), and TOBACCO ABUSE (09/01/2007).   Discussed the use of AI scribe software for clinical note transcription with the patient, who gave verbal consent to proceed.  History of Present Illness Gary Wright is a 78 year old male with moderate COPD who presents with a recent exacerbation and hospitalization.  He is a patient of Dr. [name]   Chronic obstructive pulmonary disease exacerbation - Recent severe COPD exacerbation requiring ambulance intervention and overnight hospitalization - Described the episode as particularly severe: 'I thought this was the one.' - Persistent wheezing despite current treatment regimen - Currently on Trelegy, nebulized Pulmicort , and Dupixent  injections every fourteen days - Prednisone  taper initiated at 40 mg following hospitalization; initial dose not started prior to admission - Significant weakness since exacerbation: 'I've never felt this weak in my life.' - No recent tobacco use - Attempts to remain active by walking, but limited by reduced endurance  Pulmonary history - Moderate COPD - History of left upper lobectomy in  2020 for lung cancer - Previously on Daliresp  (roflumilast ), discontinued earlier this year - On Dupixent  for approximately 3.5 to 4 months - Quit smoking about 10 years ago  Infectious complications - Noted to have urinary tract infection due to E. coli during recent admission - Treated with intravenous ceftriaxone   Imaging and diagnostic studies - CT angiogram performed during recent hospitalization with no evidence of pulm embolism or pneumonia   Outpatient Encounter Medications as of 06/15/2024  Medication Sig   albuterol  (PROVENTIL ) (2.5 MG/3ML) 0.083% nebulizer solution Take 3 mLs (2.5 mg total) by nebulization every 4 (four) hours as needed for wheezing or shortness of breath.   albuterol  (VENTOLIN  HFA) 108 (90 Base) MCG/ACT inhaler Inhale 1-2 puffs into the lungs every 4 (four) hours as needed for wheezing or shortness of breath.   atorvastatin  (LIPITOR) 40 MG tablet TAKE 1 TABLET(40 MG) BY MOUTH DAILY AT 6 PM   azithromycin  (ZITHROMAX ) 250 MG tablet Take as directed   budesonide  (PULMICORT ) 0.5 MG/2ML nebulizer solution Take 2 mLs (0.5 mg total) by nebulization 2 (two) times daily.   buPROPion  (WELLBUTRIN  XL) 300 MG 24 hr tablet Take 300 mg by mouth every morning.   citalopram  (CELEXA ) 40 MG tablet Take 40 mg by mouth every morning.   clotrimazole -betamethasone  (LOTRISONE ) cream Apply 1 Application topically 2 (two) times daily. Apply to buttocks and left ankle & heel.   cyclobenzaprine  (FLEXERIL ) 5 MG tablet Take 1 tablet (5 mg total) by mouth 3 (three) times daily as needed for muscle spasms.   diphenoxylate -atropine  (LOMOTIL ) 2.5-0.025 MG tablet Take 1-2 tablets by mouth 4 (four) times daily as needed for diarrhea or loose stools (Max dose of 8 tabs in 24 hours).   Dupilumab  (DUPIXENT ) 300 MG/2ML SOAJ Inject 300 mg into the skin every 14 (fourteen) days.   famotidine  (PEPCID ) 20 MG  tablet One after supper   Fluticasone -Umeclidin-Vilant (TRELEGY ELLIPTA ) 200-62.5-25 MCG/ACT AEPB  Inhale 1 puff into the lungs daily.   glipiZIDE  (GLUCOTROL  XL) 2.5 MG 24 hr tablet TAKE 1 TABLET(2.5 MG) BY MOUTH TWICE DAILY AFTER A MEAL   ipratropium-albuterol  (DUONEB) 0.5-2.5 (3) MG/3ML SOLN Take 3 mLs by nebulization 4 (four) times daily.   metoprolol  succinate (TOPROL -XL) 25 MG 24 hr tablet TAKE 1/2 TABLET(12.5 MG) BY MOUTH DAILY   olmesartan  (BENICAR ) 40 MG tablet Take 1 tablet (40 mg total) by mouth daily.   ondansetron  (ZOFRAN ) 4 MG tablet Take 1 tablet (4 mg total) by mouth every 8 (eight) hours as needed for nausea or vomiting.   pantoprazole  (PROTONIX ) 40 MG tablet One tablet by mouth daily 30 to 60 minutes before the first meal of the day   predniSONE  (DELTASONE ) 10 MG tablet Take 6 tablets (60 mg total) by mouth daily with breakfast. Take 60 mg/day on day 1 and reduce dose by 10 mg every 2 days until taper is complete   triamcinolone  ointment (KENALOG ) 0.5 % Apply topically 2 (two) times daily.   [DISCONTINUED] predniSONE  (DELTASONE ) 10 MG tablet Take 4 tabs (40 mg total) daily x 3 days, then 3 tabs (30 mg total) daily x 3 days, then 2 tabs (20 mg total) daily x 3 days, then 1 tab (10 mg total) daily x 3 days, then stop.   No facility-administered encounter medications on file as of 06/15/2024.     Physical Exam: Today's Vitals   06/15/24 1407  BP: (!) 140/80  Pulse: (!) 103  Temp: 98.8 F (37.1 C)  TempSrc: Oral  SpO2: 96%  Weight: 173 lb (78.5 kg)  Height: 5' 9 (1.753 m)   Body mass index is 25.55 kg/m.  Physical Exam GEN: No acute distress. CV: Regular rate and rhythm, no murmurs. LUNGS: Bilateral expiratory wheeze SKIN JOINTS: Warm and dry, no rash.    Data Reviewed: Imaging: CTA 06/12/2024-no pulm embolism, emphysema with no infiltrate or nodule I have reviewed the images personally.  PFTs: 09/11/2022 FVC 3.22 [96%], FEV1 1.83 [60%], F/F57, TLC 6.78 [96%], DLCO 16.42 [65%] Moderate obstruction with bronchodilator response, air trapping Mild diffusion  defect  Labs: Assessment & Plan Asthma-Chronic obstructive pulmonary disease with exacerbation Severe COPD exacerbation requiring hospitalization. Recent CT angiogram showed no pneumonia or blood clots, only emphysema. Currently on Trelegy, nebulizer treatments, and Dupixent . Recent prednisone  prescription not filled before hospitalization. Currently on 40 mg prednisone  but still experiencing significant wheezing and weakness. - Increase prednisone  to 60 mg and taper as prescribed - Administer Depo steroid injection in office today - Initiate Ohtuvayre  nebulizer treatment twice daily - Refer to pulmonary rehabilitation - Continue Trelegy, budesonide  nebulizer, and Dupixent  at home - Emphasize the importance of exercise in managing COPD and preventing further exacerbations  Follow-up with Dr. Dewald   Faryn Sieg MD [facility] 06/15/2024, 2:26 PM  CC: Lucius Krabbe, NP

## 2024-06-15 NOTE — Patient Instructions (Signed)
  VISIT SUMMARY: You were seen today for a follow-up after a recent severe COPD exacerbation that required hospitalization. We discussed your current symptoms, treatment regimen, and additional steps to manage your condition.  YOUR PLAN: -CHRONIC OBSTRUCTIVE PULMONARY DISEASE (COPD) WITH RECENT EXACERBATION: COPD is a chronic lung disease that makes it hard to breathe. You recently had a severe flare-up that required hospitalization. To help manage your symptoms, we are increasing your prednisone  dose to 60 mg and will taper it as prescribed. You will also receive a steroid injection and start using the Ohtuvayre  nebulizer treatment twice daily. Continue taking Trelegy, budesonide  nebulizer, and Dupixent  as directed. We are referring you to pulmonary rehabilitation to help improve your lung function and overall health. Regular exercise is important to manage COPD and prevent future flare-ups.  INSTRUCTIONS: Please follow the prednisone  tapering schedule as prescribed. Start using the Ohtuvayre  nebulizer treatment twice daily. Continue with your current medications: Trelegy, budesonide  nebulizer, and Dupixent . Attend pulmonary rehabilitation sessions as scheduled. Make sure to stay active with regular exercise to help manage your COPD. Follow up with your primary care physician or pulmonologist as advised.

## 2024-06-16 ENCOUNTER — Telehealth: Payer: Self-pay

## 2024-06-16 ENCOUNTER — Telehealth (HOSPITAL_COMMUNITY): Payer: Self-pay

## 2024-06-16 ENCOUNTER — Encounter (HOSPITAL_COMMUNITY): Payer: Self-pay

## 2024-06-16 ENCOUNTER — Encounter: Payer: Self-pay | Admitting: Family

## 2024-06-16 ENCOUNTER — Ambulatory Visit: Admitting: Family

## 2024-06-16 VITALS — BP 145/87 | HR 98 | Temp 97.2°F | Ht 69.0 in | Wt 185.2 lb

## 2024-06-16 DIAGNOSIS — J449 Chronic obstructive pulmonary disease, unspecified: Secondary | ICD-10-CM | POA: Diagnosis not present

## 2024-06-16 DIAGNOSIS — E099 Drug or chemical induced diabetes mellitus without complications: Secondary | ICD-10-CM

## 2024-06-16 DIAGNOSIS — D72828 Other elevated white blood cell count: Secondary | ICD-10-CM

## 2024-06-16 DIAGNOSIS — J9601 Acute respiratory failure with hypoxia: Secondary | ICD-10-CM

## 2024-06-16 DIAGNOSIS — I10 Essential (primary) hypertension: Secondary | ICD-10-CM

## 2024-06-16 DIAGNOSIS — N1832 Chronic kidney disease, stage 3b: Secondary | ICD-10-CM

## 2024-06-16 DIAGNOSIS — Z7984 Long term (current) use of oral hypoglycemic drugs: Secondary | ICD-10-CM

## 2024-06-16 DIAGNOSIS — D692 Other nonthrombocytopenic purpura: Secondary | ICD-10-CM

## 2024-06-16 DIAGNOSIS — E441 Mild protein-calorie malnutrition: Secondary | ICD-10-CM

## 2024-06-16 LAB — CBC WITH DIFFERENTIAL/PLATELET
Basophils Absolute: 0 K/uL (ref 0.0–0.1)
Basophils Relative: 0.1 % (ref 0.0–3.0)
Eosinophils Absolute: 0 K/uL (ref 0.0–0.7)
Eosinophils Relative: 0 % (ref 0.0–5.0)
HCT: 40.3 % (ref 39.0–52.0)
Hemoglobin: 13.3 g/dL (ref 13.0–17.0)
Lymphocytes Relative: 4.9 % — ABNORMAL LOW (ref 12.0–46.0)
Lymphs Abs: 0.5 K/uL — ABNORMAL LOW (ref 0.7–4.0)
MCHC: 32.9 g/dL (ref 30.0–36.0)
MCV: 95.7 fl (ref 78.0–100.0)
Monocytes Absolute: 0.2 K/uL (ref 0.1–1.0)
Monocytes Relative: 1.7 % — ABNORMAL LOW (ref 3.0–12.0)
Neutro Abs: 9.6 K/uL — ABNORMAL HIGH (ref 1.4–7.7)
Neutrophils Relative %: 93.3 % — ABNORMAL HIGH (ref 43.0–77.0)
Platelets: 273 K/uL (ref 150.0–400.0)
RBC: 4.22 Mil/uL (ref 4.22–5.81)
RDW: 14 % (ref 11.5–15.5)
WBC: 10.2 K/uL (ref 4.0–10.5)

## 2024-06-16 LAB — BASIC METABOLIC PANEL WITH GFR
BUN: 28 mg/dL — ABNORMAL HIGH (ref 6–23)
CO2: 26 meq/L (ref 19–32)
Calcium: 8.5 mg/dL (ref 8.4–10.5)
Chloride: 102 meq/L (ref 96–112)
Creatinine, Ser: 1.1 mg/dL (ref 0.40–1.50)
GFR: 64.6 mL/min (ref >60.00)
Glucose, Bld: 180 mg/dL — ABNORMAL HIGH (ref 70–99)
Potassium: 4.6 meq/L (ref 3.5–5.1)
Sodium: 137 meq/L (ref 135–145)

## 2024-06-16 LAB — MICROALBUMIN / CREATININE URINE RATIO
Creatinine,U: 81.8 mg/dL
Microalb Creat Ratio: 23.8 mg/g (ref 0.0–30.0)
Microalb, Ur: 1.9 mg/dL (ref 0.0–1.9)

## 2024-06-16 NOTE — Transitions of Care (Post Inpatient/ED Visit) (Unsigned)
   06/16/2024  Name: Gary Wright MRN: [id number] DOB: 1946-08-19  Today's TOC FU Call Status: Today's TOC FU Call Status:: Unsuccessful Call (2nd Attempt) Unsuccessful Call (1st Attempt) Date: 06/15/24 Unsuccessful Call (2nd Attempt) Date: 06/16/24  Attempted to reach the patient regarding the most recent Inpatient/ED visit.  Follow Up Plan: Additional outreach attempts will be made to reach the patient to complete the Transitions of Care (Post Inpatient/ED visit) call.   Signature Julian Lemmings, LPN [facility] Nurse Health Advisor Direct Dial [phone]

## 2024-06-16 NOTE — Patient Instructions (Addendum)
 It was very nice to see you today!  I will review your lab results via MyChart or phone call in a few days.  For your blood sugar, Increase your Glipizide  to 2 pills in the morning and 1 pill in the evening while you are on the steroid pills (Prednisone ).  Then go back to 1 pill twice a day.  See the diabetic food handout attached.  Follow up with me next month as planned.     PLEASE NOTE:  If you had any lab tests please let us  know if you have not heard back within a few days. You may see your results on MyChart before we have a chance to review them but we will give you a call once they are reviewed by us . If we ordered any referrals today, please let us  know if you have not heard from their office within the next week.

## 2024-06-16 NOTE — Telephone Encounter (Signed)
 Pt insurance is active and benefits verified through [facility] Medicare Co-pay $15, DED 0/0 met, out of pocket $5,500/$1,628.09 met, co-insurance 0%. no pre-authorization required, John/[employer] 06/16/2024@3 :54, REF# [id number]

## 2024-06-16 NOTE — Telephone Encounter (Signed)
 Pt returned phone call and is interested in the pulmonary rehab program. Pt will come in for orientation on 9/22@1030  and will attend the 1:15 exercise class time.   Sent packet

## 2024-06-16 NOTE — Telephone Encounter (Signed)
 Received Ohtuvayre  new start paperwork. Completed form and faxed with clinicals and insurance card copy to [facility]   Phone#: [phone] Fax#: [phone]

## 2024-06-16 NOTE — Telephone Encounter (Signed)
Attempted to call patient in regards to Pulmonary Rehab - LM on VM   Sent letter 

## 2024-06-16 NOTE — Progress Notes (Unsigned)
 Patient ID: Gary Wright, male    DOB: [birth year], 78 y.o.   MRN: [id number]  Chief Complaint  Patient presents with   Rash    Pt c/o rash on right arm.    Diabetes    Pt was seen in ED on 8/25, CBG was elevated.   Discussed the use of AI scribe software for clinical note transcription with the patient, who gave verbal consent to proceed.  History of Present Illness   Gary Wright is a 78 year old male with COPD who presents for follow-up after a recent hospitalization for COPD exacerbation.  Dyspnea and chronic obstructive pulmonary disease (copd) exacerbation - Severe COPD exacerbation with significant dyspnea and unresponsiveness requiring emergency medical intervention and hospitalization 8/23-8/25. - Stage four COPD - Uses nebulizer four times daily, Trelegy, and Dupixent  biweekly - Does not use home oxygen - Monitors oxygen saturation with an oximeter - No current use of azithromycin , but is on d/c summary  Hyperglycemia and diabetes mellitus - Hyperglycemia during hospitalization with blood glucose levels in the 300s, likely related to stress and steroid use - Hemoglobin A1c improved to 6.1 from previous levels in the 7s - Takes glipizide  twice daily for diabetes management - No home glucometer use - Plans to taper off prednisone  within a week - Seeks dietary guidance for diabetes management with active involvement from his wife - Maintains hydration with water and lemon juice  Cutaneous purpura - Purpura present on arms - Lesions are asymptomatic - No association with anticoagulant use  Hypertension - Hypertension managed with metoprolol  and olmesartan  - Previously switched from lisinopril  due to cough     Assessment & Plan:     Chronic obstructive pulmonary disease, stage 4 Stage 4 COPD with recent severe exacerbation requiring hospitalization. Discussed progressive nature and importance of avoiding hospitalizations. Explained eventual decline in lung  function despite medication. - Continue nebulizer treatments four times a day. - Continue Trelegy and Dupixent  as prescribed. - Increase prednisone  dose temporarily as per pulmonologist's recommendation. - Recheck blood work including CBC and BMP. - Discussed the ineffectiveness of canned oxygen for his condition. - Ensure availability of rescue inhaler.  Type 2 diabetes mellitus Type 2 diabetes with recent hospitalization showing elevated blood glucose levels, likely due to stress and steroid use. A1c improved to 6.1 with glipizide . Discussed impact of steroids on blood sugar and need for temporary glipizide  adjustment. - Increase glipizide  to two tablets in the morning and one at night while on steroids. - Provide dietary guidance for diabetes management. - Consider glucometer for home use if A1c remains elevated.  Hypertension Hypertension with recent increase in blood pressure readings. Managed with metoprolol  and olmesartan . - Continue metoprolol  and olmesartan  as prescribed.  Senile purpura Intermittent skin discoloration likely senile purpura, possibly exacerbated by steroid use. No pain or itching. - Reassure regarding benign nature of condition.  Protein-calorie malnutrition, mild Mild protein-calorie malnutrition with low protein levels. Discussed importance of adequate protein intake and dietary management. - Provide dietary guidance to increase protein intake. - Encourage small, frequent meals to maintain blood sugar levels.  Follow-Up Follow-up appointment scheduled for next month to monitor ongoing conditions and treatment efficacy. - Maintain scheduled follow-up appointment next month.       Subjective:    Outpatient Medications Prior to Visit  Medication Sig Dispense Refill   albuterol  (PROVENTIL ) (2.5 MG/3ML) 0.083% nebulizer solution Take 3 mLs (2.5 mg total) by nebulization every 4 (four) hours as needed for wheezing or shortness of  breath. 360 mL 5    albuterol  (VENTOLIN  HFA) 108 (90 Base) MCG/ACT inhaler Inhale 1-2 puffs into the lungs every 4 (four) hours as needed for wheezing or shortness of breath. 20.1 g 3   atorvastatin  (LIPITOR) 40 MG tablet TAKE 1 TABLET(40 MG) BY MOUTH DAILY AT 6 PM 90 tablet 3   azithromycin  (ZITHROMAX ) 250 MG tablet Take as directed 6 tablet 0   budesonide  (PULMICORT ) 0.5 MG/2ML nebulizer solution Take 2 mLs (0.5 mg total) by nebulization 2 (two) times daily. 120 mL 11   buPROPion  (WELLBUTRIN  XL) 300 MG 24 hr tablet Take 300 mg by mouth every morning.  1   citalopram  (CELEXA ) 40 MG tablet Take 40 mg by mouth every morning.  0   clotrimazole -betamethasone  (LOTRISONE ) cream Apply 1 Application topically 2 (two) times daily. Apply to buttocks and left ankle & heel. 45 g 2   Dupilumab  (DUPIXENT ) 300 MG/2ML SOAJ Inject 300 mg into the skin every 14 (fourteen) days. 12 mL 0   famotidine  (PEPCID ) 20 MG tablet One after supper 30 tablet 11   Fluticasone -Umeclidin-Vilant (TRELEGY ELLIPTA ) 200-62.5-25 MCG/ACT AEPB Inhale 1 puff into the lungs daily.     glipiZIDE  (GLUCOTROL  XL) 2.5 MG 24 hr tablet TAKE 1 TABLET(2.5 MG) BY MOUTH TWICE DAILY AFTER A MEAL 60 tablet 2   ipratropium-albuterol  (DUONEB) 0.5-2.5 (3) MG/3ML SOLN Take 3 mLs by nebulization 4 (four) times daily. 360 mL 11   metoprolol  succinate (TOPROL -XL) 25 MG 24 hr tablet TAKE 1/2 TABLET(12.5 MG) BY MOUTH DAILY 45 tablet 0   olmesartan  (BENICAR ) 40 MG tablet Take 1 tablet (40 mg total) by mouth daily. 30 tablet 11   pantoprazole  (PROTONIX ) 40 MG tablet One tablet by mouth daily 30 to 60 minutes before the first meal of the day 90 tablet 1   predniSONE  (DELTASONE ) 10 MG tablet Take 6 tablets (60 mg total) by mouth daily with breakfast. Take 60 mg/day on day 1 and reduce dose by 10 mg every 2 days until taper is complete 50 tablet 0   triamcinolone  ointment (KENALOG ) 0.5 % Apply topically 2 (two) times daily. 30 g 0   cyclobenzaprine  (FLEXERIL ) 5 MG tablet Take 1 tablet  (5 mg total) by mouth 3 (three) times daily as needed for muscle spasms. 30 tablet 5   diphenoxylate -atropine  (LOMOTIL ) 2.5-0.025 MG tablet Take 1-2 tablets by mouth 4 (four) times daily as needed for diarrhea or loose stools (Max dose of 8 tabs in 24 hours). 30 tablet 0   ondansetron  (ZOFRAN ) 4 MG tablet Take 1 tablet (4 mg total) by mouth every 8 (eight) hours as needed for nausea or vomiting. 20 tablet 0   No facility-administered medications prior to visit.   Past Medical History:  Diagnosis Date   Acute dyspnea 03/24/2022   Acute exacerbation of chronic obstructive pulmonary disease (COPD) (HCC) 03/25/2022   Asthma    CAD (coronary artery disease) of artery bypass graft 06/13/2016   Chest pain    COPD (chronic obstructive pulmonary disease) (HCC)    COPD with acute exacerbation (HCC) 11/02/2020   Quit smokig 2019  - PFT's  121/22/23  FEV1 1.83 (60 % ) ratio 0.57  p 15 % improvement from saba p 0 prior to study with DLCO  16.4 (65%)   and FV curve mildly concave  - try off acei and on otc gerd rx 06/02/2023 >>>       Depression 09/01/2007   Qualifier: Diagnosis of  By: Nickola CMA, [place]  Elevated troponin 06/13/2016   History of lung cancer 11/02/2020   Hyperlipidemia 11/02/2020   Hypertension    Prolonged QT interval 11/02/2020   Stress-induced cardiomyopathy 06/13/2016   TOBACCO ABUSE 09/01/2007   Qualifier: Diagnosis of   By: Nickola CMA, [place]         Past Surgical History:  Procedure Laterality Date   BACK SURGERY     fusion in 1973   CARDIAC CATHETERIZATION N/A 06/13/2016   Procedure: Left Heart Cath and Coronary Angiography;  Surgeon: Lonni Hanson, MD;  Location[facility] INVASIVE CV LAB;  Service: Cardiovascular;  Laterality: N/A;   INGUINAL HERNIA REPAIR Right 04/21/2018   Procedure: RIGHT INGUINAL HERNIA REPAIR [facility];  Surgeon: Vanderbilt Ned, MD;  Location: [facility] OR;  Service: General;  Laterality: Right;  TAP BLOCK   INSERTION OF MESH Right 04/21/2018    Procedure: INSERTION OF MESH;  Surgeon: Vanderbilt Ned, MD;  Location: [facility] OR;  Service: General;  Laterality: Right;   LUNG REMOVAL, PARTIAL Left    left upper lung removed about 6-7 years ago reported by pt   TONSILLECTOMY     No Known Allergies    Objective:    Physical Exam Vitals and nursing note reviewed.  Constitutional:      General: He is not in acute distress.    Appearance: Normal appearance.  HENT:     Head: Normocephalic.  Cardiovascular:     Rate and Rhythm: Normal rate and regular rhythm.  Pulmonary:     Effort: Pulmonary effort is normal.     Breath sounds: Normal breath sounds.  Musculoskeletal:        General: Normal range of motion.     Cervical back: Normal range of motion.  Skin:    General: Skin is warm and dry.  Neurological:     Mental Status: He is alert and oriented to person, place, and time.  Psychiatric:        Mood and Affect: Mood normal.    BP (!) 145/87 (BP Location: Left Arm, Patient Position: Sitting, Cuff Size: Large)   Pulse 98   Temp (!) 97.2 F (36.2 C) (Temporal)   Ht 5' 9 (1.753 m)   Wt 185 lb 3.2 oz (84 kg)   SpO2 99%   BMI 27.35 kg/m  Wt Readings from Last 3 Encounters:  06/16/24 185 lb 3.2 oz (84 kg)  06/15/24 173 lb (78.5 kg)  06/12/24 186 lb (84.4 kg)      Lucius Krabbe, NP

## 2024-06-17 NOTE — Assessment & Plan Note (Signed)
 Hypertension with recent increase in blood pressure readings. Managed with metoprolol  and olmesartan . - Continue metoprolol  and olmesartan  as prescribed, no refills needed today - Continue to follow with Cardiology - F/U in 1 month

## 2024-06-17 NOTE — Telephone Encounter (Signed)
 Received fax from [employer] with summary of benefits. Referral form for Ohtuvayre  received. Rx will be triaged to [facility].. Once benefits investigation completed, pharmacy will reach out the patient to schedule shipment. If medication is unaffordable, patient will need to express financial hardship to be referred back to [facility] for patient assistance program pre-screening.   Patient ID: [id number] Pharmacy phone: [phone] [place] Phone#: [phone]

## 2024-06-17 NOTE — Assessment & Plan Note (Signed)
 Type 2 diabetes with recent hospitalization showing elevated blood glucose levels, likely due to stress and steroid use. A1c improved to 6.1 with glipizide . Discussed impact of steroids on blood sugar and need for temporary glipizide  adjustment. - Increase glipizide  to two tablets in the morning and one at night while on steroids. - Provided dietary guidance for diabetes management including handouts - Consider glucometer for home use if A1c remains elevated. - F/U 1 month

## 2024-06-17 NOTE — Transitions of Care (Post Inpatient/ED Visit) (Signed)
   06/17/2024  Name: Gary Wright MRN: [id number] DOB: 21-Aug-1946  Today's TOC FU Call Status: Today's TOC FU Call Status:: Unsuccessful Call (3rd Attempt) Unsuccessful Call (1st Attempt) Date: 06/15/24 Unsuccessful Call (2nd Attempt) Date: 06/16/24 Unsuccessful Call (3rd Attempt) Date: 06/17/24  Attempted to reach the patient regarding the most recent Inpatient/ED visit.  Follow Up Plan: No further outreach attempts will be made at this time. We have been unable to contact the patient.  Signature left Julian Lemmings, LPN [facility] Nurse Health Advisor Direct Dial [phone]

## 2024-06-18 ENCOUNTER — Encounter: Payer: Self-pay | Admitting: Pulmonary Disease

## 2024-06-18 ENCOUNTER — Ambulatory Visit: Payer: Self-pay | Admitting: Family

## 2024-06-18 LAB — CULTURE, BLOOD (ROUTINE X 2)
Culture: NO GROWTH
Culture: NO GROWTH
Special Requests: ADEQUATE
Special Requests: ADEQUATE

## 2024-06-18 NOTE — Progress Notes (Signed)
 Labs look good, no signs of infection, glucose high but remind him to be taking 2 of his Glipizide  pills in the morning while on the steroid (prednisone ). Thx

## 2024-06-22 ENCOUNTER — Ambulatory Visit: Admitting: Family

## 2024-06-22 ENCOUNTER — Other Ambulatory Visit: Payer: Self-pay

## 2024-06-22 ENCOUNTER — Other Ambulatory Visit: Payer: Self-pay | Admitting: Family

## 2024-06-22 DIAGNOSIS — R21 Rash and other nonspecific skin eruption: Secondary | ICD-10-CM

## 2024-06-23 ENCOUNTER — Other Ambulatory Visit: Payer: Self-pay | Admitting: Family

## 2024-06-23 ENCOUNTER — Other Ambulatory Visit (HOSPITAL_COMMUNITY): Payer: Self-pay

## 2024-06-23 DIAGNOSIS — R21 Rash and other nonspecific skin eruption: Secondary | ICD-10-CM

## 2024-06-24 ENCOUNTER — Telehealth: Payer: Self-pay

## 2024-06-24 ENCOUNTER — Other Ambulatory Visit: Payer: Self-pay

## 2024-06-24 ENCOUNTER — Other Ambulatory Visit: Payer: Self-pay | Admitting: Pharmacy Technician

## 2024-06-24 DIAGNOSIS — R21 Rash and other nonspecific skin eruption: Secondary | ICD-10-CM

## 2024-06-24 MED ORDER — TRIAMCINOLONE ACETONIDE 0.5 % EX OINT: TOPICAL_OINTMENT | Freq: Two times a day (BID) | CUTANEOUS | 11 refills | Status: AC

## 2024-06-24 NOTE — Progress Notes (Signed)
 Specialty Pharmacy Refill Coordination Note  Gary Wright is a 78 y.o. male contacted today regarding refills of specialty medication(s) Dupilumab  (Dupixent )   Patient requested Delivery   Delivery date: 06/29/24   Verified address: [address]   [place] [address]   Medication will be filled on 06/28/24. Injection date on 9/12 & 9/26.

## 2024-06-24 NOTE — Telephone Encounter (Signed)
 RX sent.   Copied from CRM [id number]. Topic: Clinical - Prescription Issue >> Jun 24, 2024  9:41 AM Gary Wright wrote: Reason for CRM: Patient called said the pharmacy said that his medication: triamcinolone  ointment (KENALOG ) 0.5 % [[id number]] was denied.

## 2024-06-27 ENCOUNTER — Other Ambulatory Visit: Payer: Self-pay | Admitting: Internal Medicine

## 2024-06-29 ENCOUNTER — Other Ambulatory Visit: Payer: Self-pay | Admitting: Internal Medicine

## 2024-06-29 MED ORDER — OLMESARTAN MEDOXOMIL 40 MG PO TABS: 40.0000 mg | ORAL_TABLET | Freq: Every day | ORAL | 11 refills | Status: DC

## 2024-06-29 NOTE — Telephone Encounter (Signed)
 Copied from CRM #[id number]. Topic: Clinical - Medication Refill >> Jun 29, 2024 10:01 AM Essie A wrote: Medication: olmesartan  (BENICAR ) 40 MG tablet  Has the patient contacted their pharmacy? Yes (Agent: If no, request that the patient contact the pharmacy for the refill. If patient does not wish to contact the pharmacy document the reason why and proceed with request.) (Agent: If yes, when and what did the pharmacy advise?)  This is the patient's preferred pharmacy:  [id number] [place], [place] - [address] AT [facility] [address] [facility] [address] [place] [address] Phone: [phone] Fax: [phone]  Is this the correct pharmacy for this prescription? Yes If no, delete pharmacy and type the correct one.   Has the prescription been filled recently? Yes  Is the patient out of the medication? No, has 6-7 days left  Has the patient been seen for an appointment in the last year OR does the patient have an upcoming appointment? Yes  Can we respond through MyChart? Yes  Agent: Please be advised that Rx refills may take up to 3 business days. We ask that you follow-up with your pharmacy.

## 2024-06-30 ENCOUNTER — Ambulatory Visit: Payer: Self-pay

## 2024-06-30 NOTE — Telephone Encounter (Signed)
 Acute ov scheduled for 9/15 with Candis Dandy & pt is aware. Nfn

## 2024-06-30 NOTE — Telephone Encounter (Signed)
 FYI Only or Action Required?: Action required by provider: clinical question for provider.  Patient was last seen in primary care on 06/16/2024 by Lucius Krabbe, NP.  Called Nurse Triage reporting Wheezing.  Symptoms began today.  Interventions attempted: Prescription medications: asking for more Prednisone .  Symptoms are: unchanged. States he is having wheezing and SOB. 02 sats %97. Asking for Prednisone . No available OV.  Triage Disposition: See PCP When Office is Open (Within 3 Days)  Patient/caregiver understands and will follow disposition?: Yes   Copied from CRM [id number]. Topic: Clinical - Red Word Triage >> Jun 30, 2024  2:40 PM Isabell A wrote: [facility] that prompted transfer to Nurse Triage: Patients states its harder to breathe then normal, wheezing Reason for Disposition  [1] MODERATE longstanding difficulty breathing (e.g., speaks in phrases, SOB even at rest, pulse 100-120) AND [2] SAME as normal  Answer Assessment - Initial Assessment Questions 1. RESPIRATORY STATUS: Describe your breathing? (e.g., wheezing, shortness of breath, unable to speak, severe coughing)      Wheezing, SOB 2. ONSET: When did this breathing problem begin?      1 week ago 3. PATTERN Does the difficult breathing come and go, or has it been constant since it started?      Constant 4. SEVERITY: How bad is your breathing? (e.g., mild, moderate, severe)      moderate 5. RECURRENT SYMPTOM: Have you had difficulty breathing before? If Yes, ask: When was the last time? and What happened that time?      yes 6. CARDIAC HISTORY: Do you have any history of heart disease? (e.g., heart attack, angina, bypass surgery, angioplasty)      no 7. LUNG HISTORY: Do you have any history of lung disease?  (e.g., pulmonary embolus, asthma, emphysema)     yes 8. CAUSE: What do you think is causing the breathing problem?      Flare up 9. OTHER SYMPTOMS: Do you have any other symptoms? (e.g.,  chest pain, cough, dizziness, fever, runny nose)     no 10. O2 SATURATION MONITOR:  Do you use an oxygen saturation monitor (pulse oximeter) at home? If Yes, ask: What is your reading (oxygen level) today? What is your usual oxygen saturation reading? (e.g., 95%)       97% 11. PREGNANCY: Is there any chance you are pregnant? When was your last menstrual period?       N/a 12. TRAVEL: Have you traveled out of the country in the last month? (e.g., travel history, exposures)       no  Protocols used: Breathing Difficulty-A-AH

## 2024-07-01 ENCOUNTER — Other Ambulatory Visit: Payer: Self-pay

## 2024-07-01 ENCOUNTER — Emergency Department (HOSPITAL_BASED_OUTPATIENT_CLINIC_OR_DEPARTMENT_OTHER): Admission: EM | Admit: 2024-07-01 | Discharge: 2024-07-01 | Disposition: A | Discharge status: Home / Self Care

## 2024-07-01 ENCOUNTER — Emergency Department (HOSPITAL_BASED_OUTPATIENT_CLINIC_OR_DEPARTMENT_OTHER): Admitting: Radiology

## 2024-07-01 ENCOUNTER — Encounter (HOSPITAL_BASED_OUTPATIENT_CLINIC_OR_DEPARTMENT_OTHER): Payer: Self-pay | Admitting: Emergency Medicine

## 2024-07-01 DIAGNOSIS — D72829 Elevated white blood cell count, unspecified: Secondary | ICD-10-CM | POA: Insufficient documentation

## 2024-07-01 DIAGNOSIS — J449 Chronic obstructive pulmonary disease, unspecified: Secondary | ICD-10-CM | POA: Insufficient documentation

## 2024-07-01 DIAGNOSIS — R0602 Shortness of breath: Secondary | ICD-10-CM | POA: Diagnosis present

## 2024-07-01 DIAGNOSIS — Z7951 Long term (current) use of inhaled steroids: Secondary | ICD-10-CM | POA: Insufficient documentation

## 2024-07-01 DIAGNOSIS — Z7952 Long term (current) use of systemic steroids: Secondary | ICD-10-CM | POA: Insufficient documentation

## 2024-07-01 LAB — CBC
HCT: 46.9 % (ref 39.0–52.0)
Hemoglobin: 15.4 g/dL (ref 13.0–17.0)
MCH: 32.3 pg (ref 26.0–34.0)
MCHC: 32.8 g/dL (ref 30.0–36.0)
MCV: 98.3 fL (ref 80.0–100.0)
Platelets: 287 K/uL (ref 150–400)
RBC: 4.77 MIL/uL (ref 4.22–5.81)
RDW: 13.9 % (ref 11.5–15.5)
WBC: 14.9 K/uL — ABNORMAL HIGH (ref 4.0–10.5)
nRBC: 0 % (ref 0.0–0.2)

## 2024-07-01 LAB — BASIC METABOLIC PANEL WITH GFR
Anion gap: 14 (ref 5–15)
BUN: 28 mg/dL — ABNORMAL HIGH (ref 8–23)
CO2: 25 mmol/L (ref 22–32)
Calcium: 9.8 mg/dL (ref 8.9–10.3)
Chloride: 100 mmol/L (ref 98–111)
Creatinine, Ser: 1.53 mg/dL — ABNORMAL HIGH (ref 0.61–1.24)
GFR, Estimated: 47 mL/min — ABNORMAL LOW (ref >60)
Glucose, Bld: 197 mg/dL — ABNORMAL HIGH (ref 70–99)
Potassium: 4.9 mmol/L (ref 3.5–5.1)
Sodium: 138 mmol/L (ref 135–145)

## 2024-07-01 MED ORDER — IPRATROPIUM BROMIDE 0.02 % IN SOLN
0.5000 mg | Freq: Once | RESPIRATORY_TRACT | Status: AC
  Administered 2024-07-01: 0.5 mg via RESPIRATORY_TRACT
  Filled 2024-07-01: qty 2.5

## 2024-07-01 MED ORDER — METHYLPREDNISOLONE SODIUM SUCC 125 MG IJ SOLR
125.0000 mg | Freq: Once | INTRAMUSCULAR | Status: AC
  Administered 2024-07-01: 125 mg via INTRAVENOUS
  Filled 2024-07-01: qty 2

## 2024-07-01 MED ORDER — ALBUTEROL SULFATE (2.5 MG/3ML) 0.083% IN NEBU
5.0000 mg | INHALATION_SOLUTION | Freq: Once | RESPIRATORY_TRACT | Status: AC
  Administered 2024-07-01: 5 mg via RESPIRATORY_TRACT
  Filled 2024-07-01: qty 6

## 2024-07-01 MED ORDER — PREDNISONE 10 MG (21) PO TBPK
ORAL_TABLET | Freq: Every day | ORAL | 0 refills | Status: DC
Start: 2024-07-01 — End: 2024-07-07

## 2024-07-01 NOTE — ED Notes (Signed)
 Pt ambulated without any difficulty.  SpO2 98% on RA

## 2024-07-01 NOTE — Discharge Instructions (Signed)
 Please follow-up with your primary doctor and your pulmonologist.  We are prescribing you steroids.  Please take your inhalers.  Return show fevers, chills, worsening shortness of breath, chest pain, lightheadedness, passout or any new or worsening symptoms that are concerning to you.

## 2024-07-01 NOTE — ED Provider Notes (Signed)
 [place] EMERGENCY DEPARTMENT AT [facility] Provider Note   CSN: [id number] Arrival date & time: 07/01/24  1033     Patient presents with: Shortness of Breath   Gary Wright is a 78 y.o. male.   This is a 78 year old male with COPD not on oxygen presenting emergency department for shortness of breath.  Reports worsening shortness of breath over the past 4 to 5 days but seem to acutely worsen 2 days ago.  Dyspneic at rest, worsening with exertion.  Recent hospitalization for COPD exacerbation.  Not having chest pain or lightheadedness   Shortness of Breath      Prior to Admission medications   Medication Sig Start Date End Date Taking? Authorizing Provider  predniSONE  (STERAPRED UNI-PAK 21 TAB) 10 MG (21) TBPK tablet Take by mouth daily. Take 6 tabs by mouth daily  for 2 days, then 5 tabs for 2 days, then 4 tabs for 2 days, then 3 tabs for 2 days, 2 tabs for 2 days, then 1 tab by mouth daily for 2 days 07/01/24  Yes Killian Schwer, Caron PARAS, DO  albuterol  (PROVENTIL ) (2.5 MG/3ML) 0.083% nebulizer solution Take 3 mLs (2.5 mg total) by nebulization every 4 (four) hours as needed for wheezing or shortness of breath. 06/09/24   [name] Dorn NOVAK, MD  albuterol  (VENTOLIN  HFA) 108 (90 Base) MCG/ACT inhaler Inhale 1-2 puffs into the lungs every 4 (four) hours as needed for wheezing or shortness of breath. 06/09/24   [name] Dorn NOVAK, MD  atorvastatin  (LIPITOR) 40 MG tablet TAKE 1 TABLET(40 MG) BY MOUTH DAILY AT 6 PM 08/18/23   Pietro Redell RAMAN, MD  azithromycin  (ZITHROMAX ) 250 MG tablet Take as directed 06/09/24   [name] Dorn NOVAK, MD  budesonide  (PULMICORT ) 0.5 MG/2ML nebulizer solution Take 2 mLs (0.5 mg total) by nebulization 2 (two) times daily. 01/13/24   [name] Dorn NOVAK, MD  buPROPion  (WELLBUTRIN  XL) 300 MG 24 hr tablet Take 300 mg by mouth every morning. 05/11/16   [provider]  citalopram  (CELEXA ) 40 MG tablet Take 40 mg by mouth every morning. 06/01/16   [provider]  clotrimazole -betamethasone  (LOTRISONE ) cream Apply 1 Application topically 2 (two) times daily. Apply to buttocks and left ankle & heel. 02/05/24   Lucius Krabbe, NP  cyclobenzaprine  (FLEXERIL ) 5 MG tablet Take 1 tablet (5 mg total) by mouth 3 (three) times daily as needed for muscle spasms. 03/23/24   Lucius Krabbe, NP  diphenoxylate -atropine  (LOMOTIL ) 2.5-0.025 MG tablet Take 1-2 tablets by mouth 4 (four) times daily as needed for diarrhea or loose stools (Max dose of 8 tabs in 24 hours). 04/14/24   Lucius Krabbe, NP  Dupilumab  (DUPIXENT ) 300 MG/2ML SOAJ Inject 300 mg into the skin every 14 (fourteen) days. 04/12/24   [name] Dorn NOVAK, MD  famotidine  (PEPCID ) 20 MG tablet One after supper 12/22/23   Darlean Ozell NOVAK, MD  Fluticasone -Umeclidin-Vilant (TRELEGY ELLIPTA ) 200-62.5-25 MCG/ACT AEPB Inhale 1 puff into the lungs daily.    [provider]  glipiZIDE  (GLUCOTROL  XL) 2.5 MG 24 hr tablet TAKE 1 TABLET(2.5 MG) BY MOUTH TWICE DAILY AFTER A MEAL 05/03/24   Hudnell, Krabbe, NP  ipratropium-albuterol  (DUONEB) 0.5-2.5 (3) MG/3ML SOLN Take 3 mLs by nebulization 4 (four) times daily. 12/22/23   Darlean Ozell NOVAK, MD  metoprolol  succinate (TOPROL -XL) 25 MG 24 hr tablet TAKE 1/2 TABLET(12.5 MG) BY MOUTH DAILY 04/21/24   Pietro Redell RAMAN, MD  olmesartan  (BENICAR ) 40 MG tablet Take 1 tablet (40 mg total) by mouth  daily. 06/29/24 06/29/25  Darlean Ozell NOVAK, MD  ondansetron  (ZOFRAN ) 4 MG tablet Take 1 tablet (4 mg total) by mouth every 8 (eight) hours as needed for nausea or vomiting. 04/14/24   Lucius Krabbe, NP  pantoprazole  (PROTONIX ) 40 MG tablet One tablet by mouth daily 30 to 60 minutes before the first meal of the day 05/26/24   Darlean Ozell NOVAK, MD  triamcinolone  ointment (KENALOG ) 0.5 % Apply topically 2 (two) times daily. 06/24/24   Lucius Krabbe, NP    Allergies: Patient has no known allergies.    Review of Systems  Respiratory:  Positive for shortness of breath.      Updated Vital Signs BP 133/83   Pulse (!) 105   Temp 98.2 F (36.8 C) (Oral)   Resp 11   SpO2 (S) 98%   Physical Exam Vitals and nursing note reviewed.  Constitutional:      General: He is not in acute distress.    Appearance: He is not toxic-appearing.  Cardiovascular:     Rate and Rhythm: Normal rate and regular rhythm.  Pulmonary:     Effort: Pulmonary effort is normal.     Breath sounds: Wheezing present.     Comments: Does not appear to be in overt respiratory distress, some minor accessory muscle use, but does have diffuse wheezing on exam maintaining oxygen saturation on room air to 100%. Musculoskeletal:     Cervical back: Normal range of motion and neck supple.     Right lower leg: No edema.     Left lower leg: No edema.  Skin:    General: Skin is warm and dry.     Capillary Refill: Capillary refill takes less than 2 seconds.  Neurological:     Mental Status: He is alert and oriented to person, place, and time.  Psychiatric:        Mood and Affect: Mood normal.        Behavior: Behavior normal.     (all labs ordered are listed, but only abnormal results are displayed) Labs Reviewed  BASIC METABOLIC PANEL WITH GFR - Abnormal; Notable for the following components:      Result Value   Glucose, Bld 197 (*)    BUN 28 (*)    Creatinine, Ser 1.53 (*)    GFR, Estimated 47 (*)    All other components within normal limits  CBC - Abnormal; Notable for the following components:   WBC 14.9 (*)    All other components within normal limits    EKG: None  Radiology: DG Chest 2 View Result Date: 07/01/2024 CLINICAL DATA:  Recent hospitalization for COPD, presenting with shortness of breath x2 days. EXAM: CHEST - 2 VIEW COMPARISON:  June 12, 2024 FINDINGS: The heart size and mediastinal contours are within normal limits. Both lungs are clear. The visualized skeletal structures are unremarkable. IMPRESSION: No active cardiopulmonary disease. Electronically Signed    By: Suzen Dials M.D.   On: 07/01/2024 11:04     Procedures   Medications Ordered in the ED  albuterol  (PROVENTIL ) (2.5 MG/3ML) 0.083% nebulizer solution 5 mg (5 mg Nebulization Given 07/01/24 1118)  ipratropium (ATROVENT ) nebulizer solution 0.5 mg (0.5 mg Nebulization Given 07/01/24 1118)  methylPREDNISolone  sodium succinate (SOLU-MEDROL ) 125 mg/2 mL injection 125 mg (125 mg Intravenous Given 07/01/24 1124)  albuterol  (PROVENTIL ) (2.5 MG/3ML) 0.083% nebulizer solution 5 mg (5 mg Nebulization Given 07/01/24 1308)  ipratropium (ATROVENT ) nebulizer solution 0.5 mg (0.5 mg Nebulization Given 07/01/24 1308)  Clinical Course as of 07/01/24 1426  Thu Jul 01, 2024  1041 Admitted 8/25 for COPD; per dc summary: 78 year old married male, independent, medical history significant for, former tobacco use, COPD, CAD s/p CABG, HTN, DM2, HLD, ongoing progressive dyspnea since early August, seen in pulmonologist office on 8/24 acute exacerbation of asthma/COPD, prednisone  taper was prescribed but patient did not fill the prescription, presented to the ED on 8/23 with worsening dyspnea.   Admitted for asthma/COPD exacerbation, clinically improved  [TY]  1107 DG Chest 2 View IMPRESSION: No active cardiopulmonary disease.   Electronically Signed   By: Suzen Dials M.D.   On: 07/01/2024 11:04   [TY]  1107 WBC(!): 14.9 Recent steriod use.  [TY]  1112 Basic metabolic panel(!) No significant metabolic derangements.  Hyperglycemia, but does not appear to be in DKA.  No anion gap.  Minor elevation in his BUN and creatinine compared to 2 weeks ago at hospital admission [TY]  1259 Patient reexamined still maintaining oxygen saturation on room air.  Reports some improvement of his subjective dyspnea.  Continues to have wheezing although somewhat improved.  Will repeat breathing treatment [TY]  1418 Patient continues to improve after second round of breathing treatment.  Will ambulate with pulse ox.  [TY]  1425 Patient ambulated without desaturation.  Will DC in stable condition.  Will give steroids and have patient follow-up with primary doctor/pulmonologist.  Return precautions given.  Stable for discharge. [TY]    Clinical Course User Index [TY] Neysa Caron PARAS, DO                                 Medical Decision Making This is a 78 year old male presenting emergency department for shortness of breath.  Recent hospitalization for COPD exacerbation.  He is afebrile normotensive, mildly elevated heart rate maintaining oxygen saturation on room air.  Does not appear to be in overt respiratory distress as he is speaking in full sentences, some minor accessory muscle use and does have wheezing on exam.  Will get basic screening labs, chest x-ray, will give steroids and breathing treatments and reevaluate.    Amount and/or Complexity of Data Reviewed External Data Reviewed:     Details: See ED course Labs: ordered. Decision-making details documented in ED Course. Radiology: ordered and independent interpretation performed. Decision-making details documented in ED Course. ECG/medicine tests: independent interpretation performed. Decision-making details documented in ED Course.  Risk Prescription drug management. Decision regarding hospitalization. Diagnosis or treatment significantly limited by social determinants of health. Risk Details: Poor health literacy       Final diagnoses:  Chronic obstructive pulmonary disease, unspecified COPD type [facility])    ED Discharge Orders          Ordered    predniSONE  (STERAPRED UNI-PAK 21 TAB) 10 MG (21) TBPK tablet  Daily        07/01/24 1423               Neysa Caron PARAS, DO 07/01/24 1426

## 2024-07-01 NOTE — ED Triage Notes (Signed)
 C/o severe SHOB x 2 days. Recent hospitalization for COPD. States SHOB while at rest. Denies CP or fevers. Audible wheezes.

## 2024-07-05 ENCOUNTER — Encounter (HOSPITAL_BASED_OUTPATIENT_CLINIC_OR_DEPARTMENT_OTHER): Payer: Self-pay

## 2024-07-05 ENCOUNTER — Ambulatory Visit (HOSPITAL_BASED_OUTPATIENT_CLINIC_OR_DEPARTMENT_OTHER)

## 2024-07-05 VITALS — BP 129/76 | HR 103 | Ht 69.0 in | Wt 181.0 lb

## 2024-07-05 DIAGNOSIS — J449 Chronic obstructive pulmonary disease, unspecified: Secondary | ICD-10-CM | POA: Diagnosis not present

## 2024-07-05 NOTE — Patient Instructions (Signed)
 Continue steroid taper as currently prescribed.  Call clinic if worsening shortness of breath, or new/worsening symptoms.  Attend pulmonary rehab as recommended.  Start Ohtuvayre  as ordered once delivered  Continue Trelegy 200 daily.  Continue Duonebs four times a day.

## 2024-07-05 NOTE — Progress Notes (Signed)
 @Patient  ID: Gary Wright, male    DOB: 04-10-1946, 78 y.o.   MRN: [id number]  Chief Complaint  Patient presents with   Shortness of Breath    Acute visit     Referring provider: Lucius Krabbe, NP  HPI: Gary Wright is a 78 y/o male with PMH of COPD, asthma, lung CA, CAD s/p CABG, HTN, and cardiomyopathy who presents for c/o dyspnea.  He reports that he was treated for a COPD exacerbation on 06/15/2024 and subsequently went to the ER for acute respiratory failure.  He was treated with steroids and antibiotics, but returned to the ED for similar symptoms on 07/01/2024.  He received a dose of IM Solumedrol and was placed on a steroid taper which he is currently still taking.  He reports that he does not feel worse than he did before his ER visit; in fact, he reports that he feels better.  He reports compliance with Trelegy, Dupixent , budesonide  nebulizer, and Duonebs.  He has not yet attended pulmonary rehab.  He has this set up to start next week.  He denies chest pain, fever, chills, productive cough, appetite changes, worsening dyspnea, weight changes, lower extremity swelling, or other c/o.  TEST/EVENTS : CTA 06/12/2024:  no PE, infiltrate, or nodule Chest XR on 07/01/2024: no acute cardiopulmonary process  No Known Allergies  Immunization History  Administered Date(s) Administered   Fluad Quad(high Dose 65+) 08/14/2023   INFLUENZA, HIGH DOSE SEASONAL PF 07/21/2018   Influenza-Unspecified 07/10/2022   PFIZER(Purple Top)SARS-COV-2 Vaccination 12/31/2019, 01/24/2020   Pneumococcal Polysaccharide-23 06/14/2016   Pneumococcal-Unspecified 09/10/2022   Respiratory Syncytial Virus Vaccine,Recomb Aduvanted(Arexvy) 09/10/2022    Past Medical History:  Diagnosis Date   Acute dyspnea 03/24/2022   Acute exacerbation of chronic obstructive pulmonary disease (COPD) (HCC) 03/25/2022   Asthma    CAD (coronary artery disease) of artery bypass graft 06/13/2016   Chest pain    COPD  (chronic obstructive pulmonary disease) (HCC)    COPD with acute exacerbation (HCC) 11/02/2020   Quit smokig 2019  - PFT's  121/22/23  FEV1 1.83 (60 % ) ratio 0.57  p 15 % improvement from saba p 0 prior to study with DLCO  16.4 (65%)   and FV curve mildly concave  - try off acei and on otc gerd rx 06/02/2023 >>>       Depression 09/01/2007   Qualifier: Diagnosis of  By: Nickola CMA, [place]     Elevated troponin 06/13/2016   History of lung cancer 11/02/2020   Hyperlipidemia 11/02/2020   Hypertension    Prolonged QT interval 11/02/2020   Stress-induced cardiomyopathy 06/13/2016   TOBACCO ABUSE 09/01/2007   Qualifier: Diagnosis of   By: Nickola CMA, [place]          Tobacco History: Social History   Tobacco Use  Smoking Status Former   Current packs/day: 0.00   Types: Cigarettes   Quit date: 2019   Years since quitting: 6.7  Smokeless Tobacco Never   Counseling given: Not Answered   Outpatient Medications Prior to Visit  Medication Sig Dispense Refill   albuterol  (PROVENTIL ) (2.5 MG/3ML) 0.083% nebulizer solution Take 3 mLs (2.5 mg total) by nebulization every 4 (four) hours as needed for wheezing or shortness of breath. 360 mL 5   albuterol  (VENTOLIN  HFA) 108 (90 Base) MCG/ACT inhaler Inhale 1-2 puffs into the lungs every 4 (four) hours as needed for wheezing or shortness of breath. 20.1 g 3   atorvastatin  (LIPITOR) 40 MG tablet TAKE 1 TABLET(40  MG) BY MOUTH DAILY AT 6 PM 90 tablet 3   azithromycin  (ZITHROMAX ) 250 MG tablet Take as directed 6 tablet 0   budesonide  (PULMICORT ) 0.5 MG/2ML nebulizer solution Take 2 mLs (0.5 mg total) by nebulization 2 (two) times daily. 120 mL 11   buPROPion  (WELLBUTRIN  XL) 300 MG 24 hr tablet Take 300 mg by mouth every morning.  1   citalopram  (CELEXA ) 40 MG tablet Take 40 mg by mouth every morning.  0   clotrimazole -betamethasone  (LOTRISONE ) cream Apply 1 Application topically 2 (two) times daily. Apply to buttocks and left ankle & heel. 45 g 2    cyclobenzaprine  (FLEXERIL ) 5 MG tablet Take 1 tablet (5 mg total) by mouth 3 (three) times daily as needed for muscle spasms. 30 tablet 5   diphenoxylate -atropine  (LOMOTIL ) 2.5-0.025 MG tablet Take 1-2 tablets by mouth 4 (four) times daily as needed for diarrhea or loose stools (Max dose of 8 tabs in 24 hours). 30 tablet 0   Dupilumab  (DUPIXENT ) 300 MG/2ML SOAJ Inject 300 mg into the skin every 14 (fourteen) days. 12 mL 0   famotidine  (PEPCID ) 20 MG tablet One after supper 30 tablet 11   Fluticasone -Umeclidin-Vilant (TRELEGY ELLIPTA ) 200-62.5-25 MCG/ACT AEPB Inhale 1 puff into the lungs daily.     glipiZIDE  (GLUCOTROL  XL) 2.5 MG 24 hr tablet TAKE 1 TABLET(2.5 MG) BY MOUTH TWICE DAILY AFTER A MEAL 60 tablet 2   ipratropium-albuterol  (DUONEB) 0.5-2.5 (3) MG/3ML SOLN Take 3 mLs by nebulization 4 (four) times daily. 360 mL 11   metoprolol  succinate (TOPROL -XL) 25 MG 24 hr tablet TAKE 1/2 TABLET(12.5 MG) BY MOUTH DAILY 45 tablet 0   olmesartan  (BENICAR ) 40 MG tablet Take 1 tablet (40 mg total) by mouth daily. 30 tablet 11   ondansetron  (ZOFRAN ) 4 MG tablet Take 1 tablet (4 mg total) by mouth every 8 (eight) hours as needed for nausea or vomiting. 20 tablet 0   pantoprazole  (PROTONIX ) 40 MG tablet One tablet by mouth daily 30 to 60 minutes before the first meal of the day 90 tablet 1   predniSONE  (STERAPRED UNI-PAK 21 TAB) 10 MG (21) TBPK tablet Take by mouth daily. Take 6 tabs by mouth daily  for 2 days, then 5 tabs for 2 days, then 4 tabs for 2 days, then 3 tabs for 2 days, 2 tabs for 2 days, then 1 tab by mouth daily for 2 days 42 tablet 0   triamcinolone  ointment (KENALOG ) 0.5 % Apply topically 2 (two) times daily. 30 g 11   No facility-administered medications prior to visit.     Review of Systems: as per HPI  Constitutional:   No  weight loss, night sweats,  Fevers, chills, fatigue, or  lassitude.  HEENT:   No headaches,  Difficulty swallowing,  Tooth/dental problems, or  Sore throat,                 No sneezing, itching, ear ache, nasal congestion, post nasal drip,   CV:  No chest pain,  Orthopnea, PND, swelling in lower extremities, anasarca, dizziness, palpitations, syncope.   GI  No heartburn, indigestion, abdominal pain, nausea, vomiting, diarrhea, change in bowel habits, loss of appetite, bloody stools.   Resp: No shortness of breath with exertion or at rest.  No excess mucus, no productive cough,  No non-productive cough,  No coughing up of blood.  No change in color of mucus.  No wheezing.  No chest wall deformity  Skin: no rash or lesions.  GU: no  dysuria, change in color of urine, no urgency or frequency.  No flank pain, no hematuria   MS:  No joint pain or swelling.  No decreased range of motion.  No back pain.    Physical Exam  BP 129/76   Pulse (!) 103   Ht 5' 9 (1.753 m)   Wt 181 lb (82.1 kg)   SpO2 98%   BMI 26.73 kg/m   GEN: A/Ox3; pleasant , NAD, well nourished.  Speaks in full sentences- no obvious distress   HEENT:  [place]/AT,  EACs-clear, TMs-wnl, NOSE-clear, THROAT-clear, no lesions, no postnasal drip or exudate noted.   NECK:  Supple w/ fair ROM; no JVD; normal carotid impulses w/o bruits; no thyromegaly or nodules palpated; no lymphadenopathy.    RESP  expiratory wheezes with forced exhalation P & A; w/o, rales/ or rhonchi. no accessory muscle use, no dullness to percussion  CARD:  RRR, no m/r/g, no peripheral edema, pulses intact, no cyanosis or clubbing. Negative homan's sign bilaterally  GI:   Soft & nt; nml bowel sounds; no organomegaly or masses detected.   Musco: Warm bil, no deformities or joint swelling noted.   Neuro: alert, no focal deficits noted.    Skin: Warm, no lesions or rashes    Lab Results:  CBC    Component Value Date/Time   WBC 14.9 (H) 07/01/2024 1040   RBC 4.77 07/01/2024 1040   HGB 15.4 07/01/2024 1040   HCT 46.9 07/01/2024 1040   PLT 287 07/01/2024 1040   MCV 98.3 07/01/2024 1040   MCH 32.3 07/01/2024 1040    MCHC 32.8 07/01/2024 1040   RDW 13.9 07/01/2024 1040   LYMPHSABS 0.5 (L) 06/16/2024 1230   MONOABS 0.2 06/16/2024 1230   EOSABS 0.0 06/16/2024 1230   BASOSABS 0.0 06/16/2024 1230    BMET    Component Value Date/Time   NA 138 07/01/2024 1040   NA 139 01/18/2021 1423   K 4.9 07/01/2024 1040   CL 100 07/01/2024 1040   CO2 25 07/01/2024 1040   GLUCOSE 197 (H) 07/01/2024 1040   BUN 28 (H) 07/01/2024 1040   BUN 14 01/18/2021 1423   CREATININE 1.53 (H) 07/01/2024 1040   CREATININE 0.87 07/01/2016 1032   CALCIUM  9.8 07/01/2024 1040   GFRNONAA 47 (L) 07/01/2024 1040   GFRAA 57 (L) 12/14/2019 1025    BNP    Component Value Date/Time   BNP 224.1 (H) 06/12/2024 1620    ProBNP No results found for: PROBNP  Imaging: DG Chest 2 View Result Date: 07/01/2024 CLINICAL DATA:  Recent hospitalization for COPD, presenting with shortness of breath x2 days. EXAM: CHEST - 2 VIEW COMPARISON:  June 12, 2024 FINDINGS: The heart size and mediastinal contours are within normal limits. Both lungs are clear. The visualized skeletal structures are unremarkable. IMPRESSION: No active cardiopulmonary disease. Electronically Signed   By: Suzen Dials M.D.   On: 07/01/2024 11:04   ECHOCARDIOGRAM COMPLETE Result Date: 06/13/2024    ECHOCARDIOGRAM REPORT   Patient Name:   Gary Wright Date of Exam: 06/13/2024 Medical Rec #:  [id number]      Height:       68.0 in Accession #:    [id number]     Weight:       186.0 lb Date of Birth:  1946-08-13     BSA:          1.982 m Patient Age:    77 years       BP:  106/67 mmHg Patient Gender: M              HR:           90 bpm. Exam Location:  Inpatient Procedure: 2D Echo (Both Spectral and Color Flow Doppler were utilized during            procedure). Indications:    chest pain  History:        Patient has prior history of Echocardiogram examinations, most                 recent 08/04/2023. CAD, chronic kidney disease,                  Signs/Symptoms:Dyspnea; Risk Factors:Dyslipidemia and                 Hypertension.  Sonographer:    Tinnie Barefoot RDCS Referring Phys: [id number] ANAND D HONGALGI IMPRESSIONS  1. Left ventricular ejection fraction, by estimation, is 60 to 65%. The left ventricle has normal function. The left ventricle has no regional wall motion abnormalities. Left ventricular diastolic parameters are consistent with Grade I diastolic dysfunction (impaired relaxation).  2. Right ventricular systolic function is normal. The right ventricular size is normal. There is normal pulmonary artery systolic pressure.  3. The mitral valve is normal in structure. No evidence of mitral valve regurgitation. No evidence of mitral stenosis.  4. The tricuspid valve is abnormal.  5. The aortic valve is tricuspid. There is mild calcification of the aortic valve. There is mild thickening of the aortic valve. Aortic valve regurgitation is not visualized. No aortic stenosis is present.  6. The inferior vena cava is normal in size with greater than 50% respiratory variability, suggesting right atrial pressure of 3 mmHg. FINDINGS  Left Ventricle: Left ventricular ejection fraction, by estimation, is 60 to 65%. The left ventricle has normal function. The left ventricle has no regional wall motion abnormalities. The left ventricular internal cavity size was normal in size. There is  no left ventricular hypertrophy. Left ventricular diastolic parameters are consistent with Grade I diastolic dysfunction (impaired relaxation). Normal left ventricular filling pressure. Right Ventricle: The right ventricular size is normal. Right vetricular wall thickness was not well visualized. Right ventricular systolic function is normal. There is normal pulmonary artery systolic pressure. The tricuspid regurgitant velocity is 2.32 m/s, and with an assumed right atrial pressure of 3 mmHg, the estimated right ventricular systolic pressure is 24.5 mmHg. Left Atrium: Left atrial  size was normal in size. Right Atrium: Right atrial size was normal in size. Pericardium: There is no evidence of pericardial effusion. Mitral Valve: The mitral valve is normal in structure. No evidence of mitral valve regurgitation. No evidence of mitral valve stenosis. Tricuspid Valve: The tricuspid valve is abnormal. Tricuspid valve regurgitation is mild . No evidence of tricuspid stenosis. Aortic Valve: The aortic valve is tricuspid. There is mild calcification of the aortic valve. There is mild thickening of the aortic valve. There is mild aortic valve annular calcification. Aortic valve regurgitation is not visualized. No aortic stenosis  is present. Aortic valve mean gradient measures 3.5 mmHg. Aortic valve peak gradient measures 7.2 mmHg. Aortic valve area, by VTI measures 1.76 cm. Pulmonic Valve: The pulmonic valve was not well visualized. Pulmonic valve regurgitation is not visualized. No evidence of pulmonic stenosis. Aorta: The aortic root and ascending aorta are structurally normal, with no evidence of dilitation. Venous: The inferior vena cava is normal in size with greater than 50% respiratory  variability, suggesting right atrial pressure of 3 mmHg. IAS/Shunts: No atrial level shunt detected by color flow Doppler.  LEFT VENTRICLE PLAX 2D LVIDd:         5.00 cm     Diastology LVIDs:         3.20 cm     LV e' medial:    6.20 cm/s LV PW:         0.80 cm     LV E/e' medial:  11.6 LV IVS:        0.90 cm     LV e' lateral:   8.49 cm/s LVOT diam:     1.80 cm     LV E/e' lateral: 8.4 LV SV:         44 LV SV Index:   22 LVOT Area:     2.54 cm  LV Volumes (MOD) LV vol d, MOD A4C: 79.9 ml LV vol s, MOD A4C: 41.4 ml LV SV MOD A4C:     79.9 ml RIGHT VENTRICLE             IVC RV Basal diam:  2.40 cm     IVC diam: 1.90 cm RV S prime:     11.60 cm/s TAPSE (M-mode): 2.4 cm LEFT ATRIUM             Index        RIGHT ATRIUM           Index LA diam:        3.50 cm 1.77 cm/m   RA Area:     14.30 cm LA Vol (A2C):    46.2 ml 23.31 ml/m  RA Volume:   36.30 ml  18.32 ml/m LA Vol (A4C):   42.2 ml 21.30 ml/m LA Biplane Vol: 46.1 ml 23.26 ml/m  AORTIC VALVE AV Area (Vmax):    1.75 cm AV Area (Vmean):   1.83 cm AV Area (VTI):     1.76 cm AV Vmax:           134.38 cm/s AV Vmean:          85.016 cm/s AV VTI:            0.248 m AV Peak Grad:      7.2 mmHg AV Mean Grad:      3.5 mmHg LVOT Vmax:         92.30 cm/s LVOT Vmean:        61.300 cm/s LVOT VTI:          0.172 m LVOT/AV VTI ratio: 0.69  AORTA Ao Root diam: 3.00 cm Ao Asc diam:  3.00 cm MV E velocity: 71.70 cm/s   TRICUSPID VALVE MV A velocity: 104.00 cm/s  TR Peak grad:   21.5 mmHg MV E/A ratio:  0.69         TR Vmax:        232.00 cm/s                              SHUNTS                             Systemic VTI:  0.17 m                             Systemic Diam: 1.80 cm Dorn Ross MD Electronically signed by Dorn  Branch MD Signature Date/Time: 06/13/2024/6:32:55 PM    Final    CT Angio Chest PE W/Cm &/Or Wo Cm Result Date: 06/12/2024 CLINICAL DATA:  Pulmonary embolism (PE) suspected, low to intermediate prob, positive D-dimer EXAM: CT ANGIOGRAPHY CHEST WITH CONTRAST TECHNIQUE: Multidetector CT imaging of the chest was performed using the standard protocol during bolus administration of intravenous contrast. Multiplanar CT image reconstructions and MIPs were obtained to evaluate the vascular anatomy. RADIATION DOSE REDUCTION: This exam was performed according to the departmental dose-optimization program which includes automated exposure control, adjustment of the mA and/or kV according to patient size and/or use of iterative reconstruction technique. CONTRAST:  75mL OMNIPAQUE  IOHEXOL  350 MG/ML SOLN COMPARISON:  CT chest dated 07/04/2023. FINDINGS: Cardiovascular: Satisfactory opacification of the pulmonary arteries to the segmental level. No evidence of pulmonary embolism. Normal heart size. No pericardial effusion. Multivessel coronary artery calcifications.  Thoracic aorta is normal in caliber with atherosclerotic calcification. Mediastinum/Nodes: No enlarged mediastinal, hilar, or axillary lymph nodes. Thyroid  gland, trachea, and esophagus demonstrate no significant findings. Lungs/Pleura: Centrilobular emphysema. Postoperative changes related to left upper lobectomy. Left lower lobe calcified granuloma. No focal consolidation, pleural effusion, or pneumothorax. Upper Abdomen: No acute abnormality. Punctate left upper pole renal calculus. Musculoskeletal: No acute osseous abnormality. Prior left thoracotomy. Moderate to advanced degenerative changes of the bilateral glenohumeral joints. Degenerative changes of the mid to lower thoracic spine. Review of the MIP images confirms the above findings. IMPRESSION: 1. No evidence of pulmonary embolism. 2. No acute intrathoracic findings. 3. Multivessel coronary artery calcifications. 4. Aortic Atherosclerosis (ICD10-I70.0) and Emphysema (ICD10-J43.9). Electronically Signed   By: Harrietta Sherry M.D.   On: 06/12/2024 12:07   DG Chest Portable 1 View Result Date: 06/12/2024 CLINICAL DATA:  Shortness of breath.  COPD. EXAM: PORTABLE CHEST 1 VIEW COMPARISON:  06/04/2024 FINDINGS: Lordotic positioning noted. The heart size and mediastinal contours are within normal limits. Both lungs are clear. IMPRESSION: No active disease. Electronically Signed   By: Norleen DELENA Kil M.D.   On: 06/12/2024 09:27    methylPREDNISolone  acetate (DEPO-MEDROL ) injection 120 mg     Date Action Dose Route User   Discharged on 07/01/2024   Admitted on 07/01/2024   06/15/2024 1445 Given 120 mg Intramuscular (Right Ventrogluteal) Moody Rancher, CMA          Latest Ref Rng & Units 09/11/2022    9:47 AM  PFT Results  FVC-Pre L 2.96   FVC-Predicted Pre % 70   FVC-Post L 3.22   FVC-Predicted Post % 76   Pre FEV1/FVC % % 54   Post FEV1/FCV % % 57   FEV1-Pre L 1.58   FEV1-Predicted Pre % 52   FEV1-Post L 1.83   DLCO uncorrected ml/min/mmHg  16.42   DLCO UNC% % 65   DLCO corrected ml/min/mmHg 16.42   DLCO COR %Predicted % 65   DLVA Predicted % 78   TLC L 6.78   TLC % Predicted % 96   RV % Predicted % 132     No results found for: NITRICOXIDE   Assessment & Plan:  Gary Wright is a 78 y/o male with PMH of COPD, asthma, lung CA, CAD s/p CABG, HTN, and cardiomyopathy who presents for c/o dyspnea.  He has had multiple exacerbations despite treatment with Trelegy, Dupixent , nebulized Pulmicort , and scheduled nebulizers.  Ohtuvayre  has been ordered and is in the process of approval. Assessment & Plan COPD mixed type (HCC) -  Continue Prednisone  taper; may require small daily Prednisone  dosing in  addition to his current regimen.  Reassess at follow up.  -  Continue Trelegy, Dupixent , Pulmicort , and Duonebs -  Agree with addition of Ohtuvayre  -  Encouraged to attend Pulmonary rehab; efforts at increasing activity will only help decrease likelihood of recurrent exacerbations -  Follow up with Dr. [name]   Return in about 2 months (around 09/04/2024) for COPD.  Candis Dandy, PA-C 07/05/2024

## 2024-07-07 ENCOUNTER — Ambulatory Visit: Payer: Self-pay

## 2024-07-07 ENCOUNTER — Other Ambulatory Visit (HOSPITAL_BASED_OUTPATIENT_CLINIC_OR_DEPARTMENT_OTHER): Payer: Self-pay

## 2024-07-07 MED ORDER — PREDNISONE 10 MG (21) PO TBPK: ORAL_TABLET | Freq: Every day | ORAL | 0 refills | Status: DC

## 2024-07-07 NOTE — Telephone Encounter (Signed)
 FYI Only or Action Required?: Action required by provider: clinical question for provider.  Patient was last seen in primary care on 06/16/2024 by Lucius Krabbe, NP.  Called Nurse Triage reporting Shortness of Breath.  Symptoms began today.  Interventions attempted: Prescription medications:  SABRA  Symptoms are: gradually worsening. Pt. Seen in office yesterday. Asking for refill on Prednisone . I think I need a little more Prednisone  to feel better. Please advise pt.  Triage Disposition: See PCP When Office is Open (Within 3 Days)  Patient/caregiver understands and will follow disposition?: Yes   Copied from CRM [id number]. Topic: Clinical - Red Word Triage >> Jul 07, 2024 10:59 AM Krabbe SAUNDERS wrote: Red Word that prompted transfer to Nurse Triage: Worsening shortness of breath. Reason for Disposition  [1] MODERATE longstanding difficulty breathing (e.g., speaks in phrases, SOB even at rest, pulse 100-120) AND [2] SAME as normal  Answer Assessment - Initial Assessment Questions 1. RESPIRATORY STATUS: Describe your breathing? (e.g., wheezing, shortness of breath, unable to speak, severe coughing)      SOB 2. ONSET: When did this breathing problem begin?      2 WEEKS 3. PATTERN Does the difficult breathing come and go, or has it been constant since it started?      Comes and goes 4. SEVERITY: How bad is your breathing? (e.g., mild, moderate, severe)      moderate 5. RECURRENT SYMPTOM: Have you had difficulty breathing before? If Yes, ask: When was the last time? and What happened that time?      yes 6. CARDIAC HISTORY: Do you have any history of heart disease? (e.g., heart attack, angina, bypass surgery, angioplasty)      yes 7. LUNG HISTORY: Do you have any history of lung disease?  (e.g., pulmonary embolus, asthma, emphysema)     yes 8. CAUSE: What do you think is causing the breathing problem?      unsure 9. OTHER SYMPTOMS: Do you have any other  symptoms? (e.g., chest pain, cough, dizziness, fever, runny nose)     Mild chest pain - tightness 10. O2 SATURATION MONITOR:  Do you use an oxygen saturation monitor (pulse oximeter) at home? If Yes, ask: What is your reading (oxygen level) today? What is your usual oxygen saturation reading? (e.g., 95%)       97% 11. PREGNANCY: Is there any chance you are pregnant? When was your last menstrual period?       N/a 12. TRAVEL: Have you traveled out of the country in the last month? (e.g., travel history, exposures)       no  Protocols used: Breathing Difficulty-A-AH

## 2024-07-07 NOTE — Telephone Encounter (Signed)
 He was seen on 07/05/2024 and was already on a taper.  We can refill x1 for his current taper.

## 2024-07-07 NOTE — Telephone Encounter (Signed)
 Refill for current taper sent to pharmacy, pt notified

## 2024-07-07 NOTE — Telephone Encounter (Signed)
 Please advise pt is asking for Prednisone  refill

## 2024-07-12 ENCOUNTER — Inpatient Hospital Stay (HOSPITAL_COMMUNITY): Admission: RE | Admit: 2024-07-12 | Source: Ambulatory Visit

## 2024-07-12 ENCOUNTER — Telehealth (HOSPITAL_COMMUNITY): Payer: Self-pay

## 2024-07-12 ENCOUNTER — Ambulatory Visit: Admitting: Sports Medicine

## 2024-07-12 ENCOUNTER — Other Ambulatory Visit: Payer: Self-pay

## 2024-07-12 VITALS — HR 99 | Ht 69.0 in | Wt 181.0 lb

## 2024-07-12 DIAGNOSIS — M25552 Pain in left hip: Secondary | ICD-10-CM

## 2024-07-12 DIAGNOSIS — M160 Bilateral primary osteoarthritis of hip: Secondary | ICD-10-CM | POA: Diagnosis not present

## 2024-07-12 DIAGNOSIS — M25551 Pain in right hip: Secondary | ICD-10-CM

## 2024-07-12 NOTE — Telephone Encounter (Signed)
 Patient called at 8:47am to cancel his 10:30am pulmonary rehab orientation, states he is having leg and back pain and has an appt with his doctor today. He states he needs to get the pain managed before he can come to rehab. Cancelled PR appts, removed from schedule. We will need to reach out to him to potentially reschedule at a later date.

## 2024-07-12 NOTE — Patient Instructions (Signed)
 Thank you for coming in today  - Use Tylenol  500 to 1000 mg tablets 2-3 times a day for day-to-day pain relief  - Use meloxicam  7.5 mg daily as needed for pain.  Recommend limiting chronic NSAIDs to 1-2 doses per week to prevent long-term side effects.  Keep your follow-up appointment

## 2024-07-12 NOTE — Progress Notes (Signed)
 Gary Wright Gary Wright [facility] [address] [place] [address] Phone: [phone]   Assessment and Plan:     1. Bilateral hip joint arthritis (Primary) 2. Bilateral hip pain -Chronic with exacerbation, subsequent visit - Consistent with recurrent flare of left hip osteoarthritis, possibly occurring due to positioning in hospital bed with recent hospitalization - Patient has received significant relief with intra-articular CSI in the past.  Last intra-articular CSI performed on 05/27/2024.  Educated patient on risks of repeating CSI within 3 months, which include advancing arthritis process.  Patient verbalized understanding and agreed to repeat left intra-articular CSI today.  Tolerated well per note below - Use Tylenol  500 to 1000 mg tablets 2-3 times a day for day-to-day pain relief - Use meloxicam  7.5 mg daily as needed for pain.  Recommend limiting chronic NSAIDs to 1-2 doses per week to prevent long-term side effects.  -Continue physical activity as tolerated  Procedure: Ultrasound Guided Hip Acetabulofemoral Joint Injection Side: Left Diagnosis: Flare of osteoarthritis US  Indication:  - accuracy is paramount for diagnosis - to ensure therapeutic efficacy or procedural success - to reduce procedural risk  After explaining the procedure, viable alternatives, risks, and answering any questions, consent was given verbally. The site was cleaned with chlorhexidine  prep. An ultrasound transducer was placed on the anterior thigh/hip.   The acetabular joint, labrum, and femoral shaft were identified.  The neurovascular structures were identified and an approach was found specifically avoiding these structures.  A steroid injection was performed under ultrasound guidance with sterile technique using 2ml of 1% lidocaine  without epinephrine  and 40 mg of triamcinolone  (KENALOG ) 40mg /ml. This was well tolerated and resulted in  relief.  Needle was removed and  dressing placed and post injection instructions were given including  a discussion of likely return of pain today after the anesthetic wears off (with the possibility of worsened pain) until the steroid starts to work in 1-3 days.   Pt was advised to call or return to clinic if these symptoms worsen or fail to improve as anticipated. Images permanently stored.   Pertinent previous records reviewed include none   Follow Up: Already scheduled for 3 weeks to review chronic back pain and consider repeat epidural   Subjective:   I, Gary Wright, am serving as a Neurosurgeon for Gary Wright   Chief Complaint: hip pain    HPI:    01/29/2024 Patient is a 78 year old male with hip pain. Patient states that he has had pain for over a year. Hip pain has increased low back pain that radiates down to the hips. Walks with a cane. Prednisone  helped some with his hands but not his hip. No meds for the pain. No numbness or tingling. He has decreased ROM. When he turns his right leg in he has hip pain. Hx of back surgery.    02/12/2024 Patient states pain is now going down the right leg with numbness . When he walks he has sharp pain left side low back    03/12/2024 Patient states he is the same . Hips feel pretty good    04/09/2024 Patient states that he got about 90 % improvement but the pain is starting to come back    05/03/2024 Patient states he got about 60 % improvement he still has pain in the center his back  down his legs . But he has had a lot of Improvement   05/27/2024 Patient states rad thinks he needs another  injection in 3 months . States his hips are bothering him   07/13/2024 Patient states he has been flared for about 2 weeks now on the left side. Right side is okay    Relevant Historical Information: Hypertension, COPD, drug-induced DM, CKD stage IIIb  Additional pertinent review of systems negative.   Current Outpatient Medications:    albuterol  (PROVENTIL ) (2.5 MG/3ML)  0.083% nebulizer solution, Take 3 mLs (2.5 mg total) by nebulization every 4 (four) hours as needed for wheezing or shortness of breath., Disp: 360 mL, Rfl: 5   albuterol  (VENTOLIN  HFA) 108 (90 Base) MCG/ACT inhaler, Inhale 1-2 puffs into the lungs every 4 (four) hours as needed for wheezing or shortness of breath., Disp: 20.1 g, Rfl: 3   atorvastatin  (LIPITOR) 40 MG tablet, TAKE 1 TABLET(40 MG) BY MOUTH DAILY AT 6 PM, Disp: 90 tablet, Rfl: 3   azithromycin  (ZITHROMAX ) 250 MG tablet, Take as directed, Disp: 6 tablet, Rfl: 0   budesonide  (PULMICORT ) 0.5 MG/2ML nebulizer solution, Take 2 mLs (0.5 mg total) by nebulization 2 (two) times daily., Disp: 120 mL, Rfl: 11   buPROPion  (WELLBUTRIN  XL) 300 MG 24 hr tablet, Take 300 mg by mouth every morning., Disp: , Rfl: 1   citalopram  (CELEXA ) 40 MG tablet, Take 40 mg by mouth every morning., Disp: , Rfl: 0   clotrimazole -betamethasone  (LOTRISONE ) cream, Apply 1 Application topically 2 (two) times daily. Apply to buttocks and left ankle & heel., Disp: 45 g, Rfl: 2   cyclobenzaprine  (FLEXERIL ) 5 MG tablet, Take 1 tablet (5 mg total) by mouth 3 (three) times daily as needed for muscle spasms., Disp: 30 tablet, Rfl: 5   diphenoxylate -atropine  (LOMOTIL ) 2.5-0.025 MG tablet, Take 1-2 tablets by mouth 4 (four) times daily as needed for diarrhea or loose stools (Max dose of 8 tabs in 24 hours)., Disp: 30 tablet, Rfl: 0   Dupilumab  (DUPIXENT ) 300 MG/2ML SOAJ, Inject 300 mg into the skin every 14 (fourteen) days., Disp: 12 mL, Rfl: 0   famotidine  (PEPCID ) 20 MG tablet, One after supper, Disp: 30 tablet, Rfl: 11   Fluticasone -Umeclidin-Vilant (TRELEGY ELLIPTA ) 200-62.5-25 MCG/ACT AEPB, Inhale 1 puff into the lungs daily., Disp: , Rfl:    glipiZIDE  (GLUCOTROL  XL) 2.5 MG 24 hr tablet, TAKE 1 TABLET(2.5 MG) BY MOUTH TWICE DAILY AFTER A MEAL, Disp: 60 tablet, Rfl: 2   ipratropium-albuterol  (DUONEB) 0.5-2.5 (3) MG/3ML SOLN, Take 3 mLs by nebulization 4 (four) times daily.,  Disp: 360 mL, Rfl: 11   metoprolol  succinate (TOPROL -XL) 25 MG 24 hr tablet, TAKE 1/2 TABLET(12.5 MG) BY MOUTH DAILY, Disp: 45 tablet, Rfl: 0   olmesartan  (BENICAR ) 40 MG tablet, Take 1 tablet (40 mg total) by mouth daily., Disp: 30 tablet, Rfl: 11   ondansetron  (ZOFRAN ) 4 MG tablet, Take 1 tablet (4 mg total) by mouth every 8 (eight) hours as needed for nausea or vomiting., Disp: 20 tablet, Rfl: 0   pantoprazole  (PROTONIX ) 40 MG tablet, One tablet by mouth daily 30 to 60 minutes before the first meal of the day, Disp: 90 tablet, Rfl: 1   predniSONE  (STERAPRED UNI-PAK 21 TAB) 10 MG (21) TBPK tablet, Take by mouth daily. Take 6 tabs by mouth daily  for 2 days, then 5 tabs for 2 days, then 4 tabs for 2 days, then 3 tabs for 2 days, 2 tabs for 2 days, then 1 tab by mouth daily for 2 days, Disp: 42 tablet, Rfl: 0   triamcinolone  ointment (KENALOG ) 0.5 %, Apply topically 2 (two) times  daily., Disp: 30 g, Rfl: 11   Objective:     Vitals:   07/12/24 1454  Pulse: 99  SpO2: 100%  Weight: 181 lb (82.1 kg)  Height: 5' 9 (1.753 m)      Body mass index is 26.73 kg/m.    Physical Exam:    Gen: Appears well, nad, nontoxic and pleasant Psych: Alert and oriented, appropriate mood and affect Neuro: Decree sensation along anterior thigh wrapping around medial leg on right.  Otherwise sensation intact knee extension and flexion 4+/5 on right compared to left, otherwise strength is 5/5 in upper and lower extremities, muscle tone wnl Skin: no susupicious lesions or rashes   Back - Normal skin, Spine with normal alignment and no deformity.   No tenderness to vertebral process palpation.   Paraspinous muscles are   tender and without spasm NTTP gluteal musculature Straight leg raise  negative   Gait antalgic, short and slow steps Positive FADIR and FABER and logroll left    Electronically signed by:  Odis Wright Gary Wright [facility] 3:03 PM 07/12/24

## 2024-07-19 ENCOUNTER — Other Ambulatory Visit: Payer: Self-pay

## 2024-07-19 ENCOUNTER — Other Ambulatory Visit: Payer: Self-pay | Admitting: Pulmonary Disease

## 2024-07-19 ENCOUNTER — Ambulatory Visit: Payer: Self-pay | Admitting: Pulmonary Disease

## 2024-07-19 DIAGNOSIS — J4550 Severe persistent asthma, uncomplicated: Secondary | ICD-10-CM

## 2024-07-19 DIAGNOSIS — J4489 Other specified chronic obstructive pulmonary disease: Secondary | ICD-10-CM

## 2024-07-19 NOTE — Telephone Encounter (Signed)
 Patient called with continued shortness of breath. Reports he has been using his inhalers and nebulizer but feels like the shortness of breath hasn't improved. Patient is asking to be seen again in the office. This RN unable to schedule patient due to decision tree. Sending over to the office for scheduling.   FYI Only or Action Required?: Action required by provider: request for appointment.  Patient is followed in Pulmonology for COPD, last seen on 07/05/2024 by Charley Conger, PA-C.  Called Nurse Triage reporting Shortness of Breath.  Symptoms began several weeks ago.  Interventions attempted: Rescue inhaler, Maintenance inhaler, and Nebulizer treatments.  Symptoms are: unchanged.  Triage Disposition: See HCP Within 4 Hours (Or PCP Triage)  Patient/caregiver understands and will follow disposition?: No, wishes to speak with PCP  Copied from CRM #[id number]. Topic: Clinical - Red Word Triage >> Jul 19, 2024  1:22 PM Nathanel DEL wrote: Red Word that prompted transfer to Nurse Triage: SOB  dr milburn Charley) told him to call if not better Reason for Disposition  [1] Longstanding difficulty breathing AND [2] not responding to usual therapy  Answer Assessment - Initial Assessment Questions 1. RESPIRATORY STATUS: Describe your breathing? (e.g., wheezing, shortness of breath, unable to speak, severe coughing)      Shortness of breath 2. ONSET: When did this breathing problem begin?      Started back on 07/01/2024 3. PATTERN Does the difficult breathing come and go, or has it been constant since it started?      constant 4. SEVERITY: How bad is your breathing? (e.g., mild, moderate, severe)      moderate 5. RECURRENT SYMPTOM: Have you had difficulty breathing before? If Yes, ask: When was the last time? and What happened that time?      yes 6. CARDIAC HISTORY: Do you have any history of heart disease? (e.g., heart attack, angina, bypass surgery, angioplasty)      yes 7. LUNG  HISTORY: Do you have any history of lung disease?  (e.g., pulmonary embolus, asthma, emphysema)     Yes 8. CAUSE: What do you think is causing the breathing problem?      unsure 9. OTHER SYMPTOMS: Do you have any other symptoms? (e.g., chest pain, cough, dizziness, fever, runny nose)     Dizziness, chest tightness 10. O2 SATURATION MONITOR:  Do you use an oxygen saturation monitor (pulse oximeter) at home? If Yes, ask: What is your reading (oxygen level) today? What is your usual oxygen saturation reading? (e.g., 95%)       96% 12. TRAVEL: Have you traveled out of the country in the last month? (e.g., travel history, exposures)       no  Protocols used: Breathing Difficulty-A-AH

## 2024-07-19 NOTE — Telephone Encounter (Signed)
 Pt requesting refill of specialty medication - routing to Rx team to advise.

## 2024-07-19 NOTE — Telephone Encounter (Signed)
 I called and spoke with the pt  He is requesting appt  Breathing had improved some after last visit, but worsening again off pred  He has noticed some wheezing  Denies any hemoptyis, fever, aches  Sats 93% RA  I scheduled acute visit for tomorrow and advised to seek emergent care sooner if needed

## 2024-07-20 ENCOUNTER — Other Ambulatory Visit: Payer: Self-pay | Admitting: Cardiology

## 2024-07-20 ENCOUNTER — Encounter: Payer: Self-pay | Admitting: Pulmonary Disease

## 2024-07-20 ENCOUNTER — Ambulatory Visit: Admitting: Pulmonary Disease

## 2024-07-20 ENCOUNTER — Ambulatory Visit (HOSPITAL_COMMUNITY)

## 2024-07-20 VITALS — BP 121/85 | HR 97 | Ht 68.0 in | Wt 186.0 lb

## 2024-07-20 DIAGNOSIS — J4489 Other specified chronic obstructive pulmonary disease: Secondary | ICD-10-CM

## 2024-07-20 DIAGNOSIS — Z87891 Personal history of nicotine dependence: Secondary | ICD-10-CM

## 2024-07-20 MED ORDER — PREDNISONE 10 MG PO TABS: ORAL_TABLET | ORAL | 0 refills | Status: AC

## 2024-07-20 MED ORDER — PREDNISONE 10 MG PO TABS: 10.0000 mg | ORAL_TABLET | Freq: Every day | ORAL | 3 refills | Status: DC

## 2024-07-20 NOTE — Telephone Encounter (Signed)
 Refill sent for DUPIXENT  to [facility]: [phone]   Dose: 300mg  [place] every 14 days   Last OV: 07/05/24 with APP Provider: Dr. [name]  Next OV: Today (07/20/24)   Aleck Puls, PharmD, BCPS Clinical Pharmacist  [facility]

## 2024-07-20 NOTE — Progress Notes (Signed)
 Synopsis: Referred in August 2023 for COPD by Victory Hoops, PA  Subjective:   PATIENT ID: Gary Wright GENDER: male DOB: Apr 03, 1946, MRN: [id number]  HPI  Chief Complaint  Patient presents with   Medical Management of Chronic Issues    Acute - PT states bad copd flare up x weeks ago.   Gary Wright is a 78 year old male, former smoker with CAD, hypertension, hyperlipidemia and lung cancer s/p LUL lobectomy 10/30/2018 who returns to pulmonary clinic for COPD.   OV 12/18/2023 He is experiencing a recent flare-up of asthma, characterized by increased use of his rescue inhaler and coughing up yellow mucus. He can usually anticipate these flare-ups as they are preceded by these symptoms. During the current episode, he did not require an overnight hospital stay but did receive a shot of medication.  He is currently experiencing increased wheezing, chest tightness, and a significant amount of coughing, with the mucus changing from clear to yellow. No side effects from his current medications.  He started Dupixent  last month and is scheduled to take his third shot next week. He is currently on a daily dose of 15 mg of prednisone . He also uses a Trelegy inhaler, taking one puff daily, and utilizes an albuterol  nebulizer first thing in the morning. Recently, he has had to use his rescue inhaler more frequently than usual.  OV 01/13/24 He has been experiencing weakness, which he attributes to stopping prednisone  after taking it for two to three months due to his prescription running out and no refills being available. He is awaiting a new delivery of his injections and has not had any adverse reactions to them.  He uses Pulmicort  and DuoNeb nebulizer treatments twice daily and has not been using inhalers. He was previously on Trelegy but stopped due to throat irritation. Since starting new nebulizer medications, he initially coughed up thick yellow mucus, which has since cleared, and he is now  experiencing minimal wheezing and coughing.  He is currently taking two medications for reflux, which have alleviated his heartburn. He also has a persistent sore throat and questions the necessity of continuing a 500 mg medication due to a discount running out.  He quit smoking, although his wife smokes but not around him. He ensures adherence to his nebulizer treatment schedule and has the time to complete them as prescribed.  OV 06/09/24 He experiences significant difficulty breathing over the past two weeks, worsening even at rest. Physical activity exacerbates his dyspnea, causing him to 'fight for his breath.' He denies chest pain but reports chest tightness and no peripheral edema.  He is on a tapering dose of prednisone , initially effective but now less so. He uses his rescue inhaler more frequently and continues nebulizer treatments six times daily, which temporarily relieve wheezing. His medications include Dupixent  every fourteen days and Trelegy 200. A lapse in riflumilast due to a coupon expiration may have impacted his regimen.  He attributes some breathing difficulties to hot and humid weather, which he believes may trigger flare-ups. He feels weaker than usual and is concerned about the rapid tapering of prednisone .  OV 07/20/24 He experiences significant weakness and chest tightness, severely limiting his ability to walk. Prednisone  provides initial relief, but symptoms recur with dose reduction. Current medications include Pulmicort  nebulizer twice daily, Trelegy, and Dupixent  injections biweekly, with the last dose given yesterday. Despite these, wheezing and chest tightness persist without mucus production.  He was not able to obtain [place]  nebulizer treatments due to cost.  Past Medical History:  Diagnosis Date   Acute dyspnea 03/24/2022   Acute exacerbation of chronic obstructive pulmonary disease (COPD) (HCC) 03/25/2022   Asthma    CAD (coronary artery disease) of  artery bypass graft 06/13/2016   Chest pain    COPD (chronic obstructive pulmonary disease) (HCC)    COPD with acute exacerbation (HCC) 11/02/2020   Quit smokig 2019  - PFT's  121/22/23  FEV1 1.83 (60 % ) ratio 0.57  p 15 % improvement from saba p 0 prior to study with DLCO  16.4 (65%)   and FV curve mildly concave  - try off acei and on otc gerd rx 06/02/2023 >>>       Depression 09/01/2007   Qualifier: Diagnosis of  By: Nickola CMA, [place]     Elevated troponin 06/13/2016   History of lung cancer 11/02/2020   Hyperlipidemia 11/02/2020   Hypertension    Prolonged QT interval 11/02/2020   Stress-induced cardiomyopathy 06/13/2016   TOBACCO ABUSE 09/01/2007   Qualifier: Diagnosis of   By: Nickola CMA, [place]           Family History  Problem Relation Age of Onset   Hypertension Mother    Hypertension Father      Social History   Socioeconomic History   Marital status: Married    Spouse name: Deland   Number of children: 2   Years of education: Not on file   Highest education level: Not on file  Occupational History   Occupation: retired    Associate Professor: [employer]  Tobacco Use   Smoking status: Former    Current packs/day: 0.00    Types: Cigarettes    Quit date: 2019    Years since quitting: 6.7   Smokeless tobacco: Never  Vaping Use   Vaping status: Never Used  Substance and Sexual Activity   Alcohol use: No    Comment: No alcohol for 17 years   Drug use: No   Sexual activity: Not on file  Other Topics Concern   Not on file  Social History Narrative   Married for 27 years with 3 children   Social Drivers of Corporate investment banker Strain: Low Risk  (11/18/2023)   Overall Financial Resource Strain (CARDIA)    Difficulty of Paying Living Expenses: Not hard at all  Food Insecurity: No Food Insecurity (06/12/2024)   Hunger Vital Sign    Worried About Running Out of Food in the Last Year: Never true    Ran Out of Food in the Last Year: Never true   Transportation Needs: No Transportation Needs (06/12/2024)   PRAPARE - Administrator, Civil Service (Medical): No    Lack of Transportation (Non-Medical): No  Physical Activity: Insufficiently Active (11/18/2023)   Exercise Vital Sign    Days of Exercise per Week: 3 days    Minutes of Exercise per Session: 10 min  Stress: No Stress Concern Present (11/18/2023)   [employer] of Occupational Health - Occupational Stress Questionnaire    Feeling of Stress : Not at all  Social Connections: Moderately Isolated (06/12/2024)   Social Connection and Isolation Panel    Frequency of Communication with Friends and Family: Three times a week    Frequency of Social Gatherings with Friends and Family: Never    Attends Religious Services: Never    Database administrator or Organizations: No    Attends Banker Meetings: Never    Marital Status: Married  Intimate  Partner Violence: Not At Risk (06/12/2024)   Humiliation, Afraid, Rape, and Kick questionnaire    Fear of Current or Ex-Partner: No    Emotionally Abused: No    Physically Abused: No    Sexually Abused: No     No Known Allergies   Outpatient Medications Prior to Visit  Medication Sig Dispense Refill   albuterol  (PROVENTIL ) (2.5 MG/3ML) 0.083% nebulizer solution Take 3 mLs (2.5 mg total) by nebulization every 4 (four) hours as needed for wheezing or shortness of breath. 360 mL 5   albuterol  (VENTOLIN  HFA) 108 (90 Base) MCG/ACT inhaler Inhale 1-2 puffs into the lungs every 4 (four) hours as needed for wheezing or shortness of breath. 20.1 g 3   atorvastatin  (LIPITOR) 40 MG tablet TAKE 1 TABLET(40 MG) BY MOUTH DAILY AT 6 PM 90 tablet 3   azithromycin  (ZITHROMAX ) 250 MG tablet Take as directed 6 tablet 0   budesonide  (PULMICORT ) 0.5 MG/2ML nebulizer solution Take 2 mLs (0.5 mg total) by nebulization 2 (two) times daily. 120 mL 11   buPROPion  (WELLBUTRIN  XL) 300 MG 24 hr tablet Take 300 mg by mouth every morning.  1    citalopram  (CELEXA ) 40 MG tablet Take 40 mg by mouth every morning.  0   clotrimazole -betamethasone  (LOTRISONE ) cream Apply 1 Application topically 2 (two) times daily. Apply to buttocks and left ankle & heel. 45 g 2   cyclobenzaprine  (FLEXERIL ) 5 MG tablet Take 1 tablet (5 mg total) by mouth 3 (three) times daily as needed for muscle spasms. 30 tablet 5   diphenoxylate -atropine  (LOMOTIL ) 2.5-0.025 MG tablet Take 1-2 tablets by mouth 4 (four) times daily as needed for diarrhea or loose stools (Max dose of 8 tabs in 24 hours). 30 tablet 0   Dupilumab  (DUPIXENT ) 300 MG/2ML SOAJ Inject 300 mg into the skin every 14 (fourteen) days. 12 mL 0   famotidine  (PEPCID ) 20 MG tablet One after supper 30 tablet 11   Fluticasone -Umeclidin-Vilant (TRELEGY ELLIPTA ) 200-62.5-25 MCG/ACT AEPB Inhale 1 puff into the lungs daily.     glipiZIDE  (GLUCOTROL  XL) 2.5 MG 24 hr tablet TAKE 1 TABLET(2.5 MG) BY MOUTH TWICE DAILY AFTER A MEAL 60 tablet 2   ipratropium-albuterol  (DUONEB) 0.5-2.5 (3) MG/3ML SOLN Take 3 mLs by nebulization 4 (four) times daily. 360 mL 11   metoprolol  succinate (TOPROL -XL) 25 MG 24 hr tablet TAKE 1/2 TABLET(12.5 MG) BY MOUTH DAILY 45 tablet 0   olmesartan  (BENICAR ) 40 MG tablet Take 1 tablet (40 mg total) by mouth daily. 30 tablet 11   ondansetron  (ZOFRAN ) 4 MG tablet Take 1 tablet (4 mg total) by mouth every 8 (eight) hours as needed for nausea or vomiting. 20 tablet 0   pantoprazole  (PROTONIX ) 40 MG tablet One tablet by mouth daily 30 to 60 minutes before the first meal of the day 90 tablet 1   predniSONE  (STERAPRED UNI-PAK 21 TAB) 10 MG (21) TBPK tablet Take by mouth daily. Take 6 tabs by mouth daily  for 2 days, then 5 tabs for 2 days, then 4 tabs for 2 days, then 3 tabs for 2 days, 2 tabs for 2 days, then 1 tab by mouth daily for 2 days 42 tablet 0   triamcinolone  ointment (KENALOG ) 0.5 % Apply topically 2 (two) times daily. 30 g 11   No facility-administered medications prior to visit.     Review of Systems  Constitutional:  Negative for chills, fever, malaise/fatigue and weight loss.  HENT:  Negative for congestion, sinus pain and sore  throat.   Eyes: Negative.   Respiratory:  Positive for cough, sputum production and shortness of breath. Negative for hemoptysis.   Cardiovascular:  Negative for chest pain, palpitations, orthopnea, claudication and leg swelling.  Gastrointestinal:  Negative for abdominal pain, heartburn, nausea and vomiting.  Genitourinary: Negative.   Musculoskeletal:  Negative for joint pain and myalgias.  Skin:  Negative for rash.  Neurological:  Negative for weakness.  Endo/Heme/Allergies: Negative.    Objective:   Vitals:   07/20/24 1413  BP: 121/85  Pulse: 97  SpO2: 100%  Weight: 186 lb (84.4 kg)  Height: 5' 8 (1.727 m)    Physical Exam Constitutional:      General: He is not in acute distress. HENT:     Head: Normocephalic and atraumatic.  Eyes:     Conjunctiva/sclera: Conjunctivae normal.  Cardiovascular:     Rate and Rhythm: Normal rate and regular rhythm.     Pulses: Normal pulses.     Heart sounds: Normal heart sounds. No murmur heard. Pulmonary:     Effort: Pulmonary effort is normal.     Breath sounds: Wheezing present. No rhonchi.  Musculoskeletal:     Right lower leg: No edema.     Left lower leg: No edema.  Skin:    General: Skin is warm and dry.  Neurological:     General: No focal deficit present.     Mental Status: He is alert.    CBC    Component Value Date/Time   WBC 14.9 (H) 07/01/2024 1040   RBC 4.77 07/01/2024 1040   HGB 15.4 07/01/2024 1040   HCT 46.9 07/01/2024 1040   PLT 287 07/01/2024 1040   MCV 98.3 07/01/2024 1040   MCH 32.3 07/01/2024 1040   MCHC 32.8 07/01/2024 1040   RDW 13.9 07/01/2024 1040   LYMPHSABS 0.5 (L) 06/16/2024 1230   MONOABS 0.2 06/16/2024 1230   EOSABS 0.0 06/16/2024 1230   BASOSABS 0.0 06/16/2024 1230      Latest Ref Rng & Units 07/01/2024   10:40 AM 06/16/2024    12:30 PM 06/13/2024    3:27 AM  BMP  Glucose 70 - 99 mg/dL 802  819  865   BUN 8 - 23 mg/dL 28  28  19    Creatinine 0.61 - 1.24 mg/dL 8.46  8.89  8.78   Sodium 135 - 145 mmol/L 138  137  138   Potassium 3.5 - 5.1 mmol/L 4.9  4.6  4.3   Chloride 98 - 111 mmol/L 100  102  106   CO2 22 - 32 mmol/L 25  26  24    Calcium  8.9 - 10.3 mg/dL 9.8  8.5  8.4    Chest imaging: CT Chest LCS 2024 Mediastinum/Nodes: No pathologically enlarged mediastinal or axillary lymph nodes. Hilar regions are difficult to definitively evaluate without IV contrast. There may be distal esophageal wall thickening which can be seen with gastroesophageal reflux disease.   Lungs/Pleura: Centrilobular and paraseptal emphysema. Left upper lobectomy. Calcified left lower lobe granuloma. No pleural fluid. Airway is otherwise unremarkable.  CXR 09/11/22 Heart size and mediastinal contours are unremarkable. Postop change from left upper lobectomy. There is no pleural effusion or edema identified. No airspace opacities identified. The visualized osseous structures are notable for bilateral glenohumeral joint osteoarthritis and thoracic spondylosis.  PFT:    Latest Ref Rng & Units 09/11/2022    9:47 AM  PFT Results  FVC-Pre L 2.96   FVC-Predicted Pre % 70   FVC-Post L  3.22   FVC-Predicted Post % 76   Pre FEV1/FVC % % 54   Post FEV1/FCV % % 57   FEV1-Pre L 1.58   FEV1-Predicted Pre % 52   FEV1-Post L 1.83   DLCO uncorrected ml/min/mmHg 16.42   DLCO UNC% % 65   DLCO corrected ml/min/mmHg 16.42   DLCO COR %Predicted % 65   DLVA Predicted % 78   TLC L 6.78   TLC % Predicted % 96   RV % Predicted % 132     Labs:  Path:  Echo 03/24/22: LV EF 40-45%. RV systolic function and size is normal.   Heart Catheterization:  Assessment & Plan:   No diagnosis found.  Discussion: Gary Wright is a 78 year old male, former smoker with CAD, hypertension, hyperlipidemia and lung cancer s/p LUL lobectomy 10/30/2018  who returns to pulmonary clinic for asthma-COPD.   Asthma-Chronic Obstructive Pulmonary Disease (COPD) - He continues to have frequent flairs in his breathing - Initiate longer steroid taper then continue 10mg  prednisone  daily thereafter - Start Zpak antibiotic - Continue trelegy ellipta  1 puff daily - Continue dupixent  injections - PRN albuterol  inhaler and nebulizer treatments  Follow-up in 3 months  Dorn Chill, MD [facility] Office: [phone]   Current Outpatient Medications:    albuterol  (PROVENTIL ) (2.5 MG/3ML) 0.083% nebulizer solution, Take 3 mLs (2.5 mg total) by nebulization every 4 (four) hours as needed for wheezing or shortness of breath., Disp: 360 mL, Rfl: 5   albuterol  (VENTOLIN  HFA) 108 (90 Base) MCG/ACT inhaler, Inhale 1-2 puffs into the lungs every 4 (four) hours as needed for wheezing or shortness of breath., Disp: 20.1 g, Rfl: 3   atorvastatin  (LIPITOR) 40 MG tablet, TAKE 1 TABLET(40 MG) BY MOUTH DAILY AT 6 PM, Disp: 90 tablet, Rfl: 3   azithromycin  (ZITHROMAX ) 250 MG tablet, Take as directed, Disp: 6 tablet, Rfl: 0   budesonide  (PULMICORT ) 0.5 MG/2ML nebulizer solution, Take 2 mLs (0.5 mg total) by nebulization 2 (two) times daily., Disp: 120 mL, Rfl: 11   buPROPion  (WELLBUTRIN  XL) 300 MG 24 hr tablet, Take 300 mg by mouth every morning., Disp: , Rfl: 1   citalopram  (CELEXA ) 40 MG tablet, Take 40 mg by mouth every morning., Disp: , Rfl: 0   clotrimazole -betamethasone  (LOTRISONE ) cream, Apply 1 Application topically 2 (two) times daily. Apply to buttocks and left ankle & heel., Disp: 45 g, Rfl: 2   cyclobenzaprine  (FLEXERIL ) 5 MG tablet, Take 1 tablet (5 mg total) by mouth 3 (three) times daily as needed for muscle spasms., Disp: 30 tablet, Rfl: 5   diphenoxylate -atropine  (LOMOTIL ) 2.5-0.025 MG tablet, Take 1-2 tablets by mouth 4 (four) times daily as needed for diarrhea or loose stools (Max dose of 8 tabs in 24 hours)., Disp: 30 tablet,  Rfl: 0   Dupilumab  (DUPIXENT ) 300 MG/2ML SOAJ, Inject 300 mg into the skin every 14 (fourteen) days., Disp: 12 mL, Rfl: 0   famotidine  (PEPCID ) 20 MG tablet, One after supper, Disp: 30 tablet, Rfl: 11   Fluticasone -Umeclidin-Vilant (TRELEGY ELLIPTA ) 200-62.5-25 MCG/ACT AEPB, Inhale 1 puff into the lungs daily., Disp: , Rfl:    glipiZIDE  (GLUCOTROL  XL) 2.5 MG 24 hr tablet, TAKE 1 TABLET(2.5 MG) BY MOUTH TWICE DAILY AFTER A MEAL, Disp: 60 tablet, Rfl: 2   ipratropium-albuterol  (DUONEB) 0.5-2.5 (3) MG/3ML SOLN, Take 3 mLs by nebulization 4 (four) times daily., Disp: 360 mL, Rfl: 11   metoprolol  succinate (TOPROL -XL) 25 MG 24 hr tablet, TAKE 1/2 TABLET(12.5  MG) BY MOUTH DAILY, Disp: 45 tablet, Rfl: 0   olmesartan  (BENICAR ) 40 MG tablet, Take 1 tablet (40 mg total) by mouth daily., Disp: 30 tablet, Rfl: 11   ondansetron  (ZOFRAN ) 4 MG tablet, Take 1 tablet (4 mg total) by mouth every 8 (eight) hours as needed for nausea or vomiting., Disp: 20 tablet, Rfl: 0   pantoprazole  (PROTONIX ) 40 MG tablet, One tablet by mouth daily 30 to 60 minutes before the first meal of the day, Disp: 90 tablet, Rfl: 1   predniSONE  (STERAPRED UNI-PAK 21 TAB) 10 MG (21) TBPK tablet, Take by mouth daily. Take 6 tabs by mouth daily  for 2 days, then 5 tabs for 2 days, then 4 tabs for 2 days, then 3 tabs for 2 days, 2 tabs for 2 days, then 1 tab by mouth daily for 2 days, Disp: 42 tablet, Rfl: 0   triamcinolone  ointment (KENALOG ) 0.5 %, Apply topically 2 (two) times daily., Disp: 30 g, Rfl: 11

## 2024-07-20 NOTE — Patient Instructions (Signed)
 Continue trelegy ellipta  1 puff daily  Use albuterol  inhaler 1-2 puffs every 4-6 hours as needed  Start high dose steroid taper and continue 10mg  daily of prednisone  there after.   Start Zpak antibiotic  Continue dupixent  injections  Follow up in 3 months, call sooner if needed

## 2024-07-21 ENCOUNTER — Other Ambulatory Visit: Payer: Self-pay | Admitting: Cardiology

## 2024-07-21 ENCOUNTER — Other Ambulatory Visit (HOSPITAL_COMMUNITY): Payer: Self-pay

## 2024-07-21 ENCOUNTER — Other Ambulatory Visit: Payer: Self-pay

## 2024-07-21 MED ORDER — DUPIXENT 300 MG/2ML ~~LOC~~ SOAJ
300.0000 mg | SUBCUTANEOUS | 3 refills | Status: DC
  Filled 2024-07-21 (×2): qty 4, 28d supply, fill #0
  Filled 2024-08-18 – 2024-08-20 (×2): qty 4, 28d supply, fill #1
  Filled 2024-09-17: qty 4, 28d supply, fill #2
  Filled 2024-10-15: qty 4, 28d supply, fill #3

## 2024-07-21 NOTE — Progress Notes (Signed)
 Specialty Pharmacy Refill Coordination Note  Gary Wright is a 78 y.o. male contacted today regarding refills of specialty medication(s) Dupilumab  (Dupixent )   Patient requested Delivery   Delivery date: 07/23/24   Verified address: [address]   [place] [phone]   Medication will be filled on 10.02.25.

## 2024-07-22 ENCOUNTER — Ambulatory Visit (HOSPITAL_COMMUNITY)

## 2024-07-27 ENCOUNTER — Ambulatory Visit (HOSPITAL_COMMUNITY)

## 2024-07-29 ENCOUNTER — Ambulatory Visit (HOSPITAL_COMMUNITY)

## 2024-07-30 ENCOUNTER — Telehealth (HOSPITAL_COMMUNITY): Payer: Self-pay

## 2024-07-30 ENCOUNTER — Encounter (HOSPITAL_COMMUNITY): Payer: Self-pay

## 2024-07-30 NOTE — Telephone Encounter (Signed)
 LVM for pt to schedule for Pulm Rehab. Letter also sent.

## 2024-08-02 ENCOUNTER — Emergency Department (HOSPITAL_COMMUNITY)

## 2024-08-02 ENCOUNTER — Inpatient Hospital Stay (HOSPITAL_COMMUNITY)
Admission: EM | Admit: 2024-08-02 | Discharge: 2024-08-04 | DRG: 871 | Disposition: A | Discharge status: Home / Self Care | Attending: Family Medicine | Admitting: Family Medicine

## 2024-08-02 ENCOUNTER — Encounter (HOSPITAL_COMMUNITY): Payer: Self-pay | Admitting: Emergency Medicine

## 2024-08-02 ENCOUNTER — Other Ambulatory Visit: Payer: Self-pay

## 2024-08-02 DIAGNOSIS — I129 Hypertensive chronic kidney disease with stage 1 through stage 4 chronic kidney disease, or unspecified chronic kidney disease: Secondary | ICD-10-CM | POA: Diagnosis present

## 2024-08-02 DIAGNOSIS — Z8249 Family history of ischemic heart disease and other diseases of the circulatory system: Secondary | ICD-10-CM | POA: Diagnosis not present

## 2024-08-02 DIAGNOSIS — Z85118 Personal history of other malignant neoplasm of bronchus and lung: Secondary | ICD-10-CM

## 2024-08-02 DIAGNOSIS — N1831 Chronic kidney disease, stage 3a: Secondary | ICD-10-CM | POA: Diagnosis present

## 2024-08-02 DIAGNOSIS — J441 Chronic obstructive pulmonary disease with (acute) exacerbation: Secondary | ICD-10-CM | POA: Diagnosis present

## 2024-08-02 DIAGNOSIS — N179 Acute kidney failure, unspecified: Secondary | ICD-10-CM | POA: Diagnosis present

## 2024-08-02 DIAGNOSIS — J439 Emphysema, unspecified: Secondary | ICD-10-CM | POA: Diagnosis present

## 2024-08-02 DIAGNOSIS — E785 Hyperlipidemia, unspecified: Secondary | ICD-10-CM | POA: Diagnosis present

## 2024-08-02 DIAGNOSIS — Z87891 Personal history of nicotine dependence: Secondary | ICD-10-CM

## 2024-08-02 DIAGNOSIS — Z1152 Encounter for screening for COVID-19: Secondary | ICD-10-CM

## 2024-08-02 DIAGNOSIS — Z7951 Long term (current) use of inhaled steroids: Secondary | ICD-10-CM | POA: Diagnosis not present

## 2024-08-02 DIAGNOSIS — I452 Bifascicular block: Secondary | ICD-10-CM | POA: Diagnosis present

## 2024-08-02 DIAGNOSIS — Z7984 Long term (current) use of oral hypoglycemic drugs: Secondary | ICD-10-CM

## 2024-08-02 DIAGNOSIS — E1122 Type 2 diabetes mellitus with diabetic chronic kidney disease: Secondary | ICD-10-CM | POA: Diagnosis present

## 2024-08-02 DIAGNOSIS — R3129 Other microscopic hematuria: Secondary | ICD-10-CM | POA: Diagnosis present

## 2024-08-02 DIAGNOSIS — T380X5A Adverse effect of glucocorticoids and synthetic analogues, initial encounter: Secondary | ICD-10-CM | POA: Diagnosis not present

## 2024-08-02 DIAGNOSIS — Z902 Acquired absence of lung [part of]: Secondary | ICD-10-CM

## 2024-08-02 DIAGNOSIS — I25810 Atherosclerosis of coronary artery bypass graft(s) without angina pectoris: Secondary | ICD-10-CM | POA: Diagnosis present

## 2024-08-02 DIAGNOSIS — J189 Pneumonia, unspecified organism: Principal | ICD-10-CM | POA: Diagnosis present

## 2024-08-02 DIAGNOSIS — Z7952 Long term (current) use of systemic steroids: Secondary | ICD-10-CM

## 2024-08-02 DIAGNOSIS — Z791 Long term (current) use of non-steroidal anti-inflammatories (NSAID): Secondary | ICD-10-CM

## 2024-08-02 DIAGNOSIS — R0902 Hypoxemia: Secondary | ICD-10-CM

## 2024-08-02 DIAGNOSIS — A4151 Sepsis due to Escherichia coli [E. coli]: Secondary | ICD-10-CM | POA: Diagnosis present

## 2024-08-02 DIAGNOSIS — R6521 Severe sepsis with septic shock: Secondary | ICD-10-CM | POA: Diagnosis present

## 2024-08-02 DIAGNOSIS — I2510 Atherosclerotic heart disease of native coronary artery without angina pectoris: Secondary | ICD-10-CM | POA: Diagnosis present

## 2024-08-02 DIAGNOSIS — Z79899 Other long term (current) drug therapy: Secondary | ICD-10-CM

## 2024-08-02 DIAGNOSIS — E1165 Type 2 diabetes mellitus with hyperglycemia: Secondary | ICD-10-CM | POA: Diagnosis present

## 2024-08-02 DIAGNOSIS — R0602 Shortness of breath: Secondary | ICD-10-CM | POA: Diagnosis present

## 2024-08-02 LAB — CBC
HCT: 32 % — ABNORMAL LOW (ref 39.0–52.0)
Hemoglobin: 10.5 g/dL — ABNORMAL LOW (ref 13.0–17.0)
MCH: 32.6 pg (ref 26.0–34.0)
MCHC: 32.8 g/dL (ref 30.0–36.0)
MCV: 99.4 fL (ref 80.0–100.0)
Platelets: 157 K/uL (ref 150–400)
RBC: 3.22 MIL/uL — ABNORMAL LOW (ref 4.22–5.81)
RDW: 13.6 % (ref 11.5–15.5)
WBC: 14.5 K/uL — ABNORMAL HIGH (ref 4.0–10.5)
nRBC: 0 % (ref 0.0–0.2)

## 2024-08-02 LAB — BLOOD CULTURE ID PANEL (REFLEXED) - BCID2

## 2024-08-02 LAB — RESP PANEL BY RT-PCR (RSV, FLU A&B, COVID)  RVPGX2
Influenza A by PCR: NEGATIVE
Influenza B by PCR: NEGATIVE
Resp Syncytial Virus by PCR: NEGATIVE
SARS Coronavirus 2 by RT PCR: NEGATIVE

## 2024-08-02 LAB — COMPREHENSIVE METABOLIC PANEL WITH GFR
ALT: 22 U/L (ref 0–44)
AST: 16 U/L (ref 15–41)
Albumin: 3.2 g/dL — ABNORMAL LOW (ref 3.5–5.0)
Alkaline Phosphatase: 82 U/L (ref 38–126)
Anion gap: 14 (ref 5–15)
BUN: 22 mg/dL (ref 8–23)
CO2: 20 mmol/L — ABNORMAL LOW (ref 22–32)
Calcium: 8.7 mg/dL — ABNORMAL LOW (ref 8.9–10.3)
Chloride: 101 mmol/L (ref 98–111)
Creatinine, Ser: 1.32 mg/dL — ABNORMAL HIGH (ref 0.61–1.24)
GFR, Estimated: 56 mL/min — ABNORMAL LOW (ref >60)
Glucose, Bld: 228 mg/dL — ABNORMAL HIGH (ref 70–99)
Potassium: 4.1 mmol/L (ref 3.5–5.1)
Sodium: 135 mmol/L (ref 135–145)
Total Bilirubin: 1 mg/dL (ref 0.0–1.2)
Total Protein: 5.9 g/dL — ABNORMAL LOW (ref 6.5–8.1)

## 2024-08-02 LAB — I-STAT CG4 LACTIC ACID, ED
Lactic Acid, Venous: 2.3 mmol/L (ref 0.5–1.9)
Lactic Acid, Venous: 1.9 mmol/L (ref 0.5–1.9)

## 2024-08-02 LAB — GLUCOSE, CAPILLARY
Glucose-Capillary: 321 mg/dL — ABNORMAL HIGH (ref 70–99)
Glucose-Capillary: 208 mg/dL — ABNORMAL HIGH (ref 70–99)

## 2024-08-02 LAB — I-STAT VENOUS BLOOD GAS, ED
Acid-Base Excess: 1 mmol/L (ref 0.0–2.0)
Bicarbonate: 23.4 mmol/L (ref 20.0–28.0)
Calcium, Ion: 1.1 mmol/L — ABNORMAL LOW (ref 1.15–1.40)
HCT: 41 % (ref 39.0–52.0)
Hemoglobin: 13.9 g/dL (ref 13.0–17.0)
O2 Saturation: 71 %
Potassium: 4.1 mmol/L (ref 3.5–5.1)
Sodium: 136 mmol/L (ref 135–145)
TCO2: 24 mmol/L (ref 22–32)
pCO2, Ven: 30.2 mmHg — ABNORMAL LOW (ref 44–60)
pH, Ven: 7.498 — ABNORMAL HIGH (ref 7.25–7.43)
pO2, Ven: 33 mmHg (ref 32–45)

## 2024-08-02 LAB — LIPASE, BLOOD: Lipase: 19 U/L (ref 11–51)

## 2024-08-02 LAB — BRAIN NATRIURETIC PEPTIDE: B Natriuretic Peptide: 52 pg/mL (ref 0.0–100.0)

## 2024-08-02 LAB — TROPONIN I (HIGH SENSITIVITY)
Troponin I (High Sensitivity): 14 ng/L (ref <18)
Troponin I (High Sensitivity): 15 ng/L (ref <18)

## 2024-08-02 MED ORDER — IPRATROPIUM-ALBUTEROL 0.5-2.5 (3) MG/3ML IN SOLN
3.0000 mL | Freq: Once | RESPIRATORY_TRACT | Status: AC
  Administered 2024-08-02: 3 mL via RESPIRATORY_TRACT
  Filled 2024-08-02: qty 3

## 2024-08-02 MED ORDER — AZITHROMYCIN 250 MG PO TABS
500.0000 mg | ORAL_TABLET | Freq: Every day | ORAL | Status: AC
  Administered 2024-08-03 – 2024-08-04 (×2): 500 mg via ORAL
  Filled 2024-08-02 (×2): qty 2

## 2024-08-02 MED ORDER — PREDNISONE 20 MG PO TABS
40.0000 mg | ORAL_TABLET | Freq: Every day | ORAL | Status: DC
  Administered 2024-08-02 – 2024-08-04 (×3): 40 mg via ORAL
  Filled 2024-08-02 (×3): qty 2

## 2024-08-02 MED ORDER — IPRATROPIUM-ALBUTEROL 0.5-2.5 (3) MG/3ML IN SOLN
3.0000 mL | Freq: Four times a day (QID) | RESPIRATORY_TRACT | Status: DC
  Filled 2024-08-02: qty 3

## 2024-08-02 MED ORDER — GLIPIZIDE ER 2.5 MG PO TB24
2.5000 mg | ORAL_TABLET | Freq: Every day | ORAL | Status: DC
  Administered 2024-08-02 – 2024-08-04 (×3): 2.5 mg via ORAL
  Filled 2024-08-02 (×5): qty 1

## 2024-08-02 MED ORDER — ACETAMINOPHEN 325 MG PO TABS: 650.0000 mg | ORAL_TABLET | Freq: Four times a day (QID) | ORAL | Status: DC | PRN

## 2024-08-02 MED ORDER — PANTOPRAZOLE SODIUM 40 MG PO TBEC
40.0000 mg | DELAYED_RELEASE_TABLET | Freq: Every day | ORAL | Status: DC
  Administered 2024-08-02 – 2024-08-04 (×3): 40 mg via ORAL
  Filled 2024-08-02 (×3): qty 1

## 2024-08-02 MED ORDER — SODIUM CHLORIDE 0.9 % IV SOLN: 1.0000 g | INTRAVENOUS | Status: DC

## 2024-08-02 MED ORDER — LACTATED RINGERS IV SOLN: INTRAVENOUS | Status: AC

## 2024-08-02 MED ORDER — ACETAMINOPHEN 325 MG PO TABS
975.0000 mg | ORAL_TABLET | Freq: Once | ORAL | Status: AC
  Administered 2024-08-02: 975 mg via ORAL
  Filled 2024-08-02: qty 3

## 2024-08-02 MED ORDER — INSULIN ASPART 100 UNIT/ML IJ SOLN
0.0000 [IU] | Freq: Three times a day (TID) | INTRAMUSCULAR | Status: DC
  Administered 2024-08-02: 11 [IU] via SUBCUTANEOUS
  Administered 2024-08-03 (×3): 5 [IU] via SUBCUTANEOUS
  Administered 2024-08-04 (×3): 3 [IU] via SUBCUTANEOUS

## 2024-08-02 MED ORDER — CEFTRIAXONE SODIUM 2 G IJ SOLR
2.0000 g | INTRAMUSCULAR | Status: DC
  Administered 2024-08-03: 2 g via INTRAVENOUS
  Filled 2024-08-02: qty 20

## 2024-08-02 MED ORDER — ENOXAPARIN SODIUM 40 MG/0.4ML IJ SOSY
40.0000 mg | PREFILLED_SYRINGE | INTRAMUSCULAR | Status: DC
  Administered 2024-08-02 – 2024-08-04 (×3): 40 mg via SUBCUTANEOUS
  Filled 2024-08-02 (×3): qty 0.4

## 2024-08-02 MED ORDER — ATORVASTATIN CALCIUM 40 MG PO TABS
40.0000 mg | ORAL_TABLET | Freq: Every day | ORAL | Status: DC
  Administered 2024-08-02 – 2024-08-04 (×3): 40 mg via ORAL
  Filled 2024-08-02 (×3): qty 1

## 2024-08-02 MED ORDER — LACTATED RINGERS IV BOLUS (SEPSIS)
500.0000 mL | Freq: Once | INTRAVENOUS | Status: AC
  Administered 2024-08-02: 500 mL via INTRAVENOUS

## 2024-08-02 MED ORDER — SODIUM CHLORIDE 0.9 % IV SOLN
500.0000 mg | Freq: Once | INTRAVENOUS | Status: AC
  Administered 2024-08-02: 500 mg via INTRAVENOUS
  Filled 2024-08-02: qty 5

## 2024-08-02 MED ORDER — IPRATROPIUM-ALBUTEROL 0.5-2.5 (3) MG/3ML IN SOLN
3.0000 mL | Freq: Four times a day (QID) | RESPIRATORY_TRACT | Status: DC
  Administered 2024-08-02: 3 mL via RESPIRATORY_TRACT
  Filled 2024-08-02: qty 3

## 2024-08-02 MED ORDER — SODIUM CHLORIDE 0.9 % IV SOLN
2.0000 g | Freq: Once | INTRAVENOUS | Status: AC
  Administered 2024-08-02: 2 g via INTRAVENOUS
  Filled 2024-08-02: qty 20

## 2024-08-02 MED ORDER — METOPROLOL SUCCINATE ER 25 MG PO TB24
12.5000 mg | ORAL_TABLET | Freq: Every day | ORAL | Status: DC
  Administered 2024-08-02 – 2024-08-04 (×3): 12.5 mg via ORAL
  Filled 2024-08-02 (×3): qty 1

## 2024-08-02 MED ORDER — ALBUTEROL SULFATE (2.5 MG/3ML) 0.083% IN NEBU: 2.5000 mg | INHALATION_SOLUTION | RESPIRATORY_TRACT | Status: DC | PRN

## 2024-08-02 MED ORDER — BUDESONIDE 0.5 MG/2ML IN SUSP: 0.5000 mg | Freq: Two times a day (BID) | RESPIRATORY_TRACT | Status: DC

## 2024-08-02 MED ORDER — BUDESON-GLYCOPYRROL-FORMOTEROL 160-9-4.8 MCG/ACT IN AERO
2.0000 | INHALATION_SPRAY | Freq: Two times a day (BID) | RESPIRATORY_TRACT | Status: DC
  Administered 2024-08-03: 2 via RESPIRATORY_TRACT
  Filled 2024-08-02: qty 5.9

## 2024-08-02 MED ORDER — TRAMADOL HCL 50 MG PO TABS
50.0000 mg | ORAL_TABLET | Freq: Four times a day (QID) | ORAL | Status: DC | PRN
  Administered 2024-08-02 – 2024-08-03 (×3): 50 mg via ORAL
  Filled 2024-08-02 (×3): qty 1

## 2024-08-02 MED ORDER — IRBESARTAN 150 MG PO TABS
150.0000 mg | ORAL_TABLET | Freq: Every day | ORAL | Status: DC
  Administered 2024-08-02 – 2024-08-04 (×3): 150 mg via ORAL
  Filled 2024-08-02 (×3): qty 1

## 2024-08-02 MED ORDER — IOHEXOL 350 MG/ML SOLN
75.0000 mL | Freq: Once | INTRAVENOUS | Status: AC | PRN
  Administered 2024-08-02: 75 mL via INTRAVENOUS

## 2024-08-02 MED ORDER — IPRATROPIUM-ALBUTEROL 0.5-2.5 (3) MG/3ML IN SOLN
3.0000 mL | Freq: Two times a day (BID) | RESPIRATORY_TRACT | Status: DC
  Administered 2024-08-03 – 2024-08-04 (×3): 3 mL via RESPIRATORY_TRACT
  Filled 2024-08-02 (×3): qty 3

## 2024-08-02 NOTE — Sepsis Progress Note (Signed)
 Code Sepsis protocol being monitored by e[employer].

## 2024-08-02 NOTE — ED Provider Notes (Signed)
 [facility] Provider Note   CSN: [id number] Arrival date & time: 08/02/24  9557     Patient presents with: Shortness of Breath   Casson Catena is a 78 y.o. male with history of COPD, prolonged QT, history of lung cancer with left lobe removed, tobacco abuse, asthma, hypertension.  Presents to ED complaining of shortness of breath.  Was apparently found with oxygen saturation 90% room air on EMS arrival.  Patient presents to ED complaining of shortness of breath and chest pain.  States shortness of breath and chest pain began this evening.  States he has COPD but denies currently smoking.  Patient does smell of tobacco.  Patient is slightly somnolent on exam.  Patient does take a significant amount of time to answer questions.  He is alert and oriented x 4.  He is wheezing in all lung fields.  He endorses leg swelling but reports this has been ongoing for quite some time.  He received magnesium , Solu-Medrol  and DuoNeb with EMS.   Shortness of Breath Associated symptoms: chest pain        Prior to Admission medications   Medication Sig Start Date End Date Taking? Authorizing Provider  albuterol  (PROVENTIL ) (2.5 MG/3ML) 0.083% nebulizer solution Take 3 mLs (2.5 mg total) by nebulization every 4 (four) hours as needed for wheezing or shortness of breath. 06/09/24   [name] Dorn NOVAK, MD  albuterol  (VENTOLIN  HFA) 108 (90 Base) MCG/ACT inhaler Inhale 1-2 puffs into the lungs every 4 (four) hours as needed for wheezing or shortness of breath. 06/09/24   [name] Dorn NOVAK, MD  atorvastatin  (LIPITOR) 40 MG tablet TAKE 1 TABLET(40 MG) BY MOUTH DAILY AT 6 PM 08/18/23   Pietro Redell RAMAN, MD  budesonide  (PULMICORT ) 0.5 MG/2ML nebulizer solution Take 2 mLs (0.5 mg total) by nebulization 2 (two) times daily. 01/13/24   [name] Dorn NOVAK, MD  buPROPion  (WELLBUTRIN  XL) 300 MG 24 hr tablet Take 300 mg by mouth every morning. 05/11/16   [provider]   citalopram  (CELEXA ) 40 MG tablet Take 40 mg by mouth every morning. 06/01/16   [provider]  clotrimazole -betamethasone  (LOTRISONE ) cream Apply 1 Application topically 2 (two) times daily. Apply to buttocks and left ankle & heel. 02/05/24   Lucius Krabbe, NP  cyclobenzaprine  (FLEXERIL ) 5 MG tablet Take 1 tablet (5 mg total) by mouth 3 (three) times daily as needed for muscle spasms. 03/23/24   Lucius Krabbe, NP  diphenoxylate -atropine  (LOMOTIL ) 2.5-0.025 MG tablet Take 1-2 tablets by mouth 4 (four) times daily as needed for diarrhea or loose stools (Max dose of 8 tabs in 24 hours). 04/14/24   Lucius Krabbe, NP  Dupilumab  (DUPIXENT ) 300 MG/2ML SOAJ Inject 300 mg into the skin every 14 (fourteen) days. 07/21/24   [name] Dorn NOVAK, MD  famotidine  (PEPCID ) 20 MG tablet One after supper 12/22/23   Darlean Ozell NOVAK, MD  Fluticasone -Umeclidin-Vilant (TRELEGY ELLIPTA ) 200-62.5-25 MCG/ACT AEPB Inhale 1 puff into the lungs daily.    [provider]  glipiZIDE  (GLUCOTROL  XL) 2.5 MG 24 hr tablet TAKE 1 TABLET(2.5 MG) BY MOUTH TWICE DAILY AFTER A MEAL 05/03/24   Hudnell, Krabbe, NP  ipratropium-albuterol  (DUONEB) 0.5-2.5 (3) MG/3ML SOLN Take 3 mLs by nebulization 4 (four) times daily. 12/22/23   Darlean Ozell NOVAK, MD  metoprolol  succinate (TOPROL -XL) 25 MG 24 hr tablet TAKE 1/2 TABLET(12.5 MG) BY MOUTH DAILY 07/21/24   Pietro Redell RAMAN, MD  olmesartan  (BENICAR ) 40 MG tablet Take 1 tablet (  40 mg total) by mouth daily. 06/29/24 06/29/25  Darlean Ozell NOVAK, MD  ondansetron  (ZOFRAN ) 4 MG tablet Take 1 tablet (4 mg total) by mouth every 8 (eight) hours as needed for nausea or vomiting. 04/14/24   Lucius Krabbe, NP  pantoprazole  (PROTONIX ) 40 MG tablet One tablet by mouth daily 30 to 60 minutes before the first meal of the day 05/26/24   Darlean Ozell NOVAK, MD  predniSONE  (DELTASONE ) 10 MG tablet Take 1 tablet (10 mg total) by mouth daily with breakfast. 07/20/24   [name] Dorn NOVAK, MD   triamcinolone  ointment (KENALOG ) 0.5 % Apply topically 2 (two) times daily. 06/24/24   Lucius Krabbe, NP    Allergies: Patient has no known allergies.    Review of Systems  Respiratory:  Positive for shortness of breath.   Cardiovascular:  Positive for chest pain and leg swelling.  All other systems reviewed and are negative.   Updated Vital Signs BP 128/73   Pulse (!) 121   Temp (!) 101.1 F (38.4 C) (Rectal)   Resp 13   Ht 5' 8 (1.727 m)   Wt 84.4 kg   SpO2 100%   BMI 28.28 kg/m   Physical Exam Vitals and nursing note reviewed.  Constitutional:      General: He is not in acute distress.    Appearance: He is well-developed.  HENT:     Head: Normocephalic and atraumatic.  Eyes:     Conjunctiva/sclera: Conjunctivae normal.  Cardiovascular:     Rate and Rhythm: Regular rhythm. Tachycardia present.     Heart sounds: No murmur heard. Pulmonary:     Effort: Pulmonary effort is normal. No respiratory distress.     Breath sounds: Wheezing and rales present.  Abdominal:     Palpations: Abdomen is soft.     Tenderness: There is no abdominal tenderness.  Musculoskeletal:        General: No swelling.     Cervical back: Neck supple.     Right lower leg: No edema.     Left lower leg: No edema.  Skin:    General: Skin is warm and dry.     Capillary Refill: Capillary refill takes less than 2 seconds.  Neurological:     Mental Status: He is alert and oriented to person, place, and time. Mental status is at baseline.     Comments: Patient follows commands appropriately.  Equal strength throughout.  Equal sensation throughout.  Alert and oriented x 3.  Psychiatric:        Mood and Affect: Mood normal.     (all labs ordered are listed, but only abnormal results are displayed) Labs Reviewed  CBC - Abnormal; Notable for the following components:      Result Value   WBC 14.5 (*)    RBC 3.22 (*)    Hemoglobin 10.5 (*)    HCT 32.0 (*)    All other components within  normal limits  COMPREHENSIVE METABOLIC PANEL WITH GFR - Abnormal; Notable for the following components:   CO2 20 (*)    Glucose, Bld 228 (*)    Creatinine, Ser 1.32 (*)    Calcium  8.7 (*)    Total Protein 5.9 (*)    Albumin 3.2 (*)    GFR, Estimated 56 (*)    All other components within normal limits  I-STAT VENOUS BLOOD GAS, ED - Abnormal; Notable for the following components:   pH, Ven 7.498 (*)    pCO2, Ven 30.2 (*)  Calcium , Ion 1.10 (*)    All other components within normal limits  I-STAT CG4 LACTIC ACID, ED - Abnormal; Notable for the following components:   Lactic Acid, Venous 2.3 (*)    All other components within normal limits  RESP PANEL BY RT-PCR (RSV, FLU A&B, COVID)  RVPGX2  CULTURE, BLOOD (ROUTINE X 2)  CULTURE, BLOOD (ROUTINE X 2)  LIPASE, BLOOD  BRAIN NATRIURETIC PEPTIDE  I-STAT CG4 LACTIC ACID, ED  TROPONIN I (HIGH SENSITIVITY)  TROPONIN I (HIGH SENSITIVITY)    EKG: EKG Interpretation Date/Time:  Monday August 02 2024 04:48:50 EDT Ventricular Rate:  114 PR Interval:  138 QRS Duration:  125 QT Interval:  349 QTC Calculation: 481 R Axis:   -66  Text Interpretation: Sinus tachycardia RBBB and LAFB Similar to prior tracings Confirmed by Darra Chew [phone]) on 08/02/2024 4:55:31 AM  Radiology: [facility] Chest Portable 1 View Result Date: 08/02/2024 EXAM: 1 VIEW(S) XRAY OF THE CHEST 08/02/2024 04:54:00 AM COMPARISON: Chest radiographs 07/01/2024 and earlier. CLINICAL HISTORY: 78 year old male with shortness of breath. FINDINGS: LINES, TUBES AND DEVICES: External tubing suspected, projecting over the medial left clavicle. LUNGS AND PLEURA: Lung volumes remain normal. No focal pulmonary opacity. No pulmonary edema. No pleural effusion. No pneumothorax. HEART AND MEDIASTINUM: No acute abnormality of the cardiac and mediastinal silhouettes. BONES AND SOFT TISSUES: Advanced glenohumeral degeneration. No acute osseous abnormality. IMPRESSION: 1. No acute cardiopulmonary  abnormality. Electronically signed by: Helayne Hurst MD 08/02/2024 05:03 AM EDT RP Workstation: [id number]    .Critical Care  Performed by: Ruthell Lonni FALCON, PA-C Authorized by: Ruthell Lonni FALCON, PA-C   Critical care provider statement:    Critical care time (minutes):  45   Critical care time was exclusive of:  Separately billable procedures and treating other patients   Critical care was necessary to treat or prevent imminent or life-threatening deterioration of the following conditions:  Respiratory failure   Critical care was time spent personally by me on the following activities:  Blood draw for specimens, development of treatment plan with patient or surrogate, discussions with consultants, discussions with primary provider, evaluation of patient's response to treatment, examination of patient, interpretation of cardiac output measurements, obtaining history from patient or surrogate, ordering and performing treatments and interventions, ordering and review of laboratory studies, ordering and review of radiographic studies, pulse oximetry, re-evaluation of patient's condition and review of old charts   I assumed direction of critical care for this patient from another provider in my specialty: no     Care discussed with: admitting provider      Medications Ordered in the ED  ipratropium-albuterol  (DUONEB) 0.5-2.5 (3) MG/3ML nebulizer solution 3 mL (3 mLs Nebulization Given 08/02/24 0454)  acetaminophen  (TYLENOL ) tablet 975 mg (975 mg Oral Given 08/02/24 0544)  iohexol  (OMNIPAQUE ) 350 MG/ML injection 75 mL (75 mLs Intravenous Contrast Given 08/02/24 9386)     Medical Decision Making Amount and/or Complexity of Data Reviewed Labs: ordered. Radiology: ordered. ECG/medicine tests: ordered.   This is a 78 year old male with a history of COPD who presents to the ED out of concern of chest pain shortness of breath.  Reports this began this evening.  With EMS, patient was hypoxic  on room air.  Patient is wheezing and was given DuoNeb, Solu-Medrol , magnesium .  On arrival to ED, patient tachycardic, on nonrebreather with oxygen saturation 95%.  He has a fever of 101.1 Fahrenheit rectally.  His lung sounds are wheezing throughout.  Abdomen soft and compressible.  Neuroexam  at baseline.  Follows commands appropriately.  Alert and oriented x 4 however slow to respond.  No edema to bilateral lower extremities.  Patient transition from nonrebreather to BiPAP at this time.  Labs collected to include CBC, CMP, i-STAT VBG, BNP, troponin, lipase, lactic acid, blood cultures and viral panel.  Also collected chest x-ray.  Given DuoNeb.  CBC with leukocytosis 14.5, hemoglobin 10.5.  Metabolic panel without electrolyte derangement, stable creatinine, no transaminitis.  Anion gap 14.  BNP not elevated 52.  Initial troponin 14.  Lipase 19.  Viral panel negative for all.  I-STAT VBG reassuring.  Lactic acid 2.3.  At this time we will collect CT of head, CTA of lungs to assess for underlying pneumonia versus PE.  After patient went to CT, patient was transitioned to nasal cannula and is maintaining oxygen saturation 100% on 3 L.  He is still tachycardic to 121.  At end of shift, patient workup not complete. Signed out to oncoming provider Zelaya PA-C. Plan of management discussed. Anticipate admission to hospital secondary to hypoxia.    Final diagnoses:  Shortness of breath  Hypoxia    ED Discharge Orders     None          Ruthell Lonni JULIANNA DEVONNA 08/02/24 0631    Long, Joshua G, MD 08/03/24 [phone]

## 2024-08-02 NOTE — ED Notes (Deleted)
 Not given for pain

## 2024-08-02 NOTE — ED Provider Notes (Signed)
 Accepted handoff at shift change from [place], PA-C. Please see prior provider note for more detail.   Briefly: Patient is 78 y.o.   DDX: concern for pneumonia, bronchitis, PE, stroke  Plan: Disposition per results of CT head and CT angio chest imaging.  Will require admission given patient's new O2 demand and hypoxia on room air.  Physical Exam  BP 116/64   Pulse (!) 118   Temp (!) 101.1 F (38.4 C) (Rectal)   Resp 15   Ht 5' 8 (1.727 m)   Wt 84.4 kg   SpO2 99%   BMI 28.28 kg/m   Physical Exam Vitals and nursing note reviewed.  Constitutional:      General: He is not in acute distress.    Appearance: He is well-developed.  HENT:     Head: Normocephalic and atraumatic.  Eyes:     Conjunctiva/sclera: Conjunctivae normal.  Cardiovascular:     Rate and Rhythm: Regular rhythm. Tachycardia present.     Heart sounds: No murmur heard. Pulmonary:     Effort: Pulmonary effort is normal. No respiratory distress.     Breath sounds: Wheezing and rales present.  Abdominal:     Palpations: Abdomen is soft.     Tenderness: There is no abdominal tenderness.  Musculoskeletal:        General: No swelling.     Cervical back: Neck supple.     Right lower leg: No edema.     Left lower leg: No edema.  Skin:    General: Skin is warm and dry.     Capillary Refill: Capillary refill takes less than 2 seconds.  Neurological:     Mental Status: He is alert and oriented to person, place, and time. Mental status is at baseline.     Comments: Patient follows commands appropriately.  Equal strength throughout.  Equal sensation throughout.  Alert and oriented x 3.  Psychiatric:        Mood and Affect: Mood normal.    Procedures  .Critical Care  Performed by: Evea Sheek A, PA-C Authorized by: Donette Mainwaring A, PA-C   Critical care provider statement:    Critical care time (minutes):  30   Critical care start time:  08/02/2024 6:30 AM   Critical care end time:  08/02/2024 7:00 AM    Critical care time was exclusive of:  Separately billable procedures and treating other patients   Critical care was necessary to treat or prevent imminent or life-threatening deterioration of the following conditions:  Sepsis   Critical care was time spent personally by me on the following activities:  Development of treatment plan with patient or surrogate, discussions with consultants, evaluation of patient's response to treatment, examination of patient, ordering and review of laboratory studies and ordering and review of radiographic studies   I assumed direction of critical care for this patient from another provider in my specialty: yes     Care discussed with: admitting provider     ED Course / MDM    Medical Decision Making Amount and/or Complexity of Data Reviewed Labs: ordered. Radiology: ordered. ECG/medicine tests: ordered.  Risk OTC drugs. Prescription drug management. Decision regarding hospitalization.   Patient is a sign out from [place], PA-C.  Please see their note for full HPI and physical exam findings.  In brief, patient here with concerns of shortness of breath.  While here in the emergency department, patient was noted to have spiked a fever and workup concerning for white count elevation of  14.5 with lactic acid elevated at 2.3 and notable tachycardia.  Workup does not initially reveal any abnormal findings on chest x-ray.  CT imaging of the head and neck currently pending.  CT head negative.  CT angio chest concerning for possible bronchopneumonia in the right upper lobe.  Likely indicating infectious etiology given patient's fever, leukocytosis, and elevated lactic acid.  Code sepsis initiated.  Started patient on a small fluid bolus and infusion given no noted hypotension to indicate septic shock.  Azithromycin  and Rocephin  ordered for antibiotic coverage.  Consult to hospitalist placed for admission.  Patient informed of findings and agreeable with plan for  admission at this time.  Spoke with Dr. Georgina, hospitalist, who will be admitting patient.        Parrish Daddario A, PA-C 08/02/24 9285    Long, Joshua G, MD 08/03/24 [phone]

## 2024-08-02 NOTE — ED Triage Notes (Signed)
 BIB [facility] from home with SOB that has been ongoing all night. EMS reports that pt was low 90s on RA, Duoneb, 125 mg Solumedrol, 2 g Mg, 4 mg Zofran  given. Pt has Hx Stg IV COPD.   BP 158/86 HR 120s Spo2 100% on Duoneb 18 g LAC

## 2024-08-02 NOTE — H&P (Addendum)
 History and Physical    Patient: Gary Wright FMW:[id number] DOB: 1946/10/19 DOA: 08/02/2024 DOS: the patient was seen and examined on 08/02/2024 PCP: Lucius Krabbe, NP  Patient coming from: Home  Chief Complaint:  Chief Complaint  Patient presents with   Shortness of Breath   HPI: Gary Wright is a 78 y.o. male with medical history significant of former tobacco use, COPD, CAD s/p CABG, HTN, DM2, HLD, recent admission from 8/23-25 for AECOPD and was treated with prednisone  40mg  daily and empiric IV CTX/azithromycin , as well as home inhalers p/w DOE iso AECOPD 2/2 CAP.  The patient presented with a flare-up of chronic obstructive pulmonary disease (COPD) and reported difficulty breathing and an episode of apnea the previous night. The patient stated having stage four COPD and experienced frequent flare-ups. The patient normally used a rescue inhaler infrequently but recently had been using it more often, particularly over the last four or five days. The patient also utilizes a nebulizer four times a day and another nebulizer two times a day at baseline. The patient was taking a 30mg  dose of steroids at home with food. The patient did not report any other symptoms.  In the ED, pt tachycardic and febrile to 101.1 x1. Labs notable for Cr 1.32 (baseline 1.0 to 1.1), lactic acid 2.3-->1.9, and WBC 14.5. CTA chest negative for acute PE , but did show known emphysema as well as patchy new distal peribronchial ground-glass opacity in the right upper lobe, favor acute infectious exacerbation - bronchopneumonia. EDP started IV abx and requested medicine admission.   Review of Systems: As mentioned in the history of present illness. All other systems reviewed and are negative. Past Medical History:  Diagnosis Date   Acute dyspnea 03/24/2022   Acute exacerbation of chronic obstructive pulmonary disease (COPD) (HCC) 03/25/2022   Asthma    CAD (coronary artery disease) of artery bypass  graft 06/13/2016   Chest pain    COPD (chronic obstructive pulmonary disease) (HCC)    COPD with acute exacerbation (HCC) 11/02/2020   Quit smokig 2019  - PFT's  121/22/23  FEV1 1.83 (60 % ) ratio 0.57  p 15 % improvement from saba p 0 prior to study with DLCO  16.4 (65%)   and FV curve mildly concave  - try off acei and on otc gerd rx 06/02/2023 >>>       Depression 09/01/2007   Qualifier: Diagnosis of  By: Nickola CMA, [place]     Elevated troponin 06/13/2016   History of lung cancer 11/02/2020   Hyperlipidemia 11/02/2020   Hypertension    Prolonged QT interval 11/02/2020   Stress-induced cardiomyopathy 06/13/2016   TOBACCO ABUSE 09/01/2007   Qualifier: Diagnosis of   By: Nickola CMA, [place]         Past Surgical History:  Procedure Laterality Date   BACK SURGERY     fusion in 1973   CARDIAC CATHETERIZATION N/A 06/13/2016   Procedure: Left Heart Cath and Coronary Angiography;  Surgeon: Lonni Hanson, MD;  Location[facility] INVASIVE CV LAB;  Service: Cardiovascular;  Laterality: N/A;   INGUINAL HERNIA REPAIR Right 04/21/2018   Procedure: RIGHT INGUINAL HERNIA REPAIR ERAS PATHWAY;  Surgeon: Vanderbilt Ned, MD;  Location: [facility] OR;  Service: General;  Laterality: Right;  TAP BLOCK   INSERTION OF MESH Right 04/21/2018   Procedure: INSERTION OF MESH;  Surgeon: Vanderbilt Ned, MD;  Location: [facility] OR;  Service: General;  Laterality: Right;   LUNG REMOVAL, PARTIAL Left    left upper lung  removed about 6-7 years ago reported by pt   TONSILLECTOMY     Social History:  reports that he quit smoking about 6 years ago. His smoking use included cigarettes. He has never used smokeless tobacco. He reports that he does not drink alcohol and does not use drugs. / No Known Allergies-  Family History  Problem Relation Age of Onset   Hypertension Mother    Hypertension Father     Prior to Admission medications   Medication Sig Start Date End Date Taking? Authorizing Provider  albuterol  (PROVENTIL ) (2.5  MG/3ML) 0.083% nebulizer solution Take 3 mLs (2.5 mg total) by nebulization every 4 (four) hours as needed for wheezing or shortness of breath. 06/09/24   [name] Dorn NOVAK, MD  albuterol  (VENTOLIN  HFA) 108 (90 Base) MCG/ACT inhaler Inhale 1-2 puffs into the lungs every 4 (four) hours as needed for wheezing or shortness of breath. 06/09/24   [name] Dorn NOVAK, MD  atorvastatin  (LIPITOR) 40 MG tablet TAKE 1 TABLET(40 MG) BY MOUTH DAILY AT 6 PM 08/18/23   Pietro Redell RAMAN, MD  budesonide  (PULMICORT ) 0.5 MG/2ML nebulizer solution Take 2 mLs (0.5 mg total) by nebulization 2 (two) times daily. 01/13/24   [name] Dorn NOVAK, MD  buPROPion  (WELLBUTRIN  XL) 300 MG 24 hr tablet Take 300 mg by mouth every morning. 05/11/16   [provider]  citalopram  (CELEXA ) 40 MG tablet Take 40 mg by mouth every morning. 06/01/16   [provider]  clotrimazole -betamethasone  (LOTRISONE ) cream Apply 1 Application topically 2 (two) times daily. Apply to buttocks and left ankle & heel. 02/05/24   Lucius Krabbe, NP  cyclobenzaprine  (FLEXERIL ) 5 MG tablet Take 1 tablet (5 mg total) by mouth 3 (three) times daily as needed for muscle spasms. 03/23/24   Lucius Krabbe, NP  diphenoxylate -atropine  (LOMOTIL ) 2.5-0.025 MG tablet Take 1-2 tablets by mouth 4 (four) times daily as needed for diarrhea or loose stools (Max dose of 8 tabs in 24 hours). 04/14/24   Lucius Krabbe, NP  Dupilumab  (DUPIXENT ) 300 MG/2ML SOAJ Inject 300 mg into the skin every 14 (fourteen) days. 07/21/24   [name] Dorn NOVAK, MD  famotidine  (PEPCID ) 20 MG tablet One after supper 12/22/23   Darlean Ozell NOVAK, MD  Fluticasone -Umeclidin-Vilant (TRELEGY ELLIPTA ) 200-62.5-25 MCG/ACT AEPB Inhale 1 puff into the lungs daily.    [provider]  glipiZIDE  (GLUCOTROL  XL) 2.5 MG 24 hr tablet TAKE 1 TABLET(2.5 MG) BY MOUTH TWICE DAILY AFTER A MEAL 05/03/24   Hudnell, Krabbe, NP  ipratropium-albuterol  (DUONEB) 0.5-2.5 (3) MG/3ML SOLN Take 3 mLs by  nebulization 4 (four) times daily. 12/22/23   Darlean Ozell NOVAK, MD  meloxicam  (MOBIC ) 15 MG tablet Take 15 mg by mouth daily. 07/16/24   [provider]  metoprolol  succinate (TOPROL -XL) 25 MG 24 hr tablet TAKE 1/2 TABLET(12.5 MG) BY MOUTH DAILY 07/21/24   Pietro Redell RAMAN, MD  olmesartan  (BENICAR ) 40 MG tablet Take 1 tablet (40 mg total) by mouth daily. 06/29/24 06/29/25  Darlean Ozell NOVAK, MD  ondansetron  (ZOFRAN ) 4 MG tablet Take 1 tablet (4 mg total) by mouth every 8 (eight) hours as needed for nausea or vomiting. 04/14/24   Lucius Krabbe, NP  pantoprazole  (PROTONIX ) 40 MG tablet One tablet by mouth daily 30 to 60 minutes before the first meal of the day 05/26/24   Darlean Ozell NOVAK, MD  predniSONE  (DELTASONE ) 10 MG tablet Take 1 tablet (10 mg total) by mouth daily with breakfast. 07/20/24   [name] Dorn NOVAK, MD  triamcinolone   ointment (KENALOG ) 0.5 % Apply topically 2 (two) times daily. 06/24/24   Lucius Krabbe, NP    Physical Exam: Vitals:   08/02/24 0700 08/02/24 0727 08/02/24 1015 08/02/24 1112  BP: 116/64  122/69   Pulse: (!) 118  (!) 109   Resp:   11   Temp:  99 F (37.2 C)  98.2 F (36.8 C)  TempSrc:  Oral  Oral  SpO2: 99%  99%   Weight:      Height:       General: Alert, oriented x3, resting comfortably in no acute distress HEENT: EOMI, oropharynx clear, moist mucous membranes, hearing intact Neck: Trachea midline and no gross thyromegaly Respiratory: Lungs clear to auscultation bilaterally with normal respiratory effort; no w/r/r Cardiovascular: Regular rate and rhythm w/o m/r/g Abdomen: Soft, nontender, nondistended. Positive bowel sounds MSK: No obvious joint deformities or swelling Skin: No obvious rashes or lesions Neurologic: Awake, alert, spontaneously moves all extremities, strength intact Psychiatric: Appropriate mood and affect, conversational and cooperative   Data Reviewed:  Lab Results  Component Value Date   WBC 14.5 (H) 08/02/2024   HGB 13.9  08/02/2024   HCT 41.0 08/02/2024   MCV 99.4 08/02/2024   PLT 157 08/02/2024   Lab Results  Component Value Date   GLUCOSE 228 (H) 08/02/2024   CALCIUM  8.7 (L) 08/02/2024   NA 136 08/02/2024   K 4.1 08/02/2024   CO2 20 (L) 08/02/2024   CL 101 08/02/2024   BUN 22 08/02/2024   CREATININE 1.32 (H) 08/02/2024   Lab Results  Component Value Date   ALT 22 08/02/2024   AST 16 08/02/2024   ALKPHOS 82 08/02/2024   BILITOT 1.0 08/02/2024   No results found for: INR, PROTIME Radiology: CT Angio Chest PE W and/or Wo Contrast Result Date: 08/02/2024 EXAM: CTA of the Chest with contrast for PE 08/02/2024 06:13:05 AM TECHNIQUE: CTA of the chest was performed after the administration of 75 mL of iohexol  (OMNIPAQUE ) 350 MG/ML injection. Multiplanar reformatted images are provided for review. MIP images are provided for review. Automated exposure control, iterative reconstruction, and/or weight based adjustment of the mA/kV was utilized to reduce the radiation dose to as low as reasonably achievable. COMPARISON: Portable chest x-ray 08/02/2024. CTA chest 06/12/2024. CLINICAL HISTORY: 78 year old male with suspected PE, high probability, ongoing SOB, and history of Stage IV COPD. FINDINGS: PULMONARY ARTERIES: Pulmonary arteries are adequately opacified for evaluation. Good pulmonary artery contrast timing respiratory motion, primarily in the lower lungs. No pulmonary artery filling defect. Main pulmonary artery is normal in caliber. MEDIASTINUM: The heart demonstrates no acute abnormality. Normal heart size. No pericardial effusion. Calcified coronary artery and aortic atherosclerosis. LYMPH NODES: No mediastinal, hilar or axillary lymphadenopathy. LUNGS AND PLEURA: Asymmetric centrilobular emphysema. Evidence of previous left upper lobectomy. Patchy new distal peribronchial ground glass opacity in the right upper lobe (series 6, images 44 and 53) appears infectious/inflammatory. Major airways remain  patent. Calcified granuloma in the left lung is benign (no follow up imaging recommended). No pleural effusion or pneumothorax. UPPER ABDOMEN: Small hiatal hernia has not significantly changed. Motion artifact in the upper abdomen. No upper abdominal pneumoperitoneum or free fluid. SOFT TISSUES AND BONES: Chronic left posterior rib fractures, chronic unhealed posterior left 8th rib fracture. No acute or suspicious osseous lesion. No acute soft tissue abnormality. IMPRESSION: 1. No pulmonary embolism. 2. Emphysema. Prior left upper lobectomy. Patchy new distal peribronchial ground-glass opacity in the right upper lobe, favor acute infectious exacerbation - Bronchopneumonia. Electronically signed by:  Helayne Hurst MD 08/02/2024 06:38 AM EDT RP Workstation: [id number]   CT Head Wo Contrast Result Date: 08/02/2024 EXAM: CT HEAD WITHOUT CONTRAST 08/02/2024 06:13:05 AM TECHNIQUE: CT of the head was performed without the administration of intravenous contrast. Automated exposure control, iterative reconstruction, and/or weight based adjustment of the mA/kV was utilized to reduce the radiation dose to as low as reasonably achievable. COMPARISON: Report of brain MRI 10/12/2008 (no images available). CLINICAL HISTORY: 78 year old male. Mental status change, unknown cause. AMS. FINDINGS: BRAIN AND VENTRICLES: No acute intracranial hemorrhage identified. No evidence of acute infarct. Asymmetric extra-axial fluid over both cerebral hemispheres appears to be normal subarachnoid space CSF. No convincing subdural collection. No intracranial mass effect or midline shift. Brain volume is within normal limits for age. Gray-white differentiation is within normal limits for age. Calcified atherosclerosis at the skull base. No suspicious intracranial vascular hyperdensity. No hydrocephalus. ORBITS: Postoperative changes to both globes. SINUSES: No acute abnormality. SOFT TISSUES AND SKULL: Bulky craniocervical junction and C1-C2 bony  degeneration. No acute soft tissue abnormality. No skull fracture. IMPRESSION: 1. No acute intracranial abnormality. Negative for age non-contrast CT appearance of the brain. Electronically signed by: Helayne Hurst MD 08/02/2024 06:32 AM EDT RP Workstation: [id number]   DG Chest Portable 1 View Result Date: 08/02/2024 EXAM: 1 VIEW(S) XRAY OF THE CHEST 08/02/2024 04:54:00 AM COMPARISON: Chest radiographs 07/01/2024 and earlier. CLINICAL HISTORY: 78 year old male with shortness of breath. FINDINGS: LINES, TUBES AND DEVICES: External tubing suspected, projecting over the medial left clavicle. LUNGS AND PLEURA: Lung volumes remain normal. No focal pulmonary opacity. No pulmonary edema. No pleural effusion. No pneumothorax. HEART AND MEDIASTINUM: No acute abnormality of the cardiac and mediastinal silhouettes. BONES AND SOFT TISSUES: Advanced glenohumeral degeneration. No acute osseous abnormality. IMPRESSION: 1. No acute cardiopulmonary abnormality. Electronically signed by: Helayne Hurst MD 08/02/2024 05:03 AM EDT RP Workstation: [id number]    Assessment and Plan: 89M h/o former tobacco use, COPD, CAD s/p CABG, HTN, DM2, HLD, recent admission from 8/23-8/25 for AECOPD and was treated with prednisone  40mg  daily and empiric IV CTX/azithromycin , as well as home inhalers p/w DOE iso AECOPD 2/2 CAP.  Septic shock AECOPD CAP Sepsis as evidenced by lactic acid >2 -IV CTX 1g daily to complete 5 day CAP course -PO azithromycin  500mg  daily to complete 3 day CAP course -PO prednisone  40mg  daily for 5 days for AECOPD -Breztri BID in place of pta Trelegy -Duonebs prn -Wean O2 as tolerated -Ambulatory pulse ox prior to d/c -Will need close OP Pulmonology f/u  AKI Cr 1.32 (baseline 1.0 to 1.1) -MIVF: NS at 150cc/h for 24h -Strict I&Os and daily weights (standing preferred) -F/u BMP daily -Renally dose medications for CrCl -Avoid lovenox , NSAIDs, morphine , Fleet's phosphate enema, regular insulin , contrast; no  gadolinium for MRI to avoid nephrogenic systemic fibrosis -Consider renal US  and nephrology consult if worsening AKI  HTN -Irbesartan 150mg  daily (in place of pta Benicar  40mg ; equivalent to 1/2 dose)  DM2 -PTA glipizide  2.5mg  daily -SSI TID AC prn -Low card diet  CAD s/p CABG -PTA metoprolol  and atorvastatin    Advance Care Planning:   Code Status: Full Code   Consults: N/A  Family Communication: Spouse  Severity of Illness: The appropriate patient status for this patient is INPATIENT. Inpatient status is judged to be reasonable and necessary in order to provide the required intensity of service to ensure the patient's safety. The patient's presenting symptoms, physical exam findings, and initial radiographic and laboratory data in the context of their  chronic comorbidities is felt to place them at high risk for further clinical deterioration. Furthermore, it is not anticipated that the patient will be medically stable for discharge from the hospital within 2 midnights of admission.   * I certify that at the point of admission it is my clinical judgment that the patient will require inpatient hospital care spanning beyond 2 midnights from the point of admission due to high intensity of service, high risk for further deterioration and high frequency of surveillance required.*   ------- I spent 65 minutes reviewing previous notes, at the bedside counseling/discussing the treatment plan, and performing clinical documentation.  Author: Marsha Ada, MD 08/02/2024 12:19 PM  For on call review [email or web address].

## 2024-08-02 NOTE — Progress Notes (Signed)
 PHARMACY - PHYSICIAN COMMUNICATION CRITICAL VALUE ALERT - BLOOD CULTURE IDENTIFICATION (BCID)  Gary Wright is an 78 y.o. male who presented to [facility] on 08/02/2024 with a chief complaint of SOB, differential including PNA and COPD exacerbation.   Assessment:  10/13 bcx: 3/4 GNR -- BCID: E coli, no resistance  Name of physician (or Provider) Contacted: Lynwood Kipper, PA  Current antibiotics: Ceftriaxone  + azithromycin   Changes to prescribed antibiotics recommended:  Patient is on recommended antibiotics - No changes needed  Izetta Carl, PharmD PGY1 Pharmacy Resident

## 2024-08-02 NOTE — ED Notes (Signed)
 PT transitioned to [place] O2 at 3 LPM via  and tolerating with 100% O2 sat.

## 2024-08-03 ENCOUNTER — Ambulatory Visit: Admitting: Family

## 2024-08-03 ENCOUNTER — Ambulatory Visit (HOSPITAL_COMMUNITY)

## 2024-08-03 DIAGNOSIS — J189 Pneumonia, unspecified organism: Secondary | ICD-10-CM | POA: Diagnosis not present

## 2024-08-03 LAB — CBC
HCT: 34.5 % — ABNORMAL LOW (ref 39.0–52.0)
Hemoglobin: 11.4 g/dL — ABNORMAL LOW (ref 13.0–17.0)
MCH: 31.8 pg (ref 26.0–34.0)
MCHC: 33 g/dL (ref 30.0–36.0)
MCV: 96.1 fL (ref 80.0–100.0)
Platelets: 250 K/uL (ref 150–400)
RBC: 3.59 MIL/uL — ABNORMAL LOW (ref 4.22–5.81)
RDW: 13.5 % (ref 11.5–15.5)
WBC: 15.8 K/uL — ABNORMAL HIGH (ref 4.0–10.5)
nRBC: 0 % (ref 0.0–0.2)

## 2024-08-03 LAB — URINALYSIS, ROUTINE W REFLEX MICROSCOPIC
Bacteria, UA: NONE SEEN
Bilirubin Urine: NEGATIVE
Glucose, UA: 150 mg/dL — AB
Ketones, ur: NEGATIVE mg/dL
Nitrite: NEGATIVE
Protein, ur: 30 mg/dL — AB
Specific Gravity, Urine: 1.023 (ref 1.005–1.030)
pH: 5 (ref 5.0–8.0)

## 2024-08-03 LAB — BASIC METABOLIC PANEL WITH GFR
Anion gap: 9 (ref 5–15)
BUN: 23 mg/dL (ref 8–23)
CO2: 23 mmol/L (ref 22–32)
Calcium: 8.3 mg/dL — ABNORMAL LOW (ref 8.9–10.3)
Chloride: 104 mmol/L (ref 98–111)
Creatinine, Ser: 1.18 mg/dL (ref 0.61–1.24)
GFR, Estimated: >60 mL/min (ref >60)
Glucose, Bld: 238 mg/dL — ABNORMAL HIGH (ref 70–99)
Potassium: 4.3 mmol/L (ref 3.5–5.1)
Sodium: 136 mmol/L (ref 135–145)

## 2024-08-03 LAB — GLUCOSE, CAPILLARY
Glucose-Capillary: 212 mg/dL — ABNORMAL HIGH (ref 70–99)
Glucose-Capillary: 244 mg/dL — ABNORMAL HIGH (ref 70–99)
Glucose-Capillary: 218 mg/dL — ABNORMAL HIGH (ref 70–99)
Glucose-Capillary: 197 mg/dL — ABNORMAL HIGH (ref 70–99)

## 2024-08-03 MED ORDER — BUDESONIDE 0.25 MG/2ML IN SUSP
0.2500 mg | Freq: Two times a day (BID) | RESPIRATORY_TRACT | Status: DC
  Administered 2024-08-03 – 2024-08-04 (×3): 0.25 mg via RESPIRATORY_TRACT
  Filled 2024-08-03 (×3): qty 2

## 2024-08-03 MED ORDER — OXYCODONE HCL 5 MG PO TABS
5.0000 mg | ORAL_TABLET | Freq: Four times a day (QID) | ORAL | Status: DC | PRN
  Administered 2024-08-03 – 2024-08-04 (×5): 5 mg via ORAL
  Filled 2024-08-03 (×5): qty 1

## 2024-08-03 MED ORDER — SODIUM CHLORIDE 0.9 % IV SOLN
2.0000 g | INTRAVENOUS | Status: DC
  Administered 2024-08-04: 2 g via INTRAVENOUS
  Filled 2024-08-03: qty 20

## 2024-08-03 MED ORDER — ARFORMOTEROL TARTRATE 15 MCG/2ML IN NEBU
15.0000 ug | INHALATION_SOLUTION | Freq: Two times a day (BID) | RESPIRATORY_TRACT | Status: DC
  Administered 2024-08-03 – 2024-08-04 (×3): 15 ug via RESPIRATORY_TRACT
  Filled 2024-08-03 (×3): qty 2

## 2024-08-03 NOTE — Plan of Care (Signed)

## 2024-08-03 NOTE — Progress Notes (Signed)
 PROGRESS NOTE  Gary Wright FMW:[id number] DOB: 02-Apr-1946 DOA: 08/02/2024 PCP: Lucius Krabbe, NP   LOS: 1 day   Brief Narrative / Interim history: 78 year old male with with prior tobacco use, stage IV COPD, CAD status post CABG, HTN, HLD, diabetes mellitus comes into the hospital with increasing shortness of breath.  He was recently hospitalized in August with similar symptoms.  He has been followed by pulmonary as an outpatient and he seems to have a poor baseline, he frequently gets out of breath and required steroids treatments, currently on 30 mg of prednisone  at home but he feels like that not working.  Over the last 4 to 5 days has been having increased shortness of breath, has been using his home nebulizers, inhalers without much success  Subjective / 24h Interval events: He complains of ongoing wheezing and dyspnea.  He feels okay at rest but barely able to take few steps  Assesement and Plan: Principal problem Acute on chronic COPD exacerbation, pneumonia-imaging in the ED with a CT angiogram was negative for PE but it did show new distal peribronchial ground glass opacities in the right upper lobe, favoring infectious.  He was placed on antibiotics with ceftriaxone  and azithromycin , continue - Placed on DuoNebs, Brovana , Pulmicort , steroids.  Continue supportive care  Active problems Sepsis due to E. coli bacteremia -met sepsis criteria with fever of 101.1, tachycardia, leukocytosis and source.  While technically source could be lung it would be rather unusual.  He does not have any GI symptoms, reports some urinary discomfort so would it may have a urinary source.  Will obtain urinalysis, however he has already received ceftriaxone  - Continue antibiotics, await susceptibilities  Essential hypertension-blood pressure acceptable, continue home medications as below  Type 2 diabetes mellitus, with steroid-induced hyperglycemia-A1c 6.1.  Has been placed on sliding scale,  continue  Hyperlipidemia-continue statin  CAD with history of CABG-continue home medications, he has no chest pain  CKD 3A-baseline creatinine 1.1-1.3, currently at baseline  Scheduled Meds:  arformoterol   15 mcg Nebulization BID   atorvastatin   40 mg Oral Daily   azithromycin   500 mg Oral Daily   budesonide  (PULMICORT ) nebulizer solution  0.25 mg Nebulization BID   enoxaparin  (LOVENOX ) injection  40 mg Subcutaneous Q24H   glipiZIDE   2.5 mg Oral Q breakfast   insulin  aspart  0-15 Units Subcutaneous TID WC   ipratropium-albuterol   3 mL Nebulization BID   irbesartan  150 mg Oral Daily   metoprolol  succinate  12.5 mg Oral Daily   pantoprazole   40 mg Oral Daily   predniSONE   40 mg Oral Q breakfast   Continuous Infusions:  cefTRIAXone  (ROCEPHIN )  IV 2 g (08/03/24 0922)   PRN Meds:.acetaminophen , albuterol , traMADol  Current Outpatient Medications  Medication Instructions   acetaminophen  (TYLENOL ) 1,000 mg, 2 times daily PRN   albuterol  (PROVENTIL ) 2.5 mg, Nebulization, Every 4 hours PRN   albuterol  (VENTOLIN  HFA) 108 (90 Base) MCG/ACT inhaler 1-2 puffs, Inhalation, Every 4 hours PRN   atorvastatin  (LIPITOR) 40 MG tablet TAKE 1 TABLET(40 MG) BY MOUTH DAILY AT 6 PM   budesonide  (PULMICORT ) 0.5 mg, Nebulization, 2 times daily   buPROPion  (WELLBUTRIN  XL) 300 mg, Daily   citalopram  (CELEXA ) 40 mg, Daily   clotrimazole -betamethasone  (LOTRISONE ) cream 1 Application, Topical, 2 times daily, Apply to buttocks and left ankle & heel.   cyclobenzaprine  (FLEXERIL ) 5 mg, Oral, 3 times daily PRN   Dupixent  300 mg, Subcutaneous, Every 14 days   Fluticasone -Umeclidin-Vilant (TRELEGY ELLIPTA ) 200-62.5-25 MCG/ACT AEPB 1  puff, Every evening   glipiZIDE  (GLUCOTROL  XL) 2.5 MG 24 hr tablet TAKE 1 TABLET(2.5 MG) BY MOUTH TWICE DAILY AFTER A MEAL   ipratropium-albuterol  (DUONEB) 0.5-2.5 (3) MG/3ML SOLN 3 mLs, Nebulization, 4 times daily   MAGNESIUM  PO 1 tablet, At bedtime PRN   MELATONIN PO 1 tablet, At  bedtime PRN   meloxicam  (MOBIC ) 15 mg, Oral, Daily PRN   metoprolol  succinate (TOPROL -XL) 25 MG 24 hr tablet TAKE 1/2 TABLET(12.5 MG) BY MOUTH DAILY   olmesartan  (BENICAR ) 40 mg, Oral, Daily   pantoprazole  (PROTONIX ) 40 MG tablet One tablet by mouth daily 30 to 60 minutes before the first meal of the day   predniSONE  (DELTASONE ) 10 mg, Oral, Daily with breakfast   triamcinolone  ointment (KENALOG ) 0.5 % Topical, 2 times daily    Diet Orders (From admission, onward)     Start     Ordered   08/02/24 1230  Diet Carb Modified Fluid consistency: Thin; Room service appropriate? Yes  Diet effective now       Question Answer Comment  Diet-HS Snack? Nothing   Calorie Level Medium 1600-2000   Fluid consistency: Thin   Room service appropriate? Yes      08/02/24 1230            DVT prophylaxis: enoxaparin  (LOVENOX ) injection 40 mg Start: 08/02/24 1600   Lab Results  Component Value Date   PLT 250 08/03/2024      Code Status: Full Code  Family Communication: No family at bedside  Status is: Inpatient Remains inpatient appropriate because: Severity of illness   Level of care: Telemetry Medical  Consultants:  None  Objective: Vitals:   08/02/24 2354 08/03/24 0402 08/03/24 0749 08/03/24 0811  BP: 134/75 (!) 150/89  138/86  Pulse: 97 87 85 78  Resp: 16  18 17   Temp: 98.1 F (36.7 C) 98.2 F (36.8 C)  97.9 F (36.6 C)  TempSrc: Oral   Oral  SpO2: 98% 96% 97% 95%  Weight:      Height:        Intake/Output Summary (Last 24 hours) at 08/03/2024 1028 Last data filed at 08/03/2024 0858 Gross per 24 hour  Intake 480 ml  Output 1575 ml  Net -1095 ml   Wt Readings from Last 3 Encounters:  08/02/24 84.4 kg  07/20/24 84.4 kg  07/12/24 82.1 kg    Examination:  Constitutional: NAD Eyes: no scleral icterus ENMT: Mucous membranes are moist.  Neck: normal, supple Respiratory: Diffuse bilateral bilateral wheezing throughout upper and lower lung fields, increased  respiratory effort Cardiovascular: Regular rate and rhythm, no murmurs / rubs / gallops. No LE edema.  Abdomen: non distended, no tenderness. Bowel sounds positive.  Musculoskeletal: no clubbing / cyanosis.   Data Reviewed: I have independently reviewed following labs and imaging studies   CBC Recent Labs  Lab 08/02/24 0456 08/02/24 0503 08/03/24 0443  WBC 14.5*  --  15.8*  HGB 10.5* 13.9 11.4*  HCT 32.0* 41.0 34.5*  PLT 157  --  250  MCV 99.4  --  96.1  MCH 32.6  --  31.8  MCHC 32.8  --  33.0  RDW 13.6  --  13.5    Recent Labs  Lab 08/02/24 0456 08/02/24 0503 08/02/24 0649 08/03/24 0443  NA 135 136  --  136  K 4.1 4.1  --  4.3  CL 101  --   --  104  CO2 20*  --   --  23  GLUCOSE  228*  --   --  238*  BUN 22  --   --  23  CREATININE 1.32*  --   --  1.18  CALCIUM  8.7*  --   --  8.3*  AST 16  --   --   --   ALT 22  --   --   --   ALKPHOS 82  --   --   --   BILITOT 1.0  --   --   --   ALBUMIN 3.2*  --   --   --   LATICACIDVEN  --  2.3* 1.9  --   BNP 52.0  --   --   --     ------------------------------------------------------------------------------------------------------------------ No results for input(s): CHOL, HDL, LDLCALC, TRIG, CHOLHDL, LDLDIRECT in the last 72 hours.  Lab Results  Component Value Date   HGBA1C 6.1 (H) 06/12/2024   ------------------------------------------------------------------------------------------------------------------ No results for input(s): TSH, T4TOTAL, T3FREE, THYROIDAB in the last 72 hours.  Invalid input(s): FREET3  Cardiac Enzymes No results for input(s): CKMB, TROPONINI, MYOGLOBIN in the last 168 hours.  Invalid input(s): CK ------------------------------------------------------------------------------------------------------------------    Component Value Date/Time   BNP 52.0 08/02/2024 0456    CBG: Recent Labs  Lab 08/02/24 1633 08/02/24 2120 08/03/24 0547  GLUCAP 321* 208*  212*    Recent Results (from the past 240 hours)  Resp panel by RT-PCR (RSV, Flu A&B, Covid) Anterior Nasal Swab     Status: None   Collection Time: 08/02/24  5:00 AM   Specimen: Anterior Nasal Swab  Result Value Ref Range Status   SARS Coronavirus 2 by RT PCR NEGATIVE NEGATIVE Final   Influenza A by PCR NEGATIVE NEGATIVE Final   Influenza B by PCR NEGATIVE NEGATIVE Final    Comment: (NOTE) The Xpert Xpress SARS-CoV-2/FLU/RSV plus assay is intended as an aid in the diagnosis of influenza from Nasopharyngeal swab specimens and should not be used as a sole basis for treatment. Nasal washings and aspirates are unacceptable for Xpert Xpress SARS-CoV-2/FLU/RSV testing.  Fact Sheet for Patients: BloggerCourse.com  Fact Sheet for Healthcare Providers: SeriousBroker.it  This test is not yet approved or cleared by the [place]  FDA and has been authorized for detection and/or diagnosis of SARS-CoV-2 by FDA under an Emergency Use Authorization (EUA). This EUA will remain in effect (meaning this test can be used) for the duration of the COVID-19 declaration under Section 564(b)(1) of the Act, 21 U.S.C. section 360bbb-3(b)(1), unless the authorization is terminated or revoked.     Resp Syncytial Virus by PCR NEGATIVE NEGATIVE Final    Comment: (NOTE) Fact Sheet for Patients: BloggerCourse.com  Fact Sheet for Healthcare Providers: SeriousBroker.it  This test is not yet approved or cleared by the [place]  FDA and has been authorized for detection and/or diagnosis of SARS-CoV-2 by FDA under an Emergency Use Authorization (EUA). This EUA will remain in effect (meaning this test can be used) for the duration of the COVID-19 declaration under Section 564(b)(1) of the Act, 21 U.S.C. section 360bbb-3(b)(1), unless the authorization is terminated or revoked.  Performed at [facility], [address], [place], [place] [address]   Blood culture (routine x 2)     Status: Abnormal (Preliminary result)   Collection Time: 08/02/24  5:42 AM   Specimen: BLOOD LEFT ARM  Result Value Ref Range Status   Specimen Description BLOOD LEFT ARM  Final   Special Requests   Final    BOTTLES DRAWN AEROBIC AND ANAEROBIC  Blood Culture adequate volume   Culture  Setup Time   Final    GRAM NEGATIVE RODS AEROBIC BOTTLE ONLY Performed at [facility], [address] [place], [place] [address]    Culture ESCHERICHIA COLI (A)  Final   Report Status PENDING  Incomplete  Blood culture (routine x 2)     Status: Abnormal (Preliminary result)   Collection Time: 08/02/24  5:50 AM   Specimen: BLOOD RIGHT ARM  Result Value Ref Range Status   Specimen Description BLOOD RIGHT ARM  Final   Special Requests   Final    BOTTLES DRAWN AEROBIC AND ANAEROBIC Blood Culture adequate volume   Culture  Setup Time   Final    GRAM NEGATIVE RODS IN BOTH AEROBIC AND ANAEROBIC BOTTLES CRITICAL RESULT CALLED TO, READ BACK BY AND VERIFIED WITH: PHARMD K. HINDMAN [id number] @ 2013 [facility]    Culture (A)  Final    ESCHERICHIA COLI SUSCEPTIBILITIES TO FOLLOW Performed at [facility], [address] [place], [place] [address]    Report Status PENDING  Incomplete  Blood Culture ID Panel (Reflexed)     Status: Abnormal   Collection Time: 08/02/24  5:50 AM  Result Value Ref Range Status   Enterococcus faecalis NOT DETECTED NOT DETECTED Final   Enterococcus Faecium NOT DETECTED NOT DETECTED Final   Listeria monocytogenes NOT DETECTED NOT DETECTED Final   Staphylococcus species NOT DETECTED NOT DETECTED Final   Staphylococcus aureus (BCID) NOT DETECTED NOT DETECTED Final   Staphylococcus epidermidis NOT DETECTED NOT DETECTED Final   Staphylococcus lugdunensis NOT DETECTED NOT DETECTED Final   Streptococcus species NOT DETECTED NOT DETECTED Final   Streptococcus agalactiae NOT DETECTED NOT DETECTED  Final   Streptococcus pneumoniae NOT DETECTED NOT DETECTED Final   Streptococcus pyogenes NOT DETECTED NOT DETECTED Final   A.calcoaceticus-baumannii NOT DETECTED NOT DETECTED Final   Bacteroides fragilis NOT DETECTED NOT DETECTED Final   Enterobacterales DETECTED (A) NOT DETECTED Final    Comment: Enterobacterales represent a large order of gram negative bacteria, not a single organism. CRITICAL RESULT CALLED TO, READ BACK BY AND VERIFIED WITH: PHARMD K. [facility] [id number] @ 2013 [facility]    Enterobacter cloacae complex NOT DETECTED NOT DETECTED Final   Escherichia coli DETECTED (A) NOT DETECTED Final    Comment: CRITICAL RESULT CALLED TO, READ BACK BY AND VERIFIED WITH: PHARMD K. [facility] [id number] @ 2013 [facility]    Klebsiella aerogenes NOT DETECTED NOT DETECTED Final   Klebsiella oxytoca NOT DETECTED NOT DETECTED Final   Klebsiella pneumoniae NOT DETECTED NOT DETECTED Final   Proteus species NOT DETECTED NOT DETECTED Final   Salmonella species NOT DETECTED NOT DETECTED Final   Serratia marcescens NOT DETECTED NOT DETECTED Final   Haemophilus influenzae NOT DETECTED NOT DETECTED Final   Neisseria meningitidis NOT DETECTED NOT DETECTED Final   Pseudomonas aeruginosa NOT DETECTED NOT DETECTED Final   Stenotrophomonas maltophilia NOT DETECTED NOT DETECTED Final   Candida albicans NOT DETECTED NOT DETECTED Final   Candida auris NOT DETECTED NOT DETECTED Final   Candida glabrata NOT DETECTED NOT DETECTED Final   Candida krusei NOT DETECTED NOT DETECTED Final   Candida parapsilosis NOT DETECTED NOT DETECTED Final   Candida tropicalis NOT DETECTED NOT DETECTED Final   Cryptococcus neoformans/gattii NOT DETECTED NOT DETECTED Final   CTX-M ESBL NOT DETECTED NOT DETECTED Final   Carbapenem resistance IMP NOT DETECTED NOT DETECTED Final   Carbapenem resistance KPC NOT DETECTED NOT DETECTED Final   Carbapenem  resistance NDM NOT DETECTED NOT DETECTED Final   Carbapenem resist OXA 48 LIKE NOT DETECTED NOT  DETECTED Final   Carbapenem resistance VIM NOT DETECTED NOT DETECTED Final    Comment: Performed at [facility], [address], [place], [place] [address]     Radiology Studies: No results found.   Nilda Fendt, MD, PhD [facility]  Between 7 am - 7 pm I am available, please contact me via Amion (for emergencies) or Securechat (non urgent messages)  Between 7 pm - 7 am I am not available, please contact night coverage MD/APP via Amion

## 2024-08-03 NOTE — Plan of Care (Signed)
  Problem: Education: Goal: Knowledge of General Education information will improve Description: Including pain rating scale, medication(s)/side effects and non-pharmacologic comfort measures Outcome: Progressing   Problem: Health Behavior/Discharge Planning: Goal: Ability to manage health-related needs will improve Outcome: Progressing   Problem: Elimination: Goal: Will not experience complications related to bowel motility Outcome: Progressing Goal: Will not experience complications related to urinary retention Outcome: Progressing   Problem: Pain Managment: Goal: General experience of comfort will improve and/or be controlled Outcome: Progressing

## 2024-08-04 ENCOUNTER — Telehealth: Payer: Self-pay | Admitting: Acute Care

## 2024-08-04 ENCOUNTER — Other Ambulatory Visit (HOSPITAL_COMMUNITY): Payer: Self-pay

## 2024-08-04 ENCOUNTER — Encounter: Payer: Self-pay | Admitting: Emergency Medicine

## 2024-08-04 DIAGNOSIS — J189 Pneumonia, unspecified organism: Secondary | ICD-10-CM | POA: Diagnosis not present

## 2024-08-04 LAB — CULTURE, BLOOD (ROUTINE X 2): Special Requests: ADEQUATE

## 2024-08-04 LAB — COMPREHENSIVE METABOLIC PANEL WITH GFR
ALT: 21 U/L (ref 0–44)
AST: 14 U/L — ABNORMAL LOW (ref 15–41)
Albumin: 2.6 g/dL — ABNORMAL LOW (ref 3.5–5.0)
Alkaline Phosphatase: 61 U/L (ref 38–126)
Anion gap: 9 (ref 5–15)
BUN: 20 mg/dL (ref 8–23)
CO2: 26 mmol/L (ref 22–32)
Calcium: 8.6 mg/dL — ABNORMAL LOW (ref 8.9–10.3)
Chloride: 101 mmol/L (ref 98–111)
Creatinine, Ser: 1.1 mg/dL (ref 0.61–1.24)
GFR, Estimated: >60 mL/min (ref >60)
Glucose, Bld: 141 mg/dL — ABNORMAL HIGH (ref 70–99)
Potassium: 3.7 mmol/L (ref 3.5–5.1)
Sodium: 136 mmol/L (ref 135–145)
Total Bilirubin: 0.4 mg/dL (ref 0.0–1.2)
Total Protein: 5 g/dL — ABNORMAL LOW (ref 6.5–8.1)

## 2024-08-04 LAB — CBC
HCT: 35 % — ABNORMAL LOW (ref 39.0–52.0)
Hemoglobin: 11.8 g/dL — ABNORMAL LOW (ref 13.0–17.0)
MCH: 32.3 pg (ref 26.0–34.0)
MCHC: 33.7 g/dL (ref 30.0–36.0)
MCV: 95.9 fL (ref 80.0–100.0)
Platelets: 257 K/uL (ref 150–400)
RBC: 3.65 MIL/uL — ABNORMAL LOW (ref 4.22–5.81)
RDW: 13.3 % (ref 11.5–15.5)
WBC: 12.3 K/uL — ABNORMAL HIGH (ref 4.0–10.5)
nRBC: 0 % (ref 0.0–0.2)

## 2024-08-04 LAB — MAGNESIUM: Magnesium: 1.8 mg/dL (ref 1.7–2.4)

## 2024-08-04 LAB — GLUCOSE, CAPILLARY
Glucose-Capillary: 159 mg/dL — ABNORMAL HIGH (ref 70–99)
Glucose-Capillary: 198 mg/dL — ABNORMAL HIGH (ref 70–99)
Glucose-Capillary: 195 mg/dL — ABNORMAL HIGH (ref 70–99)

## 2024-08-04 MED ORDER — AMOXICILLIN-POT CLAVULANATE 875-125 MG PO TABS
1.0000 | ORAL_TABLET | Freq: Two times a day (BID) | ORAL | 0 refills | Status: AC
  Filled 2024-08-04: qty 20, 10d supply, fill #0

## 2024-08-04 MED ORDER — PREDNISONE 20 MG PO TABS
ORAL_TABLET | ORAL | 0 refills | Status: DC
  Filled 2024-08-04: qty 5, 4d supply, fill #0

## 2024-08-04 MED ORDER — BUDESON-GLYCOPYRROL-FORMOTEROL 160-9-4.8 MCG/ACT IN AERO
2.0000 | INHALATION_SPRAY | Freq: Two times a day (BID) | RESPIRATORY_TRACT | Status: DC
Start: 2024-08-04 — End: 2024-08-04
  Filled 2024-08-04: qty 5.9

## 2024-08-04 NOTE — Plan of Care (Signed)
   Problem: Education: Goal: Knowledge of General Education information will improve Description: Including pain rating scale, medication(s)/side effects and non-pharmacologic comfort measures Outcome: Progressing   Problem: Clinical Measurements: Goal: Ability to maintain clinical measurements within normal limits will improve Outcome: Progressing Goal: Will remain free from infection Outcome: Progressing

## 2024-08-04 NOTE — Discharge Summary (Signed)
 Physician Discharge Summary  Gary Wright FMW:[id number] DOB: 1945/10/24 DOA: 08/02/2024  PCP: Gary Krabbe, NP  Admit date: 08/02/2024 Discharge date: 08/04/2024  Time spent: 40 minutes  Recommendations for Outpatient Follow-up:  Follow outpatient CBC/CMP  Follow with pulmonary outpatient for COPD Complete abx for e. Coli bacteremia Needs repeat chest imaging outpatient Microscopic hematuria, needs repeat UA outpatient   Discharge Diagnoses:  Principal Problem:   CAP (community acquired pneumonia)   Discharge Condition: stable  Diet recommendation: heart healthy  Filed Weights   08/02/24 0451  Weight: 84.4 kg    History of present illness:   78 year old male with with prior tobacco use, stage IV COPD, CAD status post CABG, HTN, HLD, diabetes mellitus comes into the hospital with increasing shortness of breath. He was recently hospitalized in August with similar symptoms. He has been followed by pulmonary as an outpatient and he seems to have Gary Wright poor baseline, he frequently gets out of breath and required chronic steroids.   He was admitted with community acquired pneumonia and Gary Wright COPD exacerbation.  Found to have e. Coli bacteremia.  He's gradually improving with antibiotics, steroids, and nebs.  He'll discharge with antibiotics and Gary Wright steroid taper.    See below for additional details.  Hospital Course:  Assessment and Plan:  Sepsis due to E. coli bacteremia  Community Acquired Pneumonia Sepsis ruled in CT chest with new distal peribronchial ground glass opacity in the right upper lobe Suspect pneumonia is the source UA not overly convincing for UTI (small LE, negative nitrite, 6-10 RBC, 21-50 WBC), though possible - no urine culture sent Will complete course with augmentin    COPD Exacerbation Steroid taper, abx Continue home COPD meds Trelegy,dupixent ,chronic steroids  Microscopic Hematuria Needs repeat UA outpatient   Essential  hypertension Olmesartan  metoprolol    Type 2 diabetes mellitus, with steroid-induced hyperglycemia Continue glipizide , taper to chronic steroid dose quickly    Hyperlipidemia-continue statin   CAD with history of CABG-continue home medications, he has no chest pain   CKD 3A-baseline creatinine 1.1-1.3, currently at baseline    Procedures: none   Consultations: none  Discharge Exam: Vitals:   08/04/24 0834 08/04/24 1457  BP:  (!) 157/89  Pulse: 72 (!) 103  Resp: 18 17  Temp:  98.4 F (36.9 C)  SpO2: 99% 98%   Feeling better  Eager to go home  General: No acute distress. Cardiovascular: RRR Lungs: scattered wheezing  Neurological: Alert and oriented 3. Moves all extremities 4 with equal strength. Cranial nerves II through XII grossly intact. Extremities: No clubbing or cyanosis. No edema.  Discharge Instructions   Discharge Instructions     Call MD for:  difficulty breathing, headache or visual disturbances   Complete by: As directed    Call MD for:  extreme fatigue   Complete by: As directed    Call MD for:  hives   Complete by: As directed    Call MD for:  persistant dizziness or light-headedness   Complete by: As directed    Call MD for:  persistant nausea and vomiting   Complete by: As directed    Call MD for:  redness, tenderness, or signs of infection (pain, swelling, redness, odor or green/yellow discharge around incision site)   Complete by: As directed    Call MD for:  severe uncontrolled pain   Complete by: As directed    Call MD for:  temperature >100.4   Complete by: As directed    Diet - low sodium heart healthy  Complete by: As directed    Discharge instructions   Complete by: As directed    You were seen for pneumonia and Gary Wright COPD exacerbation.  You also had bacteremia (bacteria in the blood).  This is likely related to your pneumonia.  You've improved with antibiotics.  I'll send you home with augmentin  for another 10 days.    We'll  send you home with Gary Wright prednisone  taper for your COPD exacerbation.  Continue your medicines for your COPD and follow up with pulmonology as an outpatient.    You should have repeat chest imaging as an outpatient.   Return for new, recurrent, or worsening symptoms.  Please ask your PCP to request records from this hospitalization so they know what was done and what the next steps will be.   Increase activity slowly   Complete by: As directed       Allergies as of 08/04/2024   No Known Allergies      Medication List     TAKE these medications    acetaminophen  500 MG tablet Commonly known as: TYLENOL  Take 1,000 mg by mouth 2 (two) times daily as needed for headache or fever (pain).   albuterol  (2.5 MG/3ML) 0.083% nebulizer solution Commonly known as: PROVENTIL  Take 3 mLs (2.5 mg total) by nebulization every 4 (four) hours as needed for wheezing or shortness of breath.   albuterol  108 (90 Base) MCG/ACT inhaler Commonly known as: VENTOLIN  HFA Inhale 1-2 puffs into the lungs every 4 (four) hours as needed for wheezing or shortness of breath.   amoxicillin -clavulanate 875-125 MG tablet Commonly known as: AUGMENTIN  Take 1 tablet by mouth 2 (two) times daily for 10 days.   atorvastatin  40 MG tablet Commonly known as: LIPITOR TAKE 1 TABLET(40 MG) BY MOUTH DAILY AT 6 PM What changed: See the new instructions.   budesonide  0.5 MG/2ML nebulizer solution Commonly known as: Pulmicort  Take 2 mLs (0.5 mg total) by nebulization 2 (two) times daily.   buPROPion  300 MG 24 hr tablet Commonly known as: WELLBUTRIN  XL Take 300 mg by mouth daily.   citalopram  40 MG tablet Commonly known as: CELEXA  Take 40 mg by mouth daily.   clotrimazole -betamethasone  cream Commonly known as: LOTRISONE  Apply 1 Application topically 2 (two) times daily. Apply to buttocks and left ankle & heel. What changed:  when to take this reasons to take this additional instructions   cyclobenzaprine  5 MG  tablet Commonly known as: FLEXERIL  Take 1 tablet (5 mg total) by mouth 3 (three) times daily as needed for muscle spasms.   Dupixent  300 MG/2ML Soaj Generic drug: Dupilumab  Inject 300 mg into the skin every 14 (fourteen) days. What changed:  when to take this additional instructions   glipiZIDE  2.5 MG 24 hr tablet Commonly known as: GLUCOTROL  XL TAKE 1 TABLET(2.5 MG) BY MOUTH TWICE DAILY AFTER Amonie Wisser MEAL What changed: See the new instructions.   ipratropium-albuterol  0.5-2.5 (3) MG/3ML Soln Commonly known as: DUONEB Take 3 mLs by nebulization 4 (four) times daily.   MAGNESIUM  PO Take 1 tablet by mouth at bedtime as needed (relaxation, sleep).   MELATONIN PO Take 1 tablet by mouth at bedtime as needed (relaxation, sleep).   meloxicam  15 MG tablet Commonly known as: MOBIC  Take 15 mg by mouth daily as needed for pain.   metoprolol  succinate 25 MG 24 hr tablet Commonly known as: TOPROL -XL TAKE 1/2 TABLET(12.5 MG) BY MOUTH DAILY What changed: See the new instructions.   olmesartan  40 MG tablet Commonly known as:  BENICAR  Take 1 tablet (40 mg total) by mouth daily. What changed:  when to take this additional instructions   pantoprazole  40 MG tablet Commonly known as: PROTONIX  One tablet by mouth daily 30 to 60 minutes before the first meal of the day What changed:  how much to take how to take this when to take this reasons to take this additional instructions   predniSONE  10 MG tablet Commonly known as: DELTASONE  Take 1 tablet (10 mg total) by mouth daily with breakfast. What changed: Another medication with the same name was added. Make sure you understand how and when to take each.   predniSONE  20 MG tablet Commonly known as: DELTASONE  Take 1.5 tablets (30 mg total) by mouth daily with breakfast for 2 days, THEN 1 tablet (20 mg total) daily with breakfast for 2 days. Then resume your home prednisone  dose. Start taking on: August 04, 2024 What changed: You were  already taking Lanorris Kalisz medication with the same name, and this prescription was added. Make sure you understand how and when to take each.   Trelegy Ellipta  200-62.5-25 MCG/ACT Aepb Generic drug: Fluticasone -Umeclidin-Vilant Inhale 1 puff into the lungs every evening.   triamcinolone  ointment 0.5 % Commonly known as: KENALOG  Apply topically 2 (two) times daily. What changed:  how much to take when to take this reasons to take this       No Known Allergies    The results of significant diagnostics from this hospitalization (including imaging, microbiology, ancillary and laboratory) are listed below for reference.    Significant Diagnostic Studies: CT Angio Chest PE W and/or Wo Contrast Result Date: 08/02/2024 EXAM: CTA of the Chest with contrast for PE 08/02/2024 06:13:05 AM TECHNIQUE: CTA of the chest was performed after the administration of 75 mL of iohexol  (OMNIPAQUE ) 350 MG/ML injection. Multiplanar reformatted images are provided for review. MIP images are provided for review. Automated exposure control, iterative reconstruction, and/or weight based adjustment of the mA/kV was utilized to reduce the radiation dose to as low as reasonably achievable. COMPARISON: Portable chest x-ray 08/02/2024. CTA chest 06/12/2024. CLINICAL HISTORY: 78 year old male with suspected PE, high probability, ongoing SOB, and history of Stage IV COPD. FINDINGS: PULMONARY ARTERIES: Pulmonary arteries are adequately opacified for evaluation. Good pulmonary artery contrast timing respiratory motion, primarily in the lower lungs. No pulmonary artery filling defect. Main pulmonary artery is normal in caliber. MEDIASTINUM: The heart demonstrates no acute abnormality. Normal heart size. No pericardial effusion. Calcified coronary artery and aortic atherosclerosis. LYMPH NODES: No mediastinal, hilar or axillary lymphadenopathy. LUNGS AND PLEURA: Asymmetric centrilobular emphysema. Evidence of previous left upper lobectomy.  Patchy new distal peribronchial ground glass opacity in the right upper lobe (series 6, images 44 and 53) appears infectious/inflammatory. Major airways remain patent. Calcified granuloma in the left lung is benign (no follow up imaging recommended). No pleural effusion or pneumothorax. UPPER ABDOMEN: Small hiatal hernia has not significantly changed. Motion artifact in the upper abdomen. No upper abdominal pneumoperitoneum or free fluid. SOFT TISSUES AND BONES: Chronic left posterior rib fractures, chronic unhealed posterior left 8th rib fracture. No acute or suspicious osseous lesion. No acute soft tissue abnormality. IMPRESSION: 1. No pulmonary embolism. 2. Emphysema. Prior left upper lobectomy. Patchy new distal peribronchial ground-glass opacity in the right upper lobe, favor acute infectious exacerbation - Bronchopneumonia. Electronically signed by: Helayne Hurst MD 08/02/2024 06:38 AM EDT RP Workstation: [id number]   CT Head Wo Contrast Result Date: 08/02/2024 EXAM: CT HEAD WITHOUT CONTRAST 08/02/2024 06:13:05 AM TECHNIQUE: CT of  the head was performed without the administration of intravenous contrast. Automated exposure control, iterative reconstruction, and/or weight based adjustment of the mA/kV was utilized to reduce the radiation dose to as low as reasonably achievable. COMPARISON: Report of brain MRI 10/12/2008 (no images available). CLINICAL HISTORY: 78 year old male. Mental status change, unknown cause. AMS. FINDINGS: BRAIN AND VENTRICLES: No acute intracranial hemorrhage identified. No evidence of acute infarct. Asymmetric extra-axial fluid over both cerebral hemispheres appears to be normal subarachnoid space CSF. No convincing subdural collection. No intracranial mass effect or midline shift. Brain volume is within normal limits for age. Gray-white differentiation is within normal limits for age. Calcified atherosclerosis at the skull base. No suspicious intracranial vascular hyperdensity. No  hydrocephalus. ORBITS: Postoperative changes to both globes. SINUSES: No acute abnormality. SOFT TISSUES AND SKULL: Bulky craniocervical junction and C1-C2 bony degeneration. No acute soft tissue abnormality. No skull fracture. IMPRESSION: 1. No acute intracranial abnormality. Negative for age non-contrast CT appearance of the brain. Electronically signed by: Helayne Hurst MD 08/02/2024 06:32 AM EDT RP Workstation: [id number]   DG Chest Portable 1 View Result Date: 08/02/2024 EXAM: 1 VIEW(S) XRAY OF THE CHEST 08/02/2024 04:54:00 AM COMPARISON: Chest radiographs 07/01/2024 and earlier. CLINICAL HISTORY: 78 year old male with shortness of breath. FINDINGS: LINES, TUBES AND DEVICES: External tubing suspected, projecting over the medial left clavicle. LUNGS AND PLEURA: Lung volumes remain normal. No focal pulmonary opacity. No pulmonary edema. No pleural effusion. No pneumothorax. HEART AND MEDIASTINUM: No acute abnormality of the cardiac and mediastinal silhouettes. BONES AND SOFT TISSUES: Advanced glenohumeral degeneration. No acute osseous abnormality. IMPRESSION: 1. No acute cardiopulmonary abnormality. Electronically signed by: Helayne Hurst MD 08/02/2024 05:03 AM EDT RP Workstation: [id number]   US  LIMITED JOINT SPACE STRUCTURES LOW LEFT(NO LINKED CHARGES) Result Date: 07/16/2024 Procedure: Ultrasound Guided Hip Acetabulofemoral Joint Injection Side: Left Diagnosis: Flare of osteoarthritis US  Indication: - accuracy is paramount for diagnosis - to ensure therapeutic efficacy or procedural success - to reduce procedural risk  After explaining the procedure, viable alternatives, risks, and answering any questions, consent was given verbally. The site was cleaned with chlorhexidine  prep. An ultrasound transducer was placed on the anterior thigh/hip.   The acetabular joint, labrum, and femoral shaft were identified.  The neurovascular structures were identified and an approach was found specifically avoiding these  structures.  Tyleah Loh steroid injection was performed under ultrasound guidance with sterile technique using 2ml of 1% lidocaine  without epinephrine  and 40 mg of triamcinolone  (KENALOG ) 40mg /ml. This was well tolerated and resulted in  relief.  Needle was removed and dressing placed and post injection instructions were given including  Rukiya Hodgkins discussion of likely return of pain today after the anesthetic wears off (with the possibility of worsened pain) until the steroid starts to work in 1-3 days.   Pt was advised to call or return to clinic if these symptoms worsen or fail to improve as anticipated. Images permanently stored.    Microbiology: Recent Results (from the past 240 hours)  Resp panel by RT-PCR (RSV, Flu Reade Trefz&B, Covid) Anterior Nasal Swab     Status: None   Collection Time: 08/02/24  5:00 AM   Specimen: Anterior Nasal Swab  Result Value Ref Range Status   SARS Coronavirus 2 by RT PCR NEGATIVE NEGATIVE Final   Influenza Maegen Wigle by PCR NEGATIVE NEGATIVE Final   Influenza B by PCR NEGATIVE NEGATIVE Final    Comment: (NOTE) The Xpert Xpress SARS-CoV-2/FLU/RSV plus assay is intended as an aid in the diagnosis of influenza from  Nasopharyngeal swab specimens and should not be used as Ilyaas Musto sole basis for treatment. Nasal washings and aspirates are unacceptable for Xpert Xpress SARS-CoV-2/FLU/RSV testing.  Fact Sheet for Patients: BloggerCourse.com  Fact Sheet for Healthcare Providers: SeriousBroker.it  This test is not yet approved or cleared by the [place]  FDA and has been authorized for detection and/or diagnosis of SARS-CoV-2 by FDA under an Emergency Use Authorization (EUA). This EUA will remain in effect (meaning this test can be used) for the duration of the COVID-19 declaration under Section 564(b)(1) of the Act, 21 U.S.C. section 360bbb-3(b)(1), unless the authorization is terminated or revoked.     Resp Syncytial Virus by PCR NEGATIVE  NEGATIVE Final    Comment: (NOTE) Fact Sheet for Patients: BloggerCourse.com  Fact Sheet for Healthcare Providers: SeriousBroker.it  This test is not yet approved or cleared by the [place]  FDA and has been authorized for detection and/or diagnosis of SARS-CoV-2 by FDA under an Emergency Use Authorization (EUA). This EUA will remain in effect (meaning this test can be used) for the duration of the COVID-19 declaration under Section 564(b)(1) of the Act, 21 U.S.C. section 360bbb-3(b)(1), unless the authorization is terminated or revoked.  Performed at [facility], [address] [place], [place] [address]   Blood culture (routine x 2)     Status: Abnormal (Preliminary result)   Collection Time: 08/02/24  5:42 AM   Specimen: BLOOD LEFT ARM  Result Value Ref Range Status   Specimen Description BLOOD LEFT ARM  Final   Special Requests   Final    BOTTLES DRAWN AEROBIC AND ANAEROBIC Blood Culture adequate volume   Culture  Setup Time   Final    GRAM NEGATIVE RODS IN BOTH AEROBIC AND ANAEROBIC BOTTLES CRITICAL VALUE NOTED.  VALUE IS CONSISTENT WITH PREVIOUSLY REPORTED AND CALLED VALUE.    Culture (Kauan Kloosterman)  Final    ESCHERICHIA COLI SUSCEPTIBILITIES PERFORMED ON PREVIOUS CULTURE WITHIN THE LAST 5 DAYS. Performed at [facility], [address] [place], [place] [address]    Report Status PENDING  Incomplete  Blood culture (routine x 2)     Status: Abnormal   Collection Time: 08/02/24  5:50 AM   Specimen: BLOOD RIGHT ARM  Result Value Ref Range Status   Specimen Description BLOOD RIGHT ARM  Final   Special Requests   Final    BOTTLES DRAWN AEROBIC AND ANAEROBIC Blood Culture adequate volume   Culture  Setup Time   Final    GRAM NEGATIVE RODS IN BOTH AEROBIC AND ANAEROBIC BOTTLES CRITICAL RESULT CALLED TO, READ BACK BY AND VERIFIED WITH: PHARMD K. HINDMAN [id number] @ 2013 FH Performed at [facility], [address] [place], [place] [address]    Culture ESCHERICHIA COLI (Yakir Wenke)  Final   Report Status 08/04/2024 FINAL  Final   Organism ID, Bacteria ESCHERICHIA COLI  Final      Susceptibility   Escherichia coli - MIC*    AMPICILLIN <=2 SENSITIVE Sensitive     CEFAZOLIN  (NON-URINE) <=1 SENSITIVE Sensitive     CEFEPIME <=0.12 SENSITIVE Sensitive     ERTAPENEM <=0.12 SENSITIVE Sensitive     CEFTRIAXONE  <=0.25 SENSITIVE Sensitive     CIPROFLOXACIN <=0.06 SENSITIVE Sensitive     GENTAMICIN <=1 SENSITIVE Sensitive     MEROPENEM <=0.25 SENSITIVE Sensitive     TRIMETH/SULFA <=20 SENSITIVE Sensitive     AMPICILLIN/SULBACTAM <=2 SENSITIVE Sensitive     PIP/TAZO Value in next row Sensitive      <=  4 SENSITIVEThis is Davey Bergsma modified FDA-approved test that has been validated and its performance characteristics determined by the reporting laboratory.  This laboratory is certified under the Clinical Laboratory Improvement Amendments CLIA as qualified to perform high complexity clinical laboratory testing.    * ESCHERICHIA COLI  Blood Culture ID Panel (Reflexed)     Status: Abnormal   Collection Time: 08/02/24  5:50 AM  Result Value Ref Range Status   Enterococcus faecalis NOT DETECTED NOT DETECTED Final   Enterococcus Faecium NOT DETECTED NOT DETECTED Final   Listeria monocytogenes NOT DETECTED NOT DETECTED Final   Staphylococcus species NOT DETECTED NOT DETECTED Final   Staphylococcus aureus (BCID) NOT DETECTED NOT DETECTED Final   Staphylococcus epidermidis NOT DETECTED NOT DETECTED Final   Staphylococcus lugdunensis NOT DETECTED NOT DETECTED Final   Streptococcus species NOT DETECTED NOT DETECTED Final   Streptococcus agalactiae NOT DETECTED NOT DETECTED Final   Streptococcus pneumoniae NOT DETECTED NOT DETECTED Final   Streptococcus pyogenes NOT DETECTED NOT DETECTED Final   Edon Hoadley.calcoaceticus-baumannii NOT DETECTED NOT DETECTED Final   Bacteroides fragilis NOT DETECTED NOT DETECTED Final   Enterobacterales  DETECTED (Darleny Sem) NOT DETECTED Final    Comment: Enterobacterales represent Melvine Julin large order of gram negative bacteria, not Rasaan Brotherton single organism. CRITICAL RESULT CALLED TO, READ BACK BY AND VERIFIED WITH: PHARMD K. [facility] [id number] @ 2013 FH    Enterobacter cloacae complex NOT DETECTED NOT DETECTED Final   Escherichia coli DETECTED (Travares Nelles) NOT DETECTED Final    Comment: CRITICAL RESULT CALLED TO, READ BACK BY AND VERIFIED WITH: PHARMD K. [facility] [id number] @ 2013 FH    Klebsiella aerogenes NOT DETECTED NOT DETECTED Final   Klebsiella oxytoca NOT DETECTED NOT DETECTED Final   Klebsiella pneumoniae NOT DETECTED NOT DETECTED Final   Proteus species NOT DETECTED NOT DETECTED Final   Salmonella species NOT DETECTED NOT DETECTED Final   Serratia marcescens NOT DETECTED NOT DETECTED Final   Haemophilus influenzae NOT DETECTED NOT DETECTED Final   Neisseria meningitidis NOT DETECTED NOT DETECTED Final   Pseudomonas aeruginosa NOT DETECTED NOT DETECTED Final   Stenotrophomonas maltophilia NOT DETECTED NOT DETECTED Final   Candida albicans NOT DETECTED NOT DETECTED Final   Candida auris NOT DETECTED NOT DETECTED Final   Candida glabrata NOT DETECTED NOT DETECTED Final   Candida krusei NOT DETECTED NOT DETECTED Final   Candida parapsilosis NOT DETECTED NOT DETECTED Final   Candida tropicalis NOT DETECTED NOT DETECTED Final   Cryptococcus neoformans/gattii NOT DETECTED NOT DETECTED Final   CTX-M ESBL NOT DETECTED NOT DETECTED Final   Carbapenem resistance IMP NOT DETECTED NOT DETECTED Final   Carbapenem resistance KPC NOT DETECTED NOT DETECTED Final   Carbapenem resistance NDM NOT DETECTED NOT DETECTED Final   Carbapenem resist OXA 48 LIKE NOT DETECTED NOT DETECTED Final   Carbapenem resistance VIM NOT DETECTED NOT DETECTED Final    Comment: Performed at [facility], [address], [place], [place] [address]     Labs: Basic Metabolic Panel: Recent Labs  Lab 08/02/24 0456 08/02/24 0503 08/03/24 0443  08/04/24 0451  NA 135 136 136 136  K 4.1 4.1 4.3 3.7  CL 101  --  104 101  CO2 20*  --  23 26  GLUCOSE 228*  --  238* 141*  BUN 22  --  23 20  CREATININE 1.32*  --  1.18 1.10  CALCIUM  8.7*  --  8.3* 8.6*  MG  --   --   --  1.8  Liver Function Tests: Recent Labs  Lab 08/02/24 0456 08/04/24 0451  AST 16 14*  ALT 22 21  ALKPHOS 82 61  BILITOT 1.0 0.4  PROT 5.9* 5.0*  ALBUMIN 3.2* 2.6*   Recent Labs  Lab 08/02/24 0456  LIPASE 19   No results for input(s): AMMONIA in the last 168 hours. CBC: Recent Labs  Lab 08/02/24 0456 08/02/24 0503 08/03/24 0443 08/04/24 0451  WBC 14.5*  --  15.8* 12.3*  HGB 10.5* 13.9 11.4* 11.8*  HCT 32.0* 41.0 34.5* 35.0*  MCV 99.4  --  96.1 95.9  PLT 157  --  250 257   Cardiac Enzymes: No results for input(s): CKTOTAL, CKMB, CKMBINDEX, TROPONINI in the last 168 hours. BNP: BNP (last 3 results) Recent Labs    06/12/24 1620 08/02/24 0456  BNP 224.1* 52.0    ProBNP (last 3 results) No results for input(s): PROBNP in the last 8760 hours.  CBG: Recent Labs  Lab 08/03/24 1517 08/03/24 2110 08/04/24 0610 08/04/24 1300 08/04/24 1618  GLUCAP 218* 197* 159* 198* 195*       Signed:  Meliton Monte MD.  [facility] 08/04/2024, 5:04 PM

## 2024-08-04 NOTE — Evaluation (Signed)
 Physical Therapy Evaluation & Discharge Patient Details Name: Gary Wright MRN: [id number] DOB: 25-Sep-1946 Today's Date: 08/04/2024  History of Present Illness  78 y.o. male presents 08/02/24 with SOB. Admitted with acute on chronic COPD exacerbation, PNA, and sepsis due to E. Coli bacteremia. PMHx: COPD, CAD, CABG, HTN, DMT2, asthma, HLD  Clinical Impression  Pt presents with condition above. Pt appears and reports to be functioning at his baseline, ambulating around the room without UE support but using the RW for longer distance gait bouts in the hall without LOB or need for assistance. Pt able to navigate stairs without assistance or LOB either provided he used the rails for support. Educated pt on mobilizing frequently and on IS use. Provided pt with IS. Pt verbalized and demonstrated understanding. No further skilled PT services needed at this time. All education completed and questions answered. PT will sign off. Thank you for this referral.      If plan is discharge home, recommend the following: Assistance with cooking/housework   Can travel by private vehicle        Equipment Recommendations None recommended by PT  Recommendations for Other Services       Functional Status Assessment Patient has not had a recent decline in their functional status     Precautions / Restrictions Precautions Precautions: Fall (low risk) Restrictions Weight Bearing Restrictions Per Provider Order: No      Mobility  Bed Mobility               General bed mobility comments: Pt standing in room upon arrival    Transfers Overall transfer level: Modified independent Equipment used: Rolling walker (2 wheels)               General transfer comment: Pt able to transfer sit <> stand from recliner to RW without LOB or assistance    Ambulation/Gait Ambulation/Gait assistance: Modified independent (Device/Increase time) Gait Distance (Feet): 200 Feet Assistive device: Rolling  walker (2 wheels), None Gait Pattern/deviations: Step-through pattern, Decreased stride length, Trunk flexed Gait velocity: reduced Gait velocity interpretation: <1.8 ft/sec, indicate of risk for recurrent falls   General Gait Details: Pt ambulating without UE support around room upon arrival. Pt used RW for longer distances in halls. No LOB  Stairs Stairs: Yes Stairs assistance: Supervision Stair Management: Two rails, Alternating pattern, Step to pattern, Forwards Number of Stairs: 4 General stair comments: Ascends with alternating step pattern, no LOB. Descended the first x2 with step-to pattern and the final x2 with alternating step pattern. No LOB, supervision for safety  Wheelchair Mobility     Tilt Bed    Modified Rankin (Stroke Patients Only)       Balance Overall balance assessment: Mild deficits observed, not formally tested                                           Pertinent Vitals/Pain Pain Assessment Pain Assessment: Faces Faces Pain Scale: Hurts a little bit Pain Location: chronic back pain Pain Descriptors / Indicators: Discomfort, Guarding Pain Intervention(s): Limited activity within patient's tolerance, Monitored during session, Repositioned    Home Living Family/patient expects to be discharged to:: Private residence Living Arrangements: Spouse/significant other Available Help at Discharge: Family;Available 24 hours/day Type of Home: House Home Access: Stairs to enter Entrance Stairs-Rails: Right Entrance Stairs-Number of Steps: 4   Home Layout: One level Home Equipment: Rollator (4  wheels);Cane - single point;Shower seat      Prior Function Prior Level of Function : Independent/Modified Independent             Mobility Comments: Uses rollator vs SPC       Extremity/Trunk Assessment   Upper Extremity Assessment Upper Extremity Assessment: Overall WFL for tasks assessed    Lower Extremity Assessment Lower  Extremity Assessment: Overall WFL for tasks assessed    Cervical / Trunk Assessment Cervical / Trunk Assessment: Kyphotic  Communication   Communication Communication: No apparent difficulties    Cognition Arousal: Alert Behavior During Therapy: WFL for tasks assessed/performed   PT - Cognitive impairments: No apparent impairments                       PT - Cognition Comments: tangential at times, needing redirecting, likely baseline Following commands: Intact       Cueing Cueing Techniques: Verbal cues     General Comments General comments (skin integrity, edema, etc.): Educated pt on mobilizing frequently and on IS use. Provided pt with IS. Pt verbalized and demonstrated understanding. VSS on RA    Exercises     Assessment/Plan    PT Assessment Patient does not need any further PT services  PT Problem List         PT Treatment Interventions      PT Goals (Current goals can be found in the Care Plan section)  Acute Rehab PT Goals Patient Stated Goal: to go home PT Goal Formulation: All assessment and education complete, DC therapy Time For Goal Achievement: 08/05/24 Potential to Achieve Goals: Good    Frequency       Co-evaluation               AM-PAC PT 6 Clicks Mobility  Outcome Measure Help needed turning from your back to your side while in a flat bed without using bedrails?: None Help needed moving from lying on your back to sitting on the side of a flat bed without using bedrails?: None Help needed moving to and from a bed to a chair (including a wheelchair)?: None Help needed standing up from a chair using your arms (e.g., wheelchair or bedside chair)?: None Help needed to walk in hospital room?: None Help needed climbing 3-5 steps with a railing? : A Little 6 Click Score: 23    End of Session   Activity Tolerance: Patient tolerated treatment well Patient left: in chair;with call bell/phone within reach   PT Visit Diagnosis:  Other abnormalities of gait and mobility (R26.89);Unsteadiness on feet (R26.81)    Time: 8586-8573 PT Time Calculation (min) (ACUTE ONLY): 13 min   Charges:   PT Evaluation $PT Eval Low Complexity: 1 Low   PT General Charges $$ ACUTE PT VISIT: 1 Visit         Theo Ferretti, PT, DPT Acute Rehabilitation Services  Office: [phone]   Theo CHRISTELLA Ferretti 08/04/2024, 2:37 PM

## 2024-08-04 NOTE — Telephone Encounter (Signed)
 Patient is due for his annual LDCT. However patient just had a CT angio on 08/02/2024 that didn't reveal any significant or suspicious nodules. Patient also turns 78yo on 08/14/2024 making patient ineligible for LCS through medicaid and medicare. Called patient to discuss this but had to leave a VM. Will also send a letter to patient notifying him.

## 2024-08-04 NOTE — Progress Notes (Signed)
 SATURATION QUALIFICATIONS: (This note is used to comply with regulatory documentation for home oxygen)  Patient Saturations on Room Air at Rest = 95%  Patient Saturations on Room Air while Ambulating = 92%   Please briefly explain why patient needs home oxygen: Pt is not requiring supplemental O2.   Theo Ferretti, PT, DPT Acute Rehabilitation Services  Office: [phone]

## 2024-08-04 NOTE — Progress Notes (Signed)
 [name] to be D/C'd Home per MD order.  Discussed with the patient and all questions fully answered.  VSS, Skin clean, dry and intact without evidence of skin break down, no evidence of skin tears noted. IV catheter discontinued intact. Site without signs and symptoms of complications. Dressing and pressure applied.  An After Visit Summary was printed and given to the patient. Patient received prescriptions from [facility] pharmacy and medication stored in pharmacy returned to patient.  D/c education completed with patient/family including follow up instructions, medication list, d/c activities limitations if indicated, with other d/c instructions as indicated by MD - patient able to verbalize understanding, all questions fully answered.   Patient instructed to return to ED, call 911, or call MD for any changes in condition.   Patient escorted via WC, and D/C home via private auto.  Anique Beckley L Tannia Contino 08/04/2024 6:18 PM

## 2024-08-05 ENCOUNTER — Observation Stay (HOSPITAL_COMMUNITY)
Admission: EM | Admit: 2024-08-05 | Discharge: 2024-08-06 | Disposition: A | Discharge status: Home / Self Care | Attending: Internal Medicine | Admitting: Internal Medicine

## 2024-08-05 ENCOUNTER — Telehealth: Payer: Self-pay

## 2024-08-05 ENCOUNTER — Ambulatory Visit (HOSPITAL_COMMUNITY)

## 2024-08-05 ENCOUNTER — Encounter (HOSPITAL_COMMUNITY): Payer: Self-pay | Admitting: Internal Medicine

## 2024-08-05 ENCOUNTER — Emergency Department (HOSPITAL_COMMUNITY)

## 2024-08-05 ENCOUNTER — Other Ambulatory Visit: Payer: Self-pay

## 2024-08-05 DIAGNOSIS — E785 Hyperlipidemia, unspecified: Secondary | ICD-10-CM | POA: Diagnosis not present

## 2024-08-05 DIAGNOSIS — Z79899 Other long term (current) drug therapy: Secondary | ICD-10-CM | POA: Diagnosis not present

## 2024-08-05 DIAGNOSIS — Z951 Presence of aortocoronary bypass graft: Secondary | ICD-10-CM | POA: Insufficient documentation

## 2024-08-05 DIAGNOSIS — I129 Hypertensive chronic kidney disease with stage 1 through stage 4 chronic kidney disease, or unspecified chronic kidney disease: Secondary | ICD-10-CM | POA: Diagnosis not present

## 2024-08-05 DIAGNOSIS — F32A Depression, unspecified: Secondary | ICD-10-CM | POA: Diagnosis not present

## 2024-08-05 DIAGNOSIS — Z87898 Personal history of other specified conditions: Secondary | ICD-10-CM

## 2024-08-05 DIAGNOSIS — I10 Essential (primary) hypertension: Secondary | ICD-10-CM | POA: Diagnosis present

## 2024-08-05 DIAGNOSIS — I2510 Atherosclerotic heart disease of native coronary artery without angina pectoris: Secondary | ICD-10-CM | POA: Diagnosis not present

## 2024-08-05 DIAGNOSIS — Z85118 Personal history of other malignant neoplasm of bronchus and lung: Secondary | ICD-10-CM | POA: Diagnosis not present

## 2024-08-05 DIAGNOSIS — D72829 Elevated white blood cell count, unspecified: Secondary | ICD-10-CM | POA: Diagnosis not present

## 2024-08-05 DIAGNOSIS — Z8619 Personal history of other infectious and parasitic diseases: Secondary | ICD-10-CM | POA: Insufficient documentation

## 2024-08-05 DIAGNOSIS — E1122 Type 2 diabetes mellitus with diabetic chronic kidney disease: Secondary | ICD-10-CM | POA: Insufficient documentation

## 2024-08-05 DIAGNOSIS — N1832 Chronic kidney disease, stage 3b: Secondary | ICD-10-CM | POA: Diagnosis not present

## 2024-08-05 DIAGNOSIS — E1165 Type 2 diabetes mellitus with hyperglycemia: Secondary | ICD-10-CM | POA: Diagnosis not present

## 2024-08-05 DIAGNOSIS — R0602 Shortness of breath: Secondary | ICD-10-CM | POA: Diagnosis present

## 2024-08-05 DIAGNOSIS — J9601 Acute respiratory failure with hypoxia: Secondary | ICD-10-CM | POA: Diagnosis not present

## 2024-08-05 DIAGNOSIS — N179 Acute kidney failure, unspecified: Secondary | ICD-10-CM | POA: Diagnosis not present

## 2024-08-05 DIAGNOSIS — J45909 Unspecified asthma, uncomplicated: Secondary | ICD-10-CM | POA: Insufficient documentation

## 2024-08-05 DIAGNOSIS — J441 Chronic obstructive pulmonary disease with (acute) exacerbation: Secondary | ICD-10-CM | POA: Diagnosis present

## 2024-08-05 DIAGNOSIS — E099 Drug or chemical induced diabetes mellitus without complications: Secondary | ICD-10-CM | POA: Diagnosis present

## 2024-08-05 DIAGNOSIS — J4489 Other specified chronic obstructive pulmonary disease: Secondary | ICD-10-CM

## 2024-08-05 DIAGNOSIS — F341 Dysthymic disorder: Secondary | ICD-10-CM

## 2024-08-05 DIAGNOSIS — Z87891 Personal history of nicotine dependence: Secondary | ICD-10-CM | POA: Insufficient documentation

## 2024-08-05 HISTORY — DX: Elevated white blood cell count, unspecified: D72.829

## 2024-08-05 LAB — TROPONIN I (HIGH SENSITIVITY)
Troponin I (High Sensitivity): 12 ng/L (ref <18)
Troponin I (High Sensitivity): 12 ng/L (ref <18)

## 2024-08-05 LAB — CBC WITH DIFFERENTIAL/PLATELET
Abs Immature Granulocytes: 0.08 K/uL — ABNORMAL HIGH (ref 0.00–0.07)
Basophils Absolute: 0 K/uL (ref 0.0–0.1)
Basophils Relative: 0 %
Eosinophils Absolute: 0 K/uL (ref 0.0–0.5)
Eosinophils Relative: 0 %
HCT: 42.3 % (ref 39.0–52.0)
Hemoglobin: 13.7 g/dL (ref 13.0–17.0)
Immature Granulocytes: 1 %
Lymphocytes Relative: 13 %
Lymphs Abs: 1.9 K/uL (ref 0.7–4.0)
MCH: 31.6 pg (ref 26.0–34.0)
MCHC: 32.4 g/dL (ref 30.0–36.0)
MCV: 97.5 fL (ref 80.0–100.0)
Monocytes Absolute: 0.6 K/uL (ref 0.1–1.0)
Monocytes Relative: 4 %
Neutro Abs: 11.3 K/uL — ABNORMAL HIGH (ref 1.7–7.7)
Neutrophils Relative %: 82 %
Platelets: 315 K/uL (ref 150–400)
RBC: 4.34 MIL/uL (ref 4.22–5.81)
RDW: 13.4 % (ref 11.5–15.5)
WBC: 13.9 K/uL — ABNORMAL HIGH (ref 4.0–10.5)
nRBC: 0 % (ref 0.0–0.2)

## 2024-08-05 LAB — I-STAT VENOUS BLOOD GAS, ED
Acid-Base Excess: 4 mmol/L — ABNORMAL HIGH (ref 0.0–2.0)
Bicarbonate: 28.6 mmol/L — ABNORMAL HIGH (ref 20.0–28.0)
Calcium, Ion: 1.1 mmol/L — ABNORMAL LOW (ref 1.15–1.40)
HCT: 41 % (ref 39.0–52.0)
Hemoglobin: 13.9 g/dL (ref 13.0–17.0)
O2 Saturation: 44 %
Potassium: 4.5 mmol/L (ref 3.5–5.1)
Sodium: 136 mmol/L (ref 135–145)
TCO2: 30 mmol/L (ref 22–32)
pCO2, Ven: 41.6 mmHg — ABNORMAL LOW (ref 44–60)
pH, Ven: 7.446 — ABNORMAL HIGH (ref 7.25–7.43)
pO2, Ven: 24 mmHg — CL (ref 32–45)

## 2024-08-05 LAB — BRAIN NATRIURETIC PEPTIDE: B Natriuretic Peptide: 154.7 pg/mL — ABNORMAL HIGH (ref 0.0–100.0)

## 2024-08-05 LAB — RESP PANEL BY RT-PCR (RSV, FLU A&B, COVID)  RVPGX2
Influenza A by PCR: NEGATIVE
Influenza B by PCR: NEGATIVE
Resp Syncytial Virus by PCR: NEGATIVE
SARS Coronavirus 2 by RT PCR: NEGATIVE

## 2024-08-05 LAB — BASIC METABOLIC PANEL WITH GFR
Anion gap: 12 (ref 5–15)
BUN: 26 mg/dL — ABNORMAL HIGH (ref 8–23)
CO2: 24 mmol/L (ref 22–32)
Calcium: 8.7 mg/dL — ABNORMAL LOW (ref 8.9–10.3)
Chloride: 101 mmol/L (ref 98–111)
Creatinine, Ser: 1.39 mg/dL — ABNORMAL HIGH (ref 0.61–1.24)
GFR, Estimated: 52 mL/min — ABNORMAL LOW (ref >60)
Glucose, Bld: 199 mg/dL — ABNORMAL HIGH (ref 70–99)
Potassium: 4.6 mmol/L (ref 3.5–5.1)
Sodium: 137 mmol/L (ref 135–145)

## 2024-08-05 LAB — CULTURE, BLOOD (ROUTINE X 2): Special Requests: ADEQUATE

## 2024-08-05 LAB — CBG MONITORING, ED: Glucose-Capillary: 296 mg/dL — ABNORMAL HIGH (ref 70–99)

## 2024-08-05 LAB — PROCALCITONIN: Procalcitonin: 0.5 ng/mL

## 2024-08-05 LAB — GLUCOSE, CAPILLARY
Glucose-Capillary: 235 mg/dL — ABNORMAL HIGH (ref 70–99)
Glucose-Capillary: 164 mg/dL — ABNORMAL HIGH (ref 70–99)

## 2024-08-05 MED ORDER — AMOXICILLIN-POT CLAVULANATE 875-125 MG PO TABS
1.0000 | ORAL_TABLET | Freq: Two times a day (BID) | ORAL | Status: DC
  Administered 2024-08-05 – 2024-08-06 (×2): 1 via ORAL
  Filled 2024-08-05 (×2): qty 1

## 2024-08-05 MED ORDER — ARFORMOTEROL TARTRATE 15 MCG/2ML IN NEBU
15.0000 ug | INHALATION_SOLUTION | Freq: Two times a day (BID) | RESPIRATORY_TRACT | Status: DC
  Administered 2024-08-05 – 2024-08-06 (×3): 15 ug via RESPIRATORY_TRACT
  Filled 2024-08-05 (×3): qty 2

## 2024-08-05 MED ORDER — ENOXAPARIN SODIUM 40 MG/0.4ML IJ SOSY
40.0000 mg | PREFILLED_SYRINGE | INTRAMUSCULAR | Status: DC
  Administered 2024-08-05 – 2024-08-06 (×2): 40 mg via SUBCUTANEOUS
  Filled 2024-08-05 (×2): qty 0.4

## 2024-08-05 MED ORDER — ACETAMINOPHEN 650 MG RE SUPP: 650.0000 mg | Freq: Four times a day (QID) | RECTAL | Status: DC | PRN

## 2024-08-05 MED ORDER — SODIUM CHLORIDE 0.9 % IV BOLUS
1000.0000 mL | Freq: Once | INTRAVENOUS | Status: AC
  Administered 2024-08-05: 1000 mL via INTRAVENOUS

## 2024-08-05 MED ORDER — ACETAMINOPHEN 500 MG PO TABS
1000.0000 mg | ORAL_TABLET | Freq: Once | ORAL | Status: AC
  Administered 2024-08-05: 1000 mg via ORAL
  Filled 2024-08-05: qty 2

## 2024-08-05 MED ORDER — SODIUM CHLORIDE 0.9% FLUSH
3.0000 mL | Freq: Two times a day (BID) | INTRAVENOUS | Status: DC
  Administered 2024-08-05 – 2024-08-06 (×3): 3 mL via INTRAVENOUS

## 2024-08-05 MED ORDER — GUAIFENESIN ER 600 MG PO TB12
600.0000 mg | ORAL_TABLET | Freq: Two times a day (BID) | ORAL | Status: DC
  Administered 2024-08-05 – 2024-08-06 (×2): 600 mg via ORAL
  Filled 2024-08-05 (×2): qty 1

## 2024-08-05 MED ORDER — BUDESONIDE 0.5 MG/2ML IN SUSP
0.5000 mg | Freq: Two times a day (BID) | RESPIRATORY_TRACT | Status: DC
  Administered 2024-08-05 – 2024-08-06 (×3): 0.5 mg via RESPIRATORY_TRACT
  Filled 2024-08-05 (×3): qty 2

## 2024-08-05 MED ORDER — ONDANSETRON HCL 4 MG/2ML IJ SOLN: 4.0000 mg | Freq: Four times a day (QID) | INTRAMUSCULAR | Status: DC | PRN

## 2024-08-05 MED ORDER — PREDNISONE 20 MG PO TABS
40.0000 mg | ORAL_TABLET | Freq: Every day | ORAL | Status: DC
  Administered 2024-08-06: 40 mg via ORAL
  Filled 2024-08-05: qty 2

## 2024-08-05 MED ORDER — CITALOPRAM HYDROBROMIDE 10 MG PO TABS
40.0000 mg | ORAL_TABLET | Freq: Every day | ORAL | Status: DC
  Administered 2024-08-05 – 2024-08-06 (×2): 40 mg via ORAL
  Filled 2024-08-05 (×2): qty 4

## 2024-08-05 MED ORDER — IRBESARTAN 300 MG PO TABS
300.0000 mg | ORAL_TABLET | Freq: Every day | ORAL | Status: DC
  Administered 2024-08-05 – 2024-08-06 (×2): 300 mg via ORAL
  Filled 2024-08-05 (×2): qty 1

## 2024-08-05 MED ORDER — IPRATROPIUM-ALBUTEROL 0.5-2.5 (3) MG/3ML IN SOLN
3.0000 mL | Freq: Four times a day (QID) | RESPIRATORY_TRACT | Status: DC
  Administered 2024-08-05 – 2024-08-06 (×4): 3 mL via RESPIRATORY_TRACT
  Filled 2024-08-05 (×5): qty 3

## 2024-08-05 MED ORDER — ALBUTEROL SULFATE (2.5 MG/3ML) 0.083% IN NEBU: 2.5000 mg | INHALATION_SOLUTION | RESPIRATORY_TRACT | Status: DC | PRN

## 2024-08-05 MED ORDER — METOPROLOL SUCCINATE ER 25 MG PO TB24
12.5000 mg | ORAL_TABLET | Freq: Every day | ORAL | Status: DC
  Administered 2024-08-05 – 2024-08-06 (×2): 12.5 mg via ORAL
  Filled 2024-08-05 (×2): qty 1

## 2024-08-05 MED ORDER — BUPROPION HCL ER (XL) 150 MG PO TB24
300.0000 mg | ORAL_TABLET | Freq: Every day | ORAL | Status: DC
  Administered 2024-08-05 – 2024-08-06 (×2): 300 mg via ORAL
  Filled 2024-08-05 (×2): qty 2

## 2024-08-05 MED ORDER — INSULIN ASPART 100 UNIT/ML IJ SOLN
0.0000 [IU] | Freq: Three times a day (TID) | INTRAMUSCULAR | Status: DC
  Administered 2024-08-05: 5 [IU] via SUBCUTANEOUS
  Administered 2024-08-05: 8 [IU] via SUBCUTANEOUS
  Administered 2024-08-06: 5 [IU] via SUBCUTANEOUS
  Administered 2024-08-06: 3 [IU] via SUBCUTANEOUS

## 2024-08-05 MED ORDER — HYDROCODONE-ACETAMINOPHEN 5-325 MG PO TABS
1.0000 | ORAL_TABLET | Freq: Four times a day (QID) | ORAL | Status: DC | PRN
  Administered 2024-08-05 – 2024-08-06 (×4): 1 via ORAL
  Filled 2024-08-05 (×4): qty 1

## 2024-08-05 MED ORDER — METHYLPREDNISOLONE SODIUM SUCC 40 MG IJ SOLR
40.0000 mg | Freq: Once | INTRAMUSCULAR | Status: AC
  Administered 2024-08-05: 40 mg via INTRAVENOUS
  Filled 2024-08-05: qty 1

## 2024-08-05 MED ORDER — ONDANSETRON HCL 4 MG PO TABS: 4.0000 mg | ORAL_TABLET | Freq: Four times a day (QID) | ORAL | Status: DC | PRN

## 2024-08-05 MED ORDER — ACETAMINOPHEN 325 MG PO TABS: 650.0000 mg | ORAL_TABLET | Freq: Four times a day (QID) | ORAL | Status: DC | PRN

## 2024-08-05 MED ORDER — MORPHINE SULFATE (PF) 4 MG/ML IV SOLN
4.0000 mg | Freq: Once | INTRAVENOUS | Status: AC
  Administered 2024-08-05: 4 mg via INTRAVENOUS
  Filled 2024-08-05: qty 1

## 2024-08-05 MED ORDER — ATORVASTATIN CALCIUM 40 MG PO TABS
40.0000 mg | ORAL_TABLET | Freq: Every evening | ORAL | Status: DC
  Administered 2024-08-05: 40 mg via ORAL
  Filled 2024-08-05: qty 1

## 2024-08-05 NOTE — Telephone Encounter (Signed)
 LVM. Called patient to schedule LCS. If patient is well he may be schedule prior to 78th birthday. If he is still being treated he will not qualify for the LCS.

## 2024-08-05 NOTE — H&P (Signed)
 History and Physical    Patient: Gary Wright FMW:[id number] DOB: 1946-06-14 DOA: 08/05/2024 DOS: the patient was seen and examined on 08/05/2024 PCP: Lucius Krabbe, NP  Patient coming from: Home via EMS  Chief Complaint:  Chief Complaint  Patient presents with   Respiratory Distress   HPI: Gary Wright is a 78 y.o. male with medical history significant of prior tobacco use, stage IV COPD, CAD status post CABG, HTN, HLD, diabetes mellitus comes back to the hospital due to shortness of breath   Was recently hospitalized from 10/13-10/15 after coming in with shortness of breath found to have sepsis secondary to E. coli bacteremia with community-acquired pneumonia.  He experienced another breathing attack after being discharged from the hospital yesterday. He was supposed to start antibiotics this morning but had difficulty breathing after getting up to go to the bathroom, with his oxygen levels dropping significantly.  He has a history of stage 4 COPD and has been hospitalized multiple times due to exacerbations, with five serious episodes requiring hospital stays. He was on a daily dose of steroids and prednisone  during his previous hospital visit, which did not make a significant difference. He is currently on a tapered steroid regimen.  He has been using a nebulizer four times a day and another medication twice a day for his breathing issues. Despite these treatments, he feels his condition has been deteriorating.  He felt hot, but the nurse adjusted the room temperature, which helped him feel cooler.  He has a history of smoking but quit years ago after being diagnosed with cancer, which led to the removal of his upper left lung lobe. He has not had a recurrence of cancer.  He also has diabetes, which was managed by his primary care physician. His blood sugar levels increased during his hospital stay, requiring insulin , likely due to steroid use.  He is coughing but not  excessively. Upon EMS arrival O2 saturations noted to be 84% on room air.  Patient was given 3 DuoNeb breathing treatments, Solu-Medrol  125 mg IV, and magnesium  sulfate 2 g IV during transport.  In the emergency department patient was noted to be afebrile with heart rates elevated into the 130s, respirations 16-35, and O2 saturations currently maintained on 2 L nasal cannula oxygen.  Labs significant for WBC 13.9, BUN 26, creatinine 1.39, glucose 199, BNP 154.7, and high-sensitivity troponin 12.  Venous blood gas was obtained which noted pH of 7.446, pCO2 41.6, pO2 24.  Influenza, COVID-19, and RSV screening were negative.  Chest x-ray noted chronic interstitial coarsening without acute pulmonary findings.  Patient was given 1 L of IV fluids, 1000 mg of Tylenol , and 4 mg of morphine .  Review of Systems: As mentioned in the history of present illness. All other systems reviewed and are negative. Past Medical History:  Diagnosis Date   Acute dyspnea 03/24/2022   Acute exacerbation of chronic obstructive pulmonary disease (COPD) (HCC) 03/25/2022   Asthma    CAD (coronary artery disease) of artery bypass graft 06/13/2016   Chest pain    COPD (chronic obstructive pulmonary disease) (HCC)    COPD with acute exacerbation (HCC) 11/02/2020   Quit smokig 2019  - PFT's  121/22/23  FEV1 1.83 (60 % ) ratio 0.57  p 15 % improvement from saba p 0 prior to study with DLCO  16.4 (65%)   and FV curve mildly concave  - try off acei and on otc gerd rx 06/02/2023 >>>       Depression 09/01/2007  Qualifier: Diagnosis of  By: Nickola CMA, [place]     Elevated troponin 06/13/2016   History of lung cancer 11/02/2020   Hyperlipidemia 11/02/2020   Hypertension    Prolonged QT interval 11/02/2020   Stress-induced cardiomyopathy 06/13/2016   TOBACCO ABUSE 09/01/2007   Qualifier: Diagnosis of   By: Nickola CMA, [place]         Past Surgical History:  Procedure Laterality Date   BACK SURGERY     fusion in 1973    CARDIAC CATHETERIZATION N/A 06/13/2016   Procedure: Left Heart Cath and Coronary Angiography;  Surgeon: Lonni Hanson, MD;  Location: [facility] INVASIVE CV LAB;  Service: Cardiovascular;  Laterality: N/A;   INGUINAL HERNIA REPAIR Right 04/21/2018   Procedure: RIGHT INGUINAL HERNIA REPAIR ERAS PATHWAY;  Surgeon: Vanderbilt Ned, MD;  Location: [facility] OR;  Service: General;  Laterality: Right;  TAP BLOCK   INSERTION OF MESH Right 04/21/2018   Procedure: INSERTION OF MESH;  Surgeon: Vanderbilt Ned, MD;  Location: [facility] OR;  Service: General;  Laterality: Right;   LUNG REMOVAL, PARTIAL Left    left upper lung removed about 6-7 years ago reported by pt   TONSILLECTOMY     Social History:  reports that he quit smoking about 6 years ago. His smoking use included cigarettes. He has never used smokeless tobacco. He reports that he does not drink alcohol and does not use drugs.  No Known Allergies  Family History  Problem Relation Age of Onset   Hypertension Mother    Hypertension Father     Prior to Admission medications   Medication Sig Start Date End Date Taking? Authorizing Provider  acetaminophen  (TYLENOL ) 500 MG tablet Take 1,000 mg by mouth 2 (two) times daily as needed for headache or fever (pain).    [provider]  albuterol  (PROVENTIL ) (2.5 MG/3ML) 0.083% nebulizer solution Take 3 mLs (2.5 mg total) by nebulization every 4 (four) hours as needed for wheezing or shortness of breath. 06/09/24   [name] Dorn NOVAK, MD  albuterol  (VENTOLIN  HFA) 108 (90 Base) MCG/ACT inhaler Inhale 1-2 puffs into the lungs every 4 (four) hours as needed for wheezing or shortness of breath. 06/09/24   [name] Dorn NOVAK, MD  amoxicillin -clavulanate (AUGMENTIN ) 875-125 MG tablet Take 1 tablet by mouth 2 (two) times daily for 10 days. 08/04/24 08/14/24  Perri DELENA Meliton Mickey., MD  atorvastatin  (LIPITOR) 40 MG tablet TAKE 1 TABLET(40 MG) BY MOUTH DAILY AT 6 PM Patient taking differently: Take 40 mg by mouth every  evening. 08/18/23   Pietro Redell RAMAN, MD  budesonide  (PULMICORT ) 0.5 MG/2ML nebulizer solution Take 2 mLs (0.5 mg total) by nebulization 2 (two) times daily. 01/13/24   [name] Dorn NOVAK, MD  buPROPion  (WELLBUTRIN  XL) 300 MG 24 hr tablet Take 300 mg by mouth daily. 05/11/16   [provider]  citalopram  (CELEXA ) 40 MG tablet Take 40 mg by mouth daily. 06/01/16   [provider]  clotrimazole -betamethasone  (LOTRISONE ) cream Apply 1 Application topically 2 (two) times daily. Apply to buttocks and left ankle & heel. Patient taking differently: Apply 1 Application topically 2 (two) times daily as needed (sin irritation). 02/05/24   Lucius Krabbe, NP  cyclobenzaprine  (FLEXERIL ) 5 MG tablet Take 1 tablet (5 mg total) by mouth 3 (three) times daily as needed for muscle spasms. 03/23/24   Lucius Krabbe, NP  Dupilumab  (DUPIXENT ) 300 MG/2ML SOAJ Inject 300 mg into the skin every 14 (fourteen) days. Patient taking differently: Inject 300 mg into the skin See  admin instructions. Inject 2mL (300mg ) into the skin every 14 days - every other Monday. 07/21/24   [name] Dorn NOVAK, MD  Fluticasone -Umeclidin-Vilant (TRELEGY ELLIPTA ) 200-62.5-25 MCG/ACT AEPB Inhale 1 puff into the lungs every evening.    [provider]  glipiZIDE  (GLUCOTROL  XL) 2.5 MG 24 hr tablet TAKE 1 TABLET(2.5 MG) BY MOUTH TWICE DAILY AFTER A MEAL Patient taking differently: Take 2.5-5 mg by mouth See admin instructions. Take 2 tablets (5mg ) by mouth with breakfast and take 1 tablet (2.5mg ) with supper. 05/03/24   Lucius Krabbe, NP  ipratropium-albuterol  (DUONEB) 0.5-2.5 (3) MG/3ML SOLN Take 3 mLs by nebulization 4 (four) times daily. 12/22/23   Darlean Ozell NOVAK, MD  MAGNESIUM  PO Take 1 tablet by mouth at bedtime as needed (relaxation, sleep).    [provider]  MELATONIN PO Take 1 tablet by mouth at bedtime as needed (relaxation, sleep).    [provider]  meloxicam  (MOBIC ) 15 MG tablet Take 15  mg by mouth daily as needed for pain. 07/16/24   [provider]  metoprolol  succinate (TOPROL -XL) 25 MG 24 hr tablet TAKE 1/2 TABLET(12.5 MG) BY MOUTH DAILY Patient taking differently: Take 12.5 mg by mouth See admin instructions. Take 1/2 tablet (12.5mg ) by mouth once daily at noon. 07/21/24   Pietro Redell RAMAN, MD  olmesartan  (BENICAR ) 40 MG tablet Take 1 tablet (40 mg total) by mouth daily. Patient taking differently: Take 40 mg by mouth See admin instructions. Take 1 tablet (40mg ) by mouth once daily at noon. 06/29/24 06/29/25  Darlean Ozell NOVAK, MD  pantoprazole  (PROTONIX ) 40 MG tablet One tablet by mouth daily 30 to 60 minutes before the first meal of the day Patient taking differently: Take 40 mg by mouth daily as needed (heartburn, acid reflux). 05/26/24   Darlean Ozell NOVAK, MD  predniSONE  (DELTASONE ) 10 MG tablet Take 1 tablet (10 mg total) by mouth daily with breakfast. 07/20/24   [name] Dorn NOVAK, MD  predniSONE  (DELTASONE ) 20 MG tablet Take 1.5 tablets (30 mg total) by mouth daily with breakfast for 2 days, THEN 1 tablet (20 mg total) daily with breakfast for 2 days. Then resume your home prednisone  dose. 08/04/24 08/08/24  Perri DELENA Meliton Mickey., MD  triamcinolone  ointment (KENALOG ) 0.5 % Apply topically 2 (two) times daily. Patient taking differently: Apply 1 Application topically 2 (two) times daily as needed (skin irritaiton). 06/24/24   Lucius Krabbe, NP    Physical Exam: Vitals:   08/05/24 0650 08/05/24 0651 08/05/24 0652 08/05/24 0653  BP:      Pulse: (!) 136 (!) 134 (!) 134 (!) 133  Resp: (!) 27 (!) 35 19 20  Temp:      TempSrc:      SpO2: 93% 95% 93% 94%  Weight:      Height:        Constitutional: Elderly male currently in some respiratory distress Eyes: PERRL, lids and conjunctivae normal ENMT: Mucous membranes are moist.  Normal dentition.  Neck: normal, supple, no masses, no JVD appreciated Respiratory: Creased aeration with expiratory wheezes  appreciated. Cardiovascular: Regular rate and rhythm, no murmurs / rubs / gallops. No extremity edema. 2+ pedal pulses. No carotid bruits.  Abdomen: no tenderness, no masses palpated. No hepatosplenomegaly. Bowel sounds positive.  Musculoskeletal: no clubbing / cyanosis. No joint deformity upper and lower extremities. Good ROM, no contractures. Normal muscle tone.  Skin: no rashes, lesions, ulcers. No induration Neurologic: CN 2-12 grossly intact. Sensation intact, DTR normal. Strength 5/5 in all 4.  Psychiatric: Normal judgment and insight. Alert and oriented x 3. Normal mood.   Data Reviewed:  Reviewed labs, imaging, and pertinent records as documented.  Assessment and Plan: Acute respiratory failure with hypoxia secondary to COPD exacerbation Patient presents with acute worsening shortness of breath and wheezing after leaving the hospital yesterday.  O2 saturations noted to be as low as 84% on room air for which patient was placed on 2 L nasal cannula oxygen.  Chest x-ray noted chronic interstitial changes without acute pulmonary findings. Influenza, COVID-19, and RSV screening negative patient given multiple DuoNebs, Solu-Medrol  125 mg IV, and magnesium  sulfate 2 g IV. - Admit to a telemetry bed - Continuous pulse oximetry with oxygen maintain O2 saturation greater than 90% - Incentive spirometry and flutter valve - Check procalcitonin - Continue DuoNeb breathing treatments 4 times daily and albuterol  treatments as needed - Brovana  and budesonide  nebs twice daily - Solu-Medrol  40 mg IV this evening, then resume prednisone  taper - Mucinex  -Physical therapy to eval and treat  Leukocytosis Acute.  WBC elevated at 13.9 which is higher than when patient left the hospital.  Possibly reactive to above. - Follow-up procalcitonin - Recheck CBC tomorrow morning  History of pneumonia and bacteremia Patient had just recently been discharged from the hospital after he was admitted and found to  have E. coli bacteremia as well as pneumonia.  Discharged home on a 10-day course of Augmentin . - Continue Augmentin  to complete 10-day course.  Monitor and consider need for broadening antibiotics  Essential hypertension Blood pressures currently maintained. - Continue olmesartan  and metoprolol   Controlled diabetes mellitus type 2, with steroid-induced hyperglycemia Last available hemoglobin A1c noted to be 6.1 - Hypoglycemic protocols - CBGs before every meal with sensitive SSI  CAD with history of CABG - Continue statin  Hyperlipidemia - Continue atorvastatin   Chronic kidney disease stage III A Creatinine elevated up to 1.39 with BUN 26.  Baseline creatinine appears to range from 1.1-1.3.  Suspect some mild renal insufficiency as a cause of his symptoms - Recheck kidney function in a.m.  Depression - Continue Wellbutrin  and Celexa   History of lung cancer Patient status post lobectomy  DVT prophylaxis: Lovenox  Advance Care Planning:   Code Status: Full Code   Consults:    Family Communication: none requested  Severity of Illness: The appropriate patient status for this patient is OBSERVATION. Observation status is judged to be reasonable and necessary in order to provide the required intensity of service to ensure the patient's safety. The patient's presenting symptoms, physical exam findings, and initial radiographic and laboratory data in the context of their medical condition is felt to place them at decreased risk for further clinical deterioration. Furthermore, it is anticipated that the patient will be medically stable for discharge from the hospital within 2 midnights of admission.   Author: Maximino DELENA Sharps, MD 08/05/2024 8:44 AM  For on call review [email or web address].

## 2024-08-05 NOTE — ED Notes (Signed)
 Patient given urinal.

## 2024-08-05 NOTE — ED Provider Notes (Signed)
 [place] EMERGENCY DEPARTMENT AT [facility] Provider Note   CSN: [id number] Arrival date & time: 08/05/24  0630     Patient presents with: Respiratory Distress   Gary Wright is a 78 y.o. male.   The history is provided by the patient, the EMS personnel and medical records. No language interpreter was used.     78 year old male significant history of stage IV COPD, CAD status post CABG, hypertension, hyperlipidemia, diabetes brought here via EMS from home for evaluation of shortness of breath.  Pt was discharged from hospital yesterday after he was hospitalized for shortness of breath and was diagnosed with community-acquired pneumonia leading to COPD exacerbation.  Patient also was found to have E. coli bacteremia.  Patient was discharged home with antibiotic and steroid taper.  He report this morning when he got up to use the bathroom he became very shortness of breath and weak.  EMS was called and patient did receive 3 rounds of DuoNebs, Solu-Medrol , and 2 mg of magnesium  with improvement of his symptoms.  EMS states patient was diffusely wheezing on initial evaluation but it has improved.  He was also was found to be hypoxic with O2 sats in the 80s improved with supplemental oxygen.  Patient did complain of some chest pressure during this episode which has abated.  He felt shaky.  He denies having fever, nausea or vomiting.  Prior to Admission medications   Medication Sig Start Date End Date Taking? Authorizing Provider  acetaminophen  (TYLENOL ) 500 MG tablet Take 1,000 mg by mouth 2 (two) times daily as needed for headache or fever (pain).    [provider]  albuterol  (PROVENTIL ) (2.5 MG/3ML) 0.083% nebulizer solution Take 3 mLs (2.5 mg total) by nebulization every 4 (four) hours as needed for wheezing or shortness of breath. 06/09/24   [name] Dorn NOVAK, MD  albuterol  (VENTOLIN  HFA) 108 (90 Base) MCG/ACT inhaler Inhale 1-2 puffs into the lungs every 4 (four)  hours as needed for wheezing or shortness of breath. 06/09/24   [name] Dorn NOVAK, MD  amoxicillin -clavulanate (AUGMENTIN ) 875-125 MG tablet Take 1 tablet by mouth 2 (two) times daily for 10 days. 08/04/24 08/14/24  Perri DELENA Meliton Mickey., MD  atorvastatin  (LIPITOR) 40 MG tablet TAKE 1 TABLET(40 MG) BY MOUTH DAILY AT 6 PM Patient taking differently: Take 40 mg by mouth every evening. 08/18/23   Pietro Redell RAMAN, MD  budesonide  (PULMICORT ) 0.5 MG/2ML nebulizer solution Take 2 mLs (0.5 mg total) by nebulization 2 (two) times daily. 01/13/24   [name] Dorn NOVAK, MD  buPROPion  (WELLBUTRIN  XL) 300 MG 24 hr tablet Take 300 mg by mouth daily. 05/11/16   [provider]  citalopram  (CELEXA ) 40 MG tablet Take 40 mg by mouth daily. 06/01/16   [provider]  clotrimazole -betamethasone  (LOTRISONE ) cream Apply 1 Application topically 2 (two) times daily. Apply to buttocks and left ankle & heel. Patient taking differently: Apply 1 Application topically 2 (two) times daily as needed (sin irritation). 02/05/24   Lucius Krabbe, NP  cyclobenzaprine  (FLEXERIL ) 5 MG tablet Take 1 tablet (5 mg total) by mouth 3 (three) times daily as needed for muscle spasms. 03/23/24   Lucius Krabbe, NP  Dupilumab  (DUPIXENT ) 300 MG/2ML SOAJ Inject 300 mg into the skin every 14 (fourteen) days. Patient taking differently: Inject 300 mg into the skin See admin instructions. Inject 2mL (300mg ) into the skin every 14 days - every other Monday. 07/21/24   [name] Dorn NOVAK, MD  Fluticasone -Umeclidin-Vilant (TRELEGY ELLIPTA ) 200-62.5-25 MCG/ACT  AEPB Inhale 1 puff into the lungs every evening.    [provider]  glipiZIDE  (GLUCOTROL  XL) 2.5 MG 24 hr tablet TAKE 1 TABLET(2.5 MG) BY MOUTH TWICE DAILY AFTER A MEAL Patient taking differently: Take 2.5-5 mg by mouth See admin instructions. Take 2 tablets (5mg ) by mouth with breakfast and take 1 tablet (2.5mg ) with supper. 05/03/24   Lucius Krabbe, NP   ipratropium-albuterol  (DUONEB) 0.5-2.5 (3) MG/3ML SOLN Take 3 mLs by nebulization 4 (four) times daily. 12/22/23   Darlean Ozell NOVAK, MD  MAGNESIUM  PO Take 1 tablet by mouth at bedtime as needed (relaxation, sleep).    [provider]  MELATONIN PO Take 1 tablet by mouth at bedtime as needed (relaxation, sleep).    [provider]  meloxicam  (MOBIC ) 15 MG tablet Take 15 mg by mouth daily as needed for pain. 07/16/24   [provider]  metoprolol  succinate (TOPROL -XL) 25 MG 24 hr tablet TAKE 1/2 TABLET(12.5 MG) BY MOUTH DAILY Patient taking differently: Take 12.5 mg by mouth See admin instructions. Take 1/2 tablet (12.5mg ) by mouth once daily at noon. 07/21/24   Pietro Redell RAMAN, MD  olmesartan  (BENICAR ) 40 MG tablet Take 1 tablet (40 mg total) by mouth daily. Patient taking differently: Take 40 mg by mouth See admin instructions. Take 1 tablet (40mg ) by mouth once daily at noon. 06/29/24 06/29/25  Darlean Ozell NOVAK, MD  pantoprazole  (PROTONIX ) 40 MG tablet One tablet by mouth daily 30 to 60 minutes before the first meal of the day Patient taking differently: Take 40 mg by mouth daily as needed (heartburn, acid reflux). 05/26/24   Darlean Ozell NOVAK, MD  predniSONE  (DELTASONE ) 10 MG tablet Take 1 tablet (10 mg total) by mouth daily with breakfast. 07/20/24   [name] Dorn NOVAK, MD  predniSONE  (DELTASONE ) 20 MG tablet Take 1.5 tablets (30 mg total) by mouth daily with breakfast for 2 days, THEN 1 tablet (20 mg total) daily with breakfast for 2 days. Then resume your home prednisone  dose. 08/04/24 08/08/24  Perri DELENA Meliton Mickey., MD  triamcinolone  ointment (KENALOG ) 0.5 % Apply topically 2 (two) times daily. Patient taking differently: Apply 1 Application topically 2 (two) times daily as needed (skin irritaiton). 06/24/24   Lucius Krabbe, NP    Allergies: Patient has no known allergies.    Review of Systems  All other systems reviewed and are negative.   Updated Vital Signs BP  132/69   Pulse (!) 133   Temp 98 F (36.7 C) (Oral)   Resp 20   Ht 5' 8 (1.727 m)   Wt 84 kg   SpO2 94%   BMI 28.16 kg/m   Physical Exam Constitutional:      General: He is not in acute distress.    Appearance: He is well-developed.     Comments: Elderly male laying in stretcher appears tremulous and tachypneic.  He is wearing a nonrebreather mask.  HENT:     Head: Atraumatic.  Eyes:     Conjunctiva/sclera: Conjunctivae normal.  Cardiovascular:     Rate and Rhythm: Tachycardia present.  Pulmonary:     Breath sounds: Wheezing present. No rhonchi or rales.  Abdominal:     Palpations: Abdomen is soft.  Musculoskeletal:     Cervical back: Normal range of motion and neck supple.     Right lower leg: No edema.     Left lower leg: No edema.  Skin:    Findings: No rash.  Neurological:     Mental  Status: He is alert. Mental status is at baseline.     (all labs ordered are listed, but only abnormal results are displayed) Labs Reviewed  BASIC METABOLIC PANEL WITH GFR - Abnormal; Notable for the following components:      Result Value   Glucose, Bld 199 (*)    BUN 26 (*)    Creatinine, Ser 1.39 (*)    Calcium  8.7 (*)    GFR, Estimated 52 (*)    All other components within normal limits  CBC WITH DIFFERENTIAL/PLATELET - Abnormal; Notable for the following components:   WBC 13.9 (*)    Neutro Abs 11.3 (*)    Abs Immature Granulocytes 0.08 (*)    All other components within normal limits  BRAIN NATRIURETIC PEPTIDE - Abnormal; Notable for the following components:   B Natriuretic Peptide 154.7 (*)    All other components within normal limits  I-STAT VENOUS BLOOD GAS, ED - Abnormal; Notable for the following components:   pH, Ven 7.446 (*)    pCO2, Ven 41.6 (*)    pO2, Ven 24 (*)    Bicarbonate 28.6 (*)    Acid-Base Excess 4.0 (*)    Calcium , Ion 1.10 (*)    All other components within normal limits  RESP PANEL BY RT-PCR (RSV, FLU A&B, COVID)  RVPGX2  TROPONIN I (HIGH  SENSITIVITY)  TROPONIN I (HIGH SENSITIVITY)    EKG: EKG Interpretation Date/Time:  Thursday August 05 2024 06:43:05 EDT Ventricular Rate:  130 PR Interval:  119 QRS Duration:  123 QT Interval:  342 QTC Calculation: 503 R Axis:   -87  Text Interpretation: Sinus tachycardia RBBB and LAFB Confirmed by Laurice Coy [phone]) on 08/05/2024 7:08:32 AM  Radiology: DG Chest Portable 1 View Result Date: 08/05/2024 CLINICAL DATA:  Shortness of breath. EXAM: PORTABLE CHEST 1 VIEW COMPARISON:  08/02/2024 FINDINGS: Lordotic positioning. The lungs are clear without focal pneumonia, edema, pneumothorax or pleural effusion. Interstitial markings are diffusely coarsened with chronic features. The cardiopericardial silhouette is within normal limits for size. No acute bony abnormality. Telemetry leads overlie the chest. IMPRESSION: Chronic interstitial coarsening without acute cardiopulmonary findings. Electronically Signed   By: Camellia Candle M.D.   On: 08/05/2024 07:00     .Critical Care  Performed by: Nivia Colon, PA-C Authorized by: Nivia Colon, PA-C   Critical care provider statement:    Critical care time (minutes):  30   Critical care was time spent personally by me on the following activities:  Development of treatment plan with patient or surrogate, discussions with consultants, evaluation of patient's response to treatment, examination of patient, ordering and review of laboratory studies, ordering and review of radiographic studies, ordering and performing treatments and interventions, pulse oximetry, re-evaluation of patient's condition and review of old charts    Medications Ordered in the ED  acetaminophen  (TYLENOL ) tablet 1,000 mg (has no administration in time range)  sodium chloride  0.9 % bolus 1,000 mL (0 mLs Intravenous Stopped 08/05/24 0818)                                    Medical Decision Making Amount and/or Complexity of Data Reviewed Labs: ordered. Radiology:  ordered.   BP 132/69   Pulse (!) 133   Temp 98 F (36.7 C) (Oral)   Resp 20   Ht 5' 8 (1.727 m)   Wt 84 kg   SpO2 94%   BMI 28.16  kg/m   72:88 AM  78 year old male significant history of stage IV COPD, CAD status post CABG, hypertension, hyperlipidemia, diabetes brought here via EMS from home for evaluation of shortness of breath.  Pt was discharged from hospital yesterday after he was hospitalized for shortness of breath and was diagnosed with community-acquired pneumonia leading to COPD exacerbation.  Patient also was found to have E. coli bacteremia.  Patient was discharged home with antibiotic and steroid taper.  He report this morning when he got up to use the bathroom he became very shortness of breath and weak.  EMS was called and patient did receive 3 rounds of DuoNebs, Solu-Medrol , and 2 mg of magnesium  with improvement of his symptoms.  EMS states patient was diffusely wheezing on initial evaluation but it has improved.  He was also was found to be hypoxic with O2 sats in the 80s improved with supplemental oxygen.  Patient did complain of some chest pressure during this episode which has abated.  He felt shaky.  He denies having fever, nausea or vomiting.  On exam this is an elderly male, laying on stretcher appears tremulous and tachypneic.  Patient however is able to speak in complete sentences.  Lung exam notable for some scattered wheezes.  He does not appear fluid overloaded.  He is moving all 4 extremities equally.  On the monitor, he is tachycardic with heart rates in the 130s while maintaining normal blood pressure.  EMR reviewed, patient recently had a chest CT angiogram several days ago for his complaint.  Fortunately no evidence of PE noted.  He does have some findings suggestive of bronchopneumonia.  -Labs ordered, independently viewed and interpreted by me.  Labs remarkable for VBG showing a pH of 7.44, no evidence of respiratory acidosis.  Mild AKI with creatinine of  1.39, IV fluids given.  White count of 13.9 likely in the setting of infection plus recent steroid.  BNP mildly elevated at 154, normal troponin, respiratory panel negative -The patient was maintained on a cardiac monitor.  I personally viewed and interpreted the cardiac monitored which showed an underlying rhythm of: Sinus tachycardia -Imaging independently viewed and interpreted by me and I agree with radiologist's interpretation.  Result remarkable for chest x-ray without any acute cardiopulmonary findings -This patient presents to the ED for concern of shortness of breath, this involves an extensive number of treatment options, and is a complaint that carries with it a high risk of complications and morbidity.  The differential diagnosis includes COPD exacerbation, pneumonia, PE, CHF -Co morbidities that complicate the patient evaluation includes COPD, CAD, hypertension, hyperlipidemia, diabetes -Treatment includes supplemental oxygen, Tylenol , Solu-Medrol , magnesium , DuoNeb -Reevaluation of the patient after these medicines showed that the patient improved -PCP office notes or outside notes reviewed -Discussion with specialist [facility] Dr. Claudene who agrees to admit pt for COPD exacerbation -Escalation to admission/observation considered: patient agreeable with admission     Final diagnoses:  Acute respiratory failure with hypoxia [facility])  COPD exacerbation [facility])    ED Discharge Orders     None          Nivia Colon, PA-C 08/05/24 0850    Laurice Maude BROCKS, MD 08/05/24 1525

## 2024-08-05 NOTE — ED Triage Notes (Signed)
 Pt BIB [facility] from home. Pt woke up w/ sudden onset of SOB; o2 84% on room air on ems arrival. Pt d/c yesterday from hospital with Pneumonia and sent home with abx. EMS administered  3Duonebs 125 solu medrol   2 mag  HR 120

## 2024-08-05 NOTE — Plan of Care (Signed)
  Problem: Education: Goal: Ability to describe self-care measures that may prevent or decrease complications (Diabetes Survival Skills Education) will improve Outcome: Progressing Goal: Individualized Educational Video(s) Outcome: Progressing   Problem: Coping: Goal: Ability to adjust to condition or change in health will improve Outcome: Progressing   Problem: Fluid Volume: Goal: Ability to maintain a balanced intake and output will improve Outcome: Progressing   Problem: Health Behavior/Discharge Planning: Goal: Ability to identify and utilize available resources and services will improve Outcome: Progressing Goal: Ability to manage health-related needs will improve Outcome: Progressing   Problem: Nutritional: Goal: Maintenance of adequate nutrition will improve Outcome: Progressing Goal: Progress toward achieving an optimal weight will improve Outcome: Progressing   Problem: Skin Integrity: Goal: Risk for impaired skin integrity will decrease Outcome: Progressing   Problem: Clinical Measurements: Goal: Ability to maintain clinical measurements within normal limits will improve Outcome: Progressing Goal: Will remain free from infection Outcome: Progressing Goal: Diagnostic test results will improve Outcome: Progressing Goal: Respiratory complications will improve Outcome: Progressing Goal: Cardiovascular complication will be avoided Outcome: Progressing

## 2024-08-05 NOTE — ED Notes (Signed)
Awaiting medication to be verified

## 2024-08-05 NOTE — Evaluation (Signed)
 Physical Therapy Evaluation Patient Details Name: Gary Wright MRN: [id number] DOB: 1946-08-12 Today's Date: 08/05/2024  History of Present Illness  Patient is a 78 y/o male admitted due to SOB with hypoxia.  Just d/c home after stay 10/13-10/15 due to PNA, sepsis, e-coli bacteremia.  PMH positive for stage IV COPD, CAD s/p CABG, HTN, HLD, DM.  Clinical Impression  Patient presents with mobility at functional baseline.  SpO2 maintained 93-96% on RA throughout mobility in hallway up to 250' with rollator.  Patient with mild to moderate dyspnea still able to hold conversation.  Feel no further skilled PT needs at this time.  PT will sign off, recommended mobility to see if remains inpatient.         If plan is discharge home, recommend the following: Assistance with cooking/housework   Can travel by private vehicle        Equipment Recommendations None recommended by PT  Recommendations for Other Services       Functional Status Assessment Patient has not had a recent decline in their functional status     Precautions / Restrictions Precautions Precautions: Fall Precaution/Restrictions Comments: watch O2      Mobility  Bed Mobility Overal bed mobility: Modified Independent                  Transfers Overall transfer level: Modified independent Equipment used: Rollator (4 wheels)               General transfer comment: used no device truly for transfer    Ambulation/Gait Ambulation/Gait assistance: Modified independent (Device/Increase time) Gait Distance (Feet): 250 Feet Assistive device: Rollator (4 wheels) Gait Pattern/deviations: Step-through pattern, Decreased stride length, Trunk flexed       General Gait Details: reports recently started using rollator, has cane he uses as well, no LOB, SpO2 monitored throughout  Stairs            Wheelchair Mobility     Tilt Bed    Modified Rankin (Stroke Patients Only)       Balance Overall  balance assessment: Mild deficits observed, not formally tested                                           Pertinent Vitals/Pain Pain Assessment Pain Assessment: Faces Faces Pain Scale: Hurts little more Pain Location: chronic back pain; down L leg Pain Descriptors / Indicators: Aching, Grimacing, Discomfort Pain Intervention(s): Monitored during session, Repositioned    Home Living Family/patient expects to be discharged to:: Private residence Living Arrangements: Spouse/significant other   Type of Home: House Home Access: Stairs to enter Entrance Stairs-Rails: Right Entrance Stairs-Number of Steps: 4   Home Layout: One level Home Equipment: Rollator (4 wheels);Cane - single point;Shower seat      Prior Function Prior Level of Function : Independent/Modified Independent             Mobility Comments: Uses rollator vs SPC ADLs Comments: Ind     Extremity/Trunk Assessment   Upper Extremity Assessment Upper Extremity Assessment: Overall WFL for tasks assessed (joint changes due to RA)    Lower Extremity Assessment Lower Extremity Assessment: Overall WFL for tasks assessed    Cervical / Trunk Assessment Cervical / Trunk Assessment: Kyphotic  Communication   Communication Communication: No apparent difficulties    Cognition Arousal: Alert Behavior During Therapy: WFL for tasks assessed/performed   PT -  Cognitive impairments: No apparent impairments                         Following commands: Intact       Cueing Cueing Techniques: Verbal cues     General Comments General comments (skin integrity, edema, etc.): SpO2 96% with ambulation on RA, mild dyspnea, still able to talk; reviewed energy conservation techniques, patient has considered walk in shower and plans to get estimate.    Exercises     Assessment/Plan    PT Assessment Patient does not need any further PT services  PT Problem List         PT Treatment  Interventions      PT Goals (Current goals can be found in the Care Plan section)  Acute Rehab PT Goals PT Goal Formulation: All assessment and education complete, DC therapy    Frequency       Co-evaluation               AM-PAC PT 6 Clicks Mobility  Outcome Measure Help needed turning from your back to your side while in a flat bed without using bedrails?: None Help needed moving from lying on your back to sitting on the side of a flat bed without using bedrails?: None Help needed moving to and from a bed to a chair (including a wheelchair)?: None Help needed standing up from a chair using your arms (e.g., wheelchair or bedside chair)?: None Help needed to walk in hospital room?: None Help needed climbing 3-5 steps with a railing? : A Little 6 Click Score: 23    End of Session   Activity Tolerance: Patient tolerated treatment well Patient left: in bed;with call bell/phone within reach   PT Visit Diagnosis: Other abnormalities of gait and mobility (R26.89);Unsteadiness on feet (R26.81)    Time: 8479-8451 PT Time Calculation (min) (ACUTE ONLY): 28 min   Charges:   PT Evaluation $PT Eval Low Complexity: 1 Low PT Treatments $Self Care/Home Management: 8-22 PT General Charges $$ ACUTE PT VISIT: 1 Visit         Micheline Portal, PT Acute Rehabilitation Services Office:[phone] 08/05/2024   [name] 08/05/2024, 5:04 PM

## 2024-08-05 NOTE — ED Notes (Signed)
 Unable to give 1000 meds due to not being verified by pharmacy yet. Pharmacy called to verify medications.

## 2024-08-06 ENCOUNTER — Other Ambulatory Visit: Payer: Self-pay

## 2024-08-06 ENCOUNTER — Other Ambulatory Visit (HOSPITAL_COMMUNITY): Payer: Self-pay

## 2024-08-06 DIAGNOSIS — J9601 Acute respiratory failure with hypoxia: Secondary | ICD-10-CM | POA: Diagnosis not present

## 2024-08-06 LAB — BASIC METABOLIC PANEL WITH GFR
Anion gap: 10 (ref 5–15)
BUN: 24 mg/dL — ABNORMAL HIGH (ref 8–23)
CO2: 26 mmol/L (ref 22–32)
Calcium: 8.5 mg/dL — ABNORMAL LOW (ref 8.9–10.3)
Chloride: 100 mmol/L (ref 98–111)
Creatinine, Ser: 1.34 mg/dL — ABNORMAL HIGH (ref 0.61–1.24)
GFR, Estimated: 55 mL/min — ABNORMAL LOW (ref >60)
Glucose, Bld: 182 mg/dL — ABNORMAL HIGH (ref 70–99)
Potassium: 4.5 mmol/L (ref 3.5–5.1)
Sodium: 136 mmol/L (ref 135–145)

## 2024-08-06 LAB — CBC
HCT: 34.5 % — ABNORMAL LOW (ref 39.0–52.0)
Hemoglobin: 11.5 g/dL — ABNORMAL LOW (ref 13.0–17.0)
MCH: 31.7 pg (ref 26.0–34.0)
MCHC: 33.3 g/dL (ref 30.0–36.0)
MCV: 95 fL (ref 80.0–100.0)
Platelets: 263 K/uL (ref 150–400)
RBC: 3.63 MIL/uL — ABNORMAL LOW (ref 4.22–5.81)
RDW: 13.5 % (ref 11.5–15.5)
WBC: 13.1 K/uL — ABNORMAL HIGH (ref 4.0–10.5)
nRBC: 0 % (ref 0.0–0.2)

## 2024-08-06 LAB — GLUCOSE, CAPILLARY
Glucose-Capillary: 209 mg/dL — ABNORMAL HIGH (ref 70–99)
Glucose-Capillary: 161 mg/dL — ABNORMAL HIGH (ref 70–99)

## 2024-08-06 MED ORDER — METOPROLOL SUCCINATE ER 25 MG PO TB24: 12.5000 mg | ORAL_TABLET | ORAL | Status: AC

## 2024-08-06 MED ORDER — PREDNISONE 10 MG PO TABS
ORAL_TABLET | ORAL | 0 refills | Status: DC
  Filled 2024-08-06: qty 30, 12d supply, fill #0

## 2024-08-06 NOTE — TOC Transition Note (Signed)
 Transition of Care [facility]) - Discharge Note   Patient Details  Name: Gary Wright MRN: [id number] Date of Birth: 02-24-1946  [facility]) CM/SW Contact:  Andrez JULIANNA George, RN Phone Number: 08/06/2024, 11:26 AM   Clinical Narrative:     Pt is discharging home with self care. No follow up per PT.  Pt has transportation home.  Final next level of care: Home/Self Care Barriers to Discharge: No Barriers Identified   Patient Goals and CMS Choice            Discharge Placement                       Discharge Plan and Services Additional resources added to the After Visit Summary for                                       Social Drivers of Health (SDOH) Interventions SDOH Screenings   Food Insecurity: No Food Insecurity (08/05/2024)  Housing: Low Risk  (08/05/2024)  Transportation Needs: Unknown (08/05/2024)  Utilities: Not At Risk (08/05/2024)  Alcohol Screen: Low Risk  (11/18/2023)  Depression (PHQ2-9): Medium Risk (11/18/2023)  Financial Resource Strain: Low Risk  (11/18/2023)  Physical Activity: Insufficiently Active (11/18/2023)  Social Connections: Moderately Isolated (08/05/2024)  Stress: No Stress Concern Present (11/18/2023)  Tobacco Use: Medium Risk (08/05/2024)  Health Literacy: Adequate Health Literacy (11/18/2023)     Readmission Risk Interventions     No data to display

## 2024-08-06 NOTE — Care Management Obs Status (Signed)
 MEDICARE OBSERVATION STATUS NOTIFICATION   Patient Details  Name: Shyquan Stallbaumer MRN: [id number] Date of Birth: [birth year]   Medicare Observation Status Notification Given:  Yes    Andrez JULIANNA George, RN 08/06/2024, 11:42 AM

## 2024-08-06 NOTE — Progress Notes (Signed)
 Mobility Specialist Progress Note:   08/06/24 1030  Mobility  Activity Ambulated with assistance  Level of Assistance Contact guard assist, steadying assist  Assistive Device Cane  Distance Ambulated (ft) 250 ft  Activity Response Tolerated well  Mobility Referral Yes  Mobility visit 1 Mobility  Mobility Specialist Start Time (ACUTE ONLY) 1030  Mobility Specialist Stop Time (ACUTE ONLY) 1045  Mobility Specialist Time Calculation (min) (ACUTE ONLY) 15 min   Pt agreeable to mobility session. Required occasional MinG assist for safety throughout ambulation with cane. X1 standing rest break taken d/t SOB, however SpO2 97% on RA. No overt unsteadiness noted. Pt back in chair with all needs met.   Therisa Rana Mobility Specialist Please contact via SecureChat or  Rehab office at [phone]

## 2024-08-06 NOTE — Discharge Summary (Signed)
 Physician Discharge Summary   Patient: Gary Wright MRN: [id number] DOB: 05-Jan-1946  Admit date:     08/05/2024  Discharge date: 08/06/24  Discharge Physician: Carliss LELON Canales   PCP: Lucius Krabbe, NP   Recommendations at discharge:    Pt to be discharged home.   If you experience worsening fever, chills, chest pain, shortness of breath, or other concerning symptoms, please call your PCP or go to the emergency department immediately.  Discharge Diagnoses: Principal Problem:   Acute respiratory failure with hypoxia (HCC) Active Problems:   COPD with acute exacerbation (HCC)   Leukocytosis   History of bacteremia   Essential hypertension   Drug-induced diabetes mellitus   Mild CAD   Hyperlipidemia   Chronic kidney disease, stage 3b (HCC)   Depression   History of lung cancer  Resolved Problems:   * No resolved hospital problems. *   Hospital Course:  78 y.o. male with medical history significant of prior tobacco use, stage IV COPD, CAD status post CABG, HTN, HLD, diabetes mellitus comes back to the hospital due to shortness of breath     Was recently hospitalized from 10/13-10/15 after coming in with shortness of breath found to have sepsis secondary to E. coli bacteremia with community-acquired pneumonia.   He experienced another breathing attack after being discharged from the hospital yesterday. He was supposed to start antibiotics this morning but had difficulty breathing after getting up to go to the bathroom, with his oxygen levels dropping significantly.   He has a history of stage 4 COPD and has been hospitalized multiple times due to exacerbations, with five serious episodes requiring hospital stays. He was on a daily dose of steroids and prednisone  during his previous hospital visit, which did not make a significant difference. He is currently on a tapered steroid regimen.   He has been using a nebulizer four times a day and another medication twice a day  for his breathing issues. Despite these treatments, he feels his condition has been deteriorating.   He felt hot, but the nurse adjusted the room temperature, which helped him feel cooler.   He has a history of smoking but quit years ago after being diagnosed with cancer, which led to the removal of his upper left lung lobe. He has not had a recurrence of cancer.   He also has diabetes, which was managed by his primary care physician. His blood sugar levels increased during his hospital stay, requiring insulin , likely due to steroid use.   He is coughing but not excessively. Upon EMS arrival O2 saturations noted to be 84% on room air.  Patient was given 3 DuoNeb breathing treatments, Solu-Medrol  125 mg IV, and magnesium  sulfate 2 g IV during transport.   In the emergency department patient was noted to be afebrile with heart rates elevated into the 130s, respirations 16-35, and O2 saturations currently maintained on 2 L nasal cannula oxygen.  Labs significant for WBC 13.9, BUN 26, creatinine 1.39, glucose 199, BNP 154.7, and high-sensitivity troponin 12.  Venous blood gas was obtained which noted pH of 7.446, pCO2 41.6, pO2 24.  Influenza, COVID-19, and RSV screening were negative.  Chest x-ray noted chronic interstitial coarsening without acute pulmonary findings.  Patient was given 1 L of IV fluids, 1000 mg of Tylenol , and 4 mg of morphine .  Assessment and Plan:  Acute hypoxic respiratory failure secondary to COPD exacerbation - Underlying stage IV COPD.  Worsening cough and shortness of breath on presentation.  Initially requiring  2 L nasal cannula maintain O2 sats greater than 90%.  Rapid flu/COVID/RSV negative.  Was initiated on IV Solu-Medrol , IV magnesium , scheduled nebulizer therapy.  Showed remarkable improvement throughout the course of hospital stay.  Patient weaned off of supplemental oxygen.  Ambulatory, eager for discharge home.  Will transition patient to p.o. prednisone  taper to take  as directed upon discharge, after which can resume his normal daily prednisone  regiment.  Continue to use home nebulizers.  History of pneumonia and E. coli bacteremia - Diagnosed at previous hospitalizations patient was discharged with course of p.o. Augmentin .  Patient should resume p.o. Augmentin  previously prescribed.  Diabetes mellitus - Resume home medication regimen  Hypertension/CAD/hyperlipidemia - Resume home meds  Acute kidney injury on CKD 3A - Creatinine 1.79 on presentation.  Showed improvement to 1.34 which is around his baseline.    Consultants: None Procedures performed: None Disposition: Home Diet recommendation:  Discharge Diet Orders (From admission, onward)     Start     Ordered   08/06/24 0000  Diet - low sodium heart healthy        08/06/24 1100           Cardiac and Carb modified diet  DISCHARGE MEDICATION: Allergies as of 08/06/2024   No Known Allergies      Medication List     TAKE these medications    albuterol  (2.5 MG/3ML) 0.083% nebulizer solution Commonly known as: PROVENTIL  Take 3 mLs (2.5 mg total) by nebulization every 4 (four) hours as needed for wheezing or shortness of breath.   albuterol  108 (90 Base) MCG/ACT inhaler Commonly known as: VENTOLIN  HFA Inhale 1-2 puffs into the lungs every 4 (four) hours as needed for wheezing or shortness of breath.   amoxicillin -clavulanate 875-125 MG tablet Commonly known as: AUGMENTIN  Take 1 tablet by mouth 2 (two) times daily for 10 days.   atorvastatin  40 MG tablet Commonly known as: LIPITOR TAKE 1 TABLET(40 MG) BY MOUTH DAILY AT 6 PM What changed: See the new instructions.   budesonide  0.5 MG/2ML nebulizer solution Commonly known as: Pulmicort  Take 2 mLs (0.5 mg total) by nebulization 2 (two) times daily.   buPROPion  300 MG 24 hr tablet Commonly known as: WELLBUTRIN  XL Take 300 mg by mouth daily.   citalopram  40 MG tablet Commonly known as: CELEXA  Take 40 mg by mouth  daily.   clotrimazole -betamethasone  cream Commonly known as: LOTRISONE  Apply 1 Application topically 2 (two) times daily. Apply to buttocks and left ankle & heel. What changed:  when to take this reasons to take this additional instructions   cyclobenzaprine  5 MG tablet Commonly known as: FLEXERIL  Take 1 tablet (5 mg total) by mouth 3 (three) times daily as needed for muscle spasms.   Dupixent  300 MG/2ML Soaj Generic drug: Dupilumab  Inject 300 mg into the skin every 14 (fourteen) days. What changed:  when to take this additional instructions   glipiZIDE  2.5 MG 24 hr tablet Commonly known as: GLUCOTROL  XL TAKE 1 TABLET(2.5 MG) BY MOUTH TWICE DAILY AFTER A MEAL What changed: See the new instructions.   ipratropium-albuterol  0.5-2.5 (3) MG/3ML Soln Commonly known as: DUONEB Take 3 mLs by nebulization 4 (four) times daily.   metoprolol  succinate 25 MG 24 hr tablet Commonly known as: TOPROL -XL Take 0.5 tablets (12.5 mg total) by mouth See admin instructions. Take 1/2 tablet (12.5mg ) by mouth once daily at noon. What changed: See the new instructions.   olmesartan  40 MG tablet Commonly known as: BENICAR  Take 1 tablet (40  mg total) by mouth daily. What changed:  when to take this additional instructions   pantoprazole  40 MG tablet Commonly known as: PROTONIX  One tablet by mouth daily 30 to 60 minutes before the first meal of the day What changed:  how much to take how to take this when to take this reasons to take this additional instructions   predniSONE  10 MG tablet Commonly known as: DELTASONE  Take 1 tablet (10 mg total) by mouth daily with breakfast. What changed: Another medication with the same name was changed. Make sure you understand how and when to take each.   predniSONE  10 MG tablet Commonly known as: DELTASONE  Take 4 tablets (40 mg total) by mouth daily for 3 days, THEN 3 tablets (30 mg total) daily for 3 days, THEN 2 tablets (20 mg total) daily for 3  days, THEN 1 tablet (10 mg total) daily for 3 days. Once taper complete, resume previous daily prednisone  therapy. Start taking on: August 06, 2024 What changed:  medication strength See the new instructions.   Trelegy Ellipta  200-62.5-25 MCG/ACT Aepb Generic drug: Fluticasone -Umeclidin-Vilant Inhale 1 puff into the lungs every evening.   triamcinolone  ointment 0.5 % Commonly known as: KENALOG  Apply topically 2 (two) times daily. What changed:  how much to take when to take this reasons to take this         Discharge Exam: Filed Weights   08/05/24 0639  Weight: 84 kg    GENERAL:  Alert, pleasant, no acute distress, in good spirits HEENT:  EOMI CARDIOVASCULAR:  RRR, no murmurs appreciated RESPIRATORY: Poor air movement, no wheezing GASTROINTESTINAL:  Soft, nontender, nondistended EXTREMITIES:  No LE edema bilaterally NEURO:  No new focal deficits appreciated SKIN:  No rashes noted PSYCH:  Appropriate mood and affect     Condition at discharge: improving  The results of significant diagnostics from this hospitalization (including imaging, microbiology, ancillary and laboratory) are listed below for reference.   Imaging Studies: DG Chest Portable 1 View Result Date: 08/05/2024 CLINICAL DATA:  Shortness of breath. EXAM: PORTABLE CHEST 1 VIEW COMPARISON:  08/02/2024 FINDINGS: Lordotic positioning. The lungs are clear without focal pneumonia, edema, pneumothorax or pleural effusion. Interstitial markings are diffusely coarsened with chronic features. The cardiopericardial silhouette is within normal limits for size. No acute bony abnormality. Telemetry leads overlie the chest. IMPRESSION: Chronic interstitial coarsening without acute cardiopulmonary findings. Electronically Signed   By: Camellia Candle M.D.   On: 08/05/2024 07:00   CT Angio Chest PE W and/or Wo Contrast Result Date: 08/02/2024 EXAM: CTA of the Chest with contrast for PE 08/02/2024 06:13:05 AM TECHNIQUE:  CTA of the chest was performed after the administration of 75 mL of iohexol  (OMNIPAQUE ) 350 MG/ML injection. Multiplanar reformatted images are provided for review. MIP images are provided for review. Automated exposure control, iterative reconstruction, and/or weight based adjustment of the mA/kV was utilized to reduce the radiation dose to as low as reasonably achievable. COMPARISON: Portable chest x-ray 08/02/2024. CTA chest 06/12/2024. CLINICAL HISTORY: 78 year old male with suspected PE, high probability, ongoing SOB, and history of Stage IV COPD. FINDINGS: PULMONARY ARTERIES: Pulmonary arteries are adequately opacified for evaluation. Good pulmonary artery contrast timing respiratory motion, primarily in the lower lungs. No pulmonary artery filling defect. Main pulmonary artery is normal in caliber. MEDIASTINUM: The heart demonstrates no acute abnormality. Normal heart size. No pericardial effusion. Calcified coronary artery and aortic atherosclerosis. LYMPH NODES: No mediastinal, hilar or axillary lymphadenopathy. LUNGS AND PLEURA: Asymmetric centrilobular emphysema. Evidence of previous left  upper lobectomy. Patchy new distal peribronchial ground glass opacity in the right upper lobe (series 6, images 44 and 53) appears infectious/inflammatory. Major airways remain patent. Calcified granuloma in the left lung is benign (no follow up imaging recommended). No pleural effusion or pneumothorax. UPPER ABDOMEN: Small hiatal hernia has not significantly changed. Motion artifact in the upper abdomen. No upper abdominal pneumoperitoneum or free fluid. SOFT TISSUES AND BONES: Chronic left posterior rib fractures, chronic unhealed posterior left 8th rib fracture. No acute or suspicious osseous lesion. No acute soft tissue abnormality. IMPRESSION: 1. No pulmonary embolism. 2. Emphysema. Prior left upper lobectomy. Patchy new distal peribronchial ground-glass opacity in the right upper lobe, favor acute infectious  exacerbation - Bronchopneumonia. Electronically signed by: Helayne Hurst MD 08/02/2024 06:38 AM EDT RP Workstation: [id number]   CT Head Wo Contrast Result Date: 08/02/2024 EXAM: CT HEAD WITHOUT CONTRAST 08/02/2024 06:13:05 AM TECHNIQUE: CT of the head was performed without the administration of intravenous contrast. Automated exposure control, iterative reconstruction, and/or weight based adjustment of the mA/kV was utilized to reduce the radiation dose to as low as reasonably achievable. COMPARISON: Report of brain MRI 10/12/2008 (no images available). CLINICAL HISTORY: 78 year old male. Mental status change, unknown cause. AMS. FINDINGS: BRAIN AND VENTRICLES: No acute intracranial hemorrhage identified. No evidence of acute infarct. Asymmetric extra-axial fluid over both cerebral hemispheres appears to be normal subarachnoid space CSF. No convincing subdural collection. No intracranial mass effect or midline shift. Brain volume is within normal limits for age. Gray-white differentiation is within normal limits for age. Calcified atherosclerosis at the skull base. No suspicious intracranial vascular hyperdensity. No hydrocephalus. ORBITS: Postoperative changes to both globes. SINUSES: No acute abnormality. SOFT TISSUES AND SKULL: Bulky craniocervical junction and C1-C2 bony degeneration. No acute soft tissue abnormality. No skull fracture. IMPRESSION: 1. No acute intracranial abnormality. Negative for age non-contrast CT appearance of the brain. Electronically signed by: Helayne Hurst MD 08/02/2024 06:32 AM EDT RP Workstation: [id number]   DG Chest Portable 1 View Result Date: 08/02/2024 EXAM: 1 VIEW(S) XRAY OF THE CHEST 08/02/2024 04:54:00 AM COMPARISON: Chest radiographs 07/01/2024 and earlier. CLINICAL HISTORY: 78 year old male with shortness of breath. FINDINGS: LINES, TUBES AND DEVICES: External tubing suspected, projecting over the medial left clavicle. LUNGS AND PLEURA: Lung volumes remain normal. No  focal pulmonary opacity. No pulmonary edema. No pleural effusion. No pneumothorax. HEART AND MEDIASTINUM: No acute abnormality of the cardiac and mediastinal silhouettes. BONES AND SOFT TISSUES: Advanced glenohumeral degeneration. No acute osseous abnormality. IMPRESSION: 1. No acute cardiopulmonary abnormality. Electronically signed by: Helayne Hurst MD 08/02/2024 05:03 AM EDT RP Workstation: [id number]   US  LIMITED JOINT SPACE STRUCTURES LOW LEFT(NO LINKED CHARGES) Result Date: 07/16/2024 Procedure: Ultrasound Guided Hip Acetabulofemoral Joint Injection Side: Left Diagnosis: Flare of osteoarthritis US  Indication: - accuracy is paramount for diagnosis - to ensure therapeutic efficacy or procedural success - to reduce procedural risk  After explaining the procedure, viable alternatives, risks, and answering any questions, consent was given verbally. The site was cleaned with chlorhexidine  prep. An ultrasound transducer was placed on the anterior thigh/hip.   The acetabular joint, labrum, and femoral shaft were identified.  The neurovascular structures were identified and an approach was found specifically avoiding these structures.  A steroid injection was performed under ultrasound guidance with sterile technique using 2ml of 1% lidocaine  without epinephrine  and 40 mg of triamcinolone  (KENALOG ) 40mg /ml. This was well tolerated and resulted in  relief.  Needle was removed and dressing placed and post injection instructions were given  including  a discussion of likely return of pain today after the anesthetic wears off (with the possibility of worsened pain) until the steroid starts to work in 1-3 days.   Pt was advised to call or return to clinic if these symptoms worsen or fail to improve as anticipated. Images permanently stored.    Microbiology: Results for orders placed or performed during the hospital encounter of 08/05/24  Resp panel by RT-PCR (RSV, Flu A&B, Covid) Anterior Nasal Swab     Status: None    Collection Time: 08/05/24  6:49 AM   Specimen: Anterior Nasal Swab  Result Value Ref Range Status   SARS Coronavirus 2 by RT PCR NEGATIVE NEGATIVE Final   Influenza A by PCR NEGATIVE NEGATIVE Final   Influenza B by PCR NEGATIVE NEGATIVE Final    Comment: (NOTE) The Xpert Xpress SARS-CoV-2/FLU/RSV plus assay is intended as an aid in the diagnosis of influenza from Nasopharyngeal swab specimens and should not be used as a sole basis for treatment. Nasal washings and aspirates are unacceptable for Xpert Xpress SARS-CoV-2/FLU/RSV testing.  Fact Sheet for Patients: BloggerCourse.com  Fact Sheet for Healthcare Providers: SeriousBroker.it  This test is not yet approved or cleared by the [place]  FDA and has been authorized for detection and/or diagnosis of SARS-CoV-2 by FDA under an Emergency Use Authorization (EUA). This EUA will remain in effect (meaning this test can be used) for the duration of the COVID-19 declaration under Section 564(b)(1) of the Act, 21 U.S.C. section 360bbb-3(b)(1), unless the authorization is terminated or revoked.     Resp Syncytial Virus by PCR NEGATIVE NEGATIVE Final    Comment: (NOTE) Fact Sheet for Patients: BloggerCourse.com  Fact Sheet for Healthcare Providers: SeriousBroker.it  This test is not yet approved or cleared by the [place]  FDA and has been authorized for detection and/or diagnosis of SARS-CoV-2 by FDA under an Emergency Use Authorization (EUA). This EUA will remain in effect (meaning this test can be used) for the duration of the COVID-19 declaration under Section 564(b)(1) of the Act, 21 U.S.C. section 360bbb-3(b)(1), unless the authorization is terminated or revoked.  Performed at [facility], [address], [place], [place] [address]     Labs: CBC: Recent Labs  Lab 08/02/24 [phone] 08/02/24 0503 08/03/24 0443  08/04/24 0451 08/05/24 0649 08/05/24 0655 08/06/24 0135  WBC 14.5*  --  15.8* 12.3* 13.9*  --  13.1*  NEUTROABS  --   --   --   --  11.3*  --   --   HGB 10.5*   < > 11.4* 11.8* 13.7 13.9 11.5*  HCT 32.0*   < > 34.5* 35.0* 42.3 41.0 34.5*  MCV 99.4  --  96.1 95.9 97.5  --  95.0  PLT 157  --  250 257 315  --  263   < > = values in this interval not displayed.   Basic Metabolic Panel: Recent Labs  Lab 08/02/24 0456 08/02/24 0503 08/03/24 0443 08/04/24 0451 08/05/24 0649 08/05/24 0655 08/06/24 0135  NA 135   < > 136 136 137 136 136  K 4.1   < > 4.3 3.7 4.6 4.5 4.5  CL 101  --  104 101 101  --  100  CO2 20*  --  23 26 24   --  26  GLUCOSE 228*  --  238* 141* 199*  --  182*  BUN 22  --  23 20 26*  --  24*  CREATININE 1.32*  --  1.18 1.10 1.39*  --  1.34*  CALCIUM  8.7*  --  8.3* 8.6* 8.7*  --  8.5*  MG  --   --   --  1.8  --   --   --    < > = values in this interval not displayed.   Liver Function Tests: Recent Labs  Lab 08/02/24 0456 08/04/24 0451  AST 16 14*  ALT 22 21  ALKPHOS 82 61  BILITOT 1.0 0.4  PROT 5.9* 5.0*  ALBUMIN 3.2* 2.6*   CBG: Recent Labs  Lab 08/04/24 1618 08/05/24 1139 08/05/24 1706 08/05/24 2056 08/06/24 0805  GLUCAP 195* 296* 235* 164* 209*    Discharge time spent: 28 minutes.  Length of inpatient stay: 0 days  Signed: Carliss LELON Canales, DO [facility] 08/06/2024

## 2024-08-06 NOTE — Plan of Care (Signed)

## 2024-08-06 NOTE — Progress Notes (Signed)
 Specialty Pharmacy Ongoing Clinical Assessment Note  Gary Wright is a 78 y.o. male who is being followed by the specialty pharmacy service for RxSp Asthma/COPD   Patient's specialty medication(s) reviewed today: Dupilumab  (Dupixent )   Missed doses in the last 4 weeks: 0   Patient/Caregiver did not have any additional questions or concerns.   Therapeutic benefit summary: Patient is achieving benefit   Adverse events/side effects summary: No adverse events/side effects   Patient's therapy is appropriate to: Continue    Goals Addressed             This Visit's Progress    Maintain optimal adherence to therapy   On track    Patient is on track. Patient will maintain adherence, adhere to provider and/or lab appointments, and avoid flare triggers         Follow up: 12 months  [facility] Specialty Pharmacist

## 2024-08-06 NOTE — Plan of Care (Signed)
  Problem: Education: Goal: Ability to describe self-care measures that may prevent or decrease complications (Diabetes Survival Skills Education) will improve Outcome: Progressing Goal: Individualized Educational Video(s) Outcome: Progressing   Problem: Coping: Goal: Ability to adjust to condition or change in health will improve Outcome: Progressing   Problem: Fluid Volume: Goal: Ability to maintain a balanced intake and output will improve Outcome: Progressing   Problem: Health Behavior/Discharge Planning: Goal: Ability to identify and utilize available resources and services will improve Outcome: Progressing Goal: Ability to manage health-related needs will improve Outcome: Progressing   Problem: Metabolic: Goal: Ability to maintain appropriate glucose levels will improve Outcome: Progressing   Problem: Nutritional: Goal: Maintenance of adequate nutrition will improve Outcome: Progressing Goal: Progress toward achieving an optimal weight will improve Outcome: Progressing   Problem: Education: Goal: Knowledge of General Education information will improve Description: Including pain rating scale, medication(s)/side effects and non-pharmacologic comfort measures Outcome: Progressing   Problem: Clinical Measurements: Goal: Will remain free from infection Outcome: Progressing   Problem: Activity: Goal: Risk for activity intolerance will decrease Outcome: Progressing   Problem: Nutrition: Goal: Adequate nutrition will be maintained Outcome: Progressing

## 2024-08-09 ENCOUNTER — Ambulatory Visit (INDEPENDENT_AMBULATORY_CARE_PROVIDER_SITE_OTHER): Admitting: Family

## 2024-08-09 ENCOUNTER — Telehealth: Payer: Self-pay | Admitting: Sports Medicine

## 2024-08-09 VITALS — BP 138/80 | HR 87 | Temp 98.4°F | Ht 68.0 in | Wt 182.5 lb

## 2024-08-09 DIAGNOSIS — E099 Drug or chemical induced diabetes mellitus without complications: Secondary | ICD-10-CM | POA: Diagnosis not present

## 2024-08-09 DIAGNOSIS — J441 Chronic obstructive pulmonary disease with (acute) exacerbation: Secondary | ICD-10-CM

## 2024-08-09 DIAGNOSIS — M25551 Pain in right hip: Secondary | ICD-10-CM | POA: Diagnosis not present

## 2024-08-09 DIAGNOSIS — M25552 Pain in left hip: Secondary | ICD-10-CM

## 2024-08-09 DIAGNOSIS — G8929 Other chronic pain: Secondary | ICD-10-CM

## 2024-08-09 MED ORDER — GLIPIZIDE ER 2.5 MG PO TB24: 2.5000 mg | ORAL_TABLET | ORAL | 2 refills | Status: DC

## 2024-08-09 MED ORDER — DICLOFENAC SODIUM 1 % EX GEL: 4.0000 g | Freq: Four times a day (QID) | CUTANEOUS | 3 refills | Status: DC | PRN

## 2024-08-09 NOTE — Telephone Encounter (Signed)
 Patient called and said he got a hip injection when he was here last and he is having pain in the left hip and it shoots down his leg. He is currently taking two tylenol  every four hours and its not helping. He states that primary care said not to take meloxicam  because of his heart condition. He says the pain is unbearable. He has been using a walker to get around. He also states that he just got out of the hospital recently with pneumonia. Please advise.

## 2024-08-09 NOTE — Progress Notes (Signed)
 Patient ID: Gary Wright, male    DOB: April 27, 1946, 78 y.o.   MRN: [id number]  Chief Complaint  Patient presents with   Follow-up    Pt was seen in ED on 10/13, blood sugars was elevated.   Discussed the use of AI scribe software for clinical note transcription with the patient, who gave verbal consent to proceed.  History of Present Illness Gary Wright is a 78 year old male with end-stage lung disease who presents for hospital follow up and with concerns about diabetes management and ongoing pain.  He manages diabetes with glipizide , taking two in the morning and one at night. His A1c improved to 6.1 in August from 7.7. He is cautious with his diet, avoiding sweets and white carbohydrates, with his wife's help. He is concerned about prednisone 's effect on his blood sugar as he is on a tapering dose.  He experiences severe pain due to rheumatoid arthritis and a pinched disc, affecting his sleep. He used pain medication during a recent hospital stay but finds Tylenol  ineffective.NSAIDs contraindicated.  He is considering diclofenac gel for arthritis pain and awaits a follow-up for his back.  He was recently hospitalized for pneumonia, treated with IV antibiotics and nebulizer treatments. He continues Augmentin  twice daily. His oxygen levels are generally stable, with one drop below 90 during hospitalization.   Assessment & Plan COPD w/exacerbation  He was advised in hospital he has end-stage chronic lung disease with recent hospitalization. Finished Augmentin . Oxygen levels generally well-managed, but desaturation below 90% during recent hospital visit. Prognosis varies according to hospital consultations. - Continue nebulizer treatments as prescribed. - Continue prednisone  taper pack as prescribed. - Follow up with pulmonologist on Wednesday for further evaluation and management.  Chronic low back pain with lumbar radiculopathy Chronic low back pain with lumbar radiculopathy,  exacerbated by a pinched disc. Current pain management with acetaminophen  is ineffective. Scheduled for a fourth epidural injection on November 11. Diclofenac gel suggested for arthritis-related pain. - Sending RX for diclofenac gel on affected joints up to four times a day. - Follow up with Dr Leonce for pain management options.  Type 2 diabetes mellitus Type 2 diabetes mellitus managed with glipizide . Blood glucose levels elevated due to high doses of prednisone . A1c improved to 6.1 from 7.7. Kidney function slightly impaired, with elevated creatinine in hospital but GFR at 60. - Continue glipizide , two in the morning and one at night. - Avoid sweets and maintain hydration. - Continue to monitor kidney function. - Avoid NSAIDs such as ibuprofen , Aleve, and Advil . - F/U in 2 months  CAP- pneumonia w/COPD exacerb. Recent pneumonia treated with IV abt and home Augmentin , now resolved. - Keep Pulmonary appt on Wednesday.  Subjective:    Outpatient Medications Prior to Visit  Medication Sig Dispense Refill   albuterol  (PROVENTIL ) (2.5 MG/3ML) 0.083% nebulizer solution Take 3 mLs (2.5 mg total) by nebulization every 4 (four) hours as needed for wheezing or shortness of breath. 360 mL 5   albuterol  (VENTOLIN  HFA) 108 (90 Base) MCG/ACT inhaler Inhale 1-2 puffs into the lungs every 4 (four) hours as needed for wheezing or shortness of breath. 20.1 g 3   atorvastatin  (LIPITOR) 40 MG tablet TAKE 1 TABLET(40 MG) BY MOUTH DAILY AT 6 PM (Patient taking differently: Take 40 mg by mouth every evening.) 90 tablet 3   budesonide  (PULMICORT ) 0.5 MG/2ML nebulizer solution Take 2 mLs (0.5 mg total) by nebulization 2 (two) times daily. 120 mL 11   buPROPion  (WELLBUTRIN  XL)  300 MG 24 hr tablet Take 300 mg by mouth daily.  1   citalopram  (CELEXA ) 40 MG tablet Take 40 mg by mouth daily.  0   clotrimazole -betamethasone  (LOTRISONE ) cream Apply 1 Application topically 2 (two) times daily. Apply to buttocks and left  ankle & heel. (Patient taking differently: Apply 1 Application topically 2 (two) times daily as needed (sin irritation).) 45 g 2   cyclobenzaprine  (FLEXERIL ) 5 MG tablet Take 1 tablet (5 mg total) by mouth 3 (three) times daily as needed for muscle spasms. 30 tablet 5   Dupilumab  (DUPIXENT ) 300 MG/2ML SOAJ Inject 300 mg into the skin every 14 (fourteen) days. (Patient taking differently: Inject 300 mg into the skin See admin instructions. Inject 2mL (300mg ) into the skin every 14 days - every other Monday.) 4 mL 3   Fluticasone -Umeclidin-Vilant (TRELEGY ELLIPTA ) 200-62.5-25 MCG/ACT AEPB Inhale 1 puff into the lungs every evening.     glipiZIDE  (GLUCOTROL  XL) 2.5 MG 24 hr tablet TAKE 1 TABLET(2.5 MG) BY MOUTH TWICE DAILY AFTER A MEAL 60 tablet 2   ipratropium-albuterol  (DUONEB) 0.5-2.5 (3) MG/3ML SOLN Take 3 mLs by nebulization 4 (four) times daily. 360 mL 11   metoprolol  succinate (TOPROL -XL) 25 MG 24 hr tablet Take 0.5 tablets (12.5 mg total) by mouth See admin instructions. Take 1/2 tablet (12.5mg ) by mouth once daily at noon.     olmesartan  (BENICAR ) 40 MG tablet Take 1 tablet (40 mg total) by mouth daily. (Patient taking differently: Take 40 mg by mouth See admin instructions. Take 1 tablet (40mg ) by mouth once daily at noon.) 30 tablet 11   amoxicillin -clavulanate (AUGMENTIN ) 875-125 MG tablet Take 1 tablet by mouth 2 (two) times daily for 10 days. (Patient not taking: Reported on 08/05/2024) 20 tablet 0   pantoprazole  (PROTONIX ) 40 MG tablet One tablet by mouth daily 30 to 60 minutes before the first meal of the day (Patient taking differently: Take 40 mg by mouth daily as needed (heartburn, acid reflux).) 90 tablet 1   predniSONE  (DELTASONE ) 10 MG tablet Take 1 tablet (10 mg total) by mouth daily with breakfast. 30 tablet 3   predniSONE  (DELTASONE ) 10 MG tablet Take 4 tablets (40 mg total) by mouth daily for 3 days, THEN 3 tablets (30 mg total) daily for 3 days, THEN 2 tablets (20 mg total) daily  for 3 days, THEN 1 tablet (10 mg total) daily for 3 days. Once taper complete, resume previous daily prednisone  therapy. 30 tablet 0   triamcinolone  ointment (KENALOG ) 0.5 % Apply topically 2 (two) times daily. (Patient taking differently: Apply 1 Application topically 2 (two) times daily as needed (skin irritaiton).) 30 g 11   No facility-administered medications prior to visit.   Past Medical History:  Diagnosis Date   Acute dyspnea 03/24/2022   Acute exacerbation of chronic obstructive pulmonary disease (COPD) (HCC) 03/25/2022   Asthma    CAD (coronary artery disease) of artery bypass graft 06/13/2016   Chest pain    COPD (chronic obstructive pulmonary disease) (HCC)    COPD with acute exacerbation (HCC) 11/02/2020   Quit smokig 2019  - PFT's  121/22/23  FEV1 1.83 (60 % ) ratio 0.57  p 15 % improvement from saba p 0 prior to study with DLCO  16.4 (65%)   and FV curve mildly concave  - try off acei and on otc gerd rx 06/02/2023 >>>       Depression 09/01/2007   Qualifier: Diagnosis of  By: Nickola CMA, [place]  Elevated troponin 06/13/2016   History of lung cancer 11/02/2020   Hyperlipidemia 11/02/2020   Hypertension    Prolonged QT interval 11/02/2020   Stress-induced cardiomyopathy 06/13/2016   TOBACCO ABUSE 09/01/2007   Qualifier: Diagnosis of   By: Nickola CMA, [place]         Past Surgical History:  Procedure Laterality Date   BACK SURGERY     fusion in 1973   CARDIAC CATHETERIZATION N/A 06/13/2016   Procedure: Left Heart Cath and Coronary Angiography;  Surgeon: Lonni Hanson, MD;  Location: [facility] INVASIVE CV LAB;  Service: Cardiovascular;  Laterality: N/A;   INGUINAL HERNIA REPAIR Right 04/21/2018   Procedure: RIGHT INGUINAL HERNIA REPAIR ERAS PATHWAY;  Surgeon: Vanderbilt Ned, MD;  Location: [facility] OR;  Service: General;  Laterality: Right;  TAP BLOCK   INSERTION OF MESH Right 04/21/2018   Procedure: INSERTION OF MESH;  Surgeon: Vanderbilt Ned, MD;  Location: [facility] OR;  Service:  General;  Laterality: Right;   LUNG REMOVAL, PARTIAL Left    left upper lung removed about 6-7 years ago reported by pt   TONSILLECTOMY     No Known Allergies    Objective:    Physical Exam Vitals and nursing note reviewed.  Constitutional:      General: He is not in acute distress.    Appearance: Normal appearance. He is obese.  HENT:     Head: Normocephalic.  Cardiovascular:     Rate and Rhythm: Normal rate and regular rhythm.  Pulmonary:     Effort: Pulmonary effort is normal.     Breath sounds: Normal breath sounds.  Musculoskeletal:        General: Normal range of motion.     Cervical back: Normal range of motion.  Skin:    General: Skin is warm and dry.  Neurological:     Mental Status: He is alert and oriented to person, place, and time.  Psychiatric:        Mood and Affect: Mood normal.    BP 138/80 (BP Location: Left Arm, Patient Position: Sitting)   Pulse 87   Temp 98.4 F (36.9 C) (Temporal)   Ht 5' 8 (1.727 m)   Wt 182 lb 8 oz (82.8 kg)   SpO2 98%   BMI 27.75 kg/m  Wt Readings from Last 3 Encounters:  08/09/24 182 lb 8 oz (82.8 kg)  08/05/24 185 lb 3 oz (84 kg)  08/02/24 186 lb (84.4 kg)      Lucius Krabbe, NP

## 2024-08-10 ENCOUNTER — Ambulatory Visit (HOSPITAL_COMMUNITY)

## 2024-08-10 ENCOUNTER — Ambulatory Visit: Admitting: Sports Medicine

## 2024-08-10 VITALS — BP 130/70 | HR 114 | Ht 68.0 in

## 2024-08-10 DIAGNOSIS — M51362 Other intervertebral disc degeneration, lumbar region with discogenic back pain and lower extremity pain: Secondary | ICD-10-CM | POA: Diagnosis not present

## 2024-08-10 DIAGNOSIS — G8929 Other chronic pain: Secondary | ICD-10-CM | POA: Diagnosis not present

## 2024-08-10 NOTE — Progress Notes (Signed)
 Ben Brenner Visconti D.[name] [address] [place] [address] Phone: [phone]   Assessment and Plan:     1. Chronic bilateral low back pain with left-sided sciatica (Primary) 2. Degeneration of intervertebral disc of lumbar region with discogenic back pain and lower extremity pain -Chronic with exacerbation, subsequent visit - Significant worsening of low back pain with radicular symptoms in the left lower extremity.  Consistent with degenerative changes seen on lumbar MRI most significant at L3-L4.  Patient had no significant relief after intra-articular hip CSI at office visit on 07/12/2024, likely indicating lumbar etiology as primary pain generator - Patient has had benefit in the past from epidural CSI.  Patient elected for repeat epidural CSI.  I recommend left-sided L3-L4 epidural.  Order placed - Use meloxicam  7.5 mg daily as needed for breakthrough pain.  Recommend limiting chronic NSAIDs to 1-2 doses per week to prevent long-term side effects. Use Tylenol  500 to 1000 mg tablets 2-3 times a day as needed for day-to-day pain relief.       Pertinent previous records reviewed include none   Follow Up: 2 weeks after epidural to review benefit.  Could consider repeat epidural versus neurosurgery referral   Subjective:   I, Claretha Schimke am a scribe for Dr. Leonce.   Chief Complaint: Low back pain with left-sided radicular symptoms  HPI:   01/29/2024 Patient is a 78 year old male with hip pain. Patient states that he has had pain for over a year. Hip pain has increased low back pain that radiates down to the hips. Walks with a cane. Prednisone  helped some with his hands but not his hip. No meds for the pain. No numbness or tingling. He has decreased ROM. When he turns his right leg in he has hip pain. Hx of back surgery.    02/12/2024 Patient states pain is now going down the right leg with numbness . When he walks he has sharp pain left  side low back    03/12/2024 Patient states he is the same . Hips feel pretty good    04/09/2024 Patient states that he got about 90 % improvement but the pain is starting to come back    05/03/2024 Patient states he got about 60 % improvement he still has pain in the center his back  down his legs . But he has had a lot of Improvement   05/27/2024 Patient states rad thinks he needs another injection in 3 months . States his hips are bothering him    07/13/2024 Patient states he has been flared for about 2 weeks now on the left side. Right side is okay   08/10/24 Patient states is in a lot of pain and can't take anymore. He is hoping to get shots today but does not know if he can because he recently had one. Left sided pain that does down the leg. He has to try to sleep in a chair because laying in the bed is bad. Been taking tylenol  but it doesn't help the pain.   Relevant Historical Information: hypertension, COPD, drug induced DM, CKD stage IIIb  Additional pertinent review of systems negative.   Current Outpatient Medications:    albuterol  (PROVENTIL ) (2.5 MG/3ML) 0.083% nebulizer solution, Take 3 mLs (2.5 mg total) by nebulization every 4 (four) hours as needed for wheezing or shortness of breath., Disp: 360 mL, Rfl: 5   albuterol  (VENTOLIN  HFA) 108 (90 Base) MCG/ACT inhaler, Inhale 1-2 puffs into  the lungs every 4 (four) hours as needed for wheezing or shortness of breath., Disp: 20.1 g, Rfl: 3   amoxicillin -clavulanate (AUGMENTIN ) 875-125 MG tablet, Take 1 tablet by mouth 2 (two) times daily for 10 days. (Patient not taking: Reported on 08/05/2024), Disp: 20 tablet, Rfl: 0   atorvastatin  (LIPITOR) 40 MG tablet, TAKE 1 TABLET(40 MG) BY MOUTH DAILY AT 6 PM (Patient taking differently: Take 40 mg by mouth every evening.), Disp: 90 tablet, Rfl: 3   budesonide  (PULMICORT ) 0.5 MG/2ML nebulizer solution, Take 2 mLs (0.5 mg total) by nebulization 2 (two) times daily., Disp: 120 mL, Rfl: 11    buPROPion  (WELLBUTRIN  XL) 300 MG 24 hr tablet, Take 300 mg by mouth daily., Disp: , Rfl: 1   citalopram  (CELEXA ) 40 MG tablet, Take 40 mg by mouth daily., Disp: , Rfl: 0   clotrimazole -betamethasone  (LOTRISONE ) cream, Apply 1 Application topically 2 (two) times daily. Apply to buttocks and left ankle & heel. (Patient taking differently: Apply 1 Application topically 2 (two) times daily as needed (sin irritation).), Disp: 45 g, Rfl: 2   cyclobenzaprine  (FLEXERIL ) 5 MG tablet, Take 1 tablet (5 mg total) by mouth 3 (three) times daily as needed for muscle spasms., Disp: 30 tablet, Rfl: 5   diclofenac Sodium (VOLTAREN) 1 % GEL, Apply 4 g topically 4 (four) times daily as needed. Rub on sore joints., Disp: 100 g, Rfl: 3   Dupilumab  (DUPIXENT ) 300 MG/2ML SOAJ, Inject 300 mg into the skin every 14 (fourteen) days. (Patient taking differently: Inject 300 mg into the skin See admin instructions. Inject 2mL (300mg ) into the skin every 14 days - every other Monday.), Disp: 4 mL, Rfl: 3   Fluticasone -Umeclidin-Vilant (TRELEGY ELLIPTA ) 200-62.5-25 MCG/ACT AEPB, Inhale 1 puff into the lungs every evening., Disp: , Rfl:    glipiZIDE  (GLUCOTROL  XL) 2.5 MG 24 hr tablet, Take 1 tablet (2.5 mg total) by mouth as directed. Take 2 pills in the morning and 1 pill in the evening., Disp: 90 tablet, Rfl: 2   ipratropium-albuterol  (DUONEB) 0.5-2.5 (3) MG/3ML SOLN, Take 3 mLs by nebulization 4 (four) times daily., Disp: 360 mL, Rfl: 11   metoprolol  succinate (TOPROL -XL) 25 MG 24 hr tablet, Take 0.5 tablets (12.5 mg total) by mouth See admin instructions. Take 1/2 tablet (12.5mg ) by mouth once daily at noon., Disp: , Rfl:    olmesartan  (BENICAR ) 40 MG tablet, Take 1 tablet (40 mg total) by mouth daily. (Patient taking differently: Take 40 mg by mouth See admin instructions. Take 1 tablet (40mg ) by mouth once daily at noon.), Disp: 30 tablet, Rfl: 11   pantoprazole  (PROTONIX ) 40 MG tablet, One tablet by mouth daily 30 to 60 minutes  before the first meal of the day (Patient taking differently: Take 40 mg by mouth daily as needed (heartburn, acid reflux).), Disp: 90 tablet, Rfl: 1   predniSONE  (DELTASONE ) 10 MG tablet, Take 1 tablet (10 mg total) by mouth daily with breakfast., Disp: 30 tablet, Rfl: 3   predniSONE  (DELTASONE ) 10 MG tablet, Take 4 tablets (40 mg total) by mouth daily for 3 days, THEN 3 tablets (30 mg total) daily for 3 days, THEN 2 tablets (20 mg total) daily for 3 days, THEN 1 tablet (10 mg total) daily for 3 days. Once taper complete, resume previous daily prednisone  therapy., Disp: 30 tablet, Rfl: 0   triamcinolone  ointment (KENALOG ) 0.5 %, Apply topically 2 (two) times daily. (Patient taking differently: Apply 1 Application topically 2 (two) times daily as needed (skin irritaiton).),  Disp: 30 g, Rfl: 11   Objective:     Vitals:   08/10/24 1116  BP: 130/70  Pulse: (!) 114  SpO2: 97%  Height: 5' 8 (1.727 m)      Body mass index is 27.75 kg/m.    Physical Exam:    Gen: Appears well, nad, nontoxic and pleasant Psych: Alert and oriented, appropriate mood and affect Neuro: Decree sensation along anterior thigh wrapping around medial leg on right.  Otherwise sensation intact knee extension and flexion 4+/5 on right compared to left, otherwise strength is 5/5 in upper and lower extremities, muscle tone wnl Skin: no susupicious lesions or rashes   Back - Normal skin, Spine with normal alignment and no deformity.   No tenderness to vertebral process palpation.   Paraspinous muscles are mildly tender and without spasm NTTP gluteal musculature Straight leg raise positive left   Gait antalgic, short and slow steps.  Using rollator for ambulatory assistance Positive FADIR and FABER and logroll bilaterally, worse on left    Electronically signed by:  [name] 11:48 AM 08/10/24

## 2024-08-10 NOTE — Patient Instructions (Signed)
 Epidural Left sided L3-L4. - Use Tylenol  500 to 1000 mg tablets 2-3 times a day for day-to-day pain relief Follow up 2 weeks after Epidural.

## 2024-08-11 ENCOUNTER — Encounter: Payer: Self-pay | Admitting: Pulmonary Disease

## 2024-08-11 ENCOUNTER — Ambulatory Visit: Admitting: Pulmonary Disease

## 2024-08-11 VITALS — BP 119/66 | HR 107 | Ht 68.0 in | Wt 182.0 lb

## 2024-08-11 DIAGNOSIS — I2510 Atherosclerotic heart disease of native coronary artery without angina pectoris: Secondary | ICD-10-CM | POA: Diagnosis not present

## 2024-08-11 DIAGNOSIS — C3490 Malignant neoplasm of unspecified part of unspecified bronchus or lung: Secondary | ICD-10-CM | POA: Diagnosis not present

## 2024-08-11 DIAGNOSIS — Z87891 Personal history of nicotine dependence: Secondary | ICD-10-CM

## 2024-08-11 DIAGNOSIS — Z902 Acquired absence of lung [part of]: Secondary | ICD-10-CM

## 2024-08-11 DIAGNOSIS — J4489 Other specified chronic obstructive pulmonary disease: Secondary | ICD-10-CM

## 2024-08-11 DIAGNOSIS — E785 Hyperlipidemia, unspecified: Secondary | ICD-10-CM

## 2024-08-11 DIAGNOSIS — I10 Essential (primary) hypertension: Secondary | ICD-10-CM

## 2024-08-11 NOTE — Progress Notes (Signed)
 Synopsis: Referred in August 2023 for COPD by Victory Hoops, PA  Subjective:   PATIENT ID: Gary Wright GENDER: male DOB: January 25, 1946, MRN: [id number]  HPI  No chief complaint on file.  Gary Wright is a 78 year old male, former smoker with CAD, hypertension, hyperlipidemia and lung cancer s/p LUL lobectomy 10/30/2018 who returns to pulmonary clinic for COPD.   OV 12/18/2023 He is experiencing a recent flare-up of asthma, characterized by increased use of his rescue inhaler and coughing up yellow mucus. He can usually anticipate these flare-ups as they are preceded by these symptoms. During the current episode, he did not require an overnight hospital stay but did receive a shot of medication.  He is currently experiencing increased wheezing, chest tightness, and a significant amount of coughing, with the mucus changing from clear to yellow. No side effects from his current medications.  He started Dupixent  last month and is scheduled to take his third shot next week. He is currently on a daily dose of 15 mg of prednisone . He also uses a Trelegy inhaler, taking one puff daily, and utilizes an albuterol  nebulizer first thing in the morning. Recently, he has had to use his rescue inhaler more frequently than usual.  OV 01/13/24 He has been experiencing weakness, which he attributes to stopping prednisone  after taking it for two to three months due to his prescription running out and no refills being available. He is awaiting a new delivery of his injections and has not had any adverse reactions to them.  He uses Pulmicort  and DuoNeb nebulizer treatments twice daily and has not been using inhalers. He was previously on Trelegy but stopped due to throat irritation. Since starting new nebulizer medications, he initially coughed up thick yellow mucus, which has since cleared, and he is now experiencing minimal wheezing and coughing.  He is currently taking two medications for reflux,  which have alleviated his heartburn. He also has a persistent sore throat and questions the necessity of continuing a 500 mg medication due to a discount running out.  He quit smoking, although his wife smokes but not around him. He ensures adherence to his nebulizer treatment schedule and has the time to complete them as prescribed.  OV 06/09/24 He experiences significant difficulty breathing over the past two weeks, worsening even at rest. Physical activity exacerbates his dyspnea, causing him to 'fight for his breath.' He denies chest pain but reports chest tightness and no peripheral edema.  He is on a tapering dose of prednisone , initially effective but now less so. He uses his rescue inhaler more frequently and continues nebulizer treatments six times daily, which temporarily relieve wheezing. His medications include Dupixent  every fourteen days and Trelegy 200. A lapse in riflumilast due to a coupon expiration may have impacted his regimen.  He attributes some breathing difficulties to hot and humid weather, which he believes may trigger flare-ups. He feels weaker than usual and is concerned about the rapid tapering of prednisone .  OV 07/20/24 He experiences significant weakness and chest tightness, severely limiting his ability to walk. Prednisone  provides initial relief, but symptoms recur with dose reduction. Current medications include Pulmicort  nebulizer twice daily, Trelegy, and Dupixent  injections biweekly, with the last dose given yesterday. Despite these, wheezing and chest tightness persist without mucus production.  He was not able to obtain [place]  nebulizer treatments due to cost.  OV 08/11/24 Patient was admitted 10/13 to 10/15 for pneumonia and E. Coli bacteremia, then 10/16 to 10/17 respiratory failure and  COPD exacerbation. He is currently feeling better. He is completing course of steroids and augmentin  from discharge. He reports feeling a bit weak in general but is trying  to stay as active as possible.   Past Medical History:  Diagnosis Date   Acute dyspnea 03/24/2022   Acute exacerbation of chronic obstructive pulmonary disease (COPD) (HCC) 03/25/2022   Asthma    CAD (coronary artery disease) of artery bypass graft 06/13/2016   Chest pain    COPD (chronic obstructive pulmonary disease) (HCC)    COPD with acute exacerbation (HCC) 11/02/2020   Quit smokig 2019  - PFT's  121/22/23  FEV1 1.83 (60 % ) ratio 0.57  p 15 % improvement from saba p 0 prior to study with DLCO  16.4 (65%)   and FV curve mildly concave  - try off acei and on otc gerd rx 06/02/2023 >>>       Depression 09/01/2007   Qualifier: Diagnosis of  By: Nickola CMA, [place]     Elevated troponin 06/13/2016   History of lung cancer 11/02/2020   Hyperlipidemia 11/02/2020   Hypertension    Prolonged QT interval 11/02/2020   Stress-induced cardiomyopathy 06/13/2016   TOBACCO ABUSE 09/01/2007   Qualifier: Diagnosis of   By: Nickola CMA, [place]           Family History  Problem Relation Age of Onset   Hypertension Mother    Hypertension Father      Social History   Socioeconomic History   Marital status: Married    Spouse name: Deland   Number of children: 2   Years of education: Not on file   Highest education level: Not on file  Occupational History   Occupation: retired    Associate Professor: [employer]  Tobacco Use   Smoking status: Former    Current packs/day: 0.00    Types: Cigarettes    Quit date: 2019    Years since quitting: 6.8   Smokeless tobacco: Never  Vaping Use   Vaping status: Never Used  Substance and Sexual Activity   Alcohol use: No    Comment: No alcohol for 17 years   Drug use: No   Sexual activity: Not on file  Other Topics Concern   Not on file  Social History Narrative   Married for 27 years with 3 children   Social Drivers of Corporate Investment Banker Strain: Low Risk  (11/18/2023)   Overall Financial Resource Strain (CARDIA)    Difficulty of  Paying Living Expenses: Not hard at all  Food Insecurity: No Food Insecurity (08/05/2024)   Hunger Vital Sign    Worried About Running Out of Food in the Last Year: Never true    Ran Out of Food in the Last Year: Never true  Transportation Needs: Unknown (08/05/2024)   PRAPARE - Administrator, Civil Service (Medical): Patient declined    Lack of Transportation (Non-Medical): No  Physical Activity: Insufficiently Active (11/18/2023)   Exercise Vital Sign    Days of Exercise per Week: 3 days    Minutes of Exercise per Session: 10 min  Stress: No Stress Concern Present (11/18/2023)   [employer] of Occupational Health - Occupational Stress Questionnaire    Feeling of Stress : Not at all  Social Connections: Moderately Isolated (08/05/2024)   Social Connection and Isolation Panel    Frequency of Communication with Friends and Family: Three times a week    Frequency of Social Gatherings with Friends and Family:  Never    Attends Religious Services: Never    Active Member of Clubs or Organizations: No    Attends Banker Meetings: Never    Marital Status: Married  Catering Manager Violence: Not At Risk (08/05/2024)   Humiliation, Afraid, Rape, and Kick questionnaire    Fear of Current or Ex-Partner: No    Emotionally Abused: No    Physically Abused: No    Sexually Abused: No     No Known Allergies   Outpatient Medications Prior to Visit  Medication Sig Dispense Refill   albuterol  (PROVENTIL ) (2.5 MG/3ML) 0.083% nebulizer solution Take 3 mLs (2.5 mg total) by nebulization every 4 (four) hours as needed for wheezing or shortness of breath. 360 mL 5   albuterol  (VENTOLIN  HFA) 108 (90 Base) MCG/ACT inhaler Inhale 1-2 puffs into the lungs every 4 (four) hours as needed for wheezing or shortness of breath. 20.1 g 3   amoxicillin -clavulanate (AUGMENTIN ) 875-125 MG tablet Take 1 tablet by mouth 2 (two) times daily for 10 days. 20 tablet 0   atorvastatin  (LIPITOR)  40 MG tablet TAKE 1 TABLET(40 MG) BY MOUTH DAILY AT 6 PM (Patient taking differently: Take 40 mg by mouth every evening.) 90 tablet 3   budesonide  (PULMICORT ) 0.5 MG/2ML nebulizer solution Take 2 mLs (0.5 mg total) by nebulization 2 (two) times daily. 120 mL 11   buPROPion  (WELLBUTRIN  XL) 300 MG 24 hr tablet Take 300 mg by mouth daily.  1   citalopram  (CELEXA ) 40 MG tablet Take 40 mg by mouth daily.  0   clotrimazole -betamethasone  (LOTRISONE ) cream Apply 1 Application topically 2 (two) times daily. Apply to buttocks and left ankle & heel. (Patient taking differently: Apply 1 Application topically 2 (two) times daily as needed (sin irritation).) 45 g 2   cyclobenzaprine  (FLEXERIL ) 5 MG tablet Take 1 tablet (5 mg total) by mouth 3 (three) times daily as needed for muscle spasms. 30 tablet 5   diclofenac Sodium (VOLTAREN) 1 % GEL Apply 4 g topically 4 (four) times daily as needed. Rub on sore joints. 100 g 3   Dupilumab  (DUPIXENT ) 300 MG/2ML SOAJ Inject 300 mg into the skin every 14 (fourteen) days. (Patient taking differently: Inject 300 mg into the skin See admin instructions. Inject 2mL (300mg ) into the skin every 14 days - every other Monday.) 4 mL 3   Fluticasone -Umeclidin-Vilant (TRELEGY ELLIPTA ) 200-62.5-25 MCG/ACT AEPB Inhale 1 puff into the lungs every evening.     glipiZIDE  (GLUCOTROL  XL) 2.5 MG 24 hr tablet Take 1 tablet (2.5 mg total) by mouth as directed. Take 2 pills in the morning and 1 pill in the evening. 90 tablet 2   ipratropium-albuterol  (DUONEB) 0.5-2.5 (3) MG/3ML SOLN Take 3 mLs by nebulization 4 (four) times daily. 360 mL 11   metoprolol  succinate (TOPROL -XL) 25 MG 24 hr tablet Take 0.5 tablets (12.5 mg total) by mouth See admin instructions. Take 1/2 tablet (12.5mg ) by mouth once daily at noon.     olmesartan  (BENICAR ) 40 MG tablet Take 1 tablet (40 mg total) by mouth daily. (Patient taking differently: Take 40 mg by mouth See admin instructions. Take 1 tablet (40mg ) by mouth once  daily at noon.) 30 tablet 11   pantoprazole  (PROTONIX ) 40 MG tablet One tablet by mouth daily 30 to 60 minutes before the first meal of the day (Patient taking differently: Take 40 mg by mouth daily as needed (heartburn, acid reflux).) 90 tablet 1   predniSONE  (DELTASONE ) 10 MG tablet Take 1 tablet (  10 mg total) by mouth daily with breakfast. 30 tablet 3   predniSONE  (DELTASONE ) 10 MG tablet Take 4 tablets (40 mg total) by mouth daily for 3 days, THEN 3 tablets (30 mg total) daily for 3 days, THEN 2 tablets (20 mg total) daily for 3 days, THEN 1 tablet (10 mg total) daily for 3 days. Once taper complete, resume previous daily prednisone  therapy. 30 tablet 0   triamcinolone  ointment (KENALOG ) 0.5 % Apply topically 2 (two) times daily. (Patient taking differently: Apply 1 Application topically 2 (two) times daily as needed (skin irritaiton).) 30 g 11   No facility-administered medications prior to visit.    Review of Systems  Constitutional:  Negative for chills, fever, malaise/fatigue and weight loss.  HENT:  Negative for congestion, sinus pain and sore throat.   Eyes: Negative.   Respiratory:  Positive for shortness of breath. Negative for cough, hemoptysis and sputum production.   Cardiovascular:  Negative for chest pain, palpitations, orthopnea, claudication and leg swelling.  Gastrointestinal:  Negative for abdominal pain, heartburn, nausea and vomiting.  Genitourinary: Negative.   Musculoskeletal:  Negative for joint pain and myalgias.  Skin:  Negative for rash.  Neurological:  Negative for weakness.  Endo/Heme/Allergies: Negative.    Objective:   Vitals:   08/11/24 1307  BP: 119/66  Pulse: (!) 107  SpO2: 95%  Weight: 182 lb (82.6 kg)  Height: 5' 8 (1.727 m)    Physical Exam Constitutional:      General: He is not in acute distress. HENT:     Head: Normocephalic and atraumatic.  Eyes:     Conjunctiva/sclera: Conjunctivae normal.  Cardiovascular:     Rate and Rhythm:  Normal rate and regular rhythm.     Pulses: Normal pulses.     Heart sounds: Normal heart sounds. No murmur heard. Pulmonary:     Effort: Pulmonary effort is normal.     Breath sounds: No wheezing or rhonchi.  Musculoskeletal:     Right lower leg: No edema.     Left lower leg: No edema.  Neurological:     Mental Status: He is alert.    CBC    Component Value Date/Time   WBC 13.7 (H) 08/15/2024 1417   RBC 4.28 08/15/2024 1417   HGB 13.5 08/15/2024 1417   HCT 41.3 08/15/2024 1417   PLT 368 08/15/2024 1417   MCV 96.5 08/15/2024 1417   MCH 31.5 08/15/2024 1417   MCHC 32.7 08/15/2024 1417   RDW 14.0 08/15/2024 1417   LYMPHSABS 0.4 (L) 08/15/2024 1417   MONOABS 0.4 08/15/2024 1417   EOSABS 0.0 08/15/2024 1417   BASOSABS 0.0 08/15/2024 1417      Latest Ref Rng & Units 08/15/2024    2:17 PM 08/06/2024    1:35 AM 08/05/2024    6:55 AM  BMP  Glucose 70 - 99 mg/dL 783  817    BUN 8 - 23 mg/dL 30  24    Creatinine 9.38 - 1.24 mg/dL 8.99  8.65    Sodium 864 - 145 mmol/L 134  136  136   Potassium 3.5 - 5.1 mmol/L 4.6  4.5  4.5   Chloride 98 - 111 mmol/L 101  100    CO2 22 - 32 mmol/L 21  26    Calcium  8.9 - 10.3 mg/dL 8.9  8.5     Chest imaging: CT Chest 08/02/24 1. No pulmonary embolism. 2. Emphysema. Prior left upper lobectomy. Patchy new distal peribronchial ground-glass opacity in the  right upper lobe, favor acute infectious exacerbation - Bronchopneumonia.  CT Chest LCS 2024 Mediastinum/Nodes: No pathologically enlarged mediastinal or axillary lymph nodes. Hilar regions are difficult to definitively evaluate without IV contrast. There may be distal esophageal wall thickening which can be seen with gastroesophageal reflux disease.   Lungs/Pleura: Centrilobular and paraseptal emphysema. Left upper lobectomy. Calcified left lower lobe granuloma. No pleural fluid. Airway is otherwise unremarkable.  CXR 09/11/22 Heart size and mediastinal contours are unremarkable.  Postop change from left upper lobectomy. There is no pleural effusion or edema identified. No airspace opacities identified. The visualized osseous structures are notable for bilateral glenohumeral joint osteoarthritis and thoracic spondylosis.  PFT:    Latest Ref Rng & Units 09/11/2022    9:47 AM  PFT Results  FVC-Pre L 2.96   FVC-Predicted Pre % 70   FVC-Post L 3.22   FVC-Predicted Post % 76   Pre FEV1/FVC % % 54   Post FEV1/FCV % % 57   FEV1-Pre L 1.58   FEV1-Predicted Pre % 52   FEV1-Post L 1.83   DLCO uncorrected ml/min/mmHg 16.42   DLCO UNC% % 65   DLCO corrected ml/min/mmHg 16.42   DLCO COR %Predicted % 65   DLVA Predicted % 78   TLC L 6.78   TLC % Predicted % 96   RV % Predicted % 132     Labs:  Path:  Echo 03/24/22: LV EF 40-45%. RV systolic function and size is normal.   Heart Catheterization:  Assessment & Plan:   Asthma-COPD overlap syndrome [facility])  Discussion: Gary Wright is a 78 year old male, former smoker with CAD, hypertension, hyperlipidemia and lung cancer s/p LUL lobectomy 10/30/2018 who returns to pulmonary clinic for asthma-COPD.   Asthma-Chronic Obstructive Pulmonary Disease (COPD) - He continues to have frequent flairs in his breathing, recently exacerbated by pneumonia and bacteremia. Unclear source of the bacteremia at this time. - Complete steroid taper and then continue 10mg  daily - Complete course of augmentin  from hospital discharge - Continue trelegy ellipta  1 puff daily - Continue dupixent  injections - PRN albuterol  inhaler and nebulizer treatments - Consider pulmonary rehab or home physical therapy  Follow-up in 2 weeks  Dorn Chill, MD [facility] Office: [phone]   Current Outpatient Medications:    albuterol  (PROVENTIL ) (2.5 MG/3ML) 0.083% nebulizer solution, Take 3 mLs (2.5 mg total) by nebulization every 4 (four) hours as needed for wheezing or shortness of breath., Disp: 360 mL, Rfl: 5    albuterol  (VENTOLIN  HFA) 108 (90 Base) MCG/ACT inhaler, Inhale 1-2 puffs into the lungs every 4 (four) hours as needed for wheezing or shortness of breath., Disp: 20.1 g, Rfl: 3   atorvastatin  (LIPITOR) 40 MG tablet, TAKE 1 TABLET(40 MG) BY MOUTH DAILY AT 6 PM (Patient taking differently: Take 40 mg by mouth every evening.), Disp: 90 tablet, Rfl: 3   budesonide  (PULMICORT ) 0.5 MG/2ML nebulizer solution, Take 2 mLs (0.5 mg total) by nebulization 2 (two) times daily., Disp: 120 mL, Rfl: 11   buPROPion  (WELLBUTRIN  XL) 300 MG 24 hr tablet, Take 300 mg by mouth daily., Disp: , Rfl: 1   citalopram  (CELEXA ) 40 MG tablet, Take 40 mg by mouth daily., Disp: , Rfl: 0   clotrimazole -betamethasone  (LOTRISONE ) cream, Apply 1 Application topically 2 (two) times daily. Apply to buttocks and left ankle & heel. (Patient taking differently: Apply 1 Application topically 2 (two) times daily as needed (sin irritation).), Disp: 45 g, Rfl: 2   cyclobenzaprine  (FLEXERIL )  5 MG tablet, Take 1 tablet (5 mg total) by mouth 3 (three) times daily as needed for muscle spasms., Disp: 30 tablet, Rfl: 5   diclofenac Sodium (VOLTAREN) 1 % GEL, Apply 4 g topically 4 (four) times daily as needed. Rub on sore joints., Disp: 100 g, Rfl: 3   Dupilumab  (DUPIXENT ) 300 MG/2ML SOAJ, Inject 300 mg into the skin every 14 (fourteen) days. (Patient taking differently: Inject 300 mg into the skin See admin instructions. Inject 2mL (300mg ) into the skin every 14 days - every other Monday.), Disp: 4 mL, Rfl: 3   Fluticasone -Umeclidin-Vilant (TRELEGY ELLIPTA ) 200-62.5-25 MCG/ACT AEPB, Inhale 1 puff into the lungs every evening., Disp: , Rfl:    glipiZIDE  (GLUCOTROL  XL) 2.5 MG 24 hr tablet, Take 1 tablet (2.5 mg total) by mouth as directed. Take 2 pills in the morning and 1 pill in the evening., Disp: 90 tablet, Rfl: 2   ipratropium-albuterol  (DUONEB) 0.5-2.5 (3) MG/3ML SOLN, Take 3 mLs by nebulization 4 (four) times daily., Disp: 360 mL, Rfl: 11    metoprolol  succinate (TOPROL -XL) 25 MG 24 hr tablet, Take 0.5 tablets (12.5 mg total) by mouth See admin instructions. Take 1/2 tablet (12.5mg ) by mouth once daily at noon., Disp: , Rfl:    olmesartan  (BENICAR ) 40 MG tablet, Take 1 tablet (40 mg total) by mouth daily. (Patient taking differently: Take 40 mg by mouth See admin instructions. Take 1 tablet (40mg ) by mouth once daily at noon.), Disp: 30 tablet, Rfl: 11   pantoprazole  (PROTONIX ) 40 MG tablet, One tablet by mouth daily 30 to 60 minutes before the first meal of the day (Patient taking differently: Take 40 mg by mouth daily as needed (heartburn, acid reflux).), Disp: 90 tablet, Rfl: 1   predniSONE  (DELTASONE ) 10 MG tablet, Take 1 tablet (10 mg total) by mouth daily with breakfast., Disp: 30 tablet, Rfl: 3   predniSONE  (DELTASONE ) 10 MG tablet, Take 4 tablets (40 mg total) by mouth daily for 3 days, THEN 3 tablets (30 mg total) daily for 3 days, THEN 2 tablets (20 mg total) daily for 3 days, THEN 1 tablet (10 mg total) daily for 3 days. Once taper complete, resume previous daily prednisone  therapy., Disp: 30 tablet, Rfl: 0   triamcinolone  ointment (KENALOG ) 0.5 %, Apply topically 2 (two) times daily. (Patient taking differently: Apply 1 Application topically 2 (two) times daily as needed (skin irritaiton).), Disp: 30 g, Rfl: 11   furosemide  (LASIX ) 20 MG tablet, Take 1 tablet (20 mg total) by mouth daily., Disp: 14 tablet, Rfl: 0   HYDROcodone-acetaminophen  (NORCO/VICODIN) 5-325 MG tablet, Take 1 tablet by mouth every 6 (six) hours as needed., Disp: 14 tablet, Rfl: 0

## 2024-08-11 NOTE — Patient Instructions (Addendum)
 Continue trelegy ellipta  1 puff daily - rinse mouth out after each use  Use albuterol  inhaler 1-2 puffs every 4-6 hours as needed  Finish steroid taper and then continue 10mg  daily of prednisone  there after.   Complete augmentin  antibiotic from the hospital  Continue dupixent  injections  Follow up in 2 weeks, call sooner if needed

## 2024-08-12 ENCOUNTER — Ambulatory Visit (HOSPITAL_COMMUNITY)

## 2024-08-13 ENCOUNTER — Ambulatory Visit: Payer: Self-pay

## 2024-08-13 ENCOUNTER — Telehealth: Payer: Self-pay | Admitting: Family

## 2024-08-13 ENCOUNTER — Other Ambulatory Visit: Payer: Self-pay | Admitting: Family

## 2024-08-13 DIAGNOSIS — G8929 Other chronic pain: Secondary | ICD-10-CM

## 2024-08-13 NOTE — Telephone Encounter (Signed)
 He can take 2 flexeril  at a time 3 times per day. It is ok to take the Meloxicam  for a short duration - can take 7.5mg  twice a day until seen for his injection. Again, that is all I can do, I am sorry.

## 2024-08-13 NOTE — Telephone Encounter (Signed)
 Patient called stating he has been waiting on call from provider since being triaged this morning. Patient states he is in pain and can't get into the car. I informed patient that PCP is aware and will be addressing options available as soon as possible. Pt verbalized understanding. I offered to schedule pt w/ other provider at another office due to PCP being unavailable but pt declined. States only wants to see PCP.

## 2024-08-13 NOTE — Telephone Encounter (Signed)
 Pt verbalized understanding, no questions or concerns.

## 2024-08-13 NOTE — Telephone Encounter (Signed)
 I called and spoke with pt. Pt refused Meloxicam  due to being told he should not take this. Pt also refused flexeril  but does have this on hand, Pt states this does not help sx. I advised pt he would need to go to the ED, I asked pt if wife could take him since he refused EMS and wife stated yall are crazy. Pt refused ED.

## 2024-08-13 NOTE — Telephone Encounter (Signed)
 FYI Only or Action Required?: FYI only for provider.  Patient was last seen in primary care on 08/09/2024 by Lucius Krabbe, NP.  Called Nurse Triage reporting Hip Pain.  Symptoms began several years ago.  Interventions attempted: OTC medications: tylenol .  Symptoms are: gradually worsening.  Triage Disposition: See HCP Within 4 Hours (Or PCP Triage)  Patient/caregiver understands and will follow disposition?: No, refuses disposition       Copied from CRM [id number]. Topic: Clinical - Red Word Triage >> Aug 13, 2024  9:00 AM Thersia BROCKS wrote: [facility] that prompted transfer to Nurse Triage: terrible pain in his hip , back doctor is not in , stated he is so much pain he has to sleep sitting up Reason for Disposition  [1] SEVERE pain (e.g., excruciating, unable to do any normal activities) AND [2] not improved after 2 hours of pain medicine  Answer Assessment - Initial Assessment Questions Pt states his back doctor is out. He states he just needs stronger pain medication to get him through to the 4th because he isn't getting much sleep because of the pain. He states they gave him hydrocodone when he was in the hospital and that helped. PT is having his 4th epidural on the 4th. Rn attempted to schedule patient and he stated that's the thing, I'm in so much pain I can't even get in the car to go to an appt.    1. LOCATION and RADIATION: Where is the pain located? Does the pain spread (shoot) anywhere else?     Left  hip and leg 2. QUALITY: What does the pain feel like?  (e.g., sharp, dull, aching, burning)     Throbbing , any movement sharp 3. SEVERITY: How bad is the pain? What does it keep you from doing?   (Scale 1-10; or mild, moderate, severe)     10 4. ONSET: When did the pain start? Does it come and go, or is it there all the time?     ongoing 5. WORK OR EXERCISE: Has there been any recent work or exercise that involved this part of the body?      no 6.  CAUSE: What do you think is causing the hip pain?      Rheumatoid arthritis 7. AGGRAVATING FACTORS: What makes the hip pain worse? (e.g., walking, climbing stairs, running)     *No Answer* 8. OTHER SYMPTOMS: Do you have any other symptoms? (e.g., back pain, pain shooting down leg,  fever, rash)     Pain going down leg  Protocols used: Hip Pain-A-AH

## 2024-08-13 NOTE — Telephone Encounter (Signed)
 Let him know I am so sorry but the hydrocodone is not safe with his lung disease as it can worsen his symptoms and cause respiratory failure - they can give this in the hospital because he is monitored. All I can do is the Meloxicam  and Flexeril  - does he have these on hand? if not I can send or he goes in to the ED due to the pain.

## 2024-08-13 NOTE — Telephone Encounter (Signed)
 Left hip pain is a 10 Pt states he has has issues with sciatic nerve and it is radiating down this whole leg Patient is sleeping sitting up Patient refuses Urgent Care Patient also states that he just got out of the hospital and he is not going back Patient states that his pain is so severe that he can hardly walk Patient is advised that the ER would be recommended at this time for his symptoms.  Patient states that he was told that [facility] couldn't write him a prescription for pain medicine but a doctor could so he wanted to make an appointment Monday with a Doctor at the office. This RN called [facility] and advised them that the patient was refusing Urgent Care or the ER at this time and they advised that they had already spoken to him and the ER was still the recommendation. Patient also still was refusing earlier recommended medications such as Meloxicam  or Flexeril . This RN advised the patient that I would send this message through to his provider but it was recommended that he goes to the Emergency Room if he is in severe pain.  Patient states he isn't going to the ER and that he wants a call back from [place] or someone else at the office.   FYI Only or Action Required?: Action required by provider: request for appointment, clinical question for provider, update on patient condition, and patient wants to see a MD in the office.  Patient was last seen in primary care on 08/09/2024 by Lucius [facility], NP.  Called Nurse Triage reporting Pain.  Triage Disposition: See HCP Within 4 Hours (Or PCP Triage)  Patient/caregiver understands and will follow disposition?: No, wishes to speak with PCP                   Copied from CRM #[id number]. Topic: Clinical - Red Word Triage >> Aug 13, 2024  3:14 PM Sasha M wrote: Red Word that prompted transfer to Nurse Triage: severe pain from rheumatoid arthritis, left hip, 10/10 pain scale Reason for Disposition  [1] SEVERE pain (e.g.,  excruciating, unable to do any normal activities) AND [2] not improved after 2 hours of pain medicine  Answer Assessment - Initial Assessment Questions Left hip pain is a 10 Pt states he has has issues with sciatic nerve and it is radiating down this whole leg Patient is sleeping sitting up Patient refuses Urgent Care Patient also states that he just got out of the hospital and he is not going back Patient states that his pain is so severe that he can hardly walk Patient is advised that the ER would be recommended at this time for his symptoms.  Patient states that he was told that [facility] couldn't write him a prescription for pain medicine but a doctor could so he wanted to make an appointment Monday with a Doctor at the office. This RN called [facility] and advised them that the patient was refusing Urgent Care or the ER at this time and they advised that they had already spoken to him and the ER was still the recommendation. Patient also still was refusing earlier recommended medications such as Meloxicam  or Flexeril . This RN advised the patient that I would send this message through to his provider but it was recommended that he goes to the Emergency Room if he is in severe pain.  Patient states he isn't going to the ER and that he wants a call back from [place] or someone else at the  office.  Protocols used: Hip Pain-A-AH

## 2024-08-15 ENCOUNTER — Encounter (HOSPITAL_COMMUNITY): Payer: Self-pay | Admitting: Emergency Medicine

## 2024-08-15 ENCOUNTER — Other Ambulatory Visit: Payer: Self-pay

## 2024-08-15 ENCOUNTER — Emergency Department (HOSPITAL_COMMUNITY)

## 2024-08-15 ENCOUNTER — Telehealth (HOSPITAL_COMMUNITY): Payer: Self-pay

## 2024-08-15 ENCOUNTER — Emergency Department (HOSPITAL_COMMUNITY)
Admission: EM | Admit: 2024-08-15 | Discharge: 2024-08-15 | Disposition: A | Discharge status: Home / Self Care | Attending: Emergency Medicine | Admitting: Emergency Medicine

## 2024-08-15 DIAGNOSIS — I2510 Atherosclerotic heart disease of native coronary artery without angina pectoris: Secondary | ICD-10-CM | POA: Diagnosis not present

## 2024-08-15 DIAGNOSIS — M25552 Pain in left hip: Secondary | ICD-10-CM

## 2024-08-15 DIAGNOSIS — J449 Chronic obstructive pulmonary disease, unspecified: Secondary | ICD-10-CM | POA: Diagnosis not present

## 2024-08-15 DIAGNOSIS — M7989 Other specified soft tissue disorders: Secondary | ICD-10-CM | POA: Insufficient documentation

## 2024-08-15 DIAGNOSIS — M5442 Lumbago with sciatica, left side: Secondary | ICD-10-CM | POA: Diagnosis not present

## 2024-08-15 DIAGNOSIS — G8929 Other chronic pain: Secondary | ICD-10-CM

## 2024-08-15 DIAGNOSIS — J3489 Other specified disorders of nose and nasal sinuses: Secondary | ICD-10-CM | POA: Diagnosis not present

## 2024-08-15 DIAGNOSIS — Z7951 Long term (current) use of inhaled steroids: Secondary | ICD-10-CM | POA: Insufficient documentation

## 2024-08-15 DIAGNOSIS — I10 Essential (primary) hypertension: Secondary | ICD-10-CM | POA: Diagnosis not present

## 2024-08-15 DIAGNOSIS — R600 Localized edema: Secondary | ICD-10-CM | POA: Diagnosis not present

## 2024-08-15 DIAGNOSIS — Z79899 Other long term (current) drug therapy: Secondary | ICD-10-CM | POA: Insufficient documentation

## 2024-08-15 LAB — COMPREHENSIVE METABOLIC PANEL WITH GFR
ALT: 27 U/L (ref 0–44)
AST: 18 U/L (ref 15–41)
Albumin: 3.4 g/dL — ABNORMAL LOW (ref 3.5–5.0)
Alkaline Phosphatase: 78 U/L (ref 38–126)
Anion gap: 12 (ref 5–15)
BUN: 30 mg/dL — ABNORMAL HIGH (ref 8–23)
CO2: 21 mmol/L — ABNORMAL LOW (ref 22–32)
Calcium: 8.9 mg/dL (ref 8.9–10.3)
Chloride: 101 mmol/L (ref 98–111)
Creatinine, Ser: 1 mg/dL (ref 0.61–1.24)
GFR, Estimated: >60 mL/min (ref >60)
Glucose, Bld: 216 mg/dL — ABNORMAL HIGH (ref 70–99)
Potassium: 4.6 mmol/L (ref 3.5–5.1)
Sodium: 134 mmol/L — ABNORMAL LOW (ref 135–145)
Total Bilirubin: 0.7 mg/dL (ref 0.0–1.2)
Total Protein: 6 g/dL — ABNORMAL LOW (ref 6.5–8.1)

## 2024-08-15 LAB — CBC WITH DIFFERENTIAL/PLATELET
Abs Immature Granulocytes: 0.15 K/uL — ABNORMAL HIGH (ref 0.00–0.07)
Basophils Absolute: 0 K/uL (ref 0.0–0.1)
Basophils Relative: 0 %
Eosinophils Absolute: 0 K/uL (ref 0.0–0.5)
Eosinophils Relative: 0 %
HCT: 41.3 % (ref 39.0–52.0)
Hemoglobin: 13.5 g/dL (ref 13.0–17.0)
Immature Granulocytes: 1 %
Lymphocytes Relative: 3 %
Lymphs Abs: 0.4 K/uL — ABNORMAL LOW (ref 0.7–4.0)
MCH: 31.5 pg (ref 26.0–34.0)
MCHC: 32.7 g/dL (ref 30.0–36.0)
MCV: 96.5 fL (ref 80.0–100.0)
Monocytes Absolute: 0.4 K/uL (ref 0.1–1.0)
Monocytes Relative: 3 %
Neutro Abs: 12.7 K/uL — ABNORMAL HIGH (ref 1.7–7.7)
Neutrophils Relative %: 93 %
Platelets: 368 K/uL (ref 150–400)
RBC: 4.28 MIL/uL (ref 4.22–5.81)
RDW: 14 % (ref 11.5–15.5)
WBC: 13.7 K/uL — ABNORMAL HIGH (ref 4.0–10.5)
nRBC: 0 % (ref 0.0–0.2)

## 2024-08-15 LAB — BRAIN NATRIURETIC PEPTIDE: B Natriuretic Peptide: 51.2 pg/mL (ref 0.0–100.0)

## 2024-08-15 MED ORDER — HYDROCODONE-ACETAMINOPHEN 5-325 MG PO TABS: 1.0000 | ORAL_TABLET | Freq: Four times a day (QID) | ORAL | 0 refills | Status: DC | PRN

## 2024-08-15 MED ORDER — FUROSEMIDE 20 MG PO TABS: 20.0000 mg | ORAL_TABLET | Freq: Every day | ORAL | 0 refills | Status: DC

## 2024-08-15 MED ORDER — HYDROCODONE-ACETAMINOPHEN 5-325 MG PO TABS
1.0000 | ORAL_TABLET | Freq: Once | ORAL | Status: AC
  Administered 2024-08-15: 1 via ORAL
  Filled 2024-08-15: qty 1

## 2024-08-15 MED ORDER — ONDANSETRON 4 MG PO TBDP
4.0000 mg | ORAL_TABLET | Freq: Once | ORAL | Status: AC
  Administered 2024-08-15: 4 mg via ORAL
  Filled 2024-08-15: qty 1

## 2024-08-15 NOTE — Discharge Instructions (Signed)
 Follow-up back up with sports medicine regarding the avascular necrosis of the left hip.  May need to be referred for consideration for left hip replacement.  CT of the lumbar back shows a lot of narrowing and certainly findings consistent with your left sciatica.  Would recommend consultation with neurology regarding this.  For the leg swelling trial of Lasix  low-dose just to help with the swelling no other acute findings related to the leg swelling.  Return for any bilateral leg pain or weakness or numbness or any incontinence.  This could be a sign significant problem with the low back.  Take the pain medicine as directed.

## 2024-08-15 NOTE — ED Provider Triage Note (Signed)
 Emergency Medicine Provider Triage Evaluation Note  Gary Wright , a 78 y.o. male  was evaluated in triage.  Pt complains of 2 concerns.  1 is worsening left hip pain low back pain with sciatica into the left leg.  And weakness.  No incontinence.  No right lower extremity involvement.  No fall or trauma.  Patient followed by sports medicine.  They are expecting to do an epidural early November.  Patient currently not on any pain medicine.  Chart review shows that he has been in contact with his primary care doctor they were reluctant to start narcotics.  The other concern is she has had bilateral leg swelling patient had recent admission on October 16 acute respiratory failure with hypoxia.  Known to have COPD not on oxygen.  Patient has bilateral lower extremity swelling.  Denies chest pain denies significant shortness of breath.  Oxygen saturation here 100% on room air.  Review of Systems  Positive: Leg swelling Negative: Chest pain, shortness of breath  Physical Exam  BP (!) 159/94   Pulse (!) 109   Temp 97.8 F (36.6 C) (Oral)   Resp 18   Ht 1.727 m (5' 8)   Wt 82.5 kg   SpO2 100%   BMI 27.65 kg/m  Gen:  Awake, no distress   Resp: Normal effort bilateral rhonchi MSK:  Moves extremities without difficulty but pain with movement of the left leg.  Some decrease sensation some decreased strength to the foot.  Right lower extremity normal.  Bilateral lower extremity edema. Other:    Medical Decision Making  Medically screening exam initiated at 1:50 PM.  Appropriate orders placed.  [name] was informed that the remainder of the evaluation will be completed by another provider, this initial triage assessment does not replace that evaluation, and the importance of remaining in the ED until their evaluation is complete.  Patient once the lower extremity edema worked up.  Ordered BMP CBC complete metabolic panel EKG will get chest x-ray will also get CT lumbar spine he has not had  that evaluated since May.  And will get x-ray of the left hip.  Will give some oral pain medicine.   Gurman Ashland, MD 08/15/24 1415

## 2024-08-15 NOTE — ED Triage Notes (Signed)
 Pt arrives via EMS from home with reports of left back and hip pain that radiates down leg. Also reports generalized weakness and bilateral leg swelling for a week.

## 2024-08-15 NOTE — ED Provider Notes (Signed)
 [place] EMERGENCY DEPARTMENT AT [facility] Provider Note   CSN: [id number] Arrival date & time: 08/15/24  1332     Patient presents with: Weakness and Hip Pain   Gary Wright is a 78 y.o. male.   Patient seen by me in triage.  Patient coming in with left back pain with pain radiating down the left leg with some weakness.  No pain radiating down the right leg.  Also a lot of left hip pain.  And patient has been worried about bilateral leg swellings been ongoing for a week.  Has bad COPD does not use oxygen at home.  Has not had leg swelling before.  It is bilateral leg swelling.  Patient is followed by sports medicine he is due for an epidural early part in November by them.  Patient also seen October 24 known to have chronic bilateral hip pain and low back pain.  Patient is not on any narcotic pain medicines.  Denies any shortness of breath or chest pain.  No incontinence.  Past medical history for COPD hypertension coronary artery disease patient is a former smoker quit in 2019.       Prior to Admission medications   Medication Sig Start Date End Date Taking? Authorizing Provider  furosemide  (LASIX ) 20 MG tablet Take 1 tablet (20 mg total) by mouth daily. 08/15/24  Yes Taeshawn Helfman, MD  HYDROcodone-acetaminophen  (NORCO/VICODIN) 5-325 MG tablet Take 1 tablet by mouth every 6 (six) hours as needed. 08/15/24  Yes Raaga Maeder, Rocky SAILOR, PA-C  albuterol  (PROVENTIL ) (2.5 MG/3ML) 0.083% nebulizer solution Take 3 mLs (2.5 mg total) by nebulization every 4 (four) hours as needed for wheezing or shortness of breath. 06/09/24   [name] Dorn NOVAK, MD  albuterol  (VENTOLIN  HFA) 108 (90 Base) MCG/ACT inhaler Inhale 1-2 puffs into the lungs every 4 (four) hours as needed for wheezing or shortness of breath. 06/09/24   [name] Dorn NOVAK, MD  atorvastatin  (LIPITOR) 40 MG tablet TAKE 1 TABLET(40 MG) BY MOUTH DAILY AT 6 PM Patient taking differently: Take 40 mg by mouth every evening. 08/18/23    Pietro Redell RAMAN, MD  budesonide  (PULMICORT ) 0.5 MG/2ML nebulizer solution Take 2 mLs (0.5 mg total) by nebulization 2 (two) times daily. 01/13/24   [name] Dorn NOVAK, MD  buPROPion  (WELLBUTRIN  XL) 300 MG 24 hr tablet Take 300 mg by mouth daily. 05/11/16   [provider]  citalopram  (CELEXA ) 40 MG tablet Take 40 mg by mouth daily. 06/01/16   [provider]  clotrimazole -betamethasone  (LOTRISONE ) cream Apply 1 Application topically 2 (two) times daily. Apply to buttocks and left ankle & heel. Patient taking differently: Apply 1 Application topically 2 (two) times daily as needed (sin irritation). 02/05/24   Lucius Krabbe, NP  cyclobenzaprine  (FLEXERIL ) 5 MG tablet Take 1 tablet (5 mg total) by mouth 3 (three) times daily as needed for muscle spasms. 03/23/24   Lucius Krabbe, NP  diclofenac Sodium (VOLTAREN) 1 % GEL Apply 4 g topically 4 (four) times daily as needed. Rub on sore joints. 08/09/24   Lucius Krabbe, NP  Dupilumab  (DUPIXENT ) 300 MG/2ML SOAJ Inject 300 mg into the skin every 14 (fourteen) days. Patient taking differently: Inject 300 mg into the skin See admin instructions. Inject 2mL (300mg ) into the skin every 14 days - every other Monday. 07/21/24   [name] Dorn NOVAK, MD  Fluticasone -Umeclidin-Vilant (TRELEGY ELLIPTA ) 200-62.5-25 MCG/ACT AEPB Inhale 1 puff into the lungs every evening.    [provider]  glipiZIDE  (GLUCOTROL  XL)  2.5 MG 24 hr tablet Take 1 tablet (2.5 mg total) by mouth as directed. Take 2 pills in the morning and 1 pill in the evening. 08/09/24   Lucius Krabbe, NP  ipratropium-albuterol  (DUONEB) 0.5-2.5 (3) MG/3ML SOLN Take 3 mLs by nebulization 4 (four) times daily. 12/22/23   Darlean Ozell NOVAK, MD  metoprolol  succinate (TOPROL -XL) 25 MG 24 hr tablet Take 0.5 tablets (12.5 mg total) by mouth See admin instructions. Take 1/2 tablet (12.5mg ) by mouth once daily at noon. 08/06/24   Arlon Carliss ORN, DO  olmesartan  (BENICAR ) 40 MG  tablet Take 1 tablet (40 mg total) by mouth daily. Patient taking differently: Take 40 mg by mouth See admin instructions. Take 1 tablet (40mg ) by mouth once daily at noon. 06/29/24 06/29/25  Darlean Ozell NOVAK, MD  pantoprazole  (PROTONIX ) 40 MG tablet One tablet by mouth daily 30 to 60 minutes before the first meal of the day Patient taking differently: Take 40 mg by mouth daily as needed (heartburn, acid reflux). 05/26/24   Darlean Ozell NOVAK, MD  predniSONE  (DELTASONE ) 10 MG tablet Take 1 tablet (10 mg total) by mouth daily with breakfast. 07/20/24   [name] Dorn NOVAK, MD  predniSONE  (DELTASONE ) 10 MG tablet Take 4 tablets (40 mg total) by mouth daily for 3 days, THEN 3 tablets (30 mg total) daily for 3 days, THEN 2 tablets (20 mg total) daily for 3 days, THEN 1 tablet (10 mg total) daily for 3 days. Once taper complete, resume previous daily prednisone  therapy. 08/06/24 08/18/24  Arlon Carliss ORN, DO  triamcinolone  ointment (KENALOG ) 0.5 % Apply topically 2 (two) times daily. Patient taking differently: Apply 1 Application topically 2 (two) times daily as needed (skin irritaiton). 06/24/24   Lucius Krabbe, NP    Allergies: Patient has no known allergies.    Review of Systems  Constitutional:  Negative for chills and fever.  HENT:  Negative for ear pain and sore throat.   Eyes:  Negative for pain and visual disturbance.  Respiratory:  Negative for cough and shortness of breath.   Cardiovascular:  Positive for leg swelling. Negative for chest pain and palpitations.  Gastrointestinal:  Negative for abdominal pain and vomiting.  Genitourinary:  Negative for dysuria and hematuria.  Musculoskeletal:  Positive for back pain. Negative for arthralgias.  Skin:  Negative for color change and rash.  Neurological:  Positive for weakness. Negative for seizures and syncope.  All other systems reviewed and are negative.   Updated Vital Signs BP (!) 159/94   Pulse (!) 109   Temp 97.8 F (36.6 C) (Oral)    Resp 18   Ht 1.727 m (5' 8)   Wt 82.5 kg   SpO2 100%   BMI 27.65 kg/m   Physical Exam Vitals and nursing note reviewed.  Constitutional:      General: He is not in acute distress.    Appearance: Normal appearance. He is well-developed.  HENT:     Head: Normocephalic and atraumatic.     Mouth/Throat:     Mouth: Mucous membranes are moist.  Eyes:     Conjunctiva/sclera: Conjunctivae normal.  Cardiovascular:     Rate and Rhythm: Normal rate and regular rhythm.     Heart sounds: No murmur heard. Pulmonary:     Effort: Pulmonary effort is normal. No respiratory distress.     Breath sounds: Rhonchi present. No wheezing or rales.  Abdominal:     Palpations: Abdomen is soft.     Tenderness: There is no  abdominal tenderness.  Musculoskeletal:        General: No swelling.     Cervical back: Normal range of motion and neck supple.     Right lower leg: Edema present.     Left lower leg: Edema present.     Comments: Pain in the left hip with movement of the left lower extremity.  Neurovascularly intact bilaterally.  Questionably some weakness to the left leg.  Skin:    General: Skin is warm and dry.     Capillary Refill: Capillary refill takes less than 2 seconds.  Neurological:     Mental Status: He is alert.  Psychiatric:        Mood and Affect: Mood normal.     (all labs ordered are listed, but only abnormal results are displayed) Labs Reviewed  CBC WITH DIFFERENTIAL/PLATELET - Abnormal; Notable for the following components:      Result Value   WBC 13.7 (*)    Neutro Abs 12.7 (*)    Lymphs Abs 0.4 (*)    Abs Immature Granulocytes 0.15 (*)    All other components within normal limits  COMPREHENSIVE METABOLIC PANEL WITH GFR - Abnormal; Notable for the following components:   Sodium 134 (*)    CO2 21 (*)    Glucose, Bld 216 (*)    BUN 30 (*)    Total Protein 6.0 (*)    Albumin 3.4 (*)    All other components within normal limits  BRAIN NATRIURETIC PEPTIDE     EKG: EKG Interpretation Date/Time:  Sunday August 15 2024 14:15:26 EDT Ventricular Rate:  109 PR Interval:  146 QRS Duration:  120 QT Interval:  374 QTC Calculation: 503 R Axis:   -53  Text Interpretation: Sinus tachycardia Right bundle branch block Left anterior fascicular block Bifascicular block Abnormal ECG When compared with ECG of 05-Aug-2024 06:43, PREVIOUS ECG IS PRESENT No significant change since last tracing Confirmed by Wrangler Penning [id number]) on 08/15/2024 2:22:30 PM  Radiology: DG Chest 2 View Result Date: 08/15/2024 CLINICAL DATA:  Bilateral lower extremity swelling and worsening left hip pain. EXAM: DG CHEST 2V COMPARISON:  08/05/2024. FINDINGS: Trachea is midline. Heart size normal. Lungs are clear. No pleural fluid. Bilateral shoulder osteoarthritis, left greater than right. IMPRESSION: No acute findings. Electronically Signed   By: Newell Eke M.D.   On: 08/15/2024 15:40   DG Hip Unilat W or Wo Pelvis 2-3 Views Left Result Date: 08/15/2024 CLINICAL DATA:  Worsening left hip pain. EXAM: DG HIP (WITH OR WITHOUT PELVIS) 2-3V LEFT COMPARISON:  01/29/2024. FINDINGS: Sclerosis and marked flattening of the left femoral head with lateral subluxation. Findings have progressed markedly from 01/29/2024. Superior joint space narrowing in the right hip with subchondral sclerosis, as on 01/29/2024. Sacroiliac joints are patent. IMPRESSION: 1. Avascular necrosis in the left femoral head with significant collapse, mild lateral subluxation and secondary osteoarthritis, largely new from 01/29/2024. 2. Right hip osteoarthritis. Electronically Signed   By: Newell Eke M.D.   On: 08/15/2024 15:39   CT Lumbar Spine Wo Contrast Result Date: 08/15/2024 EXAM: CT OF THE LUMBAR SPINE WITHOUT CONTRAST 08/15/2024 02:45:17 PM TECHNIQUE: CT of the lumbar spine was performed without the administration of intravenous contrast. Multiplanar reformatted images are provided for review. Automated  exposure control, iterative reconstruction, and/or weight based adjustment of the mA/kV was utilized to reduce the radiation dose to as low as reasonably achievable. COMPARISON: None available. CLINICAL HISTORY: FINDINGS: BONES AND ALIGNMENT: Transitional anatomy is present. Partial sacralization of  the L5 vertebral body is noted. This was previously labeled L5. The change in nomenclature is based on comparison with chest imaging. The grade 1 anterolisthesis is therefore at L4-L5. Slight retrolisthesis is present at L1-L2, L2-L3, and L3-L4. No acute fracture or suspicious bone lesion. Not rapid curvature centered at L2. Compensatory leftward curvature is present at L5. Endplate sclerotic changes are worse right than left at L4-L5. DEGENERATIVE CHANGES: * **L1-L2:** Disc bulging and facet hypertrophy are stable without significant stenosis. * **L2-L3:** A broad-based disc protrusion and moderate facet hypertrophy are present. Moderate foraminal narrowing is worse on the left. * **L3-L4:** A broad-based disc protrusion and moderate facet hypertrophy are present. Moderate subarticular and foraminal narrowing is worse on the right. * **L4-L5:** A broad-based disc protrusion and advanced facet hypertrophy result in severe central and bilateral foraminal stenosis. * **L5-S1:** A broad-based disc protrusion is present. Laminectomy decompression of the posterior canal. Moderate residual foraminal stenosis is present bilaterally. * **S1:** Facet hypertrophy is present. Laminectomy decompression of the spinal canal. The foramina are patent bilaterally. SOFT TISSUES: Atherosclerotic calcifications are present in the aorta and branch vessels without aneurysm. A 13 mm cystic lesion is present near the lower pole of the right kidney, incompletely characterized. No acute abnormality. IMPRESSION: 1. Grade 1 anterolisthesis at L4-5 with broad-based disc protrusion and advanced facet hypertrophy resulting in severe central and  bilateral foraminal stenosis. 2. Broad-based disc protrusion at L5-S1 with prior laminectomy decompression and moderate residual foraminal stenosis bilaterally. 3. 13 mm cystic lesion near the lower pole of the right kidney that is incompletely characterized. Renal ultrasound may be useful for further evaluation as clinically indicated. 4. Transitional anatomy The last fully formed vertebral body is S1. This is a change from the previous MRI. Electronically signed by: Lonni Necessary MD 08/15/2024 03:01 PM EDT RP Workstation: [id number]     Procedures   Medications Ordered in the ED  HYDROcodone-acetaminophen  (NORCO/VICODIN) 5-325 MG per tablet 1 tablet (1 tablet Oral Given 08/15/24 1423)  ondansetron  (ZOFRAN -ODT) disintegrating tablet 4 mg (4 mg Oral Given 08/15/24 1423)                                    Medical Decision Making Amount and/or Complexity of Data Reviewed Labs: ordered. Radiology: ordered.  Risk Prescription drug management.   Patient's CBC white count is 13.7 hemoglobin 13.5 platelets 368.  White count may be elevated secondary to being on steroids.  This may be also be the cause of his leg swelling.  Complete metabolic panel sodium 134 glucose 216 renal function GFR is greater than 60 LFTs normal.  Electrolytes otherwise normal.  Anion gap normal.  BNP not elevated to 51.2.  Two-view chest x-ray no acute findings.  X-ray of the of the left hip avascular porosis left femoral head with significant collapse.  A lot of this is new since April 2025 so this is worse.  CT scan of the lumbar spine grade 1 after thesis at L5 L4 broad-based disc protrusion advanced facet hypertrophy resulting in severe canal and bilateral foraminal sclerosis.  Will make a referral to neurosurgery for this.  Broad breast disc protrusion at L5-S1 with prior laminectomy decompression and moderate residual foraminal stenosis bilaterally.  A 13 mm cystic lesion near the lower pole of the right  kidney that is incompletely characterized renal ultrasound may be useful for further evaluation as clinically indicated.  Final diagnoses:  Left  hip pain  Chronic midline low back pain with left-sided sciatica  Leg swelling    ED Discharge Orders          Ordered    furosemide  (LASIX ) 20 MG tablet  Daily        08/15/24 1608    HYDROcodone-acetaminophen  (NORCO/VICODIN) 5-325 MG tablet  Every 6 hours PRN        08/15/24 1610               Kegan Mckeithan, MD 08/15/24 1628

## 2024-08-15 NOTE — Telephone Encounter (Cosign Needed)
 At Dr. Nolon request, I prescribed Norco for this patient, 1 tablet to be used as needed every 6hrs for breakthrough pain, with a total of 14 tablets being sent in. I did not personally evaluate this patient, as he was seen and assessed by Dr. Zackowski who was unable to prescribe pain medication for this patient due to issue with online prescribing barrier.

## 2024-08-16 ENCOUNTER — Encounter: Payer: Self-pay | Admitting: Pulmonary Disease

## 2024-08-16 ENCOUNTER — Telehealth (HOSPITAL_COMMUNITY): Payer: Self-pay

## 2024-08-16 ENCOUNTER — Telehealth: Payer: Self-pay | Admitting: Sports Medicine

## 2024-08-16 NOTE — Telephone Encounter (Signed)
 Patient called stating that he was having hip pain and went to the ED. He was told that he would need a total hip replacement and was referred to neurosurgery. He was also given a prescription for Hydrocodone for a few days and advised to call our office for a refill until he is able to be seen by the neurosurgeon.  Please advise.

## 2024-08-16 NOTE — Telephone Encounter (Signed)
 I reached out to pt in regards to message below. Pt received recommendations from PCP and verbalized understanding.

## 2024-08-16 NOTE — Telephone Encounter (Signed)
 Per Ronal, this patient cancelled orientation on 9/22 due to back pain and has had multiple ED visits since, stated referral should be closed.  Closing referral.

## 2024-08-17 ENCOUNTER — Ambulatory Visit (HOSPITAL_COMMUNITY)

## 2024-08-18 ENCOUNTER — Emergency Department (HOSPITAL_COMMUNITY)

## 2024-08-18 ENCOUNTER — Inpatient Hospital Stay (HOSPITAL_COMMUNITY)
Admission: EM | Admit: 2024-08-18 | Discharge: 2024-08-31 | DRG: 470 | Disposition: A | Discharge status: Skilled Nursing Facility | Attending: Internal Medicine | Admitting: Internal Medicine

## 2024-08-18 ENCOUNTER — Encounter (HOSPITAL_COMMUNITY): Payer: Self-pay

## 2024-08-18 ENCOUNTER — Other Ambulatory Visit: Payer: Self-pay

## 2024-08-18 ENCOUNTER — Telehealth: Payer: Self-pay | Admitting: Family Medicine

## 2024-08-18 DIAGNOSIS — F419 Anxiety disorder, unspecified: Secondary | ICD-10-CM | POA: Diagnosis present

## 2024-08-18 DIAGNOSIS — D72829 Elevated white blood cell count, unspecified: Secondary | ICD-10-CM | POA: Diagnosis not present

## 2024-08-18 DIAGNOSIS — K219 Gastro-esophageal reflux disease without esophagitis: Secondary | ICD-10-CM | POA: Diagnosis present

## 2024-08-18 DIAGNOSIS — R7303 Prediabetes: Secondary | ICD-10-CM

## 2024-08-18 DIAGNOSIS — S72002A Fracture of unspecified part of neck of left femur, initial encounter for closed fracture: Secondary | ICD-10-CM

## 2024-08-18 DIAGNOSIS — Z981 Arthrodesis status: Secondary | ICD-10-CM

## 2024-08-18 DIAGNOSIS — N179 Acute kidney failure, unspecified: Secondary | ICD-10-CM | POA: Diagnosis present

## 2024-08-18 DIAGNOSIS — E8809 Other disorders of plasma-protein metabolism, not elsewhere classified: Secondary | ICD-10-CM | POA: Diagnosis not present

## 2024-08-18 DIAGNOSIS — Z902 Acquired absence of lung [part of]: Secondary | ICD-10-CM

## 2024-08-18 DIAGNOSIS — E871 Hypo-osmolality and hyponatremia: Secondary | ICD-10-CM | POA: Diagnosis present

## 2024-08-18 DIAGNOSIS — M879 Osteonecrosis, unspecified: Principal | ICD-10-CM | POA: Diagnosis present

## 2024-08-18 DIAGNOSIS — Z751 Person awaiting admission to adequate facility elsewhere: Secondary | ICD-10-CM

## 2024-08-18 DIAGNOSIS — D649 Anemia, unspecified: Secondary | ICD-10-CM | POA: Diagnosis not present

## 2024-08-18 DIAGNOSIS — J4489 Other specified chronic obstructive pulmonary disease: Secondary | ICD-10-CM | POA: Diagnosis present

## 2024-08-18 DIAGNOSIS — E099 Drug or chemical induced diabetes mellitus without complications: Secondary | ICD-10-CM

## 2024-08-18 DIAGNOSIS — M87052 Idiopathic aseptic necrosis of left femur: Secondary | ICD-10-CM

## 2024-08-18 DIAGNOSIS — R000 Tachycardia, unspecified: Secondary | ICD-10-CM | POA: Diagnosis not present

## 2024-08-18 DIAGNOSIS — M25559 Pain in unspecified hip: Secondary | ICD-10-CM | POA: Diagnosis present

## 2024-08-18 DIAGNOSIS — Z87891 Personal history of nicotine dependence: Secondary | ICD-10-CM

## 2024-08-18 DIAGNOSIS — Z7984 Long term (current) use of oral hypoglycemic drugs: Secondary | ICD-10-CM

## 2024-08-18 DIAGNOSIS — E1165 Type 2 diabetes mellitus with hyperglycemia: Secondary | ICD-10-CM | POA: Diagnosis present

## 2024-08-18 DIAGNOSIS — I5032 Chronic diastolic (congestive) heart failure: Secondary | ICD-10-CM | POA: Diagnosis present

## 2024-08-18 DIAGNOSIS — M7989 Other specified soft tissue disorders: Secondary | ICD-10-CM

## 2024-08-18 DIAGNOSIS — E663 Overweight: Secondary | ICD-10-CM | POA: Diagnosis present

## 2024-08-18 DIAGNOSIS — F32A Depression, unspecified: Secondary | ICD-10-CM | POA: Diagnosis present

## 2024-08-18 DIAGNOSIS — Z79899 Other long term (current) drug therapy: Secondary | ICD-10-CM

## 2024-08-18 DIAGNOSIS — I25810 Atherosclerosis of coronary artery bypass graft(s) without angina pectoris: Secondary | ICD-10-CM | POA: Diagnosis present

## 2024-08-18 DIAGNOSIS — Z8249 Family history of ischemic heart disease and other diseases of the circulatory system: Secondary | ICD-10-CM

## 2024-08-18 DIAGNOSIS — I110 Hypertensive heart disease with heart failure: Secondary | ICD-10-CM | POA: Diagnosis present

## 2024-08-18 DIAGNOSIS — I2510 Atherosclerotic heart disease of native coronary artery without angina pectoris: Secondary | ICD-10-CM | POA: Diagnosis present

## 2024-08-18 DIAGNOSIS — G8929 Other chronic pain: Secondary | ICD-10-CM | POA: Diagnosis present

## 2024-08-18 DIAGNOSIS — Z85118 Personal history of other malignant neoplasm of bronchus and lung: Secondary | ICD-10-CM

## 2024-08-18 DIAGNOSIS — M25552 Pain in left hip: Principal | ICD-10-CM

## 2024-08-18 DIAGNOSIS — K5900 Constipation, unspecified: Secondary | ICD-10-CM | POA: Diagnosis not present

## 2024-08-18 DIAGNOSIS — Z6827 Body mass index (BMI) 27.0-27.9, adult: Secondary | ICD-10-CM

## 2024-08-18 DIAGNOSIS — Z7951 Long term (current) use of inhaled steroids: Secondary | ICD-10-CM

## 2024-08-18 DIAGNOSIS — E785 Hyperlipidemia, unspecified: Secondary | ICD-10-CM | POA: Diagnosis present

## 2024-08-18 DIAGNOSIS — Z7952 Long term (current) use of systemic steroids: Secondary | ICD-10-CM

## 2024-08-18 LAB — BASIC METABOLIC PANEL WITH GFR
Anion gap: 19 — ABNORMAL HIGH (ref 5–15)
BUN: 22 mg/dL (ref 8–23)
CO2: 23 mmol/L (ref 22–32)
Calcium: 8.5 mg/dL — ABNORMAL LOW (ref 8.9–10.3)
Chloride: 92 mmol/L — ABNORMAL LOW (ref 98–111)
Creatinine, Ser: 1.15 mg/dL (ref 0.61–1.24)
GFR, Estimated: >60 mL/min (ref >60)
Glucose, Bld: 176 mg/dL — ABNORMAL HIGH (ref 70–99)
Potassium: 3.8 mmol/L (ref 3.5–5.1)
Sodium: 134 mmol/L — ABNORMAL LOW (ref 135–145)

## 2024-08-18 LAB — CBC
HCT: 38.3 % — ABNORMAL LOW (ref 39.0–52.0)
Hemoglobin: 12.7 g/dL — ABNORMAL LOW (ref 13.0–17.0)
MCH: 31.6 pg (ref 26.0–34.0)
MCHC: 33.2 g/dL (ref 30.0–36.0)
MCV: 95.3 fL (ref 80.0–100.0)
Platelets: 319 K/uL (ref 150–400)
RBC: 4.02 MIL/uL — ABNORMAL LOW (ref 4.22–5.81)
RDW: 13.7 % (ref 11.5–15.5)
WBC: 11.1 K/uL — ABNORMAL HIGH (ref 4.0–10.5)
nRBC: 0 % (ref 0.0–0.2)

## 2024-08-18 LAB — BRAIN NATRIURETIC PEPTIDE: B Natriuretic Peptide: 44.3 pg/mL (ref 0.0–100.0)

## 2024-08-18 MED ORDER — ATORVASTATIN CALCIUM 40 MG PO TABS
40.0000 mg | ORAL_TABLET | Freq: Every day | ORAL | Status: DC
Start: 2024-08-19 — End: 2024-08-31
  Administered 2024-08-19 – 2024-08-30 (×13): 40 mg via ORAL
  Filled 2024-08-18 (×13): qty 1

## 2024-08-18 MED ORDER — FUROSEMIDE 10 MG/ML IJ SOLN
40.0000 mg | Freq: Once | INTRAMUSCULAR | Status: AC
  Administered 2024-08-18: 40 mg via INTRAVENOUS
  Filled 2024-08-18: qty 4

## 2024-08-18 MED ORDER — CITALOPRAM HYDROBROMIDE 20 MG PO TABS
40.0000 mg | ORAL_TABLET | Freq: Every day | ORAL | Status: DC
  Administered 2024-08-19 – 2024-08-31 (×13): 40 mg via ORAL
  Filled 2024-08-18: qty 4
  Filled 2024-08-18 (×12): qty 2

## 2024-08-18 MED ORDER — BUDESON-GLYCOPYRROL-FORMOTEROL 160-9-4.8 MCG/ACT IN AERO
2.0000 | INHALATION_SPRAY | Freq: Two times a day (BID) | RESPIRATORY_TRACT | Status: DC
Start: 2024-08-19 — End: 2024-08-31
  Administered 2024-08-20 – 2024-08-31 (×22): 2 via RESPIRATORY_TRACT
  Filled 2024-08-18 (×3): qty 5.9

## 2024-08-18 MED ORDER — FENTANYL CITRATE (PF) 50 MCG/ML IJ SOSY
50.0000 ug | PREFILLED_SYRINGE | Freq: Once | INTRAMUSCULAR | Status: AC
  Administered 2024-08-18: 50 ug via INTRAVENOUS
  Filled 2024-08-18: qty 1

## 2024-08-18 MED ORDER — HYDROCODONE-ACETAMINOPHEN 5-325 MG PO TABS
1.0000 | ORAL_TABLET | Freq: Four times a day (QID) | ORAL | Status: DC | PRN
  Administered 2024-08-19 (×2): 1 via ORAL
  Filled 2024-08-18 (×2): qty 1

## 2024-08-18 MED ORDER — PANTOPRAZOLE SODIUM 40 MG PO TBEC: 40.0000 mg | DELAYED_RELEASE_TABLET | Freq: Every day | ORAL | Status: DC | PRN

## 2024-08-18 MED ORDER — METOPROLOL SUCCINATE ER 25 MG PO TB24
12.5000 mg | ORAL_TABLET | Freq: Every day | ORAL | Status: DC
  Administered 2024-08-19 – 2024-08-31 (×13): 12.5 mg via ORAL
  Filled 2024-08-18 (×14): qty 1

## 2024-08-18 MED ORDER — IPRATROPIUM-ALBUTEROL 0.5-2.5 (3) MG/3ML IN SOLN
3.0000 mL | RESPIRATORY_TRACT | Status: DC | PRN
  Administered 2024-08-21 – 2024-08-30 (×3): 3 mL via RESPIRATORY_TRACT
  Filled 2024-08-18 (×3): qty 3

## 2024-08-18 MED ORDER — HYDROMORPHONE HCL 1 MG/ML IJ SOLN
1.0000 mg | Freq: Once | INTRAMUSCULAR | Status: AC
  Administered 2024-08-18: 1 mg via INTRAVENOUS
  Filled 2024-08-18: qty 1

## 2024-08-18 MED ORDER — HEPARIN SODIUM (PORCINE) 5000 UNIT/ML IJ SOLN
5000.0000 [IU] | Freq: Three times a day (TID) | INTRAMUSCULAR | Status: AC
  Administered 2024-08-18 – 2024-08-26 (×25): 5000 [IU] via SUBCUTANEOUS
  Filled 2024-08-18 (×23): qty 1

## 2024-08-18 MED ORDER — MORPHINE SULFATE (PF) 2 MG/ML IV SOLN
2.0000 mg | INTRAVENOUS | Status: DC | PRN
  Administered 2024-08-18 – 2024-08-19 (×3): 2 mg via INTRAVENOUS
  Filled 2024-08-18 (×3): qty 1

## 2024-08-18 MED ORDER — FUROSEMIDE 20 MG PO TABS
20.0000 mg | ORAL_TABLET | Freq: Every day | ORAL | Status: DC
  Administered 2024-08-19 – 2024-08-24 (×6): 20 mg via ORAL
  Filled 2024-08-18 (×6): qty 1

## 2024-08-18 MED ORDER — BUPROPION HCL ER (XL) 150 MG PO TB24
300.0000 mg | ORAL_TABLET | Freq: Every day | ORAL | Status: DC
  Administered 2024-08-19 – 2024-08-31 (×13): 300 mg via ORAL
  Filled 2024-08-18 (×13): qty 2

## 2024-08-18 MED ORDER — IRBESARTAN 150 MG PO TABS
75.0000 mg | ORAL_TABLET | Freq: Every day | ORAL | Status: DC
  Administered 2024-08-19 – 2024-08-31 (×13): 75 mg via ORAL
  Filled 2024-08-18 (×13): qty 1

## 2024-08-18 NOTE — ED Notes (Signed)
 CCMD contacted for cardiac monitoring

## 2024-08-18 NOTE — ED Triage Notes (Signed)
 Per EMS,Pt, from home, c/o L hip pain and BLE x over 1 week.  Pain score 10/10.  Pt was seen x3 days ago for same.  Pt reports he is unable to walk d/t pain and swelling.

## 2024-08-18 NOTE — ED Provider Notes (Signed)
  Physical Exam  BP (!) 153/83 (BP Location: Right Arm)   Pulse (!) 112   Temp 98.2 F (36.8 C) (Oral)   Resp 18   Ht 5' 8 (1.727 m)   Wt 82.1 kg   SpO2 91%   BMI 27.52 kg/m   Physical Exam  Procedures  Procedures  ED Course / MDM   Clinical Course as of 08/18/24 1805  Wed Aug 18, 2024  1445 Left voicemail on sports medicine consult phone [MT]  1510 Clarified [facility] office tells me they do not have surgical providers for hip replacement; advised that we contact other orthopedic offices [MT]    Clinical Course User Index [MT] Trifan, Donnice PARAS, MD   Medical Decision Making Care assumed at 3 PM.  Patient is here with left hip pain.  Patient already on pain medicine.  Patient did not follow-up with [facility] and referred to Ortho for hip replacement.  [name] from Ortho contacted and recommended pain control and outpatient follow-up.  Signout pending labs and x-rays  6:13 PM X-ray showed avascular necrosis and unclear if there is superimposed fracture.  CT scan showed avascular necrosis.  Cannot rule out impacted fracture.  I discussed with Dr. Joane from [facility] and he reviewed the images and recommend outpatient MRI and agreed with pain control.  Patient is still unable to even move the hip or get up.  Patient is also persistently tachycardic and legs are swollen.  Patient was given IV Lasix  and multiple rounds of pain meds and will be admitted for pain control.  Problems Addressed: Left hip pain: acute illness or injury Leg swelling: acute illness or injury Sinus tachycardia: acute illness or injury  Amount and/or Complexity of Data Reviewed Labs: ordered. Decision-making details documented in ED Course. Radiology: ordered and independent interpretation performed. Decision-making details documented in ED Course. ECG/medicine tests: ordered.  Risk Prescription drug management. Decision regarding hospitalization.           Gary Alm Macho, MD 08/18/24 [phone]

## 2024-08-18 NOTE — ED Provider Notes (Signed)
 [place] [facility] Provider Note   CSN: [id number] Arrival date & time: 08/18/24  1323     Patient presents with: Leg Swelling and Hip Pain   Gary Wright is a 78 y.o. male presented to ED complaining of left hip pain and swelling in his legs.  Patient was seen in the hospital 3 days ago which time he had an x-ray showing avascular necrosis with collapse of the left hip.  He says he has severe pain and difficulty walking.  His wife said that the patient was able to push himself up and pivot from the wheelchair for the past week, although in the past day has not been able to do so.  She feels his weakness likely related to his significant peripheral edema.  She said he was started on Lasix  in the ER 3 days ago but it has not been helping.  He does spend a lot of time sitting with his legs down.    Patient has been taking opioid prescribed from the ED.  This helps the pain a little bit but not a lot.  They have not seen an orthopedic surgeon.  They do follow with [facility] and have had multiple injections and attempted pain control of his hip pain.    HPI     Prior to Admission medications   Medication Sig Start Date End Date Taking? Authorizing Provider  albuterol  (PROVENTIL ) (2.5 MG/3ML) 0.083% nebulizer solution Take 3 mLs (2.5 mg total) by nebulization every 4 (four) hours as needed for wheezing or shortness of breath. 06/09/24   [name] Dorn NOVAK, MD  albuterol  (VENTOLIN  HFA) 108 (90 Base) MCG/ACT inhaler Inhale 1-2 puffs into the lungs every 4 (four) hours as needed for wheezing or shortness of breath. 06/09/24   [name] Dorn NOVAK, MD  atorvastatin  (LIPITOR) 40 MG tablet TAKE 1 TABLET(40 MG) BY MOUTH DAILY AT 6 PM Patient taking differently: Take 40 mg by mouth every evening. 08/18/23   Pietro Redell RAMAN, MD  budesonide  (PULMICORT ) 0.5 MG/2ML nebulizer solution Take 2 mLs (0.5 mg total) by nebulization 2 (two) times  daily. 01/13/24   [name] Dorn NOVAK, MD  buPROPion  (WELLBUTRIN  XL) 300 MG 24 hr tablet Take 300 mg by mouth daily. 05/11/16   [provider]  citalopram  (CELEXA ) 40 MG tablet Take 40 mg by mouth daily. 06/01/16   [provider]  clotrimazole -betamethasone  (LOTRISONE ) cream Apply 1 Application topically 2 (two) times daily. Apply to buttocks and left ankle & heel. Patient taking differently: Apply 1 Application topically 2 (two) times daily as needed (sin irritation). 02/05/24   Lucius Krabbe, NP  cyclobenzaprine  (FLEXERIL ) 5 MG tablet Take 1 tablet (5 mg total) by mouth 3 (three) times daily as needed for muscle spasms. 03/23/24   Lucius Krabbe, NP  diclofenac Sodium (VOLTAREN) 1 % GEL Apply 4 g topically 4 (four) times daily as needed. Rub on sore joints. 08/09/24   Lucius Krabbe, NP  Dupilumab  (DUPIXENT ) 300 MG/2ML SOAJ Inject 300 mg into the skin every 14 (fourteen) days. Patient taking differently: Inject 300 mg into the skin See admin instructions. Inject 2mL (300mg ) into the skin every 14 days - every other Monday. 07/21/24   [name] Dorn NOVAK, MD  Fluticasone -Umeclidin-Vilant (TRELEGY ELLIPTA ) 200-62.5-25 MCG/ACT AEPB Inhale 1 puff into the lungs every evening.    [provider]  furosemide  (LASIX ) 20 MG tablet Take 1 tablet (20 mg total) by mouth daily. 08/15/24   Zackowski, Scott,  MD  glipiZIDE  (GLUCOTROL  XL) 2.5 MG 24 hr tablet Take 1 tablet (2.5 mg total) by mouth as directed. Take 2 pills in the morning and 1 pill in the evening. 08/09/24   Lucius Krabbe, NP  HYDROcodone-acetaminophen  (NORCO/VICODIN) 5-325 MG tablet Take 1 tablet by mouth every 6 (six) hours as needed. 08/15/24   Scott, Rocky SAILOR, PA-C  ipratropium-albuterol  (DUONEB) 0.5-2.5 (3) MG/3ML SOLN Take 3 mLs by nebulization 4 (four) times daily. 12/22/23   Darlean Ozell NOVAK, MD  metoprolol  succinate (TOPROL -XL) 25 MG 24 hr tablet Take 0.5 tablets (12.5 mg total) by mouth See admin  instructions. Take 1/2 tablet (12.5mg ) by mouth once daily at noon. 08/06/24   Arlon Carliss ORN, DO  olmesartan  (BENICAR ) 40 MG tablet Take 1 tablet (40 mg total) by mouth daily. Patient taking differently: Take 40 mg by mouth See admin instructions. Take 1 tablet (40mg ) by mouth once daily at noon. 06/29/24 06/29/25  Darlean Ozell NOVAK, MD  pantoprazole  (PROTONIX ) 40 MG tablet One tablet by mouth daily 30 to 60 minutes before the first meal of the day Patient taking differently: Take 40 mg by mouth daily as needed (heartburn, acid reflux). 05/26/24   Darlean Ozell NOVAK, MD  predniSONE  (DELTASONE ) 10 MG tablet Take 1 tablet (10 mg total) by mouth daily with breakfast. 07/20/24   [name] Dorn NOVAK, MD  predniSONE  (DELTASONE ) 10 MG tablet Take 4 tablets (40 mg total) by mouth daily for 3 days, THEN 3 tablets (30 mg total) daily for 3 days, THEN 2 tablets (20 mg total) daily for 3 days, THEN 1 tablet (10 mg total) daily for 3 days. Once taper complete, resume previous daily prednisone  therapy. 08/06/24 08/18/24  Arlon Carliss ORN, DO  triamcinolone  ointment (KENALOG ) 0.5 % Apply topically 2 (two) times daily. Patient taking differently: Apply 1 Application topically 2 (two) times daily as needed (skin irritaiton). 06/24/24   Lucius Krabbe, NP    Allergies: Patient has no known allergies.    Review of Systems  Updated Vital Signs BP (!) 141/94   Pulse (!) 120   Temp 98.3 F (36.8 C) (Oral)   Resp 18   Ht 5' 8 (1.727 m)   Wt 82.1 kg   SpO2 99%   BMI 27.52 kg/m   Physical Exam Constitutional:      General: He is not in acute distress. HENT:     Head: Normocephalic and atraumatic.  Eyes:     Conjunctiva/sclera: Conjunctivae normal.     Pupils: Pupils are equal, round, and reactive to light.  Cardiovascular:     Rate and Rhythm: Regular rhythm. Tachycardia present.  Pulmonary:     Effort: Pulmonary effort is normal. No respiratory distress.  Abdominal:     General: There is no distension.      Tenderness: There is no abdominal tenderness.  Musculoskeletal:     Right lower leg: Edema present.     Left lower leg: Edema present.     Comments: Pain with range of motion of the left hip  Skin:    General: Skin is warm and dry.  Neurological:     General: No focal deficit present.     Mental Status: He is alert. Mental status is at baseline.  Psychiatric:        Mood and Affect: Mood normal.        Behavior: Behavior normal.     (all labs ordered are listed, but only abnormal results are displayed) Labs Reviewed  BASIC METABOLIC  PANEL WITH GFR - Abnormal; Notable for the following components:      Result Value   Sodium 134 (*)    Chloride 92 (*)    Glucose, Bld 176 (*)    Calcium  8.5 (*)    Anion gap 19 (*)    All other components within normal limits  CBC - Abnormal; Notable for the following components:   WBC 11.1 (*)    RBC 4.02 (*)    Hemoglobin 12.7 (*)    HCT 38.3 (*)    All other components within normal limits  BRAIN NATRIURETIC PEPTIDE    EKG: None  Radiology: CT Lumbar Spine Wo Contrast Result Date: 08/18/2024 CLINICAL DATA:  Low back pain EXAM: CT LUMBAR SPINE WITHOUT CONTRAST TECHNIQUE: Multidetector CT imaging of the lumbar spine was performed without intravenous contrast administration. Multiplanar CT image reconstructions were also generated. RADIATION DOSE REDUCTION: This exam was performed according to the departmental dose-optimization program which includes automated exposure control, adjustment of the mA and/or kV according to patient size and/or use of iterative reconstruction technique. COMPARISON:  Lumbar CT 08/15/2024, chest CT 08/02/2024 FINDINGS: Segmentation: Transitional anatomy, same numbering scheme as with comparison study from 08/15/2024. Transitional S1 vertebra. First non rib-bearing vertebra is designated L1. Alignment: Mild scoliosis. Grade 1 anterolisthesis L4 on L5. Trace retrolisthesis L2-L3 and L3-L4. Vertebrae: Vertebral body  heights are maintained.  No fracture Paraspinal and other soft tissues: Aortic atherosclerosis. No acute finding. Small left kidney stone. Disc levels: At L1-L2, no high-grade canal stenosis. Mild facet degenerative changes. At L2-L3, disc space narrowing and vacuum disc. No high-grade canal stenosis. Moderate facet degenerative changes. Moderate right greater than left subarticular and foraminal narrowing. At L3-L4, disc space narrowing. Diffuse disc bulge. Moderate hypertrophic facet degenerative changes. Moderate subarticular and foraminal narrowing bilaterally. At L4-L5, disc space narrowing and vacuum discs. Severe hypertrophic facet degenerative changes. Severe canal stenosis and moderate bilateral foraminal narrowing. At L5-S1, disc space narrowing and vacuum disc. Posterior decompression changes. Severe hypertrophic facet degenerative changes. Moderate bilateral foraminal narrowing. IMPRESSION: 1. Transitional anatomy, same numbering scheme as with comparison study from 08/15/2024. 2. No acute osseous abnormality. 3. Multilevel degenerative changes, most advanced at L4-L5 where there is severe canal stenosis and bilateral foraminal narrowing. 4. Aortic atherosclerosis. Aortic Atherosclerosis (ICD10-I70.0). Electronically Signed   By: Luke Bun M.D.   On: 08/18/2024 17:29   CT Hip Left Wo Contrast Result Date: 08/18/2024 CLINICAL DATA:  Left hip pain. EXAM: CT OF THE LEFT HIP WITHOUT CONTRAST TECHNIQUE: Multidetector CT imaging of the left hip was performed according to the standard protocol. Multiplanar CT image reconstructions were also generated. RADIATION DOSE REDUCTION: This exam was performed according to the departmental dose-optimization program which includes automated exposure control, adjustment of the mA and/or kV according to patient size and/or use of iterative reconstruction technique. COMPARISON:  Left hip radiograph dated 08/18/2024. FINDINGS: Bones/Joint/Cartilage There is flattening  of the left femoral head with cortical fragmentation. There is focal area of cortical angulation involving the basicervical femoral neck (coronal 73/7). An impacted acute fracture is not excluded. There is chronic appearing fragmentation of the posterior left acetabular cortex. An area of periarticular amorphous calcification at the lateral aspect of the hip joint (70/7) noted. There is thickening and irregularity of the joint capsule and synovium. Additional somewhat rounded calcifications along the anteromedial proximal femoral neck noted. There is slight lateral subluxation of the femoral head in relation to the acetabular cup. Faint linear irregularity involving the medial acetabular roof (  78/7) may be chronic. An acute fracture is not excluded. The bones are osteopenic. Ligaments Suboptimally assessed by CT. Muscles and Tendons No acute findings. Soft tissues Advanced atherosclerotic calcification of the aorta. IMPRESSION: 1. Chronic deformity and flattening of the left femoral head may be related to underlying avascular necrosis or chronic infection. An impacted basicervical fracture is not excluded. Orthopedic consult and further evaluation with MRI recommended. 2. 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 3.  Aortic Atherosclerosis (ICD10-I70.0). Electronically Signed   By: Vanetta Chou M.D.   On: 08/18/2024 17:24   DG Chest 1 View Result Date: 08/18/2024 CLINICAL DATA:  Fall. EXAM: CHEST  1 VIEW COMPARISON:  08/15/2024, CT 08/02/2024 FINDINGS: Chronic volume loss in the left hemithorax. Stable heart size and mediastinal contours. Aortic atherosclerosis underlying emphysema. No pneumothorax or significant pleural effusion. None limited assessment, no acute osseous findings. IMPRESSION: 1. No acute chest findings. 2. Chronic volume loss in the left hemithorax. Electronically Signed   By: Andrea Gasman M.D.   On: 08/18/2024 16:33   DG Hip Unilat W or Wo  Pelvis 2-3 Views Left Result Date: 08/18/2024 CLINICAL DATA:  Left hip pain.  Pain for over 1 week. EXAM: DG HIP (WITH OR WITHOUT PELVIS) 2-3V LEFT COMPARISON:  Radiograph 3 days ago.  Radiograph 01/29/2024 FINDINGS: Deformity of the left proximal femur and femoral head. Flattening and collapse of the femoral head with lateral subluxation of the femoral shaft and secondary osteoarthritis. Difficult to exclude superimposed fracture as there may be slight increased subluxation. Advanced right hip osteoarthritis with complete joint space loss, subchondral cystic changes and sclerosis. The pubic rami are intact. Prominent vascular calcifications. IMPRESSION: 1. Deformity of the left proximal femur and femoral head with flattening and collapse of the femoral head and secondary osteoarthritis. Difficult to exclude superimposed fracture as there may be slight increased subluxation. Significant progression from April, findings may represent progressive avascular necrosis or rapidly progressive osteoarthritis. 2. Advanced right hip osteoarthritis. Electronically Signed   By: Andrea Gasman M.D.   On: 08/18/2024 16:31     Procedures   Medications Ordered in the ED  fentaNYL  (SUBLIMAZE ) injection 50 mcg (50 mcg Intravenous Given 08/18/24 1455)  furosemide  (LASIX ) injection 40 mg (40 mg Intravenous Given 08/18/24 1541)  HYDROmorphone (DILAUDID) injection 1 mg (1 mg Intravenous Given 08/18/24 1705)    Clinical Course as of 08/18/24 1739  Wed Aug 18, 2024  1445 Left voicemail on sports medicine consult phone [MT]  1510 Clarified [facility] office tells me they do not have surgical providers for hip replacement; advised that we contact other orthopedic offices [MT]    Clinical Course User Index [MT] Gearlene Godsil, Donnice PARAS, MD                                 Medical Decision Making Amount and/or Complexity of Data Reviewed Labs: ordered. Radiology: ordered. ECG/medicine tests:  ordered.  Risk Prescription drug management.   This patient presents to the ED with concern for peripheral edema, left hip pain. This involves an extensive number of treatment options, and is a complaint that carries with it a high risk of complications and morbidity.  The differential diagnosis includes venous insufficiency or stasis vs CHF vs left hip fx vs osteonecrosis vs other  Additional history obtained from EMS, patient's wife  External records from outside source obtained and reviewed including [facility] evaluations for  chronic left hip pain  I ordered and personally interpreted labs.  The pertinent results include:  pending at signout  I ordered imaging studies including dg chest and left hip, which were pending at signout    The patient was maintained on a cardiac monitor.  I personally viewed and interpreted the cardiac monitored which showed an underlying rhythm of: sinus tachycardia (appears to be baseline for patient, likely pain related)  I ordered medication including IV pain medication  ave made adjustments as needed  Test Considered: doubt acute PE, DVT - sinus tachycardia is chronic per recent chart review, and he has no hypoxia  I discussed the case by phone with Ozell Herder from orthopedics (after clarifying with [facility] office that they do not perform hip replacement surgeries). He advises that this would be a non-emergent evaluation for potential hip surgery, which is complicated by patient's medical comorbidities, but he will confer with orthopedic surgeons tomorrow regarding possible follow up planning.   After speaking to the patient's wife by phone, it's clear that he has had a steady decline in function due to edema and pain at home, and cannot perform basic tasks anymore.  We discussed admission for diuresis and pain control - possible PT eval and SNF placement, while helping arrange outpatient ortho surgery follow up, if  possible.  After the interventions noted above, I reevaluated the patient and found that they have: stayed the same    Dispostion:  The patient is signed out to DR Alm Cave EDP at 3:20 pm pending follow up on labs, xray imaging     Final diagnoses:  Left hip pain  Avascular necrosis of bone of left hip [facility])    ED Discharge Orders     None          Cottie Donnice PARAS, MD 08/18/24 1739

## 2024-08-18 NOTE — Telephone Encounter (Signed)
 I got a secure chat message from an ER doc. Gary Wright is being admitted to the hospital for pain control regarding his hip. He has a bad hip AVN with a possible hip fx. Ortho has been consulted and recommended outpatient follow up. It looks like he may need a MRI which I have ordered but I am not sure what ortho will need.

## 2024-08-18 NOTE — H&P (Signed)
 History and Physical    Patient: Gary Wright FMW:[id number] DOB: 09-Jul-1946 DOA: 08/18/2024 DOS: the patient was seen and examined on 08/18/2024 PCP: Lucius Krabbe, NP  Patient coming from: Home  Chief Complaint: Left hip pain Chief Complaint  Patient presents with   Leg Swelling   Hip Pain   HPI: Gary Wright is a 78 y.o. 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  According to patient he has been having left hip pain for some time now and follows up with orthopedics as an outpatient.  According to patient he has not had any recent fall, last fall was about 3 months ago.  He used to walk with a walker however he is barely able to walk with a walker at this time as his left hip pain has become more severe within the last few days.  He rates it as 10/10 located in the left hip, partially relieved by pain medication administered in the emergency room. Patient denies nausea vomiting chest pain cough abdominal pain or urinary complaint.  ED course: Temperature 98.3, respiratory rate 18, pulse 115, blood pressure 143/82 saturating 100% on room air Chest x-ray did not show any evidence of infiltrate CT scan of the left hip showed chronic deformity and flattening of the left femoral head concerning for avascular necrosis or chronic infection.  Impacted basicervical fracture could not be excluded and therefore MRI recommended. ED physician discussed with orthopedics team who recommended patient could be admitted for pain management with plans to do MRI latervand to follow-up as an outpatient.  Review of Systems: As mentioned in the history of present illness. All other systems reviewed and are negative. Past Medical History:   Diagnosis Date   Acute dyspnea 03/24/2022   Acute exacerbation of chronic obstructive pulmonary disease (COPD) (HCC) 03/25/2022   Asthma    CAD (coronary artery disease) of artery bypass graft 06/13/2016   Chest pain    COPD (chronic obstructive pulmonary disease) (HCC)    COPD with acute exacerbation (HCC) 11/02/2020   Quit smokig 2019  - PFT's  121/22/23  FEV1 1.83 (60 % ) ratio 0.57  p 15 % improvement from saba p 0 prior to study with DLCO  16.4 (65%)   and FV curve mildly concave  - try off acei and on otc gerd rx 06/02/2023 >>>       Depression 09/01/2007   Qualifier: Diagnosis of  By: Nickola CMA, [place]     Elevated troponin 06/13/2016   History of lung cancer 11/02/2020   Hyperlipidemia 11/02/2020   Hypertension    Prolonged QT interval 11/02/2020   Stress-induced cardiomyopathy 06/13/2016   TOBACCO ABUSE 09/01/2007   Qualifier: Diagnosis of   By: Nickola CMA, [place]         Past Surgical History:  Procedure Laterality Date   BACK SURGERY     fusion in 1973   CARDIAC CATHETERIZATION N/A 06/13/2016   Procedure: Left Heart Cath and Coronary Angiography;  Surgeon: Lonni Hanson, MD;  Location: [facility] INVASIVE CV LAB;  Service: Cardiovascular;  Laterality: N/A;   INGUINAL HERNIA REPAIR Right 04/21/2018   Procedure: RIGHT INGUINAL HERNIA REPAIR ERAS PATHWAY;  Surgeon: Vanderbilt Ned, MD;  Location: [facility] OR;  Service: General;  Laterality: Right;  TAP BLOCK   INSERTION OF MESH  Right 04/21/2018   Procedure: INSERTION OF MESH;  Surgeon: Vanderbilt Ned, MD;  Location: [facility] OR;  Service: General;  Laterality: Right;   LUNG REMOVAL, PARTIAL Left    left upper lung removed about 6-7 years ago reported by pt   TONSILLECTOMY     Social History:  reports that he quit smoking about 6 years ago. His smoking use included cigarettes. He has never used smokeless tobacco. He reports that he does not drink alcohol and does not use drugs.  No Known Allergies  Family History  Problem Relation  Age of Onset   Hypertension Mother    Hypertension Father     Prior to Admission medications   Medication Sig Start Date End Date Taking? Authorizing Provider  albuterol  (PROVENTIL ) (2.5 MG/3ML) 0.083% nebulizer solution Take 3 mLs (2.5 mg total) by nebulization every 4 (four) hours as needed for wheezing or shortness of breath. 06/09/24  Yes [name] Dorn NOVAK, MD  albuterol  (VENTOLIN  HFA) 108 (90 Base) MCG/ACT inhaler Inhale 1-2 puffs into the lungs every 4 (four) hours as needed for wheezing or shortness of breath. 06/09/24  Yes [name] Dorn NOVAK, MD  atorvastatin  (LIPITOR) 40 MG tablet TAKE 1 TABLET(40 MG) BY MOUTH DAILY AT 6 PM 08/18/23  Yes Pietro Redell RAMAN, MD  budesonide  (PULMICORT ) 0.5 MG/2ML nebulizer solution Take 2 mLs (0.5 mg total) by nebulization 2 (two) times daily. 01/13/24  Yes [name] Dorn NOVAK, MD  buPROPion  (WELLBUTRIN  XL) 300 MG 24 hr tablet Take 300 mg by mouth daily. 05/11/16  Yes [provider]  citalopram  (CELEXA ) 40 MG tablet Take 40 mg by mouth daily. 06/01/16  Yes [provider]  cyclobenzaprine  (FLEXERIL ) 5 MG tablet Take 1 tablet (5 mg total) by mouth 3 (three) times daily as needed for muscle spasms. 03/23/24  Yes Lucius Krabbe, NP  diclofenac Sodium (VOLTAREN) 1 % GEL Apply 4 g topically 4 (four) times daily as needed. Rub on sore joints. 08/09/24  Yes Lucius Krabbe, NP  Dupilumab  (DUPIXENT ) 300 MG/2ML SOAJ Inject 300 mg into the skin every 14 (fourteen) days. 07/21/24  Yes [name] Dorn NOVAK, MD  Fluticasone -Umeclidin-Vilant (TRELEGY ELLIPTA ) 200-62.5-25 MCG/ACT AEPB Inhale 1 puff into the lungs every evening.   Yes [provider]  furosemide  (LASIX ) 20 MG tablet Take 1 tablet (20 mg total) by mouth daily. 08/15/24  Yes Zackowski, Scott, MD  glipiZIDE  (GLUCOTROL  XL) 2.5 MG 24 hr tablet Take 1 tablet (2.5 mg total) by mouth as directed. Take 2 pills in the morning and 1 pill in the evening. 08/09/24  Yes Hudnell, Krabbe, NP   HYDROcodone-acetaminophen  (NORCO/VICODIN) 5-325 MG tablet Take 1 tablet by mouth every 6 (six) hours as needed. 08/15/24  Yes Scott, Rocky SAILOR, PA-C  ipratropium-albuterol  (DUONEB) 0.5-2.5 (3) MG/3ML SOLN Take 3 mLs by nebulization 4 (four) times daily. 12/22/23  Yes Darlean Ozell NOVAK, MD  metoprolol  succinate (TOPROL -XL) 25 MG 24 hr tablet Take 0.5 tablets (12.5 mg total) by mouth See admin instructions. Take 1/2 tablet (12.5mg ) by mouth once daily at noon. 08/06/24  Yes Arlon Carliss ORN, DO  olmesartan  (BENICAR ) 40 MG tablet Take 1 tablet (40 mg total) by mouth daily. 06/29/24 06/29/25 Yes Darlean Ozell NOVAK, MD  pantoprazole  (PROTONIX ) 40 MG tablet One tablet by mouth daily 30 to 60 minutes before the first meal of the day Patient taking differently: Take 40 mg by mouth daily as needed (heartburn, acid reflux). 05/26/24  Yes Darlean Ozell NOVAK, MD  predniSONE  (DELTASONE ) 10 MG tablet Take  1 tablet (10 mg total) by mouth daily with breakfast. 07/20/24  Yes Dewald, Dorn NOVAK, MD  triamcinolone  ointment (KENALOG ) 0.5 % Apply topically 2 (two) times daily. Patient taking differently: Apply 1 Application topically 2 (two) times daily as needed (skin irritaiton). 06/24/24  Yes Lucius Krabbe, NP  predniSONE  (DELTASONE ) 10 MG tablet Take 4 tablets (40 mg total) by mouth daily for 3 days, THEN 3 tablets (30 mg total) daily for 3 days, THEN 2 tablets (20 mg total) daily for 3 days, THEN 1 tablet (10 mg total) daily for 3 days. Once taper complete, resume previous daily prednisone  therapy. Patient not taking: Reported on 08/18/2024 08/06/24 08/18/24  Arlon Carliss ORN, DO    Physical Exam:  General: Elderly male laying in bed in no acute respiratory distress HEENT: Pupils equal, round. Conjunctiva clear. Mucosa moist.  CV: RRR no M,R,G. Pulses +2 LE. No JVD.  Bilateral lower extremity pitting edema.  Musculoskeletal: Has bilateral lower extremity edema, left hip tenderness RESP: Clear to auscultation bilaterally  GI:  +BS, nontender no masses palpable Neuro: AAOx3, CN II - XII grossly intact. Sensation intact. Psych: Normal mood  Vitals:   08/18/24 1730 08/18/24 1745 08/18/24 1746 08/18/24 1802  BP: (!) 153/83 124/63 (!) 153/83   Pulse: (!) 112 (!) 114 (!) 112   Resp:   18   Temp:    98.2 F (36.8 C)  TempSrc:    Oral  SpO2: (!) 85% 91% 91%   Weight:      Height:        Data Reviewed: CT scan of the hip reviewed with findings as above    Latest Ref Rng & Units 08/18/2024    2:32 PM 08/15/2024    2:17 PM 08/06/2024    1:35 AM  CBC  WBC 4.0 - 10.5 K/uL 11.1  13.7  13.1   Hemoglobin 13.0 - 17.0 g/dL 87.2  86.4  88.4   Hematocrit 39.0 - 52.0 % 38.3  41.3  34.5   Platelets 150 - 400 K/uL 319  368  263        Latest Ref Rng & Units 08/18/2024    2:32 PM 08/15/2024    2:17 PM 08/06/2024    1:35 AM  BMP  Glucose 70 - 99 mg/dL 823  783  817   BUN 8 - 23 mg/dL 22  30  24    Creatinine 0.61 - 1.24 mg/dL 8.84  8.99  8.65   Sodium 135 - 145 mmol/L 134  134  136   Potassium 3.5 - 5.1 mmol/L 3.8  4.6  4.5   Chloride 98 - 111 mmol/L 92  101  100   CO2 22 - 32 mmol/L 23  21  26    Calcium  8.9 - 10.3 mg/dL 8.5  8.9  8.5      Assessment and Plan:  Acute on chronic left hip pain in a patient with underlying chronic avascular necrosis of the left hip Possible basicervical fracture of the left hip We will admit for pain control Orthopedics consulted-please refer to orthopedics note. Given concerns of possible impacted basicervical fracture in a patient with persistent pain despite analgesia this is worthwhile to obtain MRI of the hip as an inpatient We will follow-up on MRI of the hip which I have ordered and is pending at this time. Please reengage orthopedics based on MRI findings PT OT consulted Continue pain medication  COPD-not in acute exacerbation  We will keep on as needed nebulization  Diabetes mellitus type 2 with hyperglycemia Last A1c 6.21 May 2024 Patient takes glipizide  at  home We will hold oral hypoglycemic agents at this time Placed on sliding scale insulin  therapy Monitor glucose level closely  Mild hyponatremia Patient with sodium 134 Continue monitoring  Coronary artery disease Continue metoprolol  and statin therapy  GERD-continue PPI  Hyperlipidemia Continue atorvastatin   Essential hypertension History of stress-induced cardiomyopathy We will resume patient home medication including Metoprolol , Lasix , and ARB's Monitor blood pressure closely  DVT prophylaxis-continue Lovenox     Advance Care Planning:   Code Status: Prior full code  Consults: Orthopedics  Family Communication: No family at bedside  Severity of Illness: The appropriate patient status for this patient is OBSERVATION. Observation status is judged to be reasonable and necessary in order to provide the required intensity of service to ensure the patient's safety. The patient's presenting symptoms, physical exam findings, and initial radiographic and laboratory data in the context of their medical condition is felt to place them at decreased risk for further clinical deterioration. Furthermore, it is anticipated that the patient will be medically stable for discharge from the hospital within 2 midnights of admission.   Author: Drue ONEIDA Potter, MD 08/18/2024 7:30 PM  For on call review [email or web address].

## 2024-08-18 NOTE — Progress Notes (Signed)
 Patient ID: Gary Wright, male   DOB: 1945-12-28, 78 y.o.   MRN: [id number]  Received message from EDP regarding this patient with progressive AVN. He will need THA electively, will not be done acutely. He may f/u with Dr. Jerri or Vernetta once discharged or with already scheduled appt next week (so I've been told).    Gary DOROTHA Ned, PA-C Orthopedic Surgery [phone]

## 2024-08-18 NOTE — Telephone Encounter (Signed)
 Patient went to the ED today. Doctor there contacted our office and asked to speak to Dr Leonce. Mliss advised that Dr Leonce was out of town and reiterated Dr Keller response to the patients request. The ED doctor was checking to see if we were surgeons and confirmed that we were not. He is going to consult with ortho at the hospital and continue with treatment.

## 2024-08-19 ENCOUNTER — Ambulatory Visit (HOSPITAL_COMMUNITY)

## 2024-08-19 ENCOUNTER — Observation Stay (HOSPITAL_COMMUNITY)

## 2024-08-19 ENCOUNTER — Other Ambulatory Visit: Payer: Self-pay | Admitting: Cardiology

## 2024-08-19 ENCOUNTER — Inpatient Hospital Stay (HOSPITAL_COMMUNITY)

## 2024-08-19 DIAGNOSIS — Z8249 Family history of ischemic heart disease and other diseases of the circulatory system: Secondary | ICD-10-CM | POA: Diagnosis not present

## 2024-08-19 DIAGNOSIS — D649 Anemia, unspecified: Secondary | ICD-10-CM | POA: Diagnosis not present

## 2024-08-19 DIAGNOSIS — F32A Depression, unspecified: Secondary | ICD-10-CM | POA: Diagnosis present

## 2024-08-19 DIAGNOSIS — E7800 Pure hypercholesterolemia, unspecified: Secondary | ICD-10-CM

## 2024-08-19 DIAGNOSIS — M87052 Idiopathic aseptic necrosis of left femur: Secondary | ICD-10-CM | POA: Diagnosis not present

## 2024-08-19 DIAGNOSIS — I10 Essential (primary) hypertension: Secondary | ICD-10-CM

## 2024-08-19 DIAGNOSIS — I5032 Chronic diastolic (congestive) heart failure: Secondary | ICD-10-CM | POA: Diagnosis present

## 2024-08-19 DIAGNOSIS — M87852 Other osteonecrosis, left femur: Secondary | ICD-10-CM | POA: Diagnosis not present

## 2024-08-19 DIAGNOSIS — E871 Hypo-osmolality and hyponatremia: Secondary | ICD-10-CM | POA: Diagnosis present

## 2024-08-19 DIAGNOSIS — K219 Gastro-esophageal reflux disease without esophagitis: Secondary | ICD-10-CM | POA: Diagnosis present

## 2024-08-19 DIAGNOSIS — N179 Acute kidney failure, unspecified: Secondary | ICD-10-CM | POA: Diagnosis present

## 2024-08-19 DIAGNOSIS — I110 Hypertensive heart disease with heart failure: Secondary | ICD-10-CM | POA: Diagnosis present

## 2024-08-19 DIAGNOSIS — F419 Anxiety disorder, unspecified: Secondary | ICD-10-CM | POA: Diagnosis present

## 2024-08-19 DIAGNOSIS — Z87891 Personal history of nicotine dependence: Secondary | ICD-10-CM | POA: Diagnosis not present

## 2024-08-19 DIAGNOSIS — Z79899 Other long term (current) drug therapy: Secondary | ICD-10-CM | POA: Diagnosis not present

## 2024-08-19 DIAGNOSIS — Z7951 Long term (current) use of inhaled steroids: Secondary | ICD-10-CM | POA: Diagnosis not present

## 2024-08-19 DIAGNOSIS — M879 Osteonecrosis, unspecified: Secondary | ICD-10-CM | POA: Diagnosis present

## 2024-08-19 DIAGNOSIS — M25552 Pain in left hip: Secondary | ICD-10-CM | POA: Diagnosis not present

## 2024-08-19 DIAGNOSIS — Z902 Acquired absence of lung [part of]: Secondary | ICD-10-CM | POA: Diagnosis not present

## 2024-08-19 DIAGNOSIS — M7989 Other specified soft tissue disorders: Secondary | ICD-10-CM | POA: Diagnosis not present

## 2024-08-19 DIAGNOSIS — E1165 Type 2 diabetes mellitus with hyperglycemia: Secondary | ICD-10-CM | POA: Diagnosis present

## 2024-08-19 DIAGNOSIS — Z751 Person awaiting admission to adequate facility elsewhere: Secondary | ICD-10-CM | POA: Diagnosis not present

## 2024-08-19 DIAGNOSIS — K5900 Constipation, unspecified: Secondary | ICD-10-CM | POA: Diagnosis not present

## 2024-08-19 DIAGNOSIS — R000 Tachycardia, unspecified: Secondary | ICD-10-CM | POA: Diagnosis present

## 2024-08-19 DIAGNOSIS — Z7984 Long term (current) use of oral hypoglycemic drugs: Secondary | ICD-10-CM | POA: Diagnosis not present

## 2024-08-19 DIAGNOSIS — J4489 Other specified chronic obstructive pulmonary disease: Secondary | ICD-10-CM | POA: Diagnosis present

## 2024-08-19 DIAGNOSIS — G8929 Other chronic pain: Secondary | ICD-10-CM | POA: Diagnosis present

## 2024-08-19 DIAGNOSIS — I2510 Atherosclerotic heart disease of native coronary artery without angina pectoris: Secondary | ICD-10-CM | POA: Diagnosis present

## 2024-08-19 DIAGNOSIS — E785 Hyperlipidemia, unspecified: Secondary | ICD-10-CM | POA: Diagnosis present

## 2024-08-19 DIAGNOSIS — E8809 Other disorders of plasma-protein metabolism, not elsewhere classified: Secondary | ICD-10-CM | POA: Diagnosis not present

## 2024-08-19 DIAGNOSIS — I25810 Atherosclerosis of coronary artery bypass graft(s) without angina pectoris: Secondary | ICD-10-CM | POA: Diagnosis present

## 2024-08-19 LAB — GLUCOSE, CAPILLARY
Glucose-Capillary: 139 mg/dL — ABNORMAL HIGH (ref 70–99)
Glucose-Capillary: 106 mg/dL — ABNORMAL HIGH (ref 70–99)
Glucose-Capillary: 168 mg/dL — ABNORMAL HIGH (ref 70–99)

## 2024-08-19 LAB — SYNOVIAL CELL COUNT + DIFF, W/ CRYSTALS
Crystals, Fluid: NONE SEEN
Eosinophils-Synovial: UNDETERMINED % (ref 0–1)
Lymphocytes-Synovial Fld: UNDETERMINED % (ref 0–20)
Monocyte-Macrophage-Synovial Fluid: UNDETERMINED % (ref 50–90)
Neutrophil, Synovial: UNDETERMINED % (ref 0–25)
Other Cells-SYN: UNDETERMINED
WBC, Synovial: UNDETERMINED /mm3 (ref 0–200)

## 2024-08-19 LAB — CBC
HCT: 41 % (ref 39.0–52.0)
Hemoglobin: 13.6 g/dL (ref 13.0–17.0)
MCH: 32.3 pg (ref 26.0–34.0)
MCHC: 33.2 g/dL (ref 30.0–36.0)
MCV: 97.4 fL (ref 80.0–100.0)
Platelets: 324 K/uL (ref 150–400)
RBC: 4.21 MIL/uL — ABNORMAL LOW (ref 4.22–5.81)
RDW: 13.7 % (ref 11.5–15.5)
WBC: 11.4 K/uL — ABNORMAL HIGH (ref 4.0–10.5)
nRBC: 0 % (ref 0.0–0.2)

## 2024-08-19 LAB — BASIC METABOLIC PANEL WITH GFR
Anion gap: 14 (ref 5–15)
BUN: 19 mg/dL (ref 8–23)
CO2: 26 mmol/L (ref 22–32)
Calcium: 8.7 mg/dL — ABNORMAL LOW (ref 8.9–10.3)
Chloride: 97 mmol/L — ABNORMAL LOW (ref 98–111)
Creatinine, Ser: 1.05 mg/dL (ref 0.61–1.24)
GFR, Estimated: >60 mL/min (ref >60)
Glucose, Bld: 114 mg/dL — ABNORMAL HIGH (ref 70–99)
Potassium: 3.6 mmol/L (ref 3.5–5.1)
Sodium: 137 mmol/L (ref 135–145)

## 2024-08-19 LAB — CBG MONITORING, ED
Glucose-Capillary: 119 mg/dL — ABNORMAL HIGH (ref 70–99)
Glucose-Capillary: 169 mg/dL — ABNORMAL HIGH (ref 70–99)

## 2024-08-19 LAB — C-REACTIVE PROTEIN: CRP: 2 mg/dL — ABNORMAL HIGH (ref <1.0)

## 2024-08-19 LAB — SEDIMENTATION RATE: Sed Rate: 15 mm/h (ref 0–16)

## 2024-08-19 MED ORDER — HYDROCODONE-ACETAMINOPHEN 5-325 MG PO TABS
1.0000 | ORAL_TABLET | ORAL | Status: DC | PRN
Start: 2024-08-19 — End: 2024-08-24
  Administered 2024-08-19 – 2024-08-24 (×18): 2 via ORAL
  Filled 2024-08-19 (×18): qty 2

## 2024-08-19 MED ORDER — INSULIN ASPART 100 UNIT/ML IJ SOLN
0.0000 [IU] | Freq: Three times a day (TID) | INTRAMUSCULAR | Status: DC
  Administered 2024-08-19 – 2024-08-21 (×5): 1 [IU] via SUBCUTANEOUS
  Administered 2024-08-21 – 2024-08-22 (×2): 2 [IU] via SUBCUTANEOUS
  Administered 2024-08-22 (×2): 1 [IU] via SUBCUTANEOUS
  Administered 2024-08-23: 2 [IU] via SUBCUTANEOUS
  Administered 2024-08-23 – 2024-08-27 (×5): 1 [IU] via SUBCUTANEOUS
  Administered 2024-08-28 (×2): 3 [IU] via SUBCUTANEOUS
  Administered 2024-08-28: 2 [IU] via SUBCUTANEOUS
  Administered 2024-08-29: 1 [IU] via SUBCUTANEOUS
  Administered 2024-08-29 – 2024-08-30 (×2): 2 [IU] via SUBCUTANEOUS
  Administered 2024-08-30 – 2024-08-31 (×3): 1 [IU] via SUBCUTANEOUS
  Filled 2024-08-19: qty 1
  Filled 2024-08-19: qty 3
  Filled 2024-08-19 (×2): qty 1
  Filled 2024-08-19: qty 3
  Filled 2024-08-19: qty 2
  Filled 2024-08-19: qty 3
  Filled 2024-08-19 (×3): qty 1
  Filled 2024-08-19: qty 2
  Filled 2024-08-19 (×2): qty 1
  Filled 2024-08-19 (×2): qty 2
  Filled 2024-08-19 (×2): qty 1

## 2024-08-19 MED ORDER — HYDROMORPHONE HCL 1 MG/ML IJ SOLN
1.0000 mg | INTRAMUSCULAR | Status: DC | PRN
  Administered 2024-08-19 – 2024-08-27 (×12): 1 mg via INTRAVENOUS
  Filled 2024-08-19 (×12): qty 1

## 2024-08-19 MED ORDER — CYCLOBENZAPRINE HCL 5 MG PO TABS
5.0000 mg | ORAL_TABLET | Freq: Three times a day (TID) | ORAL | Status: DC | PRN
  Administered 2024-08-19 – 2024-08-25 (×5): 5 mg via ORAL
  Filled 2024-08-19 (×5): qty 1

## 2024-08-19 MED ORDER — LIDOCAINE HCL (PF) 1 % IJ SOLN
5.0000 mL | Freq: Once | INTRAMUSCULAR | Status: AC
  Administered 2024-08-19: 5 mL via INTRADERMAL

## 2024-08-19 MED ORDER — GADOBUTROL 1 MMOL/ML IV SOLN
8.0000 mL | Freq: Once | INTRAVENOUS | Status: AC | PRN
  Administered 2024-08-19: 8 mL via INTRAVENOUS

## 2024-08-19 NOTE — H&P (Incomplete)
 History and Physical    Patient: Gary Wright FMW:[id number] DOB: July 27, 1946 DOA: 08/18/2024 DOS: the patient was seen and examined on 08/18/2024 PCP: Lucius Krabbe, NP  Patient coming from: Home  Chief Complaint: Left hip pain Chief Complaint  Patient presents with  . Leg Swelling  . Hip Pain   HPI: Gary Wright is a 78 y.o. 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  According to patient he has been having left hip pain for some time now and follows up with orthopedics as an outpatient.  According to patient he has not had any recent fall, last fall was about 3 months ago.  He used to walk with a walker however he is barely able to walk with a walker at this time as his left hip pain has become more severe within the last few days.  He rates it as 10/10 located in the left hip, partially relieved by pain medication administered in the emergency room. Patient denies nausea vomiting chest pain cough abdominal pain or urinary complaint.  ED course: Temperature 98.3, respiratory rate 18, pulse 115, blood pressure 143/82 saturating 100% on room air Chest x-ray did not show any evidence of infiltrate CT scan of the left hip showed chronic deformity and flattening of the left femoral head concerning for avascular necrosis or chronic infection.  Impacted basicervical fracture could not be excluded and therefore MRI recommended. ED physician discussed with orthopedics team who recommended patient could be admitted for pain management with plans to do MRI and to follow-up as an outpatient.  Review of Systems: As mentioned in the history of present illness. All other systems reviewed and are negative. Past Medical History:   Diagnosis Date  . Acute dyspnea 03/24/2022  . Acute exacerbation of chronic obstructive pulmonary disease (COPD) (HCC) 03/25/2022  . Asthma   . CAD (coronary artery disease) of artery bypass graft 06/13/2016  . Chest pain   . COPD (chronic obstructive pulmonary disease) (HCC)   . COPD with acute exacerbation (HCC) 11/02/2020   Quit smokig 2019  - PFT's  121/22/23  FEV1 1.83 (60 % ) ratio 0.57  p 15 % improvement from saba p 0 prior to study with DLCO  16.4 (65%)   and FV curve mildly concave  - try off acei and on otc gerd rx 06/02/2023 >>>      . Depression 09/01/2007   Qualifier: Diagnosis of  By: Nickola CMA, [place]    . Elevated troponin 06/13/2016  . History of lung cancer 11/02/2020  . Hyperlipidemia 11/02/2020  . Hypertension   . Prolonged QT interval 11/02/2020  . Stress-induced cardiomyopathy 06/13/2016  . TOBACCO ABUSE 09/01/2007   Qualifier: Diagnosis of   By: Nickola CMA, [place]         Past Surgical History:  Procedure Laterality Date  . BACK SURGERY     fusion in 1973  . CARDIAC CATHETERIZATION N/A 06/13/2016   Procedure: Left Heart Cath and Coronary Angiography;  Surgeon: Lonni Hanson, MD;  Location: [facility] INVASIVE CV LAB;  Service: Cardiovascular;  Laterality: N/A;  . INGUINAL HERNIA REPAIR Right 04/21/2018   Procedure: RIGHT INGUINAL HERNIA REPAIR ERAS PATHWAY;  Surgeon: Vanderbilt Ned, MD;  Location: [facility] OR;  Service: General;  Laterality: Right;  TAP BLOCK  . INSERTION OF MESH  Right 04/21/2018   Procedure: INSERTION OF MESH;  Surgeon: Vanderbilt Ned, MD;  Location: [facility] OR;  Service: General;  Laterality: Right;  . LUNG REMOVAL, PARTIAL Left    left upper lung removed about 6-7 years ago reported by pt  . TONSILLECTOMY     Social History:  reports that he quit smoking about 6 years ago. His smoking use included cigarettes. He has never used smokeless tobacco. He reports that he does not drink alcohol and does not use drugs.  No Known Allergies  Family History   Problem Relation Age of Onset  . Hypertension Mother   . Hypertension Father     Prior to Admission medications   Medication Sig Start Date End Date Taking? Authorizing Provider  albuterol  (PROVENTIL ) (2.5 MG/3ML) 0.083% nebulizer solution Take 3 mLs (2.5 mg total) by nebulization every 4 (four) hours as needed for wheezing or shortness of breath. 06/09/24  Yes [name] Dorn NOVAK, MD  albuterol  (VENTOLIN  HFA) 108 (90 Base) MCG/ACT inhaler Inhale 1-2 puffs into the lungs every 4 (four) hours as needed for wheezing or shortness of breath. 06/09/24  Yes [name] Dorn NOVAK, MD  atorvastatin  (LIPITOR) 40 MG tablet TAKE 1 TABLET(40 MG) BY MOUTH DAILY AT 6 PM 08/18/23  Yes Pietro Redell RAMAN, MD  budesonide  (PULMICORT ) 0.5 MG/2ML nebulizer solution Take 2 mLs (0.5 mg total) by nebulization 2 (two) times daily. 01/13/24  Yes [name] Dorn NOVAK, MD  buPROPion  (WELLBUTRIN  XL) 300 MG 24 hr tablet Take 300 mg by mouth daily. 05/11/16  Yes [provider]  citalopram  (CELEXA ) 40 MG tablet Take 40 mg by mouth daily. 06/01/16  Yes [provider]  cyclobenzaprine  (FLEXERIL ) 5 MG tablet Take 1 tablet (5 mg total) by mouth 3 (three) times daily as needed for muscle spasms. 03/23/24  Yes Lucius Krabbe, NP  diclofenac Sodium (VOLTAREN) 1 % GEL Apply 4 g topically 4 (four) times daily as needed. Rub on sore joints. 08/09/24  Yes Hudnell, Krabbe, NP  Dupilumab  (DUPIXENT ) 300 MG/2ML SOAJ Inject 300 mg into the skin every 14 (fourteen) days. 07/21/24  Yes [name] Dorn NOVAK, MD  Fluticasone -Umeclidin-Vilant (TRELEGY ELLIPTA ) 200-62.5-25 MCG/ACT AEPB Inhale 1 puff into the lungs every evening.   Yes [provider]  furosemide  (LASIX ) 20 MG tablet Take 1 tablet (20 mg total) by mouth daily. 08/15/24  Yes Zackowski, Scott, MD  glipiZIDE  (GLUCOTROL  XL) 2.5 MG 24 hr tablet Take 1 tablet (2.5 mg total) by mouth as directed. Take 2 pills in the morning and 1 pill in the evening. 08/09/24  Yes  Hudnell, Krabbe, NP  HYDROcodone-acetaminophen  (NORCO/VICODIN) 5-325 MG tablet Take 1 tablet by mouth every 6 (six) hours as needed. 08/15/24  Yes Scott, Rocky SAILOR, PA-C  ipratropium-albuterol  (DUONEB) 0.5-2.5 (3) MG/3ML SOLN Take 3 mLs by nebulization 4 (four) times daily. 12/22/23  Yes Darlean Ozell NOVAK, MD  metoprolol  succinate (TOPROL -XL) 25 MG 24 hr tablet Take 0.5 tablets (12.5 mg total) by mouth See admin instructions. Take 1/2 tablet (12.5mg ) by mouth once daily at noon. 08/06/24  Yes Arlon Carliss ORN, DO  olmesartan  (BENICAR ) 40 MG tablet Take 1 tablet (40 mg total) by mouth daily. 06/29/24 06/29/25 Yes Darlean Ozell NOVAK, MD  pantoprazole  (PROTONIX ) 40 MG tablet One tablet by mouth daily 30 to 60 minutes before the first meal of the day Patient taking differently: Take 40 mg by mouth daily as needed (heartburn, acid reflux). 05/26/24  Yes Darlean Ozell NOVAK, MD  predniSONE  (DELTASONE ) 10 MG tablet Take  1 tablet (10 mg total) by mouth daily with breakfast. 07/20/24  Yes [name] Dorn NOVAK, MD  triamcinolone  ointment (KENALOG ) 0.5 % Apply topically 2 (two) times daily. Patient taking differently: Apply 1 Application topically 2 (two) times daily as needed (skin irritaiton). 06/24/24  Yes Hudnell, Corean, NP  predniSONE  (DELTASONE ) 10 MG tablet Take 4 tablets (40 mg total) by mouth daily for 3 days, THEN 3 tablets (30 mg total) daily for 3 days, THEN 2 tablets (20 mg total) daily for 3 days, THEN 1 tablet (10 mg total) daily for 3 days. Once taper complete, resume previous daily prednisone  therapy. Patient not taking: Reported on 08/18/2024 08/06/24 08/18/24  Arlon Carliss ORN, DO    Physical Exam:  General: Elderly male laying in bed in no acute respiratory distress HEENT: Pupils equal, round. Conjunctiva clear. Mucosa moist.  CV: RRR no M,R,G. Pulses +2 LE. No JVD.  Bilateral lower extremity pitting edema.  Musculoskeletal: Has bilateral lower extremity edema, left hip tenderness RESP: Clear to  auscultation bilaterally  GI: +BS, nontender no masses palpable Neuro: AAOx3, CN II - XII grossly intact. Sensation intact. Psych: Normal mood  Vitals:   08/18/24 1730 08/18/24 1745 08/18/24 1746 08/18/24 1802  BP: (!) 153/83 124/63 (!) 153/83   Pulse: (!) 112 (!) 114 (!) 112   Resp:   18   Temp:    98.2 F (36.8 C)  TempSrc:    Oral  SpO2: (!) 85% 91% 91%   Weight:      Height:        Data Reviewed: CT scan of the hip reviewed with findings as above    Latest Ref Rng & Units 08/18/2024    2:32 PM 08/15/2024    2:17 PM 08/06/2024    1:35 AM  CBC  WBC 4.0 - 10.5 K/uL 11.1  13.7  13.1   Hemoglobin 13.0 - 17.0 g/dL 87.2  86.4  88.4   Hematocrit 39.0 - 52.0 % 38.3  41.3  34.5   Platelets 150 - 400 K/uL 319  368  263        Latest Ref Rng & Units 08/18/2024    2:32 PM 08/15/2024    2:17 PM 08/06/2024    1:35 AM  BMP  Glucose 70 - 99 mg/dL 823  783  817   BUN 8 - 23 mg/dL 22  30  24    Creatinine 0.61 - 1.24 mg/dL 8.84  8.99  8.65   Sodium 135 - 145 mmol/L 134  134  136   Potassium 3.5 - 5.1 mmol/L 3.8  4.6  4.5   Chloride 98 - 111 mmol/L 92  101  100   CO2 22 - 32 mmol/L 23  21  26    Calcium  8.9 - 10.3 mg/dL 8.5  8.9  8.5      Assessment and Plan:  Acute on chronic left hip pain in a patient with underlying chronic avascular necrosis of the left hip Possible basicervical fracture We will admit for pain control Orthopedics consulted-please refer to orthopedics note. Given concerns of possible impacted basicervical fracture in a patient with persistent pain despite analgesia this is worthwhile to obtain MRI of the hip as an inpatient We will follow-up on MRI of the hip Please reengage orthopedics based on MRI findings PT OT consulted  COPD-not in acute exacerbation  We will keep on as needed nebulization  Diabetes mellitus type 2 with hyperglycemia Last A1c 6.21 May 2024 Patient takes glipizide  at home We  will hold oral hypoglycemic agents at this  time Placed on sliding scale insulin  therapy Monitor glucose level closely  Mild hyponatremia Patient with sodium 134 Continue monitoring  Coronary artery disease Continue metoprolol  and statin therapy  GERD-continue PPI  Hyperlipidemia Continue atorvastatin   Essential hypertension History of stress-induced cardiomyopathy We will resume patient home medication including Metoprolol , Lasix , irbesartan Monitor blood pressure closely  DVT prophylaxis-continue Lovenox     Advance Care Planning:   Code Status: Prior full code  Consults: Orthopedics  Family Communication: No family at bedside  Severity of Illness: The appropriate patient status for this patient is OBSERVATION. Observation status is judged to be reasonable and necessary in order to provide the required intensity of service to ensure the patient's safety. The patient's presenting symptoms, physical exam findings, and initial radiographic and laboratory data in the context of their medical condition is felt to place them at decreased risk for further clinical deterioration. Furthermore, it is anticipated that the patient will be medically stable for discharge from the hospital within 2 midnights of admission.   Author: Drue ONEIDA Potter, MD 08/18/2024 7:30 PM  For on call review [email or web address].

## 2024-08-19 NOTE — Evaluation (Signed)
 Occupational Therapy Evaluation Patient Details Name: Gary Wright MRN: [id number] DOB: [birth year] Today's Date: 08/19/2024   History of Present Illness   78 y.o. male presents to [facility] 08/18/24 with severe L hip pain. 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 Also with L hip joint effusion w/ plan for joint aspiration. Recent prior admits due to SOB with hypoxia. PMH positive for stage IV COPD, CAD s/p CABG, HTN, HLD, DM.     Clinical Impressions Pt was using a rollator vs SPC for ambulation prior to inability to walk leading up to this admission. He endorses a fall in which he was unable to get up from the floor. He was modified independent in self care. Pt resides with his wife who cannot provide heavy physical assistance. Pt presents with 8/10 L hip pain, premedicated. He was only able to sit EOB this visit with UE support and L LE extended and supported. He needs set up to total assist for ADLs. Patient will benefit from continued inpatient follow up therapy, <3 hours/day. Will follow acutely.      If plan is discharge home, recommend the following:   Two people to help with walking and/or transfers;A lot of help with bathing/dressing/bathroom;Assistance with cooking/housework;Assist for transportation;Help with stairs or ramp for entrance     Functional Status Assessment   Patient has had a recent decline in their functional status and demonstrates the ability to make significant improvements in function in a reasonable and predictable amount of time.     Equipment Recommendations   Wheelchair (measurements OT);Wheelchair cushion (measurements OT);Other (comment) (drop arm commode)     Recommendations for Other Services         Precautions/Restrictions   Precautions Precautions: Fall Recall of Precautions/Restrictions: Intact Restrictions Weight Bearing Restrictions Per Provider Order:  No     Mobility Bed Mobility Overal bed mobility: Needs Assistance Bed Mobility: Supine to Sit, Sit to Supine     Supine to sit: Min assist, +2 for physical assistance, +2 for safety/equipment Sit to supine: Min assist, +2 for physical assistance, +2 for safety/equipment   General bed mobility comments: Assist to guide LLE on/off EOB and to support LLE once in sitting.    Transfers                   General transfer comment: pt declined due to pain      Balance Overall balance assessment: Needs assistance, History of Falls Sitting-balance support: Feet supported, Bilateral upper extremity supported Sitting balance-Leahy Scale: Poor Sitting balance - Comments: reliant on B UE support and support to prop L LE in extension                                   ADL either performed or assessed with clinical judgement   ADL Overall ADL's : Needs assistance/impaired Eating/Feeding: Independent;Bed level   Grooming: Supervision/safety;Sitting   Upper Body Bathing: Minimal assistance;Sitting   Lower Body Bathing: Total assistance;Bed level   Upper Body Dressing : Minimal assistance;Sitting   Lower Body Dressing: Total assistance;Bed level       Toileting- Clothing Manipulation and Hygiene: Total assistance;Bed level               Vision Ability to See in Adequate Light: 0 Adequate Patient Visual Report: No change from baseline       Perception  Praxis         Pertinent Vitals/Pain Pain Assessment Pain Assessment: 0-10 Pain Score: 8  Pain Location: L hip Pain Descriptors / Indicators: Aching, Grimacing, Discomfort Pain Intervention(s): Repositioned, Monitored during session, Premedicated before session, Limited activity within patient's tolerance     Extremity/Trunk Assessment Upper Extremity Assessment Upper Extremity Assessment: Overall WFL for tasks assessed   Lower Extremity Assessment Lower Extremity Assessment: Defer  to PT evaluation LLE Deficits / Details: able to contract quad and ankle DF/PF. Declined knee or hip flexion due to fear of pain. Edema in B feet LLE Sensation: WNL   Cervical / Trunk Assessment Cervical / Trunk Assessment: Kyphotic;Other exceptions (chronic back pain)   Communication Communication Communication: No apparent difficulties   Cognition Arousal: Alert Behavior During Therapy: WFL for tasks assessed/performed Cognition: No apparent impairments                               Following commands: Intact       Cueing  General Comments   Cueing Techniques: Verbal cues      Exercises     Shoulder Instructions      Home Living Family/patient expects to be discharged to:: Private residence Living Arrangements: Spouse/significant other Available Help at Discharge: Family;Available 24 hours/day Type of Home: House Home Access: Stairs to enter [employer] of Steps: 4 Entrance Stairs-Rails: Right Home Layout: One level     Bathroom Shower/Tub: Chief Strategy Officer: Standard Bathroom Accessibility: Yes   Home Equipment: Rollator (4 wheels);Cane - single point;Shower seat          Prior Functioning/Environment Prior Level of Function : Independent/Modified Independent             Mobility Comments: Uses rollator vs SPC. Unable to walk in the past few days due to L hip pain. One recent fall with increased difficulty getting up from the ground ADLs Comments: Ind    OT Problem List: Decreased strength;Impaired balance (sitting and/or standing);Decreased knowledge of use of DME or AE;Pain   OT Treatment/Interventions: Self-care/ADL training;DME and/or AE instruction;Therapeutic activities;Patient/family education;Balance training      OT Goals(Current goals can be found in the care plan section)   Acute Rehab OT Goals OT Goal Formulation: With patient Time For Goal Achievement: 09/02/24 Potential to Achieve Goals:  Good ADL Goals Pt Will Perform Grooming: with set-up;sitting Pt Will Perform Lower Body Bathing: with min assist;sitting/lateral leans;with adaptive equipment Pt Will Perform Lower Body Dressing: with min assist;sitting/lateral leans;with adaptive equipment Pt Will Transfer to Toilet: with min assist;stand pivot transfer;with transfer board;bedside commode Pt Will Perform Toileting - Clothing Manipulation and hygiene: with min assist;sitting/lateral leans Additional ADL Goal #1: Pt will complete bed mobility with min assist in preparation for ADLs.   OT Frequency:  Min 2X/week    Co-evaluation PT/OT/SLP Co-Evaluation/Treatment: Yes Reason for Co-Treatment: For patient/therapist safety;To address functional/ADL transfers;Complexity of the patient's impairments (multi-system involvement) PT goals addressed during session: Mobility/safety with mobility OT goals addressed during session: ADL's and self-care      AM-PAC OT 6 Clicks Daily Activity     Outcome Measure Help from another person eating meals?: None Help from another person taking care of personal grooming?: A Little Help from another person toileting, which includes using toliet, bedpan, or urinal?: Total Help from another person bathing (including washing, rinsing, drying)?: A Lot Help from another person to put on and taking off regular upper body  clothing?: A Little Help from another person to put on and taking off regular lower body clothing?: Total 6 Click Score: 14   End of Session    Activity Tolerance: Patient limited by pain Patient left: in bed;with call bell/phone within reach  OT Visit Diagnosis: Pain;History of falling (Z91.81) Pain - Right/Left: Left Pain - part of body: Hip                Time: 9078-9055 OT Time Calculation (min): 23 min Charges:  OT General Charges $OT Visit: 1 Visit OT Evaluation $OT Eval Moderate Complexity: 1 Mod Gary Wright, OTR/L Acute Rehabilitation Services Office:  [phone]   Gary Wright 08/19/2024, 11:23 AM

## 2024-08-19 NOTE — Plan of Care (Signed)

## 2024-08-19 NOTE — Evaluation (Signed)
 Physical Therapy Evaluation Patient Details Name: Gary Wright MRN: [id number] DOB: Mar 26, 1946 Today's Date: 08/19/2024  History of Present Illness  78 y.o. male presents to [facility] 08/18/24 with severe L hip pain. 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 Also with L hip joint effusion w/ plan for joint aspiration. Recent prior admits due to SOB with hypoxia. PMH positive for stage IV COPD, CAD s/p CABG, HTN, HLD, DM.   Clinical Impression  PTA pt was ambulating with either a rollator or SP cane with increased difficulty over the past few days due to an increase in L hip pain. Pt had a recent fall with increased difficulty standing up with assist from his wife. In today's session, pt was able to tolerate bed mobility with MinAx2 to guide LLE on/off the bed. Support needed to prop L LE in extension once in sitting as pt did not want to attempt flexing L knee. Pt was able to tolerate sitting on EOB for ~5 minutes before requesting to return to supine. Pt has 24/7 assist available upon d/c home. At this time, recommending <3hrs post acute rehab to work towards independence with mobility. Acute PT to follow.       If plan is discharge home, recommend the following: A lot of help with walking and/or transfers;A lot of help with bathing/dressing/bathroom;Assistance with cooking/housework;Assist for transportation;Help with stairs or ramp for entrance   Can travel by private vehicle   No    Equipment Recommendations Wheelchair (measurements PT);Wheelchair cushion (measurements PT);BSC/3in1     Functional Status Assessment Patient has had a recent decline in their functional status and demonstrates the ability to make significant improvements in function in a reasonable and predictable amount of time.     Precautions / Restrictions Precautions Precautions: Fall Recall of Precautions/Restrictions: Intact Restrictions Weight  Bearing Restrictions Per Provider Order: No      Mobility  Bed Mobility Overal bed mobility: Needs Assistance Bed Mobility: Supine to Sit, Sit to Supine    Supine to sit: Min assist, +2 for physical assistance, +2 for safety/equipment Sit to supine: Min assist, +2 for physical assistance, +2 for safety/equipment   General bed mobility comments: Assist to guide LLE on/off EOB and to support LLE once in sitting.    Transfers    General transfer comment: pt declined due to pain       Balance Overall balance assessment: Needs assistance, History of Falls Sitting-balance support: Feet supported, Bilateral upper extremity supported Sitting balance-Leahy Scale: Poor Sitting balance - Comments: reliant on B UE support and support to prop L LE in extension        Pertinent Vitals/Pain Pain Assessment Pain Assessment: Faces Faces Pain Scale: Hurts whole lot Pain Location: L hip, chronic back pain Pain Descriptors / Indicators: Aching, Grimacing, Discomfort Pain Intervention(s): Limited activity within patient's tolerance, Monitored during session, Premedicated before session, Repositioned    Home Living Family/patient expects to be discharged to:: Private residence Living Arrangements: Spouse/significant other Available Help at Discharge: Family;Available 24 hours/day Type of Home: House Home Access: Stairs to enter Entrance Stairs-Rails: Right Entrance Stairs-Number of Steps: 4   Home Layout: One level Home Equipment: Rollator (4 wheels);Cane - single point;Shower seat      Prior Function Prior Level of Function : Independent/Modified Independent    Mobility Comments: Uses rollator vs SPC. Unable to walk in the past few days due to L hip pain. One recent fall with increased difficulty getting up  from the ground ADLs Comments: Ind     Extremity/Trunk Assessment   Upper Extremity Assessment Upper Extremity Assessment: Defer to OT evaluation    Lower Extremity  Assessment Lower Extremity Assessment: LLE deficits/detail LLE Deficits / Details: able to contract quad and ankle DF/PF. Declined knee or hip flexion due to fear of pain. Edema in B feet LLE Sensation: WNL    Cervical / Trunk Assessment Cervical / Trunk Assessment: Kyphotic  Communication   Communication Communication: No apparent difficulties    Cognition Arousal: Alert Behavior During Therapy: WFL for tasks assessed/performed   PT - Cognitive impairments: No apparent impairments    Following commands: Intact       Cueing Cueing Techniques: Verbal cues      PT Assessment Patient needs continued PT services  PT Problem List Decreased strength;Decreased range of motion;Decreased balance;Decreased activity tolerance;Decreased mobility;Pain       PT Treatment Interventions DME instruction;Gait training;Stair training;Functional mobility training;Therapeutic activities;Therapeutic exercise;Balance training;Neuromuscular re-education;Patient/family education    PT Goals (Current goals can be found in the Care Plan section)  Acute Rehab PT Goals Patient Stated Goal: to have less pain when moving PT Goal Formulation: With patient Time For Goal Achievement: 09/02/24 Potential to Achieve Goals: Good    Frequency Min 2X/week     Co-evaluation PT/OT/SLP Co-Evaluation/Treatment: Yes Reason for Co-Treatment: For patient/therapist safety;To address functional/ADL transfers;Complexity of the patient's impairments (multi-system involvement) PT goals addressed during session: Mobility/safety with mobility         AM-PAC PT 6 Clicks Mobility  Outcome Measure Help needed turning from your back to your side while in a flat bed without using bedrails?: A Little Help needed moving from lying on your back to sitting on the side of a flat bed without using bedrails?: A Little Help needed moving to and from a bed to a chair (including a wheelchair)?: Total Help needed standing up  from a chair using your arms (e.g., wheelchair or bedside chair)?: Total Help needed to walk in hospital room?: Total Help needed climbing 3-5 steps with a railing? : Total 6 Click Score: 10    End of Session   Activity Tolerance: Patient limited by pain Patient left: in bed;with call bell/phone within reach Nurse Communication: Mobility status PT Visit Diagnosis: Other abnormalities of gait and mobility (R26.89);History of falling (Z91.81);Pain Pain - Right/Left: Left Pain - part of body: Hip    Time: 0921-0944 PT Time Calculation (min) (ACUTE ONLY): 23 min   Charges:   PT Evaluation $PT Eval Low Complexity: 1 Low   PT General Charges $$ ACUTE PT VISIT: 1 Visit        Kate ORN, PT, DPT Secure Chat Preferred  Rehab Office [phone]   Kate BRAVO Wendolyn 08/19/2024, 10:33 AM

## 2024-08-19 NOTE — Progress Notes (Addendum)
 [facility]                                                                               Gary Wright, is a 78 y.o. male, DOB - 1946-08-07, FMW:[id number] Admit date - 08/18/2024    Outpatient Primary MD for the patient is Lucius Krabbe, NP  LOS - 0  days    Brief summary     Gary Wright is a 78 y.o. 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  According to patient he has been having left hip pain for some time now and follows up with orthopedics as an outpatient.  According to patient he has not had any recent fall, last fall was about 3 months ago.  He used to walk with a walker however he is barely able to walk with a walker at this time as his left hip pain has become more severe within the last few days.  He rates it as 10/10 located in the left hip, partially relieved by pain medication administered in the emergency room.   MRI of the hip with and without contrast done showing   Assessment & Plan    Assessment and Plan:   Acute on chronic left hip pain  Showing osteonecrosis of the left hip   Admitted for pain control currently requiring IV dilaudid and oral norco for pain control. MRI of the left hip showing  Osteolysis of the left femoral head with flattening of the left femoral head and heterogeneous marrow signal throughout the left femoral neck and intertrochanteric region. 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 No acute fracture identified.  Moderate-sized left hip joint effusion with nonspecific synovial thickening. IR consulted for joint aspiration.  Will reach out to orthopedics for further evaluation.    COPD No wheezing  heard. Continue with Breztri and duonebs as needed.    Hypertension Well controlled.    Chronic diastolic CHF He appears compensated. Continue with lasix  20 mg daily.    Estimated body mass index is 27.52 kg/m as calculated from the following:   Height as of this encounter: 5' 8 (1.727 m).   Weight as of this encounter: 82.1 kg.  Code Status: full code.  DVT Prophylaxis:  heparin  injection 5,000 Units Start: 08/18/24 2200   Level of Care: Level of care: Telemetry Family Communication: none at bedside.   Disposition Plan:     Remains inpatient appropriate:  pending pain control.   Procedures:  MRI of the left hip.   Consultants:   Orthopedics.   Antimicrobials:   Anti-infectives (From admission, onward)    None        Medications  Scheduled Meds:  atorvastatin   40 mg Oral QHS   budesonide -glycopyrrolate-formoterol  2 puff Inhalation BID   buPROPion   300 mg Oral Daily   citalopram   40 mg Oral Daily   furosemide   20 mg Oral Daily   heparin   5,000 Units Subcutaneous Q8H  insulin  aspart  0-9 Units Subcutaneous TID WC   irbesartan  75 mg Oral Daily   metoprolol  succinate  12.5 mg Oral Daily   Continuous Infusions: PRN Meds:.HYDROcodone-acetaminophen , HYDROmorphone (DILAUDID) injection, ipratropium-albuterol , pantoprazole     Subjective:   Gary Wright was seen and examined today.  Pain s 8/10 , not well controlled. Unable to bear weight on the left hip.   Objective:   Vitals:   08/19/24 0200 08/19/24 0305 08/19/24 0503 08/19/24 0752  BP: (!) 142/86  134/85 (!) 151/79  Pulse: (!) 101  100 (!) 109  Resp: 13  12 15   Temp:  98.5 F (36.9 C)  97.8 F (36.6 C)  TempSrc:  Oral  Oral  SpO2: 92%  93% 94%  Weight:      Height:       No intake or output data in the 24 hours ending 08/19/24 1021 Filed Weights   08/18/24 1331  Weight: 82.1 kg     Exam General: Alert and oriented x 3, NAD Cardiovascular: S1 S2 auscultated, no murmurs,  RRR Respiratory: Clear to auscultation bilaterally, no wheezing, rales or rhonchi Gastrointestinal: Soft, nontender, nondistended, + bowel sounds Ext: painful ROM of the eft hip. Bilateral pedal edema.  Neuro: AAOx3,   Data Reviewed:  I have personally reviewed following labs and imaging studies   CBC Lab Results  Component Value Date   WBC 11.4 (H) 08/19/2024   RBC 4.21 (L) 08/19/2024   HGB 13.6 08/19/2024   HCT 41.0 08/19/2024   MCV 97.4 08/19/2024   MCH 32.3 08/19/2024   PLT 324 08/19/2024   MCHC 33.2 08/19/2024   RDW 13.7 08/19/2024   LYMPHSABS 0.4 (L) 08/15/2024   MONOABS 0.4 08/15/2024   EOSABS 0.0 08/15/2024   BASOSABS 0.0 08/15/2024     Last metabolic panel Lab Results  Component Value Date   NA 137 08/19/2024   K 3.6 08/19/2024   CL 97 (L) 08/19/2024   CO2 26 08/19/2024   BUN 19 08/19/2024   CREATININE 1.05 08/19/2024   GLUCOSE 114 (H) 08/19/2024   GFRNONAA >60 08/19/2024   GFRAA 57 (L) 12/14/2019   CALCIUM  8.7 (L) 08/19/2024   PHOS 3.5 03/27/2022   PROT 6.0 (L) 08/15/2024   ALBUMIN 3.4 (L) 08/15/2024   LABGLOB 1.8 01/18/2021   AGRATIO 2.6 (H) 01/18/2021   BILITOT 0.7 08/15/2024   ALKPHOS 78 08/15/2024   AST 18 08/15/2024   ALT 27 08/15/2024   ANIONGAP 14 08/19/2024    CBG (last 3)  Recent Labs    08/19/24 0803  GLUCAP 119*      Coagulation Profile: No results for input(s): INR, PROTIME in the last 168 hours.   Radiology Studies: MR HIP LEFT W WO CONTRAST Result Date: 08/19/2024 CLINICAL DATA:  Fracture, hip Worsening left hip pain for 1 week. Abnormality of the left femoral head on CT indeterminate for fracture. EXAM: MRI OF THE LEFT HIP WITHOUT AND WITH CONTRAST TECHNIQUE: Multiplanar, multisequence MR imaging was performed both before and after administration of intravenous contrast. CONTRAST:  8mL GADAVIST GADOBUTROL 1 MMOL/ML IV SOLN COMPARISON:  Left hip radiographs 08/18/2024, 08/15/2024 and 01/29/2024. CT of the left hip  08/18/2024. FINDINGS: Technical note: Despite efforts by the technologist and patient, mild motion artifact is present on today's exam and could not be eliminated. This reduces exam sensitivity and specificity. Bones/Joint/Cartilage As shown on recent radiographs, there is osteolysis of the left femoral head which is new compared with radiographs performed 6 months ago. There is  flattening of the left femoral head with heterogeneous marrow signal throughout the left femoral neck and intertrochanteric region. No acute fracture identified. Probable degenerative reactive edema and enhancement within the left superior acetabulum. There is moderate size left hip joint effusion with associated nonspecific synovial thickening. There are moderate right hip degenerative changes with joint space narrowing, osteophytes and subchondral cyst formation. The bony pelvis and sacroiliac joints otherwise appear unremarkable. There is multilevel lower lumbar spondylosis. Ligaments Not relevant for exam/indication. Muscles and Tendons No acute musculotendinous findings are identified. There is fatty atrophy of the gluteus musculature bilaterally, greater on the left. There is chronic partial tearing of the common hamstring tendons, greater on the right. The piriformis muscles are symmetric. Soft tissue No evidence of periarticular fluid collection or foreign body. Bilateral scrotal hydroceles are noted. IMPRESSION: 1. Osteolysis of the left femoral head with flattening of the left femoral head and heterogeneous marrow signal throughout the left femoral neck and intertrochanteric region. 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 No acute fracture identified. 2. Moderate-sized left hip joint effusion with nonspecific synovial thickening. If concern for infection, consider joint aspiration. 3. Moderate right hip degenerative changes. 4. Chronic partial tearing of the common hamstring  tendons, greater on the right. 5. Bilateral scrotal hydroceles. Electronically Signed   By: Elsie Perone M.D.   On: 08/19/2024 08:50   CT Lumbar Spine Wo Contrast Result Date: 08/18/2024 CLINICAL DATA:  Low back pain EXAM: CT LUMBAR SPINE WITHOUT CONTRAST TECHNIQUE: Multidetector CT imaging of the lumbar spine was performed without intravenous contrast administration. Multiplanar CT image reconstructions were also generated. RADIATION DOSE REDUCTION: This exam was performed according to the departmental dose-optimization program which includes automated exposure control, adjustment of the mA and/or kV according to patient size and/or use of iterative reconstruction technique. COMPARISON:  Lumbar CT 08/15/2024, chest CT 08/02/2024 FINDINGS: Segmentation: Transitional anatomy, same numbering scheme as with comparison study from 08/15/2024. Transitional S1 vertebra. First non rib-bearing vertebra is designated L1. Alignment: Mild scoliosis. Grade 1 anterolisthesis L4 on L5. Trace retrolisthesis L2-L3 and L3-L4. Vertebrae: Vertebral body heights are maintained.  No fracture Paraspinal and other soft tissues: Aortic atherosclerosis. No acute finding. Small left kidney stone. Disc levels: At L1-L2, no high-grade canal stenosis. Mild facet degenerative changes. At L2-L3, disc space narrowing and vacuum disc. No high-grade canal stenosis. Moderate facet degenerative changes. Moderate right greater than left subarticular and foraminal narrowing. At L3-L4, disc space narrowing. Diffuse disc bulge. Moderate hypertrophic facet degenerative changes. Moderate subarticular and foraminal narrowing bilaterally. At L4-L5, disc space narrowing and vacuum discs. Severe hypertrophic facet degenerative changes. Severe canal stenosis and moderate bilateral foraminal narrowing. At L5-S1, disc space narrowing and vacuum disc. Posterior decompression changes. Severe hypertrophic facet degenerative changes. Moderate bilateral foraminal  narrowing. IMPRESSION: 1. Transitional anatomy, same numbering scheme as with comparison study from 08/15/2024. 2. No acute osseous abnormality. 3. Multilevel degenerative changes, most advanced at L4-L5 where there is severe canal stenosis and bilateral foraminal narrowing. 4. Aortic atherosclerosis. Aortic Atherosclerosis (ICD10-I70.0). Electronically Signed   By: Luke Bun M.D.   On: 08/18/2024 17:29   CT Hip Left Wo Contrast Result Date: 08/18/2024 CLINICAL DATA:  Left hip pain. EXAM: CT OF THE LEFT HIP WITHOUT CONTRAST TECHNIQUE: Multidetector CT imaging of the left hip was performed according to the standard protocol. Multiplanar CT image reconstructions were also generated. RADIATION DOSE REDUCTION: This exam was performed according to the departmental dose-optimization program which includes automated exposure control,  adjustment of the mA and/or kV according to patient size and/or use of iterative reconstruction technique. COMPARISON:  Left hip radiograph dated 08/18/2024. FINDINGS: Bones/Joint/Cartilage There is flattening of the left femoral head with cortical fragmentation. There is focal area of cortical angulation involving the basicervical femoral neck (coronal 73/7). An impacted acute fracture is not excluded. There is chronic appearing fragmentation of the posterior left acetabular cortex. An area of periarticular amorphous calcification at the lateral aspect of the hip joint (70/7) noted. There is thickening and irregularity of the joint capsule and synovium. Additional somewhat rounded calcifications along the anteromedial proximal femoral neck noted. There is slight lateral subluxation of the femoral head in relation to the acetabular cup. Faint linear irregularity involving the medial acetabular roof (78/7) may be chronic. An acute fracture is not excluded. The bones are osteopenic. Ligaments Suboptimally assessed by CT. Muscles and Tendons No acute findings. Soft tissues Advanced  atherosclerotic calcification of the aorta. IMPRESSION: 1. Chronic deformity and flattening of the left femoral head may be related to underlying avascular necrosis or chronic infection. An impacted basicervical fracture is not excluded. Orthopedic consult and further evaluation with MRI recommended. 2. 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 3.  Aortic Atherosclerosis (ICD10-I70.0). Electronically Signed   By: Vanetta Chou M.D.   On: 08/18/2024 17:24   DG Chest 1 View Result Date: 08/18/2024 CLINICAL DATA:  Fall. EXAM: CHEST  1 VIEW COMPARISON:  08/15/2024, CT 08/02/2024 FINDINGS: Chronic volume loss in the left hemithorax. Stable heart size and mediastinal contours. Aortic atherosclerosis underlying emphysema. No pneumothorax or significant pleural effusion. None limited assessment, no acute osseous findings. IMPRESSION: 1. No acute chest findings. 2. Chronic volume loss in the left hemithorax. Electronically Signed   By: Andrea Gasman M.D.   On: 08/18/2024 16:33   DG Hip Unilat W or Wo Pelvis 2-3 Views Left Result Date: 08/18/2024 CLINICAL DATA:  Left hip pain.  Pain for over 1 week. EXAM: DG HIP (WITH OR WITHOUT PELVIS) 2-3V LEFT COMPARISON:  Radiograph 3 days ago.  Radiograph 01/29/2024 FINDINGS: Deformity of the left proximal femur and femoral head. Flattening and collapse of the femoral head with lateral subluxation of the femoral shaft and secondary osteoarthritis. Difficult to exclude superimposed fracture as there may be slight increased subluxation. Advanced right hip osteoarthritis with complete joint space loss, subchondral cystic changes and sclerosis. The pubic rami are intact. Prominent vascular calcifications. IMPRESSION: 1. Deformity of the left proximal femur and femoral head with flattening and collapse of the femoral head and secondary osteoarthritis. Difficult to exclude superimposed fracture as there may be slight increased  subluxation. Significant progression from April, findings may represent progressive avascular necrosis or rapidly progressive osteoarthritis. 2. Advanced right hip osteoarthritis. Electronically Signed   By: Andrea Gasman M.D.   On: 08/18/2024 16:31       Elgie Butter M.D. [facility] 08/19/2024, 10:21 AM  Available via Epic secure chat 7am-7pm After 7 pm, please refer to night coverage provider listed on amion.

## 2024-08-19 NOTE — Telephone Encounter (Signed)
 Left in afterhours voicemail.  Dr. Patt at [facility] requesting a stat call from us  regarding this patient. Phone [phone].  Please note, we spoke with ED doctor yesterday and advised Dr. Leonce out of the office, he stated they were going to confer with Ortho.

## 2024-08-19 NOTE — Progress Notes (Signed)
 Received a call about possible inpatient THA for this patient. Explained these are usually done on an outpatient basis but I will talk with my arthroplasty partners to see if they have time to potentially do this as an inpatient. Agree with aspiration to rule out potential septic arthritis causing his femoral head destruction. Ordered ESR and CRP for further work up as well. If it is infected, then would need to do I&D, abx, and abx spacer. If this is AVN, then THA could be done but will have to try to find a partner that has time to do this for the patient.   Gary DELENA Ada, MD Orthopedic Surgeon

## 2024-08-19 NOTE — NC FL2 (Signed)
   MEDICAID FL2 LEVEL OF CARE FORM     IDENTIFICATION  Patient Name: Gary Wright Birthdate: 05/28/1946 Sex: male Admission Date (Current Location): 08/18/2024  [facility] and [place] Number:  Producer, Television/film/video and Address:  [facility], [address] [place], [place] [address]      Provider Number: [id number]  Attending Physician Name and Address:  Cherlyn Labella, MD  Relative Name and Phone Number:  CATALINO, PLASCENCIA (Spouse)  [phone] (Mobile)    Current Level of Care: Hospital Recommended Level of Care: Skilled Nursing Facility Prior Approval Number:    Date Approved/Denied:   PASRR Number: [id number]  Discharge Plan: SNF    Current Diagnoses: Patient Active Problem List   Diagnosis Date Noted   Hip pain 08/18/2024   Leukocytosis 08/05/2024   History of bacteremia 08/05/2024   Pneumonia of right upper lobe due to infectious organism 08/02/2024   SOB (shortness of breath) 06/12/2024   Chronic hip pain, bilateral 01/26/2024   Hoarseness 12/22/2023   Drug-induced diabetes mellitus 11/26/2023   Insomnia due to medical condition 11/26/2023   Fungal skin infection 11/04/2023   Asthma-COPD overlap syndrome (HCC) 11/04/2023   Chronic kidney disease, stage 3b (HCC) 07/31/2023   Allergic asthma 07/28/2023   Allergic rhinitis 07/17/2023   Former smoker 07/17/2023   Hypocalcemia 03/24/2022   Borderline type 2 diabetes mellitus 03/24/2022   COPD with acute exacerbation (HCC) 11/02/2020   History of lung cancer 11/02/2020   Hyperlipidemia 11/02/2020   Acute respiratory failure with hypoxia (HCC) 11/02/2020   Stress-induced cardiomyopathy 06/13/2016   History of pneumonia 06/13/2016   Opioid dependence (HCC) 06/13/2016   Mild CAD 06/13/2016   Depression 09/01/2007   Essential hypertension 09/01/2007   TONSILLECTOMY, HX OF 09/01/2007    Orientation RESPIRATION BLADDER Height & Weight     Self, Time, Situation, Place   Normal Continent Weight: 181 lb (82.1 kg) Height:  5' 8 (172.7 cm)  BEHAVIORAL SYMPTOMS/MOOD NEUROLOGICAL BOWEL NUTRITION STATUS      Continent Diet (see d/c summary)  AMBULATORY STATUS COMMUNICATION OF NEEDS Skin   Extensive Assist Verbally Normal                       Personal Care Assistance Level of Assistance  Bathing, Feeding, Dressing Bathing Assistance: Maximum assistance Feeding assistance: Independent Dressing Assistance: Limited assistance     Functional Limitations Info  Sight, Hearing, Speech Sight Info: Impaired (reading glasses) Hearing Info: Adequate Speech Info: Adequate    SPECIAL CARE FACTORS FREQUENCY  OT (By licensed OT), PT (By licensed PT)     PT Frequency: 5x/week OT Frequency: 5x/week            Contractures Contractures Info: Not present    Additional Factors Info  Code Status, Allergies Code Status Info: Full code Allergies Info: no known allergies           Current Medications (08/19/2024):  This is the current hospital active medication list Current Facility-Administered Medications  Medication Dose Route Frequency Provider Last Rate Last Admin   atorvastatin  (LIPITOR) tablet 40 mg  40 mg Oral QHS Djan, Prince T, MD   40 mg at 08/19/24 0033   budesonide -glycopyrrolate-formoterol (BREZTRI) 160-9-4.8 MCG/ACT inhaler 2 puff  2 puff Inhalation BID Djan, Prince T, MD       buPROPion  (WELLBUTRIN  XL) 24 hr tablet 300 mg  300 mg Oral Daily Djan, Prince T, MD   300 mg at 08/19/24 1031  citalopram  (CELEXA ) tablet 40 mg  40 mg Oral Daily Djan, Prince T, MD   40 mg at 08/19/24 1029   cyclobenzaprine  (FLEXERIL ) tablet 5 mg  5 mg Oral TID PRN Akula, Vijaya, MD   5 mg at 08/19/24 1333   furosemide  (LASIX ) tablet 20 mg  20 mg Oral Daily Djan, Prince T, MD   20 mg at 08/19/24 1032   heparin  injection 5,000 Units  5,000 Units Subcutaneous Q8H Dorinda Drue DASEN, MD   5,000 Units at 08/19/24 1332   HYDROcodone-acetaminophen  (NORCO/VICODIN) 5-325 MG  per tablet 1-2 tablet  1-2 tablet Oral Q4H PRN Akula, Vijaya, MD   2 tablet at 08/19/24 1254   HYDROmorphone (DILAUDID) injection 1 mg  1 mg Intravenous Q3H PRN Akula, Vijaya, MD   1 mg at 08/19/24 1023   insulin  aspart (novoLOG ) injection 0-9 Units  0-9 Units Subcutaneous TID WC Dorinda Drue DASEN, MD   1 Units at 08/19/24 1255   ipratropium-albuterol  (DUONEB) 0.5-2.5 (3) MG/3ML nebulizer solution 3 mL  3 mL Nebulization Q4H PRN Dorinda Drue DASEN, MD       irbesartan (AVAPRO) tablet 75 mg  75 mg Oral Daily Djan, Prince T, MD   75 mg at 08/19/24 1031   metoprolol  succinate (TOPROL -XL) 24 hr tablet 12.5 mg  12.5 mg Oral Daily Djan, Prince T, MD   12.5 mg at 08/19/24 1029   pantoprazole  (PROTONIX ) EC tablet 40 mg  40 mg Oral Daily PRN Dorinda Drue DASEN, MD         Discharge Medications: Please see discharge summary for a list of discharge medications.  Relevant Imaging Results:  Relevant Lab Results:   Additional Information SSN [id number] [place], [place]

## 2024-08-19 NOTE — ED Notes (Signed)
 Patient transported to MRI

## 2024-08-19 NOTE — Telephone Encounter (Signed)
 Pt of Dr. Pietro. This RX doesn't look Dr.  Crenshaw has prescribed or refilled it. Does Dr. Pietro want to refill? Please advise.

## 2024-08-19 NOTE — TOC Initial Note (Signed)
 Transition of Care [facility]) - Initial/Assessment Note    Patient Details  Name: Gary Wright MRN: [id number] Date of Birth: 11-Apr-1946  Transition of Care [facility]) CM/SW Contact:    Luann SHAUNNA Cumming, LCSW Phone Number: 08/19/2024, 1:53 PM  Clinical Narrative:                  CSW met with pt to discuss SNF rec. Pt may get THA during this admission. Pt anticipating he will need short term rehab at SNF following THA and PT also currently recommending SNF. He is agreeable to SNF w/u. CSW explained medicare coverage and auth process. Fl2 completed and bed requests sent in hub.   Expected Discharge Plan: Skilled Nursing Facility Barriers to Discharge: Continued Medical Work up, SNF Pending bed offer           Prior Living Arrangements/Services     Patient language and need for interpreter reviewed:: Yes        Need for Family Participation in Patient Care: No (Comment) Care giver support system in place?: Yes (comment)   Criminal Activity/Legal Involvement Pertinent to Current Situation/Hospitalization: No - Comment as needed  Activities of Daily Living   ADL Screening (condition at time of admission) Independently performs ADLs?: No Does the patient have a NEW difficulty with bathing/dressing/toileting/self-feeding that is expected to last >3 days?: No Does the patient have a NEW difficulty with getting in/out of bed, walking, or climbing stairs that is expected to last >3 days?: Yes (Initiates electronic notice to provider for possible PT consult) Does the patient have a NEW difficulty with communication that is expected to last >3 days?: No Is the patient deaf or have difficulty hearing?: No Does the patient have difficulty seeing, even when wearing glasses/contacts?: No Does the patient have difficulty concentrating, remembering, or making decisions?: No  Permission Sought/Granted                  Emotional Assessment Appearance:: Appears stated  age Attitude/Demeanor/Rapport: Engaged Affect (typically observed): Accepting, Adaptable Orientation: : Oriented to Self, Oriented to Place, Oriented to  Time, Oriented to Situation Alcohol / Substance Use: Not Applicable Psych Involvement: No (comment)  Admission diagnosis:  Sinus tachycardia [R00.0] Hip pain [M25.559] Leg swelling [M79.89] Left hip pain [M25.552] Avascular necrosis of bone of left hip (HCC) [M87.052] Patient Active Problem List   Diagnosis Date Noted   Hip pain 08/18/2024   Leukocytosis 08/05/2024   History of bacteremia 08/05/2024   Pneumonia of right upper lobe due to infectious organism 08/02/2024   SOB (shortness of breath) 06/12/2024   Chronic hip pain, bilateral 01/26/2024   Hoarseness 12/22/2023   Drug-induced diabetes mellitus 11/26/2023   Insomnia due to medical condition 11/26/2023   Fungal skin infection 11/04/2023   Asthma-COPD overlap syndrome (HCC) 11/04/2023   Chronic kidney disease, stage 3b (HCC) 07/31/2023   Allergic asthma 07/28/2023   Allergic rhinitis 07/17/2023   Former smoker 07/17/2023   Hypocalcemia 03/24/2022   Borderline type 2 diabetes mellitus 03/24/2022   COPD with acute exacerbation (HCC) 11/02/2020   History of lung cancer 11/02/2020   Hyperlipidemia 11/02/2020   Acute respiratory failure with hypoxia (HCC) 11/02/2020   Stress-induced cardiomyopathy 06/13/2016   History of pneumonia 06/13/2016   Opioid dependence (HCC) 06/13/2016   Mild CAD 06/13/2016   Depression 09/01/2007   Essential hypertension 09/01/2007   TONSILLECTOMY, HX OF 09/01/2007   PCP:  Lucius Krabbe, NP Pharmacy:   [facility] [phone] [address], [place] - [phone]  [address] AT [facility] [address] & [facility] [address] [place] [address] Phone: [phone] Fax: [phone]  [place] [facility] [address] [place] [address] Phone: [phone] Fax: [phone]  [facility] Sites  - [place], [place] - [address] [place] [address] Phone: [phone] Fax: [phone]  [facility] [address], [place] - [address] [place] [address] Phone: [phone] Fax: [phone]  [facility] [address] [place] [address] Phone: [phone] Fax: [phone]  [facility] [address] [place] [address] Phone: [phone] Fax: [phone]     Social Drivers of Health (SDOH) Social History: SDOH Screenings   Food Insecurity: No Food Insecurity (08/19/2024)  Housing: Low Risk  (08/19/2024)  Transportation Needs: No Transportation Needs (08/19/2024)  Utilities: Not At Risk (08/19/2024)  Alcohol Screen: Low Risk  (11/18/2023)  Depression (PHQ2-9): Medium Risk (11/18/2023)  Financial Resource Strain: Low Risk  (11/18/2023)  Physical Activity: Insufficiently Active (11/18/2023)  Social Connections: Moderately Isolated (08/19/2024)  Stress: No Stress Concern Present (11/18/2023)  Tobacco Use: Medium Risk (08/18/2024)  Health Literacy: Adequate Health Literacy (11/18/2023)   SDOH Interventions:     Readmission Risk Interventions     No data to display

## 2024-08-20 ENCOUNTER — Other Ambulatory Visit (HOSPITAL_COMMUNITY): Payer: Self-pay

## 2024-08-20 ENCOUNTER — Other Ambulatory Visit: Payer: Self-pay

## 2024-08-20 DIAGNOSIS — M87052 Idiopathic aseptic necrosis of left femur: Secondary | ICD-10-CM

## 2024-08-20 DIAGNOSIS — I10 Essential (primary) hypertension: Secondary | ICD-10-CM | POA: Diagnosis not present

## 2024-08-20 DIAGNOSIS — M25552 Pain in left hip: Secondary | ICD-10-CM | POA: Diagnosis not present

## 2024-08-20 LAB — HEMOGLOBIN A1C
Hgb A1c MFr Bld: 7.6 % — ABNORMAL HIGH (ref 4.8–5.6)
Mean Plasma Glucose: 171.42 mg/dL

## 2024-08-20 LAB — GLUCOSE, CAPILLARY
Glucose-Capillary: 135 mg/dL — ABNORMAL HIGH (ref 70–99)
Glucose-Capillary: 149 mg/dL — ABNORMAL HIGH (ref 70–99)
Glucose-Capillary: 91 mg/dL (ref 70–99)
Glucose-Capillary: 149 mg/dL — ABNORMAL HIGH (ref 70–99)

## 2024-08-20 NOTE — TOC Progression Note (Signed)
 Transition of Care [facility]) - Progression Note    Patient Details  Name: Gary Wright MRN: [id number] Date of Birth: 06-29-1946  Transition of Care [facility]) CM/SW Contact  Bridget Cordella Simmonds, LCSW Phone Number: 08/20/2024, 2:41 PM  Clinical Narrative:   Bed offers provided to pt on medicare choice document.     Expected Discharge Plan: Skilled Nursing Facility Barriers to Discharge: Continued Medical Work up, SNF Pending bed offer               Expected Discharge Plan and Services                                               Social Drivers of Health (SDOH) Interventions SDOH Screenings   Food Insecurity: No Food Insecurity (08/19/2024)  Housing: Low Risk  (08/19/2024)  Transportation Needs: No Transportation Needs (08/19/2024)  Utilities: Not At Risk (08/19/2024)  Alcohol Screen: Low Risk  (11/18/2023)  Depression (PHQ2-9): Medium Risk (11/18/2023)  Financial Resource Strain: Low Risk  (11/18/2023)  Physical Activity: Insufficiently Active (11/18/2023)  Social Connections: Moderately Isolated (08/19/2024)  Stress: No Stress Concern Present (11/18/2023)  Tobacco Use: Medium Risk (08/18/2024)  Health Literacy: Adequate Health Literacy (11/18/2023)    Readmission Risk Interventions     No data to display

## 2024-08-20 NOTE — Progress Notes (Signed)
 Specialty Pharmacy Refill Coordination Note  Gary Wright is a 78 y.o. male contacted today regarding refills of specialty medication(s) Dupilumab  (Dupixent )   Patient requested Delivery   Delivery date: 08/25/24   Verified address: [address]   [place] [address]   Medication will be filled on: 08/24/24

## 2024-08-20 NOTE — Plan of Care (Signed)
  Problem: Pain Managment: Goal: General experience of comfort will improve and/or be controlled Outcome: Progressing   Problem: Safety: Goal: Ability to remain free from injury will improve Outcome: Progressing

## 2024-08-20 NOTE — Progress Notes (Signed)
 [facility]                                                                               Robt Okuda, is a 78 y.o. male, DOB - [birth year], FMW:[id number] Admit date - 08/18/2024    Outpatient Primary MD for the patient is Lucius Krabbe, NP  LOS - 1  days    Brief summary     Gary Wright is a 78 y.o. 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  According to patient he has been having left hip pain for some time now and follows up with orthopedics as an outpatient.  According to patient he has not had any recent fall, last fall was about 3 months ago.  He used to walk with a walker however he is barely able to walk with a walker at this time as his left hip pain has become more severe within the last few days.  He rates it as 10/10 located in the left hip, partially relieved by pain medication administered in the emergency room.   MRI of the hip with and without contrast done showing   Assessment & Plan    Assessment and Plan:   Acute on chronic left hip pain  Showing osteonecrosis of the left hip   Admitted for pain control currently requiring IV dilaudid and oral norco for pain control. MRI of the left hip showing  Osteolysis of the left femoral head with flattening of the left femoral head and heterogeneous marrow signal throughout the left femoral neck and intertrochanteric region. 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 No acute fracture identified.  Moderate-sized left hip joint effusion with nonspecific synovial thickening. IR consulted for joint aspiration. Joint aspiration done on 10/30, cultures are negative so far.  Will reach out to  orthopedics for further evaluation.    COPD No wheezing heard. Continue with Breztri and duonebs as needed.    Hypertension Optimal BP parameters.    Chronic diastolic CHF He appears compensated. Continue with lasix  20 mg daily.    Estimated body mass index is 27.52 kg/m as calculated from the following:   Height as of this encounter: 5' 8 (1.727 m).   Weight as of this encounter: 82.1 kg.  Code Status: full code.  DVT Prophylaxis:  heparin  injection 5,000 Units Start: 08/18/24 2200   Level of Care: Level of care: Telemetry Family Communication: none at bedside.   Disposition Plan:     Remains inpatient appropriate:  pending pain control.   Procedures:  MRI of the left hip.   Consultants:   Orthopedics.   Antimicrobials:   Anti-infectives (From admission, onward)    None        Medications  Scheduled Meds:  atorvastatin   40 mg Oral QHS   budesonide -glycopyrrolate-formoterol  2 puff Inhalation BID   buPROPion   300 mg Oral Daily   citalopram   40 mg Oral Daily   furosemide   20  mg Oral Daily   heparin   5,000 Units Subcutaneous Q8H   insulin  aspart  0-9 Units Subcutaneous TID WC   irbesartan  75 mg Oral Daily   metoprolol  succinate  12.5 mg Oral Daily   Continuous Infusions: PRN Meds:.cyclobenzaprine , HYDROcodone-acetaminophen , HYDROmorphone (DILAUDID) injection, ipratropium-albuterol , pantoprazole     Subjective:   Gary Wright was seen and examined today. Still in a lot pain, meds are helping a little.   Objective:   Vitals:   08/20/24 0420 08/20/24 0821 08/20/24 0827 08/20/24 1531  BP: 125/78 115/65  105/76  Pulse: 97 (!) 103 98 88  Resp: 18 18 18 20   Temp: 98 F (36.7 C) 98.5 F (36.9 C)  98.5 F (36.9 C)  TempSrc: Oral Oral    SpO2: 90% 95% 95% 95%  Weight:      Height:        Intake/Output Summary (Last 24 hours) at 08/20/2024 1738 Last data filed at 08/20/2024 1700 Gross per 24 hour  Intake --  Output 600 ml  Net -600 ml    Filed Weights   08/18/24 1331  Weight: 82.1 kg     Exam General exam: Appears calm and comfortable  Respiratory system: Clear to auscultation. Respiratory effort normal. Cardiovascular system: S1 & S2 heard, RRR. Gastrointestinal system: Abdomen is nondistended, soft and nontender.  Central nervous system: Alert and oriented.  Extremities: painful rom of the hip.  Skin: No rashes, Psychiatry:  Mood & affect appropriate.    Data Reviewed:  I have personally reviewed following labs and imaging studies   CBC Lab Results  Component Value Date   WBC 11.4 (H) 08/19/2024   RBC 4.21 (L) 08/19/2024   HGB 13.6 08/19/2024   HCT 41.0 08/19/2024   MCV 97.4 08/19/2024   MCH 32.3 08/19/2024   PLT 324 08/19/2024   MCHC 33.2 08/19/2024   RDW 13.7 08/19/2024   LYMPHSABS 0.4 (L) 08/15/2024   MONOABS 0.4 08/15/2024   EOSABS 0.0 08/15/2024   BASOSABS 0.0 08/15/2024     Last metabolic panel Lab Results  Component Value Date   NA 137 08/19/2024   K 3.6 08/19/2024   CL 97 (L) 08/19/2024   CO2 26 08/19/2024   BUN 19 08/19/2024   CREATININE 1.05 08/19/2024   GLUCOSE 114 (H) 08/19/2024   GFRNONAA >60 08/19/2024   GFRAA 57 (L) 12/14/2019   CALCIUM  8.7 (L) 08/19/2024   PHOS 3.5 03/27/2022   PROT 6.0 (L) 08/15/2024   ALBUMIN 3.4 (L) 08/15/2024   LABGLOB 1.8 01/18/2021   AGRATIO 2.6 (H) 01/18/2021   BILITOT 0.7 08/15/2024   ALKPHOS 78 08/15/2024   AST 18 08/15/2024   ALT 27 08/15/2024   ANIONGAP 14 08/19/2024    CBG (last 3)  Recent Labs    08/20/24 0818 08/20/24 1202 08/20/24 1643  GLUCAP 135* 149* 91      Coagulation Profile: No results for input(s): INR, PROTIME in the last 168 hours.   Radiology Studies: DG FL ASP/INJ MAJOR (SHOULDER, HIP, KNEE) Result Date: 08/19/2024 CLINICAL DATA:  CLINICAL DATA Request for aspiration of L hip joint effusion. EXAM: DIAGNOSTIC RIGHT HIP ASPIRATION UNDER FLUOROSCOPIC GUIDANCE FLUOROSCOPY TIME:  Fluoroscopy Time: 36  seconds Radiation Exposure Index (if provided by the fluoroscopic device): 5 mGy Number of Acquired Spot Images: 1 PROCEDURE: The risks and benefits of the procedure were discussed with the patient, and written informed consent was obtained. The patient stated no history of allergy to contrast media. A formal timeout procedure was performed with  the patient according to departmental protocol. The patient was placed supine on the fluoroscopy table and the left intertrochanteric space was identified under fluoroscopy. The skin overlying the left intertrochanteric space was subsequently cleaned with Chloraprep and a sterile drape was placed over the area of interest. 5 ml 1% Lidocaine  was used to anesthetize the skin around the needle insertion site. A 20 gauge spinal needle was inserted into the left anterior interspace under fluoroscopy. 1 mL of thick, yellow tinged fluid aspirated and sent to lab. The needle was removed and hemostasis was achieved. IMPRESSION: Technically successful right hip aspiration. Performed by Laymon Coast, NP under the supervision of Dr. Jennefer Electronically Signed   By: Ester Jennefer M.D.   On: 08/19/2024 15:09   MR HIP LEFT W WO CONTRAST Result Date: 08/19/2024 CLINICAL DATA:  Fracture, hip Worsening left hip pain for 1 week. Abnormality of the left femoral head on CT indeterminate for fracture. EXAM: MRI OF THE LEFT HIP WITHOUT AND WITH CONTRAST TECHNIQUE: Multiplanar, multisequence MR imaging was performed both before and after administration of intravenous contrast. CONTRAST:  8mL GADAVIST GADOBUTROL 1 MMOL/ML IV SOLN COMPARISON:  Left hip radiographs 08/18/2024, 08/15/2024 and 01/29/2024. CT of the left hip 08/18/2024. FINDINGS: Technical note: Despite efforts by the technologist and patient, mild motion artifact is present on today's exam and could not be eliminated. This reduces exam sensitivity and specificity. Bones/Joint/Cartilage As shown on recent radiographs, there  is osteolysis of the left femoral head which is new compared with radiographs performed 6 months ago. There is flattening of the left femoral head with heterogeneous marrow signal throughout the left femoral neck and intertrochanteric region. No acute fracture identified. Probable degenerative reactive edema and enhancement within the left superior acetabulum. There is moderate size left hip joint effusion with associated nonspecific synovial thickening. There are moderate right hip degenerative changes with joint space narrowing, osteophytes and subchondral cyst formation. The bony pelvis and sacroiliac joints otherwise appear unremarkable. There is multilevel lower lumbar spondylosis. Ligaments Not relevant for exam/indication. Muscles and Tendons No acute musculotendinous findings are identified. There is fatty atrophy of the gluteus musculature bilaterally, greater on the left. There is chronic partial tearing of the common hamstring tendons, greater on the right. The piriformis muscles are symmetric. Soft tissue No evidence of periarticular fluid collection or foreign body. Bilateral scrotal hydroceles are noted. IMPRESSION: 1. Osteolysis of the left femoral head with flattening of the left femoral head and heterogeneous marrow signal throughout the left femoral neck and intertrochanteric region. 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 No acute fracture identified. 2. Moderate-sized left hip joint effusion with nonspecific synovial thickening. If concern for infection, consider joint aspiration. 3. Moderate right hip degenerative changes. 4. Chronic partial tearing of the common hamstring tendons, greater on the right. 5. Bilateral scrotal hydroceles. Electronically Signed   By: Elsie Perone M.D.   On: 08/19/2024 08:50       Elgie Butter M.D. [facility] 08/20/2024, 5:38 PM  Available via Epic secure chat 7am-7pm After 7 pm, please  refer to night coverage provider listed on amion.

## 2024-08-20 NOTE — Consult Note (Addendum)
 ORTHOPAEDIC CONSULTATION  REQUESTING PHYSICIAN: Cherlyn Labella, MD  Chief Complaint: Left hip AVN/infection  HPI: Gary Wright is a 78 y.o. 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  According to patient he has been having left hip pain for some time now and follows up with orthopedics as an outpatient.  According to patient he has not had any recent fall, last fall was about 3 months ago.  He used to walk with a walker however he is barely able to walk with a walker at this time as his left hip pain has become more severe within the last few days.  He rates it as 10/10 located in the left hip, partially relieved by pain medication administered in the emergency room. Patient denies nausea vomiting chest pain cough abdominal pain or urinary complaint. I was asked by my partner, Dr. Georgina, for assistance as he does not perform THA. He lives at home with wife but his pain became too severe and his wife was unable to assist him.    Past Medical History:  Diagnosis Date   Acute dyspnea 03/24/2022   Acute exacerbation of chronic obstructive pulmonary disease (COPD) (HCC) 03/25/2022   Asthma    CAD (coronary artery disease) of artery bypass graft 06/13/2016   Chest pain    COPD (chronic obstructive pulmonary disease) (HCC)    COPD with acute exacerbation (HCC) 11/02/2020   Quit smokig 2019  - PFT's  121/22/23  FEV1 1.83 (60 % ) ratio 0.57  p 15 % improvement from saba p 0 prior to study with DLCO  16.4 (65%)   and FV curve mildly concave  - try off acei and on otc gerd rx 06/02/2023 >>>       Depression 09/01/2007   Qualifier: Diagnosis of  By: Nickola CMA, [place]     Elevated troponin 06/13/2016   History of lung cancer 11/02/2020    Hyperlipidemia 11/02/2020   Hypertension    Prolonged QT interval 11/02/2020   Stress-induced cardiomyopathy 06/13/2016   TOBACCO ABUSE 09/01/2007   Qualifier: Diagnosis of   By: Nickola CMA, [place]         Past Surgical History:  Procedure Laterality Date   BACK SURGERY     fusion in 1973   CARDIAC CATHETERIZATION N/A 06/13/2016   Procedure: Left Heart Cath and Coronary Angiography;  Surgeon: Lonni Hanson, MD;  Location: [facility] INVASIVE CV LAB;  Service: Cardiovascular;  Laterality: N/A;   INGUINAL HERNIA REPAIR Right 04/21/2018   Procedure: RIGHT INGUINAL HERNIA REPAIR ERAS PATHWAY;  Surgeon: Vanderbilt Ned, MD;  Location: [facility] OR;  Service: General;  Laterality: Right;  TAP BLOCK   INSERTION OF MESH Right 04/21/2018   Procedure: INSERTION OF MESH;  Surgeon: Vanderbilt Ned, MD;  Location: [facility] OR;  Service: General;  Laterality: Right;   LUNG REMOVAL, PARTIAL Left    left upper lung removed about 6-7 years ago reported by pt   TONSILLECTOMY     Social History   Socioeconomic History   Marital status: Married    Spouse name: Deland   Number of children: 2   Years of education: Not on file   Highest education level: Not on file  Occupational History   Occupation: retired    Associate Professor: [employer]  Tobacco Use  Smoking status: Former    Current packs/day: 0.00    Types: Cigarettes    Quit date: 2019    Years since quitting: 6.8   Smokeless tobacco: Never  Vaping Use   Vaping status: Never Used  Substance and Sexual Activity   Alcohol use: No    Comment: No alcohol for 17 years   Drug use: No   Sexual activity: Not on file  Other Topics Concern   Not on file  Social History Narrative   Married for 27 years with 3 children   Social Drivers of Corporate Investment Banker Strain: Low Risk  (11/18/2023)   Overall Financial Resource Strain (CARDIA)    Difficulty of Paying Living Expenses: Not hard at all  Food Insecurity: No Food Insecurity (08/19/2024)   Hunger  Vital Sign    Worried About Running Out of Food in the Last Year: Never true    Ran Out of Food in the Last Year: Never true  Transportation Needs: No Transportation Needs (08/19/2024)   PRAPARE - Administrator, Civil Service (Medical): No    Lack of Transportation (Non-Medical): No  Physical Activity: Insufficiently Active (11/18/2023)   Exercise Vital Sign    Days of Exercise per Week: 3 days    Minutes of Exercise per Session: 10 min  Stress: No Stress Concern Present (11/18/2023)   [employer] of Occupational Health - Occupational Stress Questionnaire    Feeling of Stress : Not at all  Social Connections: Moderately Isolated (08/19/2024)   Social Connection and Isolation Panel    Frequency of Communication with Friends and Family: Three times a week    Frequency of Social Gatherings with Friends and Family: Never    Attends Religious Services: Never    Database Administrator or Organizations: No    Attends Engineer, Structural: Never    Marital Status: Married   Family History  Problem Relation Age of Onset   Hypertension Mother    Hypertension Father    No Known Allergies Prior to Admission medications   Medication Sig Start Date End Date Taking? Authorizing Provider  albuterol  (PROVENTIL ) (2.5 MG/3ML) 0.083% nebulizer solution Take 3 mLs (2.5 mg total) by nebulization every 4 (four) hours as needed for wheezing or shortness of breath. 06/09/24  Yes [name] Dorn NOVAK, MD  albuterol  (VENTOLIN  HFA) 108 (90 Base) MCG/ACT inhaler Inhale 1-2 puffs into the lungs every 4 (four) hours as needed for wheezing or shortness of breath. 06/09/24  Yes [name] Dorn NOVAK, MD  atorvastatin  (LIPITOR) 40 MG tablet TAKE 1 TABLET(40 MG) BY MOUTH DAILY AT 6 PM 08/18/23  Yes Pietro Redell RAMAN, MD  budesonide  (PULMICORT ) 0.5 MG/2ML nebulizer solution Take 2 mLs (0.5 mg total) by nebulization 2 (two) times daily. 01/13/24  Yes [name] Dorn NOVAK, MD  buPROPion  (WELLBUTRIN  XL)  300 MG 24 hr tablet Take 300 mg by mouth daily. 05/11/16  Yes [provider]  citalopram  (CELEXA ) 40 MG tablet Take 40 mg by mouth daily. 06/01/16  Yes [provider]  cyclobenzaprine  (FLEXERIL ) 5 MG tablet Take 1 tablet (5 mg total) by mouth 3 (three) times daily as needed for muscle spasms. 03/23/24  Yes Lucius Krabbe, NP  diclofenac Sodium (VOLTAREN) 1 % GEL Apply 4 g topically 4 (four) times daily as needed. Rub on sore joints. 08/09/24  Yes Hudnell, Krabbe, NP  Dupilumab  (DUPIXENT ) 300 MG/2ML SOAJ Inject 300 mg into the skin every 14 (fourteen) days. 07/21/24  Yes  [name] Dorn NOVAK, MD  Fluticasone -Umeclidin-Vilant (TRELEGY ELLIPTA ) 200-62.5-25 MCG/ACT AEPB Inhale 1 puff into the lungs every evening.   Yes [provider]  furosemide  (LASIX ) 20 MG tablet Take 1 tablet (20 mg total) by mouth daily. 08/15/24  Yes Zackowski, Scott, MD  glipiZIDE  (GLUCOTROL  XL) 2.5 MG 24 hr tablet Take 1 tablet (2.5 mg total) by mouth as directed. Take 2 pills in the morning and 1 pill in the evening. 08/09/24  Yes Hudnell, Corean, NP  HYDROcodone-acetaminophen  (NORCO/VICODIN) 5-325 MG tablet Take 1 tablet by mouth every 6 (six) hours as needed. 08/15/24  Yes Scott, Rocky SAILOR, PA-C  ipratropium-albuterol  (DUONEB) 0.5-2.5 (3) MG/3ML SOLN Take 3 mLs by nebulization 4 (four) times daily. 12/22/23  Yes Darlean Ozell NOVAK, MD  metoprolol  succinate (TOPROL -XL) 25 MG 24 hr tablet Take 0.5 tablets (12.5 mg total) by mouth See admin instructions. Take 1/2 tablet (12.5mg ) by mouth once daily at noon. 08/06/24  Yes Arlon Carliss ORN, DO  olmesartan  (BENICAR ) 40 MG tablet Take 1 tablet (40 mg total) by mouth daily. 06/29/24 06/29/25 Yes Darlean Ozell NOVAK, MD  pantoprazole  (PROTONIX ) 40 MG tablet One tablet by mouth daily 30 to 60 minutes before the first meal of the day Patient taking differently: Take 40 mg by mouth daily as needed (heartburn, acid reflux). 05/26/24  Yes Darlean Ozell NOVAK, MD  predniSONE   (DELTASONE ) 10 MG tablet Take 1 tablet (10 mg total) by mouth daily with breakfast. 07/20/24  Yes [name] Dorn NOVAK, MD  triamcinolone  ointment (KENALOG ) 0.5 % Apply topically 2 (two) times daily. Patient taking differently: Apply 1 Application topically 2 (two) times daily as needed (skin irritaiton). 06/24/24  Yes Lucius Corean, NP   DG FL ASP/INJ MAJOR (SHOULDER, HIP, KNEE) Result Date: 08/19/2024 CLINICAL DATA:  CLINICAL DATA Request for aspiration of L hip joint effusion. EXAM: DIAGNOSTIC RIGHT HIP ASPIRATION UNDER FLUOROSCOPIC GUIDANCE FLUOROSCOPY TIME:  Fluoroscopy Time: 36 seconds Radiation Exposure Index (if provided by the fluoroscopic device): 5 mGy Number of Acquired Spot Images: 1 PROCEDURE: The risks and benefits of the procedure were discussed with the patient, and written informed consent was obtained. The patient stated no history of allergy to contrast media. A formal timeout procedure was performed with the patient according to departmental protocol. The patient was placed supine on the fluoroscopy table and the left intertrochanteric space was identified under fluoroscopy. The skin overlying the left intertrochanteric space was subsequently cleaned with Chloraprep and a sterile drape was placed over the area of interest. 5 ml 1% Lidocaine  was used to anesthetize the skin around the needle insertion site. A 20 gauge spinal needle was inserted into the left anterior interspace under fluoroscopy. 1 mL of thick, yellow tinged fluid aspirated and sent to lab. The needle was removed and hemostasis was achieved. IMPRESSION: Technically successful right hip aspiration. Performed by Laymon Coast, NP under the supervision of Dr. Jennefer Electronically Signed   By: Ester Jennefer M.D.   On: 08/19/2024 15:09   MR HIP LEFT W WO CONTRAST Result Date: 08/19/2024 CLINICAL DATA:  Fracture, hip Worsening left hip pain for 1 week. Abnormality of the left femoral head on CT indeterminate for  fracture. EXAM: MRI OF THE LEFT HIP WITHOUT AND WITH CONTRAST TECHNIQUE: Multiplanar, multisequence MR imaging was performed both before and after administration of intravenous contrast. CONTRAST:  8mL GADAVIST GADOBUTROL 1 MMOL/ML IV SOLN COMPARISON:  Left hip radiographs 08/18/2024, 08/15/2024 and 01/29/2024. CT of the left hip 08/18/2024. FINDINGS: Technical note: Despite efforts by  the technologist and patient, mild motion artifact is present on today's exam and could not be eliminated. This reduces exam sensitivity and specificity. Bones/Joint/Cartilage As shown on recent radiographs, there is osteolysis of the left femoral head which is new compared with radiographs performed 6 months ago. There is flattening of the left femoral head with heterogeneous marrow signal throughout the left femoral neck and intertrochanteric region. No acute fracture identified. Probable degenerative reactive edema and enhancement within the left superior acetabulum. There is moderate size left hip joint effusion with associated nonspecific synovial thickening. There are moderate right hip degenerative changes with joint space narrowing, osteophytes and subchondral cyst formation. The bony pelvis and sacroiliac joints otherwise appear unremarkable. There is multilevel lower lumbar spondylosis. Ligaments Not relevant for exam/indication. Muscles and Tendons No acute musculotendinous findings are identified. There is fatty atrophy of the gluteus musculature bilaterally, greater on the left. There is chronic partial tearing of the common hamstring tendons, greater on the right. The piriformis muscles are symmetric. Soft tissue No evidence of periarticular fluid collection or foreign body. Bilateral scrotal hydroceles are noted. IMPRESSION: 1. Osteolysis of the left femoral head with flattening of the left femoral head and heterogeneous marrow signal throughout the left femoral neck and intertrochanteric region. Findings are most  consistent with rapidly progressive femoral head osteolysis or osteonecrosis. No acute fracture identified. 2. Moderate-sized left hip joint effusion with nonspecific synovial thickening. If concern for infection, consider joint aspiration. 3. Moderate right hip degenerative changes. 4. Chronic partial tearing of the common hamstring tendons, greater on the right. 5. Bilateral scrotal hydroceles. Electronically Signed   By: Elsie Perone M.D.   On: 08/19/2024 08:50   CT Lumbar Spine Wo Contrast Result Date: 08/18/2024 CLINICAL DATA:  Low back pain EXAM: CT LUMBAR SPINE WITHOUT CONTRAST TECHNIQUE: Multidetector CT imaging of the lumbar spine was performed without intravenous contrast administration. Multiplanar CT image reconstructions were also generated. RADIATION DOSE REDUCTION: This exam was performed according to the departmental dose-optimization program which includes automated exposure control, adjustment of the mA and/or kV according to patient size and/or use of iterative reconstruction technique. COMPARISON:  Lumbar CT 08/15/2024, chest CT 08/02/2024 FINDINGS: Segmentation: Transitional anatomy, same numbering scheme as with comparison study from 08/15/2024. Transitional S1 vertebra. First non rib-bearing vertebra is designated L1. Alignment: Mild scoliosis. Grade 1 anterolisthesis L4 on L5. Trace retrolisthesis L2-L3 and L3-L4. Vertebrae: Vertebral body heights are maintained.  No fracture Paraspinal and other soft tissues: Aortic atherosclerosis. No acute finding. Small left kidney stone. Disc levels: At L1-L2, no high-grade canal stenosis. Mild facet degenerative changes. At L2-L3, disc space narrowing and vacuum disc. No high-grade canal stenosis. Moderate facet degenerative changes. Moderate right greater than left subarticular and foraminal narrowing. At L3-L4, disc space narrowing. Diffuse disc bulge. Moderate hypertrophic facet degenerative changes. Moderate subarticular and foraminal  narrowing bilaterally. At L4-L5, disc space narrowing and vacuum discs. Severe hypertrophic facet degenerative changes. Severe canal stenosis and moderate bilateral foraminal narrowing. At L5-S1, disc space narrowing and vacuum disc. Posterior decompression changes. Severe hypertrophic facet degenerative changes. Moderate bilateral foraminal narrowing. IMPRESSION: 1. Transitional anatomy, same numbering scheme as with comparison study from 08/15/2024. 2. No acute osseous abnormality. 3. Multilevel degenerative changes, most advanced at L4-L5 where there is severe canal stenosis and bilateral foraminal narrowing. 4. Aortic atherosclerosis. Aortic Atherosclerosis (ICD10-I70.0). Electronically Signed   By: Luke Bun M.D.   On: 08/18/2024 17:29   CT Hip Left Wo Contrast Result Date: 08/18/2024 CLINICAL DATA:  Left hip  pain. EXAM: CT OF THE LEFT HIP WITHOUT CONTRAST TECHNIQUE: Multidetector CT imaging of the left hip was performed according to the standard protocol. Multiplanar CT image reconstructions were also generated. RADIATION DOSE REDUCTION: This exam was performed according to the departmental dose-optimization program which includes automated exposure control, adjustment of the mA and/or kV according to patient size and/or use of iterative reconstruction technique. COMPARISON:  Left hip radiograph dated 08/18/2024. FINDINGS: Bones/Joint/Cartilage There is flattening of the left femoral head with cortical fragmentation. There is focal area of cortical angulation involving the basicervical femoral neck (coronal 73/7). An impacted acute fracture is not excluded. There is chronic appearing fragmentation of the posterior left acetabular cortex. An area of periarticular amorphous calcification at the lateral aspect of the hip joint (70/7) noted. There is thickening and irregularity of the joint capsule and synovium. Additional somewhat rounded calcifications along the anteromedial proximal femoral neck noted.  There is slight lateral subluxation of the femoral head in relation to the acetabular cup. Faint linear irregularity involving the medial acetabular roof (78/7) may be chronic. An acute fracture is not excluded. The bones are osteopenic. Ligaments Suboptimally assessed by CT. Muscles and Tendons No acute findings. Soft tissues Advanced atherosclerotic calcification of the aorta. IMPRESSION: 1. Chronic deformity and flattening of the left femoral head may be related to underlying avascular necrosis or chronic infection. An impacted basicervical fracture is not excluded. Orthopedic consult and further evaluation with MRI recommended. 2. 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 3.  Aortic Atherosclerosis (ICD10-I70.0). Electronically Signed   By: Vanetta Chou M.D.   On: 08/18/2024 17:24   DG Chest 1 View Result Date: 08/18/2024 CLINICAL DATA:  Fall. EXAM: CHEST  1 VIEW COMPARISON:  08/15/2024, CT 08/02/2024 FINDINGS: Chronic volume loss in the left hemithorax. Stable heart size and mediastinal contours. Aortic atherosclerosis underlying emphysema. No pneumothorax or significant pleural effusion. None limited assessment, no acute osseous findings. IMPRESSION: 1. No acute chest findings. 2. Chronic volume loss in the left hemithorax. Electronically Signed   By: Andrea Gasman M.D.   On: 08/18/2024 16:33   DG Hip Unilat W or Wo Pelvis 2-3 Views Left Result Date: 08/18/2024 CLINICAL DATA:  Left hip pain.  Pain for over 1 week. EXAM: DG HIP (WITH OR WITHOUT PELVIS) 2-3V LEFT COMPARISON:  Radiograph 3 days ago.  Radiograph 01/29/2024 FINDINGS: Deformity of the left proximal femur and femoral head. Flattening and collapse of the femoral head with lateral subluxation of the femoral shaft and secondary osteoarthritis. Difficult to exclude superimposed fracture as there may be slight increased subluxation. Advanced right hip osteoarthritis with complete joint space loss,  subchondral cystic changes and sclerosis. The pubic rami are intact. Prominent vascular calcifications. IMPRESSION: 1. Deformity of the left proximal femur and femoral head with flattening and collapse of the femoral head and secondary osteoarthritis. Difficult to exclude superimposed fracture as there may be slight increased subluxation. Significant progression from April, findings may represent progressive avascular necrosis or rapidly progressive osteoarthritis. 2. Advanced right hip osteoarthritis. Electronically Signed   By: Andrea Gasman M.D.   On: 08/18/2024 16:31    All pertinent xrays, MRI, CT independently reviewed and interpreted  Positive ROS: All other systems have been reviewed and were otherwise negative with the exception of those mentioned in the HPI and as above.  Physical Exam: General: No acute distress Cardiovascular: No pedal edema Respiratory: No cyanosis, no use of accessory musculature GI: No organomegaly, abdomen is soft and non-tender Skin: No  lesions in the area of chief complaint Neurologic: Sensation intact distally Psychiatric: Patient is at baseline mood and affect Lymphatic: No axillary or cervical lymphadenopathy  MUSCULOSKELETAL:  - marked pain with movement of the hip  - skin intact - NVI distally - compartments soft  Assessment: Left hip AVN/infection  Plan: - findings thus far point to AVN rather than infection - scant fluid aspirated from hip, cultures NGTD, not enough for cell count - patient in too much pain to safely discharge home and have THA performed on an elective basis - due to scheduling constraints, I will be able to perform surgery Friday 11/7 at [facility] - detailed surgical plan discussed with patient, informed consent obtained - aggressive mobilization with PT/OT in the meantime to minimize deconditioning prior to surgery  Thank you for the consult and the opportunity to see Mr. Jamse BROCKS Ozell Cummins, MD [facility] 10:38 AM

## 2024-08-21 DIAGNOSIS — M25552 Pain in left hip: Secondary | ICD-10-CM | POA: Diagnosis not present

## 2024-08-21 DIAGNOSIS — I10 Essential (primary) hypertension: Secondary | ICD-10-CM | POA: Diagnosis not present

## 2024-08-21 DIAGNOSIS — M87052 Idiopathic aseptic necrosis of left femur: Secondary | ICD-10-CM | POA: Diagnosis not present

## 2024-08-21 LAB — GLUCOSE, CAPILLARY
Glucose-Capillary: 124 mg/dL — ABNORMAL HIGH (ref 70–99)
Glucose-Capillary: 121 mg/dL — ABNORMAL HIGH (ref 70–99)
Glucose-Capillary: 88 mg/dL (ref 70–99)

## 2024-08-21 MED ORDER — SENNOSIDES-DOCUSATE SODIUM 8.6-50 MG PO TABS
2.0000 | ORAL_TABLET | Freq: Two times a day (BID) | ORAL | Status: DC
  Administered 2024-08-21 – 2024-08-29 (×9): 2 via ORAL
  Filled 2024-08-21 (×15): qty 2

## 2024-08-21 MED ORDER — POLYETHYLENE GLYCOL 3350 17 G PO PACK
17.0000 g | PACK | Freq: Every day | ORAL | Status: DC
  Administered 2024-08-21 – 2024-08-29 (×7): 17 g via ORAL
  Filled 2024-08-21 (×10): qty 1

## 2024-08-21 NOTE — Progress Notes (Signed)
 [facility]                                                                               Mahmud Keithly, is a 78 y.o. male, DOB - 24-Sep-1946, FMW:[id number] Admit date - 08/18/2024    Outpatient Primary MD for the patient is Lucius Krabbe, NP  LOS - 2  days    Brief summary     Gary Wright is a 78 y.o. 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  According to patient he has been having left hip pain for some time now and follows up with orthopedics as an outpatient.  According to patient he has not had any recent fall, last fall was about 3 months ago.  He used to walk with a walker however he is barely able to walk with a walker at this time as his left hip pain has become more severe within the last few days.  He rates it as 10/10 located in the left hip, partially relieved by pain medication administered in the emergency room.   MRI of the hip with and without contrast done showing  Osteolysis of the left femoral head with flattening of the left femoral head and heterogeneous marrow signal throughout the left femoral neck and intertrochanteric region. 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 No acute fracture identified. Moderate-sized left hip joint effusion with nonspecific synovial thickening. If concern for infection, consider joint aspiration.  Moderate right hip degenerative changes.  Assessment & Plan    Assessment and Plan:   Acute on chronic left hip pain  Showing osteonecrosis of the left hip   Admitted for pain control currently requiring IV dilaudid and oral norco for pain control. MRI of the left hip showing  Osteolysis of the  left femoral head with flattening of the left femoral head and heterogeneous marrow signal throughout the left femoral neck and intertrochanteric region. 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 No acute fracture identified.  Moderate-sized left hip joint effusion with nonspecific synovial thickening. IR consulted for joint aspiration. Joint aspiration done on 10/30, cultures are negative so far.  Orthopedics consulted , plan for THA as per OR schedule later next week.    COPD No wheezing heard. Continue with Breztri and duonebs as needed.    Hypertension Well controlled.     Chronic diastolic CHF He appears compensated. Continue with lasix  20 mg daily.   Constipation; Added on senna, colace , miralax.    Estimated body mass index is 27.52 kg/m as calculated from the following:   Height as of this encounter: 5' 8 (1.727 m).   Weight as of this encounter: 82.1 kg.  Code Status: full code.  DVT Prophylaxis:  heparin  injection 5,000 Units Start: 08/18/24 2200   Level of Care: Level of care: Telemetry Family Communication: none at bedside.   Disposition Plan:     Remains inpatient appropriate:  pending  pain control.   Procedures:  MRI of the left hip.   Consultants:   Orthopedics.   Antimicrobials:   Anti-infectives (From admission, onward)    None        Medications  Scheduled Meds:  atorvastatin   40 mg Oral QHS   budesonide -glycopyrrolate-formoterol  2 puff Inhalation BID   buPROPion   300 mg Oral Daily   citalopram   40 mg Oral Daily   furosemide   20 mg Oral Daily   heparin   5,000 Units Subcutaneous Q8H   insulin  aspart  0-9 Units Subcutaneous TID WC   irbesartan  75 mg Oral Daily   metoprolol  succinate  12.5 mg Oral Daily   Continuous Infusions: PRN Meds:.cyclobenzaprine , HYDROcodone-acetaminophen , HYDROmorphone (DILAUDID) injection, ipratropium-albuterol , pantoprazole     Subjective:   Gary Wright was seen and examined today no BM for 2 days. Pain is barely manageable with oral meds.   Objective:   Vitals:   08/21/24 0758 08/21/24 1535 08/21/24 1650 08/21/24 1654  BP:  98/82    Pulse:  85  76  Resp:  16    Temp:  98.1 F (36.7 C)    TempSrc:  Oral    SpO2: 97% 98% 92% 100%  Weight:      Height:        Intake/Output Summary (Last 24 hours) at 08/21/2024 1734 Last data filed at 08/21/2024 1457 Gross per 24 hour  Intake 120 ml  Output 825 ml  Net -705 ml   Filed Weights   08/18/24 1331  Weight: 82.1 kg     Exam General exam: Appears calm and comfortable  Respiratory system: Clear to auscultation. Respiratory effort normal. Cardiovascular system: S1 & S2 heard, RRR. No JVD,  Gastrointestinal system: Abdomen is nondistended, soft and nontender.  Central nervous system: Alert and oriented. No focal neurological deficits. Extremities: painful ROM of the left leg. Skin: No rashes, Psychiatry: Judgement and insight appear normal.   Data Reviewed:  I have personally reviewed following labs and imaging studies   CBC Lab Results  Component Value Date   WBC 11.4 (H) 08/19/2024   RBC 4.21 (L) 08/19/2024   HGB 13.6 08/19/2024   HCT 41.0 08/19/2024   MCV 97.4 08/19/2024   MCH 32.3 08/19/2024   PLT 324 08/19/2024   MCHC 33.2 08/19/2024   RDW 13.7 08/19/2024   LYMPHSABS 0.4 (L) 08/15/2024   MONOABS 0.4 08/15/2024   EOSABS 0.0 08/15/2024   BASOSABS 0.0 08/15/2024     Last metabolic panel Lab Results  Component Value Date   NA 137 08/19/2024   K 3.6 08/19/2024   CL 97 (L) 08/19/2024   CO2 26 08/19/2024   BUN 19 08/19/2024   CREATININE 1.05 08/19/2024   GLUCOSE 114 (H) 08/19/2024   GFRNONAA >60 08/19/2024   GFRAA 57 (L) 12/14/2019   CALCIUM  8.7 (L) 08/19/2024   PHOS 3.5 03/27/2022   PROT 6.0 (L) 08/15/2024   ALBUMIN 3.4 (L) 08/15/2024   LABGLOB 1.8 01/18/2021   AGRATIO 2.6 (H) 01/18/2021   BILITOT 0.7 08/15/2024   ALKPHOS 78 08/15/2024   AST 18  08/15/2024   ALT 27 08/15/2024   ANIONGAP 14 08/19/2024    CBG (last 3)  Recent Labs    08/20/24 2125 08/21/24 1157 08/21/24 1608  GLUCAP 149* 124* 121*      Coagulation Profile: No results for input(s): INR, PROTIME in the last 168 hours.   Radiology Studies: No results found.      Elgie Butter M.D. [facility]  08/21/2024, 5:34 PM  Available via Epic secure chat 7am-7pm After 7 pm, please refer to night coverage provider listed on amion.

## 2024-08-21 NOTE — Plan of Care (Signed)
  Problem: Education: Goal: Ability to describe self-care measures that may prevent or decrease complications (Diabetes Survival Skills Education) will improve Outcome: Progressing Goal: Individualized Educational Video(s) Outcome: Progressing   Problem: Activity: Goal: Risk for activity intolerance will decrease Outcome: Progressing   Problem: Pain Managment: Goal: General experience of comfort will improve and/or be controlled Outcome: Progressing

## 2024-08-22 DIAGNOSIS — M87052 Idiopathic aseptic necrosis of left femur: Secondary | ICD-10-CM | POA: Diagnosis not present

## 2024-08-22 DIAGNOSIS — M7989 Other specified soft tissue disorders: Secondary | ICD-10-CM | POA: Diagnosis not present

## 2024-08-22 DIAGNOSIS — M25552 Pain in left hip: Secondary | ICD-10-CM | POA: Diagnosis not present

## 2024-08-22 LAB — BODY FLUID CULTURE W GRAM STAIN
Culture: NO GROWTH
Gram Stain: NONE SEEN
Special Requests: NORMAL

## 2024-08-22 LAB — GLUCOSE, CAPILLARY
Glucose-Capillary: 187 mg/dL — ABNORMAL HIGH (ref 70–99)
Glucose-Capillary: 125 mg/dL — ABNORMAL HIGH (ref 70–99)
Glucose-Capillary: 121 mg/dL — ABNORMAL HIGH (ref 70–99)
Glucose-Capillary: 164 mg/dL — ABNORMAL HIGH (ref 70–99)
Glucose-Capillary: 117 mg/dL — ABNORMAL HIGH (ref 70–99)

## 2024-08-22 NOTE — Plan of Care (Signed)
  Problem: Education: Goal: Ability to describe self-care measures that may prevent or decrease complications (Diabetes Survival Skills Education) will improve Outcome: Progressing   Problem: Coping: Goal: Ability to adjust to condition or change in health will improve Outcome: Progressing   Problem: Health Behavior/Discharge Planning: Goal: Ability to identify and utilize available resources and services will improve Outcome: Progressing   Problem: Nutritional: Goal: Maintenance of adequate nutrition will improve Outcome: Progressing   Problem: Nutritional: Goal: Maintenance of adequate nutrition will improve Outcome: Progressing   Problem: Nutritional: Goal: Maintenance of adequate nutrition will improve Outcome: Progressing   Problem: Skin Integrity: Goal: Risk for impaired skin integrity will decrease Outcome: Progressing

## 2024-08-22 NOTE — TOC Progression Note (Addendum)
 Transition of Care [facility]) - Progression Note    Patient Details  Name: Gary Wright MRN: [id number] Date of Birth: [birth year]  [facility]) CM/SW Contact  Isaiah Public, LCSWA Phone Number: 08/22/2024, 2:08 PM  Clinical Narrative:     CSW provided SNF  bed offers to patient . Patient accepted SNF bed offer with [facility]. CSW awaiting to hear back from Star with [facility] to confirm SNF bed for patient. CSW following to start auth for SNF.   Update- CSW received call back from Star with [facility] she informed CSW to follow back up with facility closer to patient being medically ready to confirm SNF bed. CSW will follow back up closer to patient medically ready to secure SNF bed for patient.  Expected Discharge Plan: Skilled Nursing Facility Barriers to Discharge: Continued Medical Work up, SNF Pending bed offer               Expected Discharge Plan and Services                                               Social Drivers of Health (SDOH) Interventions SDOH Screenings   Food Insecurity: No Food Insecurity (08/19/2024)  Housing: Low Risk  (08/19/2024)  Transportation Needs: No Transportation Needs (08/19/2024)  Utilities: Not At Risk (08/19/2024)  Alcohol Screen: Low Risk  (11/18/2023)  Depression (PHQ2-9): Medium Risk (11/18/2023)  Financial Resource Strain: Low Risk  (11/18/2023)  Physical Activity: Insufficiently Active (11/18/2023)  Social Connections: Moderately Isolated (08/19/2024)  Stress: No Stress Concern Present (11/18/2023)  Tobacco Use: Medium Risk (08/18/2024)  Health Literacy: Adequate Health Literacy (11/18/2023)    Readmission Risk Interventions     No data to display

## 2024-08-22 NOTE — Progress Notes (Signed)
 [facility]                                                                               Gary Wright, is a 78 y.o. male, DOB - 09-Oct-1946, FMW:[id number] Admit date - 08/18/2024    Outpatient Primary MD for the patient is Lucius Krabbe, NP  LOS - 3  days    Brief summary     Gary Wright is a 78 y.o. 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  According to patient he has been having left hip pain for some time now and follows up with orthopedics as an outpatient.  According to patient he has not had any recent fall, last fall was about 3 months ago.  He used to walk with a walker however he is barely able to walk with a walker at this time as his left hip pain has become more severe within the last few days.  He rates it as 10/10 located in the left hip, partially relieved by pain medication administered in the emergency room.   MRI of the hip with and without contrast done showing  Osteolysis of the left femoral head with flattening of the left femoral head and heterogeneous marrow signal throughout the left femoral neck and intertrochanteric region. 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 No acute fracture identified. Moderate-sized left hip joint effusion with nonspecific synovial thickening. If concern for infection, consider joint aspiration.  Moderate right hip degenerative changes.  Assessment & Plan    Assessment and Plan:   Acute on chronic left hip pain  Showing osteonecrosis of the left hip   Admitted for pain control currently requiring IV dilaudid and oral norco for pain control. MRI of the left hip showing  Osteolysis of the  left femoral head with flattening of the left femoral head and heterogeneous marrow signal throughout the left femoral neck and intertrochanteric region. 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 No acute fracture identified.  Moderate-sized left hip joint effusion with nonspecific synovial thickening. IR consulted for joint aspiration. Joint aspiration done on 10/30, cultures are negative so far.  Orthopedics consulted , plan for THA as per OR schedule later next week.  Therapy eval recommeding SNF.   COPD No wheezing heard. Continue with Breztri and duonebs as needed.    Hypertension Optimal.    Chronic diastolic CHF He appears compensated. Continue with lasix  20 mg daily.   Constipation; Added on senna, colace , miralax.  NO bm YET    Estimated body mass index is 27.52 kg/m as calculated from the following:   Height as of this encounter: 5' 8 (1.727 m).   Weight as of this encounter: 82.1 kg.  Code Status: full code.  DVT Prophylaxis:  heparin  injection 5,000 Units Start: 08/18/24 2200   Level of Care: Level of care: Telemetry Family Communication: none at bedside.   Disposition Plan:  Remains inpatient appropriate:  pending pain control.   Procedures:  MRI of the left hip.   Consultants:   Orthopedics.   Antimicrobials:   Anti-infectives (From admission, onward)    None        Medications  Scheduled Meds:  atorvastatin   40 mg Oral QHS   budesonide -glycopyrrolate-formoterol  2 puff Inhalation BID   buPROPion   300 mg Oral Daily   citalopram   40 mg Oral Daily   furosemide   20 mg Oral Daily   heparin   5,000 Units Subcutaneous Q8H   insulin  aspart  0-9 Units Subcutaneous TID WC   irbesartan  75 mg Oral Daily   metoprolol  succinate  12.5 mg Oral Daily   polyethylene glycol  17 g Oral Daily   senna-docusate  2 tablet Oral BID   Continuous Infusions: PRN Meds:.cyclobenzaprine ,  HYDROcodone-acetaminophen , HYDROmorphone (DILAUDID) injection, ipratropium-albuterol , pantoprazole     Subjective:   Gary Wright was seen and examined today  NO NEW COMPLAINTS.   Objective:   Vitals:   08/21/24 1654 08/21/24 2000 08/21/24 2041 08/22/24 0520  BP:  (!) 106/58  (!) 147/82  Pulse: 76 88 69 94  Resp:  17 16 18   Temp:  98.2 F (36.8 C)  97.9 F (36.6 C)  TempSrc:  Oral  Oral  SpO2: 100% 93% 95% 96%  Weight:      Height:        Intake/Output Summary (Last 24 hours) at 08/22/2024 1505 Last data filed at 08/22/2024 0937 Gross per 24 hour  Intake --  Output 475 ml  Net -475 ml   Filed Weights   08/18/24 1331  Weight: 82.1 kg     Exam General exam: Appears calm and comfortable  Respiratory system: Clear to auscultation. Respiratory effort normal. Cardiovascular system: S1 & S2 heard, RRR.  Gastrointestinal system: Abdomen is nondistended, soft and nontender. Central nervous system: Alert and oriented.  Extremities: painful ROM of the lower extremity on the left.  Skin: No rashes,  Psychiatry:Mood & affect appropriate.    Data Reviewed:  I have personally reviewed following labs and imaging studies   CBC Lab Results  Component Value Date   WBC 11.4 (H) 08/19/2024   RBC 4.21 (L) 08/19/2024   HGB 13.6 08/19/2024   HCT 41.0 08/19/2024   MCV 97.4 08/19/2024   MCH 32.3 08/19/2024   PLT 324 08/19/2024   MCHC 33.2 08/19/2024   RDW 13.7 08/19/2024   LYMPHSABS 0.4 (L) 08/15/2024   MONOABS 0.4 08/15/2024   EOSABS 0.0 08/15/2024   BASOSABS 0.0 08/15/2024     Last metabolic panel Lab Results  Component Value Date   NA 137 08/19/2024   K 3.6 08/19/2024   CL 97 (L) 08/19/2024   CO2 26 08/19/2024   BUN 19 08/19/2024   CREATININE 1.05 08/19/2024   GLUCOSE 114 (H) 08/19/2024   GFRNONAA >60 08/19/2024   GFRAA 57 (L) 12/14/2019   CALCIUM  8.7 (L) 08/19/2024   PHOS 3.5 03/27/2022   PROT 6.0 (L) 08/15/2024   ALBUMIN 3.4 (L) 08/15/2024   LABGLOB 1.8  01/18/2021   AGRATIO 2.6 (H) 01/18/2021   BILITOT 0.7 08/15/2024   ALKPHOS 78 08/15/2024   AST 18 08/15/2024   ALT 27 08/15/2024   ANIONGAP 14 08/19/2024    CBG (last 3)  Recent Labs    08/21/24 2143 08/22/24 0637 08/22/24 1223  GLUCAP 88 125* 121*      Coagulation Profile: No results for input(s): INR, PROTIME in the last 168 hours.  Radiology Studies: No results found.      Elgie Butter M.D. [facility] 08/22/2024, 3:05 PM  Available via Epic secure chat 7am-7pm After 7 pm, please refer to night coverage provider listed on amion.

## 2024-08-23 ENCOUNTER — Other Ambulatory Visit

## 2024-08-23 DIAGNOSIS — M25552 Pain in left hip: Secondary | ICD-10-CM | POA: Diagnosis not present

## 2024-08-23 DIAGNOSIS — M87052 Idiopathic aseptic necrosis of left femur: Secondary | ICD-10-CM | POA: Diagnosis not present

## 2024-08-23 DIAGNOSIS — M7989 Other specified soft tissue disorders: Secondary | ICD-10-CM | POA: Diagnosis not present

## 2024-08-23 LAB — GLUCOSE, CAPILLARY
Glucose-Capillary: 135 mg/dL — ABNORMAL HIGH (ref 70–99)
Glucose-Capillary: 139 mg/dL — ABNORMAL HIGH (ref 70–99)
Glucose-Capillary: 156 mg/dL — ABNORMAL HIGH (ref 70–99)
Glucose-Capillary: 112 mg/dL — ABNORMAL HIGH (ref 70–99)

## 2024-08-23 NOTE — Progress Notes (Signed)
 [facility]                                                                               Armand Preast, is a 78 y.o. male, DOB - 09-08-1946, FMW:[id number] Admit date - 08/18/2024    Outpatient Primary MD for the patient is Lucius Krabbe, NP  LOS - 4  days    Brief summary     Gary Wright is a 78 y.o. 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  According to patient he has been having left hip pain for some time now and follows up with orthopedics as an outpatient.  According to patient he has not had any recent fall, last fall was about 3 months ago.  He used to walk with a walker however he is barely able to walk with a walker at this time as his left hip pain has become more severe within the last few days.  He rates it as 10/10 located in the left hip, partially relieved by pain medication administered in the emergency room.   MRI of the hip with and without contrast done showing  Osteolysis of the left femoral head with flattening of the left femoral head and heterogeneous marrow signal throughout the left femoral neck and intertrochanteric region. 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 No acute fracture identified. Moderate-sized left hip joint effusion with nonspecific synovial thickening. If concern for infection, consider joint aspiration.  Moderate right hip degenerative changes.  Patient underwent arthrocentesis on the left and fluid cultures are negative.   Assessment & Plan    Assessment and Plan:   Acute on chronic left hip pain  Showing osteonecrosis of the left hip   Admitted for pain control currently requiring IV dilaudid  and oral norco for pain control. MRI of the left hip showing  Osteolysis of the left femoral head with flattening of the left femoral head and heterogeneous marrow signal throughout the left femoral neck and intertrochanteric region. 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 No acute fracture identified.  Moderate-sized left hip joint effusion with nonspecific synovial thickening. IR consulted for joint aspiration. Joint aspiration done on 10/30, cultures are negative so far.  Orthopedics consulted , plan for THA as per OR schedule later next week.  Therapy eval recommeding SNF.  Patient worked with PT and he is sitting in the chair today. Pain better controlled.   COPD No wheezing heard. Continue with Breztri and duonebs as needed.    Hypertension Well controlled.    Chronic diastolic CHF He appears compensated. Continue with lasix  20 mg daily.   Constipation; Resolved. Had  a BM.    Estimated body mass index is 27.52 kg/m as calculated from the following:   Height as of this encounter: 5' 8 (1.727 m).   Weight as of this encounter: 82.1 kg.  Code Status: full code.  DVT Prophylaxis:  heparin  injection 5,000  Units Start: 08/18/24 2200   Level of Care: Level of care: Telemetry Family Communication: none at bedside.   Disposition Plan:     Remains inpatient appropriate:  tha scheduled on Friday.   Procedures:  MRI of the left hip.   Consultants:   Orthopedics.   Antimicrobials:   Anti-infectives (From admission, onward)    None        Medications  Scheduled Meds:  atorvastatin   40 mg Oral QHS   budesonide -glycopyrrolate-formoterol  2 puff Inhalation BID   buPROPion   300 mg Oral Daily   citalopram   40 mg Oral Daily   furosemide   20 mg Oral Daily   heparin   5,000 Units Subcutaneous Q8H   insulin  aspart  0-9 Units Subcutaneous TID WC   irbesartan  75 mg Oral Daily   metoprolol  succinate  12.5 mg Oral  Daily   polyethylene glycol  17 g Oral Daily   senna-docusate  2 tablet Oral BID   Continuous Infusions: PRN Meds:.cyclobenzaprine , HYDROcodone-acetaminophen , HYDROmorphone (DILAUDID) injection, ipratropium-albuterol , pantoprazole     Subjective:   Gary Wright was seen and examined today  had a BM , no new complaints. Sitting in the chair today.   Objective:   Vitals:   08/22/24 1700 08/22/24 2152 08/23/24 0613 08/23/24 0814  BP: 123/70  134/79 (!) 116/56  Pulse: 81  85 100  Resp:   18 16  Temp: 97.9 F (36.6 C)  98.4 F (36.9 C) 97.6 F (36.4 C)  TempSrc: Oral  Oral Oral  SpO2: 100% 97%  98%  Weight:      Height:        Intake/Output Summary (Last 24 hours) at 08/23/2024 1501 Last data filed at 08/23/2024 1300 Gross per 24 hour  Intake 480 ml  Output 300 ml  Net 180 ml   Filed Weights   08/18/24 1331  Weight: 82.1 kg     Exam General exam: Appears calm and comfortable  Respiratory system: Clear to auscultation. Respiratory effort normal. Cardiovascular system: S1 & S2 heard, RRR.  Gastrointestinal system: Abdomen is soft bs+ Central nervous system: Alert and oriented. No focal neurological deficits. Extremities: no pedal edema.  Skin: No rashes, Psychiatry: Mood & affect appropriate.    Data Reviewed:  I have personally reviewed following labs and imaging studies   CBC Lab Results  Component Value Date   WBC 11.4 (H) 08/19/2024   RBC 4.21 (L) 08/19/2024   HGB 13.6 08/19/2024   HCT 41.0 08/19/2024   MCV 97.4 08/19/2024   MCH 32.3 08/19/2024   PLT 324 08/19/2024   MCHC 33.2 08/19/2024   RDW 13.7 08/19/2024   LYMPHSABS 0.4 (L) 08/15/2024   MONOABS 0.4 08/15/2024   EOSABS 0.0 08/15/2024   BASOSABS 0.0 08/15/2024     Last metabolic panel Lab Results  Component Value Date   NA 137 08/19/2024   K 3.6 08/19/2024   CL 97 (L) 08/19/2024   CO2 26 08/19/2024   BUN 19 08/19/2024   CREATININE 1.05 08/19/2024   GLUCOSE 114 (H) 08/19/2024    GFRNONAA >60 08/19/2024   GFRAA 57 (L) 12/14/2019   CALCIUM  8.7 (L) 08/19/2024   PHOS 3.5 03/27/2022   PROT 6.0 (L) 08/15/2024   ALBUMIN 3.4 (L) 08/15/2024   LABGLOB 1.8 01/18/2021   AGRATIO 2.6 (H) 01/18/2021   BILITOT 0.7 08/15/2024   ALKPHOS 78 08/15/2024   AST 18 08/15/2024   ALT 27 08/15/2024   ANIONGAP 14 08/19/2024    CBG (last 3)  Recent  Labs    08/22/24 2128 08/23/24 0641 08/23/24 1134  GLUCAP 117* 135* 139*      Coagulation Profile: No results for input(s): INR, PROTIME in the last 168 hours.   Radiology Studies: No results found.      Elgie Butter M.D. [facility] 08/23/2024, 3:01 PM  Available via Epic secure chat 7am-7pm After 7 pm, please refer to night coverage provider listed on amion.

## 2024-08-23 NOTE — Plan of Care (Signed)
   Problem: Activity: Goal: Risk for activity intolerance will decrease Outcome: Progressing   Problem: Nutrition: Goal: Adequate nutrition will be maintained Outcome: Progressing   Problem: Pain Managment: Goal: General experience of comfort will improve and/or be controlled Outcome: Progressing   Problem: Safety: Goal: Ability to remain free from injury will improve Outcome: Progressing

## 2024-08-23 NOTE — Progress Notes (Signed)
 Physical Therapy Treatment Patient Details Name: Gary Wright MRN: [id number] DOB: [birth year] Today's Date: 08/23/2024   History of Present Illness 78 y.o. male presents to [facility] 08/18/24 with severe L hip pain. 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 Also with L hip joint effusion w/ plan for joint aspiration. Recent prior admits due to SOB with hypoxia. PMH positive for stage IV COPD, CAD s/p CABG, HTN, HLD, DM.    PT Comments  Pt resting in bed on arrival, pleasant and eager for mobility and demonstrating steady progress towards acute goals. Pt able to complete bed mobility with increased time and gait belt as left lifter with CGA for safety. Pt progressing OOB mobility this session with grossly min A to complete transfers sit<>stand and short in room ambulation with RW for support. Pt continues to be limited in safe mobility by pain, decreased activity tolerance, general weakness and decreased balance. Pt continues to benefit from skilled PT services to progress toward functional mobility goals.      If plan is discharge home, recommend the following: A lot of help with walking and/or transfers;A lot of help with bathing/dressing/bathroom;Assistance with cooking/housework;Assist for transportation;Help with stairs or ramp for entrance   Can travel by private vehicle     No  Equipment Recommendations  Wheelchair (measurements PT);Wheelchair cushion (measurements PT);BSC/3in1    Recommendations for Other Services       Precautions / Restrictions Precautions Precautions: Fall Recall of Precautions/Restrictions: Intact Precaution/Restrictions Comments: watch O2 Restrictions Weight Bearing Restrictions Per Provider Order: No     Mobility  Bed Mobility Overal bed mobility: Needs Assistance Bed Mobility: Supine to Sit     Supine to sit: Contact guard, HOB elevated, Used rails     General bed mobility  comments: pt utilizing gait belt as leg lifter, with increased time able to self mobilize to L EOB without assist with HOB elevated significantly    Transfers Overall transfer level: Needs assistance Equipment used: Rolling walker (2 wheels) Transfers: Sit to/from Stand Sit to Stand: Min assist           General transfer comment: min A to boost to stand from EOB at lowest height, dense cues for hand placement with pt still pulling up on RW on rise    Ambulation/Gait Ambulation/Gait assistance: Min assist Gait Distance (Feet): 5 Feet Assistive device: Rolling walker (2 wheels) Gait Pattern/deviations: Decreased stride length, Trunk flexed, Step-to pattern, Decreased stance time - left, Decreased weight shift to left Gait velocity: reduced     General Gait Details: Pt taking short antalgic steps with step to pattern and decreased stance on L due to pain. Cues for increased UE use to offload LEs   Stairs             Wheelchair Mobility     Tilt Bed    Modified Rankin (Stroke Patients Only)       Balance Overall balance assessment: Needs assistance, History of Falls Sitting-balance support: Feet supported, Bilateral upper extremity supported Sitting balance-Leahy Scale: Fair     Standing balance support: Bilateral upper extremity supported, During functional activity Standing balance-Leahy Scale: Poor Standing balance comment: reliant on RW support                            Communication Communication Communication: No apparent difficulties  Cognition Arousal: Alert Behavior During Therapy: WFL for tasks assessed/performed   PT -  Cognitive impairments: No apparent impairments                         Following commands: Intact      Cueing Cueing Techniques: Verbal cues  Exercises      General Comments General comments (skin integrity, edema, etc.): VSS on RA      Pertinent Vitals/Pain Pain Assessment Pain Assessment:  Faces Faces Pain Scale: Hurts even more Pain Location: L hip Pain Descriptors / Indicators: Aching, Grimacing, Discomfort Pain Intervention(s): Monitored during session, Limited activity within patient's tolerance, Repositioned    Home Living                          Prior Function            PT Goals (current goals can now be found in the care plan section) Acute Rehab PT Goals Patient Stated Goal: to have less pain when moving PT Goal Formulation: With patient Time For Goal Achievement: 09/02/24 Progress towards PT goals: Progressing toward goals    Frequency    Min 2X/week      PT Plan      Co-evaluation              AM-PAC PT 6 Clicks Mobility   Outcome Measure  Help needed turning from your back to your side while in a flat bed without using bedrails?: A Little Help needed moving from lying on your back to sitting on the side of a flat bed without using bedrails?: A Little Help needed moving to and from a bed to a chair (including a wheelchair)?: A Little Help needed standing up from a chair using your arms (e.g., wheelchair or bedside chair)?: A Little Help needed to walk in hospital room?: Total (<20') Help needed climbing 3-5 steps with a railing? : Total 6 Click Score: 14    End of Session   Activity Tolerance: Patient limited by pain;Patient tolerated treatment well Patient left: with call bell/phone within reach;in chair;with chair alarm set Nurse Communication: Mobility status PT Visit Diagnosis: Other abnormalities of gait and mobility (R26.89);History of falling (Z91.81);Pain Pain - Right/Left: Left Pain - part of body: Hip     Time: 1220-1239 PT Time Calculation (min) (ACUTE ONLY): 19 min  Charges:    $Therapeutic Activity: 8-22 mins PT General Charges $$ ACUTE PT VISIT: 1 Visit                     Neeti Knudtson R. PTA Acute Rehabilitation Services Office: [phone]   Therisa CHRISTELLA Boor 08/23/2024, 1:51 PM

## 2024-08-24 ENCOUNTER — Ambulatory Visit (HOSPITAL_COMMUNITY)

## 2024-08-24 ENCOUNTER — Other Ambulatory Visit: Payer: Self-pay

## 2024-08-24 DIAGNOSIS — I10 Essential (primary) hypertension: Secondary | ICD-10-CM | POA: Diagnosis not present

## 2024-08-24 DIAGNOSIS — M87052 Idiopathic aseptic necrosis of left femur: Secondary | ICD-10-CM | POA: Diagnosis not present

## 2024-08-24 DIAGNOSIS — M25552 Pain in left hip: Secondary | ICD-10-CM | POA: Diagnosis not present

## 2024-08-24 LAB — BASIC METABOLIC PANEL WITH GFR
Anion gap: 11 (ref 5–15)
BUN: 23 mg/dL (ref 8–23)
CO2: 25 mmol/L (ref 22–32)
Calcium: 8.8 mg/dL — ABNORMAL LOW (ref 8.9–10.3)
Chloride: 97 mmol/L — ABNORMAL LOW (ref 98–111)
Creatinine, Ser: 1.26 mg/dL — ABNORMAL HIGH (ref 0.61–1.24)
GFR, Estimated: 58 mL/min — ABNORMAL LOW (ref >60)
Glucose, Bld: 117 mg/dL — ABNORMAL HIGH (ref 70–99)
Potassium: 4.2 mmol/L (ref 3.5–5.1)
Sodium: 133 mmol/L — ABNORMAL LOW (ref 135–145)

## 2024-08-24 LAB — CBC WITH DIFFERENTIAL/PLATELET
Abs Immature Granulocytes: 0.24 K/uL — ABNORMAL HIGH (ref 0.00–0.07)
Basophils Absolute: 0.1 K/uL (ref 0.0–0.1)
Basophils Relative: 1 %
Eosinophils Absolute: 0.2 K/uL (ref 0.0–0.5)
Eosinophils Relative: 2 %
HCT: 38.2 % — ABNORMAL LOW (ref 39.0–52.0)
Hemoglobin: 12.5 g/dL — ABNORMAL LOW (ref 13.0–17.0)
Immature Granulocytes: 3 %
Lymphocytes Relative: 16 %
Lymphs Abs: 1.4 K/uL (ref 0.7–4.0)
MCH: 31.4 pg (ref 26.0–34.0)
MCHC: 32.7 g/dL (ref 30.0–36.0)
MCV: 96 fL (ref 80.0–100.0)
Monocytes Absolute: 0.9 K/uL (ref 0.1–1.0)
Monocytes Relative: 10 %
Neutro Abs: 6.1 K/uL (ref 1.7–7.7)
Neutrophils Relative %: 68 %
Platelets: 236 K/uL (ref 150–400)
RBC: 3.98 MIL/uL — ABNORMAL LOW (ref 4.22–5.81)
RDW: 13.5 % (ref 11.5–15.5)
WBC: 8.8 K/uL (ref 4.0–10.5)
nRBC: 0 % (ref 0.0–0.2)

## 2024-08-24 LAB — GLUCOSE, CAPILLARY
Glucose-Capillary: 117 mg/dL — ABNORMAL HIGH (ref 70–99)
Glucose-Capillary: 159 mg/dL — ABNORMAL HIGH (ref 70–99)
Glucose-Capillary: 142 mg/dL — ABNORMAL HIGH (ref 70–99)
Glucose-Capillary: 137 mg/dL — ABNORMAL HIGH (ref 70–99)
Glucose-Capillary: 101 mg/dL — ABNORMAL HIGH (ref 70–99)

## 2024-08-24 LAB — MAGNESIUM: Magnesium: 1.8 mg/dL (ref 1.7–2.4)

## 2024-08-24 MED ORDER — SODIUM CHLORIDE 0.9 % IV SOLN: INTRAVENOUS | Status: DC

## 2024-08-24 MED ORDER — OXYCODONE-ACETAMINOPHEN 5-325 MG PO TABS
1.0000 | ORAL_TABLET | ORAL | Status: DC | PRN
  Administered 2024-08-24 – 2024-08-26 (×10): 2 via ORAL
  Filled 2024-08-24 (×10): qty 2

## 2024-08-24 NOTE — Progress Notes (Signed)
 [facility]                                                                               Stacie Knutzen, is a 78 y.o. male, DOB - 1946-09-30, FMW:[id number] Admit date - 08/18/2024    Outpatient Primary MD for the patient is Lucius Krabbe, NP  LOS - 5  days    Brief summary     Gary Wright is a 78 y.o. 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  According to patient he has been having left hip pain for some time now and follows up with orthopedics as an outpatient.  According to patient he has not had any recent fall, last fall was about 3 months ago.  He used to walk with a walker however he is barely able to walk with a walker at this time as his left hip pain has become more severe within the last few days.  He rates it as 10/10 located in the left hip, partially relieved by pain medication administered in the emergency room.   MRI of the hip with and without contrast done showing  Osteolysis of the left femoral head with flattening of the left femoral head and heterogeneous marrow signal throughout the left femoral neck and intertrochanteric region. 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 No acute fracture identified. Moderate-sized left hip joint effusion with nonspecific synovial thickening. If concern for infection, consider joint aspiration.  Moderate right hip degenerative changes.  Patient underwent arthrocentesis on the left and fluid cultures are negative.  He is scheduled for THA on Friday.  Assessment & Plan    Assessment and Plan:   Acute on chronic left hip pain  Showing osteonecrosis of the left hip   Admitted for pain  control currently requiring IV dilaudid and percocet pain.   MRI of the left hip showing  Osteolysis of the left femoral head with flattening of the left femoral head and heterogeneous marrow signal throughout the left femoral neck and intertrochanteric region. 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 No acute fracture identified. Moderate-sized left hip joint effusion with nonspecific synovial thickening. IR consulted for joint aspiration. Joint aspiration done on 10/30, cultures are negative so far.  Orthopedics consulted , plan for THA on Friday. Therapy eval recommeding SNF.  Working with PT.   COPD No wheezing heard. Continue with Breztri and duonebs as needed.    Hypertension Optimal.   Mild AKI Possibly from dehydration. Start him gentle IV fluids. Repeat renal parameters in am.    Mild leukocytosis  Resolved.    Chronic diastolic CHF He appears compensated. Hold lasix  for AKI.   Constipation; Resolved. Had  a BM.    Estimated body mass index is 27.52 kg/m as calculated from the following:   Height as of this encounter: 5' 8 (1.727 m).   Weight as of this encounter: 82.1 kg.  Code Status: full  code.  DVT Prophylaxis:  heparin  injection 5,000 Units Start: 08/18/24 2200   Level of Care: Level of care: Telemetry Family Communication: none at bedside.   Disposition Plan:     Remains inpatient appropriate:  THA scheduled on Friday.   Procedures:  MRI of the left hip.   Consultants:   Orthopedics.   Antimicrobials:   Anti-infectives (From admission, onward)    None        Medications  Scheduled Meds:  atorvastatin   40 mg Oral QHS   budesonide -glycopyrrolate-formoterol  2 puff Inhalation BID   buPROPion   300 mg Oral Daily   citalopram   40 mg Oral Daily   furosemide   20 mg Oral Daily   heparin   5,000 Units Subcutaneous Q8H   insulin  aspart  0-9 Units Subcutaneous TID WC   irbesartan  75 mg  Oral Daily   metoprolol  succinate  12.5 mg Oral Daily   polyethylene glycol  17 g Oral Daily   senna-docusate  2 tablet Oral BID   Continuous Infusions: PRN Meds:.cyclobenzaprine , HYDROcodone-acetaminophen , HYDROmorphone (DILAUDID) injection, ipratropium-albuterol , pantoprazole     Subjective:   Gary Wright was seen and examined Patient requesting to change pain meds.   Objective:   Vitals:   08/23/24 1722 08/23/24 2005 08/24/24 0404 08/24/24 1022  BP: 109/62 119/74 121/72 133/76  Pulse: 83 84 95 100  Resp: 18   18  Temp: 98 F (36.7 C) 98.3 F (36.8 C) 98.6 F (37 C) 97.7 F (36.5 C)  TempSrc: Oral   Oral  SpO2: 96% 94% 94% 95%  Weight:      Height:        Intake/Output Summary (Last 24 hours) at 08/24/2024 1353 Last data filed at 08/24/2024 1000 Gross per 24 hour  Intake --  Output 820 ml  Net -820 ml   Filed Weights   08/18/24 1331  Weight: 82.1 kg     Exam General exam: Appears calm and comfortable  Respiratory system: Clear to auscultation. Respiratory effort normal. Cardiovascular system: S1 & S2 heard, RRR.  Gastrointestinal system: Abdomen is nondistended, soft and nontender.  Central nervous system: Alert and oriented.  Extremities: Symmetric 5 x 5 power. Skin: No rashes,  Psychiatry:  Mood & affect appropriate.     Data Reviewed:  I have personally reviewed following labs and imaging studies   CBC Lab Results  Component Value Date   WBC 8.8 08/24/2024   RBC 3.98 (L) 08/24/2024   HGB 12.5 (L) 08/24/2024   HCT 38.2 (L) 08/24/2024   MCV 96.0 08/24/2024   MCH 31.4 08/24/2024   PLT 236 08/24/2024   MCHC 32.7 08/24/2024   RDW 13.5 08/24/2024   LYMPHSABS 1.4 08/24/2024   MONOABS 0.9 08/24/2024   EOSABS 0.2 08/24/2024   BASOSABS 0.1 08/24/2024     Last metabolic panel Lab Results  Component Value Date   NA 133 (L) 08/24/2024   K 4.2 08/24/2024   CL 97 (L) 08/24/2024   CO2 25 08/24/2024   BUN 23 08/24/2024   CREATININE 1.26 (H)  08/24/2024   GLUCOSE 117 (H) 08/24/2024   GFRNONAA 58 (L) 08/24/2024   GFRAA 57 (L) 12/14/2019   CALCIUM  8.8 (L) 08/24/2024   PHOS 3.5 03/27/2022   PROT 6.0 (L) 08/15/2024   ALBUMIN 3.4 (L) 08/15/2024   LABGLOB 1.8 01/18/2021   AGRATIO 2.6 (H) 01/18/2021   BILITOT 0.7 08/15/2024   ALKPHOS 78 08/15/2024   AST 18 08/15/2024   ALT 27 08/15/2024   ANIONGAP 11 08/24/2024  CBG (last 3)  Recent Labs    08/24/24 0615 08/24/24 0826 08/24/24 1118  GLUCAP 117* 159* 142*      Coagulation Profile: No results for input(s): INR, PROTIME in the last 168 hours.   Radiology Studies: No results found.      Elgie Butter M.D. [facility] 08/24/2024, 1:53 PM  Available via Epic secure chat 7am-7pm After 7 pm, please refer to night coverage provider listed on amion.

## 2024-08-24 NOTE — Plan of Care (Signed)
   Problem: Activity: Goal: Risk for activity intolerance will decrease Outcome: Progressing

## 2024-08-24 NOTE — Progress Notes (Signed)
 OT Cancellation Note  Patient Details Name: Gary Wright MRN: [id number] DOB: [birth year]   Cancelled Treatment:    Reason Eval/Treat Not Completed: Patient declined, no reason specified (Pt declined stating he sat in the recliner this morning and is in a lot of pain now)  Lucie JONETTA Kendall 08/24/2024, 11:23 AM

## 2024-08-24 NOTE — Progress Notes (Signed)
 Mobility Specialist Progress Note:    08/24/24 1010  Mobility  Activity Pivoted/transferred from bed to chair  Level of Assistance Minimal assist, patient does 75% or more (+2)  Assistive Device Other (Comment) (HHA)  Distance Ambulated (ft) 3 ft  Activity Response Tolerated well  Mobility Referral Yes  Mobility visit 1 Mobility  Mobility Specialist Start Time (ACUTE ONLY) 1000  Mobility Specialist Stop Time (ACUTE ONLY) 1010  Mobility Specialist Time Calculation (min) (ACUTE ONLY) 10 min   Received pt in bed and eager for mobility. Pt required MinA +2 to laterally scoot to chair d/t LLE pain. Pt left in chair with alarm on. Personal belongings and call light within reach. All needs met.  Lavanda Pollack Mobility Specialist  Please contact via [employer] or  Rehab Office [phone]

## 2024-08-25 ENCOUNTER — Ambulatory Visit: Admitting: Nurse Practitioner

## 2024-08-25 DIAGNOSIS — M87052 Idiopathic aseptic necrosis of left femur: Secondary | ICD-10-CM | POA: Diagnosis not present

## 2024-08-25 LAB — BASIC METABOLIC PANEL WITH GFR
Anion gap: 6 (ref 5–15)
BUN: 15 mg/dL (ref 8–23)
CO2: 25 mmol/L (ref 22–32)
Calcium: 6.9 mg/dL — ABNORMAL LOW (ref 8.9–10.3)
Chloride: 107 mmol/L (ref 98–111)
Creatinine, Ser: 0.92 mg/dL (ref 0.61–1.24)
GFR, Estimated: >60 mL/min (ref >60)
Glucose, Bld: 148 mg/dL — ABNORMAL HIGH (ref 70–99)
Potassium: 3.3 mmol/L — ABNORMAL LOW (ref 3.5–5.1)
Sodium: 138 mmol/L (ref 135–145)

## 2024-08-25 LAB — GLUCOSE, CAPILLARY
Glucose-Capillary: 98 mg/dL (ref 70–99)
Glucose-Capillary: 101 mg/dL — ABNORMAL HIGH (ref 70–99)
Glucose-Capillary: 115 mg/dL — ABNORMAL HIGH (ref 70–99)
Glucose-Capillary: 108 mg/dL — ABNORMAL HIGH (ref 70–99)

## 2024-08-25 NOTE — TOC Progression Note (Signed)
 Transition of Care [facility]) - Progression Note    Patient Details  Name: Gary Wright MRN: [id number] Date of Birth: Dec 01, 1945  [facility] of [facility] CM/SW Contact  Bridget Cordella Simmonds, LCSW Phone Number: 08/25/2024, 9:50 AM  Clinical Narrative:   ICM continues to follow.    Expected Discharge Plan: Skilled Nursing Facility Barriers to Discharge: Continued Medical Work up, SNF Pending bed offer               Expected Discharge Plan and Services                                               Social Drivers of Health (SDOH) Interventions SDOH Screenings   Food Insecurity: No Food Insecurity (08/19/2024)  Housing: Low Risk  (08/19/2024)  Transportation Needs: No Transportation Needs (08/19/2024)  Utilities: Not At Risk (08/19/2024)  Alcohol Screen: Low Risk  (11/18/2023)  Depression (PHQ2-9): Medium Risk (11/18/2023)  Financial Resource Strain: Low Risk  (11/18/2023)  Physical Activity: Insufficiently Active (11/18/2023)  Social Connections: Moderately Isolated (08/19/2024)  Stress: No Stress Concern Present (11/18/2023)  Tobacco Use: Medium Risk (08/18/2024)  Health Literacy: Adequate Health Literacy (11/18/2023)    Readmission Risk Interventions     No data to display

## 2024-08-25 NOTE — Hospital Course (Addendum)
 Gary Wright is a 78 y.o. male with a history of COPD, asthma, CAD, hyperlipidemia, hypertension, stress-induced cardiomyopathy, chronic avascular necrosis of left hip.  Patient presented secondary to worsening left hip pain in setting of known vascular necrosis.  MRI findings significant for findings most consistent with rapidly progressive femoral head lysis osteonecrosis.  Orthopedic surgery was consulted and performed a total hip arthroplasty on 11/7.

## 2024-08-25 NOTE — Progress Notes (Signed)
 Physical Therapy Treatment Patient Details Name: Gary Wright MRN: [id number] DOB: 03/21/1946 Today's Date: 08/25/2024   History of Present Illness 78 y.o. male presents to [facility] 08/18/24 with severe L hip pain. 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 Also with L hip joint effusion w/ plan for joint aspiration. Recent prior admits due to SOB with hypoxia. PMH positive for stage IV COPD, CAD s/p CABG, HTN, HLD, DM.    PT Comments  Pt resting in bed on arrival, agreeable to session and demonstrating slow but steady progress towards acute goals as pt continues to be limited by L hip pain, limiting weight bearing and standing tolerance. Pt requiring grossly min A to complete bed mobility, transfers sit<>stand and short in room gait with RW for support. Surgical hip HEP issued to pt in anticipation of THA 11/7 with pt demonstrating and verbalizing understanding of all exercises. Pt up in chair at end of session with all needs met. Pt continues to benefit from skilled PT services to progress toward functional mobility goals.     If plan is discharge home, recommend the following: A lot of help with walking and/or transfers;A lot of help with bathing/dressing/bathroom;Assistance with cooking/housework;Assist for transportation;Help with stairs or ramp for entrance   Can travel by private vehicle     No  Equipment Recommendations  Wheelchair (measurements PT);Wheelchair cushion (measurements PT);BSC/3in1    Recommendations for Other Services       Precautions / Restrictions Precautions Precautions: Fall Recall of Precautions/Restrictions: Intact Precaution/Restrictions Comments: watch O2 Restrictions Weight Bearing Restrictions Per Provider Order: No     Mobility  Bed Mobility Overal bed mobility: Needs Assistance Bed Mobility: Supine to Sit     Supine to sit: HOB elevated, Used rails, Min assist     General  bed mobility comments: pt utilizing gait belt as leg lifter, with increased time able to self mobilize to L EOB without assist with HOB elevated significantly, min A to gain balance at EOB due to R lateral lean from L hip pain    Transfers Overall transfer level: Needs assistance Equipment used: Rolling walker (2 wheels) Transfers: Sit to/from Stand Sit to Stand: Min assist           General transfer comment: min A to boost to stand from EOB at lowest height, cues for hand placement with pt able to correct this session    Ambulation/Gait Ambulation/Gait assistance: Min assist Gait Distance (Feet): 6 Feet Assistive device: Rolling walker (2 wheels) Gait Pattern/deviations: Decreased stride length, Trunk flexed, Step-to pattern, Decreased stance time - left, Decreased weight shift to left Gait velocity: reduced     General Gait Details: Pt taking short antalgic steps with step to pattern and decreased stance on L due to pain. Cues for increased UE use to offload LEs   Stairs             Wheelchair Mobility     Tilt Bed    Modified Rankin (Stroke Patients Only)       Balance Overall balance assessment: Needs assistance, History of Falls Sitting-balance support: Feet supported, Bilateral upper extremity supported Sitting balance-Leahy Scale: Fair Sitting balance - Comments: reliant on B UE support and support to prop L LE in extension   Standing balance support: Bilateral upper extremity supported, During functional activity Standing balance-Leahy Scale: Poor Standing balance comment: reliant on RW support  Communication Communication Communication: No apparent difficulties  Cognition Arousal: Alert Behavior During Therapy: WFL for tasks assessed/performed   PT - Cognitive impairments: No apparent impairments                         Following commands: Intact      Cueing Cueing Techniques: Verbal cues   Exercises Total Joint Exercises Ankle Circles/Pumps: AROM, Both, 10 reps, Seated (encouraged to complete x20/hr) Quad Sets:  (verbally reviewed and demonstrated) Heel Slides:  (verbally reviewed and demonstrated) Long Arc Quad: AROM, Left, 5 reps, Seated Other Exercises Other Exercises: Surgical hip HEP provided and reviewed in anticipation of upcoming THA planned for 11/7    General Comments General comments (skin integrity, edema, etc.): VSS on RA      Pertinent Vitals/Pain Pain Assessment Pain Assessment: Faces Faces Pain Scale: Hurts even more Pain Location: L hip Pain Descriptors / Indicators: Aching, Grimacing, Discomfort Pain Intervention(s): Monitored during session, Limited activity within patient's tolerance, Repositioned    Home Living                          Prior Function            PT Goals (current goals can now be found in the care plan section) Acute Rehab PT Goals Patient Stated Goal: to have less pain when moving PT Goal Formulation: With patient Time For Goal Achievement: 09/02/24 Progress towards PT goals: Progressing toward goals    Frequency    Min 2X/week      PT Plan      Co-evaluation              AM-PAC PT 6 Clicks Mobility   Outcome Measure  Help needed turning from your back to your side while in a flat bed without using bedrails?: A Little Help needed moving from lying on your back to sitting on the side of a flat bed without using bedrails?: A Little Help needed moving to and from a bed to a chair (including a wheelchair)?: A Little Help needed standing up from a chair using your arms (e.g., wheelchair or bedside chair)?: A Little Help needed to walk in hospital room?: Total (<20') Help needed climbing 3-5 steps with a railing? : Total 6 Click Score: 14    End of Session   Activity Tolerance: Patient limited by pain;Patient tolerated treatment well Patient left: with call bell/phone within reach;in  chair;with chair alarm set Nurse Communication: Mobility status PT Visit Diagnosis: Other abnormalities of gait and mobility (R26.89);History of falling (Z91.81);Pain Pain - Right/Left: Left Pain - part of body: Hip     Time: 8981-8964 PT Time Calculation (min) (ACUTE ONLY): 17 min  Charges:    $Therapeutic Activity: 8-22 mins PT General Charges $$ ACUTE PT VISIT: 1 Visit                     Taniya Dasher R. PTA Acute Rehabilitation Services Office: [phone]   Therisa CHRISTELLA Boor 08/25/2024, 10:42 AM

## 2024-08-25 NOTE — Progress Notes (Signed)
 Occupational Therapy Treatment Patient Details Name: Gary Wright MRN: [id number] DOB: [birth year] Today's Date: 08/25/2024   History of present illness 78 y.o. male presents to [facility] 08/18/24 with severe L hip pain. 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 Also with L hip joint effusion w/ plan for joint aspiration. Recent prior admits due to SOB with hypoxia. PMH positive for stage IV COPD, CAD s/p CABG, HTN, HLD, DM.   OT comments  Pt completed seated grooming, UB bathing and dressing with set up in recliner. Max assist needed to wash and dress feet. Pt stood with heavy min assist from recliner and ambulated with min assist and RW. Returned to supine with min assist for LEs. Pt is looking forward to impending hip replacement on Friday. Patient will benefit from continued inpatient follow up therapy, <3 hours/day.      If plan is discharge home, recommend the following:  A lot of help with bathing/dressing/bathroom;Assistance with cooking/housework;Assist for transportation;Help with stairs or ramp for entrance;A little help with walking and/or transfers   Equipment Recommendations  Other (comment) (defer to next venue)    Recommendations for Other Services      Precautions / Restrictions Precautions Precautions: Fall Recall of Precautions/Restrictions: Intact Precaution/Restrictions Comments: watch O2 Restrictions Weight Bearing Restrictions Per Provider Order: No       Mobility Bed Mobility Overal bed mobility: Needs Assistance Bed Mobility: Sit to Supine       Sit to supine: Min assist   General bed mobility comments: assist for LEs back into bed    Transfers Overall transfer level: Needs assistance Equipment used: Rolling walker (2 wheels) Transfers: Sit to/from Stand Sit to Stand: Min assist           General transfer comment: heavy min assist to stand from recliner     Balance  Overall balance assessment: Needs assistance, History of Falls   Sitting balance-Leahy Scale: Good     Standing balance support: Bilateral upper extremity supported, During functional activity Standing balance-Leahy Scale: Poor                             ADL either performed or assessed with clinical judgement   ADL Overall ADL's : Needs assistance/impaired     Grooming: Wash/dry hands;Wash/dry face;Brushing hair;Sitting;Set up   Upper Body Bathing: Set up;Sitting Upper Body Bathing Details (indicate cue type and reason): applied lotion to pt's back Lower Body Bathing: Maximal assistance;Sitting/lateral leans Lower Body Bathing Details (indicate cue type and reason): assisted with feet Upper Body Dressing : Set up;Sitting   Lower Body Dressing: Total assistance;Sitting/lateral leans Lower Body Dressing Details (indicate cue type and reason): socks             Functional mobility during ADLs: Minimal assistance;Rolling walker (2 wheels)      Extremity/Trunk Assessment              Vision       Perception     Praxis     Communication Communication Communication: No apparent difficulties   Cognition Arousal: Alert Behavior During Therapy: WFL for tasks assessed/performed Cognition: No apparent impairments                               Following commands: Intact        Cueing   Cueing Techniques: Verbal cues  Exercises      Shoulder Instructions       General Comments      Pertinent Vitals/ Pain       Pain Assessment Pain Assessment: Faces Faces Pain Scale: Hurts even more Pain Location: L hip Pain Descriptors / Indicators: Aching, Grimacing, Discomfort Pain Intervention(s): Monitored during session, Repositioned  Home Living                                          Prior Functioning/Environment              Frequency  Min 2X/week        Progress Toward Goals  OT Goals(current  goals can now be found in the care plan section)  Progress towards OT goals: Progressing toward goals  Acute Rehab OT Goals OT Goal Formulation: With patient Time For Goal Achievement: 09/02/24 Potential to Achieve Goals: Good  Plan      Co-evaluation                 AM-PAC OT 6 Clicks Daily Activity     Outcome Measure   Help from another person eating meals?: None Help from another person taking care of personal grooming?: A Little Help from another person toileting, which includes using toliet, bedpan, or urinal?: A Lot Help from another person bathing (including washing, rinsing, drying)?: A Lot Help from another person to put on and taking off regular upper body clothing?: A Little Help from another person to put on and taking off regular lower body clothing?: A Lot 6 Click Score: 16    End of Session Equipment Utilized During Treatment: Gait belt;Rolling walker (2 wheels)  OT Visit Diagnosis: Pain;History of falling (Z91.81)   Activity Tolerance Patient limited by fatigue   Patient Left in bed;with call bell/phone within reach;with bed alarm set   Nurse Communication          Time: 1410-1439 OT Time Calculation (min): 29 min  Charges: OT General Charges $OT Visit: 1 Visit OT Treatments $Self Care/Home Management : 23-37 mins  Mliss HERO, OTR/L Acute Rehabilitation Services Office: [phone]   Kennth Mliss Helling 08/25/2024, 2:48 PM

## 2024-08-25 NOTE — Progress Notes (Signed)
 PROGRESS NOTE    Esaw Knippel  FMW:[id number] DOB: 05-07-1946 DOA: 08/18/2024 PCP: Lucius Krabbe, NP   Brief Narrative: Gary Wright is a 78 y.o. male with a history of COPD, asthma, CAD, hyperlipidemia, hypertension, stress-induced cardiomyopathy, chronic avascular necrosis of left hip.  Patient presented secondary to worsening left hip pain in setting of known vascular necrosis.  MRI findings significant for findings most consistent with rapidly progressive femoral head lysis osteonecrosis.  Orthopedic surgery was consulted and recommended total hip arthroplasty which is planned for 11/7.   Assessment and Plan:  Acute on chronic left hip pain Osteonecrosis of the left hip Orthopedic surgery was consulted and plan for total hip arthroplasty on 11/7. - Continue analgesics for pain management  COPD Stable.  No wheezing. - Continue Breztri and DuoNebs  Primary pretension - Continue irbesartan and metoprolol  succinate  Elevated creatinine Mild.  Baseline creatinine of about 1. Creatinine peak of 1.26, not meeting criteria for AKI.  Leukocytosis Mild.  Resolved.  Chronic diastolic heart failure Stable.  Lasix  held secondary to AKI on admission.  Constipation Resolved.   DVT prophylaxis: Subcutaneous heparin  Code Status:   Code Status: Full Code Family Communication: None at bedside Disposition Plan: Discharge pending orthopedic surgery recommendations/management   Consultants:  Orthopedic surgery  Procedures:  None  Antimicrobials: None    Subjective: Patient reports ongoing pain. Looking forward to this Friday.  Objective: BP (!) 145/86 (BP Location: Right Arm)   Pulse 82   Temp 97.8 F (36.6 C) (Oral)   Resp 17   Ht 5' 8 (1.727 m)   Wt 82.1 kg   SpO2 96%   BMI 27.52 kg/m   Examination:  General exam: Appears calm and comfortable. Respiratory system: Clear to auscultation. Respiratory effort normal. Cardiovascular system:  S1 & S2 heard, RRR. no murmur. Gastrointestinal system: Abdomen is nondistended, soft and nontender. Normal bowel sounds heard. Central nervous system: Alert and oriented. No focal neurological deficits. Musculoskeletal: No edema. No calf tenderness Skin: No cyanosis. No rashes Psychiatry: Judgement and insight appear normal. Mood & affect appropriate.    Data Reviewed: I have personally reviewed following labs and imaging studies  CBC Lab Results  Component Value Date   WBC 8.8 08/24/2024   RBC 3.98 (L) 08/24/2024   HGB 12.5 (L) 08/24/2024   HCT 38.2 (L) 08/24/2024   MCV 96.0 08/24/2024   MCH 31.4 08/24/2024   PLT 236 08/24/2024   MCHC 32.7 08/24/2024   RDW 13.5 08/24/2024   LYMPHSABS 1.4 08/24/2024   MONOABS 0.9 08/24/2024   EOSABS 0.2 08/24/2024   BASOSABS 0.1 08/24/2024     Last metabolic panel Lab Results  Component Value Date   NA 138 08/25/2024   K 3.3 (L) 08/25/2024   CL 107 08/25/2024   CO2 25 08/25/2024   BUN 15 08/25/2024   CREATININE 0.92 08/25/2024   GLUCOSE 148 (H) 08/25/2024   GFRNONAA >60 08/25/2024   GFRAA 57 (L) 12/14/2019   CALCIUM  6.9 (L) 08/25/2024   PHOS 3.5 03/27/2022   PROT 6.0 (L) 08/15/2024   ALBUMIN 3.4 (L) 08/15/2024   LABGLOB 1.8 01/18/2021   AGRATIO 2.6 (H) 01/18/2021   BILITOT 0.7 08/15/2024   ALKPHOS 78 08/15/2024   AST 18 08/15/2024   ALT 27 08/15/2024   ANIONGAP 6 08/25/2024    GFR: Estimated Creatinine Clearance: 69.2 mL/min (by C-G formula based on SCr of 0.92 mg/dL).  Recent Results (from the past 240 hours)  Body fluid culture w Gram  Stain     Status: None   Collection Time: 08/19/24 10:21 AM   Specimen: Body Fluid  Result Value Ref Range Status   Specimen Description FLUID HIP  Final   Special Requests Normal  Final   Gram Stain NO WBC SEEN NO ORGANISMS SEEN   Final   Culture   Final    NO GROWTH 3 DAYS Performed at [facility], [address], [place], [place] [address]    Report Status 08/22/2024  FINAL  Final      Radiology Studies: No results found.    LOS: 6 days    Elgin Lam, MD [facility] 08/25/2024, 1:29 PM   If 7PM-7AM, please contact night-coverage [email or web address]

## 2024-08-26 ENCOUNTER — Ambulatory Visit (HOSPITAL_COMMUNITY)

## 2024-08-26 DIAGNOSIS — M87052 Idiopathic aseptic necrosis of left femur: Secondary | ICD-10-CM | POA: Diagnosis not present

## 2024-08-26 LAB — GLUCOSE, CAPILLARY
Glucose-Capillary: 88 mg/dL (ref 70–99)
Glucose-Capillary: 93 mg/dL (ref 70–99)
Glucose-Capillary: 49 mg/dL — ABNORMAL LOW (ref 70–99)
Glucose-Capillary: 81 mg/dL (ref 70–99)
Glucose-Capillary: 132 mg/dL — ABNORMAL HIGH (ref 70–99)

## 2024-08-26 LAB — CBC
HCT: 36.9 % — ABNORMAL LOW (ref 39.0–52.0)
Hemoglobin: 12 g/dL — ABNORMAL LOW (ref 13.0–17.0)
MCH: 31.7 pg (ref 26.0–34.0)
MCHC: 32.5 g/dL (ref 30.0–36.0)
MCV: 97.6 fL (ref 80.0–100.0)
Platelets: 216 K/uL (ref 150–400)
RBC: 3.78 MIL/uL — ABNORMAL LOW (ref 4.22–5.81)
RDW: 13.3 % (ref 11.5–15.5)
WBC: 7.9 K/uL (ref 4.0–10.5)
nRBC: 0 % (ref 0.0–0.2)

## 2024-08-26 LAB — BASIC METABOLIC PANEL WITH GFR
Anion gap: 12 (ref 5–15)
BUN: 18 mg/dL (ref 8–23)
CO2: 26 mmol/L (ref 22–32)
Calcium: 8.6 mg/dL — ABNORMAL LOW (ref 8.9–10.3)
Chloride: 100 mmol/L (ref 98–111)
Creatinine, Ser: 1.2 mg/dL (ref 0.61–1.24)
GFR, Estimated: >60 mL/min (ref >60)
Glucose, Bld: 103 mg/dL — ABNORMAL HIGH (ref 70–99)
Potassium: 4.2 mmol/L (ref 3.5–5.1)
Sodium: 138 mmol/L (ref 135–145)

## 2024-08-26 LAB — TYPE AND SCREEN
ABO/RH(D): O POS
Antibody Screen: NEGATIVE

## 2024-08-26 LAB — SURGICAL PCR SCREEN
MRSA, PCR: NEGATIVE
Staphylococcus aureus: NEGATIVE

## 2024-08-26 MED ORDER — SODIUM CHLORIDE 0.9 % IV SOLN: INTRAVENOUS | Status: AC

## 2024-08-26 MED ORDER — TRANEXAMIC ACID 1000 MG/10ML IV SOLN
2000.0000 mg | INTRAVENOUS | Status: DC
  Filled 2024-08-26: qty 20

## 2024-08-26 NOTE — Anesthesia Preprocedure Evaluation (Signed)
 Anesthesia Evaluation  Patient identified by MRN, date of birth, ID band Patient awake    Reviewed: Allergy & Precautions, NPO status , Patient's Chart, lab work & pertinent test results  History of Anesthesia Complications Negative for: history of anesthetic complications  Airway Mallampati: III  TM Distance: >3 FB Neck ROM: Full   Comment: Previous grade I view with MAC 4, easy mask Dental  (+) Edentulous Upper, Missing, Dental Advisory Given Small broken pieces of teeth on the bottom. Patient will be getting them pulled.:   Pulmonary neg shortness of breath, asthma , neg sleep apnea, COPD (flare 4-5 weeks ago, resolved, used inhaler this morning),  COPD inhaler, neg recent URI, former smoker, neg PE H/o lung cancer 2022 s/p surgery   Pulmonary exam normal breath sounds clear to auscultation       Cardiovascular hypertension (metoprolol , olmesartan ), Pt. on home beta blockers and Pt. on medications (-) angina + CAD  (-) Past MI, (-) Cardiac Stents and (-) CABG + dysrhythmias (prolonged QT, RBBB)  Rhythm:Regular Rate:Normal  HLD  TTE 06/13/2024: IMPRESSIONS    1. Left ventricular ejection fraction, by estimation, is 60 to 65%. The  left ventricle has normal function. The left ventricle has no regional  wall motion abnormalities. Left ventricular diastolic parameters are  consistent with Grade I diastolic  dysfunction (impaired relaxation).   2. Right ventricular systolic function is normal. The right ventricular  size is normal. There is normal pulmonary artery systolic pressure.   3. The mitral valve is normal in structure. No evidence of mitral valve  regurgitation. No evidence of mitral stenosis.   4. The tricuspid valve is abnormal.   5. The aortic valve is tricuspid. There is mild calcification of the  aortic valve. There is mild thickening of the aortic valve. Aortic valve  regurgitation is not visualized. No aortic  stenosis is present.   6. The inferior vena cava is normal in size with greater than 50%  respiratory variability, suggesting right atrial pressure of 3 mmHg.   Stress Test 05/03/2022:   Findings are consistent with prior myocardial infarction. The study is intermediate risk.   No ST deviation was noted.   Nuclear stress EF: 57 %. The left ventricular ejection fraction is normal (55-65%). End diastolic cavity size is normal.   Prior study not available for comparison.     Neuro/Psych  PSYCHIATRIC DISORDERS  Depression    negative neurological ROS     GI/Hepatic Neg liver ROS,GERD  Medicated,,  Endo/Other  diabetes, Type 2, Oral Hypoglycemic Agents    Renal/GU CRFRenal disease     Musculoskeletal   Abdominal   Peds  Hematology negative hematology ROS (+)   Anesthesia Other Findings   Reproductive/Obstetrics                              Anesthesia Physical Anesthesia Plan  ASA: 3  Anesthesia Plan: General   Post-op Pain Management: Tylenol  PO (pre-op)*   Induction: Intravenous  PONV Risk Score and Plan: 2 and Ondansetron , Dexamethasone  and Treatment may vary due to age or medical condition  Airway Management Planned: Oral ETT  Additional Equipment:   Intra-op Plan:   Post-operative Plan: Extubation in OR  Informed Consent: I have reviewed the patients History and Physical, chart, labs and discussed the procedure including the risks, benefits and alternatives for the proposed anesthesia with the patient or authorized representative who has indicated his/her understanding and acceptance.  Dental advisory given  Plan Discussed with: CRNA and Anesthesiologist  Anesthesia Plan Comments: (Risks of general anesthesia discussed including, but not limited to, sore throat, hoarse voice, chipped/damaged teeth, injury to vocal cords, nausea and vomiting, allergic reactions, lung infection, heart attack, stroke, and death. All questions  answered. )         Anesthesia Quick Evaluation

## 2024-08-26 NOTE — Progress Notes (Signed)
 PROGRESS NOTE    Gary Wright  FMW:[id number] DOB: 1945/12/28 DOA: 08/18/2024 PCP: Lucius Krabbe, NP   Brief Narrative: Gary Wright is a 78 y.o. male with a history of COPD, asthma, CAD, hyperlipidemia, hypertension, stress-induced cardiomyopathy, chronic avascular necrosis of left hip.  Patient presented secondary to worsening left hip pain in setting of known vascular necrosis.  MRI findings significant for findings most consistent with rapidly progressive femoral head lysis osteonecrosis.  Orthopedic surgery was consulted and recommended total hip arthroplasty which is planned for 11/7.   Assessment and Plan:  Acute on chronic left hip pain Osteonecrosis of the left hip Orthopedic surgery was consulted and plan for total hip arthroplasty on 11/7. - Continue analgesics for pain management  COPD Stable.  No wheezing. - Continue Breztri and DuoNebs  Primary pretension - Continue irbesartan and metoprolol  succinate  Elevated creatinine Mild.  Baseline creatinine of about 1. Creatinine peak of 1.26, not meeting criteria for AKI.  Leukocytosis Mild.  Resolved.  Chronic diastolic heart failure Stable.  Lasix  held secondary to AKI on admission.  Constipation Resolved.   DVT prophylaxis: Subcutaneous heparin  Code Status:   Code Status: Full Code Family Communication: None at bedside Disposition Plan: Discharge pending orthopedic surgery recommendations/management   Consultants:  Orthopedic surgery  Procedures:  None  Antimicrobials: None    Subjective: No issues noted from overnight events.  Objective: BP (!) 141/76 (BP Location: Left Arm)   Pulse 94   Temp 98.2 F (36.8 C) (Oral)   Resp 17   Ht 5' 8 (1.727 m)   Wt 82.1 kg   SpO2 93%   BMI 27.52 kg/m   Examination:  General exam: Appears calm and comfortable.   Data Reviewed: I have personally reviewed following labs and imaging studies  CBC Lab Results  Component  Value Date   WBC 7.9 08/26/2024   RBC 3.78 (L) 08/26/2024   HGB 12.0 (L) 08/26/2024   HCT 36.9 (L) 08/26/2024   MCV 97.6 08/26/2024   MCH 31.7 08/26/2024   PLT 216 08/26/2024   MCHC 32.5 08/26/2024   RDW 13.3 08/26/2024   LYMPHSABS 1.4 08/24/2024   MONOABS 0.9 08/24/2024   EOSABS 0.2 08/24/2024   BASOSABS 0.1 08/24/2024     Last metabolic panel Lab Results  Component Value Date   NA 138 08/26/2024   K 4.2 08/26/2024   CL 100 08/26/2024   CO2 26 08/26/2024   BUN 18 08/26/2024   CREATININE 1.20 08/26/2024   GLUCOSE 103 (H) 08/26/2024   GFRNONAA >60 08/26/2024   GFRAA 57 (L) 12/14/2019   CALCIUM  8.6 (L) 08/26/2024   PHOS 3.5 03/27/2022   PROT 6.0 (L) 08/15/2024   ALBUMIN 3.4 (L) 08/15/2024   LABGLOB 1.8 01/18/2021   AGRATIO 2.6 (H) 01/18/2021   BILITOT 0.7 08/15/2024   ALKPHOS 78 08/15/2024   AST 18 08/15/2024   ALT 27 08/15/2024   ANIONGAP 12 08/26/2024    GFR: Estimated Creatinine Clearance: 53 mL/min (by C-G formula based on SCr of 1.2 mg/dL).  Recent Results (from the past 240 hours)  Body fluid culture w Gram Stain     Status: None   Collection Time: 08/19/24 10:21 AM   Specimen: Body Fluid  Result Value Ref Range Status   Specimen Description FLUID HIP  Final   Special Requests Normal  Final   Gram Stain NO WBC SEEN NO ORGANISMS SEEN   Final   Culture   Final    NO GROWTH 3  DAYS Performed at [facility], [address], [place], [place] [address]    Report Status 08/22/2024 FINAL  Final      Radiology Studies: No results found.    LOS: 7 days    Elgin Lam, MD [facility] 08/26/2024, 11:25 AM   If 7PM-7AM, please contact night-coverage [email or web address]

## 2024-08-27 ENCOUNTER — Inpatient Hospital Stay (HOSPITAL_COMMUNITY)

## 2024-08-27 ENCOUNTER — Encounter (HOSPITAL_COMMUNITY): Payer: Self-pay | Admitting: Internal Medicine

## 2024-08-27 ENCOUNTER — Inpatient Hospital Stay (HOSPITAL_COMMUNITY): Admitting: Anesthesiology

## 2024-08-27 ENCOUNTER — Encounter (HOSPITAL_COMMUNITY)
Admission: EM | Disposition: A | Discharge status: Skilled Nursing Facility | Payer: Self-pay | Source: Home / Self Care | Attending: Internal Medicine

## 2024-08-27 DIAGNOSIS — M87852 Other osteonecrosis, left femur: Secondary | ICD-10-CM

## 2024-08-27 DIAGNOSIS — I2510 Atherosclerotic heart disease of native coronary artery without angina pectoris: Secondary | ICD-10-CM

## 2024-08-27 DIAGNOSIS — Z87891 Personal history of nicotine dependence: Secondary | ICD-10-CM

## 2024-08-27 DIAGNOSIS — I10 Essential (primary) hypertension: Secondary | ICD-10-CM

## 2024-08-27 DIAGNOSIS — M87052 Idiopathic aseptic necrosis of left femur: Secondary | ICD-10-CM | POA: Diagnosis not present

## 2024-08-27 HISTORY — PX: TOTAL HIP ARTHROPLASTY: SHX124

## 2024-08-27 LAB — BASIC METABOLIC PANEL WITH GFR
Anion gap: 10 (ref 5–15)
BUN: 11 mg/dL (ref 8–23)
CO2: 25 mmol/L (ref 22–32)
Calcium: 8.4 mg/dL — ABNORMAL LOW (ref 8.9–10.3)
Chloride: 101 mmol/L (ref 98–111)
Creatinine, Ser: 1.09 mg/dL (ref 0.61–1.24)
GFR, Estimated: >60 mL/min
Glucose, Bld: 108 mg/dL — ABNORMAL HIGH (ref 70–99)
Potassium: 4.4 mmol/L (ref 3.5–5.1)
Sodium: 136 mmol/L (ref 135–145)

## 2024-08-27 LAB — GLUCOSE, CAPILLARY
Glucose-Capillary: 105 mg/dL — ABNORMAL HIGH (ref 70–99)
Glucose-Capillary: 125 mg/dL — ABNORMAL HIGH (ref 70–99)
Glucose-Capillary: 119 mg/dL — ABNORMAL HIGH (ref 70–99)
Glucose-Capillary: 166 mg/dL — ABNORMAL HIGH (ref 70–99)

## 2024-08-27 SURGERY — ARTHROPLASTY, HIP, TOTAL, ANTERIOR APPROACH
Anesthesia: General | Site: Hip | Laterality: Left

## 2024-08-27 MED ORDER — DOCUSATE SODIUM 100 MG PO CAPS
100.0000 mg | ORAL_CAPSULE | Freq: Two times a day (BID) | ORAL | Status: DC
  Administered 2024-08-27 – 2024-08-29 (×4): 100 mg via ORAL
  Filled 2024-08-27 (×5): qty 1

## 2024-08-27 MED ORDER — POLYETHYLENE GLYCOL 3350 17 G PO PACK: 17.0000 g | PACK | Freq: Every day | ORAL | Status: DC

## 2024-08-27 MED ORDER — ALUM & MAG HYDROXIDE-SIMETH 200-200-20 MG/5ML PO SUSP: 30.0000 mL | ORAL | Status: DC | PRN

## 2024-08-27 MED ORDER — MENTHOL 3 MG MT LOZG: 1.0000 | LOZENGE | OROMUCOSAL | Status: DC | PRN

## 2024-08-27 MED ORDER — 0.9 % SODIUM CHLORIDE (POUR BTL) OPTIME
TOPICAL | Status: DC | PRN
  Administered 2024-08-27: 1000 mL

## 2024-08-27 MED ORDER — AMISULPRIDE (ANTIEMETIC) 5 MG/2ML IV SOLN
INTRAVENOUS | Status: AC
  Filled 2024-08-27: qty 4

## 2024-08-27 MED ORDER — PHENYLEPHRINE 80 MCG/ML (10ML) SYRINGE FOR IV PUSH (FOR BLOOD PRESSURE SUPPORT)
PREFILLED_SYRINGE | INTRAVENOUS | Status: AC
  Filled 2024-08-27: qty 10

## 2024-08-27 MED ORDER — AMISULPRIDE (ANTIEMETIC) 5 MG/2ML IV SOLN
10.0000 mg | Freq: Once | INTRAVENOUS | Status: AC | PRN
Start: 2024-08-27 — End: 2024-08-27
  Administered 2024-08-27: 10 mg via INTRAVENOUS

## 2024-08-27 MED ORDER — OXYCODONE HCL 5 MG/5ML PO SOLN: 5.0000 mg | Freq: Once | ORAL | Status: DC | PRN

## 2024-08-27 MED ORDER — ACETAMINOPHEN 500 MG PO TABS
1000.0000 mg | ORAL_TABLET | Freq: Once | ORAL | Status: AC
  Administered 2024-08-27: 1000 mg via ORAL

## 2024-08-27 MED ORDER — ASPIRIN 81 MG PO CHEW
81.0000 mg | CHEWABLE_TABLET | Freq: Two times a day (BID) | ORAL | Status: DC
  Administered 2024-08-27 – 2024-08-31 (×8): 81 mg via ORAL
  Filled 2024-08-27 (×8): qty 1

## 2024-08-27 MED ORDER — PROPOFOL 10 MG/ML IV BOLUS
INTRAVENOUS | Status: AC
  Filled 2024-08-27: qty 20

## 2024-08-27 MED ORDER — PHENYLEPHRINE 80 MCG/ML (10ML) SYRINGE FOR IV PUSH (FOR BLOOD PRESSURE SUPPORT)
PREFILLED_SYRINGE | INTRAVENOUS | Status: DC | PRN
  Administered 2024-08-27: 160 ug via INTRAVENOUS
  Administered 2024-08-27 (×3): 80 ug via INTRAVENOUS

## 2024-08-27 MED ORDER — POVIDONE-IODINE 10 % EX SWAB
2.0000 | Freq: Once | CUTANEOUS | Status: AC
  Administered 2024-08-27: 2 via TOPICAL

## 2024-08-27 MED ORDER — CHLORHEXIDINE GLUCONATE 0.12 % MT SOLN
OROMUCOSAL | Status: AC
  Administered 2024-08-27: 15 mL via OROMUCOSAL
  Filled 2024-08-27: qty 15

## 2024-08-27 MED ORDER — METOCLOPRAMIDE HCL 5 MG PO TABS: 5.0000 mg | ORAL_TABLET | Freq: Three times a day (TID) | ORAL | Status: DC | PRN

## 2024-08-27 MED ORDER — BUPIVACAINE-MELOXICAM ER 400-12 MG/14ML IJ SOLN
INTRAMUSCULAR | Status: DC | PRN
  Administered 2024-08-27: 400 mg

## 2024-08-27 MED ORDER — MAGNESIUM CITRATE PO SOLN: 1.0000 | Freq: Once | ORAL | Status: DC | PRN

## 2024-08-27 MED ORDER — PHENYLEPHRINE HCL-NACL 20-0.9 MG/250ML-% IV SOLN
INTRAVENOUS | Status: DC | PRN
  Administered 2024-08-27: 25 ug/min via INTRAVENOUS

## 2024-08-27 MED ORDER — ROCURONIUM BROMIDE 10 MG/ML (PF) SYRINGE
PREFILLED_SYRINGE | INTRAVENOUS | Status: DC | PRN
  Administered 2024-08-27: 60 mg via INTRAVENOUS
  Administered 2024-08-27: 10 mg via INTRAVENOUS

## 2024-08-27 MED ORDER — CHLORHEXIDINE GLUCONATE 0.12 % MT SOLN: 15.0000 mL | Freq: Once | OROMUCOSAL | Status: AC

## 2024-08-27 MED ORDER — FENTANYL CITRATE (PF) 100 MCG/2ML IJ SOLN
25.0000 ug | INTRAMUSCULAR | Status: DC | PRN
  Administered 2024-08-27 (×2): 25 ug via INTRAVENOUS
  Administered 2024-08-27: 50 ug via INTRAVENOUS

## 2024-08-27 MED ORDER — PRONTOSAN WOUND IRRIGATION OPTIME
TOPICAL | Status: DC | PRN
  Administered 2024-08-27: 350 mL

## 2024-08-27 MED ORDER — ONDANSETRON HCL 4 MG/2ML IJ SOLN: 4.0000 mg | Freq: Four times a day (QID) | INTRAMUSCULAR | Status: DC | PRN

## 2024-08-27 MED ORDER — HYDROCODONE-ACETAMINOPHEN 7.5-325 MG PO TABS
1.0000 | ORAL_TABLET | Freq: Three times a day (TID) | ORAL | Status: DC | PRN
  Administered 2024-08-30: 1 via ORAL
  Administered 2024-08-31: 2 via ORAL
  Filled 2024-08-27: qty 2
  Filled 2024-08-27 (×2): qty 1

## 2024-08-27 MED ORDER — ONDANSETRON HCL 4 MG PO TABS
4.0000 mg | ORAL_TABLET | Freq: Four times a day (QID) | ORAL | Status: DC | PRN
  Administered 2024-08-29: 4 mg via ORAL
  Filled 2024-08-27: qty 1

## 2024-08-27 MED ORDER — ASPIRIN 81 MG PO TBEC: 81.0000 mg | DELAYED_RELEASE_TABLET | Freq: Two times a day (BID) | ORAL | 0 refills | Status: DC

## 2024-08-27 MED ORDER — BUPIVACAINE-MELOXICAM ER 400-12 MG/14ML IJ SOLN
INTRAMUSCULAR | Status: AC
  Filled 2024-08-27: qty 1

## 2024-08-27 MED ORDER — CEFAZOLIN SODIUM-DEXTROSE 2-4 GM/100ML-% IV SOLN
2.0000 g | Freq: Four times a day (QID) | INTRAVENOUS | Status: AC
  Administered 2024-08-27 – 2024-08-28 (×2): 2 g via INTRAVENOUS
  Filled 2024-08-27 (×2): qty 100

## 2024-08-27 MED ORDER — EPHEDRINE SULFATE-NACL 50-0.9 MG/10ML-% IV SOSY: PREFILLED_SYRINGE | INTRAVENOUS | Status: DC | PRN

## 2024-08-27 MED ORDER — ACETAMINOPHEN 500 MG PO TABS
1000.0000 mg | ORAL_TABLET | Freq: Four times a day (QID) | ORAL | Status: AC
  Administered 2024-08-28 (×4): 1000 mg via ORAL
  Filled 2024-08-27 (×4): qty 2

## 2024-08-27 MED ORDER — DEXAMETHASONE SOD PHOSPHATE PF 10 MG/ML IJ SOLN
10.0000 mg | Freq: Once | INTRAMUSCULAR | Status: AC
  Administered 2024-08-28: 10 mg via INTRAVENOUS

## 2024-08-27 MED ORDER — HYDROCODONE-ACETAMINOPHEN 5-325 MG PO TABS
1.0000 | ORAL_TABLET | Freq: Three times a day (TID) | ORAL | Status: DC | PRN
  Administered 2024-08-27: 1 via ORAL
  Administered 2024-08-28 (×2): 2 via ORAL
  Administered 2024-08-29: 1 via ORAL
  Administered 2024-08-29: 2 via ORAL
  Filled 2024-08-27: qty 1
  Filled 2024-08-27 (×4): qty 2

## 2024-08-27 MED ORDER — OXYCODONE-ACETAMINOPHEN 5-325 MG PO TABS: 1.0000 | ORAL_TABLET | Freq: Two times a day (BID) | ORAL | 0 refills | Status: DC | PRN

## 2024-08-27 MED ORDER — FENTANYL CITRATE (PF) 100 MCG/2ML IJ SOLN
INTRAMUSCULAR | Status: DC | PRN
  Administered 2024-08-27 (×4): 50 ug via INTRAVENOUS

## 2024-08-27 MED ORDER — LIDOCAINE 2% (20 MG/ML) 5 ML SYRINGE
INTRAMUSCULAR | Status: DC | PRN
  Administered 2024-08-27: 40 mg via INTRAVENOUS

## 2024-08-27 MED ORDER — SORBITOL 70 % SOLN
30.0000 mL | Freq: Every day | Status: DC | PRN
Start: 2024-08-27 — End: 2024-08-30

## 2024-08-27 MED ORDER — MIDAZOLAM HCL 2 MG/2ML IJ SOLN
INTRAMUSCULAR | Status: AC
  Filled 2024-08-27: qty 2

## 2024-08-27 MED ORDER — FENTANYL CITRATE (PF) 100 MCG/2ML IJ SOLN
INTRAMUSCULAR | Status: AC
  Filled 2024-08-27: qty 2

## 2024-08-27 MED ORDER — METOCLOPRAMIDE HCL 5 MG/ML IJ SOLN: 5.0000 mg | Freq: Three times a day (TID) | INTRAMUSCULAR | Status: DC | PRN

## 2024-08-27 MED ORDER — ONDANSETRON HCL 4 MG/2ML IJ SOLN
INTRAMUSCULAR | Status: AC
Start: 2024-08-27 — End: 2024-08-27
  Filled 2024-08-27: qty 2

## 2024-08-27 MED ORDER — OXYCODONE HCL 5 MG PO TABS: 5.0000 mg | ORAL_TABLET | Freq: Once | ORAL | Status: DC | PRN

## 2024-08-27 MED ORDER — TRANEXAMIC ACID-NACL 1000-0.7 MG/100ML-% IV SOLN
1000.0000 mg | Freq: Once | INTRAVENOUS | Status: AC
  Administered 2024-08-27: 1000 mg via INTRAVENOUS
  Filled 2024-08-27: qty 100

## 2024-08-27 MED ORDER — DIPHENHYDRAMINE HCL 12.5 MG/5ML PO ELIX
25.0000 mg | ORAL_SOLUTION | ORAL | Status: DC | PRN
Start: 2024-08-27 — End: 2024-08-31

## 2024-08-27 MED ORDER — ACETAMINOPHEN 500 MG PO TABS
ORAL_TABLET | ORAL | Status: AC
  Filled 2024-08-27: qty 2

## 2024-08-27 MED ORDER — CEFAZOLIN SODIUM-DEXTROSE 2-4 GM/100ML-% IV SOLN
2.0000 g | INTRAVENOUS | Status: AC
  Administered 2024-08-27: 2 g via INTRAVENOUS
  Filled 2024-08-27: qty 100

## 2024-08-27 MED ORDER — LIDOCAINE 2% (20 MG/ML) 5 ML SYRINGE
INTRAMUSCULAR | Status: AC
Start: 2024-08-27 — End: 2024-08-27
  Filled 2024-08-27: qty 5

## 2024-08-27 MED ORDER — METHOCARBAMOL 500 MG PO TABS
500.0000 mg | ORAL_TABLET | Freq: Four times a day (QID) | ORAL | Status: DC | PRN
Start: 2024-08-27 — End: 2024-08-31
  Administered 2024-08-29 – 2024-08-31 (×2): 500 mg via ORAL
  Filled 2024-08-27 (×2): qty 1

## 2024-08-27 MED ORDER — SUGAMMADEX SODIUM 200 MG/2ML IV SOLN
INTRAVENOUS | Status: DC | PRN
  Administered 2024-08-27: 160 mg via INTRAVENOUS

## 2024-08-27 MED ORDER — SODIUM CHLORIDE 0.9 % IV SOLN: INTRAVENOUS | Status: DC

## 2024-08-27 MED ORDER — MORPHINE SULFATE (PF) 2 MG/ML IV SOLN: 0.5000 mg | Freq: Four times a day (QID) | INTRAVENOUS | Status: DC | PRN

## 2024-08-27 MED ORDER — LACTATED RINGERS IV SOLN: INTRAVENOUS | Status: DC

## 2024-08-27 MED ORDER — VANCOMYCIN HCL 1 G IV SOLR
INTRAVENOUS | Status: DC | PRN
  Administered 2024-08-27: 1000 mg via TOPICAL

## 2024-08-27 MED ORDER — PHENOL 1.4 % MT LIQD: 1.0000 | OROMUCOSAL | Status: DC | PRN

## 2024-08-27 MED ORDER — VANCOMYCIN HCL 1000 MG IV SOLR
INTRAVENOUS | Status: AC
Start: 2024-08-27 — End: 2024-08-27
  Filled 2024-08-27: qty 20

## 2024-08-27 MED ORDER — ORAL CARE MOUTH RINSE: 15.0000 mL | Freq: Once | OROMUCOSAL | Status: AC

## 2024-08-27 MED ORDER — PANTOPRAZOLE SODIUM 40 MG PO TBEC
40.0000 mg | DELAYED_RELEASE_TABLET | Freq: Every day | ORAL | Status: DC
  Administered 2024-08-28 – 2024-08-31 (×4): 40 mg via ORAL
  Filled 2024-08-27 (×4): qty 1

## 2024-08-27 MED ORDER — STERILE WATER FOR IRRIGATION IR SOLN
Status: DC | PRN
  Administered 2024-08-27: 1000 mL

## 2024-08-27 MED ORDER — DEXAMETHASONE SOD PHOSPHATE PF 10 MG/ML IJ SOLN
INTRAMUSCULAR | Status: DC | PRN
Start: 2024-08-27 — End: 2024-08-27
  Administered 2024-08-27: 5 mg via INTRAVENOUS

## 2024-08-27 MED ORDER — TRANEXAMIC ACID-NACL 1000-0.7 MG/100ML-% IV SOLN
1000.0000 mg | INTRAVENOUS | Status: AC
  Administered 2024-08-27: 1000 mg via INTRAVENOUS
  Filled 2024-08-27: qty 100

## 2024-08-27 MED ORDER — METHOCARBAMOL 1000 MG/10ML IJ SOLN
500.0000 mg | Freq: Four times a day (QID) | INTRAMUSCULAR | Status: DC | PRN
Start: 2024-08-27 — End: 2024-08-31

## 2024-08-27 MED ORDER — ONDANSETRON HCL 4 MG/2ML IJ SOLN
INTRAMUSCULAR | Status: DC | PRN
  Administered 2024-08-27: 4 mg via INTRAVENOUS

## 2024-08-27 MED ORDER — PROPOFOL 10 MG/ML IV BOLUS
INTRAVENOUS | Status: DC | PRN
  Administered 2024-08-27: 110 mg via INTRAVENOUS

## 2024-08-27 MED ORDER — ACETAMINOPHEN 325 MG PO TABS
325.0000 mg | ORAL_TABLET | Freq: Four times a day (QID) | ORAL | Status: DC | PRN
  Administered 2024-08-30: 650 mg via ORAL
  Filled 2024-08-27 (×2): qty 2

## 2024-08-27 MED ORDER — SODIUM CHLORIDE 0.9 % IR SOLN
Status: DC | PRN
  Administered 2024-08-27: 1000 mL

## 2024-08-27 MED ORDER — LABETALOL HCL 5 MG/ML IV SOLN
INTRAVENOUS | Status: AC
  Filled 2024-08-27: qty 4

## 2024-08-27 MED ORDER — TRANEXAMIC ACID 1000 MG/10ML IV SOLN
INTRAVENOUS | Status: DC | PRN
  Administered 2024-08-27: 2000 mg via TOPICAL

## 2024-08-27 MED ORDER — LABETALOL HCL 5 MG/ML IV SOLN
10.0000 mg | Freq: Once | INTRAVENOUS | Status: AC
  Administered 2024-08-27: 10 mg via INTRAVENOUS

## 2024-08-27 SURGICAL SUPPLY — 52 items
BAG COUNTER SPONGE SURGICOUNT (BAG) ×1 IMPLANT
BAG DECANTER FOR FLEXI CONT (MISCELLANEOUS) ×1 IMPLANT
BLADE SAG 18X100X1.27 (BLADE) ×1 IMPLANT
COOLER ICEMAN CLASSIC (MISCELLANEOUS) IMPLANT
COVER PERINEAL POST (MISCELLANEOUS) ×1 IMPLANT
COVER SURGICAL LIGHT HANDLE (MISCELLANEOUS) ×1 IMPLANT
CUP ACETAB W/GRIPTION 54 (Plate) IMPLANT
DERMABOND ADVANCED .7 DNX12 (GAUZE/BANDAGES/DRESSINGS) IMPLANT
DRAPE C-ARM 42X72 X-RAY (DRAPES) ×1 IMPLANT
DRAPE POUCH INSTRU U-SHP 10X18 (DRAPES) ×1 IMPLANT
DRAPE STERI IOBAN 125X83 (DRAPES) ×1 IMPLANT
DRAPE U-SHAPE 47X51 STRL (DRAPES) ×2 IMPLANT
DRSG AQUACEL AG ADV 3.5X10 (GAUZE/BANDAGES/DRESSINGS) ×1 IMPLANT
DURAPREP 26ML APPLICATOR (WOUND CARE) ×2 IMPLANT
ELECTRODE BLDE 4.0 EZ CLN MEGD (MISCELLANEOUS) ×1 IMPLANT
ELECTRODE REM PT RTRN 9FT ADLT (ELECTROSURGICAL) ×1 IMPLANT
GLOVE BIOGEL PI IND STRL 7.0 (GLOVE) ×2 IMPLANT
GLOVE BIOGEL PI IND STRL 7.5 (GLOVE) ×1 IMPLANT
GLOVE ECLIPSE 7.0 STRL STRAW (GLOVE) ×5 IMPLANT
GLOVE INDICATOR 7.0 STRL GRN (GLOVE) ×1 IMPLANT
GLOVE SURG SYN 7.5 PF PI (GLOVE) ×5 IMPLANT
GOWN STRL REUS W/ TWL LRG LVL3 (GOWN DISPOSABLE) IMPLANT
GOWN STRL REUS W/ TWL XL LVL3 (GOWN DISPOSABLE) ×1 IMPLANT
GOWN STRL SURGICAL XL XLNG (GOWN DISPOSABLE) ×1 IMPLANT
GOWN TOGA ZIPPER T7+ PEEL AWAY (MISCELLANEOUS) ×1 IMPLANT
HEAD ARTICULEZE (Hips) IMPLANT
HOOD PEEL AWAY T7 (MISCELLANEOUS) ×1 IMPLANT
IV 0.9% NACL 1000 ML (IV SOLUTION) ×1 IMPLANT
KIT BASIN OR (CUSTOM PROCEDURE TRAY) ×1 IMPLANT
LINER NEUTRAL 54X36MM PLUS 4 (Hips) IMPLANT
MARKER SKIN DUAL TIP RULER LAB (MISCELLANEOUS) ×1 IMPLANT
NDL SPNL 18GX3.5 QUINCKE PK (NEEDLE) ×1 IMPLANT
NEEDLE SPNL 18GX3.5 QUINCKE PK (NEEDLE) ×1 IMPLANT
PACK TOTAL JOINT (CUSTOM PROCEDURE TRAY) ×1 IMPLANT
PACK UNIVERSAL I (CUSTOM PROCEDURE TRAY) ×1 IMPLANT
PAD COLD UNI XL WRAP-ON (PAD) IMPLANT
SCREW 6.5MMX25MM (Screw) IMPLANT
SCREW PINN CAN 6.5X20 (Screw) IMPLANT
SET HNDPC FAN SPRY TIP SCT (DISPOSABLE) ×1 IMPLANT
SOLUTION PRONTOSAN WOUND 350ML (IRRIGATION / IRRIGATOR) ×1 IMPLANT
STEM FEM ACTIS HIGH SZ7 (Stem) IMPLANT
SUT ETHIBOND 2 V 37 (SUTURE) ×1 IMPLANT
SUT ETHILON 2 0 FS 18 (SUTURE) IMPLANT
SUT STRATAFIX PDS+ 0 24IN (SUTURE) IMPLANT
SUT VIC AB 0 CT1 27XBRD ANBCTR (SUTURE) ×1 IMPLANT
SUT VIC AB 1 CTX36XBRD ANBCTR (SUTURE) ×1 IMPLANT
SUT VIC AB 2-0 CT1 TAPERPNT 27 (SUTURE) ×2 IMPLANT
SYR 30ML LL (SYRINGE) ×2 IMPLANT
TOWEL GREEN STERILE (TOWEL DISPOSABLE) ×1 IMPLANT
TRAY FOLEY W/BAG SLVR 16FR ST (SET/KITS/TRAYS/PACK) IMPLANT
TUBE SUCT ARGYLE STRL (TUBING) ×1 IMPLANT
YANKAUER SUCT BULB TIP NO VENT (SUCTIONS) ×1 IMPLANT

## 2024-08-27 NOTE — Transfer of Care (Signed)
 Immediate Anesthesia Transfer of Care Note  Patient: Gary Wright  Procedure(s) Performed: ARTHROPLASTY, HIP, TOTAL, ANTERIOR APPROACH (Left: Hip)  Patient Location: PACU  Anesthesia Type:General  Level of Consciousness: awake and sedated  Airway & Oxygen Therapy: Patient Spontanous Breathing and Patient connected to face mask oxygen  Post-op Assessment: Report given to RN and Post -op Vital signs reviewed and stable  Post vital signs: Reviewed and stable  Last Vitals:  Vitals Value Taken Time  BP 145/94   Temp    Pulse 93   Resp 19   SpO2 97%     Last Pain:  Vitals:   08/27/24 1340  TempSrc: Oral  PainSc: 8       Patients Stated Pain Goal: 0 (08/23/24 0517)  Complications: No notable events documented.

## 2024-08-27 NOTE — Progress Notes (Signed)
 Mobility Specialist Progress Note:    08/27/24 0930  Mobility  Activity  (lateral scoots in bed)  Level of Assistance Minimal assist, patient does 75% or more  Activity Response Tolerated well  Mobility Referral Yes  Mobility visit 1 Mobility  Mobility Specialist Start Time (ACUTE ONLY) [id number]  Mobility Specialist Stop Time (ACUTE ONLY) [id number]  Mobility Specialist Time Calculation (min) (ACUTE ONLY) 16 min   Pt received in bed, hesitant but agreeable to mobility. C/o LLE pain, otherwise tolerated well. Required MinA to get to EOB. Sat EOB for a couple minutes to take medications. Attempted to stand. Unable to d/t pain. Was able to laterally scoot towards HOB. Left supine in bed w/ call bell and personal belongings in reach. All needs met. Bed alarm on.  Thersia Minder Mobility Specialist  Please contact vis Secure Chat or  Rehab Office [phone]

## 2024-08-27 NOTE — H&P (Signed)
 PREOPERATIVE H&P  Chief Complaint: LEFT HIP FRACTURE  HPI: Gary Wright is a 78 y.o. male who presents for surgical treatment of LEFT HIP FRACTURE.  He denies any changes in medical history.  Past Surgical History:  Procedure Laterality Date  . BACK SURGERY     fusion in 1973  . CARDIAC CATHETERIZATION N/A 06/13/2016   Procedure: Left Heart Cath and Coronary Angiography;  Surgeon: Lonni Hanson, MD;  Location: [facility] INVASIVE CV LAB;  Service: Cardiovascular;  Laterality: N/A;  . INGUINAL HERNIA REPAIR Right 04/21/2018   Procedure: RIGHT INGUINAL HERNIA REPAIR ERAS PATHWAY;  Surgeon: Vanderbilt Ned, MD;  Location: [facility] OR;  Service: General;  Laterality: Right;  TAP BLOCK  . INSERTION OF MESH Right 04/21/2018   Procedure: INSERTION OF MESH;  Surgeon: Vanderbilt Ned, MD;  Location: [facility] OR;  Service: General;  Laterality: Right;  . LUNG REMOVAL, PARTIAL Left    left upper lung removed about 6-7 years ago reported by pt  . TONSILLECTOMY     Social History   Socioeconomic History  . Marital status: Married    Spouse name: Deland  . Number of children: 2  . Years of education: Not on file  . Highest education level: Not on file  Occupational History  . Occupation: retired    Associate Professor: [employer]  Tobacco Use  . Smoking status: Former    Current packs/day: 0.00    Types: Cigarettes    Quit date: 2019    Years since quitting: 6.8  . Smokeless tobacco: Never  Vaping Use  . Vaping status: Never Used  Substance and Sexual Activity  . Alcohol use: No    Comment: No alcohol for 17 years  . Drug use: No  . Sexual activity: Not on file  Other Topics Concern  . Not on file  Social History Narrative   Married for 27 years with 3 children   Social Drivers of Corporate Investment Banker Strain: Low Risk  (11/18/2023)   Overall Financial Resource Strain (CARDIA)   . Difficulty of Paying Living Expenses: Not hard at all  Food Insecurity: No Food Insecurity  (08/19/2024)   Hunger Vital Sign   . Worried About Programme Researcher, Broadcasting/film/video in the Last Year: Never true   . Ran Out of Food in the Last Year: Never true  Transportation Needs: No Transportation Needs (08/19/2024)   PRAPARE - Transportation   . Lack of Transportation (Medical): No   . Lack of Transportation (Non-Medical): No  Physical Activity: Insufficiently Active (11/18/2023)   Exercise Vital Sign   . Days of Exercise per Week: 3 days   . Minutes of Exercise per Session: 10 min  Stress: No Stress Concern Present (11/18/2023)   [employer] of Occupational Health - Occupational Stress Questionnaire   . Feeling of Stress : Not at all  Social Connections: Moderately Isolated (08/19/2024)   Social Connection and Isolation Panel   . Frequency of Communication with Friends and Family: Three times a week   . Frequency of Social Gatherings with Friends and Family: Never   . Attends Religious Services: Never   . Active Member of Clubs or Organizations: No   . Attends Banker Meetings: Never   . Marital Status: Married   Family History  Problem Relation Age of Onset  . Hypertension Mother   . Hypertension Father    No Known Allergies Prior to Admission medications   Medication Sig Start Date End Date Taking? Authorizing  Provider  albuterol  (PROVENTIL ) (2.5 MG/3ML) 0.083% nebulizer solution Take 3 mLs (2.5 mg total) by nebulization every 4 (four) hours as needed for wheezing or shortness of breath. 06/09/24  Yes [name] Dorn NOVAK, MD  albuterol  (VENTOLIN  HFA) 108 (90 Base) MCG/ACT inhaler Inhale 1-2 puffs into the lungs every 4 (four) hours as needed for wheezing or shortness of breath. 06/09/24  Yes [name] Dorn NOVAK, MD  atorvastatin  (LIPITOR) 40 MG tablet TAKE 1 TABLET(40 MG) BY MOUTH DAILY AT 6 PM 08/18/23  Yes Pietro Redell RAMAN, MD  budesonide  (PULMICORT ) 0.5 MG/2ML nebulizer solution Take 2 mLs (0.5 mg total) by nebulization 2 (two) times daily. 01/13/24  Yes [name] Dorn NOVAK, MD  buPROPion  (WELLBUTRIN  XL) 300 MG 24 hr tablet Take 300 mg by mouth daily. 05/11/16  Yes [provider]  citalopram  (CELEXA ) 40 MG tablet Take 40 mg by mouth daily. 06/01/16  Yes [provider]  cyclobenzaprine  (FLEXERIL ) 5 MG tablet Take 1 tablet (5 mg total) by mouth 3 (three) times daily as needed for muscle spasms. 03/23/24  Yes Lucius Krabbe, NP  diclofenac Sodium (VOLTAREN) 1 % GEL Apply 4 g topically 4 (four) times daily as needed. Rub on sore joints. 08/09/24  Yes Hudnell, Krabbe, NP  Dupilumab  (DUPIXENT ) 300 MG/2ML SOAJ Inject 300 mg into the skin every 14 (fourteen) days. 07/21/24  Yes [name] Dorn NOVAK, MD  Fluticasone -Umeclidin-Vilant (TRELEGY ELLIPTA ) 200-62.5-25 MCG/ACT AEPB Inhale 1 puff into the lungs every evening.   Yes [provider]  furosemide  (LASIX ) 20 MG tablet Take 1 tablet (20 mg total) by mouth daily. 08/15/24  Yes Zackowski, Scott, MD  glipiZIDE  (GLUCOTROL  XL) 2.5 MG 24 hr tablet Take 1 tablet (2.5 mg total) by mouth as directed. Take 2 pills in the morning and 1 pill in the evening. 08/09/24  Yes Lucius Krabbe, NP  HYDROcodone-acetaminophen  (NORCO/VICODIN) 5-325 MG tablet Take 1 tablet by mouth every 6 (six) hours as needed. 08/15/24  Yes Scott, Rocky SAILOR, PA-C  ipratropium-albuterol  (DUONEB) 0.5-2.5 (3) MG/3ML SOLN Take 3 mLs by nebulization 4 (four) times daily. 12/22/23  Yes Darlean Ozell NOVAK, MD  metoprolol  succinate (TOPROL -XL) 25 MG 24 hr tablet Take 0.5 tablets (12.5 mg total) by mouth See admin instructions. Take 1/2 tablet (12.5mg ) by mouth once daily at noon. 08/06/24  Yes Arlon Carliss ORN, DO  olmesartan  (BENICAR ) 40 MG tablet Take 1 tablet (40 mg total) by mouth daily. 06/29/24 06/29/25 Yes Darlean Ozell NOVAK, MD  pantoprazole  (PROTONIX ) 40 MG tablet One tablet by mouth daily 30 to 60 minutes before the first meal of the day Patient taking differently: Take 40 mg by mouth daily as needed (heartburn, acid reflux). 05/26/24   Yes Darlean Ozell NOVAK, MD  predniSONE  (DELTASONE ) 10 MG tablet Take 1 tablet (10 mg total) by mouth daily with breakfast. 07/20/24  Yes [name] Dorn NOVAK, MD  triamcinolone  ointment (KENALOG ) 0.5 % Apply topically 2 (two) times daily. Patient taking differently: Apply 1 Application topically 2 (two) times daily as needed (skin irritaiton). 06/24/24  Yes Lucius Krabbe, NP     Positive ROS: All other systems have been reviewed and were otherwise negative with the exception of those mentioned in the HPI and as above.  Physical Exam: General: Alert, no acute distress Cardiovascular: No pedal edema Respiratory: No cyanosis, no use of accessory musculature GI: abdomen soft Skin: No lesions in the area of chief complaint Neurologic: Sensation intact distally Psychiatric: Patient is competent for consent with normal mood and affect Lymphatic:  no lymphedema  MUSCULOSKELETAL: exam stable  Assessment: LEFT HIP FRACTURE  Plan: Plan for Procedure(s): ARTHROPLASTY, HIP, TOTAL, ANTERIOR APPROACH  The risks benefits and alternatives were discussed with the patient including but not limited to the risks of nonoperative treatment, versus surgical intervention including infection, bleeding, nerve injury,  blood clots, cardiopulmonary complications, morbidity, mortality, among others, and they were willing to proceed.   Ozell Cummins, MD 08/27/2024 7:09 AM

## 2024-08-27 NOTE — Progress Notes (Signed)
 PROGRESS NOTE    [name]  FMW:[id number] DOB: [birth year] DOA: 08/18/2024 PCP: Lucius Krabbe, NP   Brief Narrative: Gary Wright is a 78 y.o. male with a history of COPD, asthma, CAD, hyperlipidemia, hypertension, stress-induced cardiomyopathy, chronic avascular necrosis of left hip.  Patient presented secondary to worsening left hip pain in setting of known vascular necrosis.  MRI findings significant for findings most consistent with rapidly progressive femoral head lysis osteonecrosis.  Orthopedic surgery was consulted and recommended total hip arthroplasty which is planned for 11/7.   Assessment and Plan:  Acute on chronic left hip pain Osteonecrosis of the left hip Orthopedic surgery was consulted and plan for total hip arthroplasty on 11/7. - Continue analgesics for pain management  COPD Stable.  No wheezing. - Continue Breztri and DuoNebs  Primary pretension - Continue irbesartan and metoprolol  succinate  Elevated creatinine Mild.  Baseline creatinine of about 1. Creatinine peak of 1.26, not meeting criteria for AKI.  Leukocytosis Mild.  Resolved.  Chronic diastolic heart failure Stable.  Lasix  held secondary to AKI on admission.  Constipation Resolved.   DVT prophylaxis: Subcutaneous heparin  Code Status:   Code Status: Full Code Family Communication: None at bedside Disposition Plan: Discharge pending orthopedic surgery recommendations/management   Consultants:  Orthopedic surgery  Procedures:  None  Antimicrobials: None    Subjective: No issues this morning. Awaiting his surgery.  Objective: BP 136/76   Pulse 84   Temp 98.1 F (36.7 C) (Oral)   Resp 18   Ht 5' 8 (1.727 m)   Wt 82.1 kg   SpO2 96%   BMI 27.52 kg/m   Examination:  General exam: Appears calm and comfortable.   Data Reviewed: I have personally reviewed following labs and imaging studies  CBC Lab Results  Component Value Date   WBC 7.9  08/26/2024   RBC 3.78 (L) 08/26/2024   HGB 12.0 (L) 08/26/2024   HCT 36.9 (L) 08/26/2024   MCV 97.6 08/26/2024   MCH 31.7 08/26/2024   PLT 216 08/26/2024   MCHC 32.5 08/26/2024   RDW 13.3 08/26/2024   LYMPHSABS 1.4 08/24/2024   MONOABS 0.9 08/24/2024   EOSABS 0.2 08/24/2024   BASOSABS 0.1 08/24/2024     Last metabolic panel Lab Results  Component Value Date   NA 136 08/27/2024   K 4.4 08/27/2024   CL 101 08/27/2024   CO2 25 08/27/2024   BUN 11 08/27/2024   CREATININE 1.09 08/27/2024   GLUCOSE 108 (H) 08/27/2024   GFRNONAA >60 08/27/2024   GFRAA 57 (L) 12/14/2019   CALCIUM  8.4 (L) 08/27/2024   PHOS 3.5 03/27/2022   PROT 6.0 (L) 08/15/2024   ALBUMIN 3.4 (L) 08/15/2024   LABGLOB 1.8 01/18/2021   AGRATIO 2.6 (H) 01/18/2021   BILITOT 0.7 08/15/2024   ALKPHOS 78 08/15/2024   AST 18 08/15/2024   ALT 27 08/15/2024   ANIONGAP 10 08/27/2024    GFR: Estimated Creatinine Clearance: 58.4 mL/min (by C-G formula based on SCr of 1.09 mg/dL).  Recent Results (from the past 240 hours)  Body fluid culture w Gram Stain     Status: None   Collection Time: 08/19/24 10:21 AM   Specimen: Body Fluid  Result Value Ref Range Status   Specimen Description FLUID HIP  Final   Special Requests Normal  Final   Gram Stain NO WBC SEEN NO ORGANISMS SEEN   Final   Culture   Final    NO GROWTH 3 DAYS Performed at [facility]  [facility], [address] [place], [place] [address]    Report Status 08/22/2024 FINAL  Final  Surgical pcr screen     Status: None   Collection Time: 08/26/24  5:06 PM   Specimen: Nasal Mucosa; Nasal Swab  Result Value Ref Range Status   MRSA, PCR NEGATIVE NEGATIVE Final   Staphylococcus aureus NEGATIVE NEGATIVE Final    Comment: (NOTE) The Xpert SA Assay (FDA approved for NASAL specimens in patients 59 years of age and older), is one component of a comprehensive surveillance program. It is not intended to diagnose infection nor to guide or monitor  treatment. Performed at [facility], [address] [place], [place] [address]       Radiology Studies: No results found.    LOS: 8 days    Elgin Lam, MD [facility] 08/27/2024, 11:26 AM   If 7PM-7AM, please contact night-coverage [email or web address]

## 2024-08-27 NOTE — Discharge Instructions (Signed)

## 2024-08-27 NOTE — Op Note (Signed)
 ARTHROPLASTY, HIP, TOTAL, ANTERIOR APPROACH  Procedure Note Rickardo Brinegar   [id number]  Pre-op Diagnosis: Left hip avascular necrosis     Post-op Diagnosis: same  Operative Findings Severe AVN, collapse and remodeling of femoral head Deficient posterior wall of acetabulum Correction of significant shortening of operative extremity   Operative Procedures  1. Total hip replacement; Left hip; uncemented [id number]   Surgeon: Kay Cummins, M.D.  Assist: Magdalene Fireman, RNFA   Anesthesia: general  Prosthesis: Depuy Acetabulum: Pinnacle 54 mm Femur: Actis 7 HO Head: 36 mm size: +5 Liner: +4 Bearing Type: metal/poly  Total Hip Arthroplasty (Anterior Approach) Op Note:  After informed consent was obtained and the operative extremity marked in the holding area, the patient was brought back to the operating room and placed supine on the HANA table. Next, the operative extremity was prepped and draped in normal sterile fashion. Surgical timeout occurred verifying patient identification, surgical site, surgical procedure and administration of antibiotics.  A 10 cm longitudinal incision was made starting from 2 fingerbreadths lateral and inferior to the ASIS towards the lateral aspect of the patella.  A Hueter approach to the hip was performed, using the interval between tensor fascia lata and sartorius.  Dissection was carried bluntly down onto the anterior hip capsule. The lateral femoral circumflex vessels were identified and coagulated. A capsulectomy was performed because the capsule was severely scarred and degenerative.  The neck osteotomy was performed 1 fingerbreadth above the lesser trochanter. The femoral head was removed which showed collapse and bilobed remodeling, the acetabular rim was cleared of soft tissue and osteophytes and attention was turned to reaming the acetabulum.  The posterior wall of the acetabulum was slightly deficient due to wear from the femoral head.  Sequential  reaming was performed under fluoroscopic guidance down to the floor of the cotyloid fossa.  Then I reamed superiorly in order to find a good vault of bone for the cup.  Because the cup was placed more superiorly, there was overhanging anterior wall which would impinge on the trunion.  Therefore, I used a curved osteotome to remove the overhang anterior wall until it was flush with the cup.  We reamed to a size 53 mm, and then impacted the acetabular shell.  Fixation was good but I wanted to supplement the fixation with a 25 and 20 mm cancellous screw.  A +4 liner was then placed after irrigation and attention turned to the femur.  After placing the femoral hook, the leg was taken to externally rotated, extended and adducted position taking care to perform soft tissue releases to allow for adequate mobilization of the femur. Soft tissue was cleared from the shoulder of the greater trochanter and the hook elevator used to improve exposure of the proximal femur.  Lateral bone from the shoulder was rasped away for relief.  Sequential broaching performed up to a size 7.  High offset trial neck and +5 head were placed. The leg was brought back up to neutral and the construct reduced.  The position and sizing of components, offset and leg lengths were checked using fluoroscopy.  Based on fluoroscopic findings, we chose to go with it.  Stability of the construct was checked in 45 degrees of hip extension and 90 degrees of external rotation without any subluxation, shuck or impingement of prosthesis. We dislocated the prosthesis, dropped the leg back into position, removed trial components, and irrigated copiously. The final stem and head were chosen then placed, the leg brought back up,  the system reduced and fluoroscopy used to verify positioning.  Antibiotic irrigation was placed in the surgical wound.   We irrigated, obtained hemostasis and closed the capsule using #2 ethibond suture.  A topical mixture of 0.25%  bupivacaine  and meloxicam  was placed deep to the fascia.  One gram of vancomycin powder was placed in the surgical bed.   One gram of topical tranexamic acid was injected into the joint.  The fascia was closed with #1 stratafix, the deep fat layer was closed with 0 vicryl, the subcutaneous layers closed with 2.0 Vicryl Plus and the skin closed with 2.0 nylon and dermabond. A sterile dressing was applied. The patient was awakened in the operating room and taken to recovery in stable condition.  All sponge, needle, and instrument counts were correct at the end of the case.   Position: supine  Complications: see description of procedure.  Time Out: performed   Drains/Packing: none  Estimated blood loss: see anesthesia record  Returned to Recovery Room: in good condition.   Antibiotics: yes   Mechanical VTE (DVT) Prophylaxis: sequential compression devices, TED thigh-high  Chemical VTE (DVT) Prophylaxis: aspirin  81 mg BID   Fluid Replacement: see anesthesia record  Specimens Removed: 1 to pathology   Sponge and Instrument Count Correct? yes   PACU: portable radiograph - low AP   Plan/RTC: Return in 2 weeks for suture removal. Weight Bearing/Load Lower Extremity: full  Hip precautions: none Suture Removal: 2 weeks   N. Ozell Cummins, MD [facility] 5:26 PM   Implant Name Type Inv. Item Serial No. Manufacturer Lot No. LRB No. Used Action  CUP ACETAB W/GRIPTION 54 - [id number] Plate CUP ACETAB W/GRIPTION 54  [employer] [id number] Left 1 Implanted  SCREW PINN CAN 6.5X20 - [id number] Screw SCREW PINN CAN 6.5X20  [employer] [id number] Left 1 Implanted  SCREW 6.5MMX25MM - [id number] Screw SCREW 6.5MMX25MM  [employer] [id number] Left 1 Implanted  LINER NEUTRAL 54X36MM PLUS 4 - [id number] Hips LINER NEUTRAL 54X36MM PLUS 4  [employer] [id number] Left 1 Implanted  HEAD ARTICULEZE - [id number] Hips HEAD ARTICULEZE  [employer] [id number] Left 1 Implanted   STEM FEM ACTIS HIGH SZ7 - [id number] Stem STEM FEM ACTIS HIGH SZ7  [employer] [id number] Left 1 Implanted

## 2024-08-27 NOTE — Care Management Important Message (Signed)
 Important Message  Patient Details  Name: Gary Wright MRN: [id number] Date of Birth: Mar 09, 1946   Important Message Given:  Yes - Medicare IM     Jon Cruel 08/27/2024, 4:16 PM

## 2024-08-27 NOTE — Anesthesia Procedure Notes (Signed)
 Procedure Name: Intubation Date/Time: 08/27/2024 4:04 PM  Performed by: Elby Raelene SAUNDERS, CRNAPre-anesthesia Checklist: Patient identified, Emergency Drugs available, Suction available and Patient being monitored Patient Re-evaluated:Patient Re-evaluated prior to induction Oxygen Delivery Method: Circle System Utilized Preoxygenation: Pre-oxygenation with 100% oxygen Induction Type: IV induction Ventilation: Mask ventilation without difficulty Laryngoscope Size: Miller and 2 Grade View: Grade I Tube type: Oral Tube size: 7.5 mm Number of attempts: 1 Airway Equipment and Method: Stylet Placement Confirmation: ETT inserted through vocal cords under direct vision, positive ETCO2 and breath sounds checked- equal and bilateral Secured at: 23 cm Tube secured with: Tape Dental Injury: Teeth and Oropharynx as per pre-operative assessment

## 2024-08-27 NOTE — Plan of Care (Signed)
  Problem: Skin Integrity: Goal: Risk for impaired skin integrity will decrease Outcome: Progressing   Problem: Activity: Goal: Risk for activity intolerance will decrease Outcome: Progressing   Problem: Nutrition: Goal: Adequate nutrition will be maintained Outcome: Progressing   

## 2024-08-28 DIAGNOSIS — M87052 Idiopathic aseptic necrosis of left femur: Secondary | ICD-10-CM | POA: Diagnosis not present

## 2024-08-28 LAB — GLUCOSE, CAPILLARY
Glucose-Capillary: 158 mg/dL — ABNORMAL HIGH (ref 70–99)
Glucose-Capillary: 215 mg/dL — ABNORMAL HIGH (ref 70–99)
Glucose-Capillary: 249 mg/dL — ABNORMAL HIGH (ref 70–99)
Glucose-Capillary: 190 mg/dL — ABNORMAL HIGH (ref 70–99)

## 2024-08-28 NOTE — Plan of Care (Signed)
   Problem: Education: Goal: Ability to describe self-care measures that may prevent or decrease complications (Diabetes Survival Skills Education) will improve Outcome: Progressing Goal: Individualized Educational Video(s) Outcome: Progressing   Problem: Coping: Goal: Ability to adjust to condition or change in health will improve Outcome: Progressing   Problem: Fluid Volume: Goal: Ability to maintain a balanced intake and output will improve Outcome: Progressing   Problem: Health Behavior/Discharge Planning: Goal: Ability to identify and utilize available resources and services will improve Outcome: Progressing Goal: Ability to manage health-related needs will improve Outcome: Progressing   Problem: Metabolic: Goal: Ability to maintain appropriate glucose levels will improve Outcome: Progressing   Problem: Nutritional: Goal: Maintenance of adequate nutrition will improve Outcome: Progressing Goal: Progress toward achieving an optimal weight will improve Outcome: Progressing   Problem: Skin Integrity: Goal: Risk for impaired skin integrity will decrease Outcome: Progressing   Problem: Tissue Perfusion: Goal: Adequacy of tissue perfusion will improve Outcome: Progressing   Problem: Education: Goal: Knowledge of General Education information will improve Description: Including pain rating scale, medication(s)/side effects and non-pharmacologic comfort measures Outcome: Progressing   Problem: Health Behavior/Discharge Planning: Goal: Ability to manage health-related needs will improve Outcome: Progressing   Problem: Clinical Measurements: Goal: Ability to maintain clinical measurements within normal limits will improve Outcome: Progressing Goal: Will remain free from infection Outcome: Progressing Goal: Diagnostic test results will improve Outcome: Progressing Goal: Respiratory complications will improve Outcome: Progressing Goal: Cardiovascular complication will  be avoided Outcome: Progressing   Problem: Activity: Goal: Risk for activity intolerance will decrease Outcome: Progressing   Problem: Nutrition: Goal: Adequate nutrition will be maintained Outcome: Progressing   Problem: Coping: Goal: Level of anxiety will decrease Outcome: Progressing   Problem: Elimination: Goal: Will not experience complications related to bowel motility Outcome: Progressing Goal: Will not experience complications related to urinary retention Outcome: Progressing   Problem: Pain Managment: Goal: General experience of comfort will improve and/or be controlled Outcome: Progressing   Problem: Safety: Goal: Ability to remain free from injury will improve Outcome: Progressing   Problem: Skin Integrity: Goal: Risk for impaired skin integrity will decrease Outcome: Progressing   Problem: Education: Goal: Knowledge of the prescribed therapeutic regimen will improve Outcome: Progressing Goal: Understanding of discharge needs will improve Outcome: Progressing Goal: Individualized Educational Video(s) Outcome: Progressing   Problem: Activity: Goal: Ability to avoid complications of mobility impairment will improve Outcome: Progressing Goal: Ability to tolerate increased activity will improve Outcome: Progressing   Problem: Clinical Measurements: Goal: Postoperative complications will be avoided or minimized Outcome: Progressing   Problem: Pain Management: Goal: Pain level will decrease with appropriate interventions Outcome: Progressing   Problem: Skin Integrity: Goal: Will show signs of wound healing Outcome: Progressing

## 2024-08-28 NOTE — Plan of Care (Signed)
   Problem: Coping: Goal: Ability to adjust to condition or change in health will improve Outcome: Progressing   Problem: Metabolic: Goal: Ability to maintain appropriate glucose levels will improve Outcome: Progressing   Problem: Nutritional: Goal: Maintenance of adequate nutrition will improve Outcome: Progressing

## 2024-08-28 NOTE — Progress Notes (Signed)
 PROGRESS NOTE    Gary Wright  FMW:[id number] DOB: 1946/09/29 DOA: 08/18/2024 PCP: Lucius Krabbe, NP   Brief Narrative: Gary Wright is a 78 y.o. male with a history of COPD, asthma, CAD, hyperlipidemia, hypertension, stress-induced cardiomyopathy, chronic avascular necrosis of left hip.  Patient presented secondary to worsening left hip pain in setting of known vascular necrosis.  MRI findings significant for findings most consistent with rapidly progressive femoral head lysis osteonecrosis.  Orthopedic surgery was consulted and performed a total hip arthroplasty on 11/7.   Assessment and Plan:  Acute on chronic left hip pain Osteonecrosis of the left hip Orthopedic surgery was consulted and performed a total hip arthroplasty on 11/7. - Orthopedic surgery recommendations: WBAT, PT - PT/OT recommendations: pending  COPD Stable.  No wheezing. - Continue Breztri and DuoNebs  Primary pretension - Continue irbesartan and metoprolol  succinate  Elevated creatinine Mild.  Baseline creatinine of about 1. Creatinine peak of 1.26, not meeting criteria for AKI.  Leukocytosis Mild.  Resolved.  Chronic diastolic heart failure Stable.  Lasix  held secondary to AKI on admission.  Constipation Resolved.   DVT prophylaxis: Subcutaneous heparin  Code Status:   Code Status: Full Code Family Communication: None at bedside Disposition Plan: Discharge pending orthopedic surgery recommendations/management   Consultants:  Orthopedic surgery  Procedures:  Total hip replacement; Left hip   Antimicrobials: None    Subjective: No issues this morning. His leg feels fine.  Objective: BP 121/83 (BP Location: Right Arm)   Pulse 100   Temp 98.5 F (36.9 C) (Oral)   Resp 18   Ht 5' 8 (1.727 m)   Wt 82.1 kg   SpO2 100%   BMI 27.52 kg/m   Examination:  General exam: Appears calm and comfortable. Respiratory system: Clear to auscultation. Respiratory effort  normal. Cardiovascular system: S1 & S2 heard, RRR. Gastrointestinal system: Abdomen is nondistended, soft and nontender. Normal bowel sounds heard. Central nervous system: Alert and oriented. No focal neurological deficits. Musculoskeletal: No edema. No calf tenderness Psychiatry: Judgement and insight appear normal. Mood & affect appropriate.    Data Reviewed: I have personally reviewed following labs and imaging studies  CBC Lab Results  Component Value Date   WBC 7.9 08/26/2024   RBC 3.78 (L) 08/26/2024   HGB 12.0 (L) 08/26/2024   HCT 36.9 (L) 08/26/2024   MCV 97.6 08/26/2024   MCH 31.7 08/26/2024   PLT 216 08/26/2024   MCHC 32.5 08/26/2024   RDW 13.3 08/26/2024   LYMPHSABS 1.4 08/24/2024   MONOABS 0.9 08/24/2024   EOSABS 0.2 08/24/2024   BASOSABS 0.1 08/24/2024     Last metabolic panel Lab Results  Component Value Date   NA 136 08/27/2024   K 4.4 08/27/2024   CL 101 08/27/2024   CO2 25 08/27/2024   BUN 11 08/27/2024   CREATININE 1.09 08/27/2024   GLUCOSE 108 (H) 08/27/2024   GFRNONAA >60 08/27/2024   GFRAA 57 (L) 12/14/2019   CALCIUM  8.4 (L) 08/27/2024   PHOS 3.5 03/27/2022   PROT 6.0 (L) 08/15/2024   ALBUMIN 3.4 (L) 08/15/2024   LABGLOB 1.8 01/18/2021   AGRATIO 2.6 (H) 01/18/2021   BILITOT 0.7 08/15/2024   ALKPHOS 78 08/15/2024   AST 18 08/15/2024   ALT 27 08/15/2024   ANIONGAP 10 08/27/2024    GFR: Estimated Creatinine Clearance: 58.4 mL/min (by C-G formula based on SCr of 1.09 mg/dL).  Recent Results (from the past 240 hours)  Body fluid culture w Gram Stain  Status: None   Collection Time: 08/19/24 10:21 AM   Specimen: Body Fluid  Result Value Ref Range Status   Specimen Description FLUID HIP  Final   Special Requests Normal  Final   Gram Stain NO WBC SEEN NO ORGANISMS SEEN   Final   Culture   Final    NO GROWTH 3 DAYS Performed at [facility], [address] [place], [place] [address]    Report Status 08/22/2024 FINAL  Final   Surgical pcr screen     Status: None   Collection Time: 08/26/24  5:06 PM   Specimen: Nasal Mucosa; Nasal Swab  Result Value Ref Range Status   MRSA, PCR NEGATIVE NEGATIVE Final   Staphylococcus aureus NEGATIVE NEGATIVE Final    Comment: (NOTE) The Xpert SA Assay (FDA approved for NASAL specimens in patients 45 years of age and older), is one component of a comprehensive surveillance program. It is not intended to diagnose infection nor to guide or monitor treatment. Performed at [facility], [address], [place], [place] [address]       Radiology Studies: DG Pelvis Portable Result Date: 08/27/2024 CLINICAL DATA:  Postop EXAM: PORTABLE PELVIS 1-2 VIEWS COMPARISON:  08/18/2024 FINDINGS: Status post left hip replacement with intact hardware and normal alignment. Gas in the soft tissues consistent with recent surgery. Pubic symphysis and rami are intact. Advanced right hip degenerative change with bone on bone contact and subarticular sclerosis. IMPRESSION: Status post left hip replacement with expected postsurgical change. Electronically Signed   By: Luke Bun M.D.   On: 08/27/2024 23:09   DG HIP UNILAT WITH PELVIS 2-3 VIEWS LEFT Result Date: 08/27/2024 CLINICAL DATA:  Hip surgery EXAM: DG HIP (WITH OR WITHOUT PELVIS) 2-3V*L* COMPARISON:  08/18/2024 FINDINGS: Multiple low resolution intraoperative spot views of the left hip. Total fluoroscopy time was 19.1 seconds, fluoroscopic dose of 2.45 mGy. Images were obtained during operative left hip replacement IMPRESSION: Intraoperative fluoroscopic assistance provided during left hip replacement. Electronically Signed   By: Luke Bun M.D.   On: 08/27/2024 23:09   DG C-Arm 1-60 Min-No Report Result Date: 08/27/2024 Fluoroscopy was utilized by the requesting physician.  No radiographic interpretation.      LOS: 9 days    Elgin Lam, MD [facility] 08/28/2024, 12:15 PM   If 7PM-7AM, please contact  night-coverage [email or web address]

## 2024-08-28 NOTE — Anesthesia Postprocedure Evaluation (Signed)
 Anesthesia Post Note  Patient: Gary Wright  Procedure(s) Performed: ARTHROPLASTY, HIP, TOTAL, ANTERIOR APPROACH (Left: Hip)     Patient location during evaluation: PACU Anesthesia Type: General Level of consciousness: awake Pain management: pain level controlled Vital Signs Assessment: post-procedure vital signs reviewed and stable Respiratory status: spontaneous breathing, nonlabored ventilation and respiratory function stable Cardiovascular status: blood pressure returned to baseline and stable Postop Assessment: no apparent nausea or vomiting Anesthetic complications: no   No notable events documented.  Last Vitals:                  Delon Aisha Arch

## 2024-08-28 NOTE — Progress Notes (Addendum)
   Lott Seelbach - 78 y.o. male MRN [id number]  Date of birth: 1946-08-03    Subjective:  78 year old male postop day 1 left THA for severe AVN.  Doing well, pain is controlled.  Resting comfortably.  Objective:   VITALS:   Vitals:   08/27/24 2034 08/27/24 2348 08/28/24 0438 08/28/24 0812  BP:  129/79 126/86 121/83  Pulse:  88 100 100  Resp:  17 17 18   Temp:  98.9 F (37.2 C) 98.4 F (36.9 C) 98.5 F (36.9 C)  TempSrc:  Oral Oral Oral  SpO2: 100% 97%  100%  Weight:      Height:        Gen: Awake and alert Left lower extremity dressing in place, remains clean dry and intact, ankle plantarflexion/dorsiflexion intact, sensation intact distally, leg warm well-perfused   Lab Results  Component Value Date   WBC 7.9 08/26/2024   HGB 12.0 (L) 08/26/2024   HCT 36.9 (L) 08/26/2024   MCV 97.6 08/26/2024   PLT 216 08/26/2024     Assessment/Plan:  1 Day Post-Op left total hip arthroplasty for severe AVN - Doing well overall, pain is controlled - Weightbearing as tolerated with PT today - DVT prophylaxis   GILDARDO ALDERTON, MD [place], [facility] 08/28/2024,

## 2024-08-28 NOTE — Evaluation (Signed)
 Physical Therapy Re-Evaluation Patient Details Name: Gary Wright MRN: [id number] DOB: [birth year] Today's Date: 08/28/2024  History of Present Illness  78 y.o. male presents to [facility] 08/18/24 with severe L hip pain. 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 Also with L hip joint effusion w/ plan for joint aspiration. Pt s/p L THA 11/7.   PMH positive for stage IV COPD, CAD s/p CABG, HTN, HLD, DM. Recent prior admits due to SOB with hypoxia.   Clinical Impression  Pt underwent hip surgery yesterday. PTA, pt was modI for functional mobility using rollator vs. SPC and independent with ADLs/IADLs. He lives with his wife in a one story house with 4 STE. Pt currently with functional limitations due to the deficits listed below (see PT Problem List). He required modA x2 for supine>sit, minA for sit<>stand using RW, and minA x2 for gait using RW. Pt was able to walk a maximum of ~84ft within the room. Distance limited d/t SOB with pt yelling I can't breath although SpO2 >90% on RA. Pt will benefit from acute skilled PT to increase their independence and safety with mobility to allow discharge. Recommend continued inpatient follow up therapy, <3 hours/day.     If plan is discharge home, recommend the following: A lot of help with walking and/or transfers;A lot of help with bathing/dressing/bathroom;Assistance with cooking/housework;Assist for transportation;Help with stairs or ramp for entrance   Can travel by private vehicle   No    Equipment Recommendations Wheelchair (measurements PT);Wheelchair cushion (measurements PT);BSC/3in1  Recommendations for Other Services       Functional Status Assessment Patient has had a recent decline in their functional status and demonstrates the ability to make significant improvements in function in a reasonable and predictable amount of time.     Precautions / Restrictions  Precautions Precautions: Fall Recall of Precautions/Restrictions: Intact Precaution/Restrictions Comments: Direct anterior approach, no hip precautions Restrictions Weight Bearing Restrictions Per Provider Order: Yes LLE Weight Bearing Per Provider Order: Weight bearing as tolerated      Mobility  Bed Mobility Overal bed mobility: Needs Assistance Bed Mobility: Supine to Sit     Supine to sit: HOB elevated, Used rails, Mod assist, +2 for physical assistance     General bed mobility comments: Pt sat up on L side of bed with increased time. Assist to manage LLE with pt moving BLE together very slowly and minimal distance locked in extension. ModA x2 to elevate trunk and scoot hips fwd.    Transfers Overall transfer level: Needs assistance Equipment used: Rolling walker (2 wheels) Transfers: Sit to/from Stand Sit to Stand: Min assist           General transfer comment: Pt stood from lowest bed height and recliner chair. Cues for proper hand placement using RW. Powered up with minA. Poor to fair eccentric control, plopping down initially. Educated pt to reach back and ease himself down.    Ambulation/Gait Ambulation/Gait assistance: Min assist, +2 safety/equipment (Chair Follow) Gait Distance (Feet): 5 Feet (1x5, seated rest, 1x3) Assistive device: Rolling walker (2 wheels) Gait Pattern/deviations: Step-to pattern, Decreased step length - right, Decreased step length - left, Decreased stance time - left, Decreased weight shift to left Gait velocity: decreased     General Gait Details: Pt took short slow steps with heavy reliance on BUE support on RW. He self-limited weight bearing on LLE d/t pain. Distance limited d/t pt's c/o SOB.  Stairs  Wheelchair Mobility     Tilt Bed    Modified Rankin (Stroke Patients Only)       Balance Overall balance assessment: Needs assistance, History of Falls Sitting-balance support: Feet supported, Bilateral upper  extremity supported Sitting balance-Leahy Scale: Good     Standing balance support: Bilateral upper extremity supported, During functional activity Standing balance-Leahy Scale: Poor Standing balance comment: Dependent on RW                             Pertinent Vitals/Pain Pain Assessment Pain Assessment: 0-10 Pain Score: 8  Pain Location: L hip Pain Descriptors / Indicators: Operative site guarding, Grimacing, Discomfort, Aching Pain Intervention(s): Monitored during session, RN gave pain meds during session, Limited activity within patient's tolerance, Repositioned    Home Living Family/patient expects to be discharged to:: Private residence Living Arrangements: Spouse/significant other Available Help at Discharge: Family;Available 24 hours/day Type of Home: House Home Access: Stairs to enter Entrance Stairs-Rails: Right Entrance Stairs-Number of Steps: 4   Home Layout: One level Home Equipment: Rollator (4 wheels);Cane - single point;Shower seat      Prior Function Prior Level of Function : Independent/Modified Independent             Mobility Comments: Uses rollator vs SPC. Unable to walk in the past few days due to L hip pain. One recent fall with increased difficulty getting up from the ground ADLs Comments: Pt's wife completion of ADLs     Extremity/Trunk Assessment   Upper Extremity Assessment Upper Extremity Assessment: Generalized weakness    Lower Extremity Assessment Lower Extremity Assessment: Defer to PT evaluation LLE Deficits / Details: Pt POD 1 s/p THA. Decreased hip and knee AROM d/t pain. Pt resisted PROM attemps. Grossly 2/5 strength. LLE: Unable to fully assess due to pain LLE Sensation: WNL    Cervical / Trunk Assessment Cervical / Trunk Assessment: Kyphotic  Communication   Communication Communication: No apparent difficulties    Cognition Arousal: Alert Behavior During Therapy: WFL for tasks assessed/performed   PT -  Cognitive impairments: No apparent impairments                       PT - Cognition Comments: Pt A,Ox4. Following commands: Intact       Cueing Cueing Techniques: Verbal cues     General Comments General comments (skin integrity, edema, etc.): Pt started to educate on UE activity    Exercises     Assessment/Plan    PT Assessment Patient needs continued PT services  PT Problem List Decreased strength;Decreased range of motion;Decreased balance;Decreased activity tolerance;Decreased mobility;Pain       PT Treatment Interventions DME instruction;Gait training;Stair training;Functional mobility training;Therapeutic activities;Therapeutic exercise;Balance training;Neuromuscular re-education;Patient/family education    PT Goals (Current goals can be found in the Care Plan section)  Acute Rehab PT Goals Patient Stated Goal: Have less pain and move better PT Goal Formulation: With patient Time For Goal Achievement: 09/11/24 Potential to Achieve Goals: Good    Frequency Min 2X/week     Co-evaluation               AM-PAC PT 6 Clicks Mobility  Outcome Measure Help needed turning from your back to your side while in a flat bed without using bedrails?: Total Help needed moving from lying on your back to sitting on the side of a flat bed without using bedrails?: Total Help needed moving to and from a  bed to a chair (including a wheelchair)?: A Little Help needed standing up from a chair using your arms (e.g., wheelchair or bedside chair)?: A Little Help needed to walk in hospital room?: Total Help needed climbing 3-5 steps with a railing? : Total 6 Click Score: 10    End of Session Equipment Utilized During Treatment: Gait belt Activity Tolerance: Patient tolerated treatment well;Patient limited by pain;Patient limited by fatigue Patient left: in chair;with call bell/phone within reach;with chair alarm set Nurse Communication: Mobility status PT Visit  Diagnosis: Other abnormalities of gait and mobility (R26.89);History of falling (Z91.81);Pain Pain - Right/Left: Left Pain - part of body: Hip    Time: 8966-8943 PT Time Calculation (min) (ACUTE ONLY): 23 min   Charges:   PT Evaluation $PT Re-evaluation: 1 Re-eval   PT General Charges $$ ACUTE PT VISIT: 1 Visit         Randall SAUNDERS, PT, DPT Acute Rehabilitation Services Office: [phone] Secure Chat Preferred  Delon CHRISTELLA Callander 08/28/2024, 1:05 PM

## 2024-08-28 NOTE — Evaluation (Signed)
 Occupational Therapy RE-Evaluation Patient Details Name: Gary Wright MRN: [id number] DOB: October 09, 1946 Today's Date: 08/28/2024   History of Present Illness   78 y.o. male presents to [facility] 08/18/24 with severe L hip pain. 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 Also with L hip joint effusion w/ plan for joint aspiration. Pt s/p L THA 11/7.   PMH positive for stage IV COPD, CAD s/p CABG, HTN, HLD, DM. Recent prior admits due to SOB with hypoxia.     Clinical Impressions Pt is eager to participate in therapy at this time. He was able to complete sit to stand transfer with CGA-min assist but started to report feeling dizzy and required to sit back down (nurse made aware). Pt at this time was educated about light UE AROM they can complete at this time if not feeling dizzy or SOB. Spoke to pt about SNF vs HH at this time. Patient will benefit from continued inpatient follow up therapy, <3 hours/day.   BP: Post standing: 85/59 (69) o2 98% Sitting with BLE elevated: 95/57 (70) 98% o2       If plan is discharge home, recommend the following:   A lot of help with bathing/dressing/bathroom;Assistance with cooking/housework;Assist for transportation;Help with stairs or ramp for entrance;A little help with walking and/or transfers     Functional Status Assessment   Patient has had a recent decline in their functional status and demonstrates the ability to make significant improvements in function in a reasonable and predictable amount of time.     Equipment Recommendations   Other (comment) (TBD at next venue)     Recommendations for Other Services         Precautions/Restrictions   Precautions Precautions: Fall Recall of Precautions/Restrictions: Intact Precaution/Restrictions Comments: Direct anterior approach, no hip precautions Restrictions Weight Bearing Restrictions Per Provider Order: Yes LLE  Weight Bearing Per Provider Order: Weight bearing as tolerated     Mobility Bed Mobility               General bed mobility comments: Pt sitting in chair    Transfers Overall transfer level: Needs assistance Equipment used: Rolling walker (2 wheels) Transfers: Sit to/from Stand Sit to Stand: Contact guard assist, Min assist           General transfer comment: Pt needed mod cues on hand placement, he became shaky in session and then strated report feeling dizzy      Balance Overall balance assessment: Needs assistance Sitting-balance support: Feet supported Sitting balance-Leahy Scale: Good     Standing balance support: Bilateral upper extremity supported Standing balance-Leahy Scale: Poor Standing balance comment: reliant on RW support                           ADL either performed or assessed with clinical judgement   ADL Overall ADL's : Needs assistance/impaired Eating/Feeding: Independent;Sitting   Grooming: Wash/dry hands;Wash/dry face;Set up;Sitting   Upper Body Bathing: Set up;Sitting   Lower Body Bathing: Maximal assistance;Sit to/from stand;Total assistance   Upper Body Dressing : Set up;Sitting   Lower Body Dressing: Total assistance                       Vision Baseline Vision/History: 0 No visual deficits Ability to See in Adequate Light: 0 Adequate Patient Visual Report: No change from baseline Vision Assessment?: No apparent visual deficits     Perception  Perception: Within Functional Limits       Praxis Praxis: WFL       Pertinent Vitals/Pain Pain Assessment Pain Assessment: No/denies pain Pain Score: 0-No pain Pain Intervention(s): Monitored during session     Extremity/Trunk Assessment Upper Extremity Assessment Upper Extremity Assessment: Generalized weakness   Lower Extremity Assessment Lower Extremity Assessment: Defer to PT evaluation LLE Deficits / Details: Pt POD 1 s/p THA. Decreased hip and  knee AROM d/t pain. Pt resisted PROM attemps. Grossly 2/5 strength. LLE: Unable to fully assess due to pain LLE Sensation: WNL   Cervical / Trunk Assessment Cervical / Trunk Assessment: Kyphotic   Communication Communication Communication: No apparent difficulties   Cognition Arousal: Alert Behavior During Therapy: WFL for tasks assessed/performed Cognition: No apparent impairments                               Following commands: Intact       Cueing  General Comments   Cueing Techniques: Verbal cues  Pt started to educate on UE activity   Exercises     Shoulder Instructions      Home Living Family/patient expects to be discharged to:: Private residence Living Arrangements: Spouse/significant other Available Help at Discharge: Family;Available 24 hours/day Type of Home: House Home Access: Stairs to enter [employer] of Steps: 4 Entrance Stairs-Rails: Right Home Layout: One level     Bathroom Shower/Tub: Chief Strategy Officer: Standard Bathroom Accessibility: Yes   Home Equipment: Rollator (4 wheels);Cane - single point;Shower seat          Prior Functioning/Environment Prior Level of Function : Independent/Modified Independent             Mobility Comments: Uses rollator vs SPC. Unable to walk in the past few days due to L hip pain. One recent fall with increased difficulty getting up from the ground ADLs Comments: Pt's wife completion of ADLs    OT Problem List: Decreased strength;Impaired balance (sitting and/or standing);Decreased knowledge of use of DME or AE;Pain   OT Treatment/Interventions: Self-care/ADL training;DME and/or AE instruction;Therapeutic activities;Patient/family education;Balance training      OT Goals(Current goals can be found in the care plan section)   Acute Rehab OT Goals Patient Stated Goal: to go home if possible OT Goal Formulation: With patient Time For Goal Achievement:  09/11/24 Potential to Achieve Goals: Fair   OT Frequency:  Min 2X/week    Co-evaluation              AM-PAC OT 6 Clicks Daily Activity     Outcome Measure Help from another person eating meals?: None Help from another person taking care of personal grooming?: A Little Help from another person toileting, which includes using toliet, bedpan, or urinal?: A Lot Help from another person bathing (including washing, rinsing, drying)?: A Lot Help from another person to put on and taking off regular upper body clothing?: A Little Help from another person to put on and taking off regular lower body clothing?: A Lot 6 Click Score: 16   End of Session Equipment Utilized During Treatment: Gait belt;Rolling walker (2 wheels)  Activity Tolerance: Other (comment) (bp) Patient left: in chair;with call bell/phone within reach;with chair alarm set  OT Visit Diagnosis: Pain;History of falling (Z91.81) Pain - Right/Left: Left Pain - part of body: Hip                Time: 1212-1239 OT Time Calculation (  min): 27 min Charges:  OT General Charges $OT Visit: 1 Visit OT Evaluation $OT Re-eval: 1 Re-eval OT Treatments $Self Care/Home Management : 8-22 mins  Warrick POUR OTR/L  Acute Rehab Services  [phone] office number   Warrick Berber 08/28/2024, 12:47 PM

## 2024-08-29 DIAGNOSIS — I10 Essential (primary) hypertension: Secondary | ICD-10-CM | POA: Diagnosis not present

## 2024-08-29 DIAGNOSIS — M87052 Idiopathic aseptic necrosis of left femur: Secondary | ICD-10-CM | POA: Diagnosis not present

## 2024-08-29 LAB — GLUCOSE, CAPILLARY
Glucose-Capillary: 128 mg/dL — ABNORMAL HIGH (ref 70–99)
Glucose-Capillary: 172 mg/dL — ABNORMAL HIGH (ref 70–99)
Glucose-Capillary: 119 mg/dL — ABNORMAL HIGH (ref 70–99)
Glucose-Capillary: 138 mg/dL — ABNORMAL HIGH (ref 70–99)
Glucose-Capillary: 159 mg/dL — ABNORMAL HIGH (ref 70–99)

## 2024-08-29 LAB — BASIC METABOLIC PANEL WITH GFR
Anion gap: 11 (ref 5–15)
BUN: 17 mg/dL (ref 8–23)
CO2: 24 mmol/L (ref 22–32)
Calcium: 8.3 mg/dL — ABNORMAL LOW (ref 8.9–10.3)
Chloride: 101 mmol/L (ref 98–111)
Creatinine, Ser: 1.17 mg/dL (ref 0.61–1.24)
GFR, Estimated: >60 mL/min (ref >60)
Glucose, Bld: 153 mg/dL — ABNORMAL HIGH (ref 70–99)
Potassium: 4 mmol/L (ref 3.5–5.1)
Sodium: 136 mmol/L (ref 135–145)

## 2024-08-29 LAB — CBC
HCT: 28 % — ABNORMAL LOW (ref 39.0–52.0)
Hemoglobin: 9.5 g/dL — ABNORMAL LOW (ref 13.0–17.0)
MCH: 32 pg (ref 26.0–34.0)
MCHC: 33.9 g/dL (ref 30.0–36.0)
MCV: 94.3 fL (ref 80.0–100.0)
Platelets: 270 K/uL (ref 150–400)
RBC: 2.97 MIL/uL — ABNORMAL LOW (ref 4.22–5.81)
RDW: 13.4 % (ref 11.5–15.5)
WBC: 13.2 K/uL — ABNORMAL HIGH (ref 4.0–10.5)
nRBC: 0 % (ref 0.0–0.2)

## 2024-08-29 MED ORDER — DUPILUMAB 300 MG/2ML ~~LOC~~ SOAJ
300.0000 mg | SUBCUTANEOUS | Status: DC
  Administered 2024-08-30: 300 mg via SUBCUTANEOUS
  Filled 2024-08-29 (×3): qty 2

## 2024-08-29 NOTE — Progress Notes (Signed)
   Gary Wright - 78 y.o. male MRN [id number]  Date of birth: 07-Jan-1946    Subjective:  78 year old male postop day 2 left THA for severe AVN.  Doing well, pain is controlled.  Resting comfortably.  Objective:   VITALS:   Vitals:   08/28/24 1800 08/28/24 1934 08/28/24 2106 08/29/24 0423  BP: (!) 114/57 (!) 107/54  (!) 158/84  Pulse: (!) 105 (!) 105  (!) 102  Resp: 18 19  16   Temp: 98.6 F (37 C) 98.9 F (37.2 C)  97.6 F (36.4 C)  TempSrc: Oral     SpO2: 98% 96% 96% 100%  Weight:      Height:        Gen: Awake and alert Left lower extremity dressing in place, remains clean dry and intact, ankle plantarflexion/dorsiflexion intact, sensation intact distally, leg warm well-perfused   Lab Results  Component Value Date   WBC 13.2 (H) 08/29/2024   HGB 9.5 (L) 08/29/2024   HCT 28.0 (L) 08/29/2024   MCV 94.3 08/29/2024   PLT 270 08/29/2024     Assessment/Plan:  2 Days Post-Op left total hip arthroplasty for severe AVN - Doing well overall, pain is controlled - Weightbearing as tolerated with PT today - DVT prophylaxis   Tinsleigh Slovacek, MD [place], [facility] 08/29/2024,

## 2024-08-29 NOTE — Progress Notes (Signed)
 PROGRESS NOTE    Gary Wright  FMW:[id number] DOB: November 10, 1945 DOA: 08/18/2024 PCP: Lucius Krabbe, NP   Brief Narrative:  Gary Wright is a 78 y.o. male with a history of COPD, asthma, CAD, hyperlipidemia, hypertension, stress-induced cardiomyopathy, chronic avascular necrosis of left hip.  Patient presented secondary to worsening left hip pain in setting of known vascular necrosis.  MRI findings significant for findings most consistent with rapidly progressive femoral head lysis osteonecrosis.  Orthopedic surgery was consulted and performed a total hip arthroplasty on 11/7.  He is postoperative day 2 for severe AVN and doing fairly seems to complain of some pain.  Orthopedic surgery recommending weightbearing as tolerated and PT/OT recommending SNF for discharge  Assessment and Plan:  Acute on chronic left hip pain with Osteonecrosis of the left hip status post THA:  Orthopedic surgery was consulted and performed a total hip arthroplasty on 11/7. -Orthopedic surgery recommendations: WBAT, PT -PT/OT recommendations: Skilled nursing facility -Pain Control w/ po Acetaminophen  325-650 po q6hprn Mild Pain, Hydrocodone-Acetaminophen  5-325 mg per tab 1-2 po q8hprn Moderate Pain, Hydrocodone-Acetaminophen  7.5-325 mg per tab 1-2 po q8hprn Severe Pain and IV Morphine  0.5-1 mg IV q6hprn Severe Pain -C/w Supportive Care and Antiemetics w/ po/IV Ondansetron  4 mg q6hprn Nausea and po/IV Metaclopramide 5-10 mg q8hprn Nausea if Ondansetron  ineffective -VTE Prophylaxis per Orthopedic Surgery and they are recommending C/w ASA 81 mg po BID   COPD: Stable.  No wheezing. Continue Breztri 2 puff IH BID and DuoNebs 3 mL q4hprn Wheezing and SOB. C/w Dupilumab  300 mg sq q14 Days   Essential Hypertension: Continue Irbesartan 75 mg po dailyand Metoprolol  Succinate 12.5 mg po Daily. CTM BP per Protocol. Last BP reading was 99/56   Elevated Creatinine / Renal Insufficiency: Mild.  Baseline  creatinine of about 1. Creatinine peak of 1.26, not meeting criteria for AKI. -BUN/Cr Trend: Recent Labs  Lab 08/18/24 1432 08/19/24 0313 08/24/24 0517 08/25/24 0916 08/26/24 0345 08/27/24 0359 08/29/24 0514  BUN 22 19 23 15 18 11 17   CREATININE 1.15 1.05 1.26* 0.92 1.20 1.09 1.17  -Discontinue IVF  -Avoid Nephrotoxic Medications, Contrast Dyes, Hypotension and Dehydration to Ensure Adequate Renal Perfusion and will need to Renally Adjust Meds. CTM & Trend Renal Function carefully. Repeat CMP in the AM   HLD: C/w Atorvastatin  40 mg po qHS  Leukocytosis: Mild and likely elevated in the setting of surgical intervention. WBC Trend:  Recent Labs  Lab 08/06/24 0135 08/15/24 1417 08/18/24 1432 08/19/24 0313 08/24/24 0517 08/26/24 0345 08/29/24 0514  WBC 13.1* 13.7* 11.1* 11.4* 8.8 7.9 13.2*  - Continue to monitor for signs and symptoms infection; repeat CBC in a.m.  Chronic Diastolic Heart Failure: Stable.  Lasix  held secondary to AKI on admission.  Continue to monitor for signs and symptoms of volume overload   Constipation: Resolved. Discontinue Doucsate 100 mg po BID; C/w Miralax 17 grams po Daily and Senna-Docusate 2 tab po BID and C/w Sorbitol 30 mL po Dailyprn Moderate Constipation and also has Magnesium  Citrate 1 bottle po Once PRN Sever Constipation   Depression and Anxiety: C/w Buproprion 300 mg po Daily and Citalopram  40 mg po Daily   Normocytic Anemia: Hgb/Hct Trend:  Recent Labs  Lab 08/06/24 0135 08/15/24 1417 08/18/24 1432 08/19/24 0313 08/24/24 0517 08/26/24 0345 08/29/24 0514  HGB 11.5* 13.5 12.7* 13.6 12.5* 12.0* 9.5*  HCT 34.5* 41.3 38.3* 41.0 38.2* 36.9* 28.0*  MCV 95.0 96.5 95.3 97.4 96.0 97.6 94.3  -Check Anemia Panel in the AM.  CTM for S/Sx of Bleeding; No overt bleeding noted. Repeat CBC in the AM   GERD/GI Prophylaxis: C/w Pantoprazole  40 mg po Daily and Dailyprn Heartburn and Maalox/Mylanta 30 mL po q4hprn Indigestion   Hypoalbuminemia:  Patient's Albumin Lvl Trend went from 3.2 -> 2.6 -> 3.4. CTM & Trend & repeat CMP in the AM  Overweight: Complicates overall prognosis and care. Estimated body mass index is 27.52 kg/m as calculated from the following:   Height as of this encounter: 5' 8 (1.727 m).   Weight as of this encounter: 82.1 kg. Weight Loss and Dietary Counseling given   DVT prophylaxis: SCDs Start: 08/27/24 2034    Code Status: Full Code Family Communication: No family present @ bedside  Disposition Plan:  Level of care: Telemetry Status is: Inpatient Remains inpatient appropriate because: Needs clearance by Orthopedic Surgery and SNF availability    Consultants:  Orthopedic Surgery   Procedures:  As delineated as above  Antimicrobials:  Anti-infectives (From admission, onward)    Start     Dose/Rate Route Frequency Ordered Stop   08/27/24 2200  ceFAZolin  (ANCEF ) IVPB 2g/100 mL premix        2 g 200 mL/hr over 30 Minutes Intravenous Every 6 hours 08/27/24 2034 08/28/24 0557   08/27/24 1642  vancomycin (VANCOCIN) powder  Status:  Discontinued          As needed 08/27/24 1642 08/27/24 1755   08/27/24 0600  ceFAZolin  (ANCEF ) IVPB 2g/100 mL premix        2 g 200 mL/hr over 30 Minutes Intravenous On call to O.R. 08/27/24 0015 08/27/24 1620       Subjective: Seen and examined at bedside and he was complaining of pain this morning but thinks is tolerable.  No nausea or vomiting.  Denied lightheadedness or dizziness.  Concerned about receiving his Dupixent  tomorrow.  No other concerns or complaints at this time  Objective: Vitals:   08/28/24 2106 08/29/24 0423 08/29/24 0735 08/29/24 1417  BP:  (!) 158/84 119/78 (!) 99/56  Pulse:  (!) 102 (!) 109 87  Resp:  16 16 16   Temp:  97.6 F (36.4 C) 98 F (36.7 C) 98.7 F (37.1 C)  TempSrc:   Oral Oral  SpO2: 96% 100% 100% 98%  Weight:      Height:        Intake/Output Summary (Last 24 hours) at 08/29/2024 1840 Last data filed at 08/29/2024  1345 Gross per 24 hour  Intake --  Output 1701 ml  Net -1701 ml   Filed Weights   08/18/24 1331  Weight: 82.1 kg   Examination: Physical Exam:  Constitutional: WN/WD elderly overweight Caucasian male no acute distress Respiratory: Diminished to auscultation bilaterally with some coarse breath sound, no wheezing, rales, rhonchi or crackles. Normal respiratory effort and patient is not tachypenic. No accessory muscle use.  Unlabored breathing Cardiovascular: RRR, no murmurs / rubs / gallops. S1 and S2 auscultated.  Has mild 1+ extremity edema Abdomen: Soft, non-tender, slightly distended secondary to body habitus. Bowel sounds positive.  GU: Deferred. Musculoskeletal: No clubbing / cyanosis of digits/nails. No joint deformity upper and lower extremities.  Skin: No rashes, lesions, ulcers on a limited skin evaluation. No induration; Warm and dry.  Neurologic: CN 2-12 grossly intact with no focal deficits. Romberg sign and cerebellar reflexes not assessed.  Psychiatric: Normal judgment and insight. Alert and oriented x 3. Normal mood and appropriate affect.   Data Reviewed: I have personally reviewed following labs and imaging  studies  CBC: Recent Labs  Lab 08/24/24 0517 08/26/24 0345 08/29/24 0514  WBC 8.8 7.9 13.2*  NEUTROABS 6.1  --   --   HGB 12.5* 12.0* 9.5*  HCT 38.2* 36.9* 28.0*  MCV 96.0 97.6 94.3  PLT 236 216 270   Basic Metabolic Panel: Recent Labs  Lab 08/24/24 0517 08/25/24 0916 08/26/24 0345 08/27/24 0359 08/29/24 0514  NA 133* 138 138 136 136  K 4.2 3.3* 4.2 4.4 4.0  CL 97* 107 100 101 101  CO2 25 25 26 25 24   GLUCOSE 117* 148* 103* 108* 153*  BUN 23 15 18 11 17   CREATININE 1.26* 0.92 1.20 1.09 1.17  CALCIUM  8.8* 6.9* 8.6* 8.4* 8.3*  MG 1.8  --   --   --   --    GFR: Estimated Creatinine Clearance: 54.4 mL/min (by C-G formula based on SCr of 1.17 mg/dL). Liver Function Tests: No results for input(s): AST, ALT, ALKPHOS, BILITOT, PROT,  ALBUMIN in the last 168 hours. No results for input(s): LIPASE, AMYLASE in the last 168 hours. No results for input(s): AMMONIA in the last 168 hours. Coagulation Profile: No results for input(s): INR, PROTIME in the last 168 hours. Cardiac Enzymes: No results for input(s): CKTOTAL, CKMB, CKMBINDEX, TROPONINI in the last 168 hours. BNP (last 3 results) No results for input(s): PROBNP in the last 8760 hours. HbA1C: No results for input(s): HGBA1C in the last 72 hours. CBG: Recent Labs  Lab 08/28/24 2122 08/29/24 0621 08/29/24 0916 08/29/24 1213 08/29/24 1659  GLUCAP 190* 128* 172* 119* 138*   Lipid Profile: No results for input(s): CHOL, HDL, LDLCALC, TRIG, CHOLHDL, LDLDIRECT in the last 72 hours. Thyroid  Function Tests: No results for input(s): TSH, T4TOTAL, FREET4, T3FREE, THYROIDAB in the last 72 hours. Anemia Panel: No results for input(s): VITAMINB12, FOLATE, FERRITIN, TIBC, IRON, RETICCTPCT in the last 72 hours. Sepsis Labs: No results for input(s): PROCALCITON, LATICACIDVEN in the last 168 hours.  Recent Results (from the past 240 hours)  Surgical pcr screen     Status: None   Collection Time: 08/26/24  5:06 PM   Specimen: Nasal Mucosa; Nasal Swab  Result Value Ref Range Status   MRSA, PCR NEGATIVE NEGATIVE Final   Staphylococcus aureus NEGATIVE NEGATIVE Final    Comment: (NOTE) The Xpert SA Assay (FDA approved for NASAL specimens in patients 78 years of age and older), is one component of a comprehensive surveillance program. It is not intended to diagnose infection nor to guide or monitor treatment. Performed at [facility], [address] [place], [place] [address]     Radiology Studies: No results found.  Scheduled Meds:  aspirin   81 mg Oral BID   atorvastatin   40 mg Oral QHS   budesonide -glycopyrrolate-formoterol  2 puff Inhalation BID   buPROPion   300 mg Oral Daily   citalopram   40  mg Oral Daily   docusate sodium  100 mg Oral BID   [START ON 08/30/2024] Dupilumab   300 mg Subcutaneous Q14 Days   insulin  aspart  0-9 Units Subcutaneous TID WC   irbesartan  75 mg Oral Daily   metoprolol  succinate  12.5 mg Oral Daily   pantoprazole   40 mg Oral Daily   polyethylene glycol  17 g Oral Daily   senna-docusate  2 tablet Oral BID   Continuous Infusions:   LOS: 10 days   Alejandro Marker, DO [facility] Available via Epic secure chat 7am-7pm After these hours, please refer to coverage provider listed  on [email or web address] 08/29/2024, 6:40 PM

## 2024-08-29 NOTE — Progress Notes (Incomplete)
 PROGRESS NOTE    Ahad Colarusso  FMW:[id number] DOB: 08/28/1946 DOA: 08/18/2024 PCP: Lucius Krabbe, NP   Brief Narrative:  Gary Wright is a 78 y.o. male with a history of COPD, asthma, CAD, hyperlipidemia, hypertension, stress-induced cardiomyopathy, chronic avascular necrosis of left hip.  Patient presented secondary to worsening left hip pain in setting of known vascular necrosis.  MRI findings significant for findings most consistent with rapidly progressive femoral head lysis osteonecrosis.  Orthopedic surgery was consulted and performed a total hip arthroplasty on 11/7.  Assessment and Plan:  Acute on chronic left hip pain Osteonecrosis of the left hip Orthopedic surgery was consulted and performed a total hip arthroplasty on 11/7. - Orthopedic surgery recommendations: WBAT, PT - PT/OT recommendations: pending   COPD Stable.  No wheezing. - Continue Breztri and DuoNebs   Essential Hypertension: Continue irbesartan and metoprolol  succinate   Elevated Creatinine / Renal Insufficiency: Mild.  Baseline creatinine of about 1. Creatinine peak of 1.26, not meeting criteria for AKI. -BUN/Cr Trend: Recent Labs  Lab 08/18/24 1432 08/19/24 0313 08/24/24 0517 08/25/24 0916 08/26/24 0345 08/27/24 0359 08/29/24 0514  BUN 22 19 23 15 18 11 17   CREATININE 1.15 1.05 1.26* 0.92 1.20 1.09 1.17  -Avoid Nephrotoxic Medications, Contrast Dyes, Hypotension and Dehydration to Ensure Adequate Renal Perfusion and will need to Renally Adjust Meds. CTM & Trend Renal Function carefully. Repeat CMP in the AM   Leukocytosis: Mild. WBC Trend:  Recent Labs  Lab 08/06/24 0135 08/15/24 1417 08/18/24 1432 08/19/24 0313 08/24/24 0517 08/26/24 0345 08/29/24 0514  WBC 13.1* 13.7* 11.1* 11.4* 8.8 7.9 13.2*     Chronic diastolic heart failure Stable.  Lasix  held secondary to AKI on admission.   Constipation Resolved.  Normocytic Anemia: Hgb/Hct Trend:  Recent Labs  Lab  08/06/24 0135 08/15/24 1417 08/18/24 1432 08/19/24 0313 08/24/24 0517 08/26/24 0345 08/29/24 0514  HGB 11.5* 13.5 12.7* 13.6 12.5* 12.0* 9.5*  HCT 34.5* 41.3 38.3* 41.0 38.2* 36.9* 28.0*  MCV 95.0 96.5 95.3 97.4 96.0 97.6 94.3  -Check Anemia Panel in the AM. CTM for S/Sx of Bleeding; No overt bleeding noted. Repeat CBC in the AM      DVT prophylaxis: SCDs Start: 08/27/24 2034    Code Status: Full Code Family Communication:   Disposition Plan:  Level of care: Telemetry Status is: Inpatient {Inpatient:23812}    Consultants:  ***  Procedures:  ***  Antimicrobials:  Anti-infectives (From admission, onward)    Start     Dose/Rate Route Frequency Ordered Stop   08/27/24 2200  ceFAZolin  (ANCEF ) IVPB 2g/100 mL premix        2 g 200 mL/hr over 30 Minutes Intravenous Every 6 hours 08/27/24 2034 08/28/24 0557   08/27/24 1642  vancomycin (VANCOCIN) powder  Status:  Discontinued          As needed 08/27/24 1642 08/27/24 1755   08/27/24 0600  ceFAZolin  (ANCEF ) IVPB 2g/100 mL premix        2 g 200 mL/hr over 30 Minutes Intravenous On call to O.R. 08/27/24 0015 08/27/24 1620        Subjective: ***  Objective: Vitals:   08/28/24 1934 08/28/24 2106 08/29/24 0423 08/29/24 0735  BP: (!) 107/54  (!) 158/84 119/78  Pulse: (!) 105  (!) 102 (!) 109  Resp: 19  16 16   Temp: 98.9 F (37.2 C)  97.6 F (36.4 C) 98 F (36.7 C)  TempSrc:    Oral  SpO2: 96% 96% 100% 100%  Weight:      Height:        Intake/Output Summary (Last 24 hours) at 08/29/2024 0847 Last data filed at 08/29/2024 9375 Gross per 24 hour  Intake 120 ml  Output 1701 ml  Net -1581 ml   Filed Weights   08/18/24 1331  Weight: 82.1 kg    Examination: Physical Exam:  Constitutional: WN/WD, NAD and appears calm and comfortable Eyes: PERRL, lids and conjunctivae normal, sclerae anicteric  ENMT: External Ears, Nose appear normal. Grossly normal hearing. Mucous membranes are moist. Posterior pharynx clear  of any exudate or lesions. Normal dentition.  Neck: Appears normal, supple, no cervical masses, normal ROM, no appreciable thyromegaly Respiratory: Clear to auscultation bilaterally, no wheezing, rales, rhonchi or crackles. Normal respiratory effort and patient is not tachypenic. No accessory muscle use.  Cardiovascular: RRR, no murmurs / rubs / gallops. S1 and S2 auscultated. No extremity edema. 2+ pedal pulses. No carotid bruits.  Abdomen: Soft, non-tender, non-distended. No masses palpated. No appreciable hepatosplenomegaly. Bowel sounds positive.  GU: Deferred. Musculoskeletal: No clubbing / cyanosis of digits/nails. No joint deformity upper and lower extremities. Good ROM, no contractures. Normal strength and muscle tone.  Skin: No rashes, lesions, ulcers. No induration; Warm and dry.  Neurologic: CN 2-12 grossly intact with no focal deficits. Sensation intact in all 4 Extremities, DTR normal. Strength 5/5 in all 4. Romberg sign cerebellar reflexes not assessed.  Psychiatric: Normal judgment and insight. Alert and oriented x 3. Normal mood and appropriate affect.   Data Reviewed: I have personally reviewed following labs and imaging studies  CBC: Recent Labs  Lab 08/24/24 0517 08/26/24 0345 08/29/24 0514  WBC 8.8 7.9 13.2*  NEUTROABS 6.1  --   --   HGB 12.5* 12.0* 9.5*  HCT 38.2* 36.9* 28.0*  MCV 96.0 97.6 94.3  PLT 236 216 270   Basic Metabolic Panel: Recent Labs  Lab 08/24/24 0517 08/25/24 0916 08/26/24 0345 08/27/24 0359 08/29/24 0514  NA 133* 138 138 136 136  K 4.2 3.3* 4.2 4.4 4.0  CL 97* 107 100 101 101  CO2 25 25 26 25 24   GLUCOSE 117* 148* 103* 108* 153*  BUN 23 15 18 11 17   CREATININE 1.26* 0.92 1.20 1.09 1.17  CALCIUM  8.8* 6.9* 8.6* 8.4* 8.3*  MG 1.8  --   --   --   --    GFR: Estimated Creatinine Clearance: 54.4 mL/min (by C-G formula based on SCr of 1.17 mg/dL). Liver Function Tests: No results for input(s): AST, ALT, ALKPHOS, BILITOT, PROT,  ALBUMIN in the last 168 hours. No results for input(s): LIPASE, AMYLASE in the last 168 hours. No results for input(s): AMMONIA in the last 168 hours. Coagulation Profile: No results for input(s): INR, PROTIME in the last 168 hours. Cardiac Enzymes: No results for input(s): CKTOTAL, CKMB, CKMBINDEX, TROPONINI in the last 168 hours. BNP (last 3 results) No results for input(s): PROBNP in the last 8760 hours. HbA1C: No results for input(s): HGBA1C in the last 72 hours. CBG: Recent Labs  Lab 08/28/24 0644 08/28/24 1134 08/28/24 1609 08/28/24 2122 08/29/24 0621  GLUCAP 158* 215* 249* 190* 128*   Lipid Profile: No results for input(s): CHOL, HDL, LDLCALC, TRIG, CHOLHDL, LDLDIRECT in the last 72 hours. Thyroid  Function Tests: No results for input(s): TSH, T4TOTAL, FREET4, T3FREE, THYROIDAB in the last 72 hours. Anemia Panel: No results for input(s): VITAMINB12, FOLATE, FERRITIN, TIBC, IRON, RETICCTPCT in the last 72 hours. Sepsis Labs: No results for input(s): PROCALCITON, LATICACIDVEN  in the last 168 hours.  Recent Results (from the past 240 hours)  Body fluid culture w Gram Stain     Status: None   Collection Time: 08/19/24 10:21 AM   Specimen: Body Fluid  Result Value Ref Range Status   Specimen Description FLUID HIP  Final   Special Requests Normal  Final   Gram Stain NO WBC SEEN NO ORGANISMS SEEN   Final   Culture   Final    NO GROWTH 3 DAYS Performed at [facility], [address] [place], [place] [address]    Report Status 08/22/2024 FINAL  Final  Surgical pcr screen     Status: None   Collection Time: 08/26/24  5:06 PM   Specimen: Nasal Mucosa; Nasal Swab  Result Value Ref Range Status   MRSA, PCR NEGATIVE NEGATIVE Final   Staphylococcus aureus NEGATIVE NEGATIVE Final    Comment: (NOTE) The Xpert SA Assay (FDA approved for NASAL specimens in patients 46 years of age and older), is one  component of a comprehensive surveillance program. It is not intended to diagnose infection nor to guide or monitor treatment. Performed at [facility], [address], [place], [place] [address]      Radiology Studies: DG Pelvis Portable Result Date: 08/27/2024 CLINICAL DATA:  Postop EXAM: PORTABLE PELVIS 1-2 VIEWS COMPARISON:  08/18/2024 FINDINGS: Status post left hip replacement with intact hardware and normal alignment. Gas in the soft tissues consistent with recent surgery. Pubic symphysis and rami are intact. Advanced right hip degenerative change with bone on bone contact and subarticular sclerosis. IMPRESSION: Status post left hip replacement with expected postsurgical change. Electronically Signed   By: Luke Bun M.D.   On: 08/27/2024 23:09   DG HIP UNILAT WITH PELVIS 2-3 VIEWS LEFT Result Date: 08/27/2024 CLINICAL DATA:  Hip surgery EXAM: DG HIP (WITH OR WITHOUT PELVIS) 2-3V*L* COMPARISON:  08/18/2024 FINDINGS: Multiple low resolution intraoperative spot views of the left hip. Total fluoroscopy time was 19.1 seconds, fluoroscopic dose of 2.45 mGy. Images were obtained during operative left hip replacement IMPRESSION: Intraoperative fluoroscopic assistance provided during left hip replacement. Electronically Signed   By: Luke Bun M.D.   On: 08/27/2024 23:09   DG C-Arm 1-60 Min-No Report Result Date: 08/27/2024 Fluoroscopy was utilized by the requesting physician.  No radiographic interpretation.     Scheduled Meds:  aspirin   81 mg Oral BID   atorvastatin   40 mg Oral QHS   budesonide -glycopyrrolate-formoterol  2 puff Inhalation BID   buPROPion   300 mg Oral Daily   citalopram   40 mg Oral Daily   docusate sodium  100 mg Oral BID   insulin  aspart  0-9 Units Subcutaneous TID WC   irbesartan  75 mg Oral Daily   metoprolol  succinate  12.5 mg Oral Daily   pantoprazole   40 mg Oral Daily   polyethylene glycol  17 g Oral Daily   senna-docusate  2 tablet Oral BID    Continuous Infusions:  sodium chloride  75 mL/hr at 08/26/24 0407   sodium chloride  75 mL/hr at 08/28/24 2055     LOS: 10 days    Time spent: *** minutes spent on chart review, discussion with nursing staff, consultants, updating family and interview/physical exam; more than 50% of that time was spent in counseling and/or coordination of care.   Alejandro Lazarus Marker, DO [facility] Available via Epic secure chat 7am-7pm After these hours, please refer to coverage provider listed on [email or web address] 08/29/2024, 8:47 AM

## 2024-08-29 NOTE — Plan of Care (Signed)
  Problem: Coping: Goal: Ability to adjust to condition or change in health will improve Outcome: Progressing   Problem: Health Behavior/Discharge Planning: Goal: Ability to manage health-related needs will improve Outcome: Progressing   Problem: Nutritional: Goal: Maintenance of adequate nutrition will improve Outcome: Progressing

## 2024-08-30 ENCOUNTER — Encounter (HOSPITAL_COMMUNITY): Payer: Self-pay | Admitting: Orthopaedic Surgery

## 2024-08-30 DIAGNOSIS — M87052 Idiopathic aseptic necrosis of left femur: Secondary | ICD-10-CM | POA: Diagnosis not present

## 2024-08-30 LAB — COMPREHENSIVE METABOLIC PANEL WITH GFR
ALT: 17 U/L (ref 0–44)
AST: 19 U/L (ref 15–41)
Albumin: 2.4 g/dL — ABNORMAL LOW (ref 3.5–5.0)
Alkaline Phosphatase: 55 U/L (ref 38–126)
Anion gap: 10 (ref 5–15)
BUN: 13 mg/dL (ref 8–23)
CO2: 24 mmol/L (ref 22–32)
Calcium: 8.2 mg/dL — ABNORMAL LOW (ref 8.9–10.3)
Chloride: 105 mmol/L (ref 98–111)
Creatinine, Ser: 0.99 mg/dL (ref 0.61–1.24)
GFR, Estimated: >60 mL/min (ref >60)
Glucose, Bld: 87 mg/dL (ref 70–99)
Potassium: 4.2 mmol/L (ref 3.5–5.1)
Sodium: 139 mmol/L (ref 135–145)
Total Bilirubin: 0.4 mg/dL (ref 0.0–1.2)
Total Protein: 4.7 g/dL — ABNORMAL LOW (ref 6.5–8.1)

## 2024-08-30 LAB — CBC WITH DIFFERENTIAL/PLATELET
Abs Immature Granulocytes: 0.29 K/uL — ABNORMAL HIGH (ref 0.00–0.07)
Basophils Absolute: 0.1 K/uL (ref 0.0–0.1)
Basophils Relative: 0 %
Eosinophils Absolute: 0.3 K/uL (ref 0.0–0.5)
Eosinophils Relative: 2 %
HCT: 29.2 % — ABNORMAL LOW (ref 39.0–52.0)
Hemoglobin: 9.8 g/dL — ABNORMAL LOW (ref 13.0–17.0)
Immature Granulocytes: 3 %
Lymphocytes Relative: 15 %
Lymphs Abs: 1.7 K/uL (ref 0.7–4.0)
MCH: 32.2 pg (ref 26.0–34.0)
MCHC: 33.6 g/dL (ref 30.0–36.0)
MCV: 96.1 fL (ref 80.0–100.0)
Monocytes Absolute: 1 K/uL (ref 0.1–1.0)
Monocytes Relative: 9 %
Neutro Abs: 8.1 K/uL — ABNORMAL HIGH (ref 1.7–7.7)
Neutrophils Relative %: 71 %
Platelets: 290 K/uL (ref 150–400)
RBC: 3.04 MIL/uL — ABNORMAL LOW (ref 4.22–5.81)
RDW: 13.8 % (ref 11.5–15.5)
WBC: 11.3 K/uL — ABNORMAL HIGH (ref 4.0–10.5)
nRBC: 0 % (ref 0.0–0.2)

## 2024-08-30 LAB — RETICULOCYTES
Immature Retic Fract: 20.8 % — ABNORMAL HIGH (ref 2.3–15.9)
RBC.: 3.06 MIL/uL — ABNORMAL LOW (ref 4.22–5.81)
Retic Count, Absolute: 80.5 K/uL (ref 19.0–186.0)
Retic Ct Pct: 2.6 % (ref 0.4–3.1)

## 2024-08-30 LAB — VITAMIN B12: Vitamin B-12: 209 pg/mL (ref 180–914)

## 2024-08-30 LAB — IRON AND TIBC
Iron: 34 ug/dL — ABNORMAL LOW (ref 45–182)
Saturation Ratios: 15 % — ABNORMAL LOW (ref 17.9–39.5)
TIBC: 228 ug/dL — ABNORMAL LOW (ref 250–450)
UIBC: 194 ug/dL

## 2024-08-30 LAB — GLUCOSE, CAPILLARY
Glucose-Capillary: 88 mg/dL (ref 70–99)
Glucose-Capillary: 108 mg/dL — ABNORMAL HIGH (ref 70–99)
Glucose-Capillary: 128 mg/dL — ABNORMAL HIGH (ref 70–99)
Glucose-Capillary: 162 mg/dL — ABNORMAL HIGH (ref 70–99)
Glucose-Capillary: 117 mg/dL — ABNORMAL HIGH (ref 70–99)

## 2024-08-30 LAB — MAGNESIUM: Magnesium: 1.5 mg/dL — ABNORMAL LOW (ref 1.7–2.4)

## 2024-08-30 LAB — FERRITIN: Ferritin: 311 ng/mL (ref 24–336)

## 2024-08-30 LAB — PHOSPHORUS: Phosphorus: 3.1 mg/dL (ref 2.5–4.6)

## 2024-08-30 LAB — FOLATE: Folate: 8.4 ng/mL (ref >5.9)

## 2024-08-30 MED ORDER — DICYCLOMINE HCL 10 MG PO CAPS
10.0000 mg | ORAL_CAPSULE | Freq: Three times a day (TID) | ORAL | Status: DC
  Administered 2024-08-30 – 2024-08-31 (×5): 10 mg via ORAL
  Filled 2024-08-30 (×8): qty 1

## 2024-08-30 MED ORDER — POLYETHYLENE GLYCOL 3350 17 G PO PACK: 17.0000 g | PACK | Freq: Every day | ORAL | Status: DC | PRN

## 2024-08-30 MED ORDER — SENNOSIDES-DOCUSATE SODIUM 8.6-50 MG PO TABS: 1.0000 | ORAL_TABLET | Freq: Every evening | ORAL | Status: DC | PRN

## 2024-08-30 NOTE — Progress Notes (Signed)
 Occupational Therapy Treatment Patient Details Name: Gary Wright MRN: [id number] DOB: Dec 20, 1945 Today's Date: 08/30/2024   History of present illness 78 y.o. male presents to [facility] 08/18/24 with severe L hip pain. 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 Also with L hip joint effusion w/ plan for joint aspiration. Pt s/p L THA 11/7.   PMH positive for stage IV COPD, CAD s/p CABG, HTN, HLD, DM. Recent prior admits due to SOB with hypoxia.   OT comments  Pt in recliner and agreeable to OT session.  Pt needing max assist for LB Adls, verbally educated on AE and will plan to demonstrate next session.  Pt requires min to contact guard assist for transfers using RW, less assist from [facility] compared to recliner but does require cueing for hand placement and safety with RW.  He is fatigued and requests to return back to bed.  Min assist to transition to supine.  Educated pt to call for assist with toileting needs for safety. Continue to recommend <3hrs/day inpatient setting at dc.       If plan is discharge home, recommend the following:  A lot of help with bathing/dressing/bathroom;Assistance with cooking/housework;Assist for transportation;Help with stairs or ramp for entrance;A little help with walking and/or transfers   Equipment Recommendations  Other (comment) (defer)    Recommendations for Other Services      Precautions / Restrictions Precautions Precautions: Fall Recall of Precautions/Restrictions: Intact Precaution/Restrictions Comments: Direct anterior approach, no hip precautions Restrictions Weight Bearing Restrictions Per Provider Order: Yes LLE Weight Bearing Per Provider Order: Weight bearing as tolerated       Mobility Bed Mobility Overal bed mobility: Needs Assistance Bed Mobility: Sit to Supine       Sit to supine: Min assist, Used rails, HOB elevated   General bed mobility comments: slight  assist for L LE into bed    Transfers Overall transfer level: Needs assistance Equipment used: Rolling walker (2 wheels) Transfers: Sit to/from Stand Sit to Stand: Min assist, Contact guard assist           General transfer comment: min assist from recliner, contact guard from [facility].  cueing for hand placement     Balance Overall balance assessment: Needs assistance Sitting-balance support: Feet supported Sitting balance-Leahy Scale: Good     Standing balance support: Bilateral upper extremity supported, During functional activity, Reliant on assistive device for balance Standing balance-Leahy Scale: Poor Standing balance comment: reliant on RW support                           ADL either performed or assessed with clinical judgement   ADL Overall ADL's : Needs assistance/impaired                     Lower Body Dressing: Maximal assistance;Sit to/from stand Lower Body Dressing Details (indicate cue type and reason): pt would benefit from AE education, verbally described during session Toilet Transfer: Minimal assistance;Ambulation;Rolling walker (2 wheels);BSC/3in1           Functional mobility during ADLs: Minimal assistance;Rolling walker (2 wheels);Cueing for safety      Extremity/Trunk Assessment              Vision       Perception     Praxis     Communication Communication Communication: No apparent difficulties   Cognition Arousal: Alert Behavior During Therapy: [facility] for tasks  assessed/performed Cognition: No apparent impairments             OT - Cognition Comments: some repetition but not formally assessed                 Following commands: Intact        Cueing   Cueing Techniques: Verbal cues  Exercises      Shoulder Instructions       General Comments      Pertinent Vitals/ Pain       Pain Assessment Pain Assessment: Faces Faces Pain Scale: Hurts little more Pain Location: L hip Pain  Descriptors / Indicators: Operative site guarding, Grimacing, Discomfort, Aching Pain Intervention(s): Monitored during session, Repositioned, Limited activity within patient's tolerance  Home Living                                          Prior Functioning/Environment              Frequency  Min 2X/week        Progress Toward Goals  OT Goals(current goals can now be found in the care plan section)  Progress towards OT goals: Progressing toward goals  Acute Rehab OT Goals Patient Stated Goal: get to rehab OT Goal Formulation: With patient Time For Goal Achievement: 09/11/24 Potential to Achieve Goals: Good  Plan      Co-evaluation                 AM-PAC OT 6 Clicks Daily Activity     Outcome Measure   Help from another person eating meals?: None Help from another person taking care of personal grooming?: A Little Help from another person toileting, which includes using toliet, bedpan, or urinal?: A Lot Help from another person bathing (including washing, rinsing, drying)?: A Little Help from another person to put on and taking off regular upper body clothing?: A Little Help from another person to put on and taking off regular lower body clothing?: A Lot 6 Click Score: 17    End of Session Equipment Utilized During Treatment: Gait belt;Rolling walker (2 wheels)  OT Visit Diagnosis: Pain;History of falling (Z91.81) Pain - Right/Left: Left Pain - part of body: Hip   Activity Tolerance Patient tolerated treatment well   Patient Left in bed;with call bell/phone within reach;with bed alarm set   Nurse Communication Mobility status;Precautions;Other (comment) (pt asking for stomach meds)        Time: 8953-8893 OT Time Calculation (min): 20 min  Charges: OT General Charges $OT Visit: 1 Visit OT Treatments $Self Care/Home Management : 8-22 mins  Gary Wright, OT Acute Rehabilitation Services Office [phone] Secure Chat  Preferred    Gary Wright 08/30/2024, 11:31 AM

## 2024-08-30 NOTE — Progress Notes (Signed)
 PROGRESS NOTE    Sharbel Sahagun  FMW:[id number] DOB: Feb 24, 1946 DOA: 08/18/2024 PCP: Lucius Krabbe, NP   Brief Narrative:  Bryan Goin is a 78 y.o. male with a history of COPD, asthma, CAD, hyperlipidemia, hypertension, stress-induced cardiomyopathy, chronic avascular necrosis of left hip.  Patient presented secondary to worsening left hip pain in setting of known vascular necrosis.  MRI findings significant for findings most consistent with rapidly progressive femoral head lysis osteonecrosis.  Orthopedic surgery was consulted and performed a total hip arthroplasty on 11/7.  He is postoperative day 2 for severe AVN and doing fairly seems to complain of some pain. Orthopedic surgery recommending weightbearing as tolerated and PT/OT recommending SNF for discharge and he appears medically stable and can be discharged 08/31/24 once bed is available.   Assessment and Plan:  Acute on chronic left hip pain with Osteonecrosis of the left hip status post THA:  Orthopedic surgery was consulted and performed a total hip arthroplasty on 11/7. -Orthopedic surgery recommendations: WBAT, PT -PT/OT recommendations: Skilled nursing facility -Pain Control w/ po Acetaminophen  325-650 po q6hprn Mild Pain, Hydrocodone-Acetaminophen  5-325 mg per tab 1-2 po q8hprn Moderate Pain, Hydrocodone-Acetaminophen  7.5-325 mg per tab 1-2 po q8hprn Severe Pain and IV Morphine  0.5-1 mg IV q6hprn Severe Pain -C/w Supportive Care and Antiemetics w/ po/IV Ondansetron  4 mg q6hprn Nausea and po/IV Metaclopramide 5-10 mg q8hprn Nausea if Ondansetron  ineffective -VTE Prophylaxis per Orthopedic Surgery and they are recommending C/w ASA 81 mg po BID -He appears medically stable for D/C and awaiting bed availability at SNF   COPD: Stable.  No wheezing. Continue Breztri 2 puff IH BID and DuoNebs 3 mL q4hprn Wheezing and SOB. C/w Dupilumab  300 mg sq q14 Days and dose is due 11/10. CTM Respiratory Status carefully.     Essential Hypertension: Continue Irbesartan 75 mg po dailyand Metoprolol  Succinate 12.5 mg po Daily. CTM BP per Protocol. Last BP reading was 127/56   Elevated Creatinine / Renal Insufficiency: Mild.  Baseline creatinine of about 1. Creatinine peak of 1.26, not meeting criteria for AKI. BUN/Cr Trend (Repeat BUN/Cr this AM pending): Recent Labs  Lab 08/18/24 1432 08/19/24 0313 08/24/24 0517 08/25/24 0916 08/26/24 0345 08/27/24 0359 08/29/24 0514  BUN 22 19 23 15 18 11 17   CREATININE 1.15 1.05 1.26* 0.92 1.20 1.09 1.17  -Discontinued IVF  -Avoid Nephrotoxic Medications, Contrast Dyes, Hypotension and Dehydration to Ensure Adequate Renal Perfusion and will need to Renally Adjust Meds. CTM & Trend Renal Function carefully. Repeat CMP in the AM   HLD: C/w Atorvastatin  40 mg po qHS  Leukocytosis: Mild and likely elevated in the setting of surgical intervention. WBC Trend improving again:  Recent Labs  Lab 08/15/24 1417 08/18/24 1432 08/19/24 0313 08/24/24 0517 08/26/24 0345 08/29/24 0514 08/30/24 0635  WBC 13.7* 11.1* 11.4* 8.8 7.9 13.2* 11.3*  -Continue to monitor for signs and symptoms infection; repeat CBC in a.m.  Chronic Diastolic Heart Failure: Stable. Furosemide  held secondary to AKI on admission.  Continue to monitor for signs and symptoms of volume overload   Constipation: Resolved and how having significant Diarrhea so will hold is bowel regimen and just continue Miralax 17 grams PRN, Senna-Docusate 1 tab po qHSprn, and Mag Citrate  1 bottle po PRN Severe Constipation   Depression and Anxiety: C/w Buproprion 300 mg po Daily and Citalopram  40 mg po Daily   Normocytic Anemia: Hgb/Hct Trend had dropped but now stable:  Recent Labs  Lab 08/15/24 1417 08/18/24 1432 08/19/24 0313 08/24/24 0517  08/26/24 0345 08/29/24 0514 08/30/24 0635  HGB 13.5 12.7* 13.6 12.5* 12.0* 9.5* 9.8*  HCT 41.3 38.3* 41.0 38.2* 36.9* 28.0* 29.2*  MCV 96.5 95.3 97.4 96.0 97.6 94.3 96.1   -Checking Anemia Panel and it is pending. CTM for S/Sx of Bleeding; No overt bleeding noted. Repeat CBC in the AM   GERD/GI Prophylaxis: C/w Pantoprazole  40 mg po Daily and Dailyprn Heartburn and Maalox/Mylanta 30 mL po q4hprn Indigestion; Will add Bentyl as well.   Hypoalbuminemia: Patient's Albumin Lvl Trend went from 3.2 -> 2.6 -> 3.4 and repeat is pending this AM. CTM & Trend & repeat CMP in the AM  Overweight: Complicates overall prognosis and care. Estimated body mass index is 27.52 kg/m as calculated from the following:   Height as of this encounter: 5' 8 (1.727 m).   Weight as of this encounter: 82.1 kg. Weight Loss and Dietary Counseling given   DVT prophylaxis: SCDs Start: 08/27/24 2034    Code Status: Full Code Family Communication: No family present @ bedside   Disposition Plan:  Level of care: Telemetry Status is: Inpatient Remains inpatient appropriate because: He is medically stable for discharge however the bed for SNF is not available until 08/31/2024   Consultants:  Orthopedic Surgery  Procedures:  As delineated as above;   Operative Procedures by Dr. Kay Cummins on 08/27/24 1. Total hip replacement; Left hip; uncemented [id number]   Antimicrobials:  Anti-infectives (From admission, onward)    Start     Dose/Rate Route Frequency Ordered Stop   08/27/24 2200  ceFAZolin  (ANCEF ) IVPB 2g/100 mL premix        2 g 200 mL/hr over 30 Minutes Intravenous Every 6 hours 08/27/24 2034 08/28/24 0557   08/27/24 1642  vancomycin (VANCOCIN) powder  Status:  Discontinued          As needed 08/27/24 1642 08/27/24 1755   08/27/24 0600  ceFAZolin  (ANCEF ) IVPB 2g/100 mL premix        2 g 200 mL/hr over 30 Minutes Intravenous On call to O.R. 08/27/24 0015 08/27/24 1620       Subjective: Seen and examined at bedside sitting in chair and feels fairly well.  States he went for a walk this morning and was surprised.  Having quite a bit of diarrhea but was receiving quite a bit  of laxatives which has now been stopped.  No nausea or vomiting.  No other concerns or complaints at this time.  Overall feels better.  Supposed to get his Dupixent  today.  No other concerns or complaints at this time.  Objective: Vitals:   08/30/24 0527 08/30/24 0726 08/30/24 0837 08/30/24 0843  BP: (!) 141/79 (!) 127/56    Pulse: 85 100    Resp: 18 16    Temp: 98.1 F (36.7 C) 98.9 F (37.2 C)    TempSrc: Axillary Oral    SpO2: 100% 97% 96% 96%  Weight:      Height:        Intake/Output Summary (Last 24 hours) at 08/30/2024 1225 Last data filed at 08/30/2024 9147 Gross per 24 hour  Intake --  Output 1001 ml  Net -1001 ml   Filed Weights   08/18/24 1331  Weight: 82.1 kg   Examination: Physical Exam:  Constitutional: WN/WD overweight chronically ill-appearing elderly Caucasian male in no acute distress Respiratory: Diminished to auscultation bilaterally with coarse breath sounds, no wheezing, rales, rhonchi or crackles. Normal respiratory effort and patient is not tachypenic. No accessory muscle use.  Cardiovascular: RRR, no murmurs / rubs / gallops. S1 and S2 auscultated. Mild extremity edema Abdomen: Soft, non-tender, Distended 2/2 body habitus. Bowel sounds positive.  GU: Deferred. Musculoskeletal: No clubbing / cyanosis of digits/nails. No joint deformity upper and lower extremities.  Skin: No rashes, lesions, ulcers on a limited skin evaluation. No induration; Warm and dry.  Neurologic: CN 2-12 grossly intact with no focal deficits. Romberg sign and cerebellar reflexes not assessed.  Psychiatric: Normal judgment and insight. Alert and oriented x 3. Normal mood and appropriate affect.   Data Reviewed: I have personally reviewed following labs and imaging studies  CBC: Recent Labs  Lab 08/24/24 0517 08/26/24 0345 08/29/24 0514 08/30/24 0635  WBC 8.8 7.9 13.2* 11.3*  NEUTROABS 6.1  --   --  8.1*  HGB 12.5* 12.0* 9.5* 9.8*  HCT 38.2* 36.9* 28.0* 29.2*  MCV 96.0  97.6 94.3 96.1  PLT 236 216 270 290   Basic Metabolic Panel: Recent Labs  Lab 08/24/24 0517 08/25/24 0916 08/26/24 0345 08/27/24 0359 08/29/24 0514  NA 133* 138 138 136 136  K 4.2 3.3* 4.2 4.4 4.0  CL 97* 107 100 101 101  CO2 25 25 26 25 24   GLUCOSE 117* 148* 103* 108* 153*  BUN 23 15 18 11 17   CREATININE 1.26* 0.92 1.20 1.09 1.17  CALCIUM  8.8* 6.9* 8.6* 8.4* 8.3*  MG 1.8  --   --   --   --    GFR: Estimated Creatinine Clearance: 54.4 mL/min (by C-G formula based on SCr of 1.17 mg/dL). Liver Function Tests: No results for input(s): AST, ALT, ALKPHOS, BILITOT, PROT, ALBUMIN in the last 168 hours. No results for input(s): LIPASE, AMYLASE in the last 168 hours. No results for input(s): AMMONIA in the last 168 hours. Coagulation Profile: No results for input(s): INR, PROTIME in the last 168 hours. Cardiac Enzymes: No results for input(s): CKTOTAL, CKMB, CKMBINDEX, TROPONINI in the last 168 hours. BNP (last 3 results) No results for input(s): PROBNP in the last 8760 hours. HbA1C: No results for input(s): HGBA1C in the last 72 hours. CBG: Recent Labs  Lab 08/29/24 1659 08/29/24 2131 08/30/24 0631 08/30/24 0843 08/30/24 1120  GLUCAP 138* 159* 88 108* 128*   Lipid Profile: No results for input(s): CHOL, HDL, LDLCALC, TRIG, CHOLHDL, LDLDIRECT in the last 72 hours. Thyroid  Function Tests: No results for input(s): TSH, T4TOTAL, FREET4, T3FREE, THYROIDAB in the last 72 hours. Anemia Panel: Recent Labs    08/30/24 0635  RETICCTPCT 2.6   Sepsis Labs: No results for input(s): PROCALCITON, LATICACIDVEN in the last 168 hours.  Recent Results (from the past 240 hours)  Surgical pcr screen     Status: None   Collection Time: 08/26/24  5:06 PM   Specimen: Nasal Mucosa; Nasal Swab  Result Value Ref Range Status   MRSA, PCR NEGATIVE NEGATIVE Final   Staphylococcus aureus NEGATIVE NEGATIVE Final    Comment:  (NOTE) The Xpert SA Assay (FDA approved for NASAL specimens in patients 80 years of age and older), is one component of a comprehensive surveillance program. It is not intended to diagnose infection nor to guide or monitor treatment. Performed at [facility], [address], [place], [place] [address]     Radiology Studies: No results found.  Scheduled Meds:  aspirin   81 mg Oral BID   atorvastatin   40 mg Oral QHS   budesonide -glycopyrrolate-formoterol  2 puff Inhalation BID   buPROPion   300 mg Oral Daily   citalopram   40 mg Oral Daily   dicyclomine  10 mg Oral TID AC & HS   Dupilumab   300 mg Subcutaneous Q14 Days   insulin  aspart  0-9 Units Subcutaneous TID WC   irbesartan  75 mg Oral Daily   metoprolol  succinate  12.5 mg Oral Daily   pantoprazole   40 mg Oral Daily   Continuous Infusions:   LOS: 11 days   Alejandro Marker, DO [facility] Available via Epic secure chat 7am-7pm After these hours, please refer to coverage provider listed on [email or web address] 08/30/2024, 12:25 PM

## 2024-08-30 NOTE — TOC Progression Note (Addendum)
 Transition of Care [facility]) - Progression Note    Patient Details  Name: Gary Wright MRN: [id number] Date of Birth: 1945/11/03  [facility] CM/SW Contact  Bridget Cordella Simmonds, LCSW Phone Number: 08/30/2024, 10:31 AM  Clinical Narrative:   SNF auth request submitted in [place].   1050: Message from [facility]: will not have bed available until tomorrow.    1345: SNF auth approved: [id number], 3 days: 11/10-11/12.  Expected Discharge Plan: Skilled Nursing Facility Barriers to Discharge: Continued Medical Work up, SNF Pending bed offer               Expected Discharge Plan and Services                                               Social Drivers of Health (SDOH) Interventions SDOH Screenings   Food Insecurity: No Food Insecurity (08/19/2024)  Housing: Low Risk  (08/19/2024)  Transportation Needs: No Transportation Needs (08/19/2024)  Utilities: Not At Risk (08/19/2024)  Alcohol Screen: Low Risk  (11/18/2023)  Depression (PHQ2-9): Medium Risk (11/18/2023)  Financial Resource Strain: Low Risk  (11/18/2023)  Physical Activity: Insufficiently Active (11/18/2023)  Social Connections: Moderately Isolated (08/19/2024)  Stress: No Stress Concern Present (11/18/2023)  Tobacco Use: Medium Risk (08/27/2024)  Health Literacy: Adequate Health Literacy (11/18/2023)    Readmission Risk Interventions     No data to display

## 2024-08-30 NOTE — Progress Notes (Signed)
 Physical Therapy Treatment Patient Details Name: Gary Wright MRN: [id number] DOB: April 19, 1946 Today's Date: 08/30/2024   History of Present Illness 78 y.o. male presents to [facility] 08/18/24 with severe L hip pain. 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 Also with L hip joint effusion w/ plan for joint aspiration. Pt s/p L THA 11/7.   PMH positive for stage IV COPD, CAD s/p CABG, HTN, HLD, DM. Recent prior admits due to SOB with hypoxia.    PT Comments  Pt resting in bed on arrival, pleasant and eager for mobility and demonstrating continued progress towards acute goals. Pt able to come to sitting EOB with CGA for safety with increased time and use of gait belt as leg lifter. Pt requiring min A to boost to stand with assist on rise and to facilitate anterior weight shift as pt with noted posterior bias on initial rise. Pt progressing ambulation distance this session with RW for support and up to min A to provide steadying assist. Pt with continued DOE 3/4 with SpO2 88-95% throughout. Pt up in chair at end of session with all needs met. Patient will benefit from continued inpatient follow up therapy, <3 hours/day, will continue to follow acutely.     If plan is discharge home, recommend the following: A lot of help with walking and/or transfers;A lot of help with bathing/dressing/bathroom;Assistance with cooking/housework;Assist for transportation;Help with stairs or ramp for entrance   Can travel by private vehicle     No  Equipment Recommendations  Wheelchair (measurements PT);Wheelchair cushion (measurements PT);BSC/3in1    Recommendations for Other Services       Precautions / Restrictions Precautions Precautions: Fall Recall of Precautions/Restrictions: Intact Precaution/Restrictions Comments: Direct anterior approach, no hip precautions Restrictions Weight Bearing Restrictions Per Provider Order: Yes LLE  Weight Bearing Per Provider Order: Weight bearing as tolerated     Mobility  Bed Mobility Overal bed mobility: Needs Assistance Bed Mobility: Supine to Sit     Supine to sit: HOB elevated, Used rails, Contact guard     General bed mobility comments: pt utilizing gait belt as leg lifter, able to self mobilize to EOB without assist    Transfers Overall transfer level: Needs assistance Equipment used: Rolling walker (2 wheels) Transfers: Sit to/from Stand Sit to Stand: Contact guard assist, Min assist           General transfer comment: light min A to facilitate anterior weight shift on rise, cues for hand placement    Ambulation/Gait Ambulation/Gait assistance: Contact guard assist, Min assist Gait Distance (Feet): 84 Feet (x1 standing rest) Assistive device: Rolling walker (2 wheels) Gait Pattern/deviations: Step-to pattern, Decreased step length - right, Decreased step length - left, Decreased stance time - left, Decreased weight shift to left Gait velocity: decreased     General Gait Details: Pt took short steps with heavy reliance on BUE support on RW. x1 standing rest break, pt with noted DOE 3/4 with SpO2 88-95% on RA   Stairs             Wheelchair Mobility     Tilt Bed    Modified Rankin (Stroke Patients Only)       Balance Overall balance assessment: Needs assistance Sitting-balance support: Feet supported Sitting balance-Leahy Scale: Good Sitting balance - Comments: reliant on B UE support and support to prop L LE in extension   Standing balance support: Bilateral upper extremity supported Standing balance-Leahy Scale: Poor Standing balance comment:  reliant on RW support                            Communication Communication Communication: No apparent difficulties  Cognition Arousal: Alert Behavior During Therapy: WFL for tasks assessed/performed   PT - Cognitive impairments: No apparent impairments                          Following commands: Intact      Cueing Cueing Techniques: Verbal cues  Exercises      General Comments        Pertinent Vitals/Pain Pain Assessment Pain Assessment: Faces Faces Pain Scale: Hurts little more Pain Location: L hip Pain Descriptors / Indicators: Operative site guarding, Grimacing, Discomfort, Aching Pain Intervention(s): Monitored during session, Limited activity within patient's tolerance, Premedicated before session, Repositioned    Home Living                          Prior Function            PT Goals (current goals can now be found in the care plan section) Acute Rehab PT Goals Patient Stated Goal: Have less pain and move better PT Goal Formulation: With patient Time For Goal Achievement: 09/11/24 Progress towards PT goals: Progressing toward goals    Frequency    Min 2X/week      PT Plan      Co-evaluation              AM-PAC PT 6 Clicks Mobility   Outcome Measure  Help needed turning from your back to your side while in a flat bed without using bedrails?: A Lot Help needed moving from lying on your back to sitting on the side of a flat bed without using bedrails?: A Lot Help needed moving to and from a bed to a chair (including a wheelchair)?: A Little Help needed standing up from a chair using your arms (e.g., wheelchair or bedside chair)?: A Little Help needed to walk in hospital room?: A Little Help needed climbing 3-5 steps with a railing? : Total 6 Click Score: 14    End of Session Equipment Utilized During Treatment: Gait belt Activity Tolerance: Patient tolerated treatment well Patient left: in chair;with call bell/phone within reach;with chair alarm set Nurse Communication: Mobility status PT Visit Diagnosis: Other abnormalities of gait and mobility (R26.89);History of falling (Z91.81);Pain Pain - Right/Left: Left Pain - part of body: Hip     Time: 0945-1001 PT Time Calculation (min) (ACUTE  ONLY): 16 min  Charges:    $Gait Training: 8-22 mins PT General Charges $$ ACUTE PT VISIT: 1 Visit                     Veto Macqueen R. PTA Acute Rehabilitation Services Office: [phone]   Therisa CHRISTELLA Boor 08/30/2024, 10:13 AM

## 2024-08-31 ENCOUNTER — Ambulatory Visit (HOSPITAL_COMMUNITY)

## 2024-08-31 ENCOUNTER — Ambulatory Visit: Admitting: Sports Medicine

## 2024-08-31 DIAGNOSIS — M87052 Idiopathic aseptic necrosis of left femur: Secondary | ICD-10-CM | POA: Diagnosis not present

## 2024-08-31 LAB — GLUCOSE, CAPILLARY
Glucose-Capillary: 122 mg/dL — ABNORMAL HIGH (ref 70–99)
Glucose-Capillary: 120 mg/dL — ABNORMAL HIGH (ref 70–99)
Glucose-Capillary: 138 mg/dL — ABNORMAL HIGH (ref 70–99)

## 2024-08-31 MED ORDER — MAGNESIUM SULFATE 2 GM/50ML IV SOLN
2.0000 g | Freq: Once | INTRAVENOUS | Status: AC
  Administered 2024-08-31: 2 g via INTRAVENOUS
  Filled 2024-08-31: qty 50

## 2024-08-31 MED ORDER — POLYETHYLENE GLYCOL 3350 17 G PO PACK: 17.0000 g | PACK | Freq: Every day | ORAL | Status: DC | PRN

## 2024-08-31 MED ORDER — SENNOSIDES-DOCUSATE SODIUM 8.6-50 MG PO TABS: 1.0000 | ORAL_TABLET | Freq: Every evening | ORAL | Status: DC | PRN

## 2024-08-31 MED ORDER — ONDANSETRON HCL 4 MG PO TABS: 4.0000 mg | ORAL_TABLET | Freq: Four times a day (QID) | ORAL | Status: DC | PRN

## 2024-08-31 MED ORDER — ACETAMINOPHEN 325 MG PO TABS: 325.0000 mg | ORAL_TABLET | Freq: Four times a day (QID) | ORAL | Status: DC | PRN

## 2024-08-31 NOTE — Discharge Summary (Addendum)
 Physician Discharge Summary   Patient: Gary Wright MRN: [id number] DOB: July 17, 1946  Admit date:     08/18/2024  Discharge date: 08/31/24  Discharge Physician: Alejandro Marker, DO   PCP: Lucius Krabbe, NP   Recommendations at discharge:   Follow-up with PCP within 1 to 2 weeks repeat CBC, CMP, mag, Phos within 1 week Follow-up with Orthopedic Surgery within 2 weeks and continue weightbearing as tolerated and DVT prophylaxis per orthopedic recommendations Follow up with Pulmonary in the outpatient setting within 1-2 weeks  Discharge Diagnoses: Principal Problem:   Avascular necrosis of bone of left hip [facility])  Resolved Problems:   Hip pain  Hospital Course: Gary Wright is a 78 y.o. male with a history of COPD, asthma, CAD, hyperlipidemia, hypertension, stress-induced cardiomyopathy, chronic avascular necrosis of left hip.  Patient presented secondary to worsening left hip pain in setting of known vascular necrosis.  MRI findings significant for findings most consistent with rapidly progressive femoral head lysis osteonecrosis.  Orthopedic surgery was consulted and performed a total hip arthroplasty on 11/7.  He is postoperative day 2 for severe AVN and doing fairly seems to complain of some pain. Orthopedic surgery recommending weightbearing as tolerated and PT/OT recommending SNF for discharge and he appears medically stable and can be discharged 08/31/24 now that bed is available  Assessment and Plan:  Acute on chronic left hip pain with Osteonecrosis of the left hip status post THA:  Orthopedic surgery was consulted and performed a total hip arthroplasty on 11/7. -Orthopedic surgery recommendations: WBAT, PT -PT/OT recommendations: Skilled nursing facility -Pain Control w/ po Acetaminophen  325-650 po q6hprn Mild Pain, Hydrocodone-Acetaminophen  5-325 mg per tab 1-2 po q8hprn Moderate Pain, Hydrocodone-Acetaminophen  7.5-325 mg per tab 1-2 po q8hprn Severe Pain and IV  Morphine  0.5-1 mg IV q6hprn Severe Pain -C/w Supportive Care and Antiemetics w/ po/IV Ondansetron  4 mg q6hprn Nausea and po/IV Metaclopramide 5-10 mg q8hprn Nausea if Ondansetron  ineffective -VTE Prophylaxis per Orthopedic Surgery and they are recommending C/w ASA 81 mg po BID -He appears medically stable for D/C and awaiting bed availability at SNF and has a bed today.   COPD: Stable.  No wheezing. Continue Breztri 2 puff IH BID and DuoNebs 3 mL q4hprn Wheezing and SOB. C/w Dupilumab  300 mg sq q14 Days and dose is due 11/10. CTM Respiratory Status carefully.    Essential Hypertension: Continue Irbesartan 75 mg po dailyand Metoprolol  Succinate 12.5 mg po Daily. CTM BP per Protocol. Last BP reading was 124/71   Elevated Creatinine / Renal Insufficiency: Mild.  Baseline creatinine of about 1. Creatinine peak of 1.26, not meeting criteria for AKI. BUN/Cr Trend (Repeat BUN/Cr this AM pending): Recent Labs  Lab 08/19/24 0313 08/24/24 0517 08/25/24 0916 08/26/24 0345 08/27/24 0359 08/29/24 0514 08/30/24 0635  BUN 19 23 15 18 11 17 13   CREATININE 1.05 1.26* 0.92 1.20 1.09 1.17 0.99  -Discontinued IVF  -Avoid Nephrotoxic Medications, Contrast Dyes, Hypotension and Dehydration to Ensure Adequate Renal Perfusion and will need to Renally Adjust Meds. CTM & Trend Renal Function carefully. Repeat CMP within 1 week  HLD: C/w Atorvastatin  40 mg po qHS  Leukocytosis: Mild and likely elevated in the setting of surgical intervention. WBC Trend improving again:  Recent Labs  Lab 08/15/24 1417 08/18/24 1432 08/19/24 0313 08/24/24 0517 08/26/24 0345 08/29/24 0514 08/30/24 0635  WBC 13.7* 11.1* 11.4* 8.8 7.9 13.2* 11.3*  -Continue to monitor for signs and symptoms infection; repeat CBC within 1 week  Chronic Diastolic Heart Failure: Stable.  Furosemide  held secondary to AKI on admission.  Continue to monitor for signs and symptoms of volume overload and can be resumed at discharge   Constipation:  Resolved and how having significant Diarrhea so will hold is bowel regimen and just continue Miralax 17 grams PRN, Senna-Docusate 1 tab po qHSprn, and Mag Citrate  1 bottle po PRN Severe Constipation   Depression and Anxiety: C/w Buproprion 300 mg po Daily and Citalopram  40 mg po Daily   Normocytic Anemia: Hgb/Hct Trend had dropped but now stable:  Recent Labs  Lab 08/15/24 1417 08/18/24 1432 08/19/24 0313 08/24/24 0517 08/26/24 0345 08/29/24 0514 08/30/24 0635  HGB 13.5 12.7* 13.6 12.5* 12.0* 9.5* 9.8*  HCT 41.3 38.3* 41.0 38.2* 36.9* 28.0* 29.2*  MCV 96.5 95.3 97.4 96.0 97.6 94.3 96.1  -Checked Anemia Panel and it showed an iron level 34, UIBC 194, TIBC 228, saturation ratios of 15%, ferritin level 311, folate of 8.4 and vitamin B12 209. CTM for S/Sx of Bleeding; No overt bleeding noted. Repeat CBC in the AM   GERD/GI Prophylaxis: C/w Pantoprazole  40 mg po Daily and Dailyprn Heartburn and Maalox/Mylanta 30 mL po q4hprn Indigestion; Will add Bentyl as well.   Hypoalbuminemia: Patient's Albumin Lvl Trend went from 3.2 -> 2.6 -> 3.4 and was 2.4 on last check. CTM & Trend & repeat CMP in the AM  Overweight: Complicates overall prognosis and care. Estimated body mass index is 27.52 kg/m as calculated from the following:   Height as of this encounter: 5' 8 (1.727 m).   Weight as of this encounter: 82.1 kg. Weight Loss and Dietary Counseling given  Consultants: Orthopedic Surgery Procedures performed: As delineated as above  Disposition: Skilled nursing facility Diet recommendation:  Cardiac and Carb modified diet DISCHARGE MEDICATION: Allergies as of 08/31/2024   No Known Allergies      Medication List     STOP taking these medications    HYDROcodone-acetaminophen  5-325 MG tablet Commonly known as: NORCO/VICODIN   predniSONE  10 MG tablet Commonly known as: DELTASONE        TAKE these medications    acetaminophen  325 MG tablet Commonly known as: TYLENOL  Take 1-2  tablets (325-650 mg total) by mouth every 6 (six) hours as needed for mild pain (pain score 1-3) or fever (or temp > 100.5).   albuterol  (2.5 MG/3ML) 0.083% nebulizer solution Commonly known as: PROVENTIL  Take 3 mLs (2.5 mg total) by nebulization every 4 (four) hours as needed for wheezing or shortness of breath.   albuterol  108 (90 Base) MCG/ACT inhaler Commonly known as: VENTOLIN  HFA Inhale 1-2 puffs into the lungs every 4 (four) hours as needed for wheezing or shortness of breath.   aspirin  EC 81 MG tablet Take 1 tablet (81 mg total) by mouth 2 (two) times daily.   atorvastatin  40 MG tablet Commonly known as: LIPITOR TAKE 1 TABLET(40 MG) BY MOUTH DAILY AT 6 PM   budesonide  0.5 MG/2ML nebulizer solution Commonly known as: Pulmicort  Take 2 mLs (0.5 mg total) by nebulization 2 (two) times daily.   buPROPion  300 MG 24 hr tablet Commonly known as: WELLBUTRIN  XL Take 300 mg by mouth daily.   citalopram  40 MG tablet Commonly known as: CELEXA  Take 40 mg by mouth daily.   cyclobenzaprine  5 MG tablet Commonly known as: FLEXERIL  Take 1 tablet (5 mg total) by mouth 3 (three) times daily as needed for muscle spasms.   diclofenac Sodium 1 % Gel Commonly known as: Voltaren Apply 4 g topically 4 (four) times  daily as needed. Rub on sore joints.   Dupixent  300 MG/2ML Soaj Generic drug: Dupilumab  Inject 300 mg into the skin every 14 (fourteen) days.   furosemide  20 MG tablet Commonly known as: LASIX  Take 1 tablet (20 mg total) by mouth daily.   glipiZIDE  2.5 MG 24 hr tablet Commonly known as: GLUCOTROL  XL Take 1 tablet (2.5 mg total) by mouth as directed. Take 2 pills in the morning and 1 pill in the evening.   ipratropium-albuterol  0.5-2.5 (3) MG/3ML Soln Commonly known as: DUONEB Take 3 mLs by nebulization 4 (four) times daily.   metoprolol  succinate 25 MG 24 hr tablet Commonly known as: TOPROL -XL Take 0.5 tablets (12.5 mg total) by mouth See admin instructions. Take 1/2  tablet (12.5mg ) by mouth once daily at noon.   olmesartan  40 MG tablet Commonly known as: BENICAR  Take 1 tablet (40 mg total) by mouth daily.   ondansetron  4 MG tablet Commonly known as: ZOFRAN  Take 1 tablet (4 mg total) by mouth every 6 (six) hours as needed for nausea.   oxyCODONE -acetaminophen  5-325 MG tablet Commonly known as: Percocet Take 1-2 tablets by mouth 2 (two) times daily as needed for severe pain (pain score 7-10).   pantoprazole  40 MG tablet Commonly known as: PROTONIX  One tablet by mouth daily 30 to 60 minutes before the first meal of the day What changed:  how much to take how to take this when to take this reasons to take this additional instructions   polyethylene glycol 17 g packet Commonly known as: MIRALAX / GLYCOLAX Take 17 g by mouth daily as needed for moderate constipation.   senna-docusate 8.6-50 MG tablet Commonly known as: Senokot-S Take 1 tablet by mouth at bedtime as needed for mild constipation.   Trelegy Ellipta  200-62.5-25 MCG/ACT Aepb Generic drug: Fluticasone -Umeclidin-Vilant Inhale 1 puff into the lungs every evening.   triamcinolone  ointment 0.5 % Commonly known as: KENALOG  Apply topically 2 (two) times daily. What changed:  how much to take when to take this reasons to take this               Durable Medical Equipment  (From admission, onward)           Start     Ordered   08/27/24 2034  DME Walker rolling  Once       Question:  Patient needs a walker to treat with the following condition  Answer:  History of hip replacement   08/27/24 2033   08/27/24 2034  DME 3 n 1  Once        08/27/24 2033   08/27/24 2034  DME Bedside commode  Once       Question:  Patient needs a bedside commode to treat with the following condition  Answer:  History of hip replacement   08/27/24 2033              Discharge Care Instructions  (From admission, onward)           Start     Ordered   08/31/24 0000  Discharge  wound care:       Comments: Reinforce Dressing as needed   08/31/24 1215   08/27/24 0000  Weight bearing as tolerated        08/27/24 1554            Contact information for follow-up providers     Jule Ronal CROME, PA-C Follow up in 2 week(s).   Specialty: Orthopedic Surgery Why: For suture  removal, For wound re-check Contact information: [address] [place]  [place] [address] [phone]              Contact information for after-discharge care     Destination     [facility] [place] .   Service: Skilled Nursing Contact information: [address] [place]  [address] [phone]                    Discharge Exam: Filed Weights   08/18/24 1331  Weight: 82.1 kg   Vitals:   08/31/24 0507 08/31/24 0800  BP: 135/73 124/71  Pulse: 97 96  Resp: 20 20  Temp: 98.3 F (36.8 C) 98.5 F (36.9 C)  SpO2: 97% 99%   Examination: Physical Exam:  Constitutional: WN/WD overweight chronically ill-appearing Caucasian male in no acute distress Respiratory: Slight diminished to auscultation bilaterally, no wheezing, rales, rhonchi or crackles. Normal respiratory effort and patient is not tachypenic. No accessory muscle use.  Unlabored breathing Cardiovascular: RRR, no murmurs / rubs / gallops. S1 and S2 auscultated.  Mild extremity edema Abdomen: Soft, non-tender, distended secondary to body habitus.  Bowel sounds positive.  GU: Deferred. Musculoskeletal: No clubbing / cyanosis of digits/nails. No joint deformity upper and lower extremities. Good ROM, no contractures. Normal strength and muscle tone.  Skin: No rashes, lesions, ulcers on limited skin evaluation. No induration; Warm and dry.  Neurologic: CN 2-12 grossly intact with no focal deficits. Romberg sign and cerebellar reflexes not assessed.  Psychiatric: Normal judgment and insight. Alert and oriented x 3. Normal mood and appropriate affect.   Condition at discharge:  stable  The results of significant diagnostics from this hospitalization (including imaging, microbiology, ancillary and laboratory) are listed below for reference.   Imaging Studies: DG Pelvis Portable Result Date: 08/27/2024 CLINICAL DATA:  Postop EXAM: PORTABLE PELVIS 1-2 VIEWS COMPARISON:  08/18/2024 FINDINGS: Status post left hip replacement with intact hardware and normal alignment. Gas in the soft tissues consistent with recent surgery. Pubic symphysis and rami are intact. Advanced right hip degenerative change with bone on bone contact and subarticular sclerosis. IMPRESSION: Status post left hip replacement with expected postsurgical change. Electronically Signed   By: Luke Bun M.D.   On: 08/27/2024 23:09   DG HIP UNILAT WITH PELVIS 2-3 VIEWS LEFT Result Date: 08/27/2024 CLINICAL DATA:  Hip surgery EXAM: DG HIP (WITH OR WITHOUT PELVIS) 2-3V*L* COMPARISON:  08/18/2024 FINDINGS: Multiple low resolution intraoperative spot views of the left hip. Total fluoroscopy time was 19.1 seconds, fluoroscopic dose of 2.45 mGy. Images were obtained during operative left hip replacement IMPRESSION: Intraoperative fluoroscopic assistance provided during left hip replacement. Electronically Signed   By: Luke Bun M.D.   On: 08/27/2024 23:09   DG C-Arm 1-60 Min-No Report Result Date: 08/27/2024 Fluoroscopy was utilized by the requesting physician.  No radiographic interpretation.   DG FL ASP/INJ MAJOR (SHOULDER, HIP, KNEE) Result Date: 08/19/2024 CLINICAL DATA:  CLINICAL DATA Request for aspiration of L hip joint effusion. EXAM: DIAGNOSTIC RIGHT HIP ASPIRATION UNDER FLUOROSCOPIC GUIDANCE FLUOROSCOPY TIME:  Fluoroscopy Time: 36 seconds Radiation Exposure Index (if provided by the fluoroscopic device): 5 mGy Number of Acquired Spot Images: 1 PROCEDURE: The risks and benefits of the procedure were discussed with the patient, and written informed consent was obtained. The patient stated no history of  allergy to contrast media. A formal timeout procedure was performed with the patient according to departmental protocol. The patient was placed supine on the fluoroscopy table and the  left intertrochanteric space was identified under fluoroscopy. The skin overlying the left intertrochanteric space was subsequently cleaned with Chloraprep and a sterile drape was placed over the area of interest. 5 ml 1% Lidocaine  was used to anesthetize the skin around the needle insertion site. A 20 gauge spinal needle was inserted into the left anterior interspace under fluoroscopy. 1 mL of thick, yellow tinged fluid aspirated and sent to lab. The needle was removed and hemostasis was achieved. IMPRESSION: Technically successful right hip aspiration. Performed by Laymon Coast, NP under the supervision of Dr. Jennefer Electronically Signed   By: Ester Jennefer M.D.   On: 08/19/2024 15:09   MR HIP LEFT W WO CONTRAST Result Date: 08/19/2024 CLINICAL DATA:  Fracture, hip Worsening left hip pain for 1 week. Abnormality of the left femoral head on CT indeterminate for fracture. EXAM: MRI OF THE LEFT HIP WITHOUT AND WITH CONTRAST TECHNIQUE: Multiplanar, multisequence MR imaging was performed both before and after administration of intravenous contrast. CONTRAST:  8mL GADAVIST GADOBUTROL 1 MMOL/ML IV SOLN COMPARISON:  Left hip radiographs 08/18/2024, 08/15/2024 and 01/29/2024. CT of the left hip 08/18/2024. FINDINGS: Technical note: Despite efforts by the technologist and patient, mild motion artifact is present on today's exam and could not be eliminated. This reduces exam sensitivity and specificity. Bones/Joint/Cartilage As shown on recent radiographs, there is osteolysis of the left femoral head which is new compared with radiographs performed 6 months ago. There is flattening of the left femoral head with heterogeneous marrow signal throughout the left femoral neck and intertrochanteric region. No acute fracture identified.  Probable degenerative reactive edema and enhancement within the left superior acetabulum. There is moderate size left hip joint effusion with associated nonspecific synovial thickening. There are moderate right hip degenerative changes with joint space narrowing, osteophytes and subchondral cyst formation. The bony pelvis and sacroiliac joints otherwise appear unremarkable. There is multilevel lower lumbar spondylosis. Ligaments Not relevant for exam/indication. Muscles and Tendons No acute musculotendinous findings are identified. There is fatty atrophy of the gluteus musculature bilaterally, greater on the left. There is chronic partial tearing of the common hamstring tendons, greater on the right. The piriformis muscles are symmetric. Soft tissue No evidence of periarticular fluid collection or foreign body. Bilateral scrotal hydroceles are noted. IMPRESSION: 1. Osteolysis of the left femoral head with flattening of the left femoral head and heterogeneous marrow signal throughout the left femoral neck and intertrochanteric region. 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 No acute fracture identified. 2. Moderate-sized left hip joint effusion with nonspecific synovial thickening. If concern for infection, consider joint aspiration. 3. Moderate right hip degenerative changes. 4. Chronic partial tearing of the common hamstring tendons, greater on the right. 5. Bilateral scrotal hydroceles. Electronically Signed   By: Elsie Perone M.D.   On: 08/19/2024 08:50   CT Lumbar Spine Wo Contrast Result Date: 08/18/2024 CLINICAL DATA:  Low back pain EXAM: CT LUMBAR SPINE WITHOUT CONTRAST TECHNIQUE: Multidetector CT imaging of the lumbar spine was performed without intravenous contrast administration. Multiplanar CT image reconstructions were also generated. RADIATION DOSE REDUCTION: This exam was performed according to the departmental dose-optimization program  which includes automated exposure control, adjustment of the mA and/or kV according to patient size and/or use of iterative reconstruction technique. COMPARISON:  Lumbar CT 08/15/2024, chest CT 08/02/2024 FINDINGS: Segmentation: Transitional anatomy, same numbering scheme as with comparison study from 08/15/2024. Transitional S1 vertebra. First non rib-bearing vertebra is designated L1. Alignment: Mild scoliosis. Grade 1 anterolisthesis  L4 on L5. Trace retrolisthesis L2-L3 and L3-L4. Vertebrae: Vertebral body heights are maintained.  No fracture Paraspinal and other soft tissues: Aortic atherosclerosis. No acute finding. Small left kidney stone. Disc levels: At L1-L2, no high-grade canal stenosis. Mild facet degenerative changes. At L2-L3, disc space narrowing and vacuum disc. No high-grade canal stenosis. Moderate facet degenerative changes. Moderate right greater than left subarticular and foraminal narrowing. At L3-L4, disc space narrowing. Diffuse disc bulge. Moderate hypertrophic facet degenerative changes. Moderate subarticular and foraminal narrowing bilaterally. At L4-L5, disc space narrowing and vacuum discs. Severe hypertrophic facet degenerative changes. Severe canal stenosis and moderate bilateral foraminal narrowing. At L5-S1, disc space narrowing and vacuum disc. Posterior decompression changes. Severe hypertrophic facet degenerative changes. Moderate bilateral foraminal narrowing. IMPRESSION: 1. Transitional anatomy, same numbering scheme as with comparison study from 08/15/2024. 2. No acute osseous abnormality. 3. Multilevel degenerative changes, most advanced at L4-L5 where there is severe canal stenosis and bilateral foraminal narrowing. 4. Aortic atherosclerosis. Aortic Atherosclerosis (ICD10-I70.0). Electronically Signed   By: Luke Bun M.D.   On: 08/18/2024 17:29   CT Hip Left Wo Contrast Result Date: 08/18/2024 CLINICAL DATA:  Left hip pain. EXAM: CT OF THE LEFT HIP WITHOUT CONTRAST  TECHNIQUE: Multidetector CT imaging of the left hip was performed according to the standard protocol. Multiplanar CT image reconstructions were also generated. RADIATION DOSE REDUCTION: This exam was performed according to the departmental dose-optimization program which includes automated exposure control, adjustment of the mA and/or kV according to patient size and/or use of iterative reconstruction technique. COMPARISON:  Left hip radiograph dated 08/18/2024. FINDINGS: Bones/Joint/Cartilage There is flattening of the left femoral head with cortical fragmentation. There is focal area of cortical angulation involving the basicervical femoral neck (coronal 73/7). An impacted acute fracture is not excluded. There is chronic appearing fragmentation of the posterior left acetabular cortex. An area of periarticular amorphous calcification at the lateral aspect of the hip joint (70/7) noted. There is thickening and irregularity of the joint capsule and synovium. Additional somewhat rounded calcifications along the anteromedial proximal femoral neck noted. There is slight lateral subluxation of the femoral head in relation to the acetabular cup. Faint linear irregularity involving the medial acetabular roof (78/7) may be chronic. An acute fracture is not excluded. The bones are osteopenic. Ligaments Suboptimally assessed by CT. Muscles and Tendons No acute findings. Soft tissues Advanced atherosclerotic calcification of the aorta. IMPRESSION: 1. Chronic deformity and flattening of the left femoral head may be related to underlying avascular necrosis or chronic infection. An impacted basicervical fracture is not excluded. Orthopedic consult and further evaluation with MRI recommended. 2. 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 3.  Aortic Atherosclerosis (ICD10-I70.0). Electronically Signed   By: Vanetta Chou M.D.   On: 08/18/2024 17:24   DG Chest 1 View Result Date:  08/18/2024 CLINICAL DATA:  Fall. EXAM: CHEST  1 VIEW COMPARISON:  08/15/2024, CT 08/02/2024 FINDINGS: Chronic volume loss in the left hemithorax. Stable heart size and mediastinal contours. Aortic atherosclerosis underlying emphysema. No pneumothorax or significant pleural effusion. None limited assessment, no acute osseous findings. IMPRESSION: 1. No acute chest findings. 2. Chronic volume loss in the left hemithorax. Electronically Signed   By: Andrea Gasman M.D.   On: 08/18/2024 16:33   DG Hip Unilat W or Wo Pelvis 2-3 Views Left Result Date: 08/18/2024 CLINICAL DATA:  Left hip pain.  Pain for over 1 week. EXAM: DG HIP (WITH OR WITHOUT PELVIS) 2-3V LEFT COMPARISON:  Radiograph 3  days ago.  Radiograph 01/29/2024 FINDINGS: Deformity of the left proximal femur and femoral head. Flattening and collapse of the femoral head with lateral subluxation of the femoral shaft and secondary osteoarthritis. Difficult to exclude superimposed fracture as there may be slight increased subluxation. Advanced right hip osteoarthritis with complete joint space loss, subchondral cystic changes and sclerosis. The pubic rami are intact. Prominent vascular calcifications. IMPRESSION: 1. Deformity of the left proximal femur and femoral head with flattening and collapse of the femoral head and secondary osteoarthritis. Difficult to exclude superimposed fracture as there may be slight increased subluxation. Significant progression from April, findings may represent progressive avascular necrosis or rapidly progressive osteoarthritis. 2. Advanced right hip osteoarthritis. Electronically Signed   By: Andrea Gasman M.D.   On: 08/18/2024 16:31   DG Chest 2 View Result Date: 08/15/2024 CLINICAL DATA:  Bilateral lower extremity swelling and worsening left hip pain. EXAM: DG CHEST 2V COMPARISON:  08/05/2024. FINDINGS: Trachea is midline. Heart size normal. Lungs are clear. No pleural fluid. Bilateral shoulder osteoarthritis, left  greater than right. IMPRESSION: No acute findings. Electronically Signed   By: Newell Eke M.D.   On: 08/15/2024 15:40   DG Hip Unilat W or Wo Pelvis 2-3 Views Left Result Date: 08/15/2024 CLINICAL DATA:  Worsening left hip pain. EXAM: DG HIP (WITH OR WITHOUT PELVIS) 2-3V LEFT COMPARISON:  01/29/2024. FINDINGS: Sclerosis and marked flattening of the left femoral head with lateral subluxation. Findings have progressed markedly from 01/29/2024. Superior joint space narrowing in the right hip with subchondral sclerosis, as on 01/29/2024. Sacroiliac joints are patent. IMPRESSION: 1. Avascular necrosis in the left femoral head with significant collapse, mild lateral subluxation and secondary osteoarthritis, largely new from 01/29/2024. 2. Right hip osteoarthritis. Electronically Signed   By: Newell Eke M.D.   On: 08/15/2024 15:39   CT Lumbar Spine Wo Contrast Result Date: 08/15/2024 EXAM: CT OF THE LUMBAR SPINE WITHOUT CONTRAST 08/15/2024 02:45:17 PM TECHNIQUE: CT of the lumbar spine was performed without the administration of intravenous contrast. Multiplanar reformatted images are provided for review. Automated exposure control, iterative reconstruction, and/or weight based adjustment of the mA/kV was utilized to reduce the radiation dose to as low as reasonably achievable. COMPARISON: None available. CLINICAL HISTORY: FINDINGS: BONES AND ALIGNMENT: Transitional anatomy is present. Partial sacralization of the L5 vertebral body is noted. This was previously labeled L5. The change in nomenclature is based on comparison with chest imaging. The grade 1 anterolisthesis is therefore at L4-L5. Slight retrolisthesis is present at L1-L2, L2-L3, and L3-L4. No acute fracture or suspicious bone lesion. Not rapid curvature centered at L2. Compensatory leftward curvature is present at L5. Endplate sclerotic changes are worse right than left at L4-L5. DEGENERATIVE CHANGES: * **L1-L2:** Disc bulging and facet  hypertrophy are stable without significant stenosis. * **L2-L3:** A broad-based disc protrusion and moderate facet hypertrophy are present. Moderate foraminal narrowing is worse on the left. * **L3-L4:** A broad-based disc protrusion and moderate facet hypertrophy are present. Moderate subarticular and foraminal narrowing is worse on the right. * **L4-L5:** A broad-based disc protrusion and advanced facet hypertrophy result in severe central and bilateral foraminal stenosis. * **L5-S1:** A broad-based disc protrusion is present. Laminectomy decompression of the posterior canal. Moderate residual foraminal stenosis is present bilaterally. * **S1:** Facet hypertrophy is present. Laminectomy decompression of the spinal canal. The foramina are patent bilaterally. SOFT TISSUES: Atherosclerotic calcifications are present in the aorta and branch vessels without aneurysm. A 13 mm cystic lesion is present near the lower  pole of the right kidney, incompletely characterized. No acute abnormality. IMPRESSION: 1. Grade 1 anterolisthesis at L4-5 with broad-based disc protrusion and advanced facet hypertrophy resulting in severe central and bilateral foraminal stenosis. 2. Broad-based disc protrusion at L5-S1 with prior laminectomy decompression and moderate residual foraminal stenosis bilaterally. 3. 13 mm cystic lesion near the lower pole of the right kidney that is incompletely characterized. Renal ultrasound may be useful for further evaluation as clinically indicated. 4. Transitional anatomy The last fully formed vertebral body is S1. This is a change from the previous MRI. Electronically signed by: Lonni Necessary MD 08/15/2024 03:01 PM EDT RP Workstation: [id number]   DG Chest Portable 1 View Result Date: 08/05/2024 CLINICAL DATA:  Shortness of breath. EXAM: PORTABLE CHEST 1 VIEW COMPARISON:  08/02/2024 FINDINGS: Lordotic positioning. The lungs are clear without focal pneumonia, edema, pneumothorax or pleural  effusion. Interstitial markings are diffusely coarsened with chronic features. The cardiopericardial silhouette is within normal limits for size. No acute bony abnormality. Telemetry leads overlie the chest. IMPRESSION: Chronic interstitial coarsening without acute cardiopulmonary findings. Electronically Signed   By: Camellia Candle M.D.   On: 08/05/2024 07:00   CT Angio Chest PE W and/or Wo Contrast Result Date: 08/02/2024 EXAM: CTA of the Chest with contrast for PE 08/02/2024 06:13:05 AM TECHNIQUE: CTA of the chest was performed after the administration of 75 mL of iohexol  (OMNIPAQUE ) 350 MG/ML injection. Multiplanar reformatted images are provided for review. MIP images are provided for review. Automated exposure control, iterative reconstruction, and/or weight based adjustment of the mA/kV was utilized to reduce the radiation dose to as low as reasonably achievable. COMPARISON: Portable chest x-ray 08/02/2024. CTA chest 06/12/2024. CLINICAL HISTORY: 78 year old male with suspected PE, high probability, ongoing SOB, and history of Stage IV COPD. FINDINGS: PULMONARY ARTERIES: Pulmonary arteries are adequately opacified for evaluation. Good pulmonary artery contrast timing respiratory motion, primarily in the lower lungs. No pulmonary artery filling defect. Main pulmonary artery is normal in caliber. MEDIASTINUM: The heart demonstrates no acute abnormality. Normal heart size. No pericardial effusion. Calcified coronary artery and aortic atherosclerosis. LYMPH NODES: No mediastinal, hilar or axillary lymphadenopathy. LUNGS AND PLEURA: Asymmetric centrilobular emphysema. Evidence of previous left upper lobectomy. Patchy new distal peribronchial ground glass opacity in the right upper lobe (series 6, images 44 and 53) appears infectious/inflammatory. Major airways remain patent. Calcified granuloma in the left lung is benign (no follow up imaging recommended). No pleural effusion or pneumothorax. UPPER ABDOMEN:  Small hiatal hernia has not significantly changed. Motion artifact in the upper abdomen. No upper abdominal pneumoperitoneum or free fluid. SOFT TISSUES AND BONES: Chronic left posterior rib fractures, chronic unhealed posterior left 8th rib fracture. No acute or suspicious osseous lesion. No acute soft tissue abnormality. IMPRESSION: 1. No pulmonary embolism. 2. Emphysema. Prior left upper lobectomy. Patchy new distal peribronchial ground-glass opacity in the right upper lobe, favor acute infectious exacerbation - Bronchopneumonia. Electronically signed by: Helayne Hurst MD 08/02/2024 06:38 AM EDT RP Workstation: [id number]   CT Head Wo Contrast Result Date: 08/02/2024 EXAM: CT HEAD WITHOUT CONTRAST 08/02/2024 06:13:05 AM TECHNIQUE: CT of the head was performed without the administration of intravenous contrast. Automated exposure control, iterative reconstruction, and/or weight based adjustment of the mA/kV was utilized to reduce the radiation dose to as low as reasonably achievable. COMPARISON: Report of brain MRI 10/12/2008 (no images available). CLINICAL HISTORY: 78 year old male. Mental status change, unknown cause. AMS. FINDINGS: BRAIN AND VENTRICLES: No acute intracranial hemorrhage identified. No evidence of acute infarct. Asymmetric extra-axial  fluid over both cerebral hemispheres appears to be normal subarachnoid space CSF. No convincing subdural collection. No intracranial mass effect or midline shift. Brain volume is within normal limits for age. Gray-white differentiation is within normal limits for age. Calcified atherosclerosis at the skull base. No suspicious intracranial vascular hyperdensity. No hydrocephalus. ORBITS: Postoperative changes to both globes. SINUSES: No acute abnormality. SOFT TISSUES AND SKULL: Bulky craniocervical junction and C1-C2 bony degeneration. No acute soft tissue abnormality. No skull fracture. IMPRESSION: 1. No acute intracranial abnormality. Negative for age  non-contrast CT appearance of the brain. Electronically signed by: Helayne Hurst MD 08/02/2024 06:32 AM EDT RP Workstation: [id number]   DG Chest Portable 1 View Result Date: 08/02/2024 EXAM: 1 VIEW(S) XRAY OF THE CHEST 08/02/2024 04:54:00 AM COMPARISON: Chest radiographs 07/01/2024 and earlier. CLINICAL HISTORY: 78 year old male with shortness of breath. FINDINGS: LINES, TUBES AND DEVICES: External tubing suspected, projecting over the medial left clavicle. LUNGS AND PLEURA: Lung volumes remain normal. No focal pulmonary opacity. No pulmonary edema. No pleural effusion. No pneumothorax. HEART AND MEDIASTINUM: No acute abnormality of the cardiac and mediastinal silhouettes. BONES AND SOFT TISSUES: Advanced glenohumeral degeneration. No acute osseous abnormality. IMPRESSION: 1. No acute cardiopulmonary abnormality. Electronically signed by: Helayne Hurst MD 08/02/2024 05:03 AM EDT RP Workstation: [id number]   Microbiology: Results for orders placed or performed during the hospital encounter of 08/18/24  Body fluid culture w Gram Stain     Status: None   Collection Time: 08/19/24 10:21 AM   Specimen: Body Fluid  Result Value Ref Range Status   Specimen Description FLUID HIP  Final   Special Requests Normal  Final   Gram Stain NO WBC SEEN NO ORGANISMS SEEN   Final   Culture   Final    NO GROWTH 3 DAYS Performed at [facility], [address], [place], [place] [address]    Report Status 08/22/2024 FINAL  Final  Surgical pcr screen     Status: None   Collection Time: 08/26/24  5:06 PM   Specimen: Nasal Mucosa; Nasal Swab  Result Value Ref Range Status   MRSA, PCR NEGATIVE NEGATIVE Final   Staphylococcus aureus NEGATIVE NEGATIVE Final    Comment: (NOTE) The Xpert SA Assay (FDA approved for NASAL specimens in patients 3 years of age and older), is one component of a comprehensive surveillance program. It is not intended to diagnose infection nor to guide or monitor treatment. Performed  at [facility], [address], [place], [place] [address]    Labs: CBC: Recent Labs  Lab 08/26/24 0345 08/29/24 0514 08/30/24 0635  WBC 7.9 13.2* 11.3*  NEUTROABS  --   --  8.1*  HGB 12.0* 9.5* 9.8*  HCT 36.9* 28.0* 29.2*  MCV 97.6 94.3 96.1  PLT 216 270 290   Basic Metabolic Panel: Recent Labs  Lab 08/25/24 0916 08/26/24 0345 08/27/24 0359 08/29/24 0514 08/30/24 0635  NA 138 138 136 136 139  K 3.3* 4.2 4.4 4.0 4.2  CL 107 100 101 101 105  CO2 25 26 25 24 24   GLUCOSE 148* 103* 108* 153* 87  BUN 15 18 11 17 13   CREATININE 0.92 1.20 1.09 1.17 0.99  CALCIUM  6.9* 8.6* 8.4* 8.3* 8.2*  MG  --   --   --   --  1.5*  PHOS  --   --   --   --  3.1   Liver Function Tests: Recent Labs  Lab 08/30/24 0635  AST 19  ALT 17  ALKPHOS 55  BILITOT 0.4  PROT 4.7*  ALBUMIN 2.4*   CBG: Recent Labs  Lab 08/30/24 1641 08/30/24 2030 08/31/24 0618 08/31/24 0824 08/31/24 1151  GLUCAP 162* 117* 122* 120* 138*   Discharge time spent: greater than 30 minutes.  Signed: Alejandro Marker, DO [facility] 08/31/2024

## 2024-08-31 NOTE — TOC Transition Note (Signed)
 Transition of Care [facility]) - Discharge Note   Patient Details  Name: Gary Wright MRN: [id number] Date of Birth: 1946/04/20  Transition [facility]) CM/SW Contact:  Bridget Cordella Simmonds, LCSW Phone Number: 08/31/2024, 1:17 PM   Clinical Narrative:   Pt discharging to [facility], room 706p. RN call report to [phone].   PTAR called [phone].   1115: CSW confirmed with [facility] that they can receive pt today.   Final next level of care: Skilled Nursing Facility Barriers to Discharge: Barriers Resolved   Patient Goals and CMS Choice            Discharge Placement              Patient chooses bed at: [facility] Patient to be transferred to facility by: ptar Name of family member notified: left message for [name] Patient and family notified of of transfer: 08/31/24  Discharge Plan and Services Additional resources added to the After Visit Summary for                                       Social Drivers of Health (SDOH) Interventions SDOH Screenings   Food Insecurity: No Food Insecurity (08/19/2024)  Housing: Low Risk  (08/19/2024)  Transportation Needs: No Transportation Needs (08/19/2024)  Utilities: Not At Risk (08/19/2024)  Alcohol Screen: Low Risk  (11/18/2023)  Depression (PHQ2-9): Medium Risk (11/18/2023)  Financial Resource Strain: Low Risk  (11/18/2023)  Physical Activity: Insufficiently Active (11/18/2023)  Social Connections: Moderately Isolated (08/19/2024)  Stress: No Stress Concern Present (11/18/2023)  Tobacco Use: Medium Risk (08/27/2024)  Health Literacy: Adequate Health Literacy (11/18/2023)     Readmission Risk Interventions     No data to display

## 2024-09-02 ENCOUNTER — Ambulatory Visit (HOSPITAL_COMMUNITY)

## 2024-09-06 ENCOUNTER — Ambulatory Visit: Admitting: Sports Medicine

## 2024-09-07 ENCOUNTER — Ambulatory Visit (HOSPITAL_COMMUNITY)

## 2024-09-08 ENCOUNTER — Encounter: Admitting: Orthopaedic Surgery

## 2024-09-08 ENCOUNTER — Telehealth: Payer: Self-pay

## 2024-09-08 NOTE — Transitions of Care (Post Inpatient/ED Visit) (Signed)
   09/08/2024  Name: Gary Wright MRN: [id number] DOB: [birth year]  Today's TOC FU Call Status: Today's TOC FU Call Status:: Unsuccessful Call (1st Attempt) Unsuccessful Call (1st Attempt) Date: 09/08/24  Attempted to reach the patient regarding the most recent Inpatient/ED visit.  Follow Up Plan: Additional outreach attempts will be made to reach the patient to complete the Transitions of Care (Post Inpatient/ED visit) call.   Signature Julian Lemmings, LPN [facility] Nurse Health Advisor Direct Dial [phone]

## 2024-09-09 ENCOUNTER — Ambulatory Visit: Payer: Self-pay | Admitting: Internal Medicine

## 2024-09-09 ENCOUNTER — Ambulatory Visit (HOSPITAL_COMMUNITY)

## 2024-09-09 ENCOUNTER — Ambulatory Visit: Admitting: Internal Medicine

## 2024-09-09 ENCOUNTER — Encounter: Payer: Self-pay | Admitting: Internal Medicine

## 2024-09-09 VITALS — BP 136/66 | HR 115 | Temp 98.5°F | Ht 68.0 in

## 2024-09-09 DIAGNOSIS — J449 Chronic obstructive pulmonary disease, unspecified: Secondary | ICD-10-CM | POA: Diagnosis not present

## 2024-09-09 DIAGNOSIS — G8918 Other acute postprocedural pain: Secondary | ICD-10-CM

## 2024-09-09 DIAGNOSIS — M25552 Pain in left hip: Secondary | ICD-10-CM

## 2024-09-09 DIAGNOSIS — M25551 Pain in right hip: Secondary | ICD-10-CM

## 2024-09-09 DIAGNOSIS — R000 Tachycardia, unspecified: Secondary | ICD-10-CM | POA: Diagnosis not present

## 2024-09-09 DIAGNOSIS — G8929 Other chronic pain: Secondary | ICD-10-CM | POA: Diagnosis not present

## 2024-09-09 DIAGNOSIS — I2510 Atherosclerotic heart disease of native coronary artery without angina pectoris: Secondary | ICD-10-CM

## 2024-09-09 LAB — CBC WITH DIFFERENTIAL/PLATELET
Basophils Absolute: 0 K/uL (ref 0.0–0.1)
Basophils Relative: 0.3 % (ref 0.0–3.0)
Eosinophils Absolute: 0.3 K/uL (ref 0.0–0.7)
Eosinophils Relative: 2.1 % (ref 0.0–5.0)
HCT: 32.1 % — ABNORMAL LOW (ref 39.0–52.0)
Hemoglobin: 10.6 g/dL — ABNORMAL LOW (ref 13.0–17.0)
Lymphocytes Relative: 14.3 % (ref 12.0–46.0)
Lymphs Abs: 1.9 K/uL (ref 0.7–4.0)
MCHC: 33.1 g/dL (ref 30.0–36.0)
MCV: 96 fl (ref 78.0–100.0)
Monocytes Absolute: 0.8 K/uL (ref 0.1–1.0)
Monocytes Relative: 6 % (ref 3.0–12.0)
Neutro Abs: 10.1 K/uL — ABNORMAL HIGH (ref 1.4–7.7)
Neutrophils Relative %: 77.3 % — ABNORMAL HIGH (ref 43.0–77.0)
Platelets: 534 K/uL — ABNORMAL HIGH (ref 150.0–400.0)
RBC: 3.34 Mil/uL — ABNORMAL LOW (ref 4.22–5.81)
RDW: 15.2 % (ref 11.5–15.5)
WBC: 13 K/uL — ABNORMAL HIGH (ref 4.0–10.5)

## 2024-09-09 LAB — COMPREHENSIVE METABOLIC PANEL WITH GFR
ALT: 13 U/L (ref 0–53)
AST: 13 U/L (ref 0–37)
Albumin: 3.8 g/dL (ref 3.5–5.2)
Alkaline Phosphatase: 86 U/L (ref 39–117)
BUN: 16 mg/dL (ref 6–23)
CO2: 27 meq/L (ref 19–32)
Calcium: 8.9 mg/dL (ref 8.4–10.5)
Chloride: 99 meq/L (ref 96–112)
Creatinine, Ser: 1.18 mg/dL (ref 0.40–1.50)
GFR: 59.29 mL/min — ABNORMAL LOW (ref >60.00)
Glucose, Bld: 130 mg/dL — ABNORMAL HIGH (ref 70–99)
Potassium: 4 meq/L (ref 3.5–5.1)
Sodium: 140 meq/L (ref 135–145)
Total Bilirubin: 0.5 mg/dL (ref 0.2–1.2)
Total Protein: 5.8 g/dL — ABNORMAL LOW (ref 6.0–8.3)

## 2024-09-09 LAB — MAGNESIUM: Magnesium: 1.1 mg/dL — ABNORMAL LOW (ref 1.5–2.5)

## 2024-09-09 LAB — PHOSPHORUS: Phosphorus: 2.9 mg/dL (ref 2.3–4.6)

## 2024-09-09 MED ORDER — TRELEGY ELLIPTA 200-62.5-25 MCG/ACT IN AEPB: 1.0000 | INHALATION_SPRAY | Freq: Every evening | RESPIRATORY_TRACT | 1 refills | Status: DC

## 2024-09-09 MED ORDER — HYDROCODONE-ACETAMINOPHEN 10-325 MG PO TABS: 1.0000 | ORAL_TABLET | ORAL | 0 refills | Status: DC | PRN

## 2024-09-09 MED ORDER — ASPIRIN 81 MG PO TBEC: 81.0000 mg | DELAYED_RELEASE_TABLET | Freq: Two times a day (BID) | ORAL | 0 refills | Status: DC

## 2024-09-09 MED ORDER — GABAPENTIN 300 MG PO CAPS: 300.0000 mg | ORAL_CAPSULE | Freq: Three times a day (TID) | ORAL | 1 refills | Status: DC

## 2024-09-09 MED ORDER — IPRATROPIUM-ALBUTEROL 0.5-2.5 (3) MG/3ML IN SOLN: 3.0000 mL | Freq: Four times a day (QID) | RESPIRATORY_TRACT | 5 refills | Status: AC | PRN

## 2024-09-09 MED ORDER — AIRSUPRA 90-80 MCG/ACT IN AERO: 2.0000 | INHALATION_SPRAY | Freq: Four times a day (QID) | RESPIRATORY_TRACT | 1 refills | Status: DC | PRN

## 2024-09-09 NOTE — Progress Notes (Addendum)
 ==============================  [facility]: [phone]   -- Medical Office Visit --  Patient: Gary Wright      Age: 78 y.o.       Sex:  male  Date:   09/09/2024 Today's Healthcare Provider: Bernardino KANDICE Cone, MD  ==============================   Chief Complaint: Hospitalization Follow-up (Pt was in hospital for 10 days only been home for little been I rehab after hospital. Sob with long distance walks and all neb treatments all the time thru out the day now .)  Discussed the use of AI scribe software for clinical note transcription with the patient, who gave verbal consent to proceed. History of Present Illness has Depression; Essential hypertension; TONSILLECTOMY, HX OF; Stress-induced cardiomyopathy; History of pneumonia; Opioid dependence (HCC); Mild CAD; COPD with acute exacerbation (HCC); History of lung cancer; Hyperlipidemia; Acute respiratory failure with hypoxia (HCC); Hypocalcemia; Borderline type 2 diabetes mellitus; Allergic rhinitis; Former smoker; Allergic asthma; Chronic kidney disease, stage 3b (HCC); Fungal skin infection; Asthma-COPD overlap syndrome (HCC); Drug-induced diabetes mellitus; Insomnia due to medical condition; Hoarseness; Chronic hip pain, bilateral; SOB (shortness of breath); Pneumonia of right upper lobe due to infectious organism; Leukocytosis; History of bacteremia; and Avascular necrosis of bone of left hip (HCC) on their problem list.   Gary Wright is a 78 year old male with COPD who presents with uncontrolled hip pain following a recent hip replacement. He is accompanied by Lorn, his caregiver.  He experiences uncontrolled hip pain following a hip replacement surgery performed approximately two and a half weeks ago. The pain is localized at the hip and radiates down to the mid-thigh, described as severe with a sensation of numbness. It worsens with weight-bearing activities, such as using a walker,  which he uses to aid mobility. Tramadol  has been ineffective in managing the pain. Previously, he was prescribed hydrocodone  10 mg every four hours during his stay at a rehabilitation facility, which he found helpful.  He has a history of COPD and experiences chronic shortness of breath, managed with nebulizer treatments and a rescue inhaler. He uses his nebulizer six times a day, which helps significantly. He also takes Dupuytix shots for his COPD every fourteen days. His shortness of breath is a chronic issue and not new.  He is not currently on any blood thinners, although he used to take baby aspirin . He did not receive any blood thinners post-surgery. No new leg pain or increased shortness of breath.  His appetite remains unaffected. He has been participating in physical therapy at a rehabilitation center, where he was shown how to manage daily activities such as climbing stairs.   Background Reviewed: Problem List: has Depression; Essential hypertension; TONSILLECTOMY, HX OF; Stress-induced cardiomyopathy; History of pneumonia; Opioid dependence (HCC); Mild CAD; COPD with acute exacerbation (HCC); History of lung cancer; Hyperlipidemia; Acute respiratory failure with hypoxia (HCC); Hypocalcemia; Borderline type 2 diabetes mellitus; Allergic rhinitis; Former smoker; Allergic asthma; Chronic kidney disease, stage 3b (HCC); Fungal skin infection; Asthma-COPD overlap syndrome (HCC); Drug-induced diabetes mellitus; Insomnia due to medical condition; Hoarseness; Chronic hip pain, bilateral; SOB (shortness of breath); Pneumonia of right upper lobe due to infectious organism; Leukocytosis; History of bacteremia; and Avascular necrosis of bone of left hip (HCC) on their problem list. Past Medical History:  has a past medical history of Acute dyspnea (03/24/2022), Acute exacerbation of chronic obstructive pulmonary disease (COPD) (HCC) (03/25/2022), Asthma, CAD (coronary artery disease) of artery bypass graft  (06/13/2016), Chest pain, COPD (chronic obstructive  pulmonary disease) (HCC), COPD with acute exacerbation (HCC) (11/02/2020), Depression (09/01/2007), Elevated troponin (06/13/2016), History of lung cancer (11/02/2020), Hyperlipidemia (11/02/2020), Hypertension, Prolonged QT interval (11/02/2020), Stress-induced cardiomyopathy (06/13/2016), and TOBACCO ABUSE (09/01/2007). Past Surgical History:   has a past surgical history that includes Tonsillectomy; Cardiac catheterization (N/A, 06/13/2016); Back surgery; Inguinal hernia repair (Right, 04/21/2018); Insertion of mesh (Right, 04/21/2018); Lung removal, partial (Left); and Total hip arthroplasty (Left, 08/27/2024). Social History:   reports that he quit smoking about 6 years ago. His smoking use included cigarettes. He has never used smokeless tobacco. He reports that he does not drink alcohol and does not use drugs. Family History:  family history includes Hypertension in his father and mother. Allergies:  has no known allergies.   Medication Reconciliation: Current Outpatient Medications on File Prior to Visit  Medication Sig   acetaminophen  (TYLENOL ) 325 MG tablet Take 1-2 tablets (325-650 mg total) by mouth every 6 (six) hours as needed for mild pain (pain score 1-3) or fever (or temp > 100.5).   albuterol  (PROVENTIL ) (2.5 MG/3ML) 0.083% nebulizer solution Take 3 mLs (2.5 mg total) by nebulization every 4 (four) hours as needed for wheezing or shortness of breath.   atorvastatin  (LIPITOR) 40 MG tablet TAKE 1 TABLET(40 MG) BY MOUTH DAILY AT 6 PM   buPROPion  (WELLBUTRIN  XL) 300 MG 24 hr tablet Take 300 mg by mouth daily.   citalopram  (CELEXA ) 40 MG tablet Take 40 mg by mouth daily.   cyclobenzaprine  (FLEXERIL ) 5 MG tablet Take 1 tablet (5 mg total) by mouth 3 (three) times daily as needed for muscle spasms.   diclofenac  Sodium (VOLTAREN ) 1 % GEL Apply 4 g topically 4 (four) times daily as needed. Rub on sore joints.   Dupilumab  (DUPIXENT ) 300  MG/2ML SOAJ Inject 300 mg into the skin every 14 (fourteen) days.   furosemide  (LASIX ) 20 MG tablet Take 1 tablet (20 mg total) by mouth daily.   glipiZIDE  (GLUCOTROL  XL) 2.5 MG 24 hr tablet Take 1 tablet (2.5 mg total) by mouth as directed. Take 2 pills in the morning and 1 pill in the evening.   ipratropium-albuterol  (DUONEB) 0.5-2.5 (3) MG/3ML SOLN Take 3 mLs by nebulization 4 (four) times daily.   metoprolol  succinate (TOPROL -XL) 25 MG 24 hr tablet Take 0.5 tablets (12.5 mg total) by mouth See admin instructions. Take 1/2 tablet (12.5mg ) by mouth once daily at noon.   olmesartan  (BENICAR ) 40 MG tablet Take 1 tablet (40 mg total) by mouth daily.   ondansetron  (ZOFRAN ) 4 MG tablet Take 1 tablet (4 mg total) by mouth every 6 (six) hours as needed for nausea.   oxyCODONE -acetaminophen  (PERCOCET) 5-325 MG tablet Take 1-2 tablets by mouth 2 (two) times daily as needed for severe pain (pain score 7-10).   polyethylene glycol (MIRALAX  / GLYCOLAX ) 17 g packet Take 17 g by mouth daily as needed for moderate constipation.   senna-docusate (SENOKOT-S) 8.6-50 MG tablet Take 1 tablet by mouth at bedtime as needed for mild constipation.   pantoprazole  (PROTONIX ) 40 MG tablet One tablet by mouth daily 30 to 60 minutes before the first meal of the day (Patient taking differently: Take 40 mg by mouth daily as needed (heartburn, acid reflux).)   triamcinolone  ointment (KENALOG ) 0.5 % Apply topically 2 (two) times daily. (Patient taking differently: Apply 1 Application topically 2 (two) times daily as needed (skin irritaiton).)   No current facility-administered medications on file prior to visit.   Medications Discontinued During This Encounter  Medication Reason  budesonide  (PULMICORT ) 0.5 MG/2ML nebulizer solution    albuterol  (VENTOLIN  HFA) 108 (90 Base) MCG/ACT inhaler    Fluticasone -Umeclidin-Vilant (TRELEGY ELLIPTA ) 200-62.5-25 MCG/ACT AEPB Reorder   aspirin  EC 81 MG tablet Reorder    HYDROcodone -acetaminophen  (NORCO) 10-325 MG tablet      Physical Exam:    09/09/2024   10:35 AM 08/31/2024    8:00 AM 08/31/2024    5:07 AM  Vitals with BMI  Height 5' 8    Systolic 136 124 864  Diastolic 66 71 73  Pulse 115 96 97  Vital signs reviewed.  Nursing notes reviewed. Weight trend reviewed. Physical Activity: Insufficiently Active (11/18/2023)   Exercise Vital Sign    Days of Exercise per Week: 3 days    Minutes of Exercise per Session: 10 min   General Appearance:  No acute distress appreciable.   Well-groomed, healthy-appearing male.  Well proportioned with no abnormal fat distribution.  Good muscle tone. Pulmonary:  Normal work of breathing at rest, no respiratory distress apparent. SpO2: 98 %  Musculoskeletal: All extremities are intact.  Neurological:  Awake, alert, oriented, and engaged.  No obvious focal neurological deficits or cognitive impairments.  Sensorium seems unclouded.   Speech is clear and coherent with logical content. Psychiatric:  Appropriate mood, pleasant and cooperative demeanor, thoughtful and engaged during the exam  Physical Exam Skin:        Comments: Pain that feels like numbness since hip surgery 2 weeks ago and. Pain is severe especially with weight bearing.  Tramadol  doesn't help at all.     Verbalized to patient: Physical Exam EXTREMITIES: No tenderness in lower extremities. MUSCULOSKELETAL: Unable to stand without assistance; hip pain and numbness.   Results:   Verbalized to patient: Results      11/18/2023   10:57 AM 01/29/2023    1:24 PM  PHQ 2/9 Scores  PHQ - 2 Score 2 1  PHQ- 9 Score 5  5      Data saved with a previous flowsheet row definition     Office Visit on 09/09/2024  Component Date Value Ref Range Status   WBC 09/09/2024 13.0 (H)  4.0 - 10.5 K/uL Final   RBC 09/09/2024 3.34 (L)  4.22 - 5.81 Mil/uL Final   Hemoglobin 09/09/2024 10.6 (L)  13.0 - 17.0 g/dL Final   HCT 88/79/7974 32.1 (L)  39.0 - 52.0 %  Final   MCV 09/09/2024 96.0  78.0 - 100.0 fl Final   MCHC 09/09/2024 33.1  30.0 - 36.0 g/dL Final   RDW 88/79/7974 15.2  11.5 - 15.5 % Final   Platelets 09/09/2024 534.0 (H)  150.0 - 400.0 K/uL Final   Neutrophils Relative % 09/09/2024 77.3 (H)  43.0 - 77.0 % Final   Lymphocytes Relative 09/09/2024 14.3  12.0 - 46.0 % Final   Monocytes Relative 09/09/2024 6.0  3.0 - 12.0 % Final   Eosinophils Relative 09/09/2024 2.1  0.0 - 5.0 % Final   Basophils Relative 09/09/2024 0.3  0.0 - 3.0 % Final   Neutro Abs 09/09/2024 10.1 (H)  1.4 - 7.7 K/uL Final   Lymphs Abs 09/09/2024 1.9  0.7 - 4.0 K/uL Final   Monocytes Absolute 09/09/2024 0.8  0.1 - 1.0 K/uL Final   Eosinophils Absolute 09/09/2024 0.3  0.0 - 0.7 K/uL Final   Basophils Absolute 09/09/2024 0.0  0.0 - 0.1 K/uL Final   Sodium 09/09/2024 140  135 - 145 mEq/L Final   Potassium 09/09/2024 4.0  3.5 - 5.1 mEq/L Final  Chloride 09/09/2024 99  96 - 112 mEq/L Final   CO2 09/09/2024 27  19 - 32 mEq/L Final   Glucose, Bld 09/09/2024 130 (H)  70 - 99 mg/dL Final   BUN 88/79/7974 16  6 - 23 mg/dL Final   Creatinine, Ser 09/09/2024 1.18  0.40 - 1.50 mg/dL Final   Total Bilirubin 09/09/2024 0.5  0.2 - 1.2 mg/dL Final   Alkaline Phosphatase 09/09/2024 86  39 - 117 U/L Final   AST 09/09/2024 13  0 - 37 U/L Final   ALT 09/09/2024 13  0 - 53 U/L Final   Total Protein 09/09/2024 5.8 (L)  6.0 - 8.3 g/dL Final   Albumin 88/79/7974 3.8  3.5 - 5.2 g/dL Final   GFR 88/79/7974 59.29 (L)  >60.00 mL/min Final   Calcium  09/09/2024 8.9  8.4 - 10.5 mg/dL Final   Magnesium  09/09/2024 1.1 (L)  1.5 - 2.5 mg/dL Final   Phosphorus 88/79/7974 2.9  2.3 - 4.6 mg/dL Final  No results displayed because visit has over 200 results.    Admission on 08/15/2024, Discharged on 08/15/2024  Component Date Value Ref Range Status   WBC 08/15/2024 13.7 (H)  4.0 - 10.5 K/uL Final   RBC 08/15/2024 4.28  4.22 - 5.81 MIL/uL Final   Hemoglobin 08/15/2024 13.5  13.0 - 17.0 g/dL Final    HCT 89/73/7974 41.3  39.0 - 52.0 % Final   MCV 08/15/2024 96.5  80.0 - 100.0 fL Final   MCH 08/15/2024 31.5  26.0 - 34.0 pg Final   MCHC 08/15/2024 32.7  30.0 - 36.0 g/dL Final   RDW 89/73/7974 14.0  11.5 - 15.5 % Final   Platelets 08/15/2024 368  150 - 400 K/uL Final   nRBC 08/15/2024 0.0  0.0 - 0.2 % Final   Neutrophils Relative % 08/15/2024 93  % Final   Neutro Abs 08/15/2024 12.7 (H)  1.7 - 7.7 K/uL Final   Lymphocytes Relative 08/15/2024 3  % Final   Lymphs Abs 08/15/2024 0.4 (L)  0.7 - 4.0 K/uL Final   Monocytes Relative 08/15/2024 3  % Final   Monocytes Absolute 08/15/2024 0.4  0.1 - 1.0 K/uL Final   Eosinophils Relative 08/15/2024 0  % Final   Eosinophils Absolute 08/15/2024 0.0  0.0 - 0.5 K/uL Final   Basophils Relative 08/15/2024 0  % Final   Basophils Absolute 08/15/2024 0.0  0.0 - 0.1 K/uL Final   Immature Granulocytes 08/15/2024 1  % Final   Abs Immature Granulocytes 08/15/2024 0.15 (H)  0.00 - 0.07 K/uL Final   Sodium 08/15/2024 134 (L)  135 - 145 mmol/L Final   Potassium 08/15/2024 4.6  3.5 - 5.1 mmol/L Final   Chloride 08/15/2024 101  98 - 111 mmol/L Final   CO2 08/15/2024 21 (L)  22 - 32 mmol/L Final   Glucose, Bld 08/15/2024 216 (H)  70 - 99 mg/dL Final   BUN 89/73/7974 30 (H)  8 - 23 mg/dL Final   Creatinine, Ser 08/15/2024 1.00  0.61 - 1.24 mg/dL Final   Calcium  08/15/2024 8.9  8.9 - 10.3 mg/dL Final   Total Protein 89/73/7974 6.0 (L)  6.5 - 8.1 g/dL Final   Albumin 89/73/7974 3.4 (L)  3.5 - 5.0 g/dL Final   AST 89/73/7974 18  15 - 41 U/L Final   ALT 08/15/2024 27  0 - 44 U/L Final   Alkaline Phosphatase 08/15/2024 78  38 - 126 U/L Final   Total Bilirubin 08/15/2024 0.7  0.0 -  1.2 mg/dL Final   GFR, Estimated 08/15/2024 >60  >60 mL/min Final   Anion gap 08/15/2024 12  5 - 15 Final   B Natriuretic Peptide 08/15/2024 51.2  0.0 - 100.0 pg/mL Final  Admission on 08/05/2024, Discharged on 08/06/2024  Component Date Value Ref Range Status   Sodium 08/05/2024 137   135 - 145 mmol/L Final   Potassium 08/05/2024 4.6  3.5 - 5.1 mmol/L Final   Chloride 08/05/2024 101  98 - 111 mmol/L Final   CO2 08/05/2024 24  22 - 32 mmol/L Final   Glucose, Bld 08/05/2024 199 (H)  70 - 99 mg/dL Final   BUN 89/83/7974 26 (H)  8 - 23 mg/dL Final   Creatinine, Ser 08/05/2024 1.39 (H)  0.61 - 1.24 mg/dL Final   Calcium  08/05/2024 8.7 (L)  8.9 - 10.3 mg/dL Final   GFR, Estimated 08/05/2024 52 (L)  >60 mL/min Final   Anion gap 08/05/2024 12  5 - 15 Final   WBC 08/05/2024 13.9 (H)  4.0 - 10.5 K/uL Final   RBC 08/05/2024 4.34  4.22 - 5.81 MIL/uL Final   Hemoglobin 08/05/2024 13.7  13.0 - 17.0 g/dL Final   HCT 89/83/7974 42.3  39.0 - 52.0 % Final   MCV 08/05/2024 97.5  80.0 - 100.0 fL Final   MCH 08/05/2024 31.6  26.0 - 34.0 pg Final   MCHC 08/05/2024 32.4  30.0 - 36.0 g/dL Final   RDW 89/83/7974 13.4  11.5 - 15.5 % Final   Platelets 08/05/2024 315  150 - 400 K/uL Final   nRBC 08/05/2024 0.0  0.0 - 0.2 % Final   Neutrophils Relative % 08/05/2024 82  % Final   Neutro Abs 08/05/2024 11.3 (H)  1.7 - 7.7 K/uL Final   Lymphocytes Relative 08/05/2024 13  % Final   Lymphs Abs 08/05/2024 1.9  0.7 - 4.0 K/uL Final   Monocytes Relative 08/05/2024 4  % Final   Monocytes Absolute 08/05/2024 0.6  0.1 - 1.0 K/uL Final   Eosinophils Relative 08/05/2024 0  % Final   Eosinophils Absolute 08/05/2024 0.0  0.0 - 0.5 K/uL Final   Basophils Relative 08/05/2024 0  % Final   Basophils Absolute 08/05/2024 0.0  0.0 - 0.1 K/uL Final   Immature Granulocytes 08/05/2024 1  % Final   Abs Immature Granulocytes 08/05/2024 0.08 (H)  0.00 - 0.07 K/uL Final   B Natriuretic Peptide 08/05/2024 154.7 (H)  0.0 - 100.0 pg/mL Final   SARS Coronavirus 2 by RT PCR 08/05/2024 NEGATIVE  NEGATIVE Final   Influenza A by PCR 08/05/2024 NEGATIVE  NEGATIVE Final   Influenza B by PCR 08/05/2024 NEGATIVE  NEGATIVE Final   Resp Syncytial Virus by PCR 08/05/2024 NEGATIVE  NEGATIVE Final   pH, Ven 08/05/2024 7.446 (H)  7.25  - 7.43 Final   pCO2, Ven 08/05/2024 41.6 (L)  44 - 60 mmHg Final   pO2, Ven 08/05/2024 24 (LL)  32 - 45 mmHg Final   Bicarbonate 08/05/2024 28.6 (H)  20.0 - 28.0 mmol/L Final   TCO2 08/05/2024 30  22 - 32 mmol/L Final   O2 Saturation 08/05/2024 44  % Final   Acid-Base Excess 08/05/2024 4.0 (H)  0.0 - 2.0 mmol/L Final   Sodium 08/05/2024 136  135 - 145 mmol/L Final   Potassium 08/05/2024 4.5  3.5 - 5.1 mmol/L Final   Calcium , Ion 08/05/2024 1.10 (L)  1.15 - 1.40 mmol/L Final   HCT 08/05/2024 41.0  39.0 - 52.0 % Final  Hemoglobin 08/05/2024 13.9  13.0 - 17.0 g/dL Final   Sample type 89/83/7974 VENOUS   Final   Comment 08/05/2024 NOTIFIED PHYSICIAN   Final   Troponin I (High Sensitivity) 08/05/2024 12  <18 ng/L Final   Troponin I (High Sensitivity) 08/05/2024 12  <18 ng/L Final   Procalcitonin 08/05/2024 0.50  ng/mL Final   Glucose-Capillary 08/05/2024 296 (H)  70 - 99 mg/dL Final   Comment 1 89/83/7974 Notify RN   Final   WBC 08/06/2024 13.1 (H)  4.0 - 10.5 K/uL Final   RBC 08/06/2024 3.63 (L)  4.22 - 5.81 MIL/uL Final   Hemoglobin 08/06/2024 11.5 (L)  13.0 - 17.0 g/dL Final   HCT 89/82/7974 34.5 (L)  39.0 - 52.0 % Final   MCV 08/06/2024 95.0  80.0 - 100.0 fL Final   MCH 08/06/2024 31.7  26.0 - 34.0 pg Final   MCHC 08/06/2024 33.3  30.0 - 36.0 g/dL Final   RDW 89/82/7974 13.5  11.5 - 15.5 % Final   Platelets 08/06/2024 263  150 - 400 K/uL Final   nRBC 08/06/2024 0.0  0.0 - 0.2 % Final   Sodium 08/06/2024 136  135 - 145 mmol/L Final   Potassium 08/06/2024 4.5  3.5 - 5.1 mmol/L Final   Chloride 08/06/2024 100  98 - 111 mmol/L Final   CO2 08/06/2024 26  22 - 32 mmol/L Final   Glucose, Bld 08/06/2024 182 (H)  70 - 99 mg/dL Final   BUN 89/82/7974 24 (H)  8 - 23 mg/dL Final   Creatinine, Ser 08/06/2024 1.34 (H)  0.61 - 1.24 mg/dL Final   Calcium  08/06/2024 8.5 (L)  8.9 - 10.3 mg/dL Final   GFR, Estimated 08/06/2024 55 (L)  >60 mL/min Final   Anion gap 08/06/2024 10  5 - 15 Final    Glucose-Capillary 08/05/2024 235 (H)  70 - 99 mg/dL Final   Glucose-Capillary 08/05/2024 164 (H)  70 - 99 mg/dL Final   Glucose-Capillary 08/06/2024 209 (H)  70 - 99 mg/dL Final   Glucose-Capillary 08/06/2024 161 (H)  70 - 99 mg/dL Final  Admission on 89/86/7974, Discharged on 08/04/2024  Component Date Value Ref Range Status   WBC 08/02/2024 14.5 (H)  4.0 - 10.5 K/uL Final   RBC 08/02/2024 3.22 (L)  4.22 - 5.81 MIL/uL Final   Hemoglobin 08/02/2024 10.5 (L)  13.0 - 17.0 g/dL Final   HCT 89/86/7974 32.0 (L)  39.0 - 52.0 % Final   MCV 08/02/2024 99.4  80.0 - 100.0 fL Final   MCH 08/02/2024 32.6  26.0 - 34.0 pg Final   MCHC 08/02/2024 32.8  30.0 - 36.0 g/dL Final   RDW 89/86/7974 13.6  11.5 - 15.5 % Final   Platelets 08/02/2024 157  150 - 400 K/uL Final   nRBC 08/02/2024 0.0  0.0 - 0.2 % Final   Sodium 08/02/2024 135  135 - 145 mmol/L Final   Potassium 08/02/2024 4.1  3.5 - 5.1 mmol/L Final   Chloride 08/02/2024 101  98 - 111 mmol/L Final   CO2 08/02/2024 20 (L)  22 - 32 mmol/L Final   Glucose, Bld 08/02/2024 228 (H)  70 - 99 mg/dL Final   BUN 89/86/7974 22  8 - 23 mg/dL Final   Creatinine, Ser 08/02/2024 1.32 (H)  0.61 - 1.24 mg/dL Final   Calcium  08/02/2024 8.7 (L)  8.9 - 10.3 mg/dL Final   Total Protein 89/86/7974 5.9 (L)  6.5 - 8.1 g/dL Final   Albumin 89/86/7974 3.2 (  L)  3.5 - 5.0 g/dL Final   AST 89/86/7974 16  15 - 41 U/L Final   ALT 08/02/2024 22  0 - 44 U/L Final   Alkaline Phosphatase 08/02/2024 82  38 - 126 U/L Final   Total Bilirubin 08/02/2024 1.0  0.0 - 1.2 mg/dL Final   GFR, Estimated 08/02/2024 56 (L)  >60 mL/min Final   Anion gap 08/02/2024 14  5 - 15 Final   Lipase 08/02/2024 19  11 - 51 U/L Final   pH, Ven 08/02/2024 7.498 (H)  7.25 - 7.43 Final   pCO2, Ven 08/02/2024 30.2 (L)  44 - 60 mmHg Final   pO2, Ven 08/02/2024 33  32 - 45 mmHg Final   Bicarbonate 08/02/2024 23.4  20.0 - 28.0 mmol/L Final   TCO2 08/02/2024 24  22 - 32 mmol/L Final   O2 Saturation  08/02/2024 71  % Final   Acid-Base Excess 08/02/2024 1.0  0.0 - 2.0 mmol/L Final   Sodium 08/02/2024 136  135 - 145 mmol/L Final   Potassium 08/02/2024 4.1  3.5 - 5.1 mmol/L Final   Calcium , Ion 08/02/2024 1.10 (L)  1.15 - 1.40 mmol/L Final   HCT 08/02/2024 41.0  39.0 - 52.0 % Final   Hemoglobin 08/02/2024 13.9  13.0 - 17.0 g/dL Final   Sample type 89/86/7974 VENOUS   Final   Comment 08/02/2024 NOTIFIED PHYSICIAN   Final   Lactic Acid, Venous 08/02/2024 2.3 (HH)  0.5 - 1.9 mmol/L Final   Comment 08/02/2024 NOTIFIED PHYSICIAN   Final   Troponin I (High Sensitivity) 08/02/2024 14  <18 ng/L Final   B Natriuretic Peptide 08/02/2024 52.0  0.0 - 100.0 pg/mL Final   SARS Coronavirus 2 by RT PCR 08/02/2024 NEGATIVE  NEGATIVE Final   Influenza A by PCR 08/02/2024 NEGATIVE  NEGATIVE Final   Influenza B by PCR 08/02/2024 NEGATIVE  NEGATIVE Final   Resp Syncytial Virus by PCR 08/02/2024 NEGATIVE  NEGATIVE Final   Specimen Description 08/02/2024 BLOOD LEFT ARM   Final   Special Requests 08/02/2024 BOTTLES DRAWN AEROBIC AND ANAEROBIC Blood Culture adequate volume   Final   Culture  Setup Time 08/02/2024    Final                   Value:GRAM NEGATIVE RODS IN BOTH AEROBIC AND ANAEROBIC BOTTLES CRITICAL VALUE NOTED.  VALUE IS CONSISTENT WITH PREVIOUSLY REPORTED AND CALLED VALUE.    Culture 08/02/2024  (A)   Final                   Value:ESCHERICHIA COLI SUSCEPTIBILITIES PERFORMED ON PREVIOUS CULTURE WITHIN THE LAST 5 DAYS. Performed at [facility], [address], [place], [place] [address]    Report Status 08/02/2024 08/05/2024 FINAL   Final   Specimen Description 08/02/2024 BLOOD RIGHT ARM   Final   Special Requests 08/02/2024 BOTTLES DRAWN AEROBIC AND ANAEROBIC Blood Culture adequate volume   Final   Culture  Setup Time 08/02/2024    Final                   Value:GRAM NEGATIVE RODS IN BOTH AEROBIC AND ANAEROBIC BOTTLES CRITICAL RESULT CALLED TO, READ BACK BY AND VERIFIED WITH: PHARMD K.  HINDMAN [id number] @ 2013 FH Performed at [facility], [address], [place], [place] [address]    Culture 08/02/2024 ESCHERICHIA COLI (A)   Final   Report Status 08/02/2024 08/04/2024 FINAL   Final  Organism ID, Bacteria 08/02/2024 ESCHERICHIA COLI   Final   Lactic Acid, Venous 08/02/2024 1.9  0.5 - 1.9 mmol/L Final   Troponin I (High Sensitivity) 08/02/2024 15  <18 ng/L Final   Glucose-Capillary 08/02/2024 321 (H)  70 - 99 mg/dL Final   Sodium 89/85/7974 136  135 - 145 mmol/L Final   Potassium 08/03/2024 4.3  3.5 - 5.1 mmol/L Final   Chloride 08/03/2024 104  98 - 111 mmol/L Final   CO2 08/03/2024 23  22 - 32 mmol/L Final   Glucose, Bld 08/03/2024 238 (H)  70 - 99 mg/dL Final   BUN 89/85/7974 23  8 - 23 mg/dL Final   Creatinine, Ser 08/03/2024 1.18  0.61 - 1.24 mg/dL Final   Calcium  08/03/2024 8.3 (L)  8.9 - 10.3 mg/dL Final   GFR, Estimated 08/03/2024 >60  >60 mL/min Final   Anion gap 08/03/2024 9  5 - 15 Final   WBC 08/03/2024 15.8 (H)  4.0 - 10.5 K/uL Final   RBC 08/03/2024 3.59 (L)  4.22 - 5.81 MIL/uL Final   Hemoglobin 08/03/2024 11.4 (L)  13.0 - 17.0 g/dL Final   HCT 89/85/7974 34.5 (L)  39.0 - 52.0 % Final   MCV 08/03/2024 96.1  80.0 - 100.0 fL Final   MCH 08/03/2024 31.8  26.0 - 34.0 pg Final   MCHC 08/03/2024 33.0  30.0 - 36.0 g/dL Final   RDW 89/85/7974 13.5  11.5 - 15.5 % Final   Platelets 08/03/2024 250  150 - 400 K/uL Final   nRBC 08/03/2024 0.0  0.0 - 0.2 % Final   Enterococcus faecalis 08/02/2024 NOT DETECTED  NOT DETECTED Final   Enterococcus Faecium 08/02/2024 NOT DETECTED  NOT DETECTED Final   Listeria monocytogenes 08/02/2024 NOT DETECTED  NOT DETECTED Final   Staphylococcus species 08/02/2024 NOT DETECTED  NOT DETECTED Final   Staphylococcus aureus (BCID) 08/02/2024 NOT DETECTED  NOT DETECTED Final   Staphylococcus epidermidis 08/02/2024 NOT DETECTED  NOT DETECTED Final   Staphylococcus lugdunensis 08/02/2024 NOT DETECTED  NOT DETECTED Final    Streptococcus species 08/02/2024 NOT DETECTED  NOT DETECTED Final   Streptococcus agalactiae 08/02/2024 NOT DETECTED  NOT DETECTED Final   Streptococcus pneumoniae 08/02/2024 NOT DETECTED  NOT DETECTED Final   Streptococcus pyogenes 08/02/2024 NOT DETECTED  NOT DETECTED Final   A.calcoaceticus-baumannii 08/02/2024 NOT DETECTED  NOT DETECTED Final   Bacteroides fragilis 08/02/2024 NOT DETECTED  NOT DETECTED Final   Enterobacterales 08/02/2024 DETECTED (A)  NOT DETECTED Final   Enterobacter cloacae complex 08/02/2024 NOT DETECTED  NOT DETECTED Final   Escherichia coli 08/02/2024 DETECTED (A)  NOT DETECTED Final   Klebsiella aerogenes 08/02/2024 NOT DETECTED  NOT DETECTED Final   Klebsiella oxytoca 08/02/2024 NOT DETECTED  NOT DETECTED Final   Klebsiella pneumoniae 08/02/2024 NOT DETECTED  NOT DETECTED Final   Proteus species 08/02/2024 NOT DETECTED  NOT DETECTED Final   Salmonella species 08/02/2024 NOT DETECTED  NOT DETECTED Final   Serratia marcescens 08/02/2024 NOT DETECTED  NOT DETECTED Final   Haemophilus influenzae 08/02/2024 NOT DETECTED  NOT DETECTED Final   Neisseria meningitidis 08/02/2024 NOT DETECTED  NOT DETECTED Final   Pseudomonas aeruginosa 08/02/2024 NOT DETECTED  NOT DETECTED Final   Stenotrophomonas maltophilia 08/02/2024 NOT DETECTED  NOT DETECTED Final   Candida albicans 08/02/2024 NOT DETECTED  NOT DETECTED Final   Candida auris 08/02/2024 NOT DETECTED  NOT DETECTED Final   Candida glabrata 08/02/2024 NOT DETECTED  NOT DETECTED Final   Candida krusei 08/02/2024 NOT DETECTED  NOT DETECTED Final   Candida parapsilosis 08/02/2024 NOT DETECTED  NOT DETECTED Final   Candida tropicalis 08/02/2024 NOT DETECTED  NOT DETECTED Final   Cryptococcus neoformans/gattii 08/02/2024 NOT DETECTED  NOT DETECTED Final   CTX-M ESBL 08/02/2024 NOT DETECTED  NOT DETECTED Final   Carbapenem resistance IMP 08/02/2024 NOT DETECTED  NOT DETECTED Final   Carbapenem resistance KPC 08/02/2024 NOT  DETECTED  NOT DETECTED Final   Carbapenem resistance NDM 08/02/2024 NOT DETECTED  NOT DETECTED Final   Carbapenem resist OXA 48 LIKE 08/02/2024 NOT DETECTED  NOT DETECTED Final   Carbapenem resistance VIM 08/02/2024 NOT DETECTED  NOT DETECTED Final   Glucose-Capillary 08/02/2024 208 (H)  70 - 99 mg/dL Final   Glucose-Capillary 08/03/2024 212 (H)  70 - 99 mg/dL Final   Color, Urine 89/85/7974 YELLOW  YELLOW Final   APPearance 08/03/2024 HAZY (A)  CLEAR Final   Specific Gravity, Urine 08/03/2024 1.023  1.005 - 1.030 Final   pH 08/03/2024 5.0  5.0 - 8.0 Final   Glucose, UA 08/03/2024 150 (A)  NEGATIVE mg/dL Final   Hgb urine dipstick 08/03/2024 MODERATE (A)  NEGATIVE Final   Bilirubin Urine 08/03/2024 NEGATIVE  NEGATIVE Final   Ketones, ur 08/03/2024 NEGATIVE  NEGATIVE mg/dL Final   Protein, ur 89/85/7974 30 (A)  NEGATIVE mg/dL Final   Nitrite 89/85/7974 NEGATIVE  NEGATIVE Final   Leukocytes,Ua 08/03/2024 SMALL (A)  NEGATIVE Final   RBC / HPF 08/03/2024 6-10  0 - 5 RBC/hpf Final   WBC, UA 08/03/2024 21-50  0 - 5 WBC/hpf Final   Bacteria, UA 08/03/2024 NONE SEEN  NONE SEEN Final   Squamous Epithelial / HPF 08/03/2024 0-5  0 - 5 /HPF Final   Mucus 08/03/2024 PRESENT   Final   Amorphous Crystal 08/03/2024 PRESENT   Final   Glucose-Capillary 08/03/2024 244 (H)  70 - 99 mg/dL Final   Glucose-Capillary 08/03/2024 218 (H)  70 - 99 mg/dL Final   Sodium 89/84/7974 136  135 - 145 mmol/L Final   Potassium 08/04/2024 3.7  3.5 - 5.1 mmol/L Final   Chloride 08/04/2024 101  98 - 111 mmol/L Final   CO2 08/04/2024 26  22 - 32 mmol/L Final   Glucose, Bld 08/04/2024 141 (H)  70 - 99 mg/dL Final   BUN 89/84/7974 20  8 - 23 mg/dL Final   Creatinine, Ser 08/04/2024 1.10  0.61 - 1.24 mg/dL Final   Calcium  08/04/2024 8.6 (L)  8.9 - 10.3 mg/dL Final   Total Protein 89/84/7974 5.0 (L)  6.5 - 8.1 g/dL Final   Albumin 89/84/7974 2.6 (L)  3.5 - 5.0 g/dL Final   AST 89/84/7974 14 (L)  15 - 41 U/L Final   ALT  08/04/2024 21  0 - 44 U/L Final   Alkaline Phosphatase 08/04/2024 61  38 - 126 U/L Final   Total Bilirubin 08/04/2024 0.4  0.0 - 1.2 mg/dL Final   GFR, Estimated 08/04/2024 >60  >60 mL/min Final   Anion gap 08/04/2024 9  5 - 15 Final   WBC 08/04/2024 12.3 (H)  4.0 - 10.5 K/uL Final   RBC 08/04/2024 3.65 (L)  4.22 - 5.81 MIL/uL Final   Hemoglobin 08/04/2024 11.8 (L)  13.0 - 17.0 g/dL Final   HCT 89/84/7974 35.0 (L)  39.0 - 52.0 % Final   MCV 08/04/2024 95.9  80.0 - 100.0 fL Final   MCH 08/04/2024 32.3  26.0 - 34.0 pg Final   MCHC 08/04/2024 33.7  30.0 - 36.0 g/dL  Final   RDW 08/04/2024 13.3  11.5 - 15.5 % Final   Platelets 08/04/2024 257  150 - 400 K/uL Final   nRBC 08/04/2024 0.0  0.0 - 0.2 % Final   Magnesium  08/04/2024 1.8  1.7 - 2.4 mg/dL Final   Glucose-Capillary 08/03/2024 197 (H)  70 - 99 mg/dL Final   Glucose-Capillary 08/04/2024 159 (H)  70 - 99 mg/dL Final   Glucose-Capillary 08/04/2024 198 (H)  70 - 99 mg/dL Final   Glucose-Capillary 08/04/2024 195 (H)  70 - 99 mg/dL Final  Admission on 90/88/7974, Discharged on 07/01/2024  Component Date Value Ref Range Status   Sodium 07/01/2024 138  135 - 145 mmol/L Final   Potassium 07/01/2024 4.9  3.5 - 5.1 mmol/L Final   Chloride 07/01/2024 100  98 - 111 mmol/L Final   CO2 07/01/2024 25  22 - 32 mmol/L Final   Glucose, Bld 07/01/2024 197 (H)  70 - 99 mg/dL Final   BUN 90/88/7974 28 (H)  8 - 23 mg/dL Final   Creatinine, Ser 07/01/2024 1.53 (H)  0.61 - 1.24 mg/dL Final   Calcium  07/01/2024 9.8  8.9 - 10.3 mg/dL Final   GFR, Estimated 07/01/2024 47 (L)  >60 mL/min Final   Anion gap 07/01/2024 14  5 - 15 Final   WBC 07/01/2024 14.9 (H)  4.0 - 10.5 K/uL Final   RBC 07/01/2024 4.77  4.22 - 5.81 MIL/uL Final   Hemoglobin 07/01/2024 15.4  13.0 - 17.0 g/dL Final   HCT 90/88/7974 46.9  39.0 - 52.0 % Final   MCV 07/01/2024 98.3  80.0 - 100.0 fL Final   MCH 07/01/2024 32.3  26.0 - 34.0 pg Final   MCHC 07/01/2024 32.8  30.0 - 36.0 g/dL Final    RDW 90/88/7974 13.9  11.5 - 15.5 % Final   Platelets 07/01/2024 287  150 - 400 K/uL Final   nRBC 07/01/2024 0.0  0.0 - 0.2 % Final  Office Visit on 06/16/2024  Component Date Value Ref Range Status   Microalb, Ur 06/16/2024 1.9  0.0 - 1.9 mg/dL Final   Creatinine,U 91/72/7974 81.8  mg/dL Final   Microalb Creat Ratio 06/16/2024 23.8  0.0 - 30.0 mg/g Final   Sodium 06/16/2024 137  135 - 145 mEq/L Final   Potassium 06/16/2024 4.6  3.5 - 5.1 mEq/L Final   Chloride 06/16/2024 102  96 - 112 mEq/L Final   CO2 06/16/2024 26  19 - 32 mEq/L Final   Glucose, Bld 06/16/2024 180 (H)  70 - 99 mg/dL Final   BUN 91/72/7974 28 (H)  6 - 23 mg/dL Final   Creatinine, Ser 06/16/2024 1.10  0.40 - 1.50 mg/dL Final   GFR 91/72/7974 64.60  >60.00 mL/min Final   Calcium  06/16/2024 8.5  8.4 - 10.5 mg/dL Final   WBC 91/72/7974 10.2  4.0 - 10.5 K/uL Final   RBC 06/16/2024 4.22  4.22 - 5.81 Mil/uL Final   Hemoglobin 06/16/2024 13.3  13.0 - 17.0 g/dL Final   HCT 91/72/7974 40.3  39.0 - 52.0 % Final   MCV 06/16/2024 95.7  78.0 - 100.0 fl Final   MCHC 06/16/2024 32.9  30.0 - 36.0 g/dL Final   RDW 91/72/7974 14.0  11.5 - 15.5 % Final   Platelets 06/16/2024 273.0  150.0 - 400.0 K/uL Final   Neutrophils Relative % 06/16/2024 93.3 (H)  43.0 - 77.0 % Final   Lymphocytes Relative 06/16/2024 4.9 (L)  12.0 - 46.0 % Final   Monocytes Relative 06/16/2024  1.7 (L)  3.0 - 12.0 % Final   Eosinophils Relative 06/16/2024 0.0  0.0 - 5.0 % Final   Basophils Relative 06/16/2024 0.1  0.0 - 3.0 % Final   Neutro Abs 06/16/2024 9.6 (H)  1.4 - 7.7 K/uL Final   Lymphs Abs 06/16/2024 0.5 (L)  0.7 - 4.0 K/uL Final   Monocytes Absolute 06/16/2024 0.2  0.1 - 1.0 K/uL Final   Eosinophils Absolute 06/16/2024 0.0  0.0 - 0.7 K/uL Final   Basophils Absolute 06/16/2024 0.0  0.0 - 0.1 K/uL Final  Admission on 06/12/2024, Discharged on 06/14/2024  Component Date Value Ref Range Status   Sodium 06/12/2024 137  135 - 145 mmol/L Final   Potassium  06/12/2024 4.2  3.5 - 5.1 mmol/L Final   Chloride 06/12/2024 102  98 - 111 mmol/L Final   CO2 06/12/2024 23  22 - 32 mmol/L Final   Glucose, Bld 06/12/2024 154 (H)  70 - 99 mg/dL Final   BUN 91/76/7974 27 (H)  8 - 23 mg/dL Final   Creatinine, Ser 06/12/2024 1.36 (H)  0.61 - 1.24 mg/dL Final   Calcium  06/12/2024 8.9  8.9 - 10.3 mg/dL Final   GFR, Estimated 06/12/2024 54 (L)  >60 mL/min Final   Anion gap 06/12/2024 12  5 - 15 Final   WBC 06/12/2024 14.2 (H)  4.0 - 10.5 K/uL Final   RBC 06/12/2024 3.98 (L)  4.22 - 5.81 MIL/uL Final   Hemoglobin 06/12/2024 12.6 (L)  13.0 - 17.0 g/dL Final   HCT 91/76/7974 38.7 (L)  39.0 - 52.0 % Final   MCV 06/12/2024 97.2  80.0 - 100.0 fL Final   MCH 06/12/2024 31.7  26.0 - 34.0 pg Final   MCHC 06/12/2024 32.6  30.0 - 36.0 g/dL Final   RDW 91/76/7974 13.6  11.5 - 15.5 % Final   Platelets 06/12/2024 249  150 - 400 K/uL Final   nRBC 06/12/2024 0.0  0.0 - 0.2 % Final   Troponin I (High Sensitivity) 06/12/2024 10  <18 ng/L Final   Color, Urine 06/12/2024 YELLOW  YELLOW Final   APPearance 06/12/2024 HAZY (A)  CLEAR Final   Specific Gravity, Urine 06/12/2024 1.013  1.005 - 1.030 Final   pH 06/12/2024 5.0  5.0 - 8.0 Final   Glucose, UA 06/12/2024 NEGATIVE  NEGATIVE mg/dL Final   Hgb urine dipstick 06/12/2024 MODERATE (A)  NEGATIVE Final   Bilirubin Urine 06/12/2024 NEGATIVE  NEGATIVE Final   Ketones, ur 06/12/2024 NEGATIVE  NEGATIVE mg/dL Final   Protein, ur 91/76/7974 NEGATIVE  NEGATIVE mg/dL Final   Nitrite 91/76/7974 NEGATIVE  NEGATIVE Final   Leukocytes,Ua 06/12/2024 SMALL (A)  NEGATIVE Final   RBC / HPF 06/12/2024 0-5  0 - 5 RBC/hpf Final   WBC, UA 06/12/2024 21-50  0 - 5 WBC/hpf Final   Bacteria, UA 06/12/2024 RARE (A)  NONE SEEN Final   Squamous Epithelial / HPF 06/12/2024 0-5  0 - 5 /HPF Final   Mucus 06/12/2024 PRESENT   Final   Hyaline Casts, UA 06/12/2024 PRESENT   Final   Specimen Description 06/12/2024 BLOOD RIGHT ARM   Final   Special  Requests 06/12/2024 BOTTLES DRAWN AEROBIC AND ANAEROBIC Blood Culture adequate volume   Final   Culture 06/12/2024    Final                   Value:NO GROWTH 6 DAYS Performed at [facility], [address] [place], [place] [address]  Report Status 06/12/2024 06/18/2024 FINAL   Final   Specimen Description 06/12/2024 BLOOD LEFT ARM   Final   Special Requests 06/12/2024 BOTTLES DRAWN AEROBIC AND ANAEROBIC Blood Culture adequate volume   Final   Culture 06/12/2024    Final                   Value:NO GROWTH 6 DAYS Performed at [facility], [address] [place], [place] [address]    Report Status 06/12/2024 06/18/2024 FINAL   Final   SARS Coronavirus 2 by RT PCR 06/12/2024 NEGATIVE  NEGATIVE Final   Influenza A by PCR 06/12/2024 NEGATIVE  NEGATIVE Final   Influenza B by PCR 06/12/2024 NEGATIVE  NEGATIVE Final   Resp Syncytial Virus by PCR 06/12/2024 NEGATIVE  NEGATIVE Final   Lactic Acid, Venous 06/12/2024 3.8 (HH)  0.5 - 1.9 mmol/L Final   Comment 06/12/2024 NOTIFIED PHYSICIAN   Final   Troponin I (High Sensitivity) 06/12/2024 9  <18 ng/L Final   Total Protein 06/12/2024 4.8 (L)  6.5 - 8.1 g/dL Final   Albumin 91/76/7974 2.7 (L)  3.5 - 5.0 g/dL Final   AST 91/76/7974 19  15 - 41 U/L Final   ALT 06/12/2024 21  0 - 44 U/L Final   Alkaline Phosphatase 06/12/2024 63  38 - 126 U/L Final   Total Bilirubin 06/12/2024 0.6  0.0 - 1.2 mg/dL Final   Bilirubin, Direct 06/12/2024 <0.1  0.0 - 0.2 mg/dL Final   Indirect Bilirubin 06/12/2024 NOT CALCULATED  0.3 - 0.9 mg/dL Final   Lactic Acid, Venous 06/12/2024 3.2 (HH)  0.5 - 1.9 mmol/L Final   Comment 06/12/2024 NOTIFIED PHYSICIAN   Final   Procalcitonin 06/12/2024 0.71  ng/mL Final   Hgb A1c MFr Bld 06/12/2024 6.1 (H)  4.8 - 5.6 % Final   Mean Plasma Glucose 06/12/2024 128.37  mg/dL Final   CRP 91/76/7974 0.6  <1.0 mg/dL Final   B Natriuretic Peptide 06/12/2024 224.1 (H)  0.0 - 100.0 pg/mL Final   Magnesium  06/12/2024 2.1  1.7 -  2.4 mg/dL Final   TSH 91/76/7974 3.713  0.350 - 4.500 uIU/mL Final   Anti Nuclear Antibody (ANA) 06/12/2024 Negative  Negative Final   Specimen Description 06/12/2024 URINE, CLEAN CATCH   Final   Special Requests 06/12/2024    Final                   Value:NONE Performed at [facility], [address], [place], [place] [address]    Culture 06/12/2024 >=100,000 COLONIES/mL ESCHERICHIA COLI (A)   Final   Report Status 06/12/2024 06/14/2024 FINAL   Final   Organism ID, Bacteria 06/12/2024 ESCHERICHIA COLI (A)   Final   Glucose-Capillary 06/12/2024 323 (H)  70 - 99 mg/dL Final   Sodium 91/75/7974 138  135 - 145 mmol/L Final   Potassium 06/13/2024 4.3  3.5 - 5.1 mmol/L Final   Chloride 06/13/2024 106  98 - 111 mmol/L Final   CO2 06/13/2024 24  22 - 32 mmol/L Final   Glucose, Bld 06/13/2024 134 (H)  70 - 99 mg/dL Final   BUN 91/75/7974 19  8 - 23 mg/dL Final   Creatinine, Ser 06/13/2024 1.21  0.61 - 1.24 mg/dL Final   Calcium  06/13/2024 8.4 (L)  8.9 - 10.3 mg/dL Final   Total Protein 91/75/7974 4.8 (L)  6.5 - 8.1 g/dL Final   Albumin 91/75/7974 2.6 (L)  3.5 - 5.0 g/dL Final   AST  06/13/2024 14 (L)  15 - 41 U/L Final   ALT 06/13/2024 18  0 - 44 U/L Final   Alkaline Phosphatase 06/13/2024 55  38 - 126 U/L Final   Total Bilirubin 06/13/2024 0.5  0.0 - 1.2 mg/dL Final   GFR, Estimated 06/13/2024 >60  >60 mL/min Final   Anion gap 06/13/2024 8  5 - 15 Final   WBC 06/13/2024 21.8 (H)  4.0 - 10.5 K/uL Final   RBC 06/13/2024 3.57 (L)  4.22 - 5.81 MIL/uL Final   Hemoglobin 06/13/2024 11.4 (L)  13.0 - 17.0 g/dL Final   HCT 91/75/7974 35.0 (L)  39.0 - 52.0 % Final   MCV 06/13/2024 98.0  80.0 - 100.0 fL Final   MCH 06/13/2024 31.9  26.0 - 34.0 pg Final   MCHC 06/13/2024 32.6  30.0 - 36.0 g/dL Final   RDW 91/75/7974 13.9  11.5 - 15.5 % Final   Platelets 06/13/2024 205  150 - 400 K/uL Final   nRBC 06/13/2024 0.0  0.0 - 0.2 % Final   Cortisol, Plasma 06/13/2024 2.7  ug/dL Final   Lactic Acid,  Venous 06/12/2024 3.7 (HH)  0.5 - 1.9 mmol/L Final   Lactic Acid, Venous 06/12/2024 3.1 (HH)  0.5 - 1.9 mmol/L Final   Troponin I (High Sensitivity) 06/12/2024 7  <18 ng/L Final   Troponin I (High Sensitivity) 06/12/2024 6  <18 ng/L Final   Lactic Acid, Venous 06/12/2024 2.5 (HH)  0.5 - 1.9 mmol/L Final   Lactic Acid, Venous 06/13/2024 1.4  0.5 - 1.9 mmol/L Final   Glucose-Capillary 06/12/2024 178 (H)  70 - 99 mg/dL Final   Weight 91/75/7974 2,976  oz Final   Height 06/13/2024 68  in Final   BP 06/13/2024 132/90  mmHg Final   Single Plane A4C EF 06/13/2024 48.2  % Final   S' Lateral 06/13/2024 3.20  cm Final   AR max vel 06/13/2024 1.75  cm2 Final   AV Area mean vel 06/13/2024 1.83  cm2 Final   AV Area VTI 06/13/2024 1.76  cm2 Final   Est EF 06/13/2024 60 - 65%   Final   AV Peak grad 06/13/2024 7.2  mmHg Final   Ao pk vel 06/13/2024 1.34  m/s Final   AV Mean grad 06/13/2024 3.5  mmHg Final   Glucose-Capillary 06/13/2024 117 (H)  70 - 99 mg/dL Final   Glucose-Capillary 06/13/2024 161 (H)  70 - 99 mg/dL Final   Glucose-Capillary 06/13/2024 153 (H)  70 - 99 mg/dL Final   WBC 91/74/7974 14.6 (H)  4.0 - 10.5 K/uL Final   RBC 06/14/2024 3.63 (L)  4.22 - 5.81 MIL/uL Final   Hemoglobin 06/14/2024 11.6 (L)  13.0 - 17.0 g/dL Final   HCT 91/74/7974 35.1 (L)  39.0 - 52.0 % Final   MCV 06/14/2024 96.7  80.0 - 100.0 fL Final   MCH 06/14/2024 32.0  26.0 - 34.0 pg Final   MCHC 06/14/2024 33.0  30.0 - 36.0 g/dL Final   RDW 91/74/7974 13.6  11.5 - 15.5 % Final   Platelets 06/14/2024 212  150 - 400 K/uL Final   nRBC 06/14/2024 0.0  0.0 - 0.2 % Final   Glucose-Capillary 06/13/2024 148 (H)  70 - 99 mg/dL Final   Glucose-Capillary 06/14/2024 107 (H)  70 - 99 mg/dL Final  Admission on 91/84/7974, Discharged on 06/04/2024  Component Date Value Ref Range Status   Sodium 06/04/2024 141  135 - 145 mmol/L Final   Potassium 06/04/2024 4.5  3.5 - 5.1 mmol/L Final   Chloride 06/04/2024 104  98 - 111 mmol/L  Final   CO2 06/04/2024 24  22 - 32 mmol/L Final   Glucose, Bld 06/04/2024 106 (H)  70 - 99 mg/dL Final   BUN 91/84/7974 32 (H)  8 - 23 mg/dL Final   Creatinine, Ser 06/04/2024 1.58 (H)  0.61 - 1.24 mg/dL Final   Calcium  06/04/2024 9.4  8.9 - 10.3 mg/dL Final   GFR, Estimated 06/04/2024 45 (L)  >60 mL/min Final   Anion gap 06/04/2024 13  5 - 15 Final   WBC 06/04/2024 12.1 (H)  4.0 - 10.5 K/uL Final   RBC 06/04/2024 4.15 (L)  4.22 - 5.81 MIL/uL Final   Hemoglobin 06/04/2024 13.1  13.0 - 17.0 g/dL Final   HCT 91/84/7974 40.5  39.0 - 52.0 % Final   MCV 06/04/2024 97.6  80.0 - 100.0 fL Final   MCH 06/04/2024 31.6  26.0 - 34.0 pg Final   MCHC 06/04/2024 32.3  30.0 - 36.0 g/dL Final   RDW 91/84/7974 13.2  11.5 - 15.5 % Final   Platelets 06/04/2024 281  150 - 400 K/uL Final   nRBC 06/04/2024 0.0  0.0 - 0.2 % Final  Admission on 04/14/2024, Discharged on 04/15/2024  Component Date Value Ref Range Status   WBC 04/14/2024 12.6 (H)  4.0 - 10.5 K/uL Final   RBC 04/14/2024 4.30  4.22 - 5.81 MIL/uL Final   Hemoglobin 04/14/2024 13.6  13.0 - 17.0 g/dL Final   HCT 93/74/7974 42.2  39.0 - 52.0 % Final   MCV 04/14/2024 98.1  80.0 - 100.0 fL Final   MCH 04/14/2024 31.6  26.0 - 34.0 pg Final   MCHC 04/14/2024 32.2  30.0 - 36.0 g/dL Final   RDW 93/74/7974 13.7  11.5 - 15.5 % Final   Platelets 04/14/2024 265  150 - 400 K/uL Final   nRBC 04/14/2024 0.0  0.0 - 0.2 % Final   Color, Urine 04/15/2024 YELLOW  YELLOW Final   APPearance 04/15/2024 CLEAR  CLEAR Final   Specific Gravity, Urine 04/15/2024 1.018  1.005 - 1.030 Final   pH 04/15/2024 6.0  5.0 - 8.0 Final   Glucose, UA 04/15/2024 NEGATIVE  NEGATIVE mg/dL Final   Hgb urine dipstick 04/15/2024 NEGATIVE  NEGATIVE Final   Bilirubin Urine 04/15/2024 NEGATIVE  NEGATIVE Final   Ketones, ur 04/15/2024 NEGATIVE  NEGATIVE mg/dL Final   Protein, ur 93/73/7974 NEGATIVE  NEGATIVE mg/dL Final   Nitrite 93/73/7974 NEGATIVE  NEGATIVE Final   Leukocytes,Ua  04/15/2024 NEGATIVE  NEGATIVE Final   Sodium 04/14/2024 137  135 - 145 mmol/L Final   Potassium 04/14/2024 4.3  3.5 - 5.1 mmol/L Final   Chloride 04/14/2024 102  98 - 111 mmol/L Final   CO2 04/14/2024 25  22 - 32 mmol/L Final   Glucose, Bld 04/14/2024 101 (H)  70 - 99 mg/dL Final   BUN 93/74/7974 28 (H)  8 - 23 mg/dL Final   Creatinine, Ser 04/14/2024 1.60 (H)  0.61 - 1.24 mg/dL Final   Calcium  04/14/2024 8.6 (L)  8.9 - 10.3 mg/dL Final   Total Protein 93/74/7974 6.9  6.5 - 8.1 g/dL Final   Albumin 93/74/7974 3.9  3.5 - 5.0 g/dL Final   AST 93/74/7974 20  15 - 41 U/L Final   ALT 04/14/2024 15  0 - 44 U/L Final   Alkaline Phosphatase 04/14/2024 64  38 - 126 U/L Final   Total Bilirubin 04/14/2024 0.8  0.0 -  1.2 mg/dL Final   GFR, Estimated 04/14/2024 44 (L)  >60 mL/min Final   Anion gap 04/14/2024 10  5 - 15 Final   Magnesium  04/14/2024 1.9  1.7 - 2.4 mg/dL Final   Sodium 93/73/7974 138  135 - 145 mmol/L Final   Potassium 04/15/2024 4.2  3.5 - 5.1 mmol/L Final   Chloride 04/15/2024 105  98 - 111 mmol/L Final   CO2 04/15/2024 24  22 - 32 mmol/L Final   Glucose, Bld 04/15/2024 144 (H)  70 - 99 mg/dL Final   BUN 93/73/7974 25 (H)  8 - 23 mg/dL Final   Creatinine, Ser 04/15/2024 1.53 (H)  0.61 - 1.24 mg/dL Final   Calcium  04/15/2024 8.5 (L)  8.9 - 10.3 mg/dL Final   GFR, Estimated 04/15/2024 47 (L)  >60 mL/min Final   Anion gap 04/15/2024 9  5 - 15 Final  There may be more visits with results that are not included.   DG Pelvis Portable Result Date: 08/27/2024 CLINICAL DATA:  Postop EXAM: PORTABLE PELVIS 1-2 VIEWS COMPARISON:  08/18/2024 FINDINGS: Status post left hip replacement with intact hardware and normal alignment. Gas in the soft tissues consistent with recent surgery. Pubic symphysis and rami are intact. Advanced right hip degenerative change with bone on bone contact and subarticular sclerosis. IMPRESSION: Status post left hip replacement with expected postsurgical change.  Electronically Signed   By: Luke Bun M.D.   On: 08/27/2024 23:09   DG HIP UNILAT WITH PELVIS 2-3 VIEWS LEFT Result Date: 08/27/2024 CLINICAL DATA:  Hip surgery EXAM: DG HIP (WITH OR WITHOUT PELVIS) 2-3V*L* COMPARISON:  08/18/2024 FINDINGS: Multiple low resolution intraoperative spot views of the left hip. Total fluoroscopy time was 19.1 seconds, fluoroscopic dose of 2.45 mGy. Images were obtained during operative left hip replacement IMPRESSION: Intraoperative fluoroscopic assistance provided during left hip replacement. Electronically Signed   By: Luke Bun M.D.   On: 08/27/2024 23:09   DG C-Arm 1-60 Min-No Report Result Date: 08/27/2024 Fluoroscopy was utilized by the requesting physician.  No radiographic interpretation.   DG FL ASP/INJ MAJOR (SHOULDER, HIP, KNEE) Result Date: 08/19/2024 CLINICAL DATA:  CLINICAL DATA Request for aspiration of L hip joint effusion. EXAM: DIAGNOSTIC RIGHT HIP ASPIRATION UNDER FLUOROSCOPIC GUIDANCE FLUOROSCOPY TIME:  Fluoroscopy Time: 36 seconds Radiation Exposure Index (if provided by the fluoroscopic device): 5 mGy Number of Acquired Spot Images: 1 PROCEDURE: The risks and benefits of the procedure were discussed with the patient, and written informed consent was obtained. The patient stated no history of allergy to contrast media. A formal timeout procedure was performed with the patient according to departmental protocol. The patient was placed supine on the fluoroscopy table and the left intertrochanteric space was identified under fluoroscopy. The skin overlying the left intertrochanteric space was subsequently cleaned with Chloraprep and a sterile drape was placed over the area of interest. 5 ml 1% Lidocaine  was used to anesthetize the skin around the needle insertion site. A 20 gauge spinal needle was inserted into the left anterior interspace under fluoroscopy. 1 mL of thick, yellow tinged fluid aspirated and sent to lab. The needle was removed and  hemostasis was achieved. IMPRESSION: Technically successful right hip aspiration. Performed by Laymon Coast, NP under the supervision of Dr. Jennefer Electronically Signed   By: Ester Jennefer M.D.   On: 08/19/2024 15:09   MR HIP LEFT W WO CONTRAST Result Date: 08/19/2024 CLINICAL DATA:  Fracture, hip Worsening left hip pain for 1 week. Abnormality of the left  femoral head on CT indeterminate for fracture. EXAM: MRI OF THE LEFT HIP WITHOUT AND WITH CONTRAST TECHNIQUE: Multiplanar, multisequence MR imaging was performed both before and after administration of intravenous contrast. CONTRAST:  8mL GADAVIST  GADOBUTROL  1 MMOL/ML IV SOLN COMPARISON:  Left hip radiographs 08/18/2024, 08/15/2024 and 01/29/2024. CT of the left hip 08/18/2024. FINDINGS: Technical note: Despite efforts by the technologist and patient, mild motion artifact is present on today's exam and could not be eliminated. This reduces exam sensitivity and specificity. Bones/Joint/Cartilage As shown on recent radiographs, there is osteolysis of the left femoral head which is new compared with radiographs performed 6 months ago. There is flattening of the left femoral head with heterogeneous marrow signal throughout the left femoral neck and intertrochanteric region. No acute fracture identified. Probable degenerative reactive edema and enhancement within the left superior acetabulum. There is moderate size left hip joint effusion with associated nonspecific synovial thickening. There are moderate right hip degenerative changes with joint space narrowing, osteophytes and subchondral cyst formation. The bony pelvis and sacroiliac joints otherwise appear unremarkable. There is multilevel lower lumbar spondylosis. Ligaments Not relevant for exam/indication. Muscles and Tendons No acute musculotendinous findings are identified. There is fatty atrophy of the gluteus musculature bilaterally, greater on the left. There is chronic partial tearing of the  common hamstring tendons, greater on the right. The piriformis muscles are symmetric. Soft tissue No evidence of periarticular fluid collection or foreign body. Bilateral scrotal hydroceles are noted. IMPRESSION: 1. Osteolysis of the left femoral head with flattening of the left femoral head and heterogeneous marrow signal throughout the left femoral neck and intertrochanteric region. 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 No acute fracture identified. 2. Moderate-sized left hip joint effusion with nonspecific synovial thickening. If concern for infection, consider joint aspiration. 3. Moderate right hip degenerative changes. 4. Chronic partial tearing of the common hamstring tendons, greater on the right. 5. Bilateral scrotal hydroceles. Electronically Signed   By: Elsie Perone M.D.   On: 08/19/2024 08:50   CT Lumbar Spine Wo Contrast Result Date: 08/18/2024 CLINICAL DATA:  Low back pain EXAM: CT LUMBAR SPINE WITHOUT CONTRAST TECHNIQUE: Multidetector CT imaging of the lumbar spine was performed without intravenous contrast administration. Multiplanar CT image reconstructions were also generated. RADIATION DOSE REDUCTION: This exam was performed according to the departmental dose-optimization program which includes automated exposure control, adjustment of the mA and/or kV according to patient size and/or use of iterative reconstruction technique. COMPARISON:  Lumbar CT 08/15/2024, chest CT 08/02/2024 FINDINGS: Segmentation: Transitional anatomy, same numbering scheme as with comparison study from 08/15/2024. Transitional S1 vertebra. First non rib-bearing vertebra is designated L1. Alignment: Mild scoliosis. Grade 1 anterolisthesis L4 on L5. Trace retrolisthesis L2-L3 and L3-L4. Vertebrae: Vertebral body heights are maintained.  No fracture Paraspinal and other soft tissues: Aortic atherosclerosis. No acute finding. Small left kidney stone. Disc  levels: At L1-L2, no high-grade canal stenosis. Mild facet degenerative changes. At L2-L3, disc space narrowing and vacuum disc. No high-grade canal stenosis. Moderate facet degenerative changes. Moderate right greater than left subarticular and foraminal narrowing. At L3-L4, disc space narrowing. Diffuse disc bulge. Moderate hypertrophic facet degenerative changes. Moderate subarticular and foraminal narrowing bilaterally. At L4-L5, disc space narrowing and vacuum discs. Severe hypertrophic facet degenerative changes. Severe canal stenosis and moderate bilateral foraminal narrowing. At L5-S1, disc space narrowing and vacuum disc. Posterior decompression changes. Severe hypertrophic facet degenerative changes. Moderate bilateral foraminal narrowing. IMPRESSION: 1. Transitional anatomy, same numbering scheme as with  comparison study from 08/15/2024. 2. No acute osseous abnormality. 3. Multilevel degenerative changes, most advanced at L4-L5 where there is severe canal stenosis and bilateral foraminal narrowing. 4. Aortic atherosclerosis. Aortic Atherosclerosis (ICD10-I70.0). Electronically Signed   By: Luke Bun M.D.   On: 08/18/2024 17:29   CT Hip Left Wo Contrast Result Date: 08/18/2024 CLINICAL DATA:  Left hip pain. EXAM: CT OF THE LEFT HIP WITHOUT CONTRAST TECHNIQUE: Multidetector CT imaging of the left hip was performed according to the standard protocol. Multiplanar CT image reconstructions were also generated. RADIATION DOSE REDUCTION: This exam was performed according to the departmental dose-optimization program which includes automated exposure control, adjustment of the mA and/or kV according to patient size and/or use of iterative reconstruction technique. COMPARISON:  Left hip radiograph dated 08/18/2024. FINDINGS: Bones/Joint/Cartilage There is flattening of the left femoral head with cortical fragmentation. There is focal area of cortical angulation involving the basicervical femoral neck  (coronal 73/7). An impacted acute fracture is not excluded. There is chronic appearing fragmentation of the posterior left acetabular cortex. An area of periarticular amorphous calcification at the lateral aspect of the hip joint (70/7) noted. There is thickening and irregularity of the joint capsule and synovium. Additional somewhat rounded calcifications along the anteromedial proximal femoral neck noted. There is slight lateral subluxation of the femoral head in relation to the acetabular cup. Faint linear irregularity involving the medial acetabular roof (78/7) may be chronic. An acute fracture is not excluded. The bones are osteopenic. Ligaments Suboptimally assessed by CT. Muscles and Tendons No acute findings. Soft tissues Advanced atherosclerotic calcification of the aorta. IMPRESSION: 1. Chronic deformity and flattening of the left femoral head may be related to underlying avascular necrosis or chronic infection. An impacted basicervical fracture is not excluded. Orthopedic consult and further evaluation with MRI recommended. 2. 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 3.  Aortic Atherosclerosis (ICD10-I70.0). Electronically Signed   By: Vanetta Chou M.D.   On: 08/18/2024 17:24   DG Chest 1 View Result Date: 08/18/2024 CLINICAL DATA:  Fall. EXAM: CHEST  1 VIEW COMPARISON:  08/15/2024, CT 08/02/2024 FINDINGS: Chronic volume loss in the left hemithorax. Stable heart size and mediastinal contours. Aortic atherosclerosis underlying emphysema. No pneumothorax or significant pleural effusion. None limited assessment, no acute osseous findings. IMPRESSION: 1. No acute chest findings. 2. Chronic volume loss in the left hemithorax. Electronically Signed   By: Andrea Gasman M.D.   On: 08/18/2024 16:33   DG Hip Unilat W or Wo Pelvis 2-3 Views Left Result Date: 08/18/2024 CLINICAL DATA:  Left hip pain.  Pain for over 1 week. EXAM: DG HIP (WITH OR WITHOUT PELVIS)  2-3V LEFT COMPARISON:  Radiograph 3 days ago.  Radiograph 01/29/2024 FINDINGS: Deformity of the left proximal femur and femoral head. Flattening and collapse of the femoral head with lateral subluxation of the femoral shaft and secondary osteoarthritis. Difficult to exclude superimposed fracture as there may be slight increased subluxation. Advanced right hip osteoarthritis with complete joint space loss, subchondral cystic changes and sclerosis. The pubic rami are intact. Prominent vascular calcifications. IMPRESSION: 1. Deformity of the left proximal femur and femoral head with flattening and collapse of the femoral head and secondary osteoarthritis. Difficult to exclude superimposed fracture as there may be slight increased subluxation. Significant progression from April, findings may represent progressive avascular necrosis or rapidly progressive osteoarthritis. 2. Advanced right hip osteoarthritis. Electronically Signed   By: Andrea Gasman M.D.   On: 08/18/2024 16:31   DG Chest  2 View Result Date: 08/15/2024 CLINICAL DATA:  Bilateral lower extremity swelling and worsening left hip pain. EXAM: DG CHEST 2V COMPARISON:  08/05/2024. FINDINGS: Trachea is midline. Heart size normal. Lungs are clear. No pleural fluid. Bilateral shoulder osteoarthritis, left greater than right. IMPRESSION: No acute findings. Electronically Signed   By: Newell Eke M.D.   On: 08/15/2024 15:40   DG Hip Unilat W or Wo Pelvis 2-3 Views Left Result Date: 08/15/2024 CLINICAL DATA:  Worsening left hip pain. EXAM: DG HIP (WITH OR WITHOUT PELVIS) 2-3V LEFT COMPARISON:  01/29/2024. FINDINGS: Sclerosis and marked flattening of the left femoral head with lateral subluxation. Findings have progressed markedly from 01/29/2024. Superior joint space narrowing in the right hip with subchondral sclerosis, as on 01/29/2024. Sacroiliac joints are patent. IMPRESSION: 1. 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 2. Right hip osteoarthritis. Electronically Signed   By: Newell Eke M.D.   On: 08/15/2024 15:39   CT Lumbar Spine Wo Contrast Result Date: 08/15/2024 EXAM: CT OF THE LUMBAR SPINE WITHOUT CONTRAST 08/15/2024 02:45:17 PM TECHNIQUE: CT of the lumbar spine was performed without the administration of intravenous contrast. Multiplanar reformatted images are provided for review. Automated exposure control, iterative reconstruction, and/or weight based adjustment of the mA/kV was utilized to reduce the radiation dose to as low as reasonably achievable. COMPARISON: None available. CLINICAL HISTORY: FINDINGS: BONES AND ALIGNMENT: Transitional anatomy is present. Partial sacralization of the L5 vertebral body is noted. This was previously labeled L5. The change in nomenclature is based on comparison with chest imaging. The grade 1 anterolisthesis is therefore at L4-L5. Slight retrolisthesis is present at L1-L2, L2-L3, and L3-L4. No acute fracture or suspicious bone lesion. Not rapid curvature centered at L2. Compensatory leftward curvature is present at L5. Endplate sclerotic changes are worse right than left at L4-L5. DEGENERATIVE CHANGES: * **L1-L2:** Disc bulging and facet hypertrophy are stable without significant stenosis. * **L2-L3:** A broad-based disc protrusion and moderate facet hypertrophy are present. Moderate foraminal narrowing is worse on the left. * **L3-L4:** A broad-based disc protrusion and moderate facet hypertrophy are present. Moderate subarticular and foraminal narrowing is worse on the right. * **L4-L5:** A broad-based disc protrusion and advanced facet hypertrophy result in severe central and bilateral foraminal stenosis. * **L5-S1:** A broad-based disc protrusion is present. Laminectomy decompression of the posterior canal. Moderate residual foraminal stenosis is present bilaterally. * **S1:** Facet  hypertrophy is present. Laminectomy decompression of the spinal canal. The foramina are patent bilaterally. SOFT TISSUES: Atherosclerotic calcifications are present in the aorta and branch vessels without aneurysm. A 13 mm cystic lesion is present near the lower pole of the right kidney, incompletely characterized. No acute abnormality. IMPRESSION: 1. Grade 1 anterolisthesis at L4-5 with broad-based disc protrusion and advanced facet hypertrophy resulting in severe central and bilateral foraminal stenosis. 2. Broad-based disc protrusion at L5-S1 with prior laminectomy decompression and moderate residual foraminal stenosis bilaterally. 3. 13 mm cystic lesion near the lower pole of the right kidney that is incompletely characterized. Renal ultrasound may be useful for further evaluation as clinically indicated. 4. Transitional anatomy The last fully formed vertebral body is S1. This is a change from the previous MRI. Electronically signed by: Lonni Necessary MD 08/15/2024 03:01 PM EDT RP Workstation: [id number]   DG Chest Portable 1 View Result Date: 08/05/2024 CLINICAL DATA:  Shortness of breath. EXAM: PORTABLE CHEST 1 VIEW COMPARISON:  08/02/2024 FINDINGS: Lordotic positioning. The  lungs are clear without focal pneumonia, edema, pneumothorax or pleural effusion. Interstitial markings are diffusely coarsened with chronic features. The cardiopericardial silhouette is within normal limits for size. No acute bony abnormality. Telemetry leads overlie the chest. IMPRESSION: Chronic interstitial coarsening without acute cardiopulmonary findings. Electronically Signed   By: Camellia Candle M.D.   On: 08/05/2024 07:00   CT Angio Chest PE W and/or Wo Contrast Result Date: 08/02/2024 EXAM: CTA of the Chest with contrast for PE 08/02/2024 06:13:05 AM TECHNIQUE: CTA of the chest was performed after the administration of 75 mL of iohexol  (OMNIPAQUE ) 350 MG/ML injection. Multiplanar reformatted images are provided for  review. MIP images are provided for review. Automated exposure control, iterative reconstruction, and/or weight based adjustment of the mA/kV was utilized to reduce the radiation dose to as low as reasonably achievable. COMPARISON: Portable chest x-ray 08/02/2024. CTA chest 06/12/2024. CLINICAL HISTORY: 78 year old male with suspected PE, high probability, ongoing SOB, and history of Stage IV COPD. FINDINGS: PULMONARY ARTERIES: Pulmonary arteries are adequately opacified for evaluation. Good pulmonary artery contrast timing respiratory motion, primarily in the lower lungs. No pulmonary artery filling defect. Main pulmonary artery is normal in caliber. MEDIASTINUM: The heart demonstrates no acute abnormality. Normal heart size. No pericardial effusion. Calcified coronary artery and aortic atherosclerosis. LYMPH NODES: No mediastinal, hilar or axillary lymphadenopathy. LUNGS AND PLEURA: Asymmetric centrilobular emphysema. Evidence of previous left upper lobectomy. Patchy new distal peribronchial ground glass opacity in the right upper lobe (series 6, images 44 and 53) appears infectious/inflammatory. Major airways remain patent. Calcified granuloma in the left lung is benign (no follow up imaging recommended). No pleural effusion or pneumothorax. UPPER ABDOMEN: Small hiatal hernia has not significantly changed. Motion artifact in the upper abdomen. No upper abdominal pneumoperitoneum or free fluid. SOFT TISSUES AND BONES: Chronic left posterior rib fractures, chronic unhealed posterior left 8th rib fracture. No acute or suspicious osseous lesion. No acute soft tissue abnormality. IMPRESSION: 1. No pulmonary embolism. 2. Emphysema. Prior left upper lobectomy. Patchy new distal peribronchial ground-glass opacity in the right upper lobe, favor acute infectious exacerbation - Bronchopneumonia. Electronically signed by: Helayne Hurst MD 08/02/2024 06:38 AM EDT RP Workstation: [id number]   CT Head Wo Contrast Result Date:  08/02/2024 EXAM: CT HEAD WITHOUT CONTRAST 08/02/2024 06:13:05 AM TECHNIQUE: CT of the head was performed without the administration of intravenous contrast. Automated exposure control, iterative reconstruction, and/or weight based adjustment of the mA/kV was utilized to reduce the radiation dose to as low as reasonably achievable. COMPARISON: Report of brain MRI 10/12/2008 (no images available). CLINICAL HISTORY: 78 year old male. Mental status change, unknown cause. AMS. FINDINGS: BRAIN AND VENTRICLES: No acute intracranial hemorrhage identified. No evidence of acute infarct. Asymmetric extra-axial fluid over both cerebral hemispheres appears to be normal subarachnoid space CSF. No convincing subdural collection. No intracranial mass effect or midline shift. Brain volume is within normal limits for age. Gray-white differentiation is within normal limits for age. Calcified atherosclerosis at the skull base. No suspicious intracranial vascular hyperdensity. No hydrocephalus. ORBITS: Postoperative changes to both globes. SINUSES: No acute abnormality. SOFT TISSUES AND SKULL: Bulky craniocervical junction and C1-C2 bony degeneration. No acute soft tissue abnormality. No skull fracture. IMPRESSION: 1. No acute intracranial abnormality. Negative for age non-contrast CT appearance of the brain. Electronically signed by: Helayne Hurst MD 08/02/2024 06:32 AM EDT RP Workstation: [id number]   DG Chest Portable 1 View Result Date: 08/02/2024 EXAM: 1 VIEW(S) XRAY OF THE CHEST 08/02/2024 04:54:00 AM COMPARISON: Chest radiographs 07/01/2024 and earlier. CLINICAL HISTORY:  78 year old male with shortness of breath. FINDINGS: LINES, TUBES AND DEVICES: External tubing suspected, projecting over the medial left clavicle. LUNGS AND PLEURA: Lung volumes remain normal. No focal pulmonary opacity. No pulmonary edema. No pleural effusion. No pneumothorax. HEART AND MEDIASTINUM: No acute abnormality of the cardiac and mediastinal  silhouettes. BONES AND SOFT TISSUES: Advanced glenohumeral degeneration. No acute osseous abnormality. IMPRESSION: 1. No acute cardiopulmonary abnormality. Electronically signed by: Helayne Hurst MD 08/02/2024 05:03 AM EDT RP Workstation: [id number]         ASSESSMENT & PLAN   Assessment & Plan Chronic hip pain, bilateral Acute postoperative pain of left hip Acute right hip pain after right hip replacement   Severe right hip pain persists 2.5 weeks post-replacement surgery, described as numbness and worsened by weight-bearing activities. Tramadol  is ineffective. The differential diagnosis includes post-surgical pain and potential blood clot, but pain localized to the hip and mid-thigh suggests no clot. High heart rate may be due to pain or breathing issues. Butrans patches were considered for pain management, but hydrocodone  was chosen due to cost. Gabapentin  is considered for nerve pain. Prescribed hydrocodone  10 mg, up to every 4 hours as needed. Ordered blood work including blood counts, metabolic panel, magnesium , and phosphorus. Restarted aspirin  for blood clot prevention. Prescribed gabapentin  for nerve pain. Encouraged continuation of physical therapy. Advised follow-up with orthopedic specialist and primary care physician. Tachycardia Elevated heart rate may be due to pain or COPD exacerbation. No new leg pain (no palpable leg swelling today) or worsening of breathing symptoms. Nebulizer treatments effectively manage breathing issues. Rechecked heart rate during the visit. Chronic obstructive pulmonary disease, unspecified COPD type (HCC) Chronic obstructive pulmonary disease (COPD)   Chronic COPD is managed with nebulizer treatments and a rescue inhaler. Shortness of breath is controlled with the nebulizer. Discussed upgrading to [employer] inhaler, but cost may be prohibitive. Trelegy is already in use and effective. Prescribed DuoNeb solution for the nebulizer. Attempted to prescribe Air  Supra inhaler, noting potential cost issues. Refilled Trelegy prescription. Mild CAD Atherosclerotic heart disease of native coronary artery without angina pectoris   No recent cardiac issues reported. Aspirin  is recommended for blood clot prevention post-surgery. Restarted aspirin  for blood clot prevention.   ORDER ASSOCIATIONS  #   DIAGNOSIS / CONDITION ICD-10 ENCOUNTER ORDER     ICD-10-CM   1. Chronic hip pain, bilateral  M25.551 CBC with Differential/Platelet   M25.552 Comprehensive metabolic panel with GFR   G89.29 Magnesium     Phosphorus    gabapentin  (NEURONTIN ) 300 MG capsule    HYDROcodone -acetaminophen  (NORCO) 10-325 MG tablet    DISCONTINUED: HYDROcodone -acetaminophen  (NORCO) 10-325 MG tablet    2. Acute postoperative pain of left hip  G89.18 Phosphorus   M25.552 ipratropium-albuterol  (DUONEB) 0.5-2.5 (3) MG/3ML SOLN    3. Tachycardia  R00.0     4. Chronic obstructive pulmonary disease, unspecified COPD type (HCC)  J44.9 Albuterol -Budesonide  (AIRSUPRA ) 90-80 MCG/ACT AERO    ipratropium-albuterol  (DUONEB) 0.5-2.5 (3) MG/3ML SOLN    Fluticasone -Umeclidin-Vilant (TRELEGY ELLIPTA ) 200-62.5-25 MCG/ACT AEPB    5. Mild CAD  I25.10 aspirin  EC 81 MG tablet         Orders Placed in Encounter:   Lab Orders         CBC with Differential/Platelet         Comprehensive metabolic panel with GFR         Magnesium          Phosphorus     Meds ordered this encounter  Medications   DISCONTD: HYDROcodone -acetaminophen  (NORCO) 10-325 MG tablet    Sig: Take 1 tablet by mouth every 4 (four) hours as needed for up to 5 days.    Dispense:  30 tablet    Refill:  0   Albuterol -Budesonide  (AIRSUPRA ) 90-80 MCG/ACT AERO    Sig: Inhale 2 puffs into the lungs 4 (four) times daily as needed.    Dispense:  32.1 g    Refill:  1   ipratropium-albuterol  (DUONEB) 0.5-2.5 (3) MG/3ML SOLN    Sig: Take 3 mLs by nebulization every 6 (six) hours as needed.    Dispense:  360 mL    Refill:  5    Fluticasone -Umeclidin-Vilant (TRELEGY ELLIPTA ) 200-62.5-25 MCG/ACT AEPB    Sig: Inhale 1 puff into the lungs every evening.    Dispense:  1 each    Refill:  1   aspirin  EC 81 MG tablet    Sig: Take 1 tablet (81 mg total) by mouth 2 (two) times daily.    Dispense:  84 tablet    Refill:  0   gabapentin  (NEURONTIN ) 300 MG capsule    Sig: Take 1 capsule (300 mg total) by mouth 3 (three) times daily. Take for numbing nerve pain. sedating    Dispense:  30 capsule    Refill:  1   HYDROcodone -acetaminophen  (NORCO) 10-325 MG tablet    Sig: Take 1 tablet by mouth every 4 (four) hours as needed for up to 5 days.    Dispense:  30 tablet    Refill:  0    Patient reports not receiving initial attempt to prescription for this 09/09/24.  This is a reorder of previously sent medication(s) -please contact patient when ready or cancel if prior order already filled.   09/17/24 addendum: patient reports not receiving medication(s) will resend.      This document was synthesized by artificial intelligence ([employer]) using HIPAA-compliant recording of the clinical interaction;   We discussed the use of AI scribe software for clinical note transcription with the patient, who gave verbal consent to proceed. additional Info: This encounter employed state-of-the-art, real-time, collaborative documentation. The patient actively reviewed and assisted in updating their electronic medical record on a shared screen, ensuring transparency and facilitating joint problem-solving for the problem list, overview, and plan. This approach promotes accurate, informed care. The treatment plan was discussed and reviewed in detail, including medication safety, potential side effects, and all patient questions. We confirmed understanding and comfort with the plan. Follow-up instructions were established, including contacting the office for any concerns, returning if symptoms worsen, persist, or new symptoms develop, and precautions for  potential emergency department visits.

## 2024-09-10 NOTE — Telephone Encounter (Signed)
 Tried to call pt about lab results no answer left message to call office back or review by mychart.

## 2024-09-10 NOTE — Transitions of Care (Post Inpatient/ED Visit) (Unsigned)
   09/10/2024  Name: Gary Wright MRN: [id number] DOB: January 08, 1946  Today's TOC FU Call Status: Today's TOC FU Call Status:: Unsuccessful Call (2nd Attempt) Unsuccessful Call (1st Attempt) Date: 09/08/24 Unsuccessful Call (2nd Attempt) Date: 09/10/24  Attempted to reach the patient regarding the most recent Inpatient/ED visit.  Follow Up Plan: Additional outreach attempts will be made to reach the patient to complete the Transitions of Care (Post Inpatient/ED visit) call.  Julian Lemmings, LPN [facility] Nurse Health Advisor Direct Dial [phone]  Signature Julian Lemmings, LPN [facility] Nurse Health Advisor Direct Dial [phone]

## 2024-09-11 NOTE — Patient Instructions (Signed)
 It was a pleasure seeing you today! Your health and satisfaction are our top priorities.  Bernardino Cone, MD  VISIT SUMMARY: During your visit, we addressed your severe hip pain following your recent hip replacement surgery, your chronic COPD, and your elevated heart rate. We also discussed your heart health and the need for blood clot prevention.  YOUR PLAN: -ACUTE RIGHT HIP PAIN AFTER RIGHT HIP REPLACEMENT: You are experiencing severe pain in your right hip, which is common after hip replacement surgery. The pain is likely due to the surgery itself and not a blood clot. We have prescribed hydrocodone  10 mg, which you can take every 4 hours as needed for pain. We also prescribed gabapentin  to help with nerve pain. Continue with your physical therapy and follow up with your orthopedic specialist and primary care physician. We have also restarted your aspirin  to prevent blood clots and ordered blood work to monitor your health.  -CHRONIC OBSTRUCTIVE PULMONARY DISEASE (COPD): COPD is a chronic lung condition that makes it hard to breathe. Your condition is managed with nebulizer treatments and a rescue inhaler. We discussed the possibility of upgrading to an Air Supra inhaler, but it may be too expensive. You are already using Trelegy, which is effective. We have prescribed DuoNeb solution for your nebulizer and refilled your Trelegy prescription.  -TACHYCARDIA: Tachycardia means you have an elevated heart rate. This may be due to your pain or COPD. We rechecked your heart rate during the visit and will continue to monitor it.  -ATHEROSCLEROTIC HEART DISEASE OF NATIVE CORONARY ARTERY WITHOUT ANGINA PECTORIS: This condition involves the narrowing of your heart's arteries without causing chest pain. To prevent blood clots after your surgery, we have restarted your aspirin .  INSTRUCTIONS: Please follow up with your orthopedic specialist and primary care physician as advised. Continue with your physical  therapy and take your medications as prescribed. We have ordered blood work to monitor your health, so please complete these tests as soon as possible.  Your Providers PCP: Lucius Krabbe, NP,  [phone]) Referring Provider: Lucius Krabbe, NP,  [phone]) Care Team Provider: Pietro Redell RAMAN, MD,  [phone])  NEXT STEPS: [x]  Early Intervention: Schedule sooner appointment, call our on-call services, or go to emergency room if there is any significant Increase in pain or discomfort New or worsening symptoms Sudden or severe changes in your health [x]  Flexible Follow-Up: We recommend a No follow-ups on file. for optimal routine care. This allows for progress monitoring and treatment adjustments. [x]  Preventive Care: Schedule your annual preventive care visit! It's typically covered by insurance and helps identify potential health issues early. [x]  Lab & X-ray Appointments: Incomplete tests scheduled today, or call to schedule. X-rays: [facility] (M-F, 8:30am-noon or 1pm-5pm). [x]  Medical Information Release: Sign a release form at front desk to obtain relevant medical information we don't have.  MAKING THE MOST OF OUR FOCUSED 20 MINUTE APPOINTMENTS: [x]   Clearly state your top concerns at the beginning of the visit to focus our discussion [x]   If you anticipate you will need more time, please inform the front desk during scheduling - we can book multiple appointments in the same week. [x]   If you have transportation problems- use our convenient video appointments or ask about transportation support. [x]   We can get down to business faster if you use MyChart to update information before the visit and submit non-urgent questions before your visit. Thank you for taking the time to provide details through MyChart.  Let our  nurse know and she can import this information into your encounter documents.  Arrival and Wait Times: [x]   Arriving on time ensures that  everyone receives prompt attention. [x]   Early morning (8a) and afternoon (1p) appointments tend to have shortest wait times. [x]   Unfortunately, we cannot delay appointments for late arrivals or hold slots during phone calls.  Getting Answers and Following Up [x]   Simple Questions & Concerns: For quick questions or basic follow-up after your visit, reach us  at [phone] or MyChart messaging. [x]   Complex Concerns: If your concern is more complex, scheduling an appointment might be best. Discuss this with the staff to find the most suitable option. [x]   Lab & Imaging Results: We'll contact you directly if results are abnormal or you don't use MyChart. Most normal results will be on MyChart within 2-3 business days, with a review message from Dr. Jesus. Haven't heard back in 2 weeks? Need results sooner? Contact us  at [phone]. [x]   Referrals: Our referral coordinator will manage specialist referrals. The specialist's office should contact you within 2 weeks to schedule an appointment. Call us  if you haven't heard from them after 2 weeks.  Staying Connected [x]   MyChart: Activate your MyChart for the fastest way to access results and message us . See the last page of this paperwork for instructions on how to activate.  Bring to Your Next Appointment [x]   Medications: Please bring all your medication bottles to your next appointment to ensure we have an accurate record of your prescriptions. [x]   Health Diaries: If you're monitoring any health conditions at home, keeping a diary of your readings can be very helpful for discussions at your next appointment.  Billing [x]   X-ray & Lab Orders: These are billed by separate companies. Contact the invoicing company directly for questions or concerns. [x]   Visit Charges: Discuss any billing inquiries with our administrative services team.  Your Satisfaction Matters [x]   Share Your Experience: We strive for your satisfaction! If you have any  complaints, or preferably compliments, please let Dr. Jesus know directly or contact our Practice Administrators, Manuelita Rubin or [employer], by asking at the front desk.   Reviewing Your Records [x]   Review this early draft of your clinical encounter notes below and the final encounter summary tomorrow on MyChart after its been completed.  All orders placed so far are visible here: Chronic hip pain, bilateral -     CBC with Differential/Platelet -     Comprehensive metabolic panel with GFR -     Magnesium  -     Phosphorus -     HYDROcodone -Acetaminophen ; Take 1 tablet by mouth every 4 (four) hours as needed for up to 5 days.  Dispense: 30 tablet; Refill: 0 -     Gabapentin ; Take 1 capsule (300 mg total) by mouth 3 (three) times daily. Take for numbing nerve pain. sedating  Dispense: 30 capsule; Refill: 1  Tachycardia  Acute postoperative pain of left hip -     Phosphorus -     Ipratropium-Albuterol ; Take 3 mLs by nebulization every 6 (six) hours as needed.  Dispense: 360 mL; Refill: 5  Chronic obstructive pulmonary disease, unspecified COPD type (HCC) -     Airsupra ; Inhale 2 puffs into the lungs 4 (four) times daily as needed.  Dispense: 32.1 g; Refill: 1 -     Ipratropium-Albuterol ; Take 3 mLs by nebulization every 6 (six) hours as needed.  Dispense: 360 mL; Refill: 5 -  Trelegy Ellipta ; Inhale 1 puff into the lungs every evening.  Dispense: 1 each; Refill: 1  Mild CAD -     Aspirin ; Take 1 tablet (81 mg total) by mouth 2 (two) times daily.  Dispense: 84 tablet; Refill: 0

## 2024-09-11 NOTE — Assessment & Plan Note (Signed)
 Atherosclerotic heart disease of native coronary artery without angina pectoris   No recent cardiac issues reported. Aspirin  is recommended for blood clot prevention post-surgery. Restarted aspirin  for blood clot prevention.

## 2024-09-11 NOTE — Assessment & Plan Note (Signed)
 Acute right hip pain after right hip replacement   Severe right hip pain persists 2.5 weeks post-replacement surgery, described as numbness and worsened by weight-bearing activities. Tramadol  is ineffective. The differential diagnosis includes post-surgical pain and potential blood clot, but pain localized to the hip and mid-thigh suggests no clot. High heart rate may be due to pain or breathing issues. Butrans patches were considered for pain management, but hydrocodone  was chosen due to cost. Gabapentin  is considered for nerve pain. Prescribed hydrocodone  10 mg, up to every 4 hours as needed. Ordered blood work including blood counts, metabolic panel, magnesium , and phosphorus. Restarted aspirin  for blood clot prevention. Prescribed gabapentin  for nerve pain. Encouraged continuation of physical therapy. Advised follow-up with orthopedic specialist and primary care physician.

## 2024-09-13 NOTE — Transitions of Care (Post Inpatient/ED Visit) (Signed)
   09/13/2024  Name: Gary Wright MRN: [id number] DOB: [birth year]  Today's TOC FU Call Status: Today's TOC FU Call Status:: Unsuccessful Call (2nd Attempt) Unsuccessful Call (1st Attempt) Date: 09/08/24 Unsuccessful Call (2nd Attempt) Date: 09/10/24  Attempted to reach the patient regarding the most recent Inpatient/ED visit.  Follow Up Plan: No further outreach attempts will be made at this time. We have been unable to contact the patient. Patient already seen in office Signature Julian Lemmings, LPN [facility] Nurse Health Advisor Direct Dial [phone]

## 2024-09-14 ENCOUNTER — Other Ambulatory Visit (HOSPITAL_COMMUNITY): Payer: Self-pay

## 2024-09-14 ENCOUNTER — Ambulatory Visit (HOSPITAL_COMMUNITY)

## 2024-09-15 ENCOUNTER — Other Ambulatory Visit: Payer: Self-pay | Admitting: Internal Medicine

## 2024-09-15 ENCOUNTER — Telehealth: Payer: Self-pay

## 2024-09-15 NOTE — Telephone Encounter (Signed)
 Copied from CRM #[id number]. Topic: Clinical - Medication Question >> Sep 15, 2024 12:41 PM Shereese L wrote: Reason for CRM: Patient called in to check the status of medication   Please advise Oxycodone  Rx, Pt states this was to be sent in at office visit.

## 2024-09-15 NOTE — Telephone Encounter (Signed)
 Copied from CRM #[id number]. Topic: Clinical - Medication Refill >> Sep 15, 2024 11:38 AM Thersia C wrote: Medication: oxyCODONE -acetaminophen  (PERCOCET) 5-325 MG tablet - Patient called in stated that Dr.Morrison was suppose to refill this   Has the patient contacted their pharmacy? Yes (Agent: If no, request that the patient contact the pharmacy for the refill. If patient does not wish to contact the pharmacy document the reason why and proceed with request.) (Agent: If yes, when and what did the pharmacy advise?)  This is the patient's preferred pharmacy:  [id number] [place],  - [address] [facility] [address] [facility] [address] [facility] [address] [place] [address] Phone: [phone] Fax: [phone]   Is this the correct pharmacy for this prescription? Yes If no, delete pharmacy and type the correct one.   Has the prescription been filled recently? No  Is the patient out of the medication? Yes  Has the patient been seen for an appointment in the last year OR does the patient have an upcoming appointment? Yes  Can we respond through MyChart? Yes  Agent: Please be advised that Rx refills may take up to 3 business days. We ask that you follow-up with your pharmacy.

## 2024-09-17 ENCOUNTER — Other Ambulatory Visit (HOSPITAL_COMMUNITY): Payer: Self-pay

## 2024-09-17 MED ORDER — HYDROCODONE-ACETAMINOPHEN 10-325 MG PO TABS: 1.0000 | ORAL_TABLET | ORAL | 0 refills | Status: AC | PRN

## 2024-09-17 NOTE — Addendum Note (Signed)
 Addended by: Brandt Chaney G on: 09/17/2024 11:04 AM   Modules accepted: Orders

## 2024-09-18 ENCOUNTER — Emergency Department (HOSPITAL_COMMUNITY)

## 2024-09-18 ENCOUNTER — Encounter (HOSPITAL_COMMUNITY): Payer: Self-pay

## 2024-09-18 ENCOUNTER — Other Ambulatory Visit: Payer: Self-pay

## 2024-09-18 ENCOUNTER — Emergency Department (HOSPITAL_COMMUNITY)
Admission: EM | Admit: 2024-09-18 | Discharge: 2024-09-18 | Disposition: A | Discharge status: Home / Self Care | Attending: Emergency Medicine | Admitting: Emergency Medicine

## 2024-09-18 DIAGNOSIS — Z79899 Other long term (current) drug therapy: Secondary | ICD-10-CM | POA: Diagnosis not present

## 2024-09-18 DIAGNOSIS — Z85118 Personal history of other malignant neoplasm of bronchus and lung: Secondary | ICD-10-CM | POA: Insufficient documentation

## 2024-09-18 DIAGNOSIS — D72829 Elevated white blood cell count, unspecified: Secondary | ICD-10-CM | POA: Insufficient documentation

## 2024-09-18 DIAGNOSIS — R0602 Shortness of breath: Secondary | ICD-10-CM | POA: Diagnosis present

## 2024-09-18 DIAGNOSIS — J45909 Unspecified asthma, uncomplicated: Secondary | ICD-10-CM | POA: Diagnosis not present

## 2024-09-18 DIAGNOSIS — I10 Essential (primary) hypertension: Secondary | ICD-10-CM | POA: Insufficient documentation

## 2024-09-18 DIAGNOSIS — I2694 Multiple subsegmental pulmonary emboli without acute cor pulmonale: Secondary | ICD-10-CM | POA: Insufficient documentation

## 2024-09-18 DIAGNOSIS — I2510 Atherosclerotic heart disease of native coronary artery without angina pectoris: Secondary | ICD-10-CM | POA: Diagnosis not present

## 2024-09-18 DIAGNOSIS — J441 Chronic obstructive pulmonary disease with (acute) exacerbation: Secondary | ICD-10-CM | POA: Insufficient documentation

## 2024-09-18 DIAGNOSIS — Z7982 Long term (current) use of aspirin: Secondary | ICD-10-CM | POA: Diagnosis not present

## 2024-09-18 LAB — COMPREHENSIVE METABOLIC PANEL WITH GFR
ALT: 14 U/L (ref 0–44)
AST: 15 U/L (ref 15–41)
Albumin: 3.4 g/dL — ABNORMAL LOW (ref 3.5–5.0)
Alkaline Phosphatase: 82 U/L (ref 38–126)
Anion gap: 12 (ref 5–15)
BUN: 14 mg/dL (ref 8–23)
CO2: 24 mmol/L (ref 22–32)
Calcium: 8.6 mg/dL — ABNORMAL LOW (ref 8.9–10.3)
Chloride: 103 mmol/L (ref 98–111)
Creatinine, Ser: 1.15 mg/dL (ref 0.61–1.24)
GFR, Estimated: >60 mL/min (ref >60)
Glucose, Bld: 151 mg/dL — ABNORMAL HIGH (ref 70–99)
Potassium: 3.8 mmol/L (ref 3.5–5.1)
Sodium: 139 mmol/L (ref 135–145)
Total Bilirubin: 0.3 mg/dL (ref 0.0–1.2)
Total Protein: 5.6 g/dL — ABNORMAL LOW (ref 6.5–8.1)

## 2024-09-18 LAB — RESP PANEL BY RT-PCR (RSV, FLU A&B, COVID)  RVPGX2
Influenza A by PCR: NEGATIVE
Influenza B by PCR: NEGATIVE
Resp Syncytial Virus by PCR: NEGATIVE
SARS Coronavirus 2 by RT PCR: NEGATIVE

## 2024-09-18 LAB — TROPONIN I (HIGH SENSITIVITY)
Troponin I (High Sensitivity): 8 ng/L (ref <18)
Troponin I (High Sensitivity): 7 ng/L (ref <18)

## 2024-09-18 LAB — CBC WITH DIFFERENTIAL/PLATELET
Abs Immature Granulocytes: 0.15 K/uL — ABNORMAL HIGH (ref 0.00–0.07)
Basophils Absolute: 0.1 K/uL (ref 0.0–0.1)
Basophils Relative: 0 %
Eosinophils Absolute: 0.4 K/uL (ref 0.0–0.5)
Eosinophils Relative: 3 %
HCT: 35.4 % — ABNORMAL LOW (ref 39.0–52.0)
Hemoglobin: 11.3 g/dL — ABNORMAL LOW (ref 13.0–17.0)
Immature Granulocytes: 1 %
Lymphocytes Relative: 23 %
Lymphs Abs: 3.4 K/uL (ref 0.7–4.0)
MCH: 32 pg (ref 26.0–34.0)
MCHC: 31.9 g/dL (ref 30.0–36.0)
MCV: 100.3 fL — ABNORMAL HIGH (ref 80.0–100.0)
Monocytes Absolute: 1.4 K/uL — ABNORMAL HIGH (ref 0.1–1.0)
Monocytes Relative: 10 %
Neutro Abs: 9.4 K/uL — ABNORMAL HIGH (ref 1.7–7.7)
Neutrophils Relative %: 63 %
Platelets: 345 K/uL (ref 150–400)
RBC: 3.53 MIL/uL — ABNORMAL LOW (ref 4.22–5.81)
RDW: 14.4 % (ref 11.5–15.5)
WBC: 14.9 K/uL — ABNORMAL HIGH (ref 4.0–10.5)
nRBC: 0 % (ref 0.0–0.2)

## 2024-09-18 LAB — I-STAT CHEM 8, ED
BUN: 16 mg/dL (ref 8–23)
Calcium, Ion: 1.13 mmol/L — ABNORMAL LOW (ref 1.15–1.40)
Chloride: 102 mmol/L (ref 98–111)
Creatinine, Ser: 1.2 mg/dL (ref 0.61–1.24)
Glucose, Bld: 147 mg/dL — ABNORMAL HIGH (ref 70–99)
HCT: 36 % — ABNORMAL LOW (ref 39.0–52.0)
Hemoglobin: 12.2 g/dL — ABNORMAL LOW (ref 13.0–17.0)
Potassium: 3.8 mmol/L (ref 3.5–5.1)
Sodium: 139 mmol/L (ref 135–145)
TCO2: 24 mmol/L (ref 22–32)

## 2024-09-18 LAB — BRAIN NATRIURETIC PEPTIDE: B Natriuretic Peptide: 51.9 pg/mL (ref 0.0–100.0)

## 2024-09-18 LAB — I-STAT VENOUS BLOOD GAS, ED
Acid-Base Excess: 2 mmol/L (ref 0.0–2.0)
Bicarbonate: 26.9 mmol/L (ref 20.0–28.0)
Calcium, Ion: 1.11 mmol/L — ABNORMAL LOW (ref 1.15–1.40)
HCT: 36 % — ABNORMAL LOW (ref 39.0–52.0)
Hemoglobin: 12.2 g/dL — ABNORMAL LOW (ref 13.0–17.0)
O2 Saturation: 65 %
Potassium: 3.7 mmol/L (ref 3.5–5.1)
Sodium: 139 mmol/L (ref 135–145)
TCO2: 28 mmol/L (ref 22–32)
pCO2, Ven: 39.8 mmHg — ABNORMAL LOW (ref 44–60)
pH, Ven: 7.437 — ABNORMAL HIGH (ref 7.25–7.43)
pO2, Ven: 33 mmHg (ref 32–45)

## 2024-09-18 LAB — D-DIMER, QUANTITATIVE: D-Dimer, Quant: 1.5 ug{FEU}/mL — ABNORMAL HIGH (ref 0.00–0.50)

## 2024-09-18 MED ORDER — APIXABAN (ELIQUIS) VTE STARTER PACK (10MG AND 5MG): ORAL_TABLET | ORAL | 0 refills | Status: DC

## 2024-09-18 MED ORDER — IOHEXOL 350 MG/ML SOLN
75.0000 mL | Freq: Once | INTRAVENOUS | Status: AC | PRN
  Administered 2024-09-18: 75 mL via INTRAVENOUS

## 2024-09-18 MED ORDER — APIXABAN 5 MG PO TABS
10.0000 mg | ORAL_TABLET | Freq: Once | ORAL | Status: AC
  Administered 2024-09-18: 10 mg via ORAL
  Filled 2024-09-18: qty 2

## 2024-09-18 NOTE — Discharge Instructions (Addendum)
 Use your inhalers and nebulizers as needed for shortness of breath.  Take your blood thinners as prescribed, twice daily.  A starter pack for the first 30 days was sent to the pharmacy but is important that your primary care doctor continue to refill your Eliquis for at least 3 months.  Follow-up with them at your scheduled appointment Monday.  Return to the ER if you have any worsening chest pain or worsening shortness of breath.

## 2024-09-18 NOTE — ED Notes (Signed)
 Patient transported to CT

## 2024-09-18 NOTE — ED Provider Notes (Signed)
 Patient signed out to me at 7 AM pending CTA results.  Patient was here for chest pain and shortness of breath and treated for COPD exacerbation.  Troponins negative and he is feeling much better and asking to go home. Physical Exam  BP 136/78   Pulse (!) 112   Temp 98.6 F (37 C) (Oral)   Resp 18   Ht 5' 8 (1.727 m)   Wt 77.1 kg   SpO2 97%   BMI 25.85 kg/m   Physical Exam Vitals and nursing note reviewed.  Constitutional:      General: He is not in acute distress.    Appearance: He is well-developed.  HENT:     Head: Normocephalic and atraumatic.  Eyes:     Conjunctiva/sclera: Conjunctivae normal.  Cardiovascular:     Rate and Rhythm: Regular rhythm. Tachycardia present.     Heart sounds: No murmur heard. Pulmonary:     Effort: Pulmonary effort is normal. No respiratory distress.     Breath sounds: Normal breath sounds.  Abdominal:     Palpations: Abdomen is soft.     Tenderness: There is no abdominal tenderness.  Musculoskeletal:        General: No swelling.     Cervical back: Neck supple.  Skin:    General: Skin is warm and dry.     Capillary Refill: Capillary refill takes less than 2 seconds.  Neurological:     Mental Status: He is alert.  Psychiatric:        Mood and Affect: Mood normal.     Procedures  Procedures  ED Course / MDM   Clinical Course as of 09/18/24 1038  Sat Sep 18, 2024  0516 Chest pain and shortness of breath differential diagnosis considered and workup below.  Considering COPD exacerbation, will assess for pneumonia, pneumothorax.  Given recent surgical history and asymmetric leg swelling, PE also considered.  Will also assess for ACS/heart failure. [PC]  0659 CBC with Differential/Platelet(!) Leukocytosis and mild anemia close to prior baselines. [PC]  [id number] Comprehensive metabolic panel(!) No significant electrolyte derangements.  Mild hyperglycemia without DKA.  No renal insufficiency. [PC]  0659 Resp panel by RT-PCR (RSV, Flu A&B,  Covid) Anterior Nasal Swab Obtained to assess for COVID, influenza, RSV and negative. [PC]  0700 DG Chest Port 1 View No evidence of pneumonia, pneumothorax, pulmonary edema or pleural effusion. [PC]  0700 Brain natriuretic peptide Not concerning for CHF exacerbation [PC]  0700 EKG 12-Lead No acute ischemic changes, dysrhythmias.  Stable right bundle branch block. [PC]  0700 Troponin I (High Sensitivity) Initial Trope negative. [PC]  0700 D-dimer, quantitative(!) Elevated.  Will obtain a CTA to assess for pulmonary embolism [PC]  0701 Patient care turned over to oncoming provider. Patient case and results discussed in detail; please see their note for further ED managment.     [PC]    Clinical Course User Index [PC] Cardama, Raynell Moder, MD   Medical Decision Making Patient treated for COPD exacerbation and given nitro and feeling much better.  On my reevaluation he is still tachycardic but asking to go home.  Was found to have small PEs without any evidence of right heart strain and negative troponins.  Oxygen saturation is appropriate at 97% on room air.  I think is reasonable to start him on Eliquis at this time to treat him for VTE.  Was given 1 dose here and I sent a prescription for starter pack.  He has a follow-up with his primary  care doctor in 2 days.  He was given very strict return precautions advised to go to that appointment and stay compliant with his new blood thinner.  Advised to use his breathing treatments and inhaler as he has at home as needed.  He feels comfortable with the plan we discharged.  Problems Addressed: COPD exacerbation (HCC): acute illness or injury  Amount and/or Complexity of Data Reviewed Labs: ordered. Decision-making details documented in ED Course. Radiology: ordered. Decision-making details documented in ED Course. ECG/medicine tests:  Decision-making details documented in ED Course.  Risk OTC drugs. Prescription drug management. Drug  therapy requiring intensive monitoring for toxicity.          Gennaro Bouchard L, DO 09/18/24 1039

## 2024-09-18 NOTE — ED Provider Notes (Signed)
 [facility] Provider Note  CSN: [id number] Arrival date & time: 09/18/24 9495  Chief Complaint(s) Chest Pain and Shortness of Breath  HPI Gary Wright is a 78 y.o. male with a past medical history listed below including COPD not on supplemental oxygen, asthma, stress-induced cardiomyopathy, recent left hip replacement who presents to the emergency department with sudden onset chest pain and shortness of breath that awoke him from sleep.  No improvement with home breathing treatments.  When EMS arrived, patient was reportedly tripoding but satting 99% on room air.  EKG showed right bundle branch block.  Patient noted to have bilateral wheezing.  Given DuoNeb, and Solu-Medrol  and route.  Wheezing improved.  Chest pain also improved however have returned and route and patient was given 1 dose of nitroglycerin  which also improved his chest pain.  Patient reports that this pain is described as tightness.  Similar to prior COPD exacerbations.  Came on more suddenly.  No recent fevers or infections.  No cough or congestion.  No other physical complaints.    The history is provided by the patient.    Past Medical History Past Medical History:  Diagnosis Date   Acute dyspnea 03/24/2022   Acute exacerbation of chronic obstructive pulmonary disease (COPD) (HCC) 03/25/2022   Asthma    CAD (coronary artery disease) of artery bypass graft 06/13/2016   Chest pain    COPD (chronic obstructive pulmonary disease) (HCC)    COPD with acute exacerbation (HCC) 11/02/2020   Quit smokig 2019  - PFT's  121/22/23  FEV1 1.83 (60 % ) ratio 0.57  p 15 % improvement from saba p 0 prior to study with DLCO  16.4 (65%)   and FV curve mildly concave  - try off acei and on otc gerd rx 06/02/2023 >>>       Depression 09/01/2007   Qualifier: Diagnosis of  By: Nickola CMA, [place]     Elevated troponin 06/13/2016   History of lung cancer 11/02/2020   Hyperlipidemia  11/02/2020   Hypertension    Prolonged QT interval 11/02/2020   Stress-induced cardiomyopathy 06/13/2016   TOBACCO ABUSE 09/01/2007   Qualifier: Diagnosis of   By: Nickola CMA, [place]         Patient Active Problem List   Diagnosis Date Noted   Avascular necrosis of bone of left hip (HCC) 08/20/2024   Leukocytosis 08/05/2024   History of bacteremia 08/05/2024   Pneumonia of right upper lobe due to infectious organism 08/02/2024   SOB (shortness of breath) 06/12/2024   Chronic hip pain, bilateral 01/26/2024   Hoarseness 12/22/2023   Drug-induced diabetes mellitus 11/26/2023   Insomnia due to medical condition 11/26/2023   Fungal skin infection 11/04/2023   Asthma-COPD overlap syndrome (HCC) 11/04/2023   Chronic kidney disease, stage 3b (HCC) 07/31/2023   Allergic asthma 07/28/2023   Allergic rhinitis 07/17/2023   Former smoker 07/17/2023   Hypocalcemia 03/24/2022   Borderline type 2 diabetes mellitus 03/24/2022   COPD with acute exacerbation (HCC) 11/02/2020   History of lung cancer 11/02/2020   Hyperlipidemia 11/02/2020   Acute respiratory failure with hypoxia (HCC) 11/02/2020   Stress-induced cardiomyopathy 06/13/2016   History of pneumonia 06/13/2016   Opioid dependence (HCC) 06/13/2016   Mild CAD 06/13/2016   Depression 09/01/2007   Essential hypertension 09/01/2007   TONSILLECTOMY, HX OF 09/01/2007   Home Medication(s) Prior to Admission medications   Medication Sig Start Date End Date Taking? Authorizing Provider  acetaminophen  (TYLENOL )  325 MG tablet Take 1-2 tablets (325-650 mg total) by mouth every 6 (six) hours as needed for mild pain (pain score 1-3) or fever (or temp > 100.5). 08/31/24   Sherrill Cable Latif, DO  albuterol  (PROVENTIL ) (2.5 MG/3ML) 0.083% nebulizer solution Take 3 mLs (2.5 mg total) by nebulization every 4 (four) hours as needed for wheezing or shortness of breath. 06/09/24   [name] Dorn NOVAK, MD  Albuterol -Budesonide  (AIRSUPRA ) 90-80 MCG/ACT AERO  Inhale 2 puffs into the lungs 4 (four) times daily as needed. 09/09/24   Jesus Bernardino MATSU, MD  aspirin  EC 81 MG tablet Take 1 tablet (81 mg total) by mouth 2 (two) times daily. 09/09/24   Jesus Bernardino MATSU, MD  atorvastatin  (LIPITOR) 40 MG tablet TAKE 1 TABLET(40 MG) BY MOUTH DAILY AT 6 PM 08/18/23   Pietro Redell RAMAN, MD  buPROPion  (WELLBUTRIN  XL) 300 MG 24 hr tablet Take 300 mg by mouth daily. 05/11/16   [provider]  citalopram  (CELEXA ) 40 MG tablet Take 40 mg by mouth daily. 06/01/16   [provider]  cyclobenzaprine  (FLEXERIL ) 5 MG tablet Take 1 tablet (5 mg total) by mouth 3 (three) times daily as needed for muscle spasms. 03/23/24   Lucius Krabbe, NP  diclofenac  Sodium (VOLTAREN ) 1 % GEL Apply 4 g topically 4 (four) times daily as needed. Rub on sore joints. 08/09/24   Lucius Krabbe, NP  Dupilumab  (DUPIXENT ) 300 MG/2ML SOAJ Inject 300 mg into the skin every 14 (fourteen) days. 07/21/24   [name] Dorn NOVAK, MD  Fluticasone -Umeclidin-Vilant (TRELEGY ELLIPTA ) 200-62.5-25 MCG/ACT AEPB Inhale 1 puff into the lungs every evening. 09/09/24   Jesus Bernardino MATSU, MD  furosemide  (LASIX ) 20 MG tablet Take 1 tablet (20 mg total) by mouth daily. 08/15/24   Zackowski, Scott, MD  gabapentin  (NEURONTIN ) 300 MG capsule Take 1 capsule (300 mg total) by mouth 3 (three) times daily. Take for numbing nerve pain. sedating 09/09/24   Jesus Bernardino MATSU, MD  glipiZIDE  (GLUCOTROL  XL) 2.5 MG 24 hr tablet Take 1 tablet (2.5 mg total) by mouth as directed. Take 2 pills in the morning and 1 pill in the evening. 08/09/24   Lucius Krabbe, NP  HYDROcodone -acetaminophen  (NORCO) 10-325 MG tablet Take 1 tablet by mouth every 4 (four) hours as needed for up to 5 days. 09/17/24 09/22/24  Jesus Bernardino MATSU, MD  ipratropium-albuterol  (DUONEB) 0.5-2.5 (3) MG/3ML SOLN Take 3 mLs by nebulization 4 (four) times daily. 12/22/23   Darlean Ozell NOVAK, MD  ipratropium-albuterol  (DUONEB) 0.5-2.5 (3) MG/3ML SOLN Take 3 mLs  by nebulization every 6 (six) hours as needed. 09/09/24   Jesus Bernardino MATSU, MD  metoprolol  succinate (TOPROL -XL) 25 MG 24 hr tablet Take 0.5 tablets (12.5 mg total) by mouth See admin instructions. Take 1/2 tablet (12.5mg ) by mouth once daily at noon. 08/06/24   Arlon Carliss ORN, DO  olmesartan  (BENICAR ) 40 MG tablet Take 1 tablet (40 mg total) by mouth daily. 06/29/24 06/29/25  Darlean Ozell NOVAK, MD  ondansetron  (ZOFRAN ) 4 MG tablet Take 1 tablet (4 mg total) by mouth every 6 (six) hours as needed for nausea. 08/31/24   Sherrill Cable Latif, DO  oxyCODONE -acetaminophen  (PERCOCET) 5-325 MG tablet Take 1-2 tablets by mouth 2 (two) times daily as needed for severe pain (pain score 7-10). 08/27/24   Jerri Kay HERO, MD  pantoprazole  (PROTONIX ) 40 MG tablet One tablet by mouth daily 30 to 60 minutes before the first meal of the day Patient taking differently: Take 40 mg by mouth  daily as needed (heartburn, acid reflux). 05/26/24   Darlean Ozell NOVAK, MD  polyethylene glycol (MIRALAX  / GLYCOLAX ) 17 g packet Take 17 g by mouth daily as needed for moderate constipation. 08/31/24   Sheikh, Omair Latif, DO  senna-docusate (SENOKOT-S) 8.6-50 MG tablet Take 1 tablet by mouth at bedtime as needed for mild constipation. 08/31/24   Sheikh, Omair Latif, DO  triamcinolone  ointment (KENALOG ) 0.5 % Apply topically 2 (two) times daily. Patient taking differently: Apply 1 Application topically 2 (two) times daily as needed (skin irritaiton). 06/24/24   Lucius Krabbe, NP                                                                                                                                    Allergies Patient has no known allergies.  Review of Systems Review of Systems As noted in HPI  Physical Exam Vital Signs  I have reviewed the triage vital signs BP (!) 144/75   Pulse (!) 110   Temp 98.8 F (37.1 C) (Temporal)   Resp 11   Ht 5' 8 (1.727 m)   Wt 77.1 kg   SpO2 100%   BMI 25.85 kg/m   Physical  Exam Vitals reviewed.  Constitutional:      General: He is not in acute distress.    Appearance: He is well-developed. He is not diaphoretic.  HENT:     Head: Normocephalic and atraumatic.     Nose: Nose normal.  Eyes:     General: No scleral icterus.       Right eye: No discharge.        Left eye: No discharge.     Conjunctiva/sclera: Conjunctivae normal.     Pupils: Pupils are equal, round, and reactive to light.  Cardiovascular:     Rate and Rhythm: Regular rhythm.     Heart sounds: No murmur heard.    No friction rub. No gallop.  Pulmonary:     Effort: Pulmonary effort is normal. Tachypnea present. No respiratory distress.     Breath sounds: Normal breath sounds. No stridor. No wheezing, rhonchi or rales.  Abdominal:     General: There is no distension.     Palpations: Abdomen is soft.     Tenderness: There is no abdominal tenderness.  Musculoskeletal:        General: No tenderness.     Cervical back: Normal range of motion and neck supple.     Right lower leg: 1+ Edema present.     Left lower leg: 2+ Edema present.  Skin:    General: Skin is warm and dry.     Findings: No erythema or rash.  Neurological:     Mental Status: He is alert and oriented to person, place, and time.     ED Results and Treatments Labs (all labs ordered are listed, but only abnormal results are displayed) Labs Reviewed  COMPREHENSIVE METABOLIC PANEL WITH GFR -  Abnormal; Notable for the following components:      Result Value   Glucose, Bld 151 (*)    Calcium  8.6 (*)    Total Protein 5.6 (*)    Albumin 3.4 (*)    All other components within normal limits  CBC WITH DIFFERENTIAL/PLATELET - Abnormal; Notable for the following components:   WBC 14.9 (*)    RBC 3.53 (*)    Hemoglobin 11.3 (*)    HCT 35.4 (*)    MCV 100.3 (*)    Neutro Abs 9.4 (*)    Monocytes Absolute 1.4 (*)    Abs Immature Granulocytes 0.15 (*)    All other components within normal limits  D-DIMER, QUANTITATIVE -  Abnormal; Notable for the following components:   D-Dimer, Quant 1.50 (*)    All other components within normal limits  I-STAT CHEM 8, ED - Abnormal; Notable for the following components:   Glucose, Bld 147 (*)    Calcium , Ion 1.13 (*)    Hemoglobin 12.2 (*)    HCT 36.0 (*)    All other components within normal limits  I-STAT VENOUS BLOOD GAS, ED - Abnormal; Notable for the following components:   pH, Ven 7.437 (*)    pCO2, Ven 39.8 (*)    Calcium , Ion 1.11 (*)    HCT 36.0 (*)    Hemoglobin 12.2 (*)    All other components within normal limits  RESP PANEL BY RT-PCR (RSV, FLU A&B, COVID)  [id number]  BRAIN NATRIURETIC PEPTIDE  TROPONIN I (HIGH SENSITIVITY)  TROPONIN I (HIGH SENSITIVITY)                                                                                                                         EKG  EKG Interpretation Date/Time:  Saturday September 18 2024 05:17:30 EST Ventricular Rate:  111 PR Interval:  139 QRS Duration:  129 QT Interval:  350 QTC Calculation: 476 R Axis:   -61  Text Interpretation: Sinus tachycardia Sinus pause RBBB and LAFB Premature ventricular complexes Otherwise no significant change Since last tracing Confirmed by Trine Likes [phone]) on 09/18/2024 5:31:18 AM       Radiology DG Chest Port 1 View Result Date: 09/18/2024 EXAM: 1 VIEW(S) XRAY OF THE CHEST 09/18/2024 05:22:00 AM COMPARISON: Portable chest 08/18/2024. CLINICAL HISTORY: sob FINDINGS: LUNGS AND PLEURA: Lungs are emphysematous but clear. No focal pulmonary opacity. No pleural effusion. No pneumothorax. HEART AND MEDIASTINUM: The cardiomediastinal silhouette is normal, with aortic atherosclerosis. BONES AND SOFT TISSUES: Mild upper thoracic levoscoliosis and osteopenia. No new osseous abnormality. Advanced DJD of both shoulders. IMPRESSION: 1. No acute cardiopulmonary process. 2. Emphysema. Stable COPD chest. 3. Aortic atherosclerosis. Electronically signed by: Francis Quam MD 09/18/2024  05:31 AM EST RP Workstation: [id number]    Medications Ordered in ED Medications  iohexol  (OMNIPAQUE ) 350 MG/ML injection 75 mL (75 mLs Intravenous Contrast Given 09/18/24 0658)   Procedures Procedures  (including critical care time) Medical Decision Making / ED Course  Medical Decision Making Amount and/or Complexity of Data Reviewed Labs: ordered. Decision-making details documented in ED Course. Radiology: ordered and independent interpretation performed. Decision-making details documented in ED Course. ECG/medicine tests: ordered and independent interpretation performed. Decision-making details documented in ED Course.  Risk Prescription drug management. Decision regarding hospitalization.     Clinical Course as of 09/18/24 0732  Sat Sep 18, 2024  9483 Chest pain and shortness of breath differential diagnosis considered and workup below.  Considering COPD exacerbation, will assess for pneumonia, pneumothorax.  Given recent surgical history and asymmetric leg swelling, PE also considered.  Will also assess for ACS/heart failure. [PC]  0659 CBC with Differential/Platelet(!) Leukocytosis and mild anemia close to prior baselines. [PC]  [id number] Comprehensive metabolic panel(!) No significant electrolyte derangements.  Mild hyperglycemia without DKA.  No renal insufficiency. [PC]  0659 Resp panel by RT-PCR (RSV, Flu A&B, Covid) Anterior Nasal Swab Obtained to assess for COVID, influenza, RSV and negative. [PC]  0700 DG Chest Port 1 View No evidence of pneumonia, pneumothorax, pulmonary edema or pleural effusion. [PC]  0700 Brain natriuretic peptide Not concerning for CHF exacerbation [PC]  0700 EKG 12-Lead No acute ischemic changes, dysrhythmias.  Stable right bundle branch block. [PC]  0700 Troponin I (High Sensitivity) Initial Trope negative. [PC]  0700 D-dimer, quantitative(!) Elevated.  Will obtain a CTA to assess for pulmonary embolism [PC]  0701 Patient care turned over  to oncoming provider. Patient case and results discussed in detail; please see their note for further ED managment.     [PC]    Clinical Course User Index [PC] Stayce Delancy, Raynell Moder, MD    Final Clinical Impression(s) / ED Diagnoses Final diagnoses:  None    This chart was dictated using voice recognition software.  Despite best efforts to proofread,  errors can occur which can change the documentation meaning.    Trine Raynell Moder, MD 09/18/24 [phone]

## 2024-09-18 NOTE — ED Triage Notes (Signed)
 Patient BIB [facility] from home due to chest pain and shortness of breath. Patient self administered 1 duoneb at home prior to EMS arrival with no relief. Expiratory wheezing noted by EMS, relieved by 1 duoneb en route. 1 nitroglycerin  given en route, 125 solumedrol.  VSS w/ EMS, A&Ox4.

## 2024-09-18 NOTE — ED Notes (Signed)
 Pt given ice water.

## 2024-09-20 ENCOUNTER — Ambulatory Visit: Admitting: Family

## 2024-09-20 ENCOUNTER — Encounter: Payer: Self-pay | Admitting: Family

## 2024-09-20 VITALS — BP 118/62 | HR 95 | Temp 98.1°F | Ht 68.0 in | Wt 175.4 lb

## 2024-09-20 DIAGNOSIS — Z96641 Presence of right artificial hip joint: Secondary | ICD-10-CM

## 2024-09-20 DIAGNOSIS — I2699 Other pulmonary embolism without acute cor pulmonale: Secondary | ICD-10-CM | POA: Insufficient documentation

## 2024-09-20 DIAGNOSIS — E099 Drug or chemical induced diabetes mellitus without complications: Secondary | ICD-10-CM | POA: Diagnosis not present

## 2024-09-20 MED ORDER — GLIPIZIDE ER 5 MG PO TB24: 5.0000 mg | ORAL_TABLET | Freq: Two times a day (BID) | ORAL | 1 refills | Status: AC

## 2024-09-20 NOTE — Progress Notes (Unsigned)
 Patient ID: Rhea Thrun, male    DOB: 1946/01/30, 78 y.o.   MRN: [id number]  Chief Complaint  Patient presents with   Hip Pain   Hospitalization Follow-up   Diabetes  Discussed the use of AI scribe software for clinical note transcription with the patient, who gave verbal consent to proceed.  History of Present Illness Jvon Meroney is a 78 year old male with COPD and a recent pulmonary embolism who presents for follow-up after hospitalization.  He was hospitalized for about two and a half weeks for a pulmonary embolism confirmed by CT with blood clots in his lungs. He is taking a starter pack of anticoagulants as prescribed.  He has severe heartburn that started around the time the clots were found. He takes pantoprazole  and famotidine  for acid control.  He has advanced COPD treated with Trelegy, nebulizer treatments every six hours, and Proventil  inhaler. He notes the nebulizer is helpful.  He recently had surgery and has numbness and some pain at the surgical site. He is taking pain medication every four hours.  He has diabetes treated with glipizide , two pills in the morning and one in the evening. His A1c at the end of October was 7.6%. He was on prednisone , which raised his appetite and blood sugars, but has stopped it.  He takes metoprolol  and other blood pressure medications regularly, and furosemide  for swelling, mainly in the left foot. He uses a topical medication for a rash.  He takes bupropion  300 mg and citalopram  40 mg and follows with a psychiatrist yearly. He is thinking about tapering these medications but has not changed the doses. Assessment and Plan Assessment & Plan Pulmonary embolism Recent diagnosis with ongoing anticoagulation therapy. - Initiated anticoagulation therapy with a starter packet of two pills twice a day for a week, then reduce to one pill twice a day.  Chronic obstructive pulmonary disease (COPD) Severe COPD with recent  exacerbation requiring hospitalization. Currently on Trelegy, nebulizer medication every six hours, and Proventil  inhaler as needed. Pulmonologist involved in management. - Continue Trelegy one puff daily. - Use nebulizer medication every six hours. - Use Proventil  inhaler as needed. - Follow up with pulmonologist in three weeks.  Steroid induced diabetes mellitus Diabetes exacerbated by recent prednisone  use. A1c increased to 7.6%. Currently on glipizide  with recent increase in dosage to manage blood sugar levels. - Increased glipizide  to two pills twice a day. - Monitor blood sugar levels at home. - Aim to reduce A1c below 7% to protect kidney function.  Hypertension Managed with metoprolol  and Olmesartan  40mg  qd. - Continue current antihypertensive regimen.  Depression Managed with citalopram  and bupropion . Considering tapering off medications. Advised against abrupt discontinuation. - Consider tapering citalopram  and bupropion  by halving the dose for one to two months, monitoring for increased depression symptoms. - Consult with psychiatrist if needed.  Heartburn Managed with pantoprazole  40mg  qd and possibly famotidine ?. Refills available for pantoprazole . - Continue pantoprazole  as prescribed. - F/U in 6 mos  Lower extremity edema and rash Edema and rash present, with partial response to topical medication. Furosemide  prescribed for edema management while he was in hospital. - Continue furosemide  20mg  qam as prescribed. - Apply topical Triamcinolone  0.5% ointment twice a day for up to a week at a time. - Monitor for improvement and call office if not improving. - F/U in 3 mos  Status post right hip arthroplasty Recent right hip arthroplasty a month ago via ER with ongoing recovery. Sent to [facility]  for about 10d after. Experiencing numbness and pain, expected post-surgical symptoms. Rehabilitation services involved. - Monitor for improvement in pain and numbness. -  Follow up with surgical team on December 17th for wound evaluation.   Subjective:    Outpatient Medications Prior to Visit  Medication Sig Dispense Refill   acetaminophen  (TYLENOL ) 325 MG tablet Take 1-2 tablets (325-650 mg total) by mouth every 6 (six) hours as needed for mild pain (pain score 1-3) or fever (or temp > 100.5).     albuterol  (PROVENTIL ) (2.5 MG/3ML) 0.083% nebulizer solution Take 3 mLs (2.5 mg total) by nebulization every 4 (four) hours as needed for wheezing or shortness of breath. 360 mL 5   Albuterol -Budesonide  (AIRSUPRA ) 90-80 MCG/ACT AERO Inhale 2 puffs into the lungs 4 (four) times daily as needed. 32.1 g 1   APIXABAN (ELIQUIS) VTE STARTER PACK (10MG  AND 5MG ) Take as directed on package: start with two-5mg  tablets twice daily for 7 days. On day 8, switch to one-5mg  tablet twice daily. 74 each 0   aspirin  EC 81 MG tablet Take 1 tablet (81 mg total) by mouth 2 (two) times daily. 84 tablet 0   atorvastatin  (LIPITOR) 40 MG tablet TAKE 1 TABLET(40 MG) BY MOUTH DAILY AT 6 PM 90 tablet 3   buPROPion  (WELLBUTRIN  XL) 300 MG 24 hr tablet Take 300 mg by mouth daily.  1   citalopram  (CELEXA ) 40 MG tablet Take 40 mg by mouth daily.  0   cyclobenzaprine  (FLEXERIL ) 5 MG tablet Take 1 tablet (5 mg total) by mouth 3 (three) times daily as needed for muscle spasms. 30 tablet 5   diclofenac  Sodium (VOLTAREN ) 1 % GEL Apply 4 g topically 4 (four) times daily as needed. Rub on sore joints. 100 g 3   Dupilumab  (DUPIXENT ) 300 MG/2ML SOAJ Inject 300 mg into the skin every 14 (fourteen) days. 4 mL 3   Fluticasone -Umeclidin-Vilant (TRELEGY ELLIPTA ) 200-62.5-25 MCG/ACT AEPB Inhale 1 puff into the lungs every evening. 1 each 1   furosemide  (LASIX ) 20 MG tablet Take 1 tablet (20 mg total) by mouth daily. 14 tablet 0   gabapentin  (NEURONTIN ) 300 MG capsule Take 1 capsule (300 mg total) by mouth 3 (three) times daily. Take for numbing nerve pain. sedating 30 capsule 1   glipiZIDE  (GLUCOTROL  XL) 2.5 MG 24  hr tablet Take 1 tablet (2.5 mg total) by mouth as directed. Take 2 pills in the morning and 1 pill in the evening. 90 tablet 2   HYDROcodone -acetaminophen  (NORCO) 10-325 MG tablet Take 1 tablet by mouth every 4 (four) hours as needed for up to 5 days. 30 tablet 0   ipratropium-albuterol  (DUONEB) 0.5-2.5 (3) MG/3ML SOLN Take 3 mLs by nebulization 4 (four) times daily. 360 mL 11   ipratropium-albuterol  (DUONEB) 0.5-2.5 (3) MG/3ML SOLN Take 3 mLs by nebulization every 6 (six) hours as needed. 360 mL 5   metoprolol  succinate (TOPROL -XL) 25 MG 24 hr tablet Take 0.5 tablets (12.5 mg total) by mouth See admin instructions. Take 1/2 tablet (12.5mg ) by mouth once daily at noon.     olmesartan  (BENICAR ) 40 MG tablet Take 1 tablet (40 mg total) by mouth daily. 30 tablet 11   ondansetron  (ZOFRAN ) 4 MG tablet Take 1 tablet (4 mg total) by mouth every 6 (six) hours as needed for nausea.     oxyCODONE -acetaminophen  (PERCOCET) 5-325 MG tablet Take 1-2 tablets by mouth 2 (two) times daily as needed for severe pain (pain score 7-10). 20 tablet 0   polyethylene  glycol (MIRALAX  / GLYCOLAX ) 17 g packet Take 17 g by mouth daily as needed for moderate constipation.     senna-docusate (SENOKOT-S) 8.6-50 MG tablet Take 1 tablet by mouth at bedtime as needed for mild constipation.     pantoprazole  (PROTONIX ) 40 MG tablet One tablet by mouth daily 30 to 60 minutes before the first meal of the day (Patient taking differently: Take 40 mg by mouth daily as needed (heartburn, acid reflux).) 90 tablet 1   triamcinolone  ointment (KENALOG ) 0.5 % Apply topically 2 (two) times daily. (Patient taking differently: Apply 1 Application topically 2 (two) times daily as needed (skin irritaiton).) 30 g 11   No facility-administered medications prior to visit.   Past Medical History:  Diagnosis Date   Acute dyspnea 03/24/2022   Acute exacerbation of chronic obstructive pulmonary disease (COPD) (HCC) 03/25/2022   Asthma    CAD (coronary  artery disease) of artery bypass graft 06/13/2016   Chest pain    COPD (chronic obstructive pulmonary disease) (HCC)    COPD with acute exacerbation (HCC) 11/02/2020   Quit smokig 2019  - PFT's  121/22/23  FEV1 1.83 (60 % ) ratio 0.57  p 15 % improvement from saba p 0 prior to study with DLCO  16.4 (65%)   and FV curve mildly concave  - try off acei and on otc gerd rx 06/02/2023 >>>       Depression 09/01/2007   Qualifier: Diagnosis of  By: Nickola CMA, [place]     Elevated troponin 06/13/2016   History of lung cancer 11/02/2020   Hyperlipidemia 11/02/2020   Hypertension    Prolonged QT interval 11/02/2020   Stress-induced cardiomyopathy 06/13/2016   TOBACCO ABUSE 09/01/2007   Qualifier: Diagnosis of   By: Nickola CMA, [place]         Past Surgical History:  Procedure Laterality Date   BACK SURGERY     fusion in 1973   CARDIAC CATHETERIZATION N/A 06/13/2016   Procedure: Left Heart Cath and Coronary Angiography;  Surgeon: Lonni Hanson, MD;  Location: [facility] INVASIVE CV LAB;  Service: Cardiovascular;  Laterality: N/A;   INGUINAL HERNIA REPAIR Right 04/21/2018   Procedure: RIGHT INGUINAL HERNIA REPAIR ERAS PATHWAY;  Surgeon: Vanderbilt Ned, MD;  Location: [facility] OR;  Service: General;  Laterality: Right;  TAP BLOCK   INSERTION OF MESH Right 04/21/2018   Procedure: INSERTION OF MESH;  Surgeon: Vanderbilt Ned, MD;  Location: [facility] OR;  Service: General;  Laterality: Right;   LUNG REMOVAL, PARTIAL Left    left upper lung removed about 6-7 years ago reported by pt   TONSILLECTOMY     TOTAL HIP ARTHROPLASTY Left 08/27/2024   Procedure: ARTHROPLASTY, HIP, TOTAL, ANTERIOR APPROACH;  Surgeon: Jerri Kay HERO, MD;  Location: [facility] OR;  Service: Orthopedics;  Laterality: Left;   No Known Allergies    Objective:    Physical Exam Vitals and nursing note reviewed.  Constitutional:      General: He is not in acute distress.    Appearance: Normal appearance.  HENT:     Head: Normocephalic.  Cardiovascular:      Rate and Rhythm: Normal rate and regular rhythm.  Pulmonary:     Effort: Pulmonary effort is normal.     Breath sounds: Normal breath sounds.  Musculoskeletal:        General: Normal range of motion.     Cervical back: Normal range of motion.  Skin:    General: Skin is warm and dry.  Neurological:  Mental Status: He is alert and oriented to person, place, and time.  Psychiatric:        Mood and Affect: Mood normal.    BP 118/62 (BP Location: Left Arm, Patient Position: Sitting, Cuff Size: Large)   Pulse 95   Temp 98.1 F (36.7 C) (Temporal)   Ht 5' 8 (1.727 m)   Wt 175 lb 6 oz (79.5 kg)   SpO2 99%   BMI 26.67 kg/m  Wt Readings from Last 3 Encounters:  09/20/24 175 lb 6 oz (79.5 kg)  09/18/24 170 lb (77.1 kg)  08/18/24 181 lb (82.1 kg)       Lucius Krabbe, NP

## 2024-09-21 ENCOUNTER — Other Ambulatory Visit (HOSPITAL_COMMUNITY): Payer: Self-pay

## 2024-09-21 ENCOUNTER — Ambulatory Visit (HOSPITAL_COMMUNITY)

## 2024-09-21 ENCOUNTER — Other Ambulatory Visit: Payer: Self-pay

## 2024-09-21 NOTE — Progress Notes (Signed)
 Specialty Pharmacy Refill Coordination Note  Gary Wright is a 78 y.o. male contacted today regarding refills of specialty medication(s) Dupilumab  (Dupixent )   Patient requested Delivery   Delivery date: 09/24/24   Verified address: [address]   [place] [address]   Medication will be filled on: 09/23/24

## 2024-09-22 ENCOUNTER — Other Ambulatory Visit: Payer: Self-pay

## 2024-09-22 ENCOUNTER — Telehealth: Payer: Self-pay | Admitting: Family

## 2024-09-22 NOTE — Telephone Encounter (Signed)
 Copied from CRM #[id number]. Topic: Clinical - Medication Refill >> Sep 22, 2024 11:08 AM Franky GRADE wrote: Medication: HYDROcodone -acetaminophen  (NORCO) 10-325 MG tablet [[id number]]  Has the patient contacted their pharmacy? No (Agent: If no, request that the patient contact the pharmacy for the refill. If patient does not wish to contact the pharmacy document the reason why and proceed with request.) (Agent: If yes, when and what did the pharmacy advise?)  This is the patient's preferred pharmacy:  [id number] [place], [place] - [address] AT [address] [facility] [address] & [facility] [address] [address] [facility] [address] [place] [address] Phone: [phone] Fax: [phone]  Is this the correct pharmacy for this prescription? Yes If no, delete pharmacy and type the correct one.   Has the prescription been filled recently? No  Is the patient out of the medication? Yes  Has the patient been seen for an appointment in the last year OR does the patient have an upcoming appointment? Yes  Can we respond through MyChart? No  Agent: Please be advised that Rx refills may take up to 3 business days. We ask that you follow-up with your pharmacy.

## 2024-09-23 ENCOUNTER — Other Ambulatory Visit: Payer: Self-pay

## 2024-09-23 ENCOUNTER — Telehealth: Payer: Self-pay

## 2024-09-23 ENCOUNTER — Ambulatory Visit (HOSPITAL_COMMUNITY)

## 2024-09-23 NOTE — Telephone Encounter (Signed)
 Unable to pend

## 2024-09-23 NOTE — Telephone Encounter (Signed)
 Copied from CRM #[id number]. Topic: Clinical - Medication Question >> Sep 23, 2024  2:42 PM Aisha D wrote: Reason for CRM: Pt is calling to get an update on the refill request for the oxyCODONE -acetaminophen  (PERCOCET) 5-325 MG tablet. It shows it was refused due to the prescriber not being in the office but the provider is a visiting provider at the office. Pt is out of medication and would like a callback with an update today if possible.  UPDATE: On the refused refill request it shows the authorizing provider as Kenney Roys, FNP, but when I clicked on the meds list it shows a different provider.

## 2024-09-23 NOTE — Progress Notes (Signed)
 Clinical Intervention Note  Clinical Intervention Notes: Patient reported starting Eliquis , no DDIs identified with her Dupixent .   Clinical Intervention Outcomes: Prevention of an adverse drug event   Silvano LOISE Blair Karel Santa

## 2024-09-24 NOTE — Telephone Encounter (Signed)
 I reached out to patient in regards to PCP recommendations. I was unable to reach patient at this time but I was able to leave a voicemail.

## 2024-09-24 NOTE — Telephone Encounter (Signed)
 pt needs to go to his orthopedic provider for ongoing pain meds or be referred to a pain clinic.

## 2024-09-24 NOTE — Telephone Encounter (Signed)
 again, any narcotic pain meds need to come from ortho or pain clinic, thx.

## 2024-09-26 ENCOUNTER — Observation Stay (HOSPITAL_COMMUNITY)
Admission: EM | Admit: 2024-09-26 | Discharge: 2024-10-02 | Disposition: A | Source: Home / Self Care | Attending: Emergency Medicine | Admitting: Emergency Medicine

## 2024-09-26 ENCOUNTER — Observation Stay (HOSPITAL_COMMUNITY)

## 2024-09-26 ENCOUNTER — Other Ambulatory Visit: Payer: Self-pay

## 2024-09-26 ENCOUNTER — Encounter (HOSPITAL_COMMUNITY): Payer: Self-pay | Admitting: Emergency Medicine

## 2024-09-26 ENCOUNTER — Emergency Department (HOSPITAL_COMMUNITY)

## 2024-09-26 DIAGNOSIS — J441 Chronic obstructive pulmonary disease with (acute) exacerbation: Secondary | ICD-10-CM | POA: Diagnosis not present

## 2024-09-26 DIAGNOSIS — D649 Anemia, unspecified: Secondary | ICD-10-CM | POA: Diagnosis present

## 2024-09-26 DIAGNOSIS — I2510 Atherosclerotic heart disease of native coronary artery without angina pectoris: Secondary | ICD-10-CM | POA: Diagnosis present

## 2024-09-26 DIAGNOSIS — Z86711 Personal history of pulmonary embolism: Secondary | ICD-10-CM | POA: Diagnosis present

## 2024-09-26 DIAGNOSIS — E099 Drug or chemical induced diabetes mellitus without complications: Secondary | ICD-10-CM | POA: Diagnosis present

## 2024-09-26 DIAGNOSIS — I10 Essential (primary) hypertension: Secondary | ICD-10-CM | POA: Diagnosis present

## 2024-09-26 DIAGNOSIS — F341 Dysthymic disorder: Secondary | ICD-10-CM

## 2024-09-26 DIAGNOSIS — I5189 Other ill-defined heart diseases: Secondary | ICD-10-CM

## 2024-09-26 DIAGNOSIS — R9431 Abnormal electrocardiogram [ECG] [EKG]: Secondary | ICD-10-CM | POA: Diagnosis present

## 2024-09-26 DIAGNOSIS — F32A Depression, unspecified: Secondary | ICD-10-CM | POA: Diagnosis present

## 2024-09-26 DIAGNOSIS — E785 Hyperlipidemia, unspecified: Secondary | ICD-10-CM | POA: Diagnosis present

## 2024-09-26 DIAGNOSIS — D72829 Elevated white blood cell count, unspecified: Secondary | ICD-10-CM | POA: Diagnosis not present

## 2024-09-26 DIAGNOSIS — R5381 Other malaise: Secondary | ICD-10-CM | POA: Diagnosis present

## 2024-09-26 DIAGNOSIS — R0603 Acute respiratory distress: Secondary | ICD-10-CM | POA: Diagnosis present

## 2024-09-26 LAB — CBC
HCT: 37.6 % — ABNORMAL LOW (ref 39.0–52.0)
Hemoglobin: 12.2 g/dL — ABNORMAL LOW (ref 13.0–17.0)
MCH: 31.7 pg (ref 26.0–34.0)
MCHC: 32.4 g/dL (ref 30.0–36.0)
MCV: 97.7 fL (ref 80.0–100.0)
Platelets: 365 K/uL (ref 150–400)
RBC: 3.85 MIL/uL — ABNORMAL LOW (ref 4.22–5.81)
RDW: 13.7 % (ref 11.5–15.5)
WBC: 15.2 K/uL — ABNORMAL HIGH (ref 4.0–10.5)
nRBC: 0 % (ref 0.0–0.2)

## 2024-09-26 LAB — BRAIN NATRIURETIC PEPTIDE: B Natriuretic Peptide: 57.7 pg/mL (ref 0.0–100.0)

## 2024-09-26 LAB — RESP PANEL BY RT-PCR (RSV, FLU A&B, COVID)  RVPGX2
Influenza A by PCR: NEGATIVE
Influenza B by PCR: NEGATIVE
Resp Syncytial Virus by PCR: NEGATIVE
SARS Coronavirus 2 by RT PCR: NEGATIVE

## 2024-09-26 LAB — I-STAT CHEM 8, ED
BUN: 12 mg/dL (ref 8–23)
Calcium, Ion: 1.08 mmol/L — ABNORMAL LOW (ref 1.15–1.40)
Chloride: 100 mmol/L (ref 98–111)
Creatinine, Ser: 1.1 mg/dL (ref 0.61–1.24)
Glucose, Bld: 142 mg/dL — ABNORMAL HIGH (ref 70–99)
HCT: 37 % — ABNORMAL LOW (ref 39.0–52.0)
Hemoglobin: 12.6 g/dL — ABNORMAL LOW (ref 13.0–17.0)
Potassium: 3.5 mmol/L (ref 3.5–5.1)
Sodium: 136 mmol/L (ref 135–145)
TCO2: 20 mmol/L — ABNORMAL LOW (ref 22–32)

## 2024-09-26 LAB — BASIC METABOLIC PANEL WITH GFR
Anion gap: 16 — ABNORMAL HIGH (ref 5–15)
BUN: 11 mg/dL (ref 8–23)
CO2: 21 mmol/L — ABNORMAL LOW (ref 22–32)
Calcium: 8.7 mg/dL — ABNORMAL LOW (ref 8.9–10.3)
Chloride: 99 mmol/L (ref 98–111)
Creatinine, Ser: 1.24 mg/dL (ref 0.61–1.24)
GFR, Estimated: 60 mL/min — ABNORMAL LOW (ref >60)
Glucose, Bld: 143 mg/dL — ABNORMAL HIGH (ref 70–99)
Potassium: 3.6 mmol/L (ref 3.5–5.1)
Sodium: 136 mmol/L (ref 135–145)

## 2024-09-26 LAB — PROCALCITONIN: Procalcitonin: <0.1 ng/mL

## 2024-09-26 LAB — GLUCOSE, CAPILLARY: Glucose-Capillary: 192 mg/dL — ABNORMAL HIGH (ref 70–99)

## 2024-09-26 LAB — TROPONIN I (HIGH SENSITIVITY)
Troponin I (High Sensitivity): 12 ng/L (ref <18)
Troponin I (High Sensitivity): 12 ng/L (ref <18)

## 2024-09-26 MED ORDER — BUPROPION HCL ER (XL) 150 MG PO TB24
300.0000 mg | ORAL_TABLET | Freq: Every day | ORAL | Status: DC
  Administered 2024-09-26 – 2024-10-02 (×7): 300 mg via ORAL
  Filled 2024-09-26 (×7): qty 2

## 2024-09-26 MED ORDER — LORAZEPAM 2 MG/ML IJ SOLN: 0.5000 mg | Freq: Four times a day (QID) | INTRAMUSCULAR | Status: DC | PRN

## 2024-09-26 MED ORDER — ALBUTEROL SULFATE (2.5 MG/3ML) 0.083% IN NEBU: 7.5000 mg/h | INHALATION_SOLUTION | Freq: Once | RESPIRATORY_TRACT | Status: DC

## 2024-09-26 MED ORDER — ACETAMINOPHEN 650 MG RE SUPP: 650.0000 mg | Freq: Four times a day (QID) | RECTAL | Status: DC | PRN

## 2024-09-26 MED ORDER — SODIUM CHLORIDE 0.9% FLUSH
3.0000 mL | Freq: Two times a day (BID) | INTRAVENOUS | Status: DC
  Administered 2024-09-26 – 2024-10-01 (×10): 3 mL via INTRAVENOUS

## 2024-09-26 MED ORDER — SODIUM CHLORIDE 0.9 % IV BOLUS
1000.0000 mL | Freq: Once | INTRAVENOUS | Status: AC
  Administered 2024-09-26: 1000 mL via INTRAVENOUS

## 2024-09-26 MED ORDER — CITALOPRAM HYDROBROMIDE 20 MG PO TABS
40.0000 mg | ORAL_TABLET | Freq: Every day | ORAL | Status: DC
  Administered 2024-09-26 – 2024-09-28 (×3): 40 mg via ORAL
  Filled 2024-09-26 (×3): qty 2

## 2024-09-26 MED ORDER — SODIUM CHLORIDE 0.9 % IV BOLUS
500.0000 mL | Freq: Once | INTRAVENOUS | Status: AC
  Administered 2024-09-26: 500 mL via INTRAVENOUS

## 2024-09-26 MED ORDER — INSULIN ASPART 100 UNIT/ML IJ SOLN
0.0000 [IU] | Freq: Every day | INTRAMUSCULAR | Status: DC
  Administered 2024-10-01: 3 [IU] via SUBCUTANEOUS
  Filled 2024-09-26: qty 5

## 2024-09-26 MED ORDER — TRIMETHOBENZAMIDE HCL 100 MG/ML IM SOLN
200.0000 mg | Freq: Four times a day (QID) | INTRAMUSCULAR | Status: DC | PRN
  Administered 2024-09-26 – 2024-09-27 (×3): 200 mg via INTRAMUSCULAR
  Filled 2024-09-26 (×5): qty 2

## 2024-09-26 MED ORDER — ATORVASTATIN CALCIUM 40 MG PO TABS
40.0000 mg | ORAL_TABLET | Freq: Every evening | ORAL | Status: DC
  Administered 2024-09-26 – 2024-10-01 (×6): 40 mg via ORAL
  Filled 2024-09-26 (×6): qty 1

## 2024-09-26 MED ORDER — ARFORMOTEROL TARTRATE 15 MCG/2ML IN NEBU
15.0000 ug | INHALATION_SOLUTION | Freq: Two times a day (BID) | RESPIRATORY_TRACT | Status: DC
  Administered 2024-09-26 – 2024-10-01 (×10): 15 ug via RESPIRATORY_TRACT
  Filled 2024-09-26 (×10): qty 2

## 2024-09-26 MED ORDER — IPRATROPIUM-ALBUTEROL 0.5-2.5 (3) MG/3ML IN SOLN
3.0000 mL | Freq: Once | RESPIRATORY_TRACT | Status: AC
  Administered 2024-09-26: 3 mL via RESPIRATORY_TRACT
  Filled 2024-09-26: qty 3

## 2024-09-26 MED ORDER — INSULIN ASPART 100 UNIT/ML IJ SOLN
0.0000 [IU] | Freq: Three times a day (TID) | INTRAMUSCULAR | Status: DC
  Administered 2024-09-27 (×2): 2 [IU] via SUBCUTANEOUS
  Administered 2024-09-28: 3 [IU] via SUBCUTANEOUS
  Administered 2024-09-28: 1 [IU] via SUBCUTANEOUS
  Administered 2024-09-29 (×3): 2 [IU] via SUBCUTANEOUS
  Administered 2024-10-01: 3 [IU] via SUBCUTANEOUS
  Administered 2024-10-02: 2 [IU] via SUBCUTANEOUS
  Filled 2024-09-26: qty 2
  Filled 2024-09-26: qty 3
  Filled 2024-09-26: qty 2
  Filled 2024-09-26: qty 1
  Filled 2024-09-26: qty 2
  Filled 2024-09-26 (×2): qty 3
  Filled 2024-09-26 (×2): qty 2
  Filled 2024-09-26: qty 3

## 2024-09-26 MED ORDER — PREDNISONE 20 MG PO TABS
40.0000 mg | ORAL_TABLET | Freq: Every day | ORAL | Status: DC
  Administered 2024-09-27: 40 mg via ORAL
  Filled 2024-09-26: qty 2

## 2024-09-26 MED ORDER — ACETAMINOPHEN 325 MG PO TABS
650.0000 mg | ORAL_TABLET | Freq: Four times a day (QID) | ORAL | Status: DC | PRN
  Administered 2024-09-27: 650 mg via ORAL
  Filled 2024-09-26: qty 2

## 2024-09-26 MED ORDER — IPRATROPIUM-ALBUTEROL 0.5-2.5 (3) MG/3ML IN SOLN
3.0000 mL | Freq: Four times a day (QID) | RESPIRATORY_TRACT | Status: DC
  Administered 2024-09-26 – 2024-09-27 (×2): 3 mL via RESPIRATORY_TRACT
  Filled 2024-09-26 (×3): qty 3

## 2024-09-26 MED ORDER — IRBESARTAN 300 MG PO TABS
300.0000 mg | ORAL_TABLET | Freq: Every day | ORAL | Status: DC
  Administered 2024-09-26 – 2024-10-01 (×6): 300 mg via ORAL
  Filled 2024-09-26 (×7): qty 1

## 2024-09-26 MED ORDER — IOHEXOL 350 MG/ML SOLN
75.0000 mL | Freq: Once | INTRAVENOUS | Status: AC | PRN
  Administered 2024-09-26: 75 mL via INTRAVENOUS

## 2024-09-26 MED ORDER — APIXABAN (ELIQUIS) VTE STARTER PACK (10MG AND 5MG): 5.0000 mg | ORAL_TABLET | Freq: Two times a day (BID) | ORAL | Status: DC

## 2024-09-26 MED ORDER — ALBUTEROL SULFATE (2.5 MG/3ML) 0.083% IN NEBU
2.5000 mg | INHALATION_SOLUTION | RESPIRATORY_TRACT | Status: DC | PRN
  Administered 2024-09-26 – 2024-10-02 (×7): 2.5 mg via RESPIRATORY_TRACT
  Filled 2024-09-26 (×9): qty 3

## 2024-09-26 MED ORDER — APIXABAN 5 MG PO TABS
5.0000 mg | ORAL_TABLET | Freq: Two times a day (BID) | ORAL | Status: DC
  Administered 2024-09-28: 5 mg via ORAL
  Filled 2024-09-26: qty 1

## 2024-09-26 MED ORDER — APIXABAN 5 MG PO TABS
10.0000 mg | ORAL_TABLET | Freq: Two times a day (BID) | ORAL | Status: AC
  Administered 2024-09-26 – 2024-09-27 (×4): 10 mg via ORAL
  Filled 2024-09-26 (×4): qty 2

## 2024-09-26 MED ORDER — METOPROLOL SUCCINATE ER 25 MG PO TB24
12.5000 mg | ORAL_TABLET | Freq: Every day | ORAL | Status: DC
  Administered 2024-09-27: 12.5 mg via ORAL
  Filled 2024-09-26: qty 1

## 2024-09-26 MED ORDER — MELATONIN 3 MG PO TABS
3.0000 mg | ORAL_TABLET | Freq: Once | ORAL | Status: AC
  Administered 2024-09-26: 3 mg via ORAL
  Filled 2024-09-26: qty 1

## 2024-09-26 MED ORDER — LORAZEPAM 2 MG/ML IJ SOLN
0.5000 mg | Freq: Four times a day (QID) | INTRAMUSCULAR | Status: AC | PRN
  Administered 2024-09-26 (×2): 0.5 mg via INTRAVENOUS
  Filled 2024-09-26 (×2): qty 1

## 2024-09-26 MED ORDER — LEVOFLOXACIN 750 MG PO TABS
750.0000 mg | ORAL_TABLET | Freq: Once | ORAL | Status: AC
  Administered 2024-09-26: 750 mg via ORAL
  Filled 2024-09-26: qty 1

## 2024-09-26 MED ORDER — BUDESONIDE 0.5 MG/2ML IN SUSP
0.5000 mg | Freq: Two times a day (BID) | RESPIRATORY_TRACT | Status: DC
  Administered 2024-09-26 – 2024-10-01 (×10): 0.5 mg via RESPIRATORY_TRACT
  Filled 2024-09-26 (×10): qty 2

## 2024-09-26 NOTE — ED Triage Notes (Addendum)
 Pt reports left upper lobe being removed. Pt also reports being diagnosed with blood clots in his lung last week. Pt on eliquis .

## 2024-09-26 NOTE — H&P (Signed)
 History and Physical    Patient: Gary Wright FMW:[id number] DOB: 1946-01-24 DOA: 09/26/2024 DOS: the patient was seen and examined on 09/26/2024 PCP: Lucius Krabbe, NP  Patient coming from: Home  Chief Complaint:  Chief Complaint  Patient presents with   Respiratory Distress   HPI: Gary Wright is a 78 y.o. male with medical history significant of prior tobacco use, stage IV COPD, CAD s/p CABG, HTN, HLD,  diabetes mellitus, history of pulmonary embolism, chronic avascular necrosis of the left hip s/p  total hip arthroplasty on 11/7 presents with difficulty breathing.  He had just recently been seen in the emergency department on 11/29 and found to have multiple subsegmental pulmonary emboli for which she was started on Eliquis .  Thought likely provoked following recent hospitalization from 10/29-11/11 found to have acute on chronic left hip pain with osteonecrosis of the left hip status post total hip arthroplasty.    Review of records note that he had followed up with his primary on 12/1 and at that time not started Eliquis  as of yet.   Since that visit patient reports that he has been taking the Eliquis  as prescribed.  However he began having progressive worsening shortness of breath.  He was not set up with oxygen at home as his oxygen levels have always been good. However, he was using a nebulizer frequently, more than the recommended once every six hours, as his wife administered it to him. Despite this, his breathing worsened over the last couple of days, leading him to call for an ambulance. he experienced significant difficulty breathing, stating, 'I just couldn't breathe anymore.'  He reports experiencing chills but denies fever or leg swelling. He continues to find it hard to breathe.  Denies having any significant chest pain.  In route with EMS patient  was given 2 DuoNeb breathing treatments Solu-Medrol  125 mg IV, and magnesium  sulfate 2 g IV  In the  emergency department patient was noted to be afebrile with pulse 121-129, and O2 saturation currently maintained on 2 L nasal cannula oxygen.  Labs noted WBC 15.2, hemoglobin 12.6, CO2 21, BUN 11, creatinine 1.24, and anion gap 16.  Chest x-ray showed no acute abnormality.  Patient was bolused 1.5 L of normal saline IV fluids and given Levaquin  750 mg p.o. CT angiogram of the chest was obtained which noted stable pulmonary emboli.  Patient was unable to ambulate more than a couple feet prior to tiring out with O2 saturations maintained.   Review of Systems: As mentioned in the history of present illness. All other systems reviewed and are negative. Past Medical History:  Diagnosis Date   Acute dyspnea 03/24/2022   Acute exacerbation of chronic obstructive pulmonary disease (COPD) (HCC) 03/25/2022   Asthma    CAD (coronary artery disease) of artery bypass graft 06/13/2016   Chest pain    COPD (chronic obstructive pulmonary disease) (HCC)    COPD with acute exacerbation (HCC) 11/02/2020   Quit smokig 2019  - PFT's  121/22/23  FEV1 1.83 (60 % ) ratio 0.57  p 15 % improvement from saba p 0 prior to study with DLCO  16.4 (65%)   and FV curve mildly concave  - try off acei and on otc gerd rx 06/02/2023 >>>       Depression 09/01/2007   Qualifier: Diagnosis of  By: Nickola CMA, [place]     Elevated troponin 06/13/2016   History of lung cancer 11/02/2020   Hyperlipidemia 11/02/2020   Hypertension  Hypocalcemia 03/24/2022   Leukocytosis 08/05/2024   Prolonged QT interval 11/02/2020   SOB (shortness of breath) 06/12/2024   Stress-induced cardiomyopathy 06/13/2016   TOBACCO ABUSE 09/01/2007   Qualifier: Diagnosis of   By: Nickola CMA, [place]         Past Surgical History:  Procedure Laterality Date   BACK SURGERY     fusion in 1973   CARDIAC CATHETERIZATION N/A 06/13/2016   Procedure: Left Heart Cath and Coronary Angiography;  Surgeon: Lonni Hanson, MD;  Location: [facility];   Service: Cardiovascular;  Laterality: N/A;   INGUINAL HERNIA REPAIR Right 04/21/2018   Procedure: RIGHT INGUINAL HERNIA REPAIR ERAS PATHWAY;  Surgeon: Vanderbilt Ned, MD;  Location: [facility] OR;  Service: General;  Laterality: Right;  TAP BLOCK   INSERTION OF MESH Right 04/21/2018   Procedure: INSERTION OF MESH;  Surgeon: Vanderbilt Ned, MD;  Location: [facility] OR;  Service: General;  Laterality: Right;   LUNG REMOVAL, PARTIAL Left    left upper lung removed about 6-7 years ago reported by pt   TONSILLECTOMY     TOTAL HIP ARTHROPLASTY Left 08/27/2024   Procedure: ARTHROPLASTY, HIP, TOTAL, ANTERIOR APPROACH;  Surgeon: Jerri Kay HERO, MD;  Location: [facility] OR;  Service: Orthopedics;  Laterality: Left;   Social History:  reports that he quit smoking about 6 years ago. His smoking use included cigarettes. He has never used smokeless tobacco. He reports that he does not drink alcohol and does not use drugs.  No Known Allergies  Family History  Problem Relation Age of Onset   Hypertension Mother    Hypertension Father     Prior to Admission medications   Medication Sig Start Date End Date Taking? Authorizing Provider  acetaminophen  (TYLENOL ) 325 MG tablet Take 1-2 tablets (325-650 mg total) by mouth every 6 (six) hours as needed for mild pain (pain score 1-3) or fever (or temp > 100.5). 08/31/24   Sherrill Cable Latif, DO  albuterol  (PROVENTIL ) (2.5 MG/3ML) 0.083% nebulizer solution Take 3 mLs (2.5 mg total) by nebulization every 4 (four) hours as needed for wheezing or shortness of breath. 06/09/24   [name] Dorn NOVAK, MD  Albuterol -Budesonide  (AIRSUPRA ) 90-80 MCG/ACT AERO Inhale 2 puffs into the lungs 4 (four) times daily as needed. 09/09/24   Jesus Bernardino MATSU, MD  APIXABAN  (ELIQUIS ) VTE STARTER PACK (10MG  AND 5MG ) Take as directed on package: start with two-5mg  tablets twice daily for 7 days. On day 8, switch to one-5mg  tablet twice daily. 09/18/24   Kammerer, Megan L, DO  aspirin  EC 81 MG tablet Take 1  tablet (81 mg total) by mouth 2 (two) times daily. 09/09/24   Jesus Bernardino MATSU, MD  atorvastatin  (LIPITOR) 40 MG tablet TAKE 1 TABLET(40 MG) BY MOUTH DAILY AT 6 PM 08/18/23   Pietro Redell RAMAN, MD  buPROPion  (WELLBUTRIN  XL) 300 MG 24 hr tablet Take 300 mg by mouth daily. 05/11/16   [provider]  citalopram  (CELEXA ) 40 MG tablet Take 40 mg by mouth daily. 06/01/16   [provider]  cyclobenzaprine  (FLEXERIL ) 5 MG tablet Take 1 tablet (5 mg total) by mouth 3 (three) times daily as needed for muscle spasms. 03/23/24   Lucius Krabbe, NP  diclofenac  Sodium (VOLTAREN ) 1 % GEL Apply 4 g topically 4 (four) times daily as needed. Rub on sore joints. 08/09/24   Lucius Krabbe, NP  Dupilumab  (DUPIXENT ) 300 MG/2ML SOAJ Inject 300 mg into the skin every 14 (fourteen) days. 07/21/24   [name] Dorn NOVAK,  MD  Fluticasone -Umeclidin-Vilant (TRELEGY ELLIPTA ) 200-62.5-25 MCG/ACT AEPB Inhale 1 puff into the lungs every evening. 09/09/24   Jesus Bernardino MATSU, MD  furosemide  (LASIX ) 20 MG tablet Take 1 tablet (20 mg total) by mouth daily. 08/15/24   Zackowski, Scott, MD  gabapentin  (NEURONTIN ) 300 MG capsule Take 1 capsule (300 mg total) by mouth 3 (three) times daily. Take for numbing nerve pain. sedating 09/09/24   Jesus Bernardino MATSU, MD  glipiZIDE  (GLUCOTROL  XL) 5 MG 24 hr tablet Take 1 tablet (5 mg total) by mouth 2 (two) times daily after a meal. 09/20/24   Hudnell, Corean, NP  ipratropium-albuterol  (DUONEB) 0.5-2.5 (3) MG/3ML SOLN Take 3 mLs by nebulization 4 (four) times daily. 12/22/23   Darlean Ozell NOVAK, MD  ipratropium-albuterol  (DUONEB) 0.5-2.5 (3) MG/3ML SOLN Take 3 mLs by nebulization every 6 (six) hours as needed. 09/09/24   Jesus Bernardino MATSU, MD  metoprolol  succinate (TOPROL -XL) 25 MG 24 hr tablet Take 0.5 tablets (12.5 mg total) by mouth See admin instructions. Take 1/2 tablet (12.5mg ) by mouth once daily at noon. 08/06/24   Arlon Carliss ORN, DO  olmesartan  (BENICAR ) 40 MG tablet Take 1  tablet (40 mg total) by mouth daily. 06/29/24 06/29/25  Darlean Ozell NOVAK, MD  ondansetron  (ZOFRAN ) 4 MG tablet Take 1 tablet (4 mg total) by mouth every 6 (six) hours as needed for nausea. 08/31/24   Sheikh, Alejandro Latif, DO  oxyCODONE -acetaminophen  (PERCOCET) 5-325 MG tablet Take 1-2 tablets by mouth 2 (two) times daily as needed for severe pain (pain score 7-10). 08/27/24   Jerri Kay HERO, MD  pantoprazole  (PROTONIX ) 40 MG tablet One tablet by mouth daily 30 to 60 minutes before the first meal of the day Patient taking differently: Take 40 mg by mouth daily as needed (heartburn, acid reflux). 05/26/24   Darlean Ozell NOVAK, MD  polyethylene glycol (MIRALAX  / GLYCOLAX ) 17 g packet Take 17 g by mouth daily as needed for moderate constipation. 08/31/24   Sheikh, Omair Latif, DO  senna-docusate (SENOKOT-S) 8.6-50 MG tablet Take 1 tablet by mouth at bedtime as needed for mild constipation. 08/31/24   Sheikh, Omair Latif, DO  triamcinolone  ointment (KENALOG ) 0.5 % Apply topically 2 (two) times daily. Patient taking differently: Apply 1 Application topically 2 (two) times daily as needed (skin irritaiton). 06/24/24   Lucius Corean, NP    Physical Exam: Vitals:   09/26/24 0730 09/26/24 0900 09/26/24 0915 09/26/24 0930  BP: 123/63 115/63 137/68 119/66  Pulse: (!) 121 (!) 129 (!) 124 (!) 123  Resp: 20 11 12 14   Temp:      TempSrc:      SpO2: 98% 100% 99% 97%  Weight:      Height:        Constitutional: Elderly male who appears to be in respiratory distress Eyes: PERRL, lids and conjunctivae normal ENMT: Mucous membranes are moist. Fair dentition Neck: normal, supple,   Respiratory: decreased aeration with upper airway wheezing.  Oxygen saturations appear to be maintained.  Patient to speak in short sentences. Cardiovascular: Tachycardic.  No significant lower extremity edema. Abdomen: no tenderness, no masses palpated.  Bowel sounds positive.  Musculoskeletal: no clubbing / cyanosis. No joint deformity  upper and lower extremities. Good ROM, no contractures. Normal muscle tone.  Skin: no rashes, lesions, ulcers. No induration Neurologic: CN 2-12 grossly intact.  Able to move all extremities.  Pen rolling tremor present of the hands Psychiatric: Normal judgment and insight. Alert and oriented x 3. Normal mood.  Data Reviewed:  EKG reveals sinus tachycardia at 123 bpm with RBBB and LAFB with QTc 513.  Reviewed labs, imaging, and pertinent records as documented.  Assessment and Plan:  Acute respiratory distress COPD exacerbation Patient presents with complaints of shortness of breath and wheezing.  O2 saturations relatively maintained, but was placed on 2 L nasal cannula oxygen due to work of breathing.  Chest x-ray noted no acute abnormality.    CTA of the chest noted stable pulmonary emboli.  Patient had been given Solu-Medrol  125 mg IV, breathing treatments, and 2 g of magnesium  sulfate.  Patient has been given Levaquin  by the ED provider.  Patient has known stage IV COPD. - Admitted to a telemetry bed - Continuous pulse oximetry with oxygen maintain O2 saturation greater than 90% - Check respiratory virus panel - Scheduled DuoNeb breathing treatments and albuterol  nebs as needed - Brovana  and budesonide  nebs - Discontinue Levaquin  due to QT - PT to evaluate and treat - Check ambulatory pulse oximetry daily and document in chart  Leukocytosis White blood cell count elevated 15.2 which appears to be trending up with similar to most recent prior.  But of note patient does have a history of bacteremia back in October. - Check single blood culture  History of pulmonary embolism On anticoagulation Patient had been diagnosed with pulmonary emboli on 11/29 but had not started medication as of 12/1 when seen by primary care provider.  Since that time patient reports that he started Eliquis  and has been taking it as prescribed. - Continue Eliquis   Prolonged QT interval QTc 513. - Avoid  possible QT prolonging medications  Steroid-induced diabetes mellitus type 2, without long-term use of insulin  Last hemoglobin A1c noted to be 7.6 when checked on 08/20/2024. - Hypoglycemia protocols - Hold glipizide  - CBGs before every meal with sensitive SSI. - Adjust regimen as deemed medically appropriate  Normocytic anemia Hemoglobin 12.2 which appears around patient's baseline.  - Continue to monitor  Essential hypertension Blood pressures currently maintained. - Continue metoprolol  and pharmacy substitution irbesartan   Diastolic dysfunction Chronic.  Patient appears to be fairly euvolemic on physical exam.  Last echocardiogram noted EF to be 60 to 65% with grade 1 diastolic dysfunction when last checked 06/09/2024.  Depression - Continue Wellbutrin  and Celexa   Coronary artery disease Hyperlipidemia High-sensitivity troponins were negative x 2. - Continue atorvastatin    Deconditioning/debility Patient only able to walk a couple feet without becoming extremely winded needing to rest - PT to evaluate  DVT prophylaxis: Eliquis  Advance Care Planning:   Code Status: Full Code    Consults: None  Family Communication: Patient's wife updated over the phone  Severity of Illness: The appropriate patient status for this patient is OBSERVATION. Observation status is judged to be reasonable and necessary in order to provide the required intensity of service to ensure the patient's safety. The patient's presenting symptoms, physical exam findings, and initial radiographic and laboratory data in the context of their medical condition is felt to place them at decreased risk for further clinical deterioration. Furthermore, it is anticipated that the patient will be medically stable for discharge from the hospital within 2 midnights of admission.   Author: Maximino DELENA Sharps, MD 09/26/2024 9:49 AM  For on call review [email or web address].

## 2024-09-26 NOTE — ED Notes (Signed)
 RT called for assistance with continuous nebulizer.

## 2024-09-26 NOTE — ED Triage Notes (Signed)
 Pt states he uses walker at baseline

## 2024-09-26 NOTE — Plan of Care (Signed)

## 2024-09-26 NOTE — ED Notes (Signed)
 Pt to CT

## 2024-09-26 NOTE — ED Triage Notes (Signed)
 Pt comes from home, stage 4 COPD, pt started having trouble breathing yesterday and tried to fix it but unable to. Pt not on O2 at baseline. Pt states this is the worst episode he has had.   EMS meds 2 duoneb 125 solumedrol 2g mag

## 2024-09-26 NOTE — ED Notes (Signed)
 Ambulatory w/ walker to restroom

## 2024-09-26 NOTE — ED Notes (Signed)
 Pt walked approx 60ft with a walker. At the 10ft mark he was leaned over the walker and unable to walk any further with complaints of dizziness and shob. Sats steady at 95-100% the entirety.

## 2024-09-26 NOTE — ED Provider Notes (Signed)
 [place] [facility] AT [facility] Provider Note   CSN: [id number] Arrival date & time: 09/26/24  9382     Patient presents with: Respiratory Distress   Gary Wright is a 78 y.o. male who presented to the ED with worsening shortness of breath which he describes as substantially worse than previous episodes.  Review of previous medical records he was treated in this ED on 29 November for similar type symptoms, extensive workup eventually led to diagnosis of COPD exacerbation.  PE study obtained at that time did not show small PEs and was started on Eliquis  at that time.  On evaluation of records, on 1 December he was noted he had not yet started taking the Eliquis  as prescribed.  Currently using Trelegy, albuterol  every 6 hours, and Proventil  as needed.  He was brought in by EMS, during his transport he was provided with 125 mg of Solu-Medrol , 2 g of magnesium  sulfate, and 2 DuoNebs.  He states that after this his breathing has somewhat improved however he still feels that he is short of breath.   HPI     Prior to Admission medications   Medication Sig Start Date End Date Taking? Authorizing Provider  acetaminophen  (TYLENOL ) 325 MG tablet Take 1-2 tablets (325-650 mg total) by mouth every 6 (six) hours as needed for mild pain (pain score 1-3) or fever (or temp > 100.5). 08/31/24   Sherrill Cable Latif, DO  albuterol  (PROVENTIL ) (2.5 MG/3ML) 0.083% nebulizer solution Take 3 mLs (2.5 mg total) by nebulization every 4 (four) hours as needed for wheezing or shortness of breath. 06/09/24   [name] Dorn NOVAK, MD  Albuterol -Budesonide  (AIRSUPRA ) 90-80 MCG/ACT AERO Inhale 2 puffs into the lungs 4 (four) times daily as needed. 09/09/24   Jesus Bernardino MATSU, MD  APIXABAN  (ELIQUIS ) VTE STARTER PACK (10MG  AND 5MG ) Take as directed on package: start with two-5mg  tablets twice daily for 7 days. On day 8, switch to one-5mg  tablet twice daily. 09/18/24   Kammerer, Megan L, DO   aspirin  EC 81 MG tablet Take 1 tablet (81 mg total) by mouth 2 (two) times daily. 09/09/24   Jesus Bernardino MATSU, MD  atorvastatin  (LIPITOR) 40 MG tablet TAKE 1 TABLET(40 MG) BY MOUTH DAILY AT 6 PM 08/18/23   Pietro Redell RAMAN, MD  buPROPion  (WELLBUTRIN  XL) 300 MG 24 hr tablet Take 300 mg by mouth daily. 05/11/16   [provider]  citalopram  (CELEXA ) 40 MG tablet Take 40 mg by mouth daily. 06/01/16   [provider]  cyclobenzaprine  (FLEXERIL ) 5 MG tablet Take 1 tablet (5 mg total) by mouth 3 (three) times daily as needed for muscle spasms. 03/23/24   Lucius Krabbe, NP  diclofenac  Sodium (VOLTAREN ) 1 % GEL Apply 4 g topically 4 (four) times daily as needed. Rub on sore joints. 08/09/24   Lucius Krabbe, NP  Dupilumab  (DUPIXENT ) 300 MG/2ML SOAJ Inject 300 mg into the skin every 14 (fourteen) days. 07/21/24   [name] Dorn NOVAK, MD  Fluticasone -Umeclidin-Vilant (TRELEGY ELLIPTA ) 200-62.5-25 MCG/ACT AEPB Inhale 1 puff into the lungs every evening. 09/09/24   Jesus Bernardino MATSU, MD  furosemide  (LASIX ) 20 MG tablet Take 1 tablet (20 mg total) by mouth daily. 08/15/24   Zackowski, Scott, MD  gabapentin  (NEURONTIN ) 300 MG capsule Take 1 capsule (300 mg total) by mouth 3 (three) times daily. Take for numbing nerve pain. sedating 09/09/24   Jesus Bernardino MATSU, MD  glipiZIDE  (GLUCOTROL  XL) 5 MG 24 hr tablet Take 1 tablet (  5 mg total) by mouth 2 (two) times daily after a meal. 09/20/24   Hudnell, Corean, NP  ipratropium-albuterol  (DUONEB) 0.5-2.5 (3) MG/3ML SOLN Take 3 mLs by nebulization 4 (four) times daily. 12/22/23   Darlean Ozell NOVAK, MD  ipratropium-albuterol  (DUONEB) 0.5-2.5 (3) MG/3ML SOLN Take 3 mLs by nebulization every 6 (six) hours as needed. 09/09/24   Jesus Bernardino MATSU, MD  metoprolol  succinate (TOPROL -XL) 25 MG 24 hr tablet Take 0.5 tablets (12.5 mg total) by mouth See admin instructions. Take 1/2 tablet (12.5mg ) by mouth once daily at noon. 08/06/24   Arlon Carliss ORN, DO   olmesartan  (BENICAR ) 40 MG tablet Take 1 tablet (40 mg total) by mouth daily. 06/29/24 06/29/25  Darlean Ozell NOVAK, MD  ondansetron  (ZOFRAN ) 4 MG tablet Take 1 tablet (4 mg total) by mouth every 6 (six) hours as needed for nausea. 08/31/24   Sherrill Cable Latif, DO  oxyCODONE -acetaminophen  (PERCOCET) 5-325 MG tablet Take 1-2 tablets by mouth 2 (two) times daily as needed for severe pain (pain score 7-10). 08/27/24   Jerri Kay HERO, MD  pantoprazole  (PROTONIX ) 40 MG tablet One tablet by mouth daily 30 to 60 minutes before the first meal of the day Patient taking differently: Take 40 mg by mouth daily as needed (heartburn, acid reflux). 05/26/24   Darlean Ozell NOVAK, MD  polyethylene glycol (MIRALAX  / GLYCOLAX ) 17 g packet Take 17 g by mouth daily as needed for moderate constipation. 08/31/24   Sheikh, Omair Latif, DO  senna-docusate (SENOKOT-S) 8.6-50 MG tablet Take 1 tablet by mouth at bedtime as needed for mild constipation. 08/31/24   Sheikh, Omair Latif, DO  triamcinolone  ointment (KENALOG ) 0.5 % Apply topically 2 (two) times daily. Patient taking differently: Apply 1 Application topically 2 (two) times daily as needed (skin irritaiton). 06/24/24   Lucius Corean, NP    Allergies: Patient has no known allergies.    Review of Systems  Respiratory:  Positive for chest tightness and shortness of breath.   All other systems reviewed and are negative.   Updated Vital Signs BP 119/66   Pulse (!) 123   Temp 99.1 F (37.3 C) (Oral)   Resp 14   Ht 5' 8 (1.727 m)   Wt 78.5 kg   SpO2 97%   BMI 26.30 kg/m   Physical Exam Vitals and nursing note reviewed.  Constitutional:      General: He is not in acute distress.    Appearance: Normal appearance.  HENT:     Head: Normocephalic and atraumatic.     Mouth/Throat:     Mouth: Mucous membranes are moist.     Pharynx: Oropharynx is clear.  Eyes:     Extraocular Movements: Extraocular movements intact.     Conjunctiva/sclera: Conjunctivae normal.      Pupils: Pupils are equal, round, and reactive to light.  Cardiovascular:     Rate and Rhythm: Normal rate and regular rhythm.     Pulses: Normal pulses.     Heart sounds: Normal heart sounds. No murmur heard.    No friction rub. No gallop.  Pulmonary:     Effort: Pulmonary effort is normal. Prolonged expiration present. No accessory muscle usage.     Breath sounds: Examination of the right-upper field reveals wheezing. Examination of the left-upper field reveals wheezing. Examination of the right-middle field reveals wheezing. Examination of the left-middle field reveals wheezing. Examination of the right-lower field reveals decreased breath sounds and wheezing. Examination of the left-lower field reveals decreased breath sounds and  wheezing. Decreased breath sounds and wheezing present.  Abdominal:     General: Abdomen is flat. Bowel sounds are normal.     Palpations: Abdomen is soft.  Musculoskeletal:        General: Normal range of motion.     Cervical back: Normal range of motion and neck supple.     Right lower leg: No edema.     Left lower leg: No edema.  Skin:    General: Skin is warm and dry.     Capillary Refill: Capillary refill takes less than 2 seconds.  Neurological:     General: No focal deficit present.     Mental Status: He is alert. Mental status is at baseline.  Psychiatric:        Mood and Affect: Mood normal.     (all labs ordered are listed, but only abnormal results are displayed) Labs Reviewed  BASIC METABOLIC PANEL WITH GFR - Abnormal; Notable for the following components:      Result Value   CO2 21 (*)    Glucose, Bld 143 (*)    Calcium  8.7 (*)    GFR, Estimated 60 (*)    Anion gap 16 (*)    All other components within normal limits  CBC - Abnormal; Notable for the following components:   WBC 15.2 (*)    RBC 3.85 (*)    Hemoglobin 12.2 (*)    HCT 37.6 (*)    All other components within normal limits  I-STAT CHEM 8, ED - Abnormal; Notable for the  following components:   Glucose, Bld 142 (*)    Calcium , Ion 1.08 (*)    TCO2 20 (*)    Hemoglobin 12.6 (*)    HCT 37.0 (*)    All other components within normal limits  EXPECTORATED SPUTUM ASSESSMENT W GRAM STAIN, RFLX TO RESP C  BRAIN NATRIURETIC PEPTIDE  TROPONIN I (HIGH SENSITIVITY)  TROPONIN I (HIGH SENSITIVITY)    EKG: None  Radiology: DG Chest Portable 1 View Result Date: 09/26/2024 CLINICAL DATA:  Chest pain EXAM: PORTABLE CHEST 1 VIEW COMPARISON:  09/18/2024 FINDINGS: The lungs are clear without focal pneumonia, edema, pneumothorax or pleural effusion. The cardiopericardial silhouette is within normal limits for size. Telemetry leads overlie the chest. IMPRESSION: No active disease. Electronically Signed   By: Camellia Candle M.D.   On: 09/26/2024 07:31     Procedures   Medications Ordered in the ED  albuterol  (PROVENTIL ) (2.5 MG/3ML) 0.083% nebulizer solution (has no administration in time range)  iohexol  (OMNIPAQUE ) 350 MG/ML injection 75 mL (has no administration in time range)  sodium chloride  0.9 % bolus 1,000 mL (0 mLs Intravenous Stopped 09/26/24 0809)  ipratropium-albuterol  (DUONEB) 0.5-2.5 (3) MG/3ML nebulizer solution 3 mL (3 mLs Nebulization Given 09/26/24 0831)  sodium chloride  0.9 % bolus 500 mL (500 mLs Intravenous New Bag/Given 09/26/24 0926)  levofloxacin  (LEVAQUIN ) tablet 750 mg (750 mg Oral Given 09/26/24 0948)                                    Medical Decision Making Amount and/or Complexity of Data Reviewed Labs: ordered. Radiology: ordered. ECG/medicine tests: ordered.  Risk Prescription drug management. Decision regarding hospitalization.   Medical Decision Making:   Gary Wright is a 78 y.o. male who presented to the ED today with increased dyspnea detailed above.    External chart has been reviewed including previous admission  records, labs, imaging.. Patient's presentation is complicated by their history of COPD, CHF, previously  diagnosed PE.  Patient placed on continuous vitals and telemetry monitoring while in ED which was reviewed periodically.  Complete initial physical exam performed, notably the patient  was alert and oriented in no apparent distress however apparently dyspneic.  No accessory muscle use or intercostal retractions noted.  Decreased air entry into the bases noted bilaterally as well as wheezes noted throughout fields.    Reviewed and confirmed nursing documentation for past medical history, family history, social history.    Initial Assessment:   With the patient's presentation of increased dyspnea, most likely diagnosis is COPD exacerbation.  Further consider differential diagnosis of CHF, worsening PE,  Initial Plan:  Secondary to history of CHF, evaluate BNP. Screening labs including CBC and Metabolic panel to evaluate for infectious or metabolic etiology of disease.  CXR to evaluate for structural/infectious intrathoracic pathology.  EKG and serial troponin to evaluate for cardiac pathology. Objective evaluation as below reviewed   Initial Study Results:   Laboratory  All laboratory results reviewed without evidence of clinically relevant pathology.   Exceptions include: Leukocytosis of 15.2 which is notably increased from previous visit.  EKG EKG was reviewed independently. Rate, rhythm, axis, intervals all examined and without medically relevant abnormality. ST segments without concerns for elevations.    Radiology:  All images reviewed independently. Agree with radiology report at this time.   DG Chest Portable 1 View Result Date: 09/26/2024 CLINICAL DATA:  Chest pain EXAM: PORTABLE CHEST 1 VIEW COMPARISON:  09/18/2024 FINDINGS: The lungs are clear without focal pneumonia, edema, pneumothorax or pleural effusion. The cardiopericardial silhouette is within normal limits for size. Telemetry leads overlie the chest. IMPRESSION: No active disease. Electronically Signed   By: Camellia Candle  M.D.   On: 09/26/2024 07:31   CT Angio Chest PE W and/or Wo Contrast Result Date: 09/18/2024 EXAM: CTA CHEST 09/18/2024 06:54:00 AM TECHNIQUE: CTA of the chest was performed without and with the administration of 75 mL of intravenous iohexol  (OMNIPAQUE ) 350 MG/ML injection. Multiplanar reformatted images are provided for review. MIP images are provided for review. Automated exposure control, iterative reconstruction, and/or weight based adjustment of the mA/kV was utilized to reduce the radiation dose to as low as reasonably achievable. COMPARISON: Portable chest from today, chest radiograph 08/18/2024, CTA chest 08/02/2024, CTA chest 06/12/2024. CLINICAL HISTORY: Pulmonary embolism (PE) suspected, low to intermediate prob, positive D-dimer. FINDINGS: PULMONARY ARTERIES: The pulmonary arteries are suboptimally opacified beyond the segmental divisions. The subsegmental arteries are poorly evaluated. There is a nonocclusive segmental embolus in the medial segment of the right middle lobe on series 7, axial image 78 and 79. There is at least 1 subsegmental embolus in the right lower lobe on images 95 through 97. Main pulmonary artery is normal in caliber. MEDIASTINUM: The cardiac size is normal. Small pericardial effusion again collects to the right. There are no findings of acute right heart strain or arterial dilatation. There are patchy 3-vessel coronary artery calcifications. There is atherosclerosis of the aorta and great vessels with mild aortic tortuosity without aneurysm or dissection. The pulmonary veins are nondilated. There is a small hiatal hernia and mild generalized thickening in the thoracic esophagus consistent with esophagitis. LYMPH NODES: No mediastinal, hilar or axillary lymphadenopathy. LUNGS AND PLEURA: There is left greater than right moderately advanced centrilobular emphysema, due to prior left upper lobectomy. Paraseptal emphysema noted in the right apex as well. There is increased linear  atelectasis  in the medial right upper lobe. No focal consolidation or pulmonary edema. No evidence of pleural effusion or pneumothorax. No suspicious nodules are seen. UPPER ABDOMEN: There is a 3 mm calyceal stone in the superior pole of the left kidney. No acute upper abdominal findings allowing for respiratory motion artifact. SOFT TISSUES AND BONES: There is osteopenia and degenerative changes of the spine. Advanced bilateral shoulder arthrosis. 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 IMPRESSION: 1. Nonocclusive segmental embolus in the medial segment of the right middle lobe and at least one subsegmental embolus in the right lower lobe, with suboptimal evaluation of subsegmental arteries; no acute right heart strain or arterial dilatation. No other appreciable emboli. 2. Left greater than right moderately advanced centrilobular emphysema with right apical paraseptal emphysema; increased linear atelectasis in the medial right upper lobe; no consolidation, effusion, or suspicious nodules. 3. Aortic and coronary atherosclerosis. Electronically signed by: Francis Quam MD 09/18/2024 07:34 AM EST RP Workstation: [id number]   DG Chest Port 1 View Result Date: 09/18/2024 EXAM: 1 VIEW(S) XRAY OF THE CHEST 09/18/2024 05:22:00 AM COMPARISON: Portable chest 08/18/2024. CLINICAL HISTORY: sob FINDINGS: LUNGS AND PLEURA: Lungs are emphysematous but clear. No focal pulmonary opacity. No pleural effusion. No pneumothorax. HEART AND MEDIASTINUM: The cardiomediastinal silhouette is normal, with aortic atherosclerosis. BONES AND SOFT TISSUES: Mild upper thoracic levoscoliosis and osteopenia. No new osseous abnormality. Advanced DJD of both shoulders. IMPRESSION: 1. No acute cardiopulmonary process. 2. Emphysema. Stable COPD chest. 3. Aortic atherosclerosis. Electronically signed by: Francis Quam MD 09/18/2024 05:31 AM EST RP Workstation: [id number]   DG Pelvis Portable Result Date: 08/27/2024 CLINICAL  DATA:  Postop EXAM: PORTABLE PELVIS 1-2 VIEWS COMPARISON:  08/18/2024 FINDINGS: Status post left hip replacement with intact hardware and normal alignment. Gas in the soft tissues consistent with recent surgery. Pubic symphysis and rami are intact. Advanced right hip degenerative change with bone on bone contact and subarticular sclerosis. IMPRESSION: Status post left hip replacement with expected postsurgical change. Electronically Signed   By: Luke Bun M.D.   On: 08/27/2024 23:09   DG HIP UNILAT WITH PELVIS 2-3 VIEWS LEFT Result Date: 08/27/2024 CLINICAL DATA:  Hip surgery EXAM: DG HIP (WITH OR WITHOUT PELVIS) 2-3V*L* COMPARISON:  08/18/2024 FINDINGS: Multiple low resolution intraoperative spot views of the left hip. Total fluoroscopy time was 19.1 seconds, fluoroscopic dose of 2.45 mGy. Images were obtained during operative left hip replacement IMPRESSION: Intraoperative fluoroscopic assistance provided during left hip replacement. Electronically Signed   By: Luke Bun M.D.   On: 08/27/2024 23:09   DG C-Arm 1-60 Min-No Report Result Date: 08/27/2024 Fluoroscopy was utilized by the requesting physician.  No radiographic interpretation.      Consults: Case discussed with Dr. Claudene with the hospitalist team.   Reassessment and Plan:   On reassessment of this patient he had continued wheezing despite continued bronchodilator therapy, attempted to ambulate this patient and he was unable to tolerate ambulation, was not able to demonstrate hypoxia but was unable to ambulate without extensive worsening of his dyspnea and becoming dizzy.  As such, find this patient has acute COPD exacerbation that is requiring him to need IV antibiotics as well as continue oxygen therapy.    Discussed case with hospitalist team on except for admission for continued evaluation workup for COPD exacerbation, antibiotic management for COPD exacerbation.  Pending study for evaluation of continued PE.  Provided with dose  of oral Levaquin  for management of COPD exacerbation.  Final diagnoses:  COPD exacerbation [facility])    ED Discharge Orders     None          Myriam Dorn BROCKS, [place] 09/26/24 1040

## 2024-09-27 DIAGNOSIS — T45515A Adverse effect of anticoagulants, initial encounter: Secondary | ICD-10-CM | POA: Diagnosis present

## 2024-09-27 DIAGNOSIS — I10 Essential (primary) hypertension: Secondary | ICD-10-CM | POA: Diagnosis not present

## 2024-09-27 DIAGNOSIS — D6489 Other specified anemias: Secondary | ICD-10-CM | POA: Diagnosis not present

## 2024-09-27 DIAGNOSIS — I25810 Atherosclerosis of coronary artery bypass graft(s) without angina pectoris: Secondary | ICD-10-CM | POA: Diagnosis present

## 2024-09-27 DIAGNOSIS — E785 Hyperlipidemia, unspecified: Secondary | ICD-10-CM | POA: Diagnosis present

## 2024-09-27 DIAGNOSIS — D62 Acute posthemorrhagic anemia: Secondary | ICD-10-CM | POA: Diagnosis not present

## 2024-09-27 DIAGNOSIS — I130 Hypertensive heart and chronic kidney disease with heart failure and stage 1 through stage 4 chronic kidney disease, or unspecified chronic kidney disease: Secondary | ICD-10-CM | POA: Diagnosis present

## 2024-09-27 DIAGNOSIS — I2694 Multiple subsegmental pulmonary emboli without acute cor pulmonale: Secondary | ICD-10-CM | POA: Diagnosis present

## 2024-09-27 DIAGNOSIS — Z7901 Long term (current) use of anticoagulants: Secondary | ICD-10-CM | POA: Diagnosis not present

## 2024-09-27 DIAGNOSIS — N183 Chronic kidney disease, stage 3 unspecified: Secondary | ICD-10-CM | POA: Diagnosis present

## 2024-09-27 DIAGNOSIS — I5022 Chronic systolic (congestive) heart failure: Secondary | ICD-10-CM | POA: Diagnosis present

## 2024-09-27 DIAGNOSIS — I2510 Atherosclerotic heart disease of native coronary artery without angina pectoris: Secondary | ICD-10-CM | POA: Diagnosis present

## 2024-09-27 DIAGNOSIS — Z7984 Long term (current) use of oral hypoglycemic drugs: Secondary | ICD-10-CM | POA: Diagnosis not present

## 2024-09-27 DIAGNOSIS — Z8249 Family history of ischemic heart disease and other diseases of the circulatory system: Secondary | ICD-10-CM | POA: Diagnosis not present

## 2024-09-27 DIAGNOSIS — R630 Anorexia: Secondary | ICD-10-CM | POA: Diagnosis not present

## 2024-09-27 DIAGNOSIS — J441 Chronic obstructive pulmonary disease with (acute) exacerbation: Secondary | ICD-10-CM | POA: Diagnosis present

## 2024-09-27 DIAGNOSIS — K2101 Gastro-esophageal reflux disease with esophagitis, with bleeding: Secondary | ICD-10-CM | POA: Diagnosis present

## 2024-09-27 DIAGNOSIS — D6832 Hemorrhagic disorder due to extrinsic circulating anticoagulants: Secondary | ICD-10-CM | POA: Diagnosis present

## 2024-09-27 DIAGNOSIS — D127 Benign neoplasm of rectosigmoid junction: Secondary | ICD-10-CM | POA: Diagnosis not present

## 2024-09-27 DIAGNOSIS — D500 Iron deficiency anemia secondary to blood loss (chronic): Secondary | ICD-10-CM | POA: Diagnosis present

## 2024-09-27 DIAGNOSIS — K648 Other hemorrhoids: Secondary | ICD-10-CM | POA: Diagnosis not present

## 2024-09-27 DIAGNOSIS — Z96642 Presence of left artificial hip joint: Secondary | ICD-10-CM | POA: Diagnosis present

## 2024-09-27 DIAGNOSIS — R195 Other fecal abnormalities: Secondary | ICD-10-CM | POA: Diagnosis not present

## 2024-09-27 DIAGNOSIS — T380X5A Adverse effect of glucocorticoids and synthetic analogues, initial encounter: Secondary | ICD-10-CM | POA: Diagnosis present

## 2024-09-27 DIAGNOSIS — K5521 Angiodysplasia of colon with hemorrhage: Secondary | ICD-10-CM | POA: Diagnosis present

## 2024-09-27 DIAGNOSIS — Z902 Acquired absence of lung [part of]: Secondary | ICD-10-CM | POA: Diagnosis not present

## 2024-09-27 DIAGNOSIS — E0922 Drug or chemical induced diabetes mellitus with diabetic chronic kidney disease: Secondary | ICD-10-CM | POA: Diagnosis present

## 2024-09-27 DIAGNOSIS — K209 Esophagitis, unspecified without bleeding: Secondary | ICD-10-CM | POA: Diagnosis not present

## 2024-09-27 DIAGNOSIS — K31811 Angiodysplasia of stomach and duodenum with bleeding: Secondary | ICD-10-CM | POA: Diagnosis present

## 2024-09-27 DIAGNOSIS — I2699 Other pulmonary embolism without acute cor pulmonale: Secondary | ICD-10-CM | POA: Diagnosis not present

## 2024-09-27 DIAGNOSIS — D120 Benign neoplasm of cecum: Secondary | ICD-10-CM | POA: Diagnosis not present

## 2024-09-27 DIAGNOSIS — Z87891 Personal history of nicotine dependence: Secondary | ICD-10-CM | POA: Diagnosis not present

## 2024-09-27 DIAGNOSIS — F32A Depression, unspecified: Secondary | ICD-10-CM | POA: Diagnosis present

## 2024-09-27 DIAGNOSIS — K31819 Angiodysplasia of stomach and duodenum without bleeding: Secondary | ICD-10-CM | POA: Diagnosis not present

## 2024-09-27 DIAGNOSIS — R634 Abnormal weight loss: Secondary | ICD-10-CM | POA: Diagnosis present

## 2024-09-27 DIAGNOSIS — K449 Diaphragmatic hernia without obstruction or gangrene: Secondary | ICD-10-CM | POA: Diagnosis not present

## 2024-09-27 LAB — BASIC METABOLIC PANEL WITH GFR
Anion gap: 14 (ref 5–15)
BUN: 14 mg/dL (ref 8–23)
CO2: 22 mmol/L (ref 22–32)
Calcium: 8.5 mg/dL — ABNORMAL LOW (ref 8.9–10.3)
Chloride: 104 mmol/L (ref 98–111)
Creatinine, Ser: 1.06 mg/dL (ref 0.61–1.24)
GFR, Estimated: >60 mL/min (ref >60)
Glucose, Bld: 160 mg/dL — ABNORMAL HIGH (ref 70–99)
Potassium: 3.4 mmol/L — ABNORMAL LOW (ref 3.5–5.1)
Sodium: 140 mmol/L (ref 135–145)

## 2024-09-27 LAB — CBC
HCT: 28.8 % — ABNORMAL LOW (ref 39.0–52.0)
Hemoglobin: 9.8 g/dL — ABNORMAL LOW (ref 13.0–17.0)
MCH: 31.8 pg (ref 26.0–34.0)
MCHC: 34 g/dL (ref 30.0–36.0)
MCV: 93.5 fL (ref 80.0–100.0)
Platelets: 339 K/uL (ref 150–400)
RBC: 3.08 MIL/uL — ABNORMAL LOW (ref 4.22–5.81)
RDW: 13.6 % (ref 11.5–15.5)
WBC: 15.4 K/uL — ABNORMAL HIGH (ref 4.0–10.5)
nRBC: 0 % (ref 0.0–0.2)

## 2024-09-27 LAB — GLUCOSE, CAPILLARY
Glucose-Capillary: 161 mg/dL — ABNORMAL HIGH (ref 70–99)
Glucose-Capillary: 179 mg/dL — ABNORMAL HIGH (ref 70–99)
Glucose-Capillary: 179 mg/dL — ABNORMAL HIGH (ref 70–99)
Glucose-Capillary: 172 mg/dL — ABNORMAL HIGH (ref 70–99)

## 2024-09-27 MED ORDER — POTASSIUM CHLORIDE CRYS ER 20 MEQ PO TBCR
20.0000 meq | EXTENDED_RELEASE_TABLET | Freq: Two times a day (BID) | ORAL | Status: AC
  Administered 2024-09-27 – 2024-09-28 (×4): 20 meq via ORAL
  Filled 2024-09-27 (×4): qty 1

## 2024-09-27 MED ORDER — LORAZEPAM 2 MG/ML IJ SOLN
0.5000 mg | Freq: Four times a day (QID) | INTRAMUSCULAR | Status: DC | PRN
  Administered 2024-09-27 (×2): 0.5 mg via INTRAVENOUS
  Filled 2024-09-27 (×2): qty 1

## 2024-09-27 MED ORDER — SODIUM CHLORIDE 0.9 % IV BOLUS
500.0000 mL | Freq: Once | INTRAVENOUS | Status: AC
  Administered 2024-09-27: 500 mL via INTRAVENOUS

## 2024-09-27 MED ORDER — LORAZEPAM 2 MG/ML IJ SOLN: 0.5000 mg | Freq: Once | INTRAMUSCULAR | Status: DC

## 2024-09-27 MED ORDER — HYDROCODONE-ACETAMINOPHEN 5-325 MG PO TABS
1.0000 | ORAL_TABLET | ORAL | Status: DC | PRN
  Administered 2024-09-27 – 2024-09-29 (×6): 1 via ORAL
  Filled 2024-09-27 (×7): qty 1

## 2024-09-27 MED ORDER — IPRATROPIUM-ALBUTEROL 0.5-2.5 (3) MG/3ML IN SOLN
3.0000 mL | Freq: Three times a day (TID) | RESPIRATORY_TRACT | Status: DC
  Administered 2024-09-27 (×2): 3 mL via RESPIRATORY_TRACT
  Filled 2024-09-27 (×2): qty 3

## 2024-09-27 MED ORDER — SCOPOLAMINE 1 MG/3DAYS TD PT72
1.0000 | MEDICATED_PATCH | TRANSDERMAL | Status: DC
  Administered 2024-09-27: 1 mg via TRANSDERMAL
  Filled 2024-09-27: qty 1

## 2024-09-27 NOTE — Discharge Instructions (Signed)
 Information on my medicine - ELIQUIS  (apixaban )  This medication education was reviewed with me or my healthcare representative as part of my discharge preparation.  The pharmacist that spoke with me during my hospital stay was:  Atilano Olam Jansky, [facility]  Why was Eliquis  prescribed for you? Eliquis  was prescribed to treat blood clots that may have been found in the veins of your legs (deep vein thrombosis) or in your lungs (pulmonary embolism) and to reduce the risk of them occurring again.  What do You need to know about Eliquis  ? The starting dose is 10 mg (two 5 mg tablets) taken TWICE daily for the FIRST SEVEN (7) DAYS, then on  12/9  the dose is reduced to ONE 5 mg tablet taken TWICE daily.  Eliquis  may be taken with or without food.   Try to take the dose about the same time in the morning and in the evening. If you have difficulty swallowing the tablet whole please discuss with your pharmacist how to take the medication safely.  Take Eliquis  exactly as prescribed and DO NOT stop taking Eliquis  without talking to the doctor who prescribed the medication.  Stopping may increase your risk of developing a new blood clot.  Refill your prescription before you run out.  After discharge, you should have regular check-up appointments with your healthcare provider that is prescribing your Eliquis .    What do you do if you miss a dose? If a dose of ELIQUIS  is not taken at the scheduled time, take it as soon as possible on the same day and twice-daily administration should be resumed. The dose should not be doubled to make up for a missed dose.  Important Safety Information A possible side effect of Eliquis  is bleeding. You should call your healthcare provider right away if you experience any of the following: Bleeding from an injury or your nose that does not stop. Unusual colored urine (red or dark brown) or unusual colored stools (red or black). Unusual bruising for unknown reasons. A  serious fall or if you hit your head (even if there is no bleeding).  Some medicines may interact with Eliquis  and might increase your risk of bleeding or clotting while on Eliquis . To help avoid this, consult your healthcare provider or pharmacist prior to using any new prescription or non-prescription medications, including herbals, vitamins, non-steroidal anti-inflammatory drugs (NSAIDs) and supplements.  This website has more information on Eliquis  (apixaban ): http://[email or web address] .com/eliquis /home.

## 2024-09-27 NOTE — Evaluation (Signed)
 Physical Therapy Evaluation Patient Details Name: Gary Wright MRN: [id number] DOB: 08-Apr-1946 Today's Date: 09/27/2024  History of Present Illness  Pt is 78 yo presenting to [facility] On 12/7 due to progressive shortness of breathe. PMH: recent L THA 11/7, stave IV COPD, CAD s/p CABG, Htn, HLD, DM, hx PE, chronic avascular necrosis of L hip.  Clinical Impression  Pt is currently presenting at Mod I for bed mobility with increased time, CGA for sit to stand and gait with verbal cues for purse lipped breathing to maintain O2 sats above 88% on room air with 200 ft of gait utilizing RW. Pt requires RW for balance with light UE support. Spouse is able to assist at home. Pt currently reports he would rather not have physical therapy at home and education on contacting PCP if he changes his mind in order to work on endurance/balance. Due to pt current functional status, home set up and available assistance at home no recommended skilled physical therapy services at this time on discharge from acute care hospital setting. Will continue to follow in acute setting in order to ensure that pt returns home with decreased risk for falls, injury, re-hospitalization and improved activity tolerance.          If plan is discharge home, recommend the following: Assistance with cooking/housework;Assist for transportation;Help with stairs or ramp for entrance     Equipment Recommendations None recommended by PT     Functional Status Assessment Patient has had a recent decline in their functional status and demonstrates the ability to make significant improvements in function in a reasonable and predictable amount of time.     Precautions / Restrictions Precautions Precautions: Fall Recall of Precautions/Restrictions: Intact Precaution/Restrictions Comments: watch O2 Restrictions Weight Bearing Restrictions Per Provider Order: No      Mobility  Bed Mobility Overal bed mobility: Modified Independent     Transfers Overall transfer level: Needs assistance Equipment used: Rolling walker (2 wheels) Transfers: Sit to/from Stand Sit to Stand: Contact guard assist           General transfer comment: CGA for safety, O2 sats 98% HR 122 bpm    Ambulation/Gait Ambulation/Gait assistance: Contact guard assist Gait Distance (Feet): 200 Feet Assistive device: Rolling walker (2 wheels) Gait Pattern/deviations: Step-through pattern, Decreased stride length, Trunk flexed Gait velocity: decreased Gait velocity interpretation: 1.31 - 2.62 ft/sec, indicative of limited community ambulator   General Gait Details: HR up to 147 bpm with gait, Verbal cues throughout for purse lipped breathing, dyspnea after gait, O2 sats 93% with HR compensation  Stairs Stairs:  (demonstrates strength with sit to stand and gait to perform stairs per home set up with minimal help)            Balance Overall balance assessment: Mild deficits observed, not formally tested       Pertinent Vitals/Pain Pain Assessment Pain Assessment: 0-10 Pain Score: 9  Pain Location: L hip Pain Descriptors / Indicators: Aching, Grimacing, Discomfort Pain Intervention(s): Limited activity within patient's tolerance, Monitored during session, Patient requesting pain meds-RN notified    Home Living Family/patient expects to be discharged to:: Private residence Living Arrangements: Spouse/significant other Available Help at Discharge: Family;Available 24 hours/day Type of Home: House Home Access: Stairs to enter Entrance Stairs-Rails: Right Entrance Stairs-Number of Steps: 4   Home Layout: One level Home Equipment: Rollator (4 wheels);Cane - single point;Shower seat;Rolling Environmental Consultant (2 wheels)      Prior Function Prior Level of Function : Independent/Modified Independent  Mobility Comments: RW for gait since THR ADLs Comments: Pt reports mod I with ADL's. Spouse helps with transportation      Extremity/Trunk Assessment   Upper Extremity Assessment Upper Extremity Assessment: Generalized weakness    Lower Extremity Assessment Lower Extremity Assessment: Generalized weakness    Cervical / Trunk Assessment Cervical / Trunk Assessment: Kyphotic  Communication   Communication Communication: No apparent difficulties    Cognition Arousal: Alert Behavior During Therapy: WFL for tasks assessed/performed   PT - Cognitive impairments: No apparent impairments       Following commands: Intact       Cueing Cueing Techniques: Verbal cues     General Comments General comments (skin integrity, edema, etc.): SPO2 remained 88% or higher on room air with gait with cues for purse lipped breathing. HR up to 147 bpm with gait startd at 122 bpm        Assessment/Plan    PT Assessment Patient needs continued PT services  PT Problem List Decreased mobility;Decreased activity tolerance;Decreased balance;Cardiopulmonary status limiting activity       PT Treatment Interventions DME instruction;Therapeutic exercise;Gait training;Balance training;Stair training;Functional mobility training;Therapeutic activities;Patient/family education    PT Goals (Current goals can be found in the Care Plan section)  Acute Rehab PT Goals Patient Stated Goal: to go home PT Goal Formulation: With patient Time For Goal Achievement: 10/11/24 Potential to Achieve Goals: Good    Frequency Min 2X/week        AM-PAC PT 6 Clicks Mobility  Outcome Measure Help needed turning from your back to your side while in a flat bed without using bedrails?: None Help needed moving from lying on your back to sitting on the side of a flat bed without using bedrails?: None Help needed moving to and from a bed to a chair (including a wheelchair)?: A Little Help needed standing up from a chair using your arms (e.g., wheelchair or bedside chair)?: A Little Help needed to walk in hospital room?: A  Little Help needed climbing 3-5 steps with a railing? : A Little 6 Click Score: 20    End of Session Equipment Utilized During Treatment: Gait belt Activity Tolerance: Patient tolerated treatment well Patient left: in bed;with call bell/phone within reach Nurse Communication: Mobility status PT Visit Diagnosis: Other abnormalities of gait and mobility (R26.89);Unsteadiness on feet (R26.81)    Time: 8967-8944 PT Time Calculation (min) (ACUTE ONLY): 23 min   Charges:   PT Evaluation $PT Eval Low Complexity: 1 Low PT Treatments $Therapeutic Activity: 8-22 mins PT General Charges $$ ACUTE PT VISIT: 1 Visit        Dorothyann Maier, DPT, CLT  Acute Rehabilitation Services Office: [phone] (Secure chat preferred)   Dorothyann VEAR Maier 09/27/2024, 10:59 AM

## 2024-09-27 NOTE — Progress Notes (Signed)
 Patient had an assisted fall while going to the bathroom with staff assist using the front wheel walker, patient reported Left leg pain and unable to walk to the bathroom, Nurse Tech called for assistance to get patient back in bed. MD notified , vital signs taken blood glucose taken, new orders for NS bolus of 500 cc.

## 2024-09-27 NOTE — Progress Notes (Signed)
 Consultation Progress Note   Patient: Gary Wright FMW:[id number] DOB: [birth year] DOA: 09/26/2024 DOS: the patient was seen and examined on 09/27/2024 Primary service: DibiaLandon BRAVO, MD  Brief hospital course:  78 y.o. male with medical history significant of prior tobacco use, stage IV COPD, CAD s/p CABG, HTN, HLD,  diabetes mellitus, history of pulmonary embolism, chronic avascular necrosis of the left hip s/p  total hip arthroplasty on 11/7 presents with difficulty breathing. Chest x-ray showed no acute abnormality. Patient was bolused 1.5 L of normal saline IV fluids and given Levaquin  750 mg p.o. CT angiogram of the chest was obtained which noted stable pulmonary emboli. Patient was unable to ambulate more than a couple feet prior to tiring out with O2 saturations maintained.  Admitted for COPD exacerbation  Assessment and Plan: Acute respiratory distress COPD exacerbation Advanced COPD -Continue scheduled bronchodilators, discontinued Levaquin  due to prolonged QT. - Continue p.o. steroids - Monitor ambulatory pulse ox daily and document.    Leukocytosis, likely due to steroid use White blood cell count elevated 15.2 which appears to be trending up with similar to most recent prior.  But of note patient does have a history of bacteremia back in October. - Check single blood culture   History of pulmonary embolism On anticoagulation Patient had been diagnosed with pulmonary emboli on 11/29 but had not started medication as of 12/1 when seen by primary care provider.  Since that time patient reports that he started Eliquis  and has been taking it as prescribed. - Continue Eliquis    Prolonged QT interval QTc 513. 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 - Hypoglycemia protocols - Hold glipizide  - CBGs  before every meal with sensitive SSI. - Adjust regimen as deemed medically appropriate   Normocytic anemia Hemoglobin 12.2 which appears around patient's baseline.  - Continue to monitor   Essential hypertension Blood pressures currently maintained. - Continue metoprolol  and pharmacy substitution irbesartan    Diastolic dysfunction Chronic.  Patient appears to be fairly euvolemic on physical exam.  Last echocardiogram noted EF to be 60 to 65% with grade 1 diastolic dysfunction when last checked 06/09/2024.   Depression - Continue Wellbutrin  and Celexa    Coronary artery disease Hyperlipidemia High-sensitivity troponins were negative x 2. - Continue atorvastatin     Deconditioning/debility Patient only able to walk a couple feet without becoming extremely winded needing to rest - PT to evaluate   DVT prophylaxis: Eliquis  Advance Care Planning:   Code Status: Full Code         TRH will continue to follow the patient.  Subjective: Reports improved breathing felt nauseous this morning improved with medications.  Physical Exam: Vitals:   09/27/24 0446 09/27/24 0746 09/27/24 0844 09/27/24 1134  BP: (!) 160/78 (!) 158/97  116/82  Pulse: (!) 113 (!) 118 (!) 115 (!) 110  Resp:  20 20 18   Temp: 98 F (36.7 C) 98.7 F (37.1 C)  98.2 F (36.8 C)  TempSrc: Oral Oral    SpO2: 99% 97% 99% 100%  Weight:      Height:       General  No acute Distress Eyes: PERRL, lids and conjunctivae normal ENMT: Mucous membranes are moist.   Neck: normal, supple, no masses, no thyromegaly Respiratory: End expiratory wheezing noted bilaterally, on supplemental oxygen Cardiovascular: Regular rate and rhythm, no murmurs /  rubs / gallops Abdomen: Soft, nontender nondistended. Musculoskeletal: no clubbing / cyanosis. No joint deformity upper and lower extremities.  Skin: no rashes, lesions, ulcers. No induration Neurologic: Facial asymmetry, moving extremity spontaneously, speech  fluent. Psychiatric: Normal judgment and insight. Alert and oriented x 3. Normal mood.   Data Reviewed:    .CBC    Component Value Date/Time   WBC 15.4 (H) 09/27/2024 0322   RBC 3.08 (L) 09/27/2024 0322   HGB 9.8 (L) 09/27/2024 0322   HCT 28.8 (L) 09/27/2024 0322   PLT 339 09/27/2024 0322   MCV 93.5 09/27/2024 0322   MCH 31.8 09/27/2024 0322   MCHC 34.0 09/27/2024 0322   RDW 13.6 09/27/2024 0322   LYMPHSABS 3.4 09/18/2024 0513   MONOABS 1.4 (H) 09/18/2024 0513   EOSABS 0.4 09/18/2024 0513   BASOSABS 0.1 09/18/2024 0513   CMP     Component Value Date/Time   NA 140 09/27/2024 0322   NA 139 01/18/2021 1423   K 3.4 (L) 09/27/2024 0322   CL 104 09/27/2024 0322   CO2 22 09/27/2024 0322   GLUCOSE 160 (H) 09/27/2024 0322   BUN 14 09/27/2024 0322   BUN 14 01/18/2021 1423   CREATININE 1.06 09/27/2024 0322   CREATININE 0.87 07/01/2016 1032   CALCIUM  8.5 (L) 09/27/2024 0322   PROT 5.6 (L) 09/18/2024 0513   PROT 6.5 12/11/2022 1102   ALBUMIN 3.4 (L) 09/18/2024 0513   ALBUMIN 4.5 12/11/2022 1102   AST 15 09/18/2024 0513   ALT 14 09/18/2024 0513   ALKPHOS 82 09/18/2024 0513   BILITOT 0.3 09/18/2024 0513   BILITOT 0.4 12/11/2022 1102   GFR 59.29 (L) 09/09/2024 1129   EGFR 66 01/18/2021 1423   GFRNONAA >60 09/27/2024 0322      Time spent: 35 minutes.  Author: Landon FORBES Baller, MD 09/27/2024 1:23 PM  For on call review [email or web address].

## 2024-09-27 NOTE — Plan of Care (Signed)

## 2024-09-27 NOTE — Care Management Obs Status (Signed)
 MEDICARE OBSERVATION STATUS NOTIFICATION   Patient Details  Name: Gary Wright MRN: [id number] Date of Birth: 11-26-1945   Medicare Observation Status Notification Given:  Yes  Obs signed and copy given.  Tranell Wojtkiewicz 09/27/2024, 2:43 PM

## 2024-09-28 ENCOUNTER — Ambulatory Visit (HOSPITAL_COMMUNITY)

## 2024-09-28 LAB — COMPREHENSIVE METABOLIC PANEL WITH GFR
ALT: 28 U/L (ref 0–44)
AST: 41 U/L (ref 15–41)
Albumin: 2.6 g/dL — ABNORMAL LOW (ref 3.5–5.0)
Alkaline Phosphatase: 41 U/L (ref 38–126)
Anion gap: 5 (ref 5–15)
BUN: 24 mg/dL — ABNORMAL HIGH (ref 8–23)
CO2: 27 mmol/L (ref 22–32)
Calcium: 8.3 mg/dL — ABNORMAL LOW (ref 8.9–10.3)
Chloride: 106 mmol/L (ref 98–111)
Creatinine, Ser: 2.12 mg/dL — ABNORMAL HIGH (ref 0.61–1.24)
GFR, Estimated: 31 mL/min — ABNORMAL LOW (ref >60)
Glucose, Bld: 93 mg/dL (ref 70–99)
Potassium: 4 mmol/L (ref 3.5–5.1)
Sodium: 138 mmol/L (ref 135–145)
Total Bilirubin: 0.7 mg/dL (ref 0.0–1.2)
Total Protein: 4.7 g/dL — ABNORMAL LOW (ref 6.5–8.1)

## 2024-09-28 LAB — CBC WITH DIFFERENTIAL/PLATELET
Abs Immature Granulocytes: 0.15 K/uL — ABNORMAL HIGH (ref 0.00–0.07)
Basophils Absolute: 0 K/uL (ref 0.0–0.1)
Basophils Relative: 0 %
Eosinophils Absolute: 0 K/uL (ref 0.0–0.5)
Eosinophils Relative: 0 %
HCT: 18.8 % — ABNORMAL LOW (ref 39.0–52.0)
Hemoglobin: 6.2 g/dL — CL (ref 13.0–17.0)
Immature Granulocytes: 1 %
Lymphocytes Relative: 7 %
Lymphs Abs: 1 K/uL (ref 0.7–4.0)
MCH: 31.8 pg (ref 26.0–34.0)
MCHC: 33 g/dL (ref 30.0–36.0)
MCV: 96.4 fL (ref 80.0–100.0)
Monocytes Absolute: 0.8 K/uL (ref 0.1–1.0)
Monocytes Relative: 6 %
Neutro Abs: 11.2 K/uL — ABNORMAL HIGH (ref 1.7–7.7)
Neutrophils Relative %: 86 %
Platelets: 269 K/uL (ref 150–400)
RBC: 1.95 MIL/uL — ABNORMAL LOW (ref 4.22–5.81)
RDW: 13.8 % (ref 11.5–15.5)
WBC: 13.1 K/uL — ABNORMAL HIGH (ref 4.0–10.5)
nRBC: 0 % (ref 0.0–0.2)

## 2024-09-28 LAB — IRON AND TIBC
Iron: 33 ug/dL — ABNORMAL LOW (ref 45–182)
Saturation Ratios: 15 % — ABNORMAL LOW (ref 17.9–39.5)
TIBC: 224 ug/dL — ABNORMAL LOW (ref 250–450)
UIBC: 191 ug/dL

## 2024-09-28 LAB — GLUCOSE, CAPILLARY
Glucose-Capillary: 123 mg/dL — ABNORMAL HIGH (ref 70–99)
Glucose-Capillary: 116 mg/dL — ABNORMAL HIGH (ref 70–99)
Glucose-Capillary: 248 mg/dL — ABNORMAL HIGH (ref 70–99)
Glucose-Capillary: 143 mg/dL — ABNORMAL HIGH (ref 70–99)

## 2024-09-28 LAB — HEMOGLOBIN AND HEMATOCRIT, BLOOD
HCT: 23.8 % — ABNORMAL LOW (ref 39.0–52.0)
Hemoglobin: 8 g/dL — ABNORMAL LOW (ref 13.0–17.0)

## 2024-09-28 LAB — PREPARE RBC (CROSSMATCH)

## 2024-09-28 MED ORDER — SODIUM CHLORIDE 0.9 % IV BOLUS
1000.0000 mL | Freq: Once | INTRAVENOUS | Status: AC
  Administered 2024-09-28: 1000 mL via INTRAVENOUS

## 2024-09-28 MED ORDER — METOPROLOL SUCCINATE ER 25 MG PO TB24
25.0000 mg | ORAL_TABLET | Freq: Every day | ORAL | Status: DC
  Administered 2024-09-28: 25 mg via ORAL
  Filled 2024-09-28: qty 1

## 2024-09-28 MED ORDER — METHYLPREDNISOLONE SODIUM SUCC 40 MG IJ SOLR
40.0000 mg | Freq: Two times a day (BID) | INTRAMUSCULAR | Status: DC
  Administered 2024-09-28 – 2024-09-29 (×4): 40 mg via INTRAVENOUS
  Filled 2024-09-28 (×4): qty 1

## 2024-09-28 MED ORDER — CITALOPRAM HYDROBROMIDE 20 MG PO TABS
20.0000 mg | ORAL_TABLET | Freq: Every day | ORAL | Status: DC
  Administered 2024-09-29 – 2024-10-02 (×4): 20 mg via ORAL
  Filled 2024-09-28 (×4): qty 1

## 2024-09-28 MED ORDER — SODIUM CHLORIDE 0.9% IV SOLUTION: Freq: Once | INTRAVENOUS | Status: DC

## 2024-09-28 NOTE — Progress Notes (Signed)
 Leah from lab called with a critical value for Hgb of 6.2. Notified provider.

## 2024-09-28 NOTE — Plan of Care (Signed)

## 2024-09-28 NOTE — Progress Notes (Signed)
 Consultation Progress Note   Patient: Gary Wright DOB: 1946/10/15 DOA: 09/26/2024 DOS: the patient was seen and examined on 09/28/2024 Primary service: DibiaLandon BRAVO, MD  Brief hospital course:  78 y.o. male with medical history significant of prior tobacco use, stage IV COPD, CAD s/p CABG, HTN, HLD,  diabetes mellitus, history of pulmonary embolism, chronic avascular necrosis of the left hip s/p  total hip arthroplasty on 11/7 presents with difficulty breathing. Chest x-ray showed no acute abnormality. Patient was bolused 1.5 L of normal saline IV fluids and given Levaquin  750 mg p.o. CT angiogram of the chest was obtained which noted stable pulmonary emboli. Patient was unable to ambulate more than a couple feet prior to tiring out with O2 saturations maintained.  Admitted for COPD exacerbation  Assessment and Plan:  Blood loss anemia -In setting of eliquis  use Hemoglobin dropped to 6.2, Transfuse 1 unit of PRBCs repeat hemoglobin been 4 hours posttransfusion Check hemoccult, TIBC, Iron level  Acute respiratory distress COPD exacerbation Advanced COPD -Continue scheduled bronchodilators, discontinued Levaquin  due to prolonged QT. - Continue p.o. steroids - Monitor ambulatory pulse ox daily and document.    Leukocytosis, likely due to steroid use White blood cell count elevated 15.2 which appears to be trending up with similar to most recent prior.  But of note patient does have a history of bacteremia back in October. - Check single blood culture   History of pulmonary embolism On anticoagulation Patient had been diagnosed with pulmonary emboli on 11/29 but had not started medication as of 12/1 when seen by primary care provider.  Since that time patient reports that he started Eliquis  and has been taking it as prescribed. - Continue Eliquis    Prolonged QT interval QTc 513. 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 - Hypoglycemia protocols - Hold glipizide  - CBGs before every meal with sensitive SSI. - Adjust regimen as deemed medically appropriate   Normocytic anemia Hemoglobin 12.2 which appears around patient's baseline.  - Continue to monitor   Essential hypertension Blood pressures currently maintained. - Continue metoprolol  and pharmacy substitution irbesartan    Diastolic dysfunction Chronic.  Patient appears to be fairly euvolemic on physical exam.  Last echocardiogram noted EF to be 60 to 65% with grade 1 diastolic dysfunction when last checked 06/09/2024.   Depression - Continue Wellbutrin  and Celexa    Coronary artery disease Hyperlipidemia High-sensitivity troponins were negative x 2. - Continue atorvastatin     Deconditioning/debility Patient only able to walk a couple feet without becoming extremely winded needing to rest - PT to evaluate   DVT prophylaxis: Eliquis  Advance Care Planning:   Code Status: Full Code         TRH will continue to follow the patient.  Subjective: Seen at bedside this morning, he states that he feels better this morning.  He was requesting to go home however hemoglobin dropped to 6.2.  Yesterday evening patient received a liter of fluids due to his low blood pressure, his BP is acceptable but soft.  Patient is tachycardic likely due to his ongoing anemia.  Patient will need close monitoring and trending of his hemoglobin.  Hemoccult iron profile pending  Physical Exam: Vitals:   09/28/24 0522 09/28/24 0735 09/28/24 0816 09/28/24 1207  BP: 108/80 (!) 122/98  (!) 102/55  Pulse: (!) 114 100 (!) 111 ROLLEN)  108  Resp: 18 18 18 18   Temp: 99.5 F (37.5 C) 98.5 F (36.9 C)  98.5 F (36.9 C)  TempSrc:  Oral  Oral  SpO2: 96% 97% 97% 99%  Weight:      Height:       General  No acute Distress Eyes: PERRL,  lids and conjunctivae normal ENMT: Mucous membranes are moist.   Neck: normal, supple, no masses, no thyromegaly Respiratory: End expiratory wheezing noted bilaterally, on supplemental oxygen Cardiovascular: Regular rate and rhythm, no murmurs / rubs / gallops Abdomen: Soft, nontender nondistended. Musculoskeletal: no clubbing / cyanosis. No joint deformity upper and lower extremities.  Skin: no rashes, lesions, ulcers. No induration Neurologic: Facial asymmetry, moving extremity spontaneously, speech fluent. Psychiatric: Normal judgment and insight. Alert and oriented x 3. Normal mood.   Data Reviewed:    .CBC    Component Value Date/Time   WBC 13.1 (H) 09/28/2024 1057   RBC 1.95 (L) 09/28/2024 1057   HGB 6.2 (LL) 09/28/2024 1057   HCT 18.8 (L) 09/28/2024 1057   PLT 269 09/28/2024 1057   MCV 96.4 09/28/2024 1057   MCH 31.8 09/28/2024 1057   MCHC 33.0 09/28/2024 1057   RDW 13.8 09/28/2024 1057   LYMPHSABS 1.0 09/28/2024 1057   MONOABS 0.8 09/28/2024 1057   EOSABS 0.0 09/28/2024 1057   BASOSABS 0.0 09/28/2024 1057   CMP     Component Value Date/Time   NA 138 09/28/2024 1057   NA 139 01/18/2021 1423   K 4.0 09/28/2024 1057   CL 106 09/28/2024 1057   CO2 27 09/28/2024 1057   GLUCOSE 93 09/28/2024 1057   BUN 24 (H) 09/28/2024 1057   BUN 14 01/18/2021 1423   CREATININE 2.12 (H) 09/28/2024 1057   CREATININE 0.87 07/01/2016 1032   CALCIUM  8.3 (L) 09/28/2024 1057   PROT 4.7 (L) 09/28/2024 1057   PROT 6.5 12/11/2022 1102   ALBUMIN  2.6 (L) 09/28/2024 1057   ALBUMIN  4.5 12/11/2022 1102   AST 41 09/28/2024 1057   ALT 28 09/28/2024 1057   ALKPHOS 41 09/28/2024 1057   BILITOT 0.7 09/28/2024 1057   BILITOT 0.4 12/11/2022 1102   GFR 59.29 (L) 09/09/2024 1129   EGFR 66 01/18/2021 1423   GFRNONAA 31 (L) 09/28/2024 1057      Time spent: 35 minutes.  Author: Landon FORBES Baller, MD 09/28/2024 12:46 PM  For on call review [email or web address].

## 2024-09-28 NOTE — Progress Notes (Signed)
 Transition of Care [facility]) - Inpatient Brief Assessment   Patient Details  Name: Gary Wright MRN: [id number] Date of Birth: April 22, 1946  Transition of Care [facility] CM/SW Contact:    Rosaline JONELLE Joe, RN Phone Number: 09/28/2024, 4:26 PM   Clinical Narrative: Patient admitted to the hospital with COPD.  No IP Care management needs at this time.   Transition of Care Asessment: Insurance and Status: (P) Insurance coverage has been reviewed Patient has primary care physician: (P) Yes Home environment has been reviewed: (P) from home Prior level of function:: (P) self Prior/Current Home Services: (P) No current home services Social Drivers of Health Review: (P) SDOH reviewed no interventions necessary Readmission risk has been reviewed: (P) Yes Transition of care needs: (P) no transition of care needs at this time

## 2024-09-29 ENCOUNTER — Other Ambulatory Visit (HOSPITAL_COMMUNITY): Payer: Self-pay

## 2024-09-29 DIAGNOSIS — I2699 Other pulmonary embolism without acute cor pulmonale: Secondary | ICD-10-CM

## 2024-09-29 DIAGNOSIS — Z7901 Long term (current) use of anticoagulants: Secondary | ICD-10-CM

## 2024-09-29 DIAGNOSIS — R630 Anorexia: Secondary | ICD-10-CM

## 2024-09-29 DIAGNOSIS — R195 Other fecal abnormalities: Secondary | ICD-10-CM

## 2024-09-29 DIAGNOSIS — D62 Acute posthemorrhagic anemia: Secondary | ICD-10-CM

## 2024-09-29 DIAGNOSIS — R634 Abnormal weight loss: Secondary | ICD-10-CM

## 2024-09-29 LAB — COMPREHENSIVE METABOLIC PANEL WITH GFR
ALT: 87 U/L — ABNORMAL HIGH (ref 0–44)
AST: 72 U/L — ABNORMAL HIGH (ref 15–41)
Albumin: 2.7 g/dL — ABNORMAL LOW (ref 3.5–5.0)
Alkaline Phosphatase: 48 U/L (ref 38–126)
Anion gap: 10 (ref 5–15)
BUN: 21 mg/dL (ref 8–23)
CO2: 21 mmol/L — ABNORMAL LOW (ref 22–32)
Calcium: 8.4 mg/dL — ABNORMAL LOW (ref 8.9–10.3)
Chloride: 108 mmol/L (ref 98–111)
Creatinine, Ser: 1.52 mg/dL — ABNORMAL HIGH (ref 0.61–1.24)
GFR, Estimated: 47 mL/min — ABNORMAL LOW (ref >60)
Glucose, Bld: 193 mg/dL — ABNORMAL HIGH (ref 70–99)
Potassium: 4.9 mmol/L (ref 3.5–5.1)
Sodium: 139 mmol/L (ref 135–145)
Total Bilirubin: 0.7 mg/dL (ref 0.0–1.2)
Total Protein: 4.9 g/dL — ABNORMAL LOW (ref 6.5–8.1)

## 2024-09-29 LAB — BPAM RBC
Blood Product Expiration Date: 202512312359
ISSUE DATE / TIME: 202512091355
Unit Type and Rh: 5100

## 2024-09-29 LAB — GLUCOSE, CAPILLARY
Glucose-Capillary: 170 mg/dL — ABNORMAL HIGH (ref 70–99)
Glucose-Capillary: 188 mg/dL — ABNORMAL HIGH (ref 70–99)
Glucose-Capillary: 178 mg/dL — ABNORMAL HIGH (ref 70–99)
Glucose-Capillary: 157 mg/dL — ABNORMAL HIGH (ref 70–99)

## 2024-09-29 LAB — CBC
HCT: 26.3 % — ABNORMAL LOW (ref 39.0–52.0)
Hemoglobin: 8.6 g/dL — ABNORMAL LOW (ref 13.0–17.0)
MCH: 31.2 pg (ref 26.0–34.0)
MCHC: 32.7 g/dL (ref 30.0–36.0)
MCV: 95.3 fL (ref 80.0–100.0)
Platelets: 313 K/uL (ref 150–400)
RBC: 2.76 MIL/uL — ABNORMAL LOW (ref 4.22–5.81)
RDW: 14.4 % (ref 11.5–15.5)
WBC: 17.3 K/uL — ABNORMAL HIGH (ref 4.0–10.5)
nRBC: 0 % (ref 0.0–0.2)

## 2024-09-29 LAB — TYPE AND SCREEN
ABO/RH(D): O POS
Antibody Screen: NEGATIVE
Unit division: 0

## 2024-09-29 LAB — OCCULT BLOOD X 1 CARD TO LAB, STOOL: Fecal Occult Bld: POSITIVE — AB

## 2024-09-29 MED ORDER — ENSURE PLUS HIGH PROTEIN PO LIQD
237.0000 mL | Freq: Two times a day (BID) | ORAL | Status: DC
  Administered 2024-09-29 – 2024-10-01 (×3): 237 mL via ORAL

## 2024-09-29 MED ORDER — SIMETHICONE 80 MG PO CHEW
240.0000 mg | CHEWABLE_TABLET | Freq: Once | ORAL | Status: AC
  Administered 2024-09-29: 240 mg via ORAL
  Filled 2024-09-29: qty 3

## 2024-09-29 MED ORDER — HYDROCODONE-ACETAMINOPHEN 5-325 MG PO TABS
2.0000 | ORAL_TABLET | ORAL | Status: DC | PRN
  Administered 2024-09-29 – 2024-10-01 (×7): 2 via ORAL
  Filled 2024-09-29 (×9): qty 2

## 2024-09-29 MED ORDER — SENNOSIDES-DOCUSATE SODIUM 8.6-50 MG PO TABS
2.0000 | ORAL_TABLET | Freq: Every day | ORAL | Status: DC
  Administered 2024-09-29 – 2024-10-01 (×3): 2 via ORAL
  Filled 2024-09-29 (×3): qty 2

## 2024-09-29 MED ORDER — NA SULFATE-K SULFATE-MG SULF 17.5-3.13-1.6 GM/177ML PO SOLN
0.5000 | Freq: Once | ORAL | Status: AC
  Administered 2024-09-29: 177 mL via ORAL

## 2024-09-29 MED ORDER — SODIUM CHLORIDE 0.9 % IV SOLN
125.0000 mg | Freq: Every day | INTRAVENOUS | Status: AC
  Administered 2024-09-29 – 2024-09-30 (×2): 125 mg via INTRAVENOUS
  Filled 2024-09-29 (×2): qty 10

## 2024-09-29 MED ORDER — DUPILUMAB 300 MG/2ML ~~LOC~~ SOAJ
300.0000 mg | SUBCUTANEOUS | Status: DC
  Administered 2024-09-29: 300 mg via SUBCUTANEOUS
  Filled 2024-09-29 (×2): qty 2

## 2024-09-29 MED ORDER — FAMOTIDINE 20 MG PO TABS
40.0000 mg | ORAL_TABLET | Freq: Every day | ORAL | Status: DC
  Administered 2024-09-29 – 2024-10-02 (×4): 40 mg via ORAL
  Filled 2024-09-29 (×4): qty 2

## 2024-09-29 MED ORDER — NA SULFATE-K SULFATE-MG SULF 17.5-3.13-1.6 GM/177ML PO SOLN
0.5000 | Freq: Once | ORAL | Status: AC
  Administered 2024-09-29: 177 mL via ORAL
  Filled 2024-09-29: qty 1

## 2024-09-29 MED ORDER — POLYETHYLENE GLYCOL 3350 17 GM/SCOOP PO POWD
119.0000 g | Freq: Once | ORAL | Status: AC
  Administered 2024-09-29: 119 g via ORAL
  Filled 2024-09-29: qty 119

## 2024-09-29 MED ORDER — SODIUM CHLORIDE 0.9 % IV SOLN: INTRAVENOUS | Status: AC

## 2024-09-29 NOTE — Plan of Care (Signed)

## 2024-09-29 NOTE — Progress Notes (Signed)
 Physical Therapy Treatment Patient Details Name: Gary Wright MRN: [id number] DOB: [birth year] Today's Date: 09/29/2024   History of Present Illness Pt is 78 yo presenting to [facility] On 12/7 due to progressive shortness of breathe. PMH: recent L THA 11/7, stave IV COPD, CAD s/p CABG, Htn, HLD, DM, hx PE, chronic avascular necrosis of L hip.    PT Comments  Pt in bed, RN present for medication administration and reports RT should be coming to give breathing treatment. Pt agreeable to low level mobility and is able to stand and ambulate in room and move to recliner. Continued pain in L hip with weightbearing, and decreased functional endurance. Tolerated session on RA with spO2 >95%, HR continues to elevate with activity up to 105. Pt appears anxious and perseverates on colonoscopy planned for tomorrow and prep tonight, concerns about appetite and poor sleep. PT provides emotional support and education as able to on topic. He will continue to benefit from physical therapy while in acute hospital to continue with mobility and endurance gains. No recommended follow up at discharge.    If plan is discharge home, recommend the following: Assistance with cooking/housework;Assist for transportation;Help with stairs or ramp for entrance   Can travel by private vehicle        Equipment Recommendations  None recommended by PT    Recommendations for Other Services       Precautions / Restrictions Precautions Precautions: Fall Recall of Precautions/Restrictions: Intact Precaution/Restrictions Comments: watch O2 and HR with mobility Restrictions Weight Bearing Restrictions Per Provider Order: No     Mobility  Bed Mobility Overal bed mobility: Modified Independent                  Transfers Overall transfer level: Needs assistance Equipment used: Rolling walker (2 wheels) Transfers: Sit to/from Stand Sit to Stand: Contact guard assist           General transfer comment: CGA  for safety, O2 sats 98% HR 106 bpm    Ambulation/Gait Ambulation/Gait assistance: Contact guard assist Gait Distance (Feet): 30 Feet Assistive device: Rolling walker (2 wheels) Gait Pattern/deviations: Step-through pattern, Decreased stride length, Trunk flexed Gait velocity: decreased     General Gait Details: with low level mobility HR 100-105, spO2 >95% on RA, limited participation in mobility due to L hip pain when standing, continued wet cough with planned breathing treatment and low energy levels   Stairs             Wheelchair Mobility     Tilt Bed    Modified Rankin (Stroke Patients Only)       Balance Overall balance assessment: Needs assistance Sitting-balance support: No upper extremity supported, Feet supported Sitting balance-Leahy Scale: Good     Standing balance support: During functional activity, Reliant on assistive device for balance Standing balance-Leahy Scale: Fair Standing balance comment: cues for slowed controlled mobility as patient moves quickly onces standing despite pain and low energy levels                            Communication Communication Communication: No apparent difficulties  Cognition Arousal: Alert Behavior During Therapy: WFL for tasks assessed/performed   PT - Cognitive impairments: No apparent impairments                       PT - Cognition Comments: tangential with conversation at times, perseverates on colonoscopy prep and meals  requiring intermittent  redirection Following commands: Intact      Cueing Cueing Techniques: Verbal cues  Exercises      General Comments        Pertinent Vitals/Pain Pain Assessment Pain Assessment: Faces Faces Pain Scale: Hurts little more Pain Location: L hip with mobility Pain Descriptors / Indicators: Aching, Grimacing, Discomfort Pain Intervention(s): Limited activity within patient's tolerance, Monitored during session, Repositioned    Home  Living                          Prior Function            PT Goals (current goals can now be found in the care plan section) Acute Rehab PT Goals Patient Stated Goal: to go home PT Goal Formulation: With patient Time For Goal Achievement: 10/11/24 Potential to Achieve Goals: Good Progress towards PT goals: Progressing toward goals    Frequency    Min 2X/week      PT Plan      Co-evaluation              AM-PAC PT 6 Clicks Mobility   Outcome Measure  Help needed turning from your back to your side while in a flat bed without using bedrails?: None Help needed moving from lying on your back to sitting on the side of a flat bed without using bedrails?: None Help needed moving to and from a bed to a chair (including a wheelchair)?: A Little Help needed standing up from a chair using your arms (e.g., wheelchair or bedside chair)?: A Little Help needed to walk in hospital room?: A Little Help needed climbing 3-5 steps with a railing? : A Little 6 Click Score: 20    End of Session Equipment Utilized During Treatment: Gait belt Activity Tolerance: Patient tolerated treatment well Patient left: in chair;with call bell/phone within reach;with chair alarm set Nurse Communication: Mobility status PT Visit Diagnosis: Other abnormalities of gait and mobility (R26.89);Unsteadiness on feet (R26.81)     Time: 8484-8461 PT Time Calculation (min) (ACUTE ONLY): 23 min  Charges:    $Therapeutic Activity: 23-37 mins                       Isaiah H. Adar Rase, PT, DPT    [employer] 09/29/2024, 3:52 PM

## 2024-09-29 NOTE — Consult Note (Signed)
 Consultation Note   Referring Provider:   [facility] PCP: Lucius Krabbe, NP Primary Gastroenterologist:   None ETTER [name] PCP)      Reason for Consultation: GI bleed DOA: 09/26/2024         Hospital Day: 4   ASSESSMENT    78 year old male on Eliquis  for recently diagnosed pulmonary embolism following hip surgery early November.   Boron Anemia -acute drop in hgb / FOBT +  Hemoglobin was in the 11-12 range following hip replacement in November.  Now with hemoglobin of 6.2 after starting Eliquis  several days ago.  Surprisingly patient has not noticed any overt GI bleeding.  Rule out PUD, gastrointestinal malignancy.  ? Iron deficient, iron studies not straight forward. He did receive a dose of IV iron.   Nausea Unexplained weight loss Rule out PUD, gastrointestinal malignancy.  Could be medication related.  Weight loss could also be the result of prolonged illness / recent hospitalizations dating back to October  History of osteonecrosis of left hip, status post total hip arthroplasty on 08/27/24  Prolonged QT interval  COPD Asthma History of lung cancer              Hypertension  CAD status post CABG   CKD 3  See PMH for any additional medical history  / medical problems  Principal Problem:   COPD with acute exacerbation (HCC) Active Problems:   Depression   Essential hypertension   Mild CAD   Prolonged QT interval   Hyperlipidemia   Drug-induced diabetes mellitus   Leukocytosis   COPD exacerbation (HCC)   Acute respiratory distress   History of pulmonary embolism   Normocytic anemia   Diastolic dysfunction   Physical deconditioning    PLAN:   --Empiric famotidine  40 mg daily. Decided against PPI in light of prolonged QT interval --Trend H/H --Will need EGD and colonoscopy. Timing to be announced. The risks and benefits of EGD with possible biopsies and colonoscopy with possible biopsies and removal of  polyps were discussed with the patient who agrees to proceed.  HPI   Brief History:   Patient is a 78 year old male with multiple medical problems.  He was hospitalized last month with avascular necrosis in left hip and underwent a total left hip arthroplasty on 08/27/2024.  He presented to the ED on 09/18/2024 with wheezing.  Treated for COPD exacerbation but also found to have small PEs without right heart strain.  He was started on Eliquis .  He was released from the ED. and had follow-up appointment with his PCP a few days later  Interval History:   Patient came back to the ED on 09/26/2024 with shortness of breath and wheezing.  CTA showed stable PE .  Hemoglobin 12.2, stable compared to recent labs .  Patient was admitted for COPD exacerbation.  Over the last couple days patient hemoglobin has drastically declined to 6.2.  He is Hemoccult positive.  He denies overt GI blood loss.  Denies blood in urine..  No nosebleeds or other overt bleeding.  He has been having frequent bouts of nausea over the last several weeks but no vomiting.  No abdominal pain.  No bowel changes. He endorses an involuntary 20 pound weight loss over the  last 3 months due to poor appetite.   No NSAID use   Pertinent GI Studies   No prior history of colonoscopy   Labs and Imaging:  Recent Labs    09/28/24 1057 09/29/24 0904  PROT 4.7* 4.9*  ALBUMIN 2.6* 2.7*  AST 41 72*  ALT 28 87*  ALKPHOS 41 48  BILITOT 0.7 0.7   Recent Labs    09/27/24 0322 09/28/24 1057 09/28/24 1934 09/29/24 0904  WBC 15.4* 13.1*  --  17.3*  HGB 9.8* 6.2* 8.0* 8.6*  HCT 28.8* 18.8* 23.8* 26.3*  MCV 93.5 96.4  --  95.3  PLT 339 269  --  313   Recent Labs    09/27/24 0322 09/28/24 1057 09/29/24 0904  NA 140 138 139  K 3.4* 4.0 4.9  CL 104 106 108  CO2 22 27 21*  GLUCOSE 160* 93 193*  BUN 14 24* 21  CREATININE 1.06 2.12* 1.52*  CALCIUM  8.5* 8.3* 8.4*     CT Angio Chest PE W and/or Wo Contrast EXAM: CTA of the  Chest with contrast for PE 09/26/2024 10:37:58 AM  TECHNIQUE: CTA of the chest was performed after the administration of 75 mL of iohexol  (OMNIPAQUE ) 350 MG/ML injection. Multiplanar reformatted images are provided for review. MIP images are provided for review. Automated exposure control, iterative reconstruction, and/or weight based adjustment of the mA/kV was utilized to reduce the radiation dose to as low as reasonably achievable.  COMPARISON: Compared to a prior study.  CLINICAL HISTORY: Pulmonary embolism (PE) suspected, high prob.  FINDINGS:  PULMONARY ARTERIES: Pulmonary arteries are adequately opacified for evaluation. Segmental pulmonary embolus in the lateral and posterior basilar segments of the right lower lobe are similar to the prior study. Nonocclusive saddle embolus at the bifurcation of the right middle lobar pulmonary artery is stable. Segmental embolus in the basilar segmental artery of the left lower lobe is stable. Main pulmonary artery is normal in caliber.  MEDIASTINUM: The heart and pericardium demonstrate no acute abnormality. Coronary artery calcifications are present. Atherosclerotic calcifications are present at the aortic arch and great vessel origins. No acute abnormality of the thoracic aorta such as dissection, intramural hematoma, aortic ulceration, or rupture.  LYMPH NODES: No mediastinal, hilar or axillary lymphadenopathy.  LUNGS AND PLEURA: Centrilobular emphysematous changes are present. No focal consolidation or pulmonary edema. No pleural effusion or pneumothorax.  UPPER ABDOMEN: Limited images of the upper abdomen are unremarkable.  SOFT TISSUES AND BONES: A remote posterior left eighth nonunion rib fracture is again noted. No acute soft tissue abnormality.  IMPRESSION: 1. Stable pulmonary emboli, including segmental emboli in the lateral and posterior basilar segments of the right lower lobe, a nonocclusive saddle embolus at  the bifurcation of the right middle lobar pulmonary artery, and a segmental embolus in the basilar segmental artery of the left lower lobe.  Electronically signed by: Lonni Necessary MD 09/26/2024 10:57 AM EST RP Workstation: [id number] DG Chest Portable 1 View CLINICAL DATA:  Chest pain  EXAM: PORTABLE CHEST 1 VIEW  COMPARISON:  09/18/2024  FINDINGS: The lungs are clear without focal pneumonia, edema, pneumothorax or pleural effusion. The cardiopericardial silhouette is within normal limits for size. Telemetry leads overlie the chest.  IMPRESSION: No active disease.  Electronically Signed   By: Camellia Candle M.D.   On: 09/26/2024 07:31     Past Medical History:  Diagnosis Date   Acute dyspnea 03/24/2022   Acute exacerbation of chronic obstructive pulmonary disease (COPD) (HCC) 03/25/2022  Asthma    CAD (coronary artery disease) of artery bypass graft 06/13/2016   Chest pain    COPD (chronic obstructive pulmonary disease) (HCC)    COPD with acute exacerbation (HCC) 11/02/2020   Quit smokig 2019  - PFT's  121/22/23  FEV1 1.83 (60 % ) ratio 0.57  p 15 % improvement from saba p 0 prior to study with DLCO  16.4 (65%)   and FV curve mildly concave  - try off acei and on otc gerd rx 06/02/2023 >>>       Depression 09/01/2007   Qualifier: Diagnosis of  By: Nickola CMA, [place]     Elevated troponin 06/13/2016   History of lung cancer 11/02/2020   Hyperlipidemia 11/02/2020   Hypertension    Hypocalcemia 03/24/2022   Leukocytosis 08/05/2024   Prolonged QT interval 11/02/2020   SOB (shortness of breath) 06/12/2024   Stress-induced cardiomyopathy 06/13/2016   TOBACCO ABUSE 09/01/2007   Qualifier: Diagnosis of   By: Nickola CMA, [place]          Past Surgical History:  Procedure Laterality Date   BACK SURGERY     fusion in 1973   CARDIAC CATHETERIZATION N/A 06/13/2016   Procedure: Left Heart Cath and Coronary Angiography;  Surgeon: Lonni Hanson, MD;  Location: [facility]  INVASIVE CV LAB;  Service: Cardiovascular;  Laterality: N/A;   INGUINAL HERNIA REPAIR Right 04/21/2018   Procedure: RIGHT INGUINAL HERNIA REPAIR ERAS PATHWAY;  Surgeon: Vanderbilt Ned, MD;  Location: [facility] OR;  Service: General;  Laterality: Right;  TAP BLOCK   INSERTION OF MESH Right 04/21/2018   Procedure: INSERTION OF MESH;  Surgeon: Vanderbilt Ned, MD;  Location: [facility] OR;  Service: General;  Laterality: Right;   LUNG REMOVAL, PARTIAL Left    left upper lung removed about 6-7 years ago reported by pt   TONSILLECTOMY     TOTAL HIP ARTHROPLASTY Left 08/27/2024   Procedure: ARTHROPLASTY, HIP, TOTAL, ANTERIOR APPROACH;  Surgeon: Jerri Kay HERO, MD;  Location: [facility] OR;  Service: Orthopedics;  Laterality: Left;    Family History  Problem Relation Age of Onset   Hypertension Mother    Hypertension Father     Prior to Admission medications   Medication Sig Start Date End Date Taking? Authorizing Provider  acetaminophen  (TYLENOL ) 325 MG tablet Take 1-2 tablets (325-650 mg total) by mouth every 6 (six) hours as needed for mild pain (pain score 1-3) or fever (or temp > 100.5). 08/31/24  Yes Sheikh, Omair Latif, DO  Albuterol -Budesonide  (AIRSUPRA ) 90-80 MCG/ACT AERO Inhale 2 puffs into the lungs 4 (four) times daily as needed. 09/09/24  Yes Jesus Bernardino MATSU, MD  APIXABAN  (ELIQUIS ) VTE STARTER PACK (10MG  AND 5MG ) Take as directed on package: start with two-5mg  tablets twice daily for 7 days. On day 8, switch to one-5mg  tablet twice daily. Patient taking differently: 1 tab twice daily 09/18/24  Yes Kammerer, Megan L, DO  atorvastatin  (LIPITOR) 40 MG tablet TAKE 1 TABLET(40 MG) BY MOUTH DAILY AT 6 PM 08/18/23  Yes Crenshaw, Redell RAMAN, MD  buPROPion  (WELLBUTRIN  XL) 300 MG 24 hr tablet Take 300 mg by mouth daily. 05/11/16  Yes [provider]  citalopram  (CELEXA ) 40 MG tablet Take 40 mg by mouth daily. 06/01/16  Yes [provider]  Dupilumab  (DUPIXENT ) 300 MG/2ML SOAJ Inject 300 mg into the skin  every 14 (fourteen) days. 07/21/24  Yes [name] Dorn NOVAK, MD  Fluticasone -Umeclidin-Vilant (TRELEGY ELLIPTA ) 200-62.5-25 MCG/ACT AEPB Inhale 1 puff into the lungs every evening. 09/09/24  Yes Jesus Bernardino MATSU, MD  glipiZIDE  (GLUCOTROL  XL) 5 MG 24 hr tablet Take 1 tablet (5 mg total) by mouth 2 (two) times daily after a meal. 09/20/24  Yes Hudnell, Stephanie, NP  ipratropium-albuterol  (DUONEB) 0.5-2.5 (3) MG/3ML SOLN Take 3 mLs by nebulization every 6 (six) hours as needed. 09/09/24  Yes Jesus Bernardino MATSU, MD  metoprolol  succinate (TOPROL -XL) 25 MG 24 hr tablet Take 0.5 tablets (12.5 mg total) by mouth See admin instructions. Take 1/2 tablet (12.5mg ) by mouth once daily at noon. 08/06/24  Yes Arlon Carliss ORN, DO  olmesartan  (BENICAR ) 40 MG tablet Take 1 tablet (40 mg total) by mouth daily. 06/29/24 06/29/25 Yes Darlean Ozell NOVAK, MD  triamcinolone  ointment (KENALOG ) 0.5 % Apply topically 2 (two) times daily. Patient taking differently: Apply 1 Application topically 2 (two) times daily as needed (skin irritaiton). 06/24/24  Yes Lucius Krabbe, NP    Current Facility-Administered Medications  Medication Dose Route Frequency Provider Last Rate Last Admin   0.9 %  sodium chloride  infusion (Manually program via Guardrails IV Fluids)   Intravenous Once Dibia, Pauline E, MD       acetaminophen  (TYLENOL ) tablet 650 mg  650 mg Oral Q6H PRN Smith, Rondell A, MD   650 mg at 09/27/24 1053   Or   acetaminophen  (TYLENOL ) suppository 650 mg  650 mg Rectal Q6H PRN Smith, Rondell A, MD       albuterol  (PROVENTIL ) (2.5 MG/3ML) 0.083% nebulizer solution 2.5 mg  2.5 mg Nebulization Q2H PRN Smith, Rondell A, MD   2.5 mg at 09/28/24 0209   arformoterol  (BROVANA ) nebulizer solution 15 mcg  15 mcg Nebulization BID Smith, Rondell A, MD   15 mcg at 09/29/24 0841   atorvastatin  (LIPITOR) tablet 40 mg  40 mg Oral QPM Smith, Rondell A, MD   40 mg at 09/28/24 1717   budesonide  (PULMICORT ) nebulizer solution 0.5 mg  0.5 mg  Nebulization BID Smith, Rondell A, MD   0.5 mg at 09/29/24 9158   buPROPion  (WELLBUTRIN  XL) 24 hr tablet 300 mg  300 mg Oral Daily Smith, Rondell A, MD   300 mg at 09/29/24 0809   citalopram  (CELEXA ) tablet 20 mg  20 mg Oral Daily Dibia, Pauline E, MD   20 mg at 09/29/24 0809   Dupilumab  SOAJ 300 mg  300 mg Subcutaneous Q14 Days Pham, Minh Q, RPH-CPP       feeding supplement (ENSURE PLUS HIGH PROTEIN) liquid 237 mL  237 mL Oral BID BM Dibia, Pauline E, MD   237 mL at 09/29/24 9185   ferric gluconate (FERRLECIT ) 125 mg in sodium chloride  0.9 % 100 mL IVPB  125 mg Intravenous Daily Dibia, Pauline E, MD       HYDROcodone -acetaminophen  (NORCO/VICODIN) 5-325 MG per tablet 2 tablet  2 tablet Oral Q4H PRN Dibia, Pauline E, MD       insulin  aspart (novoLOG ) injection 0-5 Units  0-5 Units Subcutaneous QHS Smith, Rondell A, MD       insulin  aspart (novoLOG ) injection 0-9 Units  0-9 Units Subcutaneous TID WC Smith, Rondell A, MD   2 Units at 09/29/24 1258   irbesartan  (AVAPRO ) tablet 300 mg  300 mg Oral Daily Smith, Rondell A, MD   300 mg at 09/29/24 0809   LORazepam  (ATIVAN ) injection 0.5 mg  0.5 mg Intravenous Q6H PRN Dibia, Pauline E, MD   0.5 mg at 09/27/24 1555   methylPREDNISolone  sodium succinate (SOLU-MEDROL ) 40 mg/mL injection 40 mg  40 mg Intravenous Q12H  DibiaLandon BRAVO, MD   40 mg at 09/29/24 0809   senna-docusate (Senokot-S) tablet 2 tablet  2 tablet Oral QHS Dibia, Landon BRAVO, MD       sodium chloride  flush (NS) 0.9 % injection 3 mL  3 mL Intravenous Q12H Smith, Rondell A, MD   3 mL at 09/29/24 0810   trimethobenzamide  (TIGAN ) injection 200 mg  200 mg Intramuscular Q6H PRN Claudene Reeves A, MD   200 mg at 09/27/24 1955    Allergies as of 09/26/2024   (No Known Allergies)    Social History   Socioeconomic History   Marital status: Married    Spouse name: Deland   Number of children: 2   Years of education: Not on file   Highest education level: Not on file  Occupational History    Occupation: retired    Associate Professor: [employer]  Tobacco Use   Smoking status: Former    Current packs/day: 0.00    Types: Cigarettes    Quit date: 2019    Years since quitting: 6.9   Smokeless tobacco: Never  Vaping Use   Vaping status: Never Used  Substance and Sexual Activity   Alcohol use: No    Comment: No alcohol for 17 years   Drug use: No   Sexual activity: Not on file  Other Topics Concern   Not on file  Social History Narrative   Married for 27 years with 3 children   Social Drivers of Corporate Investment Banker Strain: Low Risk  (11/18/2023)   Overall Financial Resource Strain (CARDIA)    Difficulty of Paying Living Expenses: Not hard at all  Food Insecurity: No Food Insecurity (09/26/2024)   Hunger Vital Sign    Worried About Running Out of Food in the Last Year: Never true    Ran Out of Food in the Last Year: Never true  Transportation Needs: No Transportation Needs (09/26/2024)   PRAPARE - Administrator, Civil Service (Medical): No    Lack of Transportation (Non-Medical): No  Physical Activity: Insufficiently Active (11/18/2023)   Exercise Vital Sign    Days of Exercise per Week: 3 days    Minutes of Exercise per Session: 10 min  Stress: No Stress Concern Present (11/18/2023)   [employer] of Occupational Health - Occupational Stress Questionnaire    Feeling of Stress : Not at all  Social Connections: Moderately Isolated (09/26/2024)   Social Connection and Isolation Panel    Frequency of Communication with Friends and Family: Once a week    Frequency of Social Gatherings with Friends and Family: Three times a week    Attends Religious Services: Never    Active Member of Clubs or Organizations: No    Attends Banker Meetings: Never    Marital Status: Married  Catering Manager Violence: Not At Risk (09/26/2024)   Humiliation, Afraid, Rape, and Kick questionnaire    Fear of Current or Ex-Partner: No    Emotionally Abused: No     Physically Abused: No    Sexually Abused: No     Code Status   Code Status: Full Code  Review of Systems: All systems reviewed and negative except where noted in HPI.  Physical Exam: Vital signs in last 24 hours: Temp:  [97.1 F (36.2 C)-99 F (37.2 C)] 98.7 F (37.1 C) (12/10 1238) Pulse Rate:  [80-109] 98 (12/10 1238) Resp:  [17-20] 19 (12/10 1238) BP: (74-132)/(38-72) 105/72 (12/10 1238) SpO2:  [93 %-98 %]  98 % (12/10 1238) Last BM Date : 09/27/24  General:  Pleasant male in NAD Psych:  Cooperative. Normal mood and affect Eyes: Pupils equal Ears:  Normal auditory acuity Nose: No deformity, discharge or lesions Neck:  Supple, no masses felt Lungs:  Clear to auscultation.  Heart:  Regular rate, regular rhythm.  Abdomen:  Soft, nondistended, nontender, active bowel sounds, no masses felt Rectal :  Deferred Msk: Symmetrical without gross deformities.  Neurologic:  Alert, oriented, grossly normal neurologically Extremities : No edema Skin:  Intact without significant lesions.    Intake/Output from previous day: 12/09 0701 - 12/10 0700 In: 1343.7 [Blood:344; IV Piggyback:999.7] Out: 950 [Urine:950] Intake/Output this shift:  Total I/O In: -  Out: 675 [Urine:675]   Vina Dasen, NP-C   09/29/2024, 1:40 PM

## 2024-09-29 NOTE — Progress Notes (Addendum)
 Consultation Progress Note   Patient: Gary Wright FMW:[id number] DOB: 06-13-1946 DOA: 09/26/2024 DOS: the patient was seen and examined on 09/29/2024 Primary service: DibiaLandon BRAVO, MD  Brief hospital course:  78 y.o. male with medical history significant of prior tobacco use, stage IV COPD, CAD s/p CABG, HTN, HLD,  diabetes mellitus, history of pulmonary embolism, chronic avascular necrosis of the left hip s/p  total hip arthroplasty on 11/7 presents with difficulty breathing. Chest x-ray showed no acute abnormality. Patient was bolused 1.5 L of normal saline IV fluids and given Levaquin  750 mg p.o. CT angiogram of the chest was obtained which noted stable pulmonary emboli. Patient was unable to ambulate more than a couple feet prior to tiring out with O2 saturations maintained.  Admitted for COPD exacerbation  Assessment and Plan:  Blood loss anemia Status post PRBC transfusion -In setting of eliquis  use, Hold eliquis  Hemoglobin dropped to 6.2,Transfused 1 Unit of PRBCs Hb stable today, 8.6 Check hemoccult, Low Iron level, will start IV iron  Hypotension -Received approx 2L of fluid due to soft BP Suspect dehydration as well as ongoing anemia -Holding BP meds Monitor BP closely  Acute respiratory distress COPD exacerbation Advanced COPD -Continue scheduled bronchodilators, discontinued Levaquin  due to prolonged QT. - ContinueIV steroids 40mg  q12 - Monitor ambulatory pulse ox daily and document.    Leukocytosis, likely due to steroid use  blood culture-Negative Low suspicion for infection   History of pulmonary embolism On anticoagulation Patient had been diagnosed with pulmonary emboli on 11/29 but had not started medication as of 12/1 when seen by primary care provider.  Since that time patient reports that he started Eliquis  and has been taking it as prescribed. -Held Eliquis  due to Blood loss anemia -Resume this evening and trend Hb   Prolonged QT  interval QTc 513. 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 - Hypoglycemia protocols - Hold glipizide  - CBGs before every meal with sensitive SSI. - Adjust regimen as deemed medically appropriate   Normocytic anemia Hemoglobin 12.2 which appears around patient's baseline.  - Continue to monitor   Essential hypertension Blood pressures Held due to low BP    Chronic Diastolic dysfunction Chronic.  Patient appears to be fairly euvolemic on physical exam.  Last echocardiogram noted EF to be 60 to 65% with grade 1 diastolic dysfunction when last checked 06/09/2024.   Depression - Continue Wellbutrin  and Celexa    Coronary artery disease Hyperlipidemia High-sensitivity troponins were negative x 2. - Continue atorvastatin     Deconditioning/debility Patient only able to walk a couple feet without becoming extremely winded needing to rest - PT to evaluate   DVT prophylaxis: Eliquis  Advance Care Planning:   Code Status: Full Code         TR[facility] will continue to follow the patient.  Subjective: Seen at bedside this morning, he states that he feels better this morning after blood transfusion yesterday. BP and HR stable.Hemoccult pending  Physical Exam: Vitals:   09/28/24 2030 09/29/24 0039 09/29/24 0408 09/29/24 0728  BP: (!) 100/51 132/60 107/68 122/61  Pulse: 92 96 80 81  Resp: 20 20 20 18   Temp: 98.6 F (37 C) 98.4 F (36.9 C) (!) 97.1 F (36.2 C) 98.5 F (36.9 C)  TempSrc: Oral Oral Oral Oral  SpO2: 94% 97% 97% 97%  Weight:  Height:       General  No acute Distress Eyes: PERRL, lids and conjunctivae normal ENMT: Mucous membranes are moist.   Neck: normal, supple, no masses, no thyromegaly Respiratory: End expiratory wheezing noted bilaterally, on supplemental oxygen Cardiovascular: Regular  rate and rhythm, no murmurs / rubs / gallops Abdomen: Soft, nontender nondistended. Musculoskeletal: no clubbing / cyanosis. No joint deformity upper and lower extremities.  Skin: no rashes, lesions, ulcers. No induration Neurologic: Facial asymmetry, moving extremity spontaneously, speech fluent. Psychiatric: Normal judgment and insight. Alert and oriented x 3. Normal mood.   Data Reviewed:    .CBC    Component Value Date/Time   WBC 17.3 (H) 09/29/2024 0904   RBC 2.76 (L) 09/29/2024 0904   HGB 8.6 (L) 09/29/2024 0904   HCT 26.3 (L) 09/29/2024 0904   PLT 313 09/29/2024 0904   MCV 95.3 09/29/2024 0904   MCH 31.2 09/29/2024 0904   MCHC 32.7 09/29/2024 0904   RDW 14.4 09/29/2024 0904   LYMPHSABS 1.0 09/28/2024 1057   MONOABS 0.8 09/28/2024 1057   EOSABS 0.0 09/28/2024 1057   BASOSABS 0.0 09/28/2024 1057   CMP     Component Value Date/Time   NA 138 09/28/2024 1057   NA 139 01/18/2021 1423   K 4.0 09/28/2024 1057   CL 106 09/28/2024 1057   CO2 27 09/28/2024 1057   GLUCOSE 93 09/28/2024 1057   BUN 24 (H) 09/28/2024 1057   BUN 14 01/18/2021 1423   CREATININE 2.12 (H) 09/28/2024 1057   CREATININE 0.87 07/01/2016 1032   CALCIUM  8.3 (L) 09/28/2024 1057   PROT 4.7 (L) 09/28/2024 1057   PROT 6.5 12/11/2022 1102   ALBUMIN 2.6 (L) 09/28/2024 1057   ALBUMIN 4.5 12/11/2022 1102   AST 41 09/28/2024 1057   ALT 28 09/28/2024 1057   ALKPHOS 41 09/28/2024 1057   BILITOT 0.7 09/28/2024 1057   BILITOT 0.4 12/11/2022 1102   GFR 59.29 (L) 09/09/2024 1129   EGFR 66 01/18/2021 1423   GFRNONAA 31 (L) 09/28/2024 1057      Time spent: 35 minutes.  Author: Landon FORBES Baller, MD 09/29/2024 10:13 AM  For on call review [email or web address].

## 2024-09-29 NOTE — H&P (View-Only) (Signed)
 Consultation Note   Referring Provider:   [facility] PCP: Lucius Krabbe, NP Primary Gastroenterologist:   None ETTER [name] PCP)      Reason for Consultation: GI bleed DOA: 09/26/2024         Hospital Day: 4   ASSESSMENT    78 year old male on Eliquis  for recently diagnosed pulmonary embolism following hip surgery early November.   Boron Anemia -acute drop in hgb / FOBT +  Hemoglobin was in the 11-12 range following hip replacement in November.  Now with hemoglobin of 6.2 after starting Eliquis  several days ago.  Surprisingly patient has not noticed any overt GI bleeding.  Rule out PUD, gastrointestinal malignancy.  ? Iron deficient, iron studies not straight forward. He did receive a dose of IV iron.   Nausea Unexplained weight loss Rule out PUD, gastrointestinal malignancy.  Could be medication related.  Weight loss could also be the result of prolonged illness / recent hospitalizations dating back to October  History of osteonecrosis of left hip, status post total hip arthroplasty on 08/27/24  Prolonged QT interval  COPD Asthma History of lung cancer              Hypertension  CAD status post CABG   CKD 3  See PMH for any additional medical history  / medical problems  Principal Problem:   COPD with acute exacerbation (HCC) Active Problems:   Depression   Essential hypertension   Mild CAD   Prolonged QT interval   Hyperlipidemia   Drug-induced diabetes mellitus   Leukocytosis   COPD exacerbation (HCC)   Acute respiratory distress   History of pulmonary embolism   Normocytic anemia   Diastolic dysfunction   Physical deconditioning    PLAN:   --Empiric famotidine  40 mg daily. Decided against PPI in light of prolonged QT interval --Trend H/H --Will need EGD and colonoscopy. Timing to be announced. The risks and benefits of EGD with possible biopsies and colonoscopy with possible biopsies and removal of  polyps were discussed with the patient who agrees to proceed.  HPI   Brief History:   Patient is a 78 year old male with multiple medical problems.  He was hospitalized last month with avascular necrosis in left hip and underwent a total left hip arthroplasty on 08/27/2024.  He presented to the ED on 09/18/2024 with wheezing.  Treated for COPD exacerbation but also found to have small PEs without right heart strain.  He was started on Eliquis .  He was released from the ED. and had follow-up appointment with his PCP a few days later  Interval History:   Patient came back to the ED on 09/26/2024 with shortness of breath and wheezing.  CTA showed stable PE .  Hemoglobin 12.2, stable compared to recent labs .  Patient was admitted for COPD exacerbation.  Over the last couple days patient hemoglobin has drastically declined to 6.2.  He is Hemoccult positive.  He denies overt GI blood loss.  Denies blood in urine..  No nosebleeds or other overt bleeding.  He has been having frequent bouts of nausea over the last several weeks but no vomiting.  No abdominal pain.  No bowel changes. He endorses an involuntary 20 pound weight loss over the  last 3 months due to poor appetite.   No NSAID use   Pertinent GI Studies   No prior history of colonoscopy   Labs and Imaging:  Recent Labs    09/28/24 1057 09/29/24 0904  PROT 4.7* 4.9*  ALBUMIN 2.6* 2.7*  AST 41 72*  ALT 28 87*  ALKPHOS 41 48  BILITOT 0.7 0.7   Recent Labs    09/27/24 0322 09/28/24 1057 09/28/24 1934 09/29/24 0904  WBC 15.4* 13.1*  --  17.3*  HGB 9.8* 6.2* 8.0* 8.6*  HCT 28.8* 18.8* 23.8* 26.3*  MCV 93.5 96.4  --  95.3  PLT 339 269  --  313   Recent Labs    09/27/24 0322 09/28/24 1057 09/29/24 0904  NA 140 138 139  K 3.4* 4.0 4.9  CL 104 106 108  CO2 22 27 21*  GLUCOSE 160* 93 193*  BUN 14 24* 21  CREATININE 1.06 2.12* 1.52*  CALCIUM  8.5* 8.3* 8.4*     CT Angio Chest PE W and/or Wo Contrast EXAM: CTA of the  Chest with contrast for PE 09/26/2024 10:37:58 AM  TECHNIQUE: CTA of the chest was performed after the administration of 75 mL of iohexol  (OMNIPAQUE ) 350 MG/ML injection. Multiplanar reformatted images are provided for review. MIP images are provided for review. Automated exposure control, iterative reconstruction, and/or weight based adjustment of the mA/kV was utilized to reduce the radiation dose to as low as reasonably achievable.  COMPARISON: Compared to a prior study.  CLINICAL HISTORY: Pulmonary embolism (PE) suspected, high prob.  FINDINGS:  PULMONARY ARTERIES: Pulmonary arteries are adequately opacified for evaluation. Segmental pulmonary embolus in the lateral and posterior basilar segments of the right lower lobe are similar to the prior study. Nonocclusive saddle embolus at the bifurcation of the right middle lobar pulmonary artery is stable. Segmental embolus in the basilar segmental artery of the left lower lobe is stable. Main pulmonary artery is normal in caliber.  MEDIASTINUM: The heart and pericardium demonstrate no acute abnormality. Coronary artery calcifications are present. Atherosclerotic calcifications are present at the aortic arch and great vessel origins. No acute abnormality of the thoracic aorta such as dissection, intramural hematoma, aortic ulceration, or rupture.  LYMPH NODES: No mediastinal, hilar or axillary lymphadenopathy.  LUNGS AND PLEURA: Centrilobular emphysematous changes are present. No focal consolidation or pulmonary edema. No pleural effusion or pneumothorax.  UPPER ABDOMEN: Limited images of the upper abdomen are unremarkable.  SOFT TISSUES AND BONES: A remote posterior left eighth nonunion rib fracture is again noted. No acute soft tissue abnormality.  IMPRESSION: 1. Stable pulmonary emboli, including segmental emboli in the lateral and posterior basilar segments of the right lower lobe, a nonocclusive saddle embolus at  the bifurcation of the right middle lobar pulmonary artery, and a segmental embolus in the basilar segmental artery of the left lower lobe.  Electronically signed by: Lonni Necessary MD 09/26/2024 10:57 AM EST RP Workstation: [id number] DG Chest Portable 1 View CLINICAL DATA:  Chest pain  EXAM: PORTABLE CHEST 1 VIEW  COMPARISON:  09/18/2024  FINDINGS: The lungs are clear without focal pneumonia, edema, pneumothorax or pleural effusion. The cardiopericardial silhouette is within normal limits for size. Telemetry leads overlie the chest.  IMPRESSION: No active disease.  Electronically Signed   By: Camellia Candle M.D.   On: 09/26/2024 07:31     Past Medical History:  Diagnosis Date   Acute dyspnea 03/24/2022   Acute exacerbation of chronic obstructive pulmonary disease (COPD) (HCC) 03/25/2022  Asthma    CAD (coronary artery disease) of artery bypass graft 06/13/2016   Chest pain    COPD (chronic obstructive pulmonary disease) (HCC)    COPD with acute exacerbation (HCC) 11/02/2020   Quit smokig 2019  - PFT's  121/22/23  FEV1 1.83 (60 % ) ratio 0.57  p 15 % improvement from saba p 0 prior to study with DLCO  16.4 (65%)   and FV curve mildly concave  - try off acei and on otc gerd rx 06/02/2023 >>>       Depression 09/01/2007   Qualifier: Diagnosis of  By: Nickola CMA, [place]     Elevated troponin 06/13/2016   History of lung cancer 11/02/2020   Hyperlipidemia 11/02/2020   Hypertension    Hypocalcemia 03/24/2022   Leukocytosis 08/05/2024   Prolonged QT interval 11/02/2020   SOB (shortness of breath) 06/12/2024   Stress-induced cardiomyopathy 06/13/2016   TOBACCO ABUSE 09/01/2007   Qualifier: Diagnosis of   By: Nickola CMA, [place]          Past Surgical History:  Procedure Laterality Date   BACK SURGERY     fusion in 1973   CARDIAC CATHETERIZATION N/A 06/13/2016   Procedure: Left Heart Cath and Coronary Angiography;  Surgeon: Lonni Hanson, MD;  Location: [facility]  INVASIVE CV LAB;  Service: Cardiovascular;  Laterality: N/A;   INGUINAL HERNIA REPAIR Right 04/21/2018   Procedure: RIGHT INGUINAL HERNIA REPAIR ERAS PATHWAY;  Surgeon: Vanderbilt Ned, MD;  Location: [facility] OR;  Service: General;  Laterality: Right;  TAP BLOCK   INSERTION OF MESH Right 04/21/2018   Procedure: INSERTION OF MESH;  Surgeon: Vanderbilt Ned, MD;  Location: [facility] OR;  Service: General;  Laterality: Right;   LUNG REMOVAL, PARTIAL Left    left upper lung removed about 6-7 years ago reported by pt   TONSILLECTOMY     TOTAL HIP ARTHROPLASTY Left 08/27/2024   Procedure: ARTHROPLASTY, HIP, TOTAL, ANTERIOR APPROACH;  Surgeon: Jerri Kay HERO, MD;  Location: [facility] OR;  Service: Orthopedics;  Laterality: Left;    Family History  Problem Relation Age of Onset   Hypertension Mother    Hypertension Father     Prior to Admission medications   Medication Sig Start Date End Date Taking? Authorizing Provider  acetaminophen  (TYLENOL ) 325 MG tablet Take 1-2 tablets (325-650 mg total) by mouth every 6 (six) hours as needed for mild pain (pain score 1-3) or fever (or temp > 100.5). 08/31/24  Yes Sheikh, Omair Latif, DO  Albuterol -Budesonide  (AIRSUPRA ) 90-80 MCG/ACT AERO Inhale 2 puffs into the lungs 4 (four) times daily as needed. 09/09/24  Yes Jesus Bernardino MATSU, MD  APIXABAN  (ELIQUIS ) VTE STARTER PACK (10MG  AND 5MG ) Take as directed on package: start with two-5mg  tablets twice daily for 7 days. On day 8, switch to one-5mg  tablet twice daily. Patient taking differently: 1 tab twice daily 09/18/24  Yes Kammerer, Megan L, DO  atorvastatin  (LIPITOR) 40 MG tablet TAKE 1 TABLET(40 MG) BY MOUTH DAILY AT 6 PM 08/18/23  Yes Crenshaw, Redell RAMAN, MD  buPROPion  (WELLBUTRIN  XL) 300 MG 24 hr tablet Take 300 mg by mouth daily. 05/11/16  Yes [provider]  citalopram  (CELEXA ) 40 MG tablet Take 40 mg by mouth daily. 06/01/16  Yes [provider]  Dupilumab  (DUPIXENT ) 300 MG/2ML SOAJ Inject 300 mg into the skin  every 14 (fourteen) days. 07/21/24  Yes [name] Dorn NOVAK, MD  Fluticasone -Umeclidin-Vilant (TRELEGY ELLIPTA ) 200-62.5-25 MCG/ACT AEPB Inhale 1 puff into the lungs every evening. 09/09/24  Yes Jesus Bernardino MATSU, MD  glipiZIDE  (GLUCOTROL  XL) 5 MG 24 hr tablet Take 1 tablet (5 mg total) by mouth 2 (two) times daily after a meal. 09/20/24  Yes Hudnell, Stephanie, NP  ipratropium-albuterol  (DUONEB) 0.5-2.5 (3) MG/3ML SOLN Take 3 mLs by nebulization every 6 (six) hours as needed. 09/09/24  Yes Jesus Bernardino MATSU, MD  metoprolol  succinate (TOPROL -XL) 25 MG 24 hr tablet Take 0.5 tablets (12.5 mg total) by mouth See admin instructions. Take 1/2 tablet (12.5mg ) by mouth once daily at noon. 08/06/24  Yes Arlon Carliss ORN, DO  olmesartan  (BENICAR ) 40 MG tablet Take 1 tablet (40 mg total) by mouth daily. 06/29/24 06/29/25 Yes Darlean Ozell NOVAK, MD  triamcinolone  ointment (KENALOG ) 0.5 % Apply topically 2 (two) times daily. Patient taking differently: Apply 1 Application topically 2 (two) times daily as needed (skin irritaiton). 06/24/24  Yes Lucius Krabbe, NP    Current Facility-Administered Medications  Medication Dose Route Frequency Provider Last Rate Last Admin   0.9 %  sodium chloride  infusion (Manually program via Guardrails IV Fluids)   Intravenous Once Dibia, Pauline E, MD       acetaminophen  (TYLENOL ) tablet 650 mg  650 mg Oral Q6H PRN Smith, Rondell A, MD   650 mg at 09/27/24 1053   Or   acetaminophen  (TYLENOL ) suppository 650 mg  650 mg Rectal Q6H PRN Smith, Rondell A, MD       albuterol  (PROVENTIL ) (2.5 MG/3ML) 0.083% nebulizer solution 2.5 mg  2.5 mg Nebulization Q2H PRN Smith, Rondell A, MD   2.5 mg at 09/28/24 0209   arformoterol  (BROVANA ) nebulizer solution 15 mcg  15 mcg Nebulization BID Smith, Rondell A, MD   15 mcg at 09/29/24 0841   atorvastatin  (LIPITOR) tablet 40 mg  40 mg Oral QPM Smith, Rondell A, MD   40 mg at 09/28/24 1717   budesonide  (PULMICORT ) nebulizer solution 0.5 mg  0.5 mg  Nebulization BID Smith, Rondell A, MD   0.5 mg at 09/29/24 9158   buPROPion  (WELLBUTRIN  XL) 24 hr tablet 300 mg  300 mg Oral Daily Smith, Rondell A, MD   300 mg at 09/29/24 0809   citalopram  (CELEXA ) tablet 20 mg  20 mg Oral Daily Dibia, Pauline E, MD   20 mg at 09/29/24 0809   Dupilumab  SOAJ 300 mg  300 mg Subcutaneous Q14 Days Pham, Minh Q, RPH-CPP       feeding supplement (ENSURE PLUS HIGH PROTEIN) liquid 237 mL  237 mL Oral BID BM Dibia, Pauline E, MD   237 mL at 09/29/24 9185   ferric gluconate (FERRLECIT ) 125 mg in sodium chloride  0.9 % 100 mL IVPB  125 mg Intravenous Daily Dibia, Pauline E, MD       HYDROcodone -acetaminophen  (NORCO/VICODIN) 5-325 MG per tablet 2 tablet  2 tablet Oral Q4H PRN Dibia, Pauline E, MD       insulin  aspart (novoLOG ) injection 0-5 Units  0-5 Units Subcutaneous QHS Smith, Rondell A, MD       insulin  aspart (novoLOG ) injection 0-9 Units  0-9 Units Subcutaneous TID WC Smith, Rondell A, MD   2 Units at 09/29/24 1258   irbesartan  (AVAPRO ) tablet 300 mg  300 mg Oral Daily Smith, Rondell A, MD   300 mg at 09/29/24 0809   LORazepam  (ATIVAN ) injection 0.5 mg  0.5 mg Intravenous Q6H PRN Dibia, Pauline E, MD   0.5 mg at 09/27/24 1555   methylPREDNISolone  sodium succinate (SOLU-MEDROL ) 40 mg/mL injection 40 mg  40 mg Intravenous Q12H  DibiaLandon BRAVO, MD   40 mg at 09/29/24 0809   senna-docusate (Senokot-S) tablet 2 tablet  2 tablet Oral QHS Dibia, Landon BRAVO, MD       sodium chloride  flush (NS) 0.9 % injection 3 mL  3 mL Intravenous Q12H Smith, Rondell A, MD   3 mL at 09/29/24 0810   trimethobenzamide  (TIGAN ) injection 200 mg  200 mg Intramuscular Q6H PRN Claudene Reeves A, MD   200 mg at 09/27/24 1955    Allergies as of 09/26/2024   (No Known Allergies)    Social History   Socioeconomic History   Marital status: Married    Spouse name: Deland   Number of children: 2   Years of education: Not on file   Highest education level: Not on file  Occupational History    Occupation: retired    Associate Professor: [employer]  Tobacco Use   Smoking status: Former    Current packs/day: 0.00    Types: Cigarettes    Quit date: 2019    Years since quitting: 6.9   Smokeless tobacco: Never  Vaping Use   Vaping status: Never Used  Substance and Sexual Activity   Alcohol use: No    Comment: No alcohol for 17 years   Drug use: No   Sexual activity: Not on file  Other Topics Concern   Not on file  Social History Narrative   Married for 27 years with 3 children   Social Drivers of Corporate Investment Banker Strain: Low Risk  (11/18/2023)   Overall Financial Resource Strain (CARDIA)    Difficulty of Paying Living Expenses: Not hard at all  Food Insecurity: No Food Insecurity (09/26/2024)   Hunger Vital Sign    Worried About Running Out of Food in the Last Year: Never true    Ran Out of Food in the Last Year: Never true  Transportation Needs: No Transportation Needs (09/26/2024)   PRAPARE - Administrator, Civil Service (Medical): No    Lack of Transportation (Non-Medical): No  Physical Activity: Insufficiently Active (11/18/2023)   Exercise Vital Sign    Days of Exercise per Week: 3 days    Minutes of Exercise per Session: 10 min  Stress: No Stress Concern Present (11/18/2023)   [employer] of Occupational Health - Occupational Stress Questionnaire    Feeling of Stress : Not at all  Social Connections: Moderately Isolated (09/26/2024)   Social Connection and Isolation Panel    Frequency of Communication with Friends and Family: Once a week    Frequency of Social Gatherings with Friends and Family: Three times a week    Attends Religious Services: Never    Active Member of Clubs or Organizations: No    Attends Banker Meetings: Never    Marital Status: Married  Catering Manager Violence: Not At Risk (09/26/2024)   Humiliation, Afraid, Rape, and Kick questionnaire    Fear of Current or Ex-Partner: No    Emotionally Abused: No     Physically Abused: No    Sexually Abused: No     Code Status   Code Status: Full Code  Review of Systems: All systems reviewed and negative except where noted in HPI.  Physical Exam: Vital signs in last 24 hours: Temp:  [97.1 F (36.2 C)-99 F (37.2 C)] 98.7 F (37.1 C) (12/10 1238) Pulse Rate:  [80-109] 98 (12/10 1238) Resp:  [17-20] 19 (12/10 1238) BP: (74-132)/(38-72) 105/72 (12/10 1238) SpO2:  [93 %-98 %]  98 % (12/10 1238) Last BM Date : 09/27/24  General:  Pleasant male in NAD Psych:  Cooperative. Normal mood and affect Eyes: Pupils equal Ears:  Normal auditory acuity Nose: No deformity, discharge or lesions Neck:  Supple, no masses felt Lungs:  Clear to auscultation.  Heart:  Regular rate, regular rhythm.  Abdomen:  Soft, nondistended, nontender, active bowel sounds, no masses felt Rectal :  Deferred Msk: Symmetrical without gross deformities.  Neurologic:  Alert, oriented, grossly normal neurologically Extremities : No edema Skin:  Intact without significant lesions.    Intake/Output from previous day: 12/09 0701 - 12/10 0700 In: 1343.7 [Blood:344; IV Piggyback:999.7] Out: 950 [Urine:950] Intake/Output this shift:  Total I/O In: -  Out: 675 [Urine:675]   Vina Dasen, NP-C   09/29/2024, 1:40 PM

## 2024-09-29 NOTE — Progress Notes (Signed)
 Ok to resume home supply Dupixent  per Dr. Carlota (300mg  SQ q14d)  Sergio Batch, PharmD, BCIDP, AAHIVP, CPP Infectious Disease Pharmacist 09/29/2024 12:35 PM

## 2024-09-30 ENCOUNTER — Ambulatory Visit (HOSPITAL_COMMUNITY)

## 2024-09-30 ENCOUNTER — Encounter (HOSPITAL_COMMUNITY): Admission: EM | Disposition: A | Payer: Self-pay | Source: Home / Self Care | Attending: Internal Medicine

## 2024-09-30 ENCOUNTER — Inpatient Hospital Stay (HOSPITAL_COMMUNITY): Admitting: Certified Registered Nurse Anesthetist

## 2024-09-30 ENCOUNTER — Ambulatory Visit: Admitting: Pulmonary Disease

## 2024-09-30 ENCOUNTER — Encounter (HOSPITAL_COMMUNITY): Payer: Self-pay | Admitting: Internal Medicine

## 2024-09-30 DIAGNOSIS — D127 Benign neoplasm of rectosigmoid junction: Secondary | ICD-10-CM

## 2024-09-30 DIAGNOSIS — K31819 Angiodysplasia of stomach and duodenum without bleeding: Secondary | ICD-10-CM

## 2024-09-30 DIAGNOSIS — K209 Esophagitis, unspecified without bleeding: Secondary | ICD-10-CM

## 2024-09-30 DIAGNOSIS — K648 Other hemorrhoids: Secondary | ICD-10-CM

## 2024-09-30 DIAGNOSIS — D120 Benign neoplasm of cecum: Secondary | ICD-10-CM

## 2024-09-30 DIAGNOSIS — K5520 Angiodysplasia of colon without hemorrhage: Secondary | ICD-10-CM

## 2024-09-30 DIAGNOSIS — D126 Benign neoplasm of colon, unspecified: Secondary | ICD-10-CM

## 2024-09-30 HISTORY — PX: HEMOSTASIS CLIP PLACEMENT: SHX6857

## 2024-09-30 HISTORY — PX: ESOPHAGOGASTRODUODENOSCOPY: SHX5428

## 2024-09-30 HISTORY — PX: HOT HEMOSTASIS: SHX5433

## 2024-09-30 HISTORY — PX: COLONOSCOPY: SHX5424

## 2024-09-30 HISTORY — PX: POLYPECTOMY: SHX149

## 2024-09-30 LAB — COMPREHENSIVE METABOLIC PANEL WITH GFR
ALT: 95 U/L — ABNORMAL HIGH (ref 0–44)
AST: 47 U/L — ABNORMAL HIGH (ref 15–41)
Albumin: 2.9 g/dL — ABNORMAL LOW (ref 3.5–5.0)
Alkaline Phosphatase: 60 U/L (ref 38–126)
Anion gap: 8 (ref 5–15)
BUN: 30 mg/dL — ABNORMAL HIGH (ref 8–23)
CO2: 25 mmol/L (ref 22–32)
Calcium: 8.3 mg/dL — ABNORMAL LOW (ref 8.9–10.3)
Chloride: 104 mmol/L (ref 98–111)
Creatinine, Ser: 1.4 mg/dL — ABNORMAL HIGH (ref 0.61–1.24)
GFR, Estimated: 51 mL/min — ABNORMAL LOW (ref >60)
Glucose, Bld: 106 mg/dL — ABNORMAL HIGH (ref 70–99)
Potassium: 3.8 mmol/L (ref 3.5–5.1)
Sodium: 137 mmol/L (ref 135–145)
Total Bilirubin: 0.7 mg/dL (ref 0.0–1.2)
Total Protein: 5.3 g/dL — ABNORMAL LOW (ref 6.5–8.1)

## 2024-09-30 LAB — GLUCOSE, CAPILLARY
Glucose-Capillary: 109 mg/dL — ABNORMAL HIGH (ref 70–99)
Glucose-Capillary: 165 mg/dL — ABNORMAL HIGH (ref 70–99)
Glucose-Capillary: 170 mg/dL — ABNORMAL HIGH (ref 70–99)
Glucose-Capillary: 77 mg/dL (ref 70–99)
Glucose-Capillary: 80 mg/dL (ref 70–99)

## 2024-09-30 LAB — CBC
HCT: 25.8 % — ABNORMAL LOW (ref 39.0–52.0)
Hemoglobin: 8.4 g/dL — ABNORMAL LOW (ref 13.0–17.0)
MCH: 31.8 pg (ref 26.0–34.0)
MCHC: 32.6 g/dL (ref 30.0–36.0)
MCV: 97.7 fL (ref 80.0–100.0)
Platelets: 360 K/uL (ref 150–400)
RBC: 2.64 MIL/uL — ABNORMAL LOW (ref 4.22–5.81)
RDW: 14.5 % (ref 11.5–15.5)
WBC: 18.7 K/uL — ABNORMAL HIGH (ref 4.0–10.5)
nRBC: 0.1 % (ref 0.0–0.2)

## 2024-09-30 SURGERY — COLONOSCOPY
Anesthesia: Monitor Anesthesia Care

## 2024-09-30 MED ORDER — PROPOFOL 10 MG/ML IV BOLUS
INTRAVENOUS | Status: DC | PRN
  Administered 2024-09-30 (×3): 20 mg via INTRAVENOUS

## 2024-09-30 MED ORDER — PROPOFOL 500 MG/50ML IV EMUL
INTRAVENOUS | Status: DC | PRN
  Administered 2024-09-30: 125 ug/kg/min via INTRAVENOUS

## 2024-09-30 MED ORDER — ALBUMIN HUMAN 5 % IV SOLN: INTRAVENOUS | Status: DC | PRN

## 2024-09-30 MED ORDER — PREDNISONE 20 MG PO TABS
40.0000 mg | ORAL_TABLET | Freq: Every day | ORAL | Status: DC
  Administered 2024-10-01: 40 mg via ORAL
  Filled 2024-09-30: qty 2

## 2024-09-30 MED ORDER — PHENYLEPHRINE 80 MCG/ML (10ML) SYRINGE FOR IV PUSH (FOR BLOOD PRESSURE SUPPORT)
PREFILLED_SYRINGE | INTRAVENOUS | Status: DC | PRN
  Administered 2024-09-30 (×3): 160 ug via INTRAVENOUS
  Administered 2024-09-30 (×2): 80 ug via INTRAVENOUS

## 2024-09-30 MED ORDER — EPHEDRINE SULFATE-NACL 50-0.9 MG/10ML-% IV SOSY
PREFILLED_SYRINGE | INTRAVENOUS | Status: DC | PRN
  Administered 2024-09-30 (×3): 5 mg via INTRAVENOUS

## 2024-09-30 MED ADMIN — Pantoprazole Sodium EC Tab 40 MG (Base Equiv): 40 mg | ORAL | NDC 65862056099

## 2024-09-30 MED FILL — Pantoprazole Sodium EC Tab 40 MG (Base Equiv): 40.0000 mg | ORAL | Qty: 1 | Status: AC

## 2024-09-30 NOTE — Anesthesia Preprocedure Evaluation (Addendum)
 Anesthesia Evaluation  Patient identified by MRN, date of birth, ID band Patient awake    Reviewed: Allergy & Precautions, NPO status , Patient's Chart, lab work & pertinent test results  Airway Mallampati: II  TM Distance: >3 FB Neck ROM: Full    Dental  (+) Edentulous Upper, Edentulous Lower   Pulmonary asthma , COPD, former smoker   Pulmonary exam normal        Cardiovascular hypertension, + CAD   Rhythm:Regular Rate:Normal     Neuro/Psych    Depression    negative neurological ROS     GI/Hepatic   Endo/Other  diabetes    Renal/GU   negative genitourinary   Musculoskeletal   Abdominal Normal abdominal exam  (+)   Peds  Hematology  (+) Blood dyscrasia, anemia   Anesthesia Other Findings   Reproductive/Obstetrics                              Anesthesia Physical Anesthesia Plan  ASA: 3  Anesthesia Plan: MAC   Post-op Pain Management:    Induction: Intravenous  PONV Risk Score and Plan: 1 and Propofol  infusion and Treatment may vary due to age or medical condition  Airway Management Planned: Simple Face Mask and Nasal Cannula  Additional Equipment: None  Intra-op Plan:   Post-operative Plan:   Informed Consent: I have reviewed the patients History and Physical, chart, labs and discussed the procedure including the risks, benefits and alternatives for the proposed anesthesia with the patient or authorized representative who has indicated his/her understanding and acceptance.     Dental advisory given  Plan Discussed with: CRNA  Anesthesia Plan Comments:         Anesthesia Quick Evaluation

## 2024-09-30 NOTE — Progress Notes (Signed)
 Pt to endo at this time via wheelchair. Tele removed and notified. Pt A/Ox4 and on room. Pt saline locked.

## 2024-09-30 NOTE — Progress Notes (Addendum)
 Consultation Progress Note   Patient: Gary Wright FMW:[id number] DOB: Dec 07, 1945 DOA: 09/26/2024 DOS: the patient was seen and examined on 09/30/2024 Primary service: DibiaLandon BRAVO, MD  Brief hospital course:  78 y.o. male with medical history significant of prior tobacco use, stage IV COPD, CAD s/p CABG, HTN, HLD,  diabetes mellitus, history of pulmonary embolism, chronic avascular necrosis of the left hip s/p  total hip arthroplasty on 11/7 presents with difficulty breathing. Chest x-ray showed no acute abnormality. Patient was bolused 1.5 L of normal saline IV fluids and given Levaquin  750 mg p.o. CT angiogram of the chest was obtained which noted stable pulmonary emboli. Patient was unable to ambulate more than a couple feet prior to tiring out with O2 saturations maintained.  Admitted for COPD exacerbation  Assessment and Plan:  Blood loss anemia Status post PRBC transfusion -In setting of eliquis  use, Hold eliquis  12/09-Hemoglobin dropped to 6.2,Transfused 1 Unit of PRBCs Positive hemoccult, Low Iron level, started Iron IV  Discussed with GI, he is scheduled for EGD colonoscopy 12/11. Follow-up results.  Hypotension-resolved -Received approx 2L of IVF in the past 2 days of fluid due to soft BP Suspect dehydration as well as ongoing anemia -Holding BP meds Monitor BP closely  Acute respiratory distress COPD exacerbation Advanced COPD Clinically improved, breathing better -Continue scheduled bronchodilators, discontinued Levaquin  due to prolonged QT. - Managed with IV steroids 40mg  q12 now weaned to 40 mg of prednisone  po - Monitor ambulatory pulse ox daily and document.    Leukocytosis, likely due to steroid use  blood culture-Negative Low suspicion for infection   History of pulmonary embolism On anticoagulation Patient had been diagnosed with pulmonary emboli on 11/29 but had not started medication as of 12/1 when seen by primary care provider.  Since that  time patient reports that he started Eliquis  and has been taking it as prescribed. -Held Eliquis  due to Blood loss anemia    Prolonged QT interval QTc 513. 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 - Hypoglycemia protocols - Hold glipizide  - CBGs before every meal with sensitive SSI. - Adjust regimen as deemed medically appropriate   Normocytic anemia Hemoglobin drifted from 12.2-8  -Low iron, GI consulted -Plan for EGD/colonoscopy    Essential hypertension Blood pressures Held due to low BP    Chronic Diastolic dysfunction Chronic.  Patient appears to be fairly euvolemic on physical exam.  Last echocardiogram noted EF to be 60 to 65% with grade 1 diastolic dysfunction when last checked 06/09/2024.   Depression - Continue Wellbutrin  and Celexa    Coronary artery disease Hyperlipidemia High-sensitivity troponins were negative x 2. - Continue atorvastatin     Deconditioning/debility Recent Left Hip Surgery Patient only able to walk a couple feet without becoming extremely winded needing to rest - PT following   DVT prophylaxis: Eliquis  Advance Care Planning:   Code Status: Full Code         [facility] will continue to follow the patient.  Subjective: Seen at bedside this morning, patient complains of left leg pain has been ongoing.  He is scheduled for EGD/colonoscopy today.  Pending  Physical Exam: Vitals:   09/29/24 2048 09/29/24 2342 09/30/24 0449 09/30/24 0816  BP: 120/65 (!) 124/55 121/68   Pulse: 97 93 (!) 104 100  Resp: 17 18 18 18   Temp: 97.9 F (36.6 C) 97.9 F (36.6 C)  97.9 F (36.6 C)   TempSrc:      SpO2: 100% 100% 94%   Weight:      Height:       General  No acute Distress Eyes: PERRL, lids and conjunctivae normal ENMT: Mucous membranes are moist.   Neck: normal, supple, no masses, no  thyromegaly Respiratory: End expiratory wheezing noted bilaterally, on supplemental oxygen Cardiovascular: Regular rate and rhythm, no murmurs / rubs / gallops Abdomen: Soft, nontender nondistended. Musculoskeletal: no clubbing / cyanosis. No joint deformity upper and lower extremities. Left leg swelling. Skin: no rashes, lesions, ulcers. No induration Neurologic: Facial asymmetry, moving extremity spontaneously, speech fluent. Psychiatric: Normal judgment and insight. Alert and oriented x 3. Normal mood.   Data Reviewed:    .CBC    Component Value Date/Time   WBC 18.7 (H) 09/30/2024 0423   RBC 2.64 (L) 09/30/2024 0423   HGB 8.4 (L) 09/30/2024 0423   HCT 25.8 (L) 09/30/2024 0423   PLT 360 09/30/2024 0423   MCV 97.7 09/30/2024 0423   MCH 31.8 09/30/2024 0423   MCHC 32.6 09/30/2024 0423   RDW 14.5 09/30/2024 0423   LYMPHSABS 1.0 09/28/2024 1057   MONOABS 0.8 09/28/2024 1057   EOSABS 0.0 09/28/2024 1057   BASOSABS 0.0 09/28/2024 1057   CMP     Component Value Date/Time   NA 137 09/30/2024 0423   NA 139 01/18/2021 1423   K 3.8 09/30/2024 0423   CL 104 09/30/2024 0423   CO2 25 09/30/2024 0423   GLUCOSE 106 (H) 09/30/2024 0423   BUN 30 (H) 09/30/2024 0423   BUN 14 01/18/2021 1423   CREATININE 1.40 (H) 09/30/2024 0423   CREATININE 0.87 07/01/2016 1032   CALCIUM  8.3 (L) 09/30/2024 0423   PROT 5.3 (L) 09/30/2024 0423   PROT 6.5 12/11/2022 1102   ALBUMIN 2.9 (L) 09/30/2024 0423   ALBUMIN 4.5 12/11/2022 1102   AST 47 (H) 09/30/2024 0423   ALT 95 (H) 09/30/2024 0423   ALKPHOS 60 09/30/2024 0423   BILITOT 0.7 09/30/2024 0423   BILITOT 0.4 12/11/2022 1102   GFR 59.29 (L) 09/09/2024 1129   EGFR 66 01/18/2021 1423   GFRNONAA 51 (L) 09/30/2024 0423      Time spent: 35 minutes.  Author: Landon FORBES Baller, MD 09/30/2024 9:11 AM  For on call review [email or web address].

## 2024-09-30 NOTE — Anesthesia Postprocedure Evaluation (Signed)
 Anesthesia Post Note  Patient: Gary Wright  Procedure(s) Performed: COLONOSCOPY EGD (ESOPHAGOGASTRODUODENOSCOPY) EGD, WITH ARGON PLASMA COAGULATION CONTROL OF HEMORRHAGE, GI TRACT, ENDOSCOPIC, BY CLIPPING OR OVERSEWING POLYPECTOMY, INTESTINE     Patient location during evaluation: Endoscopy Anesthesia Type: MAC Level of consciousness: awake and alert Pain management: pain level controlled Vital Signs Assessment: post-procedure vital signs reviewed and stable Respiratory status: spontaneous breathing, nonlabored ventilation, respiratory function stable and patient connected to nasal cannula oxygen Cardiovascular status: blood pressure returned to baseline and stable Postop Assessment: no apparent nausea or vomiting Anesthetic complications: no   No notable events documented.  Last Vitals:  Vitals:   09/30/24 1340 09/30/24 1356  BP: (!) 113/56 (!) 115/50  Pulse: 96 97  Resp: 18 18  Temp:  36.6 C  SpO2: 100% 98%    Last Pain:  Vitals:   09/30/24 1356  TempSrc:   PainSc: 9                  Rome Ade

## 2024-09-30 NOTE — Op Note (Signed)
 [facility] Patient Name: Gary Wright Procedure Date : 09/30/2024 MRN: [id number] Attending MD: Elspeth SQUIBB. Leigh , MD, [id number] Date of Birth: 08-Apr-1946 CSN: [id number] Age: 78 Admit Type: Inpatient Procedure:                Upper GI endoscopy Indications:              Heme positive stool, anemia, anticoagulated -                            Eliquis  Providers:                Elspeth SQUIBB. Leigh, MD, Darleene Bare, RN, Randall Lines, RN, Curtistine Bishop, Technician Referring MD:              Medicines:                Monitored Anesthesia Care Complications:            No immediate complications. Estimated blood loss:                            Minimal. Estimated Blood Loss:     Estimated blood loss was minimal. Procedure:                Pre-Anesthesia Assessment:                           - Prior to the procedure, a History and Physical                            was performed, and patient medications and                            allergies were reviewed. The patient's tolerance of                            previous anesthesia was also reviewed. The risks                            and benefits of the procedure and the sedation                            options and risks were discussed with the patient.                            All questions were answered, and informed consent                            was obtained. Prior Anticoagulants: The patient has                            taken Eliquis  (apixaban ), last dose was 2 days  prior to procedure. ASA Grade Assessment: III - A                            patient with severe systemic disease. After                            reviewing the risks and benefits, the patient was                            deemed in satisfactory condition to undergo the                            procedure.                           After obtaining informed consent, the endoscope  was                            passed under direct vision. Throughout the                            procedure, the patient's blood pressure, pulse, and                            oxygen saturations were monitored continuously. The                            [id number]-[id number] ([id number]) Olympus endoscope was introduced                            through the mouth, and advanced to the second part                            of duodenum. The upper GI endoscopy was                            accomplished without difficulty. The patient                            tolerated the procedure well. Scope In: Scope Out: Findings:      Esophagogastric landmarks were identified: the Z-line was found at 40       cm, the gastroesophageal junction was found at 40 cm and the upper       extent of the gastric folds was found at 43 cm from the incisors.      A 3 cm hiatal hernia was present.      LA Grade C (one or more mucosal breaks continuous between tops of 2 or       more mucosal folds, less than 75% circumference) esophagitis was found       in the distal esophagus.      The exam of the esophagus was otherwise normal.      The entire examined stomach was normal.      A single small angiodysplastic lesion without bleeding was found in the       second  portion of the duodenum. Fulguration to ablate the lesion to       prevent bleeding by argon plasma was successful. With initial therapy       this induced bleeding, a few applications were made to control bleeding.       To prevent bleeding post-intervention, one hemostatic clip was       successfully placed.      The exam of the duodenum was otherwise normal. Impression:               - Esophagogastric landmarks identified.                           - 3 cm hiatal hernia.                           - LA Grade C esophagitis.                           - Normal stomach.                           - A single non-bleeding angiodysplastic lesion in                             the duodenum. Treated with argon plasma coagulation                            (APC). Clip was placed.                           Esophagitis +/- component of anemia could be                            related to anemia in the setting of anticoagulation. Recommendation:           - Proceed with colonoscopy - full recommendations                            per colonoscopy note. Procedure Code(s):        --- Professional ---                           [phone], Esophagogastroduodenoscopy, flexible,                            transoral; with control of bleeding, any method Diagnosis Code(s):        --- Professional ---                           K44.9, Diaphragmatic hernia without obstruction or                            gangrene                           K20.90, Esophagitis, unspecified without bleeding  K31.819, Angiodysplasia of stomach and duodenum                            without bleeding                           R19.5, Other fecal abnormalities CPT copyright 2022 American [employer] Association. All rights reserved. The codes documented in this report are preliminary and upon coder review may  be revised to meet current compliance requirements. Elspeth P. Kassius Battiste, MD 09/30/2024 12:50:39 PM This report has been signed electronically. Number of Addenda: 0

## 2024-09-30 NOTE — Transfer of Care (Signed)
 Immediate Anesthesia Transfer of Care Note  Patient: Gary Wright  Procedure(s) Performed: COLONOSCOPY EGD (ESOPHAGOGASTRODUODENOSCOPY) EGD, WITH ARGON PLASMA COAGULATION CONTROL OF HEMORRHAGE, GI TRACT, ENDOSCOPIC, BY CLIPPING OR OVERSEWING POLYPECTOMY, INTESTINE  Patient Location: Endoscopy Unit  Anesthesia Type:MAC  Level of Consciousness: drowsy and patient cooperative  Airway & Oxygen Therapy: Patient Spontanous Breathing and Patient connected to nasal cannula oxygen  Post-op Assessment: Report given to RN, Post -op Vital signs reviewed and stable, and Patient moving all extremities X 4  Post vital signs: Reviewed and stable  Last Vitals:  Vitals Value Taken Time  BP 94/66   Temp    Pulse 97   Resp 14 09/30/24 13:19  SpO2 94   Vitals shown include unfiled device data.  Last Pain:  Vitals:   09/30/24 1107  TempSrc: Oral  PainSc: 8       Patients Stated Pain Goal: 1 (09/30/24 1107)  Complications: No notable events documented.

## 2024-09-30 NOTE — Op Note (Signed)
 [facility] Patient Name: Gary Wright Procedure Date : 09/30/2024 MRN: [id number] Attending MD: Elspeth SQUIBB. Leigh , MD, [id number] Date of Birth: [birth year] CSN: [id number] Age: 78 Admit Type: Outpatient Procedure:                Colonoscopy Indications:              Heme positive stool, worsening anemia while on                            Eliquis  - first colonoscopy Providers:                Elspeth SQUIBB. Leigh, MD, Darleene Bare, RN, Curtistine Bishop, Technician Referring MD:              Medicines:                Monitored Anesthesia Care Complications:            No immediate complications. Estimated blood loss:                            Minimal. Estimated Blood Loss:     Estimated blood loss was minimal. Procedure:                Pre-Anesthesia Assessment:                           - Prior to the procedure, a History and Physical                            was performed, and patient medications and                            allergies were reviewed. The patient's tolerance of                            previous anesthesia was also reviewed. The risks                            and benefits of the procedure and the sedation                            options and risks were discussed with the patient.                            All questions were answered, and informed consent                            was obtained. Prior Anticoagulants: The patient has                            taken Eliquis  (apixaban ), last dose was 2 days  prior to procedure. ASA Grade Assessment: III - A                            patient with severe systemic disease. After                            reviewing the risks and benefits, the patient was                            deemed in satisfactory condition to undergo the                            procedure.                           After obtaining informed consent, the colonoscope                             was passed under direct vision. Throughout the                            procedure, the patient's blood pressure, pulse, and                            oxygen saturations were monitored continuously. The                            [id number]-[id number] ([id number]) Olympus colonoscope was                            introduced through the anus and advanced to the the                            cecum, identified by appendiceal orifice and                            ileocecal valve. The colonoscopy was performed                            without difficulty. The patient tolerated the                            procedure well. The quality of the bowel                            preparation was fair. The ileocecal valve,                            appendiceal orifice, and rectum were photographed. Scope In: 12:52:26 PM Scope Out: 1:11:16 PM Scope Withdrawal Time: 0 hours 13 minutes 42 seconds  Total Procedure Duration: 0 hours 18 minutes 50 seconds  Findings:      The perianal and digital rectal examinations were normal.      A 3 mm polyp was found in the cecum. The polyp was sessile.  The polyp       was removed with a cold snare. Resection and retrieval were complete.      A 10 mm polyp was found in the recto-sigmoid colon. The polyp was       pedunculated. The polyp was removed with a hot snare. Resection and       retrieval were complete. To prevent bleeding after the polypectomy,       given recent anemia and plans to resume anticoagulation in the near       future, one hemostatic clip was successfully placed.      A moderate amount of retained vegetable matter was found in the entire       colon, making visualization difficult in dependant portions of the       colon. Lavage of the areas was performed using copious amounts of       sterile water , resulting in clearance with fair visualization. Most       effected was right colon and splenic flexure / left colon. No obvious        pathology, polyps, or mass lesions noted to cause anemia in these areas,       however small or flat lesions may not have been appreciated.      Internal hemorrhoids were found during retroflexion.      The exam was otherwise without abnormality. Impression:               - Preparation of the colon was fair as outlined.                           - One 3 mm polyp in the cecum, removed with a cold                            snare. Resected and retrieved.                           - One 10 mm polyp at the recto-sigmoid colon,                            removed with a hot snare. Resected and retrieved.                            Clip was placed.                           - Internal hemorrhoids.                           - The examination was otherwise normal.                           No cause for anemia on colonoscopy. Suspect it may                            be due to esophagitis +/- small bowel AVM noted on                            EGD. Recommendation:           -  Return patient to hospital ward for ongoing care.                           - Advance diet as tolerated.                           - Continue present medications.                           - Start protonix  40mg  twice daily for 6 weeks, then                            once daily thereafter                           - Resume Eliquis  tomorrow.                           - Await pathology results.                           - Trend Hgb and monitor for bleeding                           - Consideration for repeat EGD and colonoscopy as                            outpatient (rule out Barrett's after healing of                            esophagitis / fair prep on colonoscopy)                           - We will sign off for now, call with questions in                            the interim Procedure Code(s):        --- Professional ---                           [phone], Colonoscopy, flexible; with removal of                             tumor(s), polyp(s), or other lesion(s) by snare                            technique Diagnosis Code(s):        --- Professional ---                           X35.1, Other hemorrhoids                           D12.0, Benign neoplasm of cecum  D12.7, Benign neoplasm of rectosigmoid junction                           R19.5, Other fecal abnormalities CPT copyright 2022 American Medical Association. All rights reserved. The codes documented in this report are preliminary and upon coder review may  be revised to meet current compliance requirements. Elspeth P. Corey Caulfield, MD 09/30/2024 1:20:21 PM This report has been signed electronically. Number of Addenda: 0

## 2024-09-30 NOTE — Interval H&P Note (Signed)
 History and Physical Interval Note: Here for EGD and colonoscopy to evaluate anemia, with heme positive stool, while on Eliquis . Last took Eliquis  2 days ago. No prior EGD or colonoscopy. Poor appetite and weight loss noted. EGD and colonoscopy to evaluate. I have discussed risks / benefits of the exams and anesthesia and he wants to proceed. All questions answered. He reports prep went well.   09/30/2024 12:04 PM  Gary Wright  has presented today for surgery, with the diagnosis of Anemia and hemoccult positive stools.  The various methods of treatment have been discussed with the patient and family. After consideration of risks, benefits and other options for treatment, the patient has consented to  Procedures: COLONOSCOPY (N/A) EGD (ESOPHAGOGASTRODUODENOSCOPY) (N/A) as a surgical intervention.  The patient's history has been reviewed, patient examined, no change in status, stable for surgery.  I have reviewed the patient's chart and labs.  Questions were answered to the patient's satisfaction.     Elspeth P Daelan Gatt

## 2024-09-30 NOTE — Plan of Care (Signed)

## 2024-10-01 ENCOUNTER — Ambulatory Visit: Payer: Self-pay | Admitting: Gastroenterology

## 2024-10-01 DIAGNOSIS — D649 Anemia, unspecified: Secondary | ICD-10-CM

## 2024-10-01 LAB — SURGICAL PATHOLOGY

## 2024-10-01 LAB — GLUCOSE, CAPILLARY
Glucose-Capillary: 64 mg/dL — ABNORMAL LOW (ref 70–99)
Glucose-Capillary: 77 mg/dL (ref 70–99)
Glucose-Capillary: 94 mg/dL (ref 70–99)
Glucose-Capillary: 118 mg/dL — ABNORMAL HIGH (ref 70–99)
Glucose-Capillary: 267 mg/dL — ABNORMAL HIGH (ref 70–99)
Glucose-Capillary: 259 mg/dL — ABNORMAL HIGH (ref 70–99)
Glucose-Capillary: 267 mg/dL — ABNORMAL HIGH (ref 70–99)

## 2024-10-01 LAB — CULTURE, BLOOD (SINGLE)
Culture: NO GROWTH
Special Requests: ADEQUATE

## 2024-10-01 MED ORDER — IRBESARTAN 150 MG PO TABS
150.0000 mg | ORAL_TABLET | Freq: Every day | ORAL | Status: DC
  Filled 2024-10-01: qty 1

## 2024-10-01 MED ORDER — BUDESON-GLYCOPYRROL-FORMOTEROL 160-9-4.8 MCG/ACT IN AERO
2.0000 | INHALATION_SPRAY | Freq: Two times a day (BID) | RESPIRATORY_TRACT | Status: DC
  Administered 2024-10-01 – 2024-10-02 (×2): 2 via RESPIRATORY_TRACT
  Filled 2024-10-01: qty 5.9

## 2024-10-01 MED ORDER — METOPROLOL SUCCINATE ER 25 MG PO TB24
12.5000 mg | ORAL_TABLET | Freq: Every day | ORAL | Status: DC
  Administered 2024-10-01: 12.5 mg via ORAL
  Filled 2024-10-01: qty 1

## 2024-10-01 MED ORDER — APIXABAN 5 MG PO TABS
5.0000 mg | ORAL_TABLET | Freq: Two times a day (BID) | ORAL | Status: DC
  Administered 2024-10-01 – 2024-10-02 (×3): 5 mg via ORAL
  Filled 2024-10-01 (×3): qty 1

## 2024-10-01 MED ADMIN — Pantoprazole Sodium EC Tab 40 MG (Base Equiv): 40 mg | ORAL | NDC 65862056099

## 2024-10-01 MED ADMIN — Acetaminophen Tab 500 MG: 1000 mg | ORAL | NDC 50580045711

## 2024-10-01 MED FILL — Acetaminophen Tab 500 MG: 1000.0000 mg | ORAL | Qty: 2 | Status: AC

## 2024-10-01 NOTE — Progress Notes (Signed)
 Patient is scheduled with Dr. Leigh for 12/01/24 @ 3pm

## 2024-10-01 NOTE — Plan of Care (Signed)

## 2024-10-01 NOTE — Progress Notes (Signed)
 [facility]   ROUNDING   NOTE Rithwik Schmieg FMW:[id number]  DOB: August 27, 1946  DOA: 09/26/2024  PCP: Lucius Krabbe, NP  10/01/2024,6:08 AM  LOS: 4 days    Code Status: Full code     from: Home   78 year old Stage IV COPD prior tobacco Left upper lobe mass status post resection 10/30/2013 robotic assisted wedge resection with lymph node dissection at [facility] Dr. Lamar Server Chronic systolic heart failure NSTEMI 7982 diffuse nonobstructive disease?  Takotsubo with EF 25-30% with improvement HFmrEF 50-55% QTc prolongation Avascular necrosis left hip History of pulmonary embolism?  Recent admission 10/29-11 09/09/2024 acute superimposed on chronic left hip pain--underwent total hip arthroplasty 08/27/2024 Apparently was diagnosed with pulmonary emboli in the outpatient setting on 09/18/2024 and started on Eliquis  12/7 readmit difficulty breathing, found to have worsening shortness of breath and felt this was a COPD exacerbation 12/9 developed anemia and was transfused 1 unit PRBC 12/10 Hemoccult positive GI consultted 12/11 scopes as below   Pertinent imaging/studies till date   12/11-moderate prep one 3 mm cecal polyp removed with cold snare 110 mm rectosigmoid polyp removed with hot snare internal hemorrhoids?  Esophagitis +/- small bowel AVM EGD 3 cm hiatal hernia grade C esophagitis single nonbleeding angiodysplastic lesion in duodenum clip placed   Assessment  & Plan :    Acute GI bleed in the setting of Eliquis  use AVM versus colonic polyps as above GI cleared the patient to resume Eliquis -5 twice daily we will watch for further bleeding  Hypotension 2/2 above Sinus tachycardia likely rebound from withdrawal of beta-blocker Resolved now-tachycardia noted on monitors resuming Toprol -XL 12.5 monitor trends Cut back a VaPro to 150 Adjust meds in the next several days  Recent pulmonary embolism 11/29-did not start DOAC apparently as outpatient Will need uninterrupted Eliquis   unless further bleeding  COPD Gold stage IV with acute exacerbation on admission Given Levaquin  respiratory toilet--- no current cough or sputum so discontinued--- gets dupilumab  in the outpatient setting, resume Trelegy/Breztri  while hospitalized no current wheeze Has received 4 days of steroids both IV and oral so have discontinued today His leukocytosis is likely secondary to IV and oral steroids a culture was done for some reason which is negative  Moderately controlled diabetes mellitus with exacerbation of hyperglycemia by steroids Continue sliding scale coverage sensitive coverage Resume Glucotrol  XL 5 twice daily in a.m. Outpatient follow-up  HFpEF last echo 60-65% 06/09/2024 Compensated-meds as above  Anemia likely of expected blood loss in addition to his exacerbation from Eliquis  Received IV iron x 1 this hospital stay +1 unit PRBC 12/9 Follow trends of hemoglobin  Depression Continues bupropion  300 on Celexa  20  Recent left hip surgery 09/06/2024 Eliquis  and pain management as per Dr. OLEGARIO who saw the patient last month I will CC him at discharge      data Reviewed today:  Sodium 137 potassium 3.8 chloride 104 BUN/creatinine 30/1.4 WBC 18.7 hemoglobin 8.4 platelet 360   DVT prophylaxis: Eliquis   Status is: Inpatient Inpatient pending resolution-has ambulated with physical therapy so likely in the next day or so if pain is controlled we can get him home with home health as  long as he does not have bleeding and is able to mobilize to some degree at home-he does have family at home to help support him    Time 60   Subjective:   Main concern is hip pain No bleeding No chest pain Eating and drinking okay      Objective + exam  Vitals:   09/30/24 2025 09/30/24 2135 10/01/24 0058 10/01/24 0457  BP: (!) 103/56  (!) 115/52 130/68  Pulse: (!) 103  93 92  Resp: 20  18 18   Temp: 98.7 F (37.1 C)  98.3 F (36.8 C) 98.4 F (36.9 C)  TempSrc: Oral  Oral Oral   SpO2: 96% 95% 95% 94%  Weight:      Height:       Filed Weights   09/26/24 0621  Weight: 78.5 kg     Examination: Awake coherent no distress EOMI NCAT no focal deficit Chest is clear Abdomen soft no rebound ROM is intact he has some limited motion to his left lower extremity Power is 5/5 otherwise  Scheduled Meds:  sodium chloride    Intravenous Once   acetaminophen   1,000 mg Oral Q6H   apixaban   5 mg Oral BID   arformoterol   15 mcg Nebulization BID   atorvastatin   40 mg Oral QPM   budesonide  (PULMICORT ) nebulizer solution  0.5 mg Nebulization BID   buPROPion   300 mg Oral Daily   citalopram   20 mg Oral Daily   Dupilumab   300 mg Subcutaneous Q14 Days   famotidine   40 mg Oral Daily   feeding supplement  237 mL Oral BID BM   insulin  aspart  0-5 Units Subcutaneous QHS   insulin  aspart  0-9 Units Subcutaneous TID WC   irbesartan   300 mg Oral Daily   metoprolol  succinate  12.5 mg Oral Daily   pantoprazole   40 mg Oral BID   predniSONE   40 mg Oral Q breakfast   senna-docusate  2 tablet Oral QHS   sodium chloride  flush  3 mL Intravenous Q12H   Continuous Infusions: acetaminophen  **OR** acetaminophen , albuterol , HYDROcodone -acetaminophen , LORazepam , trimethobenzamide   Jai-Gurmukh Armando Lauman, MD  [facility]

## 2024-10-01 NOTE — Care Management Important Message (Signed)
 Important Message  Patient Details  Name: Gary Wright MRN: [id number] Date of Birth: December 19, 1945   Important Message Given:  Yes - Medicare IM     Claretta Deed 10/01/2024, 2:30 PM

## 2024-10-01 NOTE — Progress Notes (Signed)
 Physical Therapy Treatment Patient Details Name: Gary Wright MRN: [id number] DOB: 07-04-1946 Today's Date: 10/01/2024   History of Present Illness Pt is 78 yo presenting to [facility] On 12/7 due to progressive shortness of breathe. PMH: recent L THA 11/7, stave IV COPD, CAD s/p CABG, Htn, HLD, DM, hx PE, chronic avascular necrosis of L hip.    PT Comments  Pt is working towards goals. Currently Mod I for bed mobility with increased time this session. CGA for sit to stand and 150 ft of gait with RW. Pt fatigues easily and requires cues for purse lipped breathing/activity pacing throughout session. Pt has assist from family at home. Due to pt current functional status, home set up and available assistance at home no recommended skilled physical therapy services at this time on discharge from acute care hospital setting. Will continue to follow in acute setting in order to ensure that pt returns home with decreased risk for falls, injury, re-hospitalization and improved activity tolerance.     If plan is discharge home, recommend the following: Assistance with cooking/housework;Assist for transportation;Help with stairs or ramp for entrance     Equipment Recommendations  None recommended by PT       Precautions / Restrictions Precautions Precautions: Fall Recall of Precautions/Restrictions: Intact Precaution/Restrictions Comments: watch O2 and HR with mobility Restrictions Weight Bearing Restrictions Per Provider Order: No     Mobility  Bed Mobility Overal bed mobility: Modified Independent             General bed mobility comments: increased time today with HOB elevated.    Transfers Overall transfer level: Needs assistance Equipment used: Rolling walker (2 wheels) Transfers: Sit to/from Stand Sit to Stand: Contact guard assist           General transfer comment: CGA for safety    Ambulation/Gait Ambulation/Gait assistance: Contact guard assist Gait Distance  (Feet): 150 Feet Assistive device: Rolling walker (2 wheels) Gait Pattern/deviations: Step-through pattern, Decreased stride length, Trunk flexed Gait velocity: decreased Gait velocity interpretation: <1.31 ft/sec, indicative of household ambulator   General Gait Details: pt taking 4x standing rest breaks with flexed trunk. O2 sats remained above 90% HR up to 133 bpm. Pt with frequent cues for purse lipped breathing due to dyspnea.     Balance Overall balance assessment: Needs assistance Sitting-balance support: No upper extremity supported, Feet supported Sitting balance-Leahy Scale: Good     Standing balance support: During functional activity, Reliant on assistive device for balance Standing balance-Leahy Scale: Fair Standing balance comment: cues for pacing activities due to pt trying to rush when feeling short of breathe rather than performing purse lipped breathing.        Communication Communication Communication: No apparent difficulties  Cognition Arousal: Alert Behavior During Therapy: WFL for tasks assessed/performed   PT - Cognitive impairments: No apparent impairments     Following commands: Intact      Cueing Cueing Techniques: Verbal cues     General Comments General comments (skin integrity, edema, etc.): Spo2 remained above 91% on room air. HR up to 133 bpm.      Pertinent Vitals/Pain Pain Assessment Pain Assessment: 0-10 Pain Score: 6  Pain Location: L hip with mobility Pain Descriptors / Indicators: Aching, Grimacing, Discomfort Pain Intervention(s): Monitored during session, Limited activity within patient's tolerance, Repositioned     PT Goals (current goals can now be found in the care plan section) Acute Rehab PT Goals Patient Stated Goal: to go home PT Goal Formulation: With patient  Time For Goal Achievement: 10/11/24 Potential to Achieve Goals: Good Progress towards PT goals: Progressing toward goals    Frequency    Min  2X/week      PT Plan  Continue with current POC        AM-PAC PT 6 Clicks Mobility   Outcome Measure  Help needed turning from your back to your side while in a flat bed without using bedrails?: None Help needed moving from lying on your back to sitting on the side of a flat bed without using bedrails?: None Help needed moving to and from a bed to a chair (including a wheelchair)?: A Little Help needed standing up from a chair using your arms (e.g., wheelchair or bedside chair)?: A Little Help needed to walk in hospital room?: A Little Help needed climbing 3-5 steps with a railing? : A Little 6 Click Score: 20    End of Session Equipment Utilized During Treatment: Gait belt Activity Tolerance: Patient tolerated treatment well Patient left: in chair;with call bell/phone within reach;with chair alarm set;with nursing/sitter in room Nurse Communication: Mobility status PT Visit Diagnosis: Other abnormalities of gait and mobility (R26.89);Unsteadiness on feet (R26.81)     Time: 8581-8552 PT Time Calculation (min) (ACUTE ONLY): 29 min  Charges:    $Therapeutic Activity: 23-37 mins PT General Charges $$ ACUTE PT VISIT: 1 Visit                    Dorothyann Maier, DPT, CLT  Acute Rehabilitation Services Office: [phone] (Secure chat preferred)    Dorothyann VEAR Maier 10/01/2024, 5:22 PM

## 2024-10-02 ENCOUNTER — Other Ambulatory Visit (HOSPITAL_COMMUNITY): Payer: Self-pay

## 2024-10-02 DIAGNOSIS — J441 Chronic obstructive pulmonary disease with (acute) exacerbation: Secondary | ICD-10-CM | POA: Diagnosis not present

## 2024-10-02 LAB — CBC WITH DIFFERENTIAL/PLATELET
Abs Immature Granulocytes: 0.35 K/uL — ABNORMAL HIGH (ref 0.00–0.07)
Basophils Absolute: 0 K/uL (ref 0.0–0.1)
Basophils Relative: 0 %
Eosinophils Absolute: 0 K/uL (ref 0.0–0.5)
Eosinophils Relative: 0 %
HCT: 24.4 % — ABNORMAL LOW (ref 39.0–52.0)
Hemoglobin: 7.9 g/dL — ABNORMAL LOW (ref 13.0–17.0)
Immature Granulocytes: 3 %
Lymphocytes Relative: 11 %
Lymphs Abs: 1.5 K/uL (ref 0.7–4.0)
MCH: 31.5 pg (ref 26.0–34.0)
MCHC: 32.4 g/dL (ref 30.0–36.0)
MCV: 97.2 fL (ref 80.0–100.0)
Monocytes Absolute: 1.1 K/uL — ABNORMAL HIGH (ref 0.1–1.0)
Monocytes Relative: 7 %
Neutro Abs: 11.1 K/uL — ABNORMAL HIGH (ref 1.7–7.7)
Neutrophils Relative %: 79 %
Platelets: 394 K/uL (ref 150–400)
RBC: 2.51 MIL/uL — ABNORMAL LOW (ref 4.22–5.81)
RDW: 14.8 % (ref 11.5–15.5)
WBC: 14.1 K/uL — ABNORMAL HIGH (ref 4.0–10.5)
nRBC: 0.1 % (ref 0.0–0.2)

## 2024-10-02 LAB — GLUCOSE, CAPILLARY: Glucose-Capillary: 166 mg/dL — ABNORMAL HIGH (ref 70–99)

## 2024-10-02 LAB — BASIC METABOLIC PANEL WITH GFR
Anion gap: 8 (ref 5–15)
BUN: 19 mg/dL (ref 8–23)
CO2: 28 mmol/L (ref 22–32)
Calcium: 8.2 mg/dL — ABNORMAL LOW (ref 8.9–10.3)
Chloride: 104 mmol/L (ref 98–111)
Creatinine, Ser: 1.1 mg/dL (ref 0.61–1.24)
GFR, Estimated: >60 mL/min (ref >60)
Glucose, Bld: 136 mg/dL — ABNORMAL HIGH (ref 70–99)
Potassium: 4.1 mmol/L (ref 3.5–5.1)
Sodium: 140 mmol/L (ref 135–145)

## 2024-10-02 MED ORDER — APIXABAN 5 MG PO TABS
5.0000 mg | ORAL_TABLET | Freq: Two times a day (BID) | ORAL | 6 refills | Status: AC
  Filled 2024-10-02: qty 60, 30d supply, fill #0

## 2024-10-02 MED ORDER — PANTOPRAZOLE SODIUM 40 MG PO TBEC
40.0000 mg | DELAYED_RELEASE_TABLET | Freq: Two times a day (BID) | ORAL | 0 refills | Status: AC
  Filled 2024-10-02: qty 60, 30d supply, fill #0

## 2024-10-02 MED ORDER — HYDROCODONE-ACETAMINOPHEN 5-325 MG PO TABS
2.0000 | ORAL_TABLET | ORAL | 0 refills | Status: DC | PRN
  Filled 2024-10-02: qty 30, 3d supply, fill #0

## 2024-10-02 MED ORDER — CITALOPRAM HYDROBROMIDE 20 MG PO TABS
20.0000 mg | ORAL_TABLET | Freq: Every day | ORAL | 6 refills | Status: AC
  Filled 2024-10-02: qty 30, 30d supply, fill #0

## 2024-10-02 MED ADMIN — Acetaminophen Tab 500 MG: 1000 mg | ORAL | NDC 50580045711

## 2024-10-02 MED ADMIN — Pantoprazole Sodium EC Tab 40 MG (Base Equiv): 40 mg | ORAL | NDC 65862056099

## 2024-10-02 NOTE — Discharge Summary (Signed)
 Physician Discharge Summary  Gary Wright FMW:[id number] DOB: 17-Oct-1946 DOA: 09/26/2024  PCP: Lucius Krabbe, NP  Admit date: 09/26/2024 Discharge date: 10/02/2024  Time spent: 35 minutes  Recommendations for Outpatient Follow-up:  Close follow-up with Dr. Jerri Beers in the outpatient setting regarding recent hip fracture CC him at discharge so he is aware of issues Needs Chem-12 CBC 1 week Outpatient follow-up required with Dr. Leigh CC him  Discharge Diagnoses:  MAIN problem for hospitalization   COPD exacerbation in the setting of recent pulmonary emboli and then complicated by GI bleed this hospital stay  Please see below for itemized issues addressed in HOpsital- refer to other progress notes for clarity if needed  Discharge Condition: Improved  Diet recommendation: Diabetic  Filed Weights   09/26/24 0621  Weight: 78.5 kg    History of present illness:  78 year old Stage IV COPD prior tobacco Left upper lobe mass status post resection 10/30/2013 robotic assisted wedge resection with lymph node dissection at [facility] Dr. Lamar Server Chronic systolic heart failure NSTEMI 7982 diffuse nonobstructive disease?  Takotsubo with EF 25-30% with improvement HFmrEF 50-55% QTc prolongation Avascular necrosis left hip History of pulmonary embolism?   Recent admission 10/29-11 09/09/2024 acute superimposed on chronic left hip pain--underwent total hip arthroplasty 08/27/2024 Apparently was diagnosed with pulmonary emboli in the outpatient setting on 09/18/2024 and started on Eliquis  12/7 readmit difficulty breathing, found to have worsening shortness of breath and felt this was a COPD exacerbation 12/9 developed anemia and was transfused 1 unit PRBC 12/10 Hemoccult positive GI consultted 12/11 scopes as below    Pertinent imaging/studies till date   12/11-moderate prep one 3 mm cecal polyp removed with cold snare 110 mm rectosigmoid polyp removed with hot  snare internal hemorrhoids?  Esophagitis +/- small bowel AVM EGD 3 cm hiatal hernia grade C esophagitis single nonbleeding angiodysplastic lesion in duodenum clip placed    Assessment  & Plan :      Acute GI bleed in the setting of Eliquis  use AVM versus colonic polyps as above-was cleared by GI to resume Eliquis  No further bleeding at discharge on Eliquis  5 twice daily Patient to be compliant on this Discharging on Protonix  40 twice daily hold Pepcid -needs to avoid Goody powders   Hypotension 2/2 above Sinus tachycardia likely rebound from withdrawal of beta-blocker Resolved now-tachycardia noted on monitors resuming Toprol -XL 12.5 monitor trends Meds were adjusted back to his home meds at discharge His hypotension from the GI bleed and PE resolved-he is normotensive currently Suggest increase of Toprol  in the outpatient setting if still remains a little tachycardic-   Recent left hip surgery 09/06/2024 Eliquis  and pain management as per Dr. OLEGARIO who saw the patient last month I will CC him at discharge\ I have prescribed him some pain meds at discharge to get him over until he is seen by Ortho on the 17th of this month He does not require therapy services after they saw him in the hospital he can maneuver and walk about 150 feet with minimal assistance  Recent pulmonary embolism 11/29-did not start DOAC apparently as outpatient Will need uninterrupted Eliquis  unless further bleeding-I would suggest at least 3 to 6 months in the outpatient setting Given his history of cancer may be reasonable to continue this lifelong I will defer to PCP   COPD Gold stage IV with acute exacerbation on admission Given Levaquin  respiratory toilet--- no current cough or sputum so discontinued--- gets dupilumab  in the outpatient setting, all his home inhalers  were resumed at discharge Has received 4 days of steroids both IV and oral and discontinue them on 12/12 he has no further wheeze-his leukocytosis is  secondary to the steroid He is stable for discharge from this   Moderately controlled diabetes mellitus with exacerbation of hyperglycemia by steroids Continue sliding scale coverage sensitive coverage Resume Glucotrol  XL 5 twice daily in a.m. Outpatient follow-up for glycemic control in the outpatient setting   HFpEF last echo 60-65% 06/09/2024 Compensated-meds as above   Anemia likely of expected blood loss in addition to his exacerbation from Eliquis  Received IV iron x 1 this hospital stay +1 unit PRBC 12/9 Follow trends of hemoglobin   Depression Continues bupropion  300 on Celexa  20 and will need titration downwards in the outpatient setting  Discharge Exam: Vitals:   10/02/24 0506 10/02/24 0739  BP: 136/73 128/64  Pulse: 88 84  Resp:  17  Temp: 98.5 F (36.9 C) 98.3 F (36.8 C)  SpO2: 97% 99%    Subj on day of d/c   Looks well sitting up in bed no fever no chills no wheeze No chest pain No dark stool no tarry stool He is not dizzy  General Exam on discharge  EOMI NCAT no focal deficit moderate dentition No JVD Chest is clear without wheeze Abdomen is soft no rebound ROM is intact moving 4 limbs except for left which is a little bit restricted from the surgery He is perfusing well  Discharge Instructions   Discharge Instructions     Increase activity slowly   Complete by: As directed       Allergies as of 10/02/2024   No Known Allergies      Medication List     STOP taking these medications    Apixaban  Starter Pack (10mg  and 5mg ) Commonly known as: ELIQUIS  STARTER PACK Replaced by: apixaban  5 MG Tabs tablet       TAKE these medications    acetaminophen  325 MG tablet Commonly known as: TYLENOL  Take 1-2 tablets (325-650 mg total) by mouth every 6 (six) hours as needed for mild pain (pain score 1-3) or fever (or temp > 100.5).   Airsupra  90-80 MCG/ACT Aero Generic drug: Albuterol -Budesonide  Inhale 2 puffs into the lungs 4 (four) times  daily as needed.   apixaban  5 MG Tabs tablet Commonly known as: ELIQUIS  Take 1 tablet (5 mg total) by mouth 2 (two) times daily. Replaces: Apixaban  Starter Pack (10mg  and 5mg )   atorvastatin  40 MG tablet Commonly known as: LIPITOR TAKE 1 TABLET(40 MG) BY MOUTH DAILY AT 6 PM   buPROPion  300 MG 24 hr tablet Commonly known as: WELLBUTRIN  XL Take 300 mg by mouth daily.   citalopram  20 MG tablet Commonly known as: CELEXA  Take 1 tablet (20 mg total) by mouth daily. What changed:  medication strength how much to take   Dupixent  300 MG/2ML Soaj Generic drug: Dupilumab  Inject 300 mg into the skin every 14 (fourteen) days.   glipiZIDE  5 MG 24 hr tablet Commonly known as: GLUCOTROL  XL Take 1 tablet (5 mg total) by mouth 2 (two) times daily after a meal.   HYDROcodone -acetaminophen  5-325 MG tablet Commonly known as: NORCO/VICODIN Take 2 tablets by mouth every 4 (four) hours as needed for up to 5 days for severe pain (pain score 7-10).   ipratropium-albuterol  0.5-2.5 (3) MG/3ML Soln Commonly known as: DUONEB Take 3 mLs by nebulization every 6 (six) hours as needed.   metoprolol  succinate 25 MG 24 hr tablet Commonly known as:  TOPROL -XL Take 0.5 tablets (12.5 mg total) by mouth See admin instructions. Take 1/2 tablet (12.5mg ) by mouth once daily at noon.   olmesartan  40 MG tablet Commonly known as: BENICAR  Take 1 tablet (40 mg total) by mouth daily.   pantoprazole  40 MG tablet Commonly known as: PROTONIX  Take 1 tablet (40 mg total) by mouth 2 (two) times daily.   Trelegy Ellipta  200-62.5-25 MCG/ACT Aepb Generic drug: Fluticasone -Umeclidin-Vilant Inhale 1 puff into the lungs every evening.   triamcinolone  ointment 0.5 % Commonly known as: KENALOG  Apply topically 2 (two) times daily. What changed:  how much to take when to take this reasons to take this       Allergies[1]    The results of significant diagnostics from this hospitalization (including imaging,  microbiology, ancillary and laboratory) are listed below for reference.    Significant Diagnostic Studies: CT Angio Chest PE W and/or Wo Contrast Result Date: 09/26/2024 EXAM: CTA of the Chest with contrast for PE 09/26/2024 10:37:58 AM TECHNIQUE: CTA of the chest was performed after the administration of 75 mL of iohexol  (OMNIPAQUE ) 350 MG/ML injection. Multiplanar reformatted images are provided for review. MIP images are provided for review. Automated exposure control, iterative reconstruction, and/or weight based adjustment of the mA/kV was utilized to reduce the radiation dose to as low as reasonably achievable. COMPARISON: Compared to a prior study. CLINICAL HISTORY: Pulmonary embolism (PE) suspected, high prob. FINDINGS: PULMONARY ARTERIES: Pulmonary arteries are adequately opacified for evaluation. Segmental pulmonary embolus in the lateral and posterior basilar segments of the right lower lobe are similar to the prior study. Nonocclusive saddle embolus at the bifurcation of the right middle lobar pulmonary artery is stable. Segmental embolus in the basilar segmental artery of the left lower lobe is stable. Main pulmonary artery is normal in caliber. MEDIASTINUM: The heart and pericardium demonstrate no acute abnormality. Coronary artery calcifications are present. Atherosclerotic calcifications are present at the aortic arch and great vessel origins. No acute abnormality of the thoracic aorta such as dissection, intramural hematoma, aortic ulceration, or rupture. LYMPH NODES: No mediastinal, hilar or axillary lymphadenopathy. LUNGS AND PLEURA: Centrilobular emphysematous changes are present. No focal consolidation or pulmonary edema. No pleural effusion or pneumothorax. UPPER ABDOMEN: Limited images of the upper abdomen are unremarkable. SOFT TISSUES AND BONES: A remote posterior left eighth nonunion rib fracture is again noted. No acute soft tissue abnormality. IMPRESSION: 1. Stable pulmonary emboli,  including segmental emboli in the lateral and posterior basilar segments of the right lower lobe, a nonocclusive saddle embolus at the bifurcation of the right middle lobar pulmonary artery, and a segmental embolus in the basilar segmental artery of the left lower lobe. Electronically signed by: Lonni Necessary MD 09/26/2024 10:57 AM EST RP Workstation: [id number]   DG Chest Portable 1 View Result Date: 09/26/2024 CLINICAL DATA:  Chest pain EXAM: PORTABLE CHEST 1 VIEW COMPARISON:  09/18/2024 FINDINGS: The lungs are clear without focal pneumonia, edema, pneumothorax or pleural effusion. The cardiopericardial silhouette is within normal limits for size. Telemetry leads overlie the chest. IMPRESSION: No active disease. Electronically Signed   By: Camellia Candle M.D.   On: 09/26/2024 07:31   CT Angio Chest PE W and/or Wo Contrast Result Date: 09/18/2024 EXAM: CTA CHEST 09/18/2024 06:54:00 AM TECHNIQUE: CTA of the chest was performed without and with the administration of 75 mL of intravenous iohexol  (OMNIPAQUE ) 350 MG/ML injection. Multiplanar reformatted images are provided for review. MIP images are provided for review. Automated exposure control, iterative reconstruction, and/or weight based  adjustment of the mA/kV was utilized to reduce the radiation dose to as low as reasonably achievable. COMPARISON: Portable chest from today, chest radiograph 08/18/2024, CTA chest 08/02/2024, CTA chest 06/12/2024. CLINICAL HISTORY: Pulmonary embolism (PE) suspected, low to intermediate prob, positive D-dimer. FINDINGS: PULMONARY ARTERIES: The pulmonary arteries are suboptimally opacified beyond the segmental divisions. The subsegmental arteries are poorly evaluated. There is a nonocclusive segmental embolus in the medial segment of the right middle lobe on series 7, axial image 78 and 79. There is at least 1 subsegmental embolus in the right lower lobe on images 95 through 97. Main pulmonary artery is normal in caliber.  MEDIASTINUM: The cardiac size is normal. Small pericardial effusion again collects to the right. There are no findings of acute right heart strain or arterial dilatation. There are patchy 3-vessel coronary artery calcifications. There is atherosclerosis of the aorta and great vessels with mild aortic tortuosity without aneurysm or dissection. The pulmonary veins are nondilated. There is a small hiatal hernia and mild generalized thickening in the thoracic esophagus consistent with esophagitis. LYMPH NODES: No mediastinal, hilar or axillary lymphadenopathy. LUNGS AND PLEURA: There is left greater than right moderately advanced centrilobular emphysema, due to prior left upper lobectomy. Paraseptal emphysema noted in the right apex as well. There is increased linear atelectasis in the medial right upper lobe. No focal consolidation or pulmonary edema. No evidence of pleural effusion or pneumothorax. No suspicious nodules are seen. UPPER ABDOMEN: There is a 3 mm calyceal stone in the superior pole of the left kidney. No acute upper abdominal findings allowing for respiratory motion artifact. SOFT TISSUES AND BONES: There is osteopenia and degenerative changes of the spine. Advanced bilateral shoulder arthrosis. 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 IMPRESSION: 1. Nonocclusive segmental embolus in the medial segment of the right middle lobe and at least one subsegmental embolus in the right lower lobe, with suboptimal evaluation of subsegmental arteries; no acute right heart strain or arterial dilatation. No other appreciable emboli. 2. Left greater than right moderately advanced centrilobular emphysema with right apical paraseptal emphysema; increased linear atelectasis in the medial right upper lobe; no consolidation, effusion, or suspicious nodules. 3. Aortic and coronary atherosclerosis. Electronically signed by: Francis Quam MD 09/18/2024 07:34 AM EST RP Workstation: [id number]   DG Chest  Port 1 View Result Date: 09/18/2024 EXAM: 1 VIEW(S) XRAY OF THE CHEST 09/18/2024 05:22:00 AM COMPARISON: Portable chest 08/18/2024. CLINICAL HISTORY: sob FINDINGS: LUNGS AND PLEURA: Lungs are emphysematous but clear. No focal pulmonary opacity. No pleural effusion. No pneumothorax. HEART AND MEDIASTINUM: The cardiomediastinal silhouette is normal, with aortic atherosclerosis. BONES AND SOFT TISSUES: Mild upper thoracic levoscoliosis and osteopenia. No new osseous abnormality. Advanced DJD of both shoulders. IMPRESSION: 1. No acute cardiopulmonary process. 2. Emphysema. Stable COPD chest. 3. Aortic atherosclerosis. Electronically signed by: Francis Quam MD 09/18/2024 05:31 AM EST RP Workstation: [id number]    Microbiology: Recent Results (from the past 240 hours)  Resp panel by RT-PCR (RSV, Flu A&B, Covid) Anterior Nasal Swab     Status: None   Collection Time: 09/26/24 11:53 AM   Specimen: Anterior Nasal Swab  Result Value Ref Range Status   SARS Coronavirus 2 by RT PCR NEGATIVE NEGATIVE Final   Influenza A by PCR NEGATIVE NEGATIVE Final   Influenza B by PCR NEGATIVE NEGATIVE Final    Comment: (NOTE) The Xpert Xpress SARS-CoV-2/FLU/RSV plus assay is intended as an aid in the diagnosis of influenza from Nasopharyngeal swab specimens and should not be  used as a sole basis for treatment. Nasal washings and aspirates are unacceptable for Xpert Xpress SARS-CoV-2/FLU/RSV testing.  Fact Sheet for Patients: bloggercourse.com  Fact Sheet for Healthcare Providers: seriousbroker.it  This test is not yet approved or cleared by the [place]  FDA and has been authorized for detection and/or diagnosis of SARS-CoV-2 by FDA under an Emergency Use Authorization (EUA). This EUA will remain in effect (meaning this test can be used) for the duration of the COVID-19 declaration under Section 564(b)(1) of the Act, 21 U.S.C. section 360bbb-3(b)(1), unless  the authorization is terminated or revoked.     Resp Syncytial Virus by PCR NEGATIVE NEGATIVE Final    Comment: (NOTE) Fact Sheet for Patients: bloggercourse.com  Fact Sheet for Healthcare Providers: seriousbroker.it  This test is not yet approved or cleared by the [place]  FDA and has been authorized for detection and/or diagnosis of SARS-CoV-2 by FDA under an Emergency Use Authorization (EUA). This EUA will remain in effect (meaning this test can be used) for the duration of the COVID-19 declaration under Section 564(b)(1) of the Act, 21 U.S.C. section 360bbb-3(b)(1), unless the authorization is terminated or revoked.  Performed at [facility], [address], [place], [place] [address]   Culture, blood (single) w Reflex to ID Panel     Status: None   Collection Time: 09/26/24  7:30 PM   Specimen: BLOOD LEFT HAND  Result Value Ref Range Status   Specimen Description BLOOD LEFT HAND  Final   Special Requests   Final    BOTTLES DRAWN AEROBIC AND ANAEROBIC Blood Culture adequate volume   Culture   Final    NO GROWTH 5 DAYS Performed at [facility], [address], [place], [place] [address]    Report Status 10/01/2024 FINAL  Final     Labs: Basic Metabolic Panel: Recent Labs  Lab 09/27/24 0322 09/28/24 1057 09/29/24 0904 09/30/24 0423 10/02/24 0452  NA 140 138 139 137 140  K 3.4* 4.0 4.9 3.8 4.1  CL 104 106 108 104 104  CO2 22 27 21* 25 28  GLUCOSE 160* 93 193* 106* 136*  BUN 14 24* 21 30* 19  CREATININE 1.06 2.12* 1.52* 1.40* 1.10  CALCIUM  8.5* 8.3* 8.4* 8.3* 8.2*   Liver Function Tests: Recent Labs  Lab 09/28/24 1057 09/29/24 0904 09/30/24 0423  AST 41 72* 47*  ALT 28 87* 95*  ALKPHOS 41 48 60  BILITOT 0.7 0.7 0.7  PROT 4.7* 4.9* 5.3*  ALBUMIN  2.6* 2.7* 2.9*   No results for input(s): LIPASE, AMYLASE in the last 168 hours. No results for input(s): AMMONIA in the last 168  hours. CBC: Recent Labs  Lab 09/27/24 0322 09/28/24 1057 09/28/24 1934 09/29/24 0904 09/30/24 0423 10/02/24 0452  WBC 15.4* 13.1*  --  17.3* 18.7* 14.1*  NEUTROABS  --  11.2*  --   --   --  11.1*  HGB 9.8* 6.2* 8.0* 8.6* 8.4* 7.9*  HCT 28.8* 18.8* 23.8* 26.3* 25.8* 24.4*  MCV 93.5 96.4  --  95.3 97.7 97.2  PLT 339 269  --  313 360 394   Cardiac Enzymes: No results for input(s): CKTOTAL, CKMB, CKMBINDEX, TROPONINI in the last 168 hours. BNP: BNP (last 3 results) Recent Labs    08/18/24 1500 09/18/24 0513 09/26/24 0624  BNP 44.3 51.9 57.7    ProBNP (last 3 results) No results for input(s): PROBNP in the last 8760 hours.  CBG: Recent Labs  Lab 10/01/24 1230 10/01/24  1639 10/01/24 1806 10/01/24 1953 10/02/24 0740  GLUCAP 118* 267* 259* 267* 166*    Signed:  Colen Grimes MD   [facility] 10/02/2024, 8:05 AM      [1] No Known Allergies

## 2024-10-03 ENCOUNTER — Encounter (HOSPITAL_COMMUNITY): Payer: Self-pay | Admitting: Gastroenterology

## 2024-10-04 ENCOUNTER — Telehealth: Payer: Self-pay | Admitting: *Deleted

## 2024-10-04 NOTE — Transitions of Care (Post Inpatient/ED Visit) (Signed)
° °  10/04/2024  Name: Drako Maese MRN: [id number] DOB: 1946-02-03  Today's TOC FU Call Status: Today's TOC FU Call Status:: Unsuccessful Call (1st Attempt) Unsuccessful Call (1st Attempt) Date: 10/04/24  Attempted to reach the patient regarding the most recent Inpatient/ED visit.  Follow Up Plan: Additional outreach attempts will be made to reach the patient to complete the Transitions of Care (Post Inpatient/ED visit) call.   Mliss Creed [facility], BSN RN Care Manager/ Transition of Care [place]/ [facility] [phone]

## 2024-10-05 ENCOUNTER — Ambulatory Visit (HOSPITAL_COMMUNITY)

## 2024-10-05 ENCOUNTER — Telehealth: Payer: Self-pay | Admitting: *Deleted

## 2024-10-05 NOTE — Transitions of Care (Post Inpatient/ED Visit) (Signed)
° °  10/05/2024  Name: Benjimin Hadden MRN: [id number] DOB: 10/30/1945  Today's TOC FU Call Status: Today's TOC FU Call Status:: Unsuccessful Call (2nd Attempt) Unsuccessful Call (2nd Attempt) Date: 10/05/24  Attempted to reach the patient regarding the most recent Inpatient/ED visit.  Follow Up Plan: Additional outreach attempts will be made to reach the patient to complete the Transitions of Care (Post Inpatient/ED visit) call.   Mliss Creed [facility], BSN RN Care Manager/ Transition of Care [place]/ [facility] [phone]

## 2024-10-06 ENCOUNTER — Telehealth: Payer: Self-pay | Admitting: *Deleted

## 2024-10-06 NOTE — Transitions of Care (Post Inpatient/ED Visit) (Signed)
° °  10/06/2024  Name: Gary Wright MRN: [id number] DOB: [birth year]  Today's TOC FU Call Status: Today's TOC FU Call Status:: Unsuccessful Call (3rd Attempt) Unsuccessful Call (3rd Attempt) Date: 10/06/24  Attempted to reach the patient regarding the most recent Inpatient/ED visit.  Follow Up Plan: No further outreach attempts will be made at this time. We have been unable to contact the patient.  Mliss Creed [employer], BSN RN Care Manager/ Transition of Care [place]/ [facility] [phone]

## 2024-10-06 NOTE — Telephone Encounter (Signed)
-----   Message from [facility] Alverda M sent at 10/01/2024  1:28 PM EST ----- Call patient to have him come in for a repeat CBC to check Hgb from recent hospitalization.

## 2024-10-06 NOTE — Telephone Encounter (Signed)
 Called and left detailed message to go to the lab this week for CBC for Dr. Leigh. Order is in.  Provided lab hours

## 2024-10-07 ENCOUNTER — Ambulatory Visit (HOSPITAL_COMMUNITY)

## 2024-10-07 ENCOUNTER — Emergency Department (HOSPITAL_COMMUNITY)

## 2024-10-07 ENCOUNTER — Other Ambulatory Visit: Payer: Self-pay

## 2024-10-07 ENCOUNTER — Encounter (HOSPITAL_COMMUNITY): Payer: Self-pay | Admitting: Emergency Medicine

## 2024-10-07 ENCOUNTER — Inpatient Hospital Stay (HOSPITAL_COMMUNITY)

## 2024-10-07 ENCOUNTER — Inpatient Hospital Stay (HOSPITAL_COMMUNITY)
Admission: EM | Admit: 2024-10-07 | Discharge: 2024-10-10 | DRG: 871 | Disposition: A | Discharge status: Home / Self Care | Attending: Internal Medicine | Admitting: Internal Medicine

## 2024-10-07 DIAGNOSIS — Z1152 Encounter for screening for COVID-19: Secondary | ICD-10-CM | POA: Diagnosis not present

## 2024-10-07 DIAGNOSIS — R8281 Pyuria: Secondary | ICD-10-CM | POA: Diagnosis present

## 2024-10-07 DIAGNOSIS — I2782 Chronic pulmonary embolism: Secondary | ICD-10-CM | POA: Diagnosis present

## 2024-10-07 DIAGNOSIS — E099 Drug or chemical induced diabetes mellitus without complications: Secondary | ICD-10-CM | POA: Diagnosis not present

## 2024-10-07 DIAGNOSIS — Z7984 Long term (current) use of oral hypoglycemic drugs: Secondary | ICD-10-CM | POA: Diagnosis not present

## 2024-10-07 DIAGNOSIS — Z86711 Personal history of pulmonary embolism: Secondary | ICD-10-CM | POA: Diagnosis present

## 2024-10-07 DIAGNOSIS — D631 Anemia in chronic kidney disease: Secondary | ICD-10-CM | POA: Diagnosis present

## 2024-10-07 DIAGNOSIS — J441 Chronic obstructive pulmonary disease with (acute) exacerbation: Principal | ICD-10-CM | POA: Diagnosis present

## 2024-10-07 DIAGNOSIS — R6520 Severe sepsis without septic shock: Secondary | ICD-10-CM | POA: Diagnosis present

## 2024-10-07 DIAGNOSIS — D649 Anemia, unspecified: Secondary | ICD-10-CM | POA: Diagnosis present

## 2024-10-07 DIAGNOSIS — A419 Sepsis, unspecified organism: Principal | ICD-10-CM | POA: Diagnosis present

## 2024-10-07 DIAGNOSIS — J9601 Acute respiratory failure with hypoxia: Secondary | ICD-10-CM | POA: Diagnosis present

## 2024-10-07 DIAGNOSIS — B961 Klebsiella pneumoniae [K. pneumoniae] as the cause of diseases classified elsewhere: Secondary | ICD-10-CM | POA: Diagnosis present

## 2024-10-07 DIAGNOSIS — Z902 Acquired absence of lung [part of]: Secondary | ICD-10-CM | POA: Diagnosis not present

## 2024-10-07 DIAGNOSIS — R5381 Other malaise: Secondary | ICD-10-CM | POA: Diagnosis present

## 2024-10-07 DIAGNOSIS — I130 Hypertensive heart and chronic kidney disease with heart failure and stage 1 through stage 4 chronic kidney disease, or unspecified chronic kidney disease: Secondary | ICD-10-CM | POA: Diagnosis present

## 2024-10-07 DIAGNOSIS — Z7901 Long term (current) use of anticoagulants: Secondary | ICD-10-CM

## 2024-10-07 DIAGNOSIS — E440 Moderate protein-calorie malnutrition: Secondary | ICD-10-CM | POA: Diagnosis present

## 2024-10-07 DIAGNOSIS — Z79899 Other long term (current) drug therapy: Secondary | ICD-10-CM

## 2024-10-07 DIAGNOSIS — M1611 Unilateral primary osteoarthritis, right hip: Secondary | ICD-10-CM | POA: Diagnosis present

## 2024-10-07 DIAGNOSIS — I2510 Atherosclerotic heart disease of native coronary artery without angina pectoris: Secondary | ICD-10-CM | POA: Diagnosis present

## 2024-10-07 DIAGNOSIS — E1122 Type 2 diabetes mellitus with diabetic chronic kidney disease: Secondary | ICD-10-CM | POA: Diagnosis present

## 2024-10-07 DIAGNOSIS — E785 Hyperlipidemia, unspecified: Secondary | ICD-10-CM | POA: Diagnosis present

## 2024-10-07 DIAGNOSIS — G8929 Other chronic pain: Secondary | ICD-10-CM | POA: Diagnosis present

## 2024-10-07 DIAGNOSIS — Z85118 Personal history of other malignant neoplasm of bronchus and lung: Secondary | ICD-10-CM

## 2024-10-07 DIAGNOSIS — Z87891 Personal history of nicotine dependence: Secondary | ICD-10-CM

## 2024-10-07 DIAGNOSIS — I5042 Chronic combined systolic (congestive) and diastolic (congestive) heart failure: Secondary | ICD-10-CM | POA: Diagnosis present

## 2024-10-07 DIAGNOSIS — J440 Chronic obstructive pulmonary disease with acute lower respiratory infection: Secondary | ICD-10-CM | POA: Diagnosis present

## 2024-10-07 DIAGNOSIS — N1832 Chronic kidney disease, stage 3b: Secondary | ICD-10-CM | POA: Diagnosis present

## 2024-10-07 DIAGNOSIS — J189 Pneumonia, unspecified organism: Secondary | ICD-10-CM | POA: Diagnosis present

## 2024-10-07 DIAGNOSIS — I10 Essential (primary) hypertension: Secondary | ICD-10-CM | POA: Diagnosis not present

## 2024-10-07 DIAGNOSIS — Z8249 Family history of ischemic heart disease and other diseases of the circulatory system: Secondary | ICD-10-CM

## 2024-10-07 DIAGNOSIS — Z7951 Long term (current) use of inhaled steroids: Secondary | ICD-10-CM

## 2024-10-07 DIAGNOSIS — N390 Urinary tract infection, site not specified: Secondary | ICD-10-CM | POA: Diagnosis present

## 2024-10-07 DIAGNOSIS — Z96642 Presence of left artificial hip joint: Secondary | ICD-10-CM | POA: Diagnosis present

## 2024-10-07 DIAGNOSIS — M87052 Idiopathic aseptic necrosis of left femur: Secondary | ICD-10-CM | POA: Diagnosis present

## 2024-10-07 LAB — I-STAT CG4 LACTIC ACID, ED
Lactic Acid, Venous: 2.7 mmol/L (ref 0.5–1.9)
Lactic Acid, Venous: 1.7 mmol/L (ref 0.5–1.9)

## 2024-10-07 LAB — CBC WITH DIFFERENTIAL/PLATELET
Abs Immature Granulocytes: 0.19 K/uL — ABNORMAL HIGH (ref 0.00–0.07)
Basophils Absolute: 0 K/uL (ref 0.0–0.1)
Basophils Relative: 0 %
Eosinophils Absolute: 0.1 K/uL (ref 0.0–0.5)
Eosinophils Relative: 1 %
HCT: 30.8 % — ABNORMAL LOW (ref 39.0–52.0)
Hemoglobin: 9.9 g/dL — ABNORMAL LOW (ref 13.0–17.0)
Immature Granulocytes: 1 %
Lymphocytes Relative: 9 %
Lymphs Abs: 1.5 K/uL (ref 0.7–4.0)
MCH: 31.9 pg (ref 26.0–34.0)
MCHC: 32.1 g/dL (ref 30.0–36.0)
MCV: 99.4 fL (ref 80.0–100.0)
Monocytes Absolute: 0.9 K/uL (ref 0.1–1.0)
Monocytes Relative: 5 %
Neutro Abs: 14.7 K/uL — ABNORMAL HIGH (ref 1.7–7.7)
Neutrophils Relative %: 84 %
Platelets: 553 K/uL — ABNORMAL HIGH (ref 150–400)
RBC: 3.1 MIL/uL — ABNORMAL LOW (ref 4.22–5.81)
RDW: 15.2 % (ref 11.5–15.5)
WBC: 17.4 K/uL — ABNORMAL HIGH (ref 4.0–10.5)
nRBC: 0 % (ref 0.0–0.2)

## 2024-10-07 LAB — URINALYSIS, W/ REFLEX TO CULTURE (INFECTION SUSPECTED)
Bilirubin Urine: NEGATIVE
Glucose, UA: NEGATIVE mg/dL
Ketones, ur: 20 mg/dL — AB
Nitrite: NEGATIVE
Protein, ur: 30 mg/dL — AB
Specific Gravity, Urine: 1.018 (ref 1.005–1.030)
pH: 6 (ref 5.0–8.0)

## 2024-10-07 LAB — PROTIME-INR
INR: 1.3 — ABNORMAL HIGH (ref 0.8–1.2)
Prothrombin Time: 16.9 s — ABNORMAL HIGH (ref 11.4–15.2)

## 2024-10-07 LAB — COMPREHENSIVE METABOLIC PANEL WITH GFR
ALT: 26 U/L (ref 0–44)
AST: 23 U/L (ref 15–41)
Albumin: 3.8 g/dL (ref 3.5–5.0)
Alkaline Phosphatase: 69 U/L (ref 38–126)
Anion gap: 14 (ref 5–15)
BUN: 17 mg/dL (ref 8–23)
CO2: 22 mmol/L (ref 22–32)
Calcium: 9.3 mg/dL (ref 8.9–10.3)
Chloride: 100 mmol/L (ref 98–111)
Creatinine, Ser: 0.96 mg/dL (ref 0.61–1.24)
GFR, Estimated: >60 mL/min (ref >60)
Glucose, Bld: 164 mg/dL — ABNORMAL HIGH (ref 70–99)
Potassium: 4 mmol/L (ref 3.5–5.1)
Sodium: 136 mmol/L (ref 135–145)
Total Bilirubin: 1.4 mg/dL — ABNORMAL HIGH (ref 0.0–1.2)
Total Protein: 6.9 g/dL (ref 6.5–8.1)

## 2024-10-07 LAB — GLUCOSE, CAPILLARY: Glucose-Capillary: 202 mg/dL — ABNORMAL HIGH (ref 70–99)

## 2024-10-07 LAB — CBG MONITORING, ED: Glucose-Capillary: 187 mg/dL — ABNORMAL HIGH (ref 70–99)

## 2024-10-07 LAB — RESP PANEL BY RT-PCR (RSV, FLU A&B, COVID)  RVPGX2
Influenza A by PCR: NEGATIVE
Influenza B by PCR: NEGATIVE
Resp Syncytial Virus by PCR: NEGATIVE
SARS Coronavirus 2 by RT PCR: NEGATIVE

## 2024-10-07 MED ORDER — HYDROMORPHONE HCL 1 MG/ML IJ SOLN: 0.5000 mg | Freq: Once | INTRAMUSCULAR | Status: DC

## 2024-10-07 MED ORDER — METHYLPREDNISOLONE SODIUM SUCC 125 MG IJ SOLR
125.0000 mg | Freq: Once | INTRAMUSCULAR | Status: AC
  Administered 2024-10-07: 17:00:00 125 mg via INTRAVENOUS
  Filled 2024-10-07: qty 2

## 2024-10-07 MED ORDER — SODIUM CHLORIDE 0.9 % IV SOLN
2.0000 g | Freq: Once | INTRAVENOUS | Status: AC
  Administered 2024-10-07: 17:00:00 2 g via INTRAVENOUS
  Filled 2024-10-07: qty 12.5

## 2024-10-07 MED ORDER — FENTANYL CITRATE (PF) 50 MCG/ML IJ SOSY
25.0000 ug | PREFILLED_SYRINGE | INTRAMUSCULAR | Status: DC | PRN
  Administered 2024-10-07 – 2024-10-09 (×3): 25 ug via INTRAVENOUS
  Filled 2024-10-07 (×3): qty 1

## 2024-10-07 MED ORDER — MAGNESIUM SULFATE 2 GM/50ML IV SOLN
2.0000 g | Freq: Once | INTRAVENOUS | Status: AC
  Administered 2024-10-07: 17:00:00 2 g via INTRAVENOUS
  Filled 2024-10-07: qty 50

## 2024-10-07 MED ORDER — SODIUM CHLORIDE 0.9 % IV BOLUS
2000.0000 mL | Freq: Once | INTRAVENOUS | Status: AC
  Administered 2024-10-07: 17:00:00 2000 mL via INTRAVENOUS

## 2024-10-07 MED ORDER — INSULIN ASPART 100 UNIT/ML IJ SOLN
0.0000 [IU] | INTRAMUSCULAR | Status: DC
  Administered 2024-10-07: 3 [IU] via SUBCUTANEOUS
  Administered 2024-10-07 – 2024-10-08 (×4): 2 [IU] via SUBCUTANEOUS
  Administered 2024-10-08: 5 [IU] via SUBCUTANEOUS
  Administered 2024-10-08: 2 [IU] via SUBCUTANEOUS
  Administered 2024-10-09 (×2): 3 [IU] via SUBCUTANEOUS
  Administered 2024-10-09 (×2): 2 [IU] via SUBCUTANEOUS
  Administered 2024-10-09: 5 [IU] via SUBCUTANEOUS
  Administered 2024-10-10 (×2): 2 [IU] via SUBCUTANEOUS
  Administered 2024-10-10: 1 [IU] via SUBCUTANEOUS
  Filled 2024-10-07 (×2): qty 2
  Filled 2024-10-07: qty 3
  Filled 2024-10-07: qty 1
  Filled 2024-10-07: qty 3
  Filled 2024-10-07 (×2): qty 5
  Filled 2024-10-07 (×5): qty 2

## 2024-10-07 MED ORDER — ALBUTEROL SULFATE (2.5 MG/3ML) 0.083% IN NEBU
10.0000 mg | INHALATION_SOLUTION | RESPIRATORY_TRACT | Status: AC
  Administered 2024-10-07: 17:00:00 10 mg via RESPIRATORY_TRACT
  Filled 2024-10-07: qty 12

## 2024-10-07 MED ORDER — IOHEXOL 350 MG/ML SOLN
75.0000 mL | Freq: Once | INTRAVENOUS | Status: AC | PRN
  Administered 2024-10-07: 21:00:00 75 mL via INTRAVENOUS

## 2024-10-07 MED ORDER — HYDROMORPHONE HCL 1 MG/ML IJ SOLN
0.5000 mg | Freq: Once | INTRAMUSCULAR | Status: AC
  Administered 2024-10-07: 17:00:00 0.5 mg via INTRAVENOUS
  Filled 2024-10-07: qty 1

## 2024-10-07 MED ORDER — VANCOMYCIN HCL 1750 MG/350ML IV SOLN
1750.0000 mg | Freq: Once | INTRAVENOUS | Status: AC
  Administered 2024-10-07: 17:00:00 1750 mg via INTRAVENOUS
  Filled 2024-10-07: qty 350

## 2024-10-07 MED ADMIN — Acetaminophen Tab 325 MG: 650 mg | ORAL | @ 19:00:00 | NDC 00904677361

## 2024-10-07 MED FILL — Acetaminophen Tab 325 MG: 650.0000 mg | ORAL | Qty: 2 | Status: AC

## 2024-10-07 NOTE — Progress Notes (Signed)
 ED Pharmacy Antibiotic Sign Off An antibiotic consult was received from an ED provider for vancomycin  and cefepime  per pharmacy dosing for HCAP (patient reports fever since discharged from hospital ~5 days ago). A chart review was completed to assess appropriateness.   The following one time order(s) were placed:  Vancomycin  1750mg  IV x1 Cefepime  2g IV x1  Further antibiotic and/or antibiotic pharmacy consults should be ordered by the admitting provider if indicated.   Thank you for allowing pharmacy to be a part of this patient's care.   Lacinda Moats, [facility]  Clinical Pharmacist 10/07/2024 4:52 PM

## 2024-10-07 NOTE — Subjective & Objective (Signed)
 SOB for the past 3 days, known hx of COPD on O2 (2L) HAd recent admit for COPD exacerbation was discharged on 10/02/2024  He has hx of severe left hip pain due to Avascular necrosis and -underwent total hip arthroplasty 08/27/2024  Has been reporting of hip pain at the time of DC has been prescribed pain meds and was supposed to see Orthopedics today for the follow up  Hx of PE 09/18/2024 initially on Eliquis  during last admission had a GI bleed needing 1 unit PRBC, scopes found Esophagitis +/- small bowel AVM he was cleared by GI to resume eliquis 

## 2024-10-07 NOTE — ED Notes (Signed)
 NT notified RN of pt's high temp & low BP

## 2024-10-07 NOTE — Assessment & Plan Note (Signed)
-  -   Will initiate: Steroid taper  -  Antibiotics rocephin  - XopenexPRN, - scheduled duoneb,  -  Breo or Dulera at discharge   -  Mucinex .  Titrate O2 to saturation >90%. Follow patients respiratory status. ***Order nfluenza PCR *** VBG  - *** PCCM consulted for e-link monitoring,  - *** BiPAP ordered PRN for increased work of breathing.  Currently mentating well no evidence of symptomatic hypercarbia

## 2024-10-07 NOTE — ED Triage Notes (Signed)
 Patient BIB EMS from home c/o SOB x 3days. Per report patient was recently diagnose with Pneumonia. 1x Duoneb given by EMS. Patient states his been coughing up mucous and have difficulty breathing. Patient denies chest pain. Denies N/V/D.

## 2024-10-07 NOTE — ED Provider Notes (Signed)
 [place] EMERGENCY DEPARTMENT AT [facility] Provider Note   CSN: [id number] Arrival date & time: 10/07/24  1605     Patient presents with: Shortness of Breath   Gary Wright is a 78 y.o. male With history of stage IV COPD, not on home oxygen, history of acute GI bleeding in the setting of Eliquis  use, still on Eliquis , history of pulmonary embolism diagnosed last month, presented to the ED with complaint of shortness of breath.  Patient ports he has had a fever since leaving the hospital about 5 days ago, has persistent coughing.  He is worried about pneumonia.  EMS gave 1 DuoNeb and route to the hospital.  I did review his discharge summary from the hospital on December 13, for which he was admitted for acute GI bleeding in setting of Eliquis  use, with AVM noted on colonoscopy and cleared to resume Eliquis  at the time of discharge.  Was also noted to have a PE recently diagnosed last month.  History of anemia for which he got 1 unit of blood on his last hospitalization this month. Hgb 7.9 at discharge on 10/02/24, WBC 14.1.   HPI     Prior to Admission medications  Medication Sig Start Date End Date Taking? Authorizing Provider  acetaminophen  (TYLENOL ) 325 MG tablet Take 1-2 tablets (325-650 mg total) by mouth every 6 (six) hours as needed for mild pain (pain score 1-3) or fever (or temp > 100.5). 08/31/24   Sherrill Cable Latif, DO  Albuterol -Budesonide  (AIRSUPRA ) 90-80 MCG/ACT AERO Inhale 2 puffs into the lungs 4 (four) times daily as needed. 09/09/24   Jesus Bernardino MATSU, MD  apixaban  (ELIQUIS ) 5 MG TABS tablet Take 1 tablet (5 mg total) by mouth 2 (two) times daily. 10/02/24   Samtani, Jai-Gurmukh, MD  atorvastatin  (LIPITOR) 40 MG tablet TAKE 1 TABLET(40 MG) BY MOUTH DAILY AT 6 PM 08/18/23   Pietro Redell RAMAN, MD  buPROPion  (WELLBUTRIN  XL) 300 MG 24 hr tablet Take 300 mg by mouth daily. 05/11/16   [provider]  citalopram  (CELEXA ) 20 MG tablet Take 1  tablet (20 mg total) by mouth daily. 10/02/24   Samtani, Jai-Gurmukh, MD  Dupilumab  (DUPIXENT ) 300 MG/2ML SOAJ Inject 300 mg into the skin every 14 (fourteen) days. 07/21/24   [name] Dorn NOVAK, MD  Fluticasone -Umeclidin-Vilant (TRELEGY ELLIPTA ) 200-62.5-25 MCG/ACT AEPB Inhale 1 puff into the lungs every evening. 09/09/24   Jesus Bernardino MATSU, MD  glipiZIDE  (GLUCOTROL  XL) 5 MG 24 hr tablet Take 1 tablet (5 mg total) by mouth 2 (two) times daily after a meal. 09/20/24   Lucius Krabbe, NP  HYDROcodone -acetaminophen  (NORCO/VICODIN) 5-325 MG tablet Take 2 tablets by mouth every 4 (four) hours as needed for up to 5 days for severe pain (pain score 7-10). 10/02/24 10/07/24  Samtani, Jai-Gurmukh, MD  ipratropium-albuterol  (DUONEB) 0.5-2.5 (3) MG/3ML SOLN Take 3 mLs by nebulization every 6 (six) hours as needed. 09/09/24   Jesus Bernardino MATSU, MD  metoprolol  succinate (TOPROL -XL) 25 MG 24 hr tablet Take 0.5 tablets (12.5 mg total) by mouth See admin instructions. Take 1/2 tablet (12.5mg ) by mouth once daily at noon. 08/06/24   Arlon Carliss ORN, DO  olmesartan  (BENICAR ) 40 MG tablet Take 1 tablet (40 mg total) by mouth daily. 06/29/24 06/29/25  Darlean Ozell NOVAK, MD  pantoprazole  (PROTONIX ) 40 MG tablet Take 1 tablet (40 mg total) by mouth 2 (two) times daily. 10/02/24   Samtani, Jai-Gurmukh, MD  triamcinolone  ointment (KENALOG ) 0.5 % Apply topically 2 (two)  times daily. Patient taking differently: Apply 1 Application topically 2 (two) times daily as needed (skin irritaiton). 06/24/24   Lucius Krabbe, NP    Allergies: Patient has no known allergies.    Review of Systems  Updated Vital Signs BP (!) 96/53 (BP Location: Right Arm)   Pulse (!) 110   Temp 98.4 F (36.9 C) (Oral)   Resp 20   Ht 5' 8 (1.727 m)   Wt 78.8 kg   SpO2 99%   BMI 26.41 kg/m   Physical Exam Constitutional:      General: He is not in acute distress. HENT:     Head: Normocephalic and atraumatic.  Eyes:     Conjunctiva/sclera:  Conjunctivae normal.     Pupils: Pupils are equal, round, and reactive to light.  Cardiovascular:     Rate and Rhythm: Regular rhythm. Tachycardia present.  Pulmonary:     Effort: Pulmonary effort is normal. No respiratory distress.     Comments: Expiratory wheezing, speaking in short sentences Abdominal:     General: There is no distension.     Tenderness: There is no abdominal tenderness.  Skin:    General: Skin is warm and dry.  Neurological:     General: No focal deficit present.     Mental Status: He is alert. Mental status is at baseline.  Psychiatric:        Mood and Affect: Mood normal.        Behavior: Behavior normal.     (all labs ordered are listed, but only abnormal results are displayed) Labs Reviewed  COMPREHENSIVE METABOLIC PANEL WITH GFR - Abnormal; Notable for the following components:      Result Value   Glucose, Bld 164 (*)    Total Bilirubin 1.4 (*)    All other components within normal limits  CBC WITH DIFFERENTIAL/PLATELET - Abnormal; Notable for the following components:   WBC 17.4 (*)    RBC 3.10 (*)    Hemoglobin 9.9 (*)    HCT 30.8 (*)    Platelets 553 (*)    Neutro Abs 14.7 (*)    Abs Immature Granulocytes 0.19 (*)    All other components within normal limits  PROTIME-INR - Abnormal; Notable for the following components:   Prothrombin Time 16.9 (*)    INR 1.3 (*)    All other components within normal limits  URINALYSIS, W/ REFLEX TO CULTURE (INFECTION SUSPECTED) - Abnormal; Notable for the following components:   APPearance CLOUDY (*)    Hgb urine dipstick SMALL (*)    Ketones, ur 20 (*)    Protein, ur 30 (*)    Leukocytes,Ua SMALL (*)    Bacteria, UA MANY (*)    All other components within normal limits  GLUCOSE, CAPILLARY - Abnormal; Notable for the following components:   Glucose-Capillary 202 (*)    All other components within normal limits  I-STAT CG4 LACTIC ACID, ED - Abnormal; Notable for the following components:   Lactic Acid,  Venous 2.7 (*)    All other components within normal limits  CBG MONITORING, ED - Abnormal; Notable for the following components:   Glucose-Capillary 187 (*)    All other components within normal limits  RESP PANEL BY RT-PCR (RSV, FLU A&B, COVID)  RVPGX2  CULTURE, BLOOD (ROUTINE X 2)  CULTURE, BLOOD (ROUTINE X 2)  URINE CULTURE  HEMOGLOBIN A1C  I-STAT CG4 LACTIC ACID, ED  I-STAT CG4 LACTIC ACID, ED    EKG: EKG Interpretation Date/Time:  Thursday  October 07 2024 16:20:20 EST Ventricular Rate:  129 PR Interval:  129 QRS Duration:  118 QT Interval:  325 QTC Calculation: 477 R Axis:   -31  Text Interpretation: Sinus tachycardia Confirmed by Cottie Cough [phone]) on 10/07/2024 5:11:58 PM  Radiology: CT Angio Chest PE W and/or Wo Contrast Result Date: 10/07/2024 EXAM: CTA CHEST 10/07/2024 09:18:02 PM TECHNIQUE: CTA of the chest was performed without and with the administration of 75 mL of iohexol  (OMNIPAQUE ) 350 MG/ML injection. Multiplanar reformatted images are provided for review. MIP images are provided for review. Automated exposure control, iterative reconstruction, and/or weight based adjustment of the mA/kV was utilized to reduce the radiation dose to as low as reasonably achievable. COMPARISON: CT of the chest 100 mg 09/26/2024. CLINICAL HISTORY: Pulmonary embolism (PE) suspected, high prob. FINDINGS: PULMONARY ARTERIES: Pulmonary arteries are adequately opacified for evaluation. There is a single small segmental right lower lobe pulmonary embolism which appears unchanged. All other pulmonary emboli have resolved in the interval. No new pulmonary emboli are seen. Main pulmonary artery is normal in caliber. MEDIASTINUM: The heart and pericardium demonstrate no acute abnormality. There is no acute abnormality of the thoracic aorta. LYMPH NODES: No mediastinal, hilar or axillary lymphadenopathy. LUNGS AND PLEURA: Mild to moderate emphysema present. Mild ground glass opacities within  the bilateral lower lobes and right upper lobe. There is a calcified granuloma in the left upper lobe. No focal consolidation or pulmonary edema. No evidence of pleural effusion or pneumothorax. UPPER ABDOMEN: There is a small hiatal hernia. SOFT TISSUES AND BONES: Anterior chest calcifications. No acute bone or soft tissue abnormality. IMPRESSION: 1. Single small segmental right lower lobe pulmonary embolism, unchanged from prior study. All other pulmonary emboli have resolved in the interval. No new pulmonary emboli. 2. Mild ground glass opacities within the bilateral lower lobes and right upper lobe may represent mild edema or infectious/inflammatory process . 3. Mild to moderate emphysema. Pulmonary emphysema is an independent risk factor for lung cancer. Recommend consideration for evaluation for a low-dose CT lung cancer screening program. 4. Calcified granuloma in the left upper lobe. 5. Small hiatal hernia. Electronically signed by: Greig Pique MD 10/07/2024 09:32 PM EST RP Workstation: [id number]   DG Chest Port 1 View Result Date: 10/07/2024 EXAM: 1 VIEW(S) XRAY OF THE CHEST 10/07/2024 05:02:00 PM COMPARISON: 09/26/2024 CLINICAL HISTORY: Suspected sepsis FINDINGS: LUNGS AND PLEURA: No focal pulmonary opacity. No pleural effusion. No pneumothorax. HEART AND MEDIASTINUM: Aortic atherosclerosis. No acute abnormality of the cardiac and mediastinal silhouettes. BONES AND SOFT TISSUES: Advanced degenerative changes of left shoulder. No acute osseous abnormality. IMPRESSION: 1. No acute cardiopulmonary abnormality. Electronically signed by: Morgane Naveau MD 10/07/2024 05:37 PM EST RP Workstation: [id number]     .Critical Care  Performed by: Cottie Cough PARAS, MD Authorized by: Cottie Cough PARAS, MD   Critical care provider statement:    Critical care time (minutes):  45   Critical care time was exclusive of:  Separately billable procedures and treating other patients   Critical care was necessary  to treat or prevent imminent or life-threatening deterioration of the following conditions:  Sepsis   Critical care was time spent personally by me on the following activities:  Ordering and performing treatments and interventions, ordering and review of laboratory studies, ordering and review of radiographic studies, pulse oximetry, review of old charts, examination of patient and evaluation of patient's response to treatment    Medications Ordered in the ED  albuterol  (PROVENTIL ) (2.5 MG/3ML) 0.083% nebulizer solution 10  mg (10 mg Nebulization New Bag/Given 10/07/24 1706)  insulin  aspart (novoLOG ) injection 0-9 Units (2 Units Subcutaneous Given 10/07/24 2121)  HYDROmorphone  (DILAUDID ) injection 0.5 mg (0 mg Intravenous Hold 10/07/24 2121)  sodium chloride  0.9 % bolus 2,000 mL (0 mLs Intravenous Stopped 10/07/24 2015)  methylPREDNISolone  sodium succinate (SOLU-MEDROL ) 125 mg/2 mL injection 125 mg (125 mg Intravenous Given 10/07/24 1717)  magnesium  sulfate IVPB 2 g 50 mL (0 g Intravenous Stopped 10/07/24 1816)  HYDROmorphone  (DILAUDID ) injection 0.5 mg (0.5 mg Intravenous Given 10/07/24 1716)  vancomycin  (VANCOREADY) IVPB 1750 mg/350 mL (0 mg Intravenous Stopped 10/07/24 1915)  ceFEPIme  (MAXIPIME ) 2 g in sodium chloride  0.9 % 100 mL IVPB (0 g Intravenous Stopped 10/07/24 1746)  acetaminophen  (TYLENOL ) tablet 650 mg (650 mg Oral Given 10/07/24 1908)  iohexol  (OMNIPAQUE ) 350 MG/ML injection 75 mL (75 mLs Intravenous Contrast Given 10/07/24 2103)    Clinical Course as of 10/07/24 2312  Thu Oct 07, 2024  1952 Patient was reassessed and his work of breathing appears to have improved, although he has consistent wheezing which may be related to his underlying advanced COPD.  He does remain tachycardic, with borderline BP, now finishing his 2nd liter of IV fluids [MT]    Clinical Course User Index [MT] Litzi Binning, Donnice PARAS, MD                                 Medical Decision Making Amount and/or  Complexity of Data Reviewed Labs: ordered. Radiology: ordered.  Risk OTC drugs. Prescription drug management. Decision regarding hospitalization.   This patient presents to the ED with concern for SOB. This involves an extensive number of treatment options, and is a complaint that carries with it a high risk of complications and morbidity.  The differential diagnosis includes COPD exacerbation versus pneumonia versus anemia versus pneumothorax versus other  Co-morbidities that complicate the patient evaluation: History of stage IV COPD  Additional history obtained from EMS  External records from outside source obtained and reviewed including recent hospital discharge summary and workup  I ordered and personally interpreted labs.  The pertinent results include: Chronic stable leukocytosis.  UA with many bacteria and small leukocytes.  CMP unremarkable.  COVID and flu are negative.  Lactate normal  I ordered imaging studies including x-ray of the chest, CT PE study I independently visualized and interpreted imaging which showed likely multifocal pneumonia I agree with the radiologist interpretation  The patient was maintained on a cardiac monitor.  I personally viewed and interpreted the cardiac monitored which showed an underlying rhythm of: Sinus tachycardia  Per my interpretation the patient's ECG shows no acute ischemia  I ordered medication including IV pain medicine for chronic hip pain, broad-spectrum antibiotics for HCAP pneumonia, treatment for COPD exacerbation  I have reviewed the patients home medicines and have made adjustments as needed   After the interventions noted above, I reevaluated the patient and found that they have: improved   Disposition:  After consideration of the diagnostic results and the patient's response to treatment, I feel that the patient would benefit from medical admission.      Final diagnoses:  COPD exacerbation (HCC)  Sepsis, due to  unspecified organism, unspecified whether acute organ dysfunction present [facility])  Pneumonia due to infectious organism, unspecified laterality, unspecified part of lung    ED Discharge Orders     None          Eymi Lipuma, Donnice PARAS,  MD 10/07/24 2312

## 2024-10-07 NOTE — H&P (Signed)
 Gary Wright [facility] FMW:[id number] DOB: 11-02-1945 DOA: 10/07/2024     PCP: Lucius Krabbe, NP   Outpatient Specialists: * NONE CARDS: * Dr. Redell Shallow, MD  NEphrology: *  Dr. No care team member to display  NEurology *   Dr. Pulmonary *  Dr.  Oncology * Dr.No care team member to display  GI* Dr.  Gwen, LB) No care team member to display Urology Dr. *  Patient arrived to ER on 10/07/24 at 1605 Referred by Attending Trifan, Donnice PARAS, MD   Patient coming from:    home Lives alone,   *** With family      Chief Complaint:   Chief Complaint  Patient presents with   Shortness of Breath    HPI: Gary Wright is a 78 y.o. male with medical history significant of COPD on O2, hx of lung ca sp ressection, systolic CHF, Takotsubo with EF 25-30% with improvement HFmrEF 50-55%, Avascular necrosis left hip sp replacement, SB AVM's, Gi bleed, hx of PE on eliquis     Presented with  worsening shortness of breath,cough productive of clear sputum  SOB for the past 3 days, known hx of COPD on O2 (2L) HAd recent admit for COPD exacerbation was discharged on 10/02/2024  He has hx of severe left hip pain due to Avascular necrosis and -underwent total hip arthroplasty 08/27/2024  Has been reporting of hip pain at the time of DC has been prescribed pain meds and was supposed to see Orthopedics today for the follow up  Hx of PE 09/18/2024 initially on Eliquis  during last admission had a GI bleed needing 1 unit PRBC, scopes found Esophagitis +/- small bowel AVM he was cleared by GI to resume eliquis       Denies significant ETOH intake *** Does not smoke*** but interested in quitting***  Denies marijuana use ***    Regarding pertinent Chronic problems:    Hyperlipidemia -  on statins Lipitor (atorvastatin )  Lipid Panel     Component Value Date/Time   CHOL 150 12/11/2022 1102   TRIG 91 12/11/2022 1102   HDL 75 12/11/2022 1102   CHOLHDL 2.0 12/11/2022 1102    CHOLHDL 2.0 10/03/2016 1327   VLDL 16 10/03/2016 1327   LDLCALC 58 12/11/2022 1102   LABVLDL 17 12/11/2022 1102     HTN on toprol    chronic CHF diastolic  - last echo  Recent Results (from the past 56199 hours)  ECHOCARDIOGRAM COMPLETE   Collection Time: 06/13/24  5:19 PM  Result Value   Weight 2,976   Height 68   BP 132/90   Single Plane A4C EF 48.2   S' Lateral 3.20   AR max vel 1.75   AV Area mean vel 1.83   AV Area VTI 1.76   Est EF 60 - 65%   AV Peak grad 7.2   Ao pk vel 1.34   AV Mean grad 3.5   Narrative      ECHOCARDIOGRAM REPORT        1. Left ventricular ejection fraction, by estimation, is 60 to 65%. The left ventricle has normal function. The left ventricle has no regional wall motion abnormalities. Left ventricular diastolic parameters are consistent with Grade I diastolic  dysfunction (impaired relaxation).  2. Right ventricular systolic function is normal. The right ventricular size is normal. There is normal pulmonary artery systolic pressure.  3. The mitral valve is normal in structure. No evidence of mitral valve regurgitation. No evidence of mitral stenosis.  4. The tricuspid valve is abnormal.  5. The aortic valve is tricuspid. There is mild calcification of the aortic valve. There is mild thickening of the aortic valve. Aortic valve regurgitation is not visualized. No aortic stenosis is present.  6. The inferior vena cava is normal in size with greater than 50% respiratory variability, suggesting right atrial pressure of 3 mmHg.         *** CAD  - On Aspirin , statin, betablocker, Plavix                 - *followed by cardiology                - last cardiac cath        DM 2 -  Lab Results  Component Value Date   HGBA1C 7.6 (H) 08/20/2024   ****on insulin , PO meds only, diet controlled      COPD - not **followed by pulmonology *** not  on baseline oxygen  *L,    *** OSA -on nocturnal oxygen, *CPAP, *noncompliant with CPAP    Hx of DVT/PE on  - anticoagulation with   Eliquis ,       Chronic anemia - baseline hg Hemoglobin & Hematocrit  Recent Labs    09/30/24 0423 10/02/24 0452 10/07/24 1640  HGB 8.4* 7.9* 9.9*   Iron/TIBC/Ferritin/ %Sat    Component Value Date/Time   IRON 33 (L) 09/28/2024 1007   TIBC 224 (L) 09/28/2024 1007   FERRITIN 311 08/30/2024 0635   IRONPCTSAT 15 (L) 09/28/2024 1007    Cancer: Lung cancer     While in ER: Clinical Course as of 10/07/24 2036  Thu Oct 07, 2024  1952 Patient was reassessed and his work of breathing appears to have improved, although he has consistent wheezing which may be related to his underlying advanced COPD.  He does remain tachycardic, with borderline BP, now finishing his 2nd liter of IV fluids [MT]    Clinical Course User Index [MT] Trifan, Donnice PARAS, MD         Lab Orders         Culture, blood (Routine x 2)         Urine Culture         Resp panel by RT-PCR (RSV, Flu A&B, Covid) Anterior Nasal Swab         Comprehensive metabolic panel         CBC with Differential         Protime-INR         Urinalysis, w/ Reflex to Culture (Infection Suspected) -Urine, Clean Catch         I-Stat Lactic Acid, ED      CT HEAD *** NON acute   MRI brain  ***no acute CVA  CXR - ***NON acute  CTabd/pelvis - ***nonacute  CTA chest - ***nonacute, no PE, * no evidence of infiltrate  Following Medications were ordered in ER: Medications  albuterol  (PROVENTIL ) (2.5 MG/3ML) 0.083% nebulizer solution 10 mg (10 mg Nebulization New Bag/Given 10/07/24 1706)  sodium chloride  0.9 % bolus 2,000 mL (0 mLs Intravenous Stopped 10/07/24 2015)  methylPREDNISolone  sodium succinate (SOLU-MEDROL ) 125 mg/2 mL injection 125 mg (125 mg Intravenous Given 10/07/24 1717)  magnesium  sulfate IVPB 2 g 50 mL (0 g Intravenous Stopped 10/07/24 1816)  HYDROmorphone  (DILAUDID ) injection 0.5 mg (0.5 mg Intravenous Given 10/07/24 1716)  vancomycin  (VANCOREADY) IVPB 1750 mg/350 mL (0 mg Intravenous  Stopped 10/07/24 1915)  ceFEPIme  (MAXIPIME ) 2 g in sodium  chloride 0.9 % 100 mL IVPB (0 g Intravenous Stopped 10/07/24 1746)  acetaminophen  (TYLENOL ) tablet 650 mg (650 mg Oral Given 10/07/24 1908)    _______________________________________________________ ER Provider Called:       DrGOMEZ  They Recommend admit to medicine *** Will see in AM  ***SEEN in ER     ED Triage Vitals  Encounter Vitals Group     BP 10/07/24 1620 (!) 153/80     Girls Systolic BP Percentile --      Girls Diastolic BP Percentile --      Boys Systolic BP Percentile --      Boys Diastolic BP Percentile --      Pulse Rate 10/07/24 1620 (!) 130     Resp 10/07/24 1620 14     Temp 10/07/24 1620 (!) 100.7 F (38.2 C)     Temp Source 10/07/24 1620 Oral     SpO2 10/07/24 1620 96 %     Weight --      Height --      Head Circumference --      Peak Flow --      Pain Score 10/07/24 1600 10     Pain Loc --      Pain Education --      Exclude from Growth Chart --   UFJK(75)@     _________________________________________ Significant initial  Findings: Abnormal Labs Reviewed  COMPREHENSIVE METABOLIC PANEL WITH GFR - Abnormal; Notable for the following components:      Result Value   Glucose, Bld 164 (*)    Total Bilirubin 1.4 (*)    All other components within normal limits  CBC WITH DIFFERENTIAL/PLATELET - Abnormal; Notable for the following components:   WBC 17.4 (*)    RBC 3.10 (*)    Hemoglobin 9.9 (*)    HCT 30.8 (*)    Platelets 553 (*)    Neutro Abs 14.7 (*)    Abs Immature Granulocytes 0.19 (*)    All other components within normal limits  PROTIME-INR - Abnormal; Notable for the following components:   Prothrombin Time 16.9 (*)    INR 1.3 (*)    All other components within normal limits  URINALYSIS, W/ REFLEX TO CULTURE (INFECTION SUSPECTED) - Abnormal; Notable for the following components:   APPearance CLOUDY (*)    Hgb urine dipstick SMALL (*)    Ketones, ur 20 (*)    Protein, ur 30 (*)     Leukocytes,Ua SMALL (*)    Bacteria, UA MANY (*)    All other components within normal limits  I-STAT CG4 LACTIC ACID, ED - Abnormal; Notable for the following components:   Lactic Acid, Venous 2.7 (*)    All other components within normal limits      _________________________ Troponin ***ordered Cardiac Panel (last 3 results) No results for input(s): CKTOTAL, CKMB, TROPONINIHS, RELINDX in the last 72 hours.   ECG: Ordered Personally reviewed and interpreted by me showing: HR : *** Rhythm: *NSR, Sinus tachycardia * A.fib. W RVR, RBBB, LBBB, Paced Ischemic changes*nonspecific changes, no evidence of ischemic changes QTC*  BNP (last 3 results) Recent Labs    08/18/24 1500 09/18/24 0513 09/26/24 0624  BNP 44.3 51.9 57.7      No results for input(s): DDIMER, FERRITIN, LDH, CRP in the last 72 hours.    ____________________ This patient meets SIRS Criteria and may be septic. SIRS = Systemic Inflammatory Response Syndrome  Order a lactic acid level if needed AND/OR Initiate the sepsis  protocol with the attached order set OR Click Treating Associated Infection or Illness if the patient is being treated for an infection that is a known cause of these abnormalities     The recent clinical data is shown below. Vitals:   10/07/24 1907 10/07/24 2006 10/07/24 2007 10/07/24 2027  BP: (!) 97/51 (!) 111/58    Pulse: (!) 119  (!) 119   Resp: 14  15   Temp:    100.2 F (37.9 C)  TempSrc:      SpO2: 90%  100%         WBC     Component Value Date/Time   WBC 17.4 (H) 10/07/2024 1640   LYMPHSABS 1.5 10/07/2024 1640   MONOABS 0.9 10/07/2024 1640   EOSABS 0.1 10/07/2024 1640   BASOSABS 0.0 10/07/2024 1640        Lactic Acid, Venous    Component Value Date/Time   LATICACIDVEN 1.7 10/07/2024 1823     Lactic Acid, Venous    Component Value Date/Time   LATICACIDVEN 1.7 10/07/2024 1823    Procalcitonin *** Ordered      UA *** no evidence of UTI   ***Pending ***not ordered   Urine analysis:    Component Value Date/Time   COLORURINE YELLOW 10/07/2024 1757   APPEARANCEUR CLOUDY (A) 10/07/2024 1757   LABSPEC 1.018 10/07/2024 1757   PHURINE 6.0 10/07/2024 1757   GLUCOSEU NEGATIVE 10/07/2024 1757   HGBUR SMALL (A) 10/07/2024 1757   BILIRUBINUR NEGATIVE 10/07/2024 1757   KETONESUR 20 (A) 10/07/2024 1757   PROTEINUR 30 (A) 10/07/2024 1757   NITRITE NEGATIVE 10/07/2024 1757   LEUKOCYTESUR SMALL (A) 10/07/2024 1757    Results for orders placed or performed during the hospital encounter of 10/07/24  Resp panel by RT-PCR (RSV, Flu A&B, Covid) Anterior Nasal Swab     Status: None   Collection Time: 10/07/24  7:15 PM   Specimen: Anterior Nasal Swab  Result Value Ref Range Status   SARS Coronavirus 2 by RT PCR NEGATIVE NEGATIVE Final    Comment: (NOTE) SARS-CoV-2 target nucleic acids are NOT DETECTED.  The SARS-CoV-2 RNA is generally detectable in upper respiratory specimens during the acute phase of infection. The lowest concentration of SARS-CoV-2 viral copies this assay can detect is 138 copies/mL. A negative result does not preclude SARS-Cov-2 infection and should not be used as the sole basis for treatment or other patient management decisions. A negative result may occur with  improper specimen collection/handling, submission of specimen other than nasopharyngeal swab, presence of viral mutation(s) within the areas targeted by this assay, and inadequate number of viral copies(<138 copies/mL). A negative result must be combined with clinical observations, patient history, and epidemiological information. The expected result is Negative.  Fact Sheet for Patients:  bloggercourse.com  Fact Sheet for Healthcare Providers:  seriousbroker.it  This test is no t yet approved or cleared by the [place]  FDA and  has been authorized for detection and/or diagnosis of SARS-CoV-2  by FDA under an Emergency Use Authorization (EUA). This EUA will remain  in effect (meaning this test can be used) for the duration of the COVID-19 declaration under Section 564(b)(1) of the Act, 21 U.S.C.section 360bbb-3(b)(1), unless the authorization is terminated  or revoked sooner.       Influenza A by PCR NEGATIVE NEGATIVE Final   Influenza B by PCR NEGATIVE NEGATIVE Final    Comment: (NOTE) The Xpert Xpress SARS-CoV-2/FLU/RSV plus assay is intended as an aid in the diagnosis of  influenza from Nasopharyngeal swab specimens and should not be used as a sole basis for treatment. Nasal washings and aspirates are unacceptable for Xpert Xpress SARS-CoV-2/FLU/RSV testing.  Fact Sheet for Patients: bloggercourse.com  Fact Sheet for Healthcare Providers: seriousbroker.it  This test is not yet approved or cleared by the [place]  FDA and has been authorized for detection and/or diagnosis of SARS-CoV-2 by FDA under an Emergency Use Authorization (EUA). This EUA will remain in effect (meaning this test can be used) for the duration of the COVID-19 declaration under Section 564(b)(1) of the Act, 21 U.S.C. section 360bbb-3(b)(1), unless the authorization is terminated or revoked.     Resp Syncytial Virus by PCR NEGATIVE NEGATIVE Final    Comment: (NOTE) Fact Sheet for Patients: bloggercourse.com  Fact Sheet for Healthcare Providers: seriousbroker.it  This test is not yet approved or cleared by the [place]  FDA and has been authorized for detection and/or diagnosis of SARS-CoV-2 by FDA under an Emergency Use Authorization (EUA). This EUA will remain in effect (meaning this test can be used) for the duration of the COVID-19 declaration under Section 564(b)(1) of the Act, 21 U.S.C. section 360bbb-3(b)(1), unless the authorization is terminated or revoked.  Performed at  [facility], [address] [place], [place] [address]     ABX started Antibiotics Given (last 72 hours)     Date/Time Action Medication Dose Rate   10/07/24 1715 New Bag/Given   vancomycin  (VANCOREADY) IVPB 1750 mg/350 mL 1,750 mg 175 mL/hr   10/07/24 1716 New Bag/Given   ceFEPIme  (MAXIPIME ) 2 g in sodium chloride  0.9 % 100 mL IVPB 2 g 200 mL/hr        Susceptibility data from last 90 days. Collected Specimen Info Organism AMPICILLIN AMPICILLIN/SULBACTAM CEFAZOLIN  (NON-URINE) CEFEPIME  CEFTRIAXONE  Ciprofloxacin Ertapenem Gentamicin Susc lslt Meropenem Piperacillin + Tazobactam Trimethoprim/Sulfa  08/02/24 Blood from BLOOD RIGHT ARM Escherichia coli  S  S  S  S  S  S  S  S  S S  S     ________________________________________________________________  Arterial ***Venous  Blood Gas result:  pH *** pCO2 ***; pO2 ***;     %O2 Sat ***.  ABG    Component Value Date/Time   HCO3 26.9 09/18/2024 0519   TCO2 20 (L) 09/26/2024 0643   O2SAT 65 09/18/2024 0519       __________________________________________________________ Recent Labs  Lab 10/02/24 0452 10/07/24 1640  NA 140 136  K 4.1 4.0  CO2 28 22  GLUCOSE 136* 164*  BUN 19 17  CREATININE 1.10 0.96  CALCIUM  8.2* 9.3    Cr  * stable,  Up from baseline see below Lab Results  Component Value Date   CREATININE 0.96 10/07/2024   CREATININE 1.10 10/02/2024   CREATININE 1.40 (H) 09/30/2024    Recent Labs  Lab 10/07/24 1640  AST 23  ALT 26  ALKPHOS 69  BILITOT 1.4*  PROT 6.9  ALBUMIN  3.8   Lab Results  Component Value Date   CALCIUM  9.3 10/07/2024   PHOS 2.9 09/09/2024          Plt: Lab Results  Component Value Date   PLT 553 (H) 10/07/2024         Recent Labs  Lab 10/02/24 0452 10/07/24 1640  WBC 14.1* 17.4*  NEUTROABS 11.1* 14.7*  HGB 7.9* 9.9*  HCT 24.4* 30.8*  MCV 97.2 99.4  PLT 394 553*    HG/HCT * stable,  Down *Up from baseline see below    Component  Value  Date/Time   HGB 9.9 (L) 10/07/2024 1640   HCT 30.8 (L) 10/07/2024 1640   MCV 99.4 10/07/2024 1640      No results for input(s): LIPASE, AMYLASE in the last 168 hours. No results for input(s): AMMONIA in the last 168 hours.    _______________________________________________ Hospitalist was called for admission for *** COPD exacerbation (HCC) ***  Sepsis, due to unspecified organism, unspecified whether acute organ dysfunction present (HCC) ***    The following Work up has been ordered so far:  Orders Placed This Encounter  Procedures   Critical Care   Culture, blood (Routine x 2)   Urine Culture   Resp panel by RT-PCR (RSV, Flu A&B, Covid) Anterior Nasal Swab   DG Chest Port 1 View   CT Angio Chest PE W and/or Wo Contrast   Comprehensive metabolic panel   CBC with Differential   Protime-INR   Urinalysis, w/ Reflex to Culture (Infection Suspected) -Urine, Clean Catch   Notify physician (specify)  Specify: Notify provider for possible Code Sepsis   Document height and weight   Consult to hospitalist   I-Stat Lactic Acid, ED   EKG 12-Lead     OTHER Significant initial  Findings:  labs showing:     DM  labs:  HbA1C: Recent Labs    01/26/24 1337 06/12/24 1313 08/20/24 1102  HGBA1C 7.5* 6.1* 7.6*       CBG (last 3)  No results for input(s): GLUCAP in the last 72 hours.        Cultures:    Component Value Date/Time   SDES BLOOD LEFT HAND 09/26/2024 1930   SPECREQUEST  09/26/2024 1930    BOTTLES DRAWN AEROBIC AND ANAEROBIC Blood Culture adequate volume   CULT  09/26/2024 1930    NO GROWTH 5 DAYS Performed at [facility], [address], [place], [place] [address]    REPTSTATUS 10/01/2024 FINAL 09/26/2024 1930     Radiological Exams on Admission: DG Chest Port 1 View Result Date: 10/07/2024 EXAM: 1 VIEW(S) XRAY OF THE CHEST 10/07/2024 05:02:00 PM COMPARISON: 09/26/2024 CLINICAL HISTORY: Suspected sepsis FINDINGS: LUNGS AND PLEURA: No  focal pulmonary opacity. No pleural effusion. No pneumothorax. HEART AND MEDIASTINUM: Aortic atherosclerosis. No acute abnormality of the cardiac and mediastinal silhouettes. BONES AND SOFT TISSUES: Advanced degenerative changes of left shoulder. No acute osseous abnormality. IMPRESSION: 1. No acute cardiopulmonary abnormality. Electronically signed by: Morgane Naveau MD 10/07/2024 05:37 PM EST RP Workstation: [id number]   _______________________________________________________________________________________________________ Latest  Blood pressure (!) 111/58, pulse (!) 119, temperature 100.2 F (37.9 C), resp. rate 15, SpO2 100%.   Vitals  labs and radiology finding personally reviewed  Review of Systems:    Pertinent positives include: ***  Constitutional:  No weight loss, night sweats, Fevers, chills, fatigue, weight loss  HEENT:  No headaches, Difficulty swallowing,Tooth/dental problems,Sore throat,  No sneezing, itching, ear ache, nasal congestion, post nasal drip,  Cardio-vascular:  No chest pain, Orthopnea, PND, anasarca, dizziness, palpitations.no Bilateral lower extremity swelling  GI:  No heartburn, indigestion, abdominal pain, nausea, vomiting, diarrhea, change in bowel habits, loss of appetite, melena, blood in stool, hematemesis Resp:  no shortness of breath at rest. No dyspnea on exertion, No excess mucus, no productive cough, No non-productive cough, No coughing up of blood.No change in color of mucus.No wheezing. Skin:  no rash or lesions. No jaundice GU:  no dysuria, change in color of urine, no urgency or frequency. No straining to urinate.  No flank pain.  Musculoskeletal:  No joint pain or no joint swelling. No decreased range of motion. No back pain.  Psych:  No change in mood or affect. No depression or anxiety. No memory loss.  Neuro: no localizing neurological complaints, no tingling, no weakness, no double vision, no gait abnormality, no slurred speech, no  confusion  All systems reviewed and apart from HOPI all are negative _______________________________________________________________________________________________ Past Medical History:   Past Medical History:  Diagnosis Date   Acute dyspnea 03/24/2022   Acute exacerbation of chronic obstructive pulmonary disease (COPD) (HCC) 03/25/2022   Asthma    CAD (coronary artery disease) of artery bypass graft 06/13/2016   Chest pain    COPD (chronic obstructive pulmonary disease) (HCC)    COPD with acute exacerbation (HCC) 11/02/2020   Quit smokig 2019  - PFT's  121/22/23  FEV1 1.83 (60 % ) ratio 0.57  p 15 % improvement from saba p 0 prior to study with DLCO  16.4 (65%)   and FV curve mildly concave  - try off acei and on otc gerd rx 06/02/2023 >>>       Depression 09/01/2007   Qualifier: Diagnosis of  By: Nickola CMA, [place]     Elevated troponin 06/13/2016   History of lung cancer 11/02/2020   Hyperlipidemia 11/02/2020   Hypertension    Hypocalcemia 03/24/2022   Leukocytosis 08/05/2024   Prolonged QT interval 11/02/2020   SOB (shortness of breath) 06/12/2024   Stress-induced cardiomyopathy 06/13/2016   TOBACCO ABUSE 09/01/2007   Qualifier: Diagnosis of   By: Nickola CMA, [place]            Past Surgical History:  Procedure Laterality Date   BACK SURGERY     fusion in 1973   CARDIAC CATHETERIZATION N/A 06/13/2016   Procedure: Left Heart Cath and Coronary Angiography;  Surgeon: Lonni Hanson, MD;  Location: [facility] INVASIVE CV LAB;  Service: Cardiovascular;  Laterality: N/A;   COLONOSCOPY N/A 09/30/2024   Procedure: COLONOSCOPY;  Surgeon: Leigh Elspeth SQUIBB, MD;  Location: [facility] ENDOSCOPY;  Service: Gastroenterology;  Laterality: N/A;   ESOPHAGOGASTRODUODENOSCOPY N/A 09/30/2024   Procedure: EGD (ESOPHAGOGASTRODUODENOSCOPY);  Surgeon: Leigh Elspeth SQUIBB, MD;  Location: [facility] ENDOSCOPY;  Service: Gastroenterology;  Laterality: N/A;   HEMOSTASIS CLIP PLACEMENT  09/30/2024   Procedure:  CONTROL OF HEMORRHAGE, GI TRACT, ENDOSCOPIC, BY CLIPPING OR OVERSEWING;  Surgeon: Leigh Elspeth SQUIBB, MD;  Location: [facility] ENDOSCOPY;  Service: Gastroenterology;;   HOT HEMOSTASIS N/A 09/30/2024   Procedure: EGD, WITH ARGON PLASMA COAGULATION;  Surgeon: Leigh Elspeth SQUIBB, MD;  Location: [facility] ENDOSCOPY;  Service: Gastroenterology;  Laterality: N/A;   INGUINAL HERNIA REPAIR Right 04/21/2018   Procedure: RIGHT INGUINAL HERNIA REPAIR ERAS PATHWAY;  Surgeon: Vanderbilt Ned, MD;  Location: [facility] OR;  Service: General;  Laterality: Right;  TAP BLOCK   INSERTION OF MESH Right 04/21/2018   Procedure: INSERTION OF MESH;  Surgeon: Vanderbilt Ned, MD;  Location: [facility] OR;  Service: General;  Laterality: Right;   LUNG REMOVAL, PARTIAL Left    left upper lung removed about 6-7 years ago reported by pt   POLYPECTOMY  09/30/2024   Procedure: POLYPECTOMY, INTESTINE;  Surgeon: Leigh Elspeth SQUIBB, MD;  Location: [facility] ENDOSCOPY;  Service: Gastroenterology;;   TONSILLECTOMY     TOTAL HIP ARTHROPLASTY Left 08/27/2024   Procedure: ARTHROPLASTY, HIP, TOTAL, ANTERIOR APPROACH;  Surgeon: Jerri Kay HERO, MD;  Location: [facility] OR;  Service: Orthopedics;  Laterality: Left;    Social History:  Ambulatory *** independently cane, walker  wheelchair bound, bed  bound     reports that he quit smoking about 6 years ago. His smoking use included cigarettes. He smoked an average of 0.5 packs per day. He has never used smokeless tobacco. He reports that he does not drink alcohol and does not use drugs.     Family History: *** Family History  Problem Relation Age of Onset   Hypertension Mother    Hypertension Father    ______________________________________________________________________________________________ Allergies: Allergies[1]   Prior to Admission medications  Medication Sig Start Date End Date Taking? Authorizing Provider  acetaminophen  (TYLENOL ) 325 MG tablet Take 1-2 tablets (325-650 mg total) by mouth every 6  (six) hours as needed for mild pain (pain score 1-3) or fever (or temp > 100.5). 08/31/24   Sheikh, Omair Latif, DO  Albuterol -Budesonide  (AIRSUPRA ) 90-80 MCG/ACT AERO Inhale 2 puffs into the lungs 4 (four) times daily as needed. 09/09/24   Jesus Bernardino MATSU, MD  apixaban  (ELIQUIS ) 5 MG TABS tablet Take 1 tablet (5 mg total) by mouth 2 (two) times daily. 10/02/24   Samtani, Jai-Gurmukh, MD  atorvastatin  (LIPITOR) 40 MG tablet TAKE 1 TABLET(40 MG) BY MOUTH DAILY AT 6 PM 08/18/23   Pietro Redell RAMAN, MD  buPROPion  (WELLBUTRIN  XL) 300 MG 24 hr tablet Take 300 mg by mouth daily. 05/11/16   [provider]  citalopram  (CELEXA ) 20 MG tablet Take 1 tablet (20 mg total) by mouth daily. 10/02/24   Samtani, Jai-Gurmukh, MD  Dupilumab  (DUPIXENT ) 300 MG/2ML SOAJ Inject 300 mg into the skin every 14 (fourteen) days. 07/21/24   [name] Dorn NOVAK, MD  Fluticasone -Umeclidin-Vilant (TRELEGY ELLIPTA ) 200-62.5-25 MCG/ACT AEPB Inhale 1 puff into the lungs every evening. 09/09/24   Jesus Bernardino MATSU, MD  glipiZIDE  (GLUCOTROL  XL) 5 MG 24 hr tablet Take 1 tablet (5 mg total) by mouth 2 (two) times daily after a meal. 09/20/24   Lucius Krabbe, NP  HYDROcodone -acetaminophen  (NORCO/VICODIN) 5-325 MG tablet Take 2 tablets by mouth every 4 (four) hours as needed for up to 5 days for severe pain (pain score 7-10). 10/02/24 10/07/24  Samtani, Jai-Gurmukh, MD  ipratropium-albuterol  (DUONEB) 0.5-2.5 (3) MG/3ML SOLN Take 3 mLs by nebulization every 6 (six) hours as needed. 09/09/24   Jesus Bernardino MATSU, MD  metoprolol  succinate (TOPROL -XL) 25 MG 24 hr tablet Take 0.5 tablets (12.5 mg total) by mouth See admin instructions. Take 1/2 tablet (12.5mg ) by mouth once daily at noon. 08/06/24   Arlon Carliss ORN, DO  olmesartan  (BENICAR ) 40 MG tablet Take 1 tablet (40 mg total) by mouth daily. 06/29/24 06/29/25  Darlean Ozell NOVAK, MD  pantoprazole  (PROTONIX ) 40 MG tablet Take 1 tablet (40 mg total) by mouth 2 (two) times daily. 10/02/24    Samtani, Jai-Gurmukh, MD  triamcinolone  ointment (KENALOG ) 0.5 % Apply topically 2 (two) times daily. Patient taking differently: Apply 1 Application topically 2 (two) times daily as needed (skin irritaiton). 06/24/24   Lucius Krabbe, NP    ___________________________________________________________________________________________________ Physical Exam:    10/07/2024    8:07 PM 10/07/2024    8:06 PM 10/07/2024    7:07 PM  Vitals with BMI  Systolic  111 97  Diastolic  58 51  Pulse 119  119     1. General:  in No ***Acute distress***increased work of breathing ***complaining of severe pain****agitated * Chronically ill *well *cachectic *toxic acutely ill -appearing 2. Psychological: Alert and *** Oriented 3. Head/ENT:   Moist *** Dry Mucous Membranes  Head Non traumatic, neck supple                          Normal *** Poor Dentition 4. SKIN: normal *** decreased Skin turgor,  Skin clean Dry and intact no rash    5. Heart: Regular rate and rhythm no*** Murmur, no Rub or gallop 6. Lungs: ***Clear to auscultation bilaterally, no wheezes or crackles   7. Abdomen: Soft, ***non-tender, Non distended *** obese ***bowel sounds present 8. Lower extremities: no clubbing, cyanosis, no ***edema 9. Neurologically Grossly intact, moving all 4 extremities equally *** strength 5 out of 5 in all 4 extremities cranial nerves II through XII intact 10. MSK: Normal range of motion    Chart has been reviewed  ______________________________________________________________________________________________  Assessment/Plan  ***  Admitted for *** COPD exacerbation (HCC) ***  Sepsis, due to unspecified organism, unspecified whether acute organ dysfunction present (HCC) ***    Present on Admission: **None**     No problem-specific Assessment & Plan notes found for this encounter.    Other plan as per orders.  DVT prophylaxis:  SCD *** Lovenox        Code  Status:    Code Status: Prior FULL CODE *** DNR/DNI ***comfort care as per patient ***family  I had personally discussed CODE STATUS with patient and family*  ACP *** none has been reviewed ***   Family Communication:   Family not at  Bedside  plan of care was discussed on the phone with *** Son, Daughter, Wife, Husband, Sister, Brother , father, mother  Diet    Disposition Plan:   *** likely will need placement for rehabilitation                          Back to current facility when stable                            To home once workup is complete and patient is stable  ***Following barriers for discharge:                             Chest pain *** Stroke *** Syncope ***work up is complete                            Electrolytes corrected                               Anemia corrected h/H stable                             Pain controlled with PO medications                               Afebrile, white count improving able to transition to PO antibiotics                             Will need to be able to tolerate PO                            Will likely  need home health, home O2, set up                           Will need consultants to evaluate patient prior to discharge                           Work of breathing improves       Consult Orders  (From admission, onward)           Start     Ordered   10/07/24 2013  Consult to hospitalist  Once       Provider:  (Not yet assigned)  Question Answer Comment  Place call to: [facility]   Reason for Consult Admit      10/07/24 2012                              ***Would benefit from PT/OT eval prior to DC  Ordered                   Swallow eval - SLP ordered                   Diabetes care coordinator                   Transition of care consulted                   Nutrition    consulted                  Wound care  consulted                   Palliative care    consulted                   Behavioral health   consulted                    Consults called: ***  NONE   Admission status:  ED Disposition     ED Disposition  Admit   Condition  --   Comment  The patient appears reasonably stabilized for admission considering the current resources, flow, and capabilities available in the ED at this time, and I doubt any other [facility] requiring further screening and/or treatment in the ED prior to admission is  present.           Obs***  ***  inpatient     I Expect 2 midnight stay secondary to severity of patient's current illness need for inpatient interventions justified by the following: ***hemodynamic instability despite optimal treatment (tachycardia *hypotension * tachypnea *hypoxia, hypercapnia) *** Severe lab/radiological/exam abnormalities including:    COPD exacerbation (HCC) ***  Sepsis, due to unspecified organism, unspecified whether acute organ dysfunction present (HCC) ***  and extensive comorbidities including: *substance abuse  *Chronic pain *DM2  * CHF * CAD  * COPD/asthma *Morbid Obesity * CKD *dementia *liver disease *history of stroke with residual deficits *  malignancy, * sickle cell disease  History of amputation Chronic anticoagulation  That are currently affecting medical management.   I expect  patient to be hospitalized for 2 midnights requiring inpatient medical care.  Patient is at high risk for adverse outcome (such as loss of life or disability) if not treated.  Indication for inpatient stay as follows:  Severe change from baseline regarding mental status Hemodynamic instability despite maximal medical therapy,  severe pain requiring acute inpatient management,  inability to maintain oral hydration   persistent chest pain despite medical management Need for operative/procedural  intervention New or worsening hypoxia ongoing suicidal ideations   Need for IV antibiotics, IV fluids,, IV pain medications, IV anticoagulation,  IV rate  controling medications, IV antihypertensives need for biPAP Frequent labs    Level of care   *** tele  For 12H 24H     medical floor       progressive     stepdown   tele indefinitely please discontinue once patient no longer qualifies COVID-19 Labs   Thaddeus Evitts 10/07/2024, 8:36 PM ***  [facility]     after 2 AM please page floor coverage   If 7AM-7PM, please contact the day team taking care of the patient using [email or web address]        [1] No Known Allergies

## 2024-10-08 ENCOUNTER — Other Ambulatory Visit (HOSPITAL_COMMUNITY): Payer: Self-pay

## 2024-10-08 ENCOUNTER — Inpatient Hospital Stay (HOSPITAL_COMMUNITY)

## 2024-10-08 DIAGNOSIS — J441 Chronic obstructive pulmonary disease with (acute) exacerbation: Secondary | ICD-10-CM | POA: Diagnosis not present

## 2024-10-08 DIAGNOSIS — A419 Sepsis, unspecified organism: Secondary | ICD-10-CM | POA: Diagnosis present

## 2024-10-08 DIAGNOSIS — R8281 Pyuria: Secondary | ICD-10-CM | POA: Diagnosis present

## 2024-10-08 LAB — CBC
HCT: 26 % — ABNORMAL LOW (ref 39.0–52.0)
Hemoglobin: 8.2 g/dL — ABNORMAL LOW (ref 13.0–17.0)
MCH: 31.3 pg (ref 26.0–34.0)
MCHC: 31.5 g/dL (ref 30.0–36.0)
MCV: 99.2 fL (ref 80.0–100.0)
Platelets: 457 K/uL — ABNORMAL HIGH (ref 150–400)
RBC: 2.62 MIL/uL — ABNORMAL LOW (ref 4.22–5.81)
RDW: 15.1 % (ref 11.5–15.5)
WBC: 8.8 K/uL (ref 4.0–10.5)
nRBC: 0 % (ref 0.0–0.2)

## 2024-10-08 LAB — STREP PNEUMONIAE URINARY ANTIGEN: Strep Pneumo Urinary Antigen: NEGATIVE

## 2024-10-08 LAB — BLOOD GAS, VENOUS
Acid-base deficit: 4.1 mmol/L — ABNORMAL HIGH (ref 0.0–2.0)
Bicarbonate: 20.8 mmol/L (ref 20.0–28.0)
Drawn by: 69686
O2 Saturation: 59.8 %
Patient temperature: 36.9
pCO2, Ven: 36 mmHg — ABNORMAL LOW (ref 44–60)
pH, Ven: 7.37 (ref 7.25–7.43)
pO2, Ven: 35 mmHg (ref 32–45)

## 2024-10-08 LAB — COMPREHENSIVE METABOLIC PANEL WITH GFR
ALT: 20 U/L (ref 0–44)
AST: 17 U/L (ref 15–41)
Albumin: 3.3 g/dL — ABNORMAL LOW (ref 3.5–5.0)
Alkaline Phosphatase: 56 U/L (ref 38–126)
Anion gap: 11 (ref 5–15)
BUN: 17 mg/dL (ref 8–23)
CO2: 21 mmol/L — ABNORMAL LOW (ref 22–32)
Calcium: 8.5 mg/dL — ABNORMAL LOW (ref 8.9–10.3)
Chloride: 105 mmol/L (ref 98–111)
Creatinine, Ser: 0.82 mg/dL (ref 0.61–1.24)
GFR, Estimated: >60 mL/min
Glucose, Bld: 210 mg/dL — ABNORMAL HIGH (ref 70–99)
Potassium: 4.1 mmol/L (ref 3.5–5.1)
Sodium: 137 mmol/L (ref 135–145)
Total Bilirubin: 0.5 mg/dL (ref 0.0–1.2)
Total Protein: 5.5 g/dL — ABNORMAL LOW (ref 6.5–8.1)

## 2024-10-08 LAB — GLUCOSE, CAPILLARY
Glucose-Capillary: 198 mg/dL — ABNORMAL HIGH (ref 70–99)
Glucose-Capillary: 178 mg/dL — ABNORMAL HIGH (ref 70–99)
Glucose-Capillary: 173 mg/dL — ABNORMAL HIGH (ref 70–99)
Glucose-Capillary: 175 mg/dL — ABNORMAL HIGH (ref 70–99)
Glucose-Capillary: 254 mg/dL — ABNORMAL HIGH (ref 70–99)

## 2024-10-08 LAB — MRSA NEXT GEN BY PCR, NASAL: MRSA by PCR Next Gen: NOT DETECTED

## 2024-10-08 LAB — PHOSPHORUS: Phosphorus: 2.8 mg/dL (ref 2.5–4.6)

## 2024-10-08 LAB — MAGNESIUM: Magnesium: 2.1 mg/dL (ref 1.7–2.4)

## 2024-10-08 LAB — HIV ANTIBODY (ROUTINE TESTING W REFLEX): HIV Screen 4th Generation wRfx: NONREACTIVE

## 2024-10-08 LAB — HEMOGLOBIN A1C
Hgb A1c MFr Bld: 5.6 % (ref 4.8–5.6)
Mean Plasma Glucose: 114.02 mg/dL

## 2024-10-08 LAB — PROCALCITONIN: Procalcitonin: 0.19 ng/mL

## 2024-10-08 MED ORDER — ALBUTEROL SULFATE (2.5 MG/3ML) 0.083% IN NEBU
2.5000 mg | INHALATION_SOLUTION | RESPIRATORY_TRACT | Status: DC | PRN
  Administered 2024-10-09 – 2024-10-10 (×2): 2.5 mg via RESPIRATORY_TRACT
  Filled 2024-10-08 (×3): qty 3

## 2024-10-08 MED ORDER — ATORVASTATIN CALCIUM 40 MG PO TABS
40.0000 mg | ORAL_TABLET | Freq: Every day | ORAL | Status: DC
  Administered 2024-10-08 – 2024-10-09 (×2): 40 mg via ORAL
  Filled 2024-10-08 (×2): qty 1

## 2024-10-08 MED ORDER — APIXABAN 5 MG PO TABS
5.0000 mg | ORAL_TABLET | Freq: Two times a day (BID) | ORAL | Status: DC
  Administered 2024-10-08 – 2024-10-10 (×5): 5 mg via ORAL
  Filled 2024-10-08 (×5): qty 1

## 2024-10-08 MED ORDER — PANTOPRAZOLE SODIUM 40 MG PO TBEC
40.0000 mg | DELAYED_RELEASE_TABLET | Freq: Two times a day (BID) | ORAL | Status: DC
  Administered 2024-10-08 – 2024-10-09 (×4): 40 mg via ORAL
  Filled 2024-10-08 (×4): qty 1

## 2024-10-08 MED ORDER — METHYLPREDNISOLONE SODIUM SUCC 40 MG IJ SOLR
40.0000 mg | Freq: Two times a day (BID) | INTRAMUSCULAR | Status: AC
  Administered 2024-10-08 (×2): 40 mg via INTRAVENOUS
  Filled 2024-10-08 (×2): qty 1

## 2024-10-08 MED ORDER — BUDESON-GLYCOPYRROL-FORMOTEROL 160-9-4.8 MCG/ACT IN AERO
2.0000 | INHALATION_SPRAY | Freq: Two times a day (BID) | RESPIRATORY_TRACT | Status: DC
  Administered 2024-10-08 – 2024-10-09 (×3): 2 via RESPIRATORY_TRACT
  Filled 2024-10-08: qty 5.9

## 2024-10-08 MED ORDER — HYDROCODONE-ACETAMINOPHEN 5-325 MG PO TABS
1.0000 | ORAL_TABLET | ORAL | Status: DC | PRN
  Administered 2024-10-08 (×3): 2 via ORAL
  Administered 2024-10-08: 1 via ORAL
  Administered 2024-10-09 – 2024-10-10 (×6): 2 via ORAL
  Filled 2024-10-08 (×4): qty 2
  Filled 2024-10-08: qty 1
  Filled 2024-10-08 (×5): qty 2

## 2024-10-08 MED ORDER — ACETAMINOPHEN 325 MG PO TABS
650.0000 mg | ORAL_TABLET | Freq: Four times a day (QID) | ORAL | Status: DC | PRN
  Administered 2024-10-08: 650 mg via ORAL
  Filled 2024-10-08 (×2): qty 2

## 2024-10-08 MED ORDER — GUAIFENESIN ER 600 MG PO TB12
600.0000 mg | ORAL_TABLET | Freq: Two times a day (BID) | ORAL | Status: DC
  Administered 2024-10-08 – 2024-10-10 (×6): 600 mg via ORAL
  Filled 2024-10-08 (×6): qty 1

## 2024-10-08 MED ORDER — MIDODRINE HCL 5 MG PO TABS
5.0000 mg | ORAL_TABLET | Freq: Three times a day (TID) | ORAL | Status: DC
  Administered 2024-10-08 – 2024-10-09 (×6): 5 mg via ORAL
  Filled 2024-10-08 (×7): qty 1

## 2024-10-08 MED ORDER — PREDNISONE 20 MG PO TABS
40.0000 mg | ORAL_TABLET | Freq: Every day | ORAL | Status: DC
  Administered 2024-10-09 – 2024-10-10 (×2): 40 mg via ORAL
  Filled 2024-10-08 (×2): qty 2

## 2024-10-08 MED ORDER — IPRATROPIUM-ALBUTEROL 0.5-2.5 (3) MG/3ML IN SOLN
3.0000 mL | Freq: Four times a day (QID) | RESPIRATORY_TRACT | Status: DC
  Administered 2024-10-08 (×4): 3 mL via RESPIRATORY_TRACT
  Filled 2024-10-08 (×4): qty 3

## 2024-10-08 MED ORDER — SODIUM CHLORIDE 0.9 % IV SOLN
2.0000 g | INTRAVENOUS | Status: DC
  Administered 2024-10-08 – 2024-10-10 (×3): 2 g via INTRAVENOUS
  Filled 2024-10-08 (×3): qty 20

## 2024-10-08 MED ORDER — ACETAMINOPHEN 650 MG RE SUPP: 650.0000 mg | Freq: Four times a day (QID) | RECTAL | Status: DC | PRN

## 2024-10-08 MED ORDER — ONDANSETRON HCL 4 MG/2ML IJ SOLN
4.0000 mg | Freq: Four times a day (QID) | INTRAMUSCULAR | Status: DC | PRN
  Administered 2024-10-09: 4 mg via INTRAVENOUS
  Filled 2024-10-08: qty 2

## 2024-10-08 MED ORDER — BOOST PLUS PO LIQD: 237.0000 mL | ORAL | Status: DC

## 2024-10-08 MED ORDER — SODIUM CHLORIDE 0.9 % IV BOLUS
1000.0000 mL | Freq: Once | INTRAVENOUS | Status: AC
  Administered 2024-10-08: 1000 mL via INTRAVENOUS

## 2024-10-08 MED ORDER — SODIUM CHLORIDE 0.9 % IV SOLN
150.0000 mL/h | INTRAVENOUS | Status: AC
  Administered 2024-10-08 (×2): 150 mL/h via INTRAVENOUS

## 2024-10-08 MED ORDER — ONDANSETRON HCL 4 MG PO TABS: 4.0000 mg | ORAL_TABLET | Freq: Four times a day (QID) | ORAL | Status: DC | PRN

## 2024-10-08 MED ORDER — ENSURE PLUS HIGH PROTEIN PO LIQD
237.0000 mL | ORAL | Status: DC
  Administered 2024-10-08 – 2024-10-09 (×2): 237 mL via ORAL

## 2024-10-08 MED ORDER — SODIUM CHLORIDE 0.9 % IV SOLN
500.0000 mg | INTRAVENOUS | Status: DC
  Administered 2024-10-08 – 2024-10-09 (×2): 500 mg via INTRAVENOUS
  Filled 2024-10-08 (×2): qty 5

## 2024-10-08 NOTE — Assessment & Plan Note (Signed)
Follow-up with oncology as an outpatient 

## 2024-10-08 NOTE — Assessment & Plan Note (Signed)
 Order sliding scale hold p.o. medications

## 2024-10-08 NOTE — Assessment & Plan Note (Signed)
-   Continue Eliquis

## 2024-10-08 NOTE — TOC Initial Note (Signed)
 Transition of Care [facility]) - Initial/Assessment Note    Patient Details  Name: Gary Wright MRN: [id number] Date of Birth: 11/21/1945  [facility] CM/SW Contact:    Tawni CHRISTELLA Eva, LCSW Phone Number: 10/08/2024, 4:12 PM  Clinical Narrative:                  CSW received consult for Medication assistance. Pt does not qualify as he is insured. CSW spoke with pt's wife , pt is from home and plan is to return. Pt has walker,can and neb machine. Pt is currently not on O2 at home. ICM will follow for home O2 needs. Pt spouse reports she will provided transportation upon d/c. ICM to follow.   Expected Discharge Plan: Home/Self Care Barriers to Discharge: Continued Medical Work up   Patient Goals and CMS Choice Patient states their goals for this hospitalization and ongoing recovery are:: return home          Expected Discharge Plan and Services                                              Prior Living Arrangements/Services     Patient language and need for interpreter reviewed:: Yes Do you feel safe going back to the place where you live?: Yes      Need for Family Participation in Patient Care: Yes (Comment) Care giver support system in place?: Yes (comment)   Criminal Activity/Legal Involvement Pertinent to Current Situation/Hospitalization: No - Comment as needed  Activities of Daily Living   ADL Screening (condition at time of admission) Independently performs ADLs?: Yes (appropriate for developmental age) Is the patient deaf or have difficulty hearing?: No Does the patient have difficulty seeing, even when wearing glasses/contacts?: No Does the patient have difficulty concentrating, remembering, or making decisions?: No  Permission Sought/Granted                  Emotional Assessment Appearance:: Appears stated age     Orientation: : Oriented to Self      Admission diagnosis:  COPD exacerbation (HCC)  [J44.1] Sepsis, due to unspecified organism, unspecified whether acute organ dysfunction present [facility]) [A41.9] Patient Active Problem List   Diagnosis Date Noted   Pyuria 10/08/2024   Sepsis (HCC) 10/08/2024   AVM (arteriovenous malformation) of small bowel, acquired 09/30/2024   Benign neoplasm of colon 09/30/2024   Heme positive stool 09/29/2024   Anticoagulated 09/29/2024   COPD exacerbation (HCC) 09/26/2024   Acute respiratory distress 09/26/2024   History of pulmonary embolism 09/26/2024   Anemia 09/26/2024   Diastolic dysfunction 09/26/2024   Physical deconditioning 09/26/2024   Acute pulmonary embolism (HCC) 09/20/2024   Avascular necrosis of bone of left hip (HCC) 08/20/2024   Leukocytosis 08/05/2024   History of bacteremia 08/05/2024   CAP (community acquired pneumonia) 08/02/2024   Chronic hip pain, bilateral 01/26/2024   Hoarseness 12/22/2023   Drug-induced diabetes mellitus 11/26/2023   Insomnia due to medical condition 11/26/2023   Fungal skin infection 11/04/2023   Asthma-COPD overlap syndrome (HCC) 11/04/2023   Chronic kidney disease, stage 3b (HCC) 07/31/2023   Allergic asthma 07/28/2023   Allergic rhinitis 07/17/2023   Former smoker 07/17/2023   Borderline type 2 diabetes mellitus 03/24/2022   COPD with acute exacerbation (HCC) 11/02/2020   History of lung cancer 11/02/2020   Prolonged QT interval 11/02/2020  Hyperlipidemia 11/02/2020   Acute respiratory failure with hypoxia (HCC) 11/02/2020   Stress-induced cardiomyopathy 06/13/2016   History of pneumonia 06/13/2016   Opioid dependence (HCC) 06/13/2016   Mild CAD 06/13/2016   Depression 09/01/2007   Essential hypertension 09/01/2007   TONSILLECTOMY, HX OF 09/01/2007   PCP:  Lucius Krabbe, NP Pharmacy:   [facility] [phone] [place], [place] - [address] AT [facility] [address] & [facility] [address] [place] [address] Phone: [phone] Fax: [phone]  [place]  [facility] [address] [place] [address] Phone: [phone] Fax: [phone]  [facility] - [place], [place] - [address] [place] [address] Phone: [phone] Fax: [phone]  [facility] [address] [place] - [address] [place]  [address] Phone: [phone] Fax: [phone]  [facility] [place] [address] [place] [address] Phone: [phone] Fax: [phone]  [facility] [address] [place] [address] Phone: [phone] Fax: [phone]     Social Drivers of Health (SDOH) Social History: SDOH Screenings   Food Insecurity: No Food Insecurity (10/07/2024)  Housing: Low Risk (10/07/2024)  Transportation Needs: No Transportation Needs (10/07/2024)  Utilities: Not At Risk (10/07/2024)  Alcohol Screen: Low Risk (11/18/2023)  Depression (PHQ2-9): Medium Risk (11/18/2023)  Financial Resource Strain: Low Risk (11/18/2023)  Physical Activity: Insufficiently Active (11/18/2023)  Social Connections: Moderately Isolated (10/07/2024)  Stress: No Stress Concern Present (11/18/2023)  Tobacco Use: Medium Risk (10/07/2024)  Health Literacy: Adequate Health Literacy (11/18/2023)   SDOH Interventions:     Readmission Risk Interventions    09/28/2024    4:25 PM  Readmission Risk Prevention Plan  Transportation Screening Complete  Medication Review (RN Care Manager) Complete  PCP or Specialist appointment within 3-5 days of discharge Complete  HRI or Home Care Consult Complete  SW Recovery Care/Counseling Consult Complete  Palliative Care Screening Not Applicable  Skilled Nursing Facility Not Applicable

## 2024-10-08 NOTE — Assessment & Plan Note (Signed)
Continue Lipitor 40 mg a day

## 2024-10-08 NOTE — Assessment & Plan Note (Addendum)
 Patient currently denies being on O2 at home but clearly has oxygen requirement here  Acute respiratory failure likely in the setting of COPD exacerbation Chest CT showing chronic PE no new PEs that is possible infiltrates Procalcitonin has been obtained patient is on Rocephin  await results of sputum check Legionella and strep pneumo

## 2024-10-08 NOTE — Assessment & Plan Note (Addendum)
"   Allow permissive hypertension given soft blood pressure "

## 2024-10-08 NOTE — Assessment & Plan Note (Signed)
 Patient did not endorse dysuria to me UA does show evidence of pyuria and many bacteria given patient generalized malaise he may benefit from treatment he is already on Rocephin  for his COPD standpoint we will continue await results of urine cultures

## 2024-10-08 NOTE — Assessment & Plan Note (Addendum)
 With chronic hip pain.  Patient states the pain is persistent despite having hip replacement.  Pain medications provided Obtain plain films for now May benefit from orthopedics consult in a.m. since patient still has persistent pain despite this being addressed if they would recommend any additional imaging

## 2024-10-08 NOTE — Assessment & Plan Note (Signed)
-  chronic avoid nephrotoxic medications such as NSAIDs, Vanco Zosyn combo,  avoid hypotension, continue to follow renal function

## 2024-10-08 NOTE — Progress Notes (Signed)
 " PROGRESS NOTE    Gary Wright  FMW:[id number] DOB: [birth year] DOA: 10/07/2024 PCP: Lucius Krabbe, NP    Brief Narrative:  78 year old with history of COPD, lung cancer status post resection, chronic systolic heart failure, Takotsubo cardiomyopathy with improved EF, left hip replacement, history of GI bleed, history of PE on Eliquis  presented with shortness of breath for 3 days.  Recently admitted 11/19 with pulmonary embolism complicated by GI bleed now on Eliquis  Admitted and discharged again on 12/13 for COPD exacerbation In the emergency room patient complaining of left hip pain, patient insist he has pneumonia. On 2 L oxygen, temperature 101.3, WBC count 17.4. CT angiogram of the chest with known chronic pulmonary embolism.  Has some ground glass opacities bilateral lungs.  Emphysematous lungs.  No consolidation. Hip x-ray stable.  Admitted with IV antibiotics and steroids.  Subjective: Patient seen and examined.  Patient tells me I have pneumonia.  He is also complaining of not having enough pain medicine at home for left hip pain.    Assessment & Plan:   COPD with acute exacerbation, hypoxemia: Remains in the hospital because of severity of symptoms. Aggressive bronchodilator therapy, IV steroids, inhalational steroids, scheduled and as needed bronchodilators, deep breathing exercises, incentive spirometry, chest physiotherapy. PT OT. COVID, flu and RSV negative. MRSA swab, negative Check a sputum cultures. Given low-grade fever and recurrent hospitalization, will treat with antibiotics.  Continue Rocephin  azithromycin  today. Supplemental oxygen to keep saturation more than 90%.  History of PE: Stable.  Tolerating Eliquis .  Anemia of chronic disease: Recently transfused.  Baseline hemoglobin about 8-9.  Continue to monitor.  Bilateral hip pain: Recent left hip replacement, right hip osteoarthritis.  Will encourage oral pain medications.  CKD stage IIIb:  At about baseline.  Essential hypertension: Blood pressure is stable on Toprol .    DVT prophylaxis:  apixaban  (ELIQUIS ) tablet 5 mg   Code Status: Full code Family Communication: None at the bedside Disposition Plan: Status is: Inpatient Remains inpatient appropriate because: Significant symptoms, IV antibiotics     Consultants:  None  Procedures:  None  Antimicrobials:  Rocephin  azithromycin  12/18---     Objective: Vitals:   10/07/24 2200 10/07/24 2233 10/08/24 0233 10/08/24 0558  BP: 118/62 (!) 96/53 (!) 112/59 117/71  Pulse: (!) 114 (!) 110 96 98  Resp: 17 20 18 20   Temp:  98.4 F (36.9 C) 97.7 F (36.5 C) 97.6 F (36.4 C)  TempSrc:  Oral  Oral  SpO2: 99% 99% 100% 97%  Weight:  78.8 kg    Height:  5' 8 (1.727 m)      Intake/Output Summary (Last 24 hours) at 10/08/2024 0728 Last data filed at 10/08/2024 0400 Gross per 24 hour  Intake 2854.12 ml  Output 350 ml  Net 2504.12 ml   Filed Weights   10/07/24 2233  Weight: 78.8 kg    Examination:  General exam: Appears calm and comfortable.  Chronically sick looking.  Slightly anxious.  Looks comfortable otherwise. Respiratory system: Mostly conducted upper airway sounds.  Not in respiratory distress. Cardiovascular system: S1 & S2 heard, RRR.  Gastrointestinal system: Abdomen is nondistended, soft and nontender. No organomegaly or masses felt. Normal bowel sounds heard. Central nervous system: Alert and oriented. No focal neurological deficits.     Data Reviewed: I have personally reviewed following labs and imaging studies  CBC: Recent Labs  Lab 10/02/24 0452 10/07/24 1640 10/08/24 0425  WBC 14.1* 17.4* 8.8  NEUTROABS 11.1* 14.7*  --   HGB  7.9* 9.9* 8.2*  HCT 24.4* 30.8* 26.0*  MCV 97.2 99.4 99.2  PLT 394 553* 457*   Basic Metabolic Panel: Recent Labs  Lab 10/02/24 0452 10/07/24 1640 10/08/24 0425  NA 140 136 137  K 4.1 4.0 4.1  CL 104 100 105  CO2 28 22 21*  GLUCOSE 136* 164*  210*  BUN 19 17 17   CREATININE 1.10 0.96 0.82  CALCIUM  8.2* 9.3 8.5*  MG  --   --  2.1  PHOS  --   --  2.8   GFR: Estimated Creatinine Clearance: 71.8 mL/min (by C-G formula based on SCr of 0.82 mg/dL). Liver Function Tests: Recent Labs  Lab 10/07/24 1640 10/08/24 0425  AST 23 17  ALT 26 20  ALKPHOS 69 56  BILITOT 1.4* 0.5  PROT 6.9 5.5*  ALBUMIN  3.8 3.3*   No results for input(s): LIPASE, AMYLASE in the last 168 hours. No results for input(s): AMMONIA in the last 168 hours. Coagulation Profile: Recent Labs  Lab 10/07/24 1640  INR 1.3*   Cardiac Enzymes: No results for input(s): CKTOTAL, CKMB, CKMBINDEX, TROPONINI in the last 168 hours. BNP (last 3 results) No results for input(s): PROBNP in the last 8760 hours. HbA1C: Recent Labs    10/07/24 1640  HGBA1C 5.6   CBG: Recent Labs  Lab 10/01/24 1953 10/02/24 0740 10/07/24 2120 10/07/24 2304 10/08/24 0407  GLUCAP 267* 166* 187* 202* 198*   Lipid Profile: No results for input(s): CHOL, HDL, LDLCALC, TRIG, CHOLHDL, LDLDIRECT in the last 72 hours. Thyroid  Function Tests: No results for input(s): TSH, T4TOTAL, FREET4, T3FREE, THYROIDAB in the last 72 hours. Anemia Panel: No results for input(s): VITAMINB12, FOLATE, FERRITIN, TIBC, IRON, RETICCTPCT in the last 72 hours. Sepsis Labs: Recent Labs  Lab 10/07/24 1638 10/07/24 1823 10/08/24 0120  PROCALCITON  --   --  0.19  LATICACIDVEN 2.7* 1.7  --     Recent Results (from the past 240 hours)  Culture, blood (Routine x 2)     Status: None (Preliminary result)   Collection Time: 10/07/24  4:45 PM   Specimen: BLOOD LEFT ARM  Result Value Ref Range Status   Specimen Description   Final    BLOOD LEFT ARM Performed at [facility], [address], [place], [place] [address]    Special Requests   Final    BOTTLES DRAWN AEROBIC ONLY Blood Culture adequate volume Performed at [facility],  [address] [place], [place] [address]    Culture PENDING  Incomplete   Report Status PENDING  Incomplete  Resp panel by RT-PCR (RSV, Flu A&B, Covid) Anterior Nasal Swab     Status: None   Collection Time: 10/07/24  7:15 PM   Specimen: Anterior Nasal Swab  Result Value Ref Range Status   SARS Coronavirus 2 by RT PCR NEGATIVE NEGATIVE Final    Comment: (NOTE) SARS-CoV-2 target nucleic acids are NOT DETECTED.  The SARS-CoV-2 RNA is generally detectable in upper respiratory specimens during the acute phase of infection. The lowest concentration of SARS-CoV-2 viral copies this assay can detect is 138 copies/mL. A negative result does not preclude SARS-Cov-2 infection and should not be used as the sole basis for treatment or other patient management decisions. A negative result may occur with  improper specimen collection/handling, submission of specimen other than nasopharyngeal swab, presence of viral mutation(s) within the areas targeted by this assay, and inadequate number of viral copies(<138 copies/mL). A negative result must be combined with  clinical observations, patient history, and epidemiological information. The expected result is Negative.  Fact Sheet for Patients:  bloggercourse.com  Fact Sheet for Healthcare Providers:  seriousbroker.it  This test is no t yet approved or cleared by the [place]  FDA and  has been authorized for detection and/or diagnosis of SARS-CoV-2 by FDA under an Emergency Use Authorization (EUA). This EUA will remain  in effect (meaning this test can be used) for the duration of the COVID-19 declaration under Section 564(b)(1) of the Act, 21 U.S.C.section 360bbb-3(b)(1), unless the authorization is terminated  or revoked sooner.       Influenza A by PCR NEGATIVE NEGATIVE Final   Influenza B by PCR NEGATIVE NEGATIVE Final    Comment: (NOTE) The Xpert Xpress SARS-CoV-2/FLU/RSV plus  assay is intended as an aid in the diagnosis of influenza from Nasopharyngeal swab specimens and should not be used as a sole basis for treatment. Nasal washings and aspirates are unacceptable for Xpert Xpress SARS-CoV-2/FLU/RSV testing.  Fact Sheet for Patients: bloggercourse.com  Fact Sheet for Healthcare Providers: seriousbroker.it  This test is not yet approved or cleared by the [place]  FDA and has been authorized for detection and/or diagnosis of SARS-CoV-2 by FDA under an Emergency Use Authorization (EUA). This EUA will remain in effect (meaning this test can be used) for the duration of the COVID-19 declaration under Section 564(b)(1) of the Act, 21 U.S.C. section 360bbb-3(b)(1), unless the authorization is terminated or revoked.     Resp Syncytial Virus by PCR NEGATIVE NEGATIVE Final    Comment: (NOTE) Fact Sheet for Patients: bloggercourse.com  Fact Sheet for Healthcare Providers: seriousbroker.it  This test is not yet approved or cleared by the [place]  FDA and has been authorized for detection and/or diagnosis of SARS-CoV-2 by FDA under an Emergency Use Authorization (EUA). This EUA will remain in effect (meaning this test can be used) for the duration of the COVID-19 declaration under Section 564(b)(1) of the Act, 21 U.S.C. section 360bbb-3(b)(1), unless the authorization is terminated or revoked.  Performed at [facility], [address], [place], [place] [address]   MRSA Next Gen by PCR, Nasal     Status: None   Collection Time: 10/08/24  3:00 AM   Specimen: Nasal Mucosa; Nasal Swab  Result Value Ref Range Status   MRSA by PCR Next Gen NOT DETECTED NOT DETECTED Final    Comment: (NOTE) The GeneXpert MRSA Assay (FDA approved for NASAL specimens only), is one component of a comprehensive MRSA colonization surveillance program. It  is not intended to diagnose MRSA infection nor to guide or monitor treatment for MRSA infections. Test performance is not FDA approved in patients less than 89 years old. Performed at [facility], [address] [place], [place] [address]          Radiology Studies: DG HIP PORT UNILAT WITH PELVIS 1V LEFT Result Date: 10/08/2024 EXAM: 1 VIEW(S) XRAY OF THE LEFT HIP 10/08/2024 01:34:00 AM COMPARISON: None available. CLINICAL HISTORY: Left hip pain. FINDINGS: BONES AND JOINTS: Left total hip arthroplasty in place. Severe degenerative changes of the right hip. SOFT TISSUES: Vascular calcifications. Residual contrast within the bladder. IMPRESSION: 1. Left total hip arthroplasty in place. 2. Severe degenerative changes of the right hip. Electronically signed by: Oneil Devonshire MD 10/08/2024 02:07 AM EST RP Workstation: [id number]   CT Angio Chest PE W and/or Wo Contrast Result Date: 10/07/2024 EXAM: CTA CHEST 10/07/2024 09:18:02 PM TECHNIQUE: CTA of the chest was performed without  and with the administration of 75 mL of iohexol  (OMNIPAQUE ) 350 MG/ML injection. Multiplanar reformatted images are provided for review. MIP images are provided for review. Automated exposure control, iterative reconstruction, and/or weight based adjustment of the mA/kV was utilized to reduce the radiation dose to as low as reasonably achievable. COMPARISON: CT of the chest 100 mg 09/26/2024. CLINICAL HISTORY: Pulmonary embolism (PE) suspected, high prob. FINDINGS: PULMONARY ARTERIES: Pulmonary arteries are adequately opacified for evaluation. There is a single small segmental right lower lobe pulmonary embolism which appears unchanged. All other pulmonary emboli have resolved in the interval. No new pulmonary emboli are seen. Main pulmonary artery is normal in caliber. MEDIASTINUM: The heart and pericardium demonstrate no acute abnormality. There is no acute abnormality of the thoracic aorta. LYMPH NODES:  No mediastinal, hilar or axillary lymphadenopathy. LUNGS AND PLEURA: Mild to moderate emphysema present. Mild ground glass opacities within the bilateral lower lobes and right upper lobe. There is a calcified granuloma in the left upper lobe. No focal consolidation or pulmonary edema. No evidence of pleural effusion or pneumothorax. UPPER ABDOMEN: There is a small hiatal hernia. SOFT TISSUES AND BONES: Anterior chest calcifications. No acute bone or soft tissue abnormality. IMPRESSION: 1. Single small segmental right lower lobe pulmonary embolism, unchanged from prior study. All other pulmonary emboli have resolved in the interval. No new pulmonary emboli. 2. Mild ground glass opacities within the bilateral lower lobes and right upper lobe may represent mild edema or infectious/inflammatory process . 3. Mild to moderate emphysema. Pulmonary emphysema is an independent risk factor for lung cancer. Recommend consideration for evaluation for a low-dose CT lung cancer screening program. 4. Calcified granuloma in the left upper lobe. 5. Small hiatal hernia. Electronically signed by: Greig Pique MD 10/07/2024 09:32 PM EST RP Workstation: [id number]   DG Chest Port 1 View Result Date: 10/07/2024 EXAM: 1 VIEW(S) XRAY OF THE CHEST 10/07/2024 05:02:00 PM COMPARISON: 09/26/2024 CLINICAL HISTORY: Suspected sepsis FINDINGS: LUNGS AND PLEURA: No focal pulmonary opacity. No pleural effusion. No pneumothorax. HEART AND MEDIASTINUM: Aortic atherosclerosis. No acute abnormality of the cardiac and mediastinal silhouettes. BONES AND SOFT TISSUES: Advanced degenerative changes of left shoulder. No acute osseous abnormality. IMPRESSION: 1. No acute cardiopulmonary abnormality. Electronically signed by: Morgane Naveau MD 10/07/2024 05:37 PM EST RP Workstation: [id number]        Scheduled Meds:  apixaban   5 mg Oral BID   atorvastatin   40 mg Oral Daily   budesonide -glycopyrrolate -formoterol   2 puff Inhalation BID    guaiFENesin   600 mg Oral BID    HYDROmorphone  (DILAUDID ) injection  0.5 mg Intravenous Once   insulin  aspart  0-9 Units Subcutaneous Q4H   ipratropium-albuterol   3 mL Nebulization QID   methylPREDNISolone  (SOLU-MEDROL ) injection  40 mg Intravenous Q12H   Followed by   NOREEN ON 10/09/2024] predniSONE   40 mg Oral Q breakfast   midodrine   5 mg Oral TID WC   pantoprazole   40 mg Oral BID   Continuous Infusions:  sodium chloride  Stopped (10/08/24 0237)   azithromycin  500 mg (10/08/24 0258)   cefTRIAXone  (ROCEPHIN )  IV 2 g (10/08/24 0222)     LOS: 1 day    Time spent: 51 minutes    Renato Applebaum, MD [facility]   "

## 2024-10-08 NOTE — Progress Notes (Signed)
 Initial Nutrition Assessment  DOCUMENTATION CODES:   Non-severe (moderate) malnutrition in context of chronic illness  INTERVENTION:  - Liberalize diet to  Regular to provide the widest variety of menu choices and to avoid restricting intake.  - Ensure Plus High Protein po daily, provides 350 kcal and 20 grams of protein. - Monitor weight trends.  NUTRITION DIAGNOSIS:   Moderate Malnutrition related to chronic illness (COPD; HF) as evidenced by moderate fat depletion, moderate muscle depletion.   GOAL:   Patient will meet greater than or equal to 90% of their needs  MONITOR:   PO intake, Supplement acceptance, Weight trends  REASON FOR ASSESSMENT:   Consult Assessment of nutrition requirement/status  ASSESSMENT:   78 y o. Male with PMH of COPD, lung cancer s/p resection, chronic systolic heart failure, left hip replacement who presented with shortness of breath for 3 days. Admitted for COPD exacerbation.   Patient reports a UBW of 183# and feels his weight has been stable recently. Per EMR, patient weighed at 186# in October and now weighed at 173#. This is a 13# or 7% weight loss in 2 months, which is borderline significant.   Patient endorses typically eating 3 meals a day at home and was eating normally up until admission.   Current appetite is good and patient wanting to order lunch at time of visit. Ordered patient his desired meal of a chicken caesar salad with orange sherbet and a diet ginger ale.   Encouraged intake of 3 meals a day during admission. Will liberalize diet to Regular to avoid restricting intake and add Ensure once daily to support intake.     Medications reviewed and include: -  Labs reviewed:  HA1C 5.6   NUTRITION - FOCUSED PHYSICAL EXAM:  Flowsheet Row Most Recent Value  Orbital Region Moderate depletion  Upper Arm Region Severe depletion  Thoracic and Lumbar Region Mild depletion  Buccal Region No depletion  Temple Region Severe  depletion  Clavicle Bone Region Moderate depletion  Clavicle and Acromion Bone Region Moderate depletion  Scapular Bone Region Unable to assess  Dorsal Hand No depletion  Patellar Region No depletion  Anterior Thigh Region No depletion  Posterior Calf Region No depletion  Edema (RD Assessment) None  Hair Reviewed  Eyes Reviewed  Mouth Reviewed  Skin Reviewed  Nails Reviewed    Diet Order:   Diet Order             Diet regular Room service appropriate? Yes; Fluid consistency: Thin  Diet effective now                   EDUCATION NEEDS:  Education needs have been addressed  Skin:  Skin Assessment: Reviewed RN Assessment  Last BM:  12/17  Height:  Ht Readings from Last 1 Encounters:  10/07/24 5' 8 (1.727 m)   Weight:  Wt Readings from Last 1 Encounters:  10/07/24 78.8 kg    BMI:  Body mass index is 26.41 kg/m.  Estimated Nutritional Needs:  Kcal:  1800-1950 kcals Protein:  80-95 grams Fluid:  >/= 1.8L    Trude Ned RD, LDN Contact via Secure Chat.

## 2024-10-08 NOTE — Plan of Care (Signed)
" °  Problem: Respiratory: Goal: Ability to maintain adequate ventilation will improve Outcome: Progressing   Problem: Safety: Goal: Ability to remain free from injury will improve Outcome: Progressing   Problem: Elimination: Goal: Will not experience complications related to urinary retention Outcome: Progressing   Problem: Elimination: Goal: Will not experience complications related to bowel motility Outcome: Progressing   "

## 2024-10-08 NOTE — Assessment & Plan Note (Signed)
" -  SIRS criteria met with  elevated white blood cell count,       Component Value Date/Time   WBC 17.4 (H) 10/07/2024 1640   LYMPHSABS 1.5 10/07/2024 1640     Tachycardia   RR >20 Today's Vitals   10/07/24 2145 10/07/24 2200 10/07/24 2233 10/07/24 2339  BP: 114/64 118/62 (!) 96/53   Pulse: (!) 117 (!) 114 (!) 110   Resp: 20 17 20    Temp:   98.4 F (36.9 C)   TempSrc:   Oral   SpO2: 100% 99% 99%   Weight:   78.8 kg   Height:   5' 8 (1.727 m)   PainSc:    10-Worst pain ever     -Most likely source being:  urinary, pulmonary,    Patient meeting criteria for Severe sepsis with    evidence of end organ damage/organ dysfunction such as    Acute hypoxia requiring new supplemental oxygen, SpO2: 99 % O2 Flow Rate (L/min): 4 L/min    - Obtain serial lactic acid and procalcitonin level.  - Initiated IV antibiotics in ER: Antibiotics Given (last 72 hours)     Date/Time Action Medication Dose Rate   10/07/24 1715 New Bag/Given   vancomycin  (VANCOREADY) IVPB 1750 mg/350 mL 1,750 mg 175 mL/hr   10/07/24 1716 New Bag/Given   ceFEPIme  (MAXIPIME ) 2 g in sodium chloride  0.9 % 100 mL IVPB 2 g 200 mL/hr       Will continue  on : Rocephin  azithromycin    - await results of blood and urine culture  - Rehydrate  Intravenous fluids were administered,    1:19 AM  "

## 2024-10-08 NOTE — Assessment & Plan Note (Signed)
 CT showing possible infiltrates Given worsening shortness of breath and cough Patient started on Rocephin  will add azithromycin  Await results of respiratory panel Sputum cultures ordered Legionella and strep pneumo as well as MRSA

## 2024-10-08 NOTE — Assessment & Plan Note (Signed)
 Chronic stable Fuses needed for hemoglobin below 7

## 2024-10-08 NOTE — Assessment & Plan Note (Signed)
PT OT evaluation prior to discharge 

## 2024-10-09 ENCOUNTER — Inpatient Hospital Stay (HOSPITAL_COMMUNITY)

## 2024-10-09 DIAGNOSIS — J441 Chronic obstructive pulmonary disease with (acute) exacerbation: Secondary | ICD-10-CM | POA: Diagnosis not present

## 2024-10-09 DIAGNOSIS — E440 Moderate protein-calorie malnutrition: Secondary | ICD-10-CM | POA: Insufficient documentation

## 2024-10-09 LAB — GLUCOSE, CAPILLARY
Glucose-Capillary: 113 mg/dL — ABNORMAL HIGH (ref 70–99)
Glucose-Capillary: 159 mg/dL — ABNORMAL HIGH (ref 70–99)
Glucose-Capillary: 188 mg/dL — ABNORMAL HIGH (ref 70–99)
Glucose-Capillary: 197 mg/dL — ABNORMAL HIGH (ref 70–99)
Glucose-Capillary: 206 mg/dL — ABNORMAL HIGH (ref 70–99)
Glucose-Capillary: 230 mg/dL — ABNORMAL HIGH (ref 70–99)
Glucose-Capillary: 256 mg/dL — ABNORMAL HIGH (ref 70–99)

## 2024-10-09 LAB — URINE CULTURE: Culture: >=100000 — AB

## 2024-10-09 MED ORDER — PANTOPRAZOLE SODIUM 40 MG PO TBEC
40.0000 mg | DELAYED_RELEASE_TABLET | Freq: Two times a day (BID) | ORAL | Status: DC
  Administered 2024-10-09 – 2024-10-10 (×2): 40 mg via ORAL
  Filled 2024-10-09 (×2): qty 1

## 2024-10-09 MED ORDER — LIDOCAINE 5 % EX PTCH
1.0000 | MEDICATED_PATCH | Freq: Every day | CUTANEOUS | Status: DC
  Administered 2024-10-09 – 2024-10-10 (×2): 1 via TRANSDERMAL
  Filled 2024-10-09 (×2): qty 1

## 2024-10-09 MED ORDER — GABAPENTIN 100 MG PO CAPS
100.0000 mg | ORAL_CAPSULE | Freq: Two times a day (BID) | ORAL | Status: DC
  Administered 2024-10-09 – 2024-10-10 (×3): 100 mg via ORAL
  Filled 2024-10-09 (×3): qty 1

## 2024-10-09 MED ORDER — FENTANYL CITRATE (PF) 50 MCG/ML IJ SOSY
25.0000 ug | PREFILLED_SYRINGE | Freq: Once | INTRAMUSCULAR | Status: AC
  Administered 2024-10-09: 25 ug via INTRAVENOUS
  Filled 2024-10-09: qty 1

## 2024-10-09 MED ORDER — IPRATROPIUM-ALBUTEROL 0.5-2.5 (3) MG/3ML IN SOLN
3.0000 mL | Freq: Four times a day (QID) | RESPIRATORY_TRACT | Status: DC
  Administered 2024-10-09 – 2024-10-10 (×3): 3 mL via RESPIRATORY_TRACT
  Filled 2024-10-09 (×3): qty 3

## 2024-10-09 MED ORDER — AZITHROMYCIN 250 MG PO TABS
500.0000 mg | ORAL_TABLET | Freq: Every day | ORAL | Status: DC
  Administered 2024-10-10: 500 mg via ORAL
  Filled 2024-10-09: qty 2

## 2024-10-09 MED ORDER — METHOCARBAMOL 500 MG PO TABS
500.0000 mg | ORAL_TABLET | Freq: Three times a day (TID) | ORAL | Status: DC
  Administered 2024-10-09 – 2024-10-10 (×3): 500 mg via ORAL
  Filled 2024-10-09 (×3): qty 1

## 2024-10-09 NOTE — Plan of Care (Signed)
  Problem: Education: Goal: Ability to describe self-care measures that may prevent or decrease complications (Diabetes Survival Skills Education) will improve Outcome: Progressing   Problem: Education: Goal: Individualized Educational Video(s) Outcome: Progressing   Problem: Coping: Goal: Ability to adjust to condition or change in health will improve Outcome: Progressing   Problem: Fluid Volume: Goal: Ability to maintain a balanced intake and output will improve Outcome: Progressing   Problem: Health Behavior/Discharge Planning: Goal: Ability to identify and utilize available resources and services will improve Outcome: Progressing

## 2024-10-09 NOTE — Progress Notes (Signed)
 Per notes, RN attempted to remove nine (9) sutures from left hip. Unfortunately, there was significant tissue growth around each suture. Two (2) sutures remain within the skin and were unable to be removed. Attending MD made aware. Skin was cleansed with NS. Triple antibiotic ointment was applied. IV Fentanyl  25 mcg given for pain management.

## 2024-10-09 NOTE — Progress Notes (Signed)
 " PROGRESS NOTE    Gary Wright  FMW:[id number] DOB: 21-Jun-1946 DOA: 10/07/2024 PCP: Lucius Krabbe, NP    Brief Narrative:  78 year old with history of COPD, lung cancer status post resection, chronic systolic heart failure, Takotsubo cardiomyopathy with improved EF, left hip replacement, history of GI bleed, history of PE on Eliquis  presented with shortness of breath for 3 days.  Recently admitted 11/19 with pulmonary embolism complicated by GI bleed now on Eliquis  Admitted and discharged again on 12/13 for COPD exacerbation In the emergency room patient complaining of left hip pain, patient insist he has pneumonia. On 2 L oxygen, temperature 101.3, WBC count 17.4. CT angiogram of the chest with known chronic pulmonary embolism.  Has some ground glass opacities bilateral lungs.  Emphysematous lungs.  No consolidation. Hip x-ray stable.  Admitted with IV antibiotics and steroids.  Subjective:  Patient seen and examined.  His breathing is better.  Remains afebrile.  He has more concerned about his left hip pain and difficulty walking.  Out of bed today to the chair.  On 2 L oxygen.  Will need home oxygen assessment. Patient had surgery total hip on 11/7, hospitalized twice since the surgery, still has skin sutures present.  I discussed this with his orthopedic surgeon and will remove the sutures.  Will try multimodal pain medication regimen to help him mobility.   Assessment & Plan:   COPD with acute exacerbation, hypoxemia: Continue bronchodilator therapy, Breztri  ,deep breathing exercises, incentive spirometry, chest physiotherapy. PT OT. COVID, flu and RSV negative. MRSA swab, negative Continue Rocephin  and azithromycin  today. Supplemental oxygen to keep saturation more than 90%.  May need to go home with oxygen.  Will do assessment today.  History of PE: Stable.  Tolerating Eliquis .  Anemia of chronic disease: Recently transfused.  Baseline hemoglobin about 8-9.   Continue to monitor.  Bilateral hip pain:  Right hip with advanced osteoarthritic changes.   Left hip avascular necrosis is status post total hip replacement 11/7.  Still with pain.   Updated orthopedics.  Remove sutures.   Inadequate pain control.  Must have chronic pain issues. Norco 5/325 every 4 hours as needed. Robaxin  500 mg 3 times daily Gabapentin  100 mg twice daily Local lidocaine  patch Mobility with PT OT. Ortho will schedule follow-up.   CKD stage IIIb: At about baseline.  Essential hypertension: Blood pressure is stable on Toprol .    DVT prophylaxis:  apixaban  (ELIQUIS ) tablet 5 mg   Code Status: Full code Family Communication: None at the bedside Disposition Plan: Status is: Inpatient Remains inpatient appropriate because: Significant symptoms, IV antibiotics     Consultants:  None  Procedures:  None  Antimicrobials:  Rocephin  azithromycin  12/18---     Objective: Vitals:   10/08/24 1932 10/08/24 2030 10/09/24 0416 10/09/24 1032  BP:  (!) 104/54 101/60   Pulse:  (!) 102 82   Resp:  20 19   Temp:  98 F (36.7 C) 97.9 F (36.6 C)   TempSrc:  Oral    SpO2: 98% 92% 97% 97%  Weight:      Height:        Intake/Output Summary (Last 24 hours) at 10/09/2024 1135 Last data filed at 10/09/2024 0340 Gross per 24 hour  Intake 2129.72 ml  Output --  Net 2129.72 ml   Filed Weights   10/07/24 2233  Weight: 78.8 kg    Examination:  General exam: Fairly comfortable after getting pain medications today. Respiratory system: Mostly conducted upper airway sounds.  On 2 L oxygen. Cardiovascular system: S1 & S2 heard, RRR.  Gastrointestinal system: Abdomen is nondistended, soft and nontender. No organomegaly or masses felt. Normal bowel sounds heard. Central nervous system: Alert and oriented. No focal neurological deficits. Left hip, anterior surgical incision intact and dry. Skin sutures present, some of the sutures are embedded in the skin. Full  range of motion intact.     Data Reviewed: I have personally reviewed following labs and imaging studies  CBC: Recent Labs  Lab 10/07/24 1640 10/08/24 0425  WBC 17.4* 8.8  NEUTROABS 14.7*  --   HGB 9.9* 8.2*  HCT 30.8* 26.0*  MCV 99.4 99.2  PLT 553* 457*   Basic Metabolic Panel: Recent Labs  Lab 10/07/24 1640 10/08/24 0425  NA 136 137  K 4.0 4.1  CL 100 105  CO2 22 21*  GLUCOSE 164* 210*  BUN 17 17  CREATININE 0.96 0.82  CALCIUM  9.3 8.5*  MG  --  2.1  PHOS  --  2.8   GFR: Estimated Creatinine Clearance: 71.8 mL/min (by C-G formula based on SCr of 0.82 mg/dL). Liver Function Tests: Recent Labs  Lab 10/07/24 1640 10/08/24 0425  AST 23 17  ALT 26 20  ALKPHOS 69 56  BILITOT 1.4* 0.5  PROT 6.9 5.5*  ALBUMIN  3.8 3.3*   No results for input(s): LIPASE, AMYLASE in the last 168 hours. No results for input(s): AMMONIA in the last 168 hours. Coagulation Profile: Recent Labs  Lab 10/07/24 1640  INR 1.3*   Cardiac Enzymes: No results for input(s): CKTOTAL, CKMB, CKMBINDEX, TROPONINI in the last 168 hours. BNP (last 3 results) No results for input(s): PROBNP in the last 8760 hours. HbA1C: Recent Labs    10/07/24 1640  HGBA1C 5.6   CBG: Recent Labs  Lab 10/08/24 1654 10/08/24 2027 10/09/24 0012 10/09/24 0411 10/09/24 0807  GLUCAP 175* 254* 230* 188* 113*   Lipid Profile: No results for input(s): CHOL, HDL, LDLCALC, TRIG, CHOLHDL, LDLDIRECT in the last 72 hours. Thyroid  Function Tests: No results for input(s): TSH, T4TOTAL, FREET4, T3FREE, THYROIDAB in the last 72 hours. Anemia Panel: No results for input(s): VITAMINB12, FOLATE, FERRITIN, TIBC, IRON, RETICCTPCT in the last 72 hours. Sepsis Labs: Recent Labs  Lab 10/07/24 1638 10/07/24 1823 10/08/24 0120  PROCALCITON  --   --  0.19  LATICACIDVEN 2.7* 1.7  --     Recent Results (from the past 240 hours)  Culture, blood (Routine x 2)      Status: None (Preliminary result)   Collection Time: 10/07/24  4:45 PM   Specimen: BLOOD  Result Value Ref Range Status   Specimen Description   Final    BLOOD LEFT ANTECUBITAL Performed at [facility], [address] [place], [place] [address]    Special Requests   Final    BOTTLES DRAWN AEROBIC AND ANAEROBIC Blood Culture adequate volume Performed at [facility], [address], [place], [place] [address]    Culture   Final    NO GROWTH 2 DAYS Performed at [facility], [address] [place], [place] [address]    Report Status PENDING  Incomplete  Culture, blood (Routine x 2)     Status: None (Preliminary result)   Collection Time: 10/07/24  4:45 PM   Specimen: BLOOD LEFT ARM  Result Value Ref Range Status   Specimen Description   Final    BLOOD LEFT ARM Performed at [facility], [address],  [place], [place] [address]    Special Requests   Final    BOTTLES DRAWN AEROBIC ONLY Blood Culture adequate volume Performed at [facility], [address], [place], [place] [address]    Culture   Final    NO GROWTH 2 DAYS Performed at [facility], [address] [place], [place] [address]    Report Status PENDING  Incomplete  Urine Culture     Status: Abnormal (Preliminary result)   Collection Time: 10/07/24  5:57 PM   Specimen: Urine, Random  Result Value Ref Range Status   Specimen Description   Final    URINE, RANDOM Performed at [facility], [address] [place], [place] [address]    Special Requests   Final    NONE Reflexed from [id number] Performed at [facility], [address] [place], [place] [address]    Culture (A)  Final    >=100,000 COLONIES/mL GRAM NEGATIVE RODS IDENTIFICATION AND SUSCEPTIBILITIES TO FOLLOW Performed at [facility], [address], [place], [place] [address]    Report Status PENDING  Incomplete  Resp panel by  RT-PCR (RSV, Flu A&B, Covid) Anterior Nasal Swab     Status: None   Collection Time: 10/07/24  7:15 PM   Specimen: Anterior Nasal Swab  Result Value Ref Range Status   SARS Coronavirus 2 by RT PCR NEGATIVE NEGATIVE Final    Comment: (NOTE) SARS-CoV-2 target nucleic acids are NOT DETECTED.  The SARS-CoV-2 RNA is generally detectable in upper respiratory specimens during the acute phase of infection. The lowest concentration of SARS-CoV-2 viral copies this assay can detect is 138 copies/mL. A negative result does not preclude SARS-Cov-2 infection and should not be used as the sole basis for treatment or other patient management decisions. A negative result may occur with  improper specimen collection/handling, submission of specimen other than nasopharyngeal swab, presence of viral mutation(s) within the areas targeted by this assay, and inadequate number of viral copies(<138 copies/mL). A negative result must be combined with clinical observations, patient history, and epidemiological information. The expected result is Negative.  Fact Sheet for Patients:  bloggercourse.com  Fact Sheet for Healthcare Providers:  seriousbroker.it  This test is no t yet approved or cleared by the [place]  FDA and  has been authorized for detection and/or diagnosis of SARS-CoV-2 by FDA under an Emergency Use Authorization (EUA). This EUA will remain  in effect (meaning this test can be used) for the duration of the COVID-19 declaration under Section 564(b)(1) of the Act, 21 U.S.C.section 360bbb-3(b)(1), unless the authorization is terminated  or revoked sooner.       Influenza A by PCR NEGATIVE NEGATIVE Final   Influenza B by PCR NEGATIVE NEGATIVE Final    Comment: (NOTE) The Xpert Xpress SARS-CoV-2/FLU/RSV plus assay is intended as an aid in the diagnosis of influenza from Nasopharyngeal swab specimens and should not be used as a sole basis  for treatment. Nasal washings and aspirates are unacceptable for Xpert Xpress SARS-CoV-2/FLU/RSV testing.  Fact Sheet for Patients: bloggercourse.com  Fact Sheet for Healthcare Providers: seriousbroker.it  This test is not yet approved or cleared by the [place]  FDA and has been authorized for detection and/or diagnosis of SARS-CoV-2 by FDA under an Emergency Use Authorization (EUA). This EUA will remain in effect (meaning this test can be used) for the duration of the COVID-19 declaration under Section 564(b)(1) of the Act, 21 U.S.C. section 360bbb-3(b)(1), unless the authorization is  terminated or revoked.     Resp Syncytial Virus by PCR NEGATIVE NEGATIVE Final    Comment: (NOTE) Fact Sheet for Patients: bloggercourse.com  Fact Sheet for Healthcare Providers: seriousbroker.it  This test is not yet approved or cleared by the [place]  FDA and has been authorized for detection and/or diagnosis of SARS-CoV-2 by FDA under an Emergency Use Authorization (EUA). This EUA will remain in effect (meaning this test can be used) for the duration of the COVID-19 declaration under Section 564(b)(1) of the Act, 21 U.S.C. section 360bbb-3(b)(1), unless the authorization is terminated or revoked.  Performed at [facility], [address], [place], [place] [address]   MRSA Next Gen by PCR, Nasal     Status: None   Collection Time: 10/08/24  3:00 AM   Specimen: Nasal Mucosa; Nasal Swab  Result Value Ref Range Status   MRSA by PCR Next Gen NOT DETECTED NOT DETECTED Final    Comment: (NOTE) The GeneXpert MRSA Assay (FDA approved for NASAL specimens only), is one component of a comprehensive MRSA colonization surveillance program. It is not intended to diagnose MRSA infection nor to guide or monitor treatment for MRSA infections. Test performance is not FDA  approved in patients less than 10 years old. Performed at [facility], [address], [place], [place] [address]          Radiology Studies: DG HIP PORT UNILAT WITH PELVIS 1V LEFT Result Date: 10/08/2024 EXAM: 1 VIEW(S) XRAY OF THE LEFT HIP 10/08/2024 01:34:00 AM COMPARISON: None available. CLINICAL HISTORY: Left hip pain. FINDINGS: BONES AND JOINTS: Left total hip arthroplasty in place. Severe degenerative changes of the right hip. SOFT TISSUES: Vascular calcifications. Residual contrast within the bladder. IMPRESSION: 1. Left total hip arthroplasty in place. 2. Severe degenerative changes of the right hip. Electronically signed by: Oneil Devonshire MD 10/08/2024 02:07 AM EST RP Workstation: [id number]   CT Angio Chest PE W and/or Wo Contrast Result Date: 10/07/2024 EXAM: CTA CHEST 10/07/2024 09:18:02 PM TECHNIQUE: CTA of the chest was performed without and with the administration of 75 mL of iohexol  (OMNIPAQUE ) 350 MG/ML injection. Multiplanar reformatted images are provided for review. MIP images are provided for review. Automated exposure control, iterative reconstruction, and/or weight based adjustment of the mA/kV was utilized to reduce the radiation dose to as low as reasonably achievable. COMPARISON: CT of the chest 100 mg 09/26/2024. CLINICAL HISTORY: Pulmonary embolism (PE) suspected, high prob. FINDINGS: PULMONARY ARTERIES: Pulmonary arteries are adequately opacified for evaluation. There is a single small segmental right lower lobe pulmonary embolism which appears unchanged. All other pulmonary emboli have resolved in the interval. No new pulmonary emboli are seen. Main pulmonary artery is normal in caliber. MEDIASTINUM: The heart and pericardium demonstrate no acute abnormality. There is no acute abnormality of the thoracic aorta. LYMPH NODES: No mediastinal, hilar or axillary lymphadenopathy. LUNGS AND PLEURA: Mild to moderate emphysema present. Mild ground glass  opacities within the bilateral lower lobes and right upper lobe. There is a calcified granuloma in the left upper lobe. No focal consolidation or pulmonary edema. No evidence of pleural effusion or pneumothorax. UPPER ABDOMEN: There is a small hiatal hernia. SOFT TISSUES AND BONES: Anterior chest calcifications. No acute bone or soft tissue abnormality. IMPRESSION: 1. Single small segmental right lower lobe pulmonary embolism, unchanged from prior study. All other pulmonary emboli have resolved in the interval. No new pulmonary emboli. 2. Mild ground glass opacities within the bilateral lower lobes and right upper lobe  may represent mild edema or infectious/inflammatory process . 3. Mild to moderate emphysema. Pulmonary emphysema is an independent risk factor for lung cancer. Recommend consideration for evaluation for a low-dose CT lung cancer screening program. 4. Calcified granuloma in the left upper lobe. 5. Small hiatal hernia. Electronically signed by: Greig Pique MD 10/07/2024 09:32 PM EST RP Workstation: [id number]   DG Chest Port 1 View Result Date: 10/07/2024 EXAM: 1 VIEW(S) XRAY OF THE CHEST 10/07/2024 05:02:00 PM COMPARISON: 09/26/2024 CLINICAL HISTORY: Suspected sepsis FINDINGS: LUNGS AND PLEURA: No focal pulmonary opacity. No pleural effusion. No pneumothorax. HEART AND MEDIASTINUM: Aortic atherosclerosis. No acute abnormality of the cardiac and mediastinal silhouettes. BONES AND SOFT TISSUES: Advanced degenerative changes of left shoulder. No acute osseous abnormality. IMPRESSION: 1. No acute cardiopulmonary abnormality. Electronically signed by: Morgane Naveau MD 10/07/2024 05:37 PM EST RP Workstation: [id number]        Scheduled Meds:  apixaban   5 mg Oral BID   atorvastatin   40 mg Oral Daily   [START ON 10/10/2024] azithromycin   500 mg Oral Daily   budesonide -glycopyrrolate -formoterol   2 puff Inhalation BID   feeding supplement  237 mL Oral Q24H   fentaNYL  (SUBLIMAZE ) injection  25  mcg Intravenous Once   gabapentin   100 mg Oral BID   guaiFENesin   600 mg Oral BID    HYDROmorphone  (DILAUDID ) injection  0.5 mg Intravenous Once   insulin  aspart  0-9 Units Subcutaneous Q4H   ipratropium-albuterol   3 mL Nebulization Q6H   lidocaine   1 patch Transdermal Daily   methocarbamol   500 mg Oral Q8H   midodrine   5 mg Oral TID WC   pantoprazole   40 mg Oral BID   predniSONE   40 mg Oral Q breakfast   Continuous Infusions:  cefTRIAXone  (ROCEPHIN )  IV 2 g (10/09/24 0308)     LOS: 2 days    Time spent: 51 minutes    Renato Applebaum, MD [facility]   "

## 2024-10-09 NOTE — Evaluation (Signed)
 Physical Therapy Evaluation Patient Details Name: Gary Wright MRN: [id number] DOB: [birth year] Today's Date: 10/09/2024  History of Present Illness  Patient is 78 y.o. male admitted 10/07/2024 with shortness of breath. He had a recent hospital admission 12/07/22025 due to shortness of breath as well. PMH: stage IV COPD, not on home oxygen, history of acute GI bleeding in the setting of Eliquis  use, still on Eliquis , history of pulmonary embolism diagnosed last month, Avascular necrosis left hip sp replacement.  Clinical Impression  Patient admitted with the above diagnosis. He was in the recliner upon arrival and agreeable to participate in session. He was on 2L of O2 but he does not use O2 at baseline. Require Min A to complete sit to stand transfer from recliner. While ambulating patient required max verbal cues for pursed lip breathing. Patient was short of breath and demonstrated some wheezing. His fatigue was the main limiting factor for ambulation and not his left hip pain. He lives with his wife with four stairs to enter the single story home. Before recent admissions he was independent with bathing and dressing and ambulated with a front wheeled walker. Patient would benefit from continued skilled services due to a recent decline in function.      If plan is discharge home, recommend the following: Assistance with cooking/housework;A little help with bathing/dressing/bathroom;Help with stairs or ramp for entrance   Can travel by private vehicle        Equipment Recommendations None recommended by PT  Recommendations for Other Services       Functional Status Assessment Patient has had a recent decline in their functional status and demonstrates the ability to make significant improvements in function in a reasonable and predictable amount of time.     Precautions / Restrictions Precautions Precautions: Fall Precaution/Restrictions Comments: O2 stats Restrictions Weight  Bearing Restrictions Per Provider Order: No      Mobility  Bed Mobility               General bed mobility comments: Bed mobility not assessed. Patient seated in recliner upon arrival.    Transfers Overall transfer level: Needs assistance Equipment used: Rolling walker (2 wheels) Transfers: Sit to/from Stand Sit to Stand: Contact guard assist           General transfer comment: CGA ; verbal cues for correct hand placement    Ambulation/Gait Ambulation/Gait assistance: Contact guard assist, +2 safety/equipment Gait Distance (Feet): 20 Feet Assistive device: Rolling walker (2 wheels) Gait Pattern/deviations: Step-through pattern, Decreased stride length, Trunk flexed Gait velocity: decreased     General Gait Details: Patient's distance limited by fatigue. O2 stats dropped to 85% while on 2L of O2. Requried cues for pursed lip breathing due to dyspnea and wheezing.Patient did not verbalize any increase in left hip pain while ambulating. Required two people to help manage equip,emt.  Stairs            Wheelchair Mobility     Tilt Bed    Modified Rankin (Stroke Patients Only)       Balance Overall balance assessment: Needs assistance Sitting-balance support: Feet supported, Bilateral upper extremity supported Sitting balance-Leahy Scale: Good     Standing balance support: Reliant on assistive device for balance Standing balance-Leahy Scale: Fair                               Pertinent Vitals/Pain Pain Assessment Pain Assessment: 0-10 Pain Score: 2  Faces Pain Scale:  (left hip) Breathing: occasional labored breathing, short period of hyperventilation (wheezing noted after ambulation) Pain Descriptors / Indicators: Aching, Discomfort Pain Intervention(s): Repositioned, Limited activity within patient's tolerance, Monitored during session    Home Living Family/patient expects to be discharged to:: Private residence Living  Arrangements: Spouse/significant other Available Help at Discharge: Family Type of Home: House Home Access: Stairs to enter Entrance Stairs-Rails: Can reach both Entrance Stairs-Number of Steps: 4   Home Layout: One level Home Equipment: Agricultural Consultant (2 wheels);Shower seat      Prior Function Prior Level of Function : Independent/Modified Independent             Mobility Comments: Used RW for ambulation; no falls withing the last 6 months ADLs Comments: No help with showering or getting dressed     Extremity/Trunk Assessment                Communication   Communication Communication: No apparent difficulties    Cognition Arousal: Alert Behavior During Therapy: WFL for tasks assessed/performed   PT - Cognitive impairments: No apparent impairments                         Following commands: Intact       Cueing Cueing Techniques: Verbal cues     General Comments      Exercises General Exercises - Lower Extremity Gluteal Sets: AROM, Seated, 10 reps, Both Long Arc Quad: AROM, Seated, Both, 10 reps Toe Raises: AROM, Both, 10 reps, Seated Heel Raises: AROM, Both, 10 reps, Seated   Assessment/Plan    PT Assessment Patient needs continued PT services  PT Problem List Decreased mobility;Cardiopulmonary status limiting activity;Decreased balance;Decreased activity tolerance       PT Treatment Interventions DME instruction;Therapeutic exercise;Gait training;Stair training;Therapeutic activities;Patient/family education;Functional mobility training;Balance training    PT Goals (Current goals can be found in the Care Plan section)  Acute Rehab PT Goals Patient Stated Goal: to get stronger and walk PT Goal Formulation: With patient Time For Goal Achievement: 10/22/24 Potential to Achieve Goals: Good    Frequency Min 3X/week     Co-evaluation               AM-PAC PT 6 Clicks Mobility  Outcome Measure Help needed turning from your  back to your side while in a flat bed without using bedrails?: A Little Help needed moving from lying on your back to sitting on the side of a flat bed without using bedrails?: A Little Help needed moving to and from a bed to a chair (including a wheelchair)?: A Little Help needed standing up from a chair using your arms (e.g., wheelchair or bedside chair)?: A Little Help needed to walk in hospital room?: A Little Help needed climbing 3-5 steps with a railing? : A Lot 6 Click Score: 17    End of Session Equipment Utilized During Treatment: Gait belt Activity Tolerance: Patient limited by fatigue;Patient tolerated treatment well Patient left: in chair;with chair alarm set;with call bell/phone within reach Nurse Communication: Mobility status;Other (comment) (Fatigue with transfers and ambulation) PT Visit Diagnosis: Other abnormalities of gait and mobility (R26.89);Unsteadiness on feet (R26.81)    Time: 9147-9079 PT Time Calculation (min) (ACUTE ONLY): 28 min   Charges:   PT Evaluation $PT Eval Moderate Complexity: 1 Mod PT Treatments $Therapeutic Activity: 8-22 mins PT General Charges $$ ACUTE PT VISIT: 1 Visit         Kristeen Sar, PT, DPT  10/09/2024 10:56 AM   Kristeen Sar 10/09/2024, 10:55 AM

## 2024-10-09 NOTE — Evaluation (Signed)
 Occupational Therapy Evaluation Patient Details Name: Gary Wright MRN: [id number] DOB: 03-19-1946 Today's Date: 10/09/2024   History of Present Illness   Patient is 78 y.o. male admitted 10/07/2024 with shortness of breath. He had a recent hospital admission 12/07/22025 due to shortness of breath as well. PMH: stage IV COPD, not on home oxygen, history of acute GI bleeding in the setting of Eliquis  use, still on Eliquis , history of pulmonary embolism diagnosed last month, Avascular necrosis left hip sp replacement.     Clinical Impressions Pt presents with decline in function and safety with ADLs and ADL mobility with impaired strength, balance and endurance; pt limited by L hip pain. PTA pt lives with his wife and was recently d/c from [facility] rehab. Pt reports that he was Mod I - Ind with ADLs/selfcare, uses a RW for mobility. Pt currently requires Sup to sit EOB, mod A with LB ADLs, mod/min A with toileting tasks and min A /CGA STS and with mobility using RW. OT will follow acutely to maximize level of function and safety     If plan is discharge home, recommend the following:   A lot of help with bathing/dressing/bathroom;A little help with walking and/or transfers;Assist for transportation;Help with stairs or ramp for entrance     Functional Status Assessment   Patient has had a recent decline in their functional status and demonstrates the ability to make significant improvements in function in a reasonable and predictable amount of time.     Equipment Recommendations   None recommended by OT     Recommendations for Other Services         Precautions/Restrictions   Precautions Precautions: Fall Recall of Precautions/Restrictions: Intact Restrictions Weight Bearing Restrictions Per Provider Order: No     Mobility Bed Mobility Overal bed mobility: Needs Assistance Bed Mobility: Supine to Sit, Sit to Supine     Supine to sit: Supervision Sit to supine:  Supervision        Transfers Overall transfer level: Needs assistance Equipment used: Rolling walker (2 wheels) Transfers: Sit to/from Stand Sit to Stand: Contact guard assist           General transfer comment: cues for correct hand placement      Balance Overall balance assessment: Needs assistance Sitting-balance support: Feet supported, Bilateral upper extremity supported Sitting balance-Leahy Scale: Good     Standing balance support: Reliant on assistive device for balance Standing balance-Leahy Scale: Fair                             ADL either performed or assessed with clinical judgement   ADL Overall ADL's : Needs assistance/impaired Eating/Feeding: Independent   Grooming: Wash/dry hands;Wash/dry face;Supervision/safety;Set up;Sitting   Upper Body Bathing: Supervision/ safety;Set up;Sitting   Lower Body Bathing: Moderate assistance   Upper Body Dressing : Supervision/safety;Set up   Lower Body Dressing: Moderate assistance   Toilet Transfer: Minimal assistance;Contact guard assist   Toileting- Clothing Manipulation and Hygiene: Moderate assistance       Functional mobility during ADLs: Minimal assistance;Contact guard assist;Cueing for safety;Cueing for sequencing       Vision Baseline Vision/History: 1 Wears glasses Ability to See in Adequate Light: 0 Adequate Patient Visual Report: No change from baseline       Perception         Praxis         Pertinent Vitals/Pain Pain Assessment Pain Assessment: 0-10 Pain Score: 8  Pain  Location: L hip with mobility Pain Descriptors / Indicators: Aching, Discomfort Pain Intervention(s): Limited activity within patient's tolerance, Monitored during session, Premedicated before session, Repositioned     Extremity/Trunk Assessment             Communication Communication Communication: No apparent difficulties   Cognition Arousal: Alert Behavior During Therapy: WFL for tasks  assessed/performed                                 Following commands: Intact       Cueing  General Comments   Cueing Techniques: Verbal cues      Exercises     Shoulder Instructions      Home Living Family/patient expects to be discharged to:: Private residence Living Arrangements: Spouse/significant other Available Help at Discharge: Family Type of Home: House Home Access: Stairs to enter Secretary/administrator of Steps: 4 Entrance Stairs-Rails: Can reach both Home Layout: One level     Bathroom Shower/Tub: Chief Strategy Officer: Standard     Home Equipment: Agricultural Consultant (2 wheels);Shower seat          Prior Functioning/Environment Prior Level of Function : Independent/Modified Independent             Mobility Comments: Used RW for ambulation; no falls withing the last 6 months ADLs Comments: Pt reports that he was Ind with ADLs    OT Problem List: Decreased strength;Decreased knowledge of use of DME or AE;Decreased activity tolerance;Impaired balance (sitting and/or standing);Pain   OT Treatment/Interventions: Self-care/ADL training;Therapeutic exercise;Patient/family education;Therapeutic activities;DME and/or AE instruction      OT Goals(Current goals can be found in the care plan section)   Acute Rehab OT Goals Patient Stated Goal: go home OT Goal Formulation: With patient Time For Goal Achievement: 10/23/24 Potential to Achieve Goals: Good ADL Goals Pt Will Perform Grooming: with contact guard assist;with supervision;standing Pt Will Perform Lower Body Bathing: with min assist;with contact guard assist;sit to/from stand Pt Will Perform Lower Body Dressing: with min assist;with contact guard assist;sit to/from stand Pt Will Transfer to Toilet: with contact guard assist;with supervision;ambulating Pt Will Perform Toileting - Clothing Manipulation and hygiene: with min assist;with contact guard assist;sitting/lateral  leans;sit to/from stand   OT Frequency:  Min 2X/week    Co-evaluation              AM-PAC OT 6 Clicks Daily Activity     Outcome Measure Help from another person eating meals?: None Help from another person taking care of personal grooming?: A Little Help from another person toileting, which includes using toliet, bedpan, or urinal?: A Little Help from another person bathing (including washing, rinsing, drying)?: A Lot Help from another person to put on and taking off regular upper body clothing?: A Little Help from another person to put on and taking off regular lower body clothing?: A Lot 6 Click Score: 17   End of Session Equipment Utilized During Treatment: Gait belt;Rolling walker (2 wheels) Nurse Communication: Mobility status  Activity Tolerance: Patient tolerated treatment well Patient left: in bed  OT Visit Diagnosis: Unsteadiness on feet (R26.81);Other abnormalities of gait and mobility (R26.89);Muscle weakness (generalized) (M62.81)                Time: 8873-8846 OT Time Calculation (min): 27 min Charges:  OT General Charges $OT Visit: 1 Visit OT Evaluation $OT Eval Moderate Complexity: 1 Mod OT Treatments $Self Care/Home Management : 8-22  mins    Jacques Karna Loose 10/09/2024, 1:35 PM

## 2024-10-10 ENCOUNTER — Other Ambulatory Visit (HOSPITAL_COMMUNITY): Payer: Self-pay

## 2024-10-10 ENCOUNTER — Telehealth (HOSPITAL_COMMUNITY): Payer: Self-pay | Admitting: Pharmacy Technician

## 2024-10-10 DIAGNOSIS — J441 Chronic obstructive pulmonary disease with (acute) exacerbation: Secondary | ICD-10-CM | POA: Diagnosis not present

## 2024-10-10 LAB — GLUCOSE, CAPILLARY
Glucose-Capillary: 148 mg/dL — ABNORMAL HIGH (ref 70–99)
Glucose-Capillary: 112 mg/dL — ABNORMAL HIGH (ref 70–99)
Glucose-Capillary: 177 mg/dL — ABNORMAL HIGH (ref 70–99)

## 2024-10-10 LAB — LEGIONELLA PNEUMOPHILA SEROGP 1 UR AG: L. pneumophila Serogp 1 Ur Ag: NEGATIVE

## 2024-10-10 MED ORDER — PREDNISONE 20 MG PO TABS
40.0000 mg | ORAL_TABLET | Freq: Every day | ORAL | 0 refills | Status: AC
  Filled 2024-10-10: qty 4, 2d supply, fill #0

## 2024-10-10 MED ORDER — LIDOCAINE 5 % EX PTCH
1.0000 | MEDICATED_PATCH | Freq: Every day | CUTANEOUS | 0 refills | Status: AC
  Filled 2024-10-10 – 2024-10-11 (×2): qty 30, 30d supply, fill #0

## 2024-10-10 MED ORDER — HYDROCODONE-ACETAMINOPHEN 5-325 MG PO TABS
1.0000 | ORAL_TABLET | ORAL | 0 refills | Status: DC | PRN
  Filled 2024-10-10: qty 30, 5d supply, fill #0

## 2024-10-10 MED ORDER — AZITHROMYCIN 250 MG PO TABS
250.0000 mg | ORAL_TABLET | Freq: Every day | ORAL | 0 refills | Status: AC
  Filled 2024-10-10: qty 3, 3d supply, fill #0

## 2024-10-10 MED ORDER — METHOCARBAMOL 500 MG PO TABS
500.0000 mg | ORAL_TABLET | Freq: Three times a day (TID) | ORAL | 0 refills | Status: AC
  Filled 2024-10-10: qty 42, 14d supply, fill #0

## 2024-10-10 MED ORDER — GUAIFENESIN ER 600 MG PO TB12
600.0000 mg | ORAL_TABLET | Freq: Two times a day (BID) | ORAL | 0 refills | Status: AC
  Filled 2024-10-10: qty 28, 14d supply, fill #0

## 2024-10-10 MED ORDER — GABAPENTIN 100 MG PO CAPS
100.0000 mg | ORAL_CAPSULE | Freq: Two times a day (BID) | ORAL | 0 refills | Status: AC
  Filled 2024-10-10: qty 60, 30d supply, fill #0

## 2024-10-10 NOTE — TOC Transition Note (Signed)
 Transition of Care [facility]) - Discharge Note   Patient Details  Name: Gary Wright MRN: [id number] Date of Birth: 1946/04/03  [facility] CM/SW Contact:  Sonda Manuella Quill, RN Phone Number: 10/10/2024, 11:46 AM   Clinical Narrative:    Orders received for D/C and HHPT/OT; spoke w/ pt over phone; pt declined having HH services set up; pt said he will follow up w/ his PCP to set up these services if decides after D/C; no IP CM needs.   Final next level of care: Home/Self Care Barriers to Discharge: No Barriers Identified   Patient Goals and CMS Choice Patient states their goals for this hospitalization and ongoing recovery are:: return home          Discharge Placement                       Discharge Plan and Services Additional resources added to the After Visit Summary for                  DME Arranged: N/A DME Agency: NA       HH Arranged: NA HH Agency: NA        Social Drivers of Health (SDOH) Interventions SDOH Screenings   Food Insecurity: No Food Insecurity (10/07/2024)  Housing: Low Risk (10/07/2024)  Transportation Needs: No Transportation Needs (10/07/2024)  Utilities: Not At Risk (10/07/2024)  Alcohol Screen: Low Risk (11/18/2023)  Depression (PHQ2-9): Medium Risk (11/18/2023)  Financial Resource Strain: Low Risk (11/18/2023)  Physical Activity: Insufficiently Active (11/18/2023)  Social Connections: Moderately Isolated (10/07/2024)  Stress: No Stress Concern Present (11/18/2023)  Tobacco Use: Medium Risk (10/07/2024)  Health Literacy: Adequate Health Literacy (11/18/2023)     Readmission Risk Interventions    09/28/2024    4:25 PM  Readmission Risk Prevention Plan  Transportation Screening Complete  Medication Review (RN Care Manager) Complete  PCP or Specialist appointment within 3-5 days of discharge Complete  HRI or Home Care Consult Complete  SW Recovery Care/Counseling Consult Complete  Palliative Care  Screening Not Applicable  Skilled Nursing Facility Not Applicable

## 2024-10-10 NOTE — Progress Notes (Signed)
 Subjective:    Patient reports pain as mild.    Objective: Vital signs in last 24 hours: Temp:  [97.8 F (36.6 C)-98.4 F (36.9 C)] 97.8 F (36.6 C) (12/21 0452) Pulse Rate:  [89-102] 91 (12/21 0913) Resp:  [18-20] 18 (12/21 0452) BP: (121-151)/(67-76) 135/73 (12/21 0913) SpO2:  [95 %-99 %] 99 % (12/21 0913)  Intake/Output from previous day: 12/20 0701 - 12/21 0700 In: 220 [P.O.:120; IV Piggyback:100] Out: 550 [Urine:550] Intake/Output this shift: No intake/output data recorded.  Recent Labs    10/07/24 1640 10/08/24 0425  HGB 9.9* 8.2*   Recent Labs    10/07/24 1640 10/08/24 0425  WBC 17.4* 8.8  RBC 3.10* 2.62*  HCT 30.8* 26.0*  PLT 553* 457*   Recent Labs    10/07/24 1640 10/08/24 0425  NA 136 137  K 4.0 4.1  CL 100 105  CO2 22 21*  BUN 17 17  CREATININE 0.96 0.82  GLUCOSE 164* 210*  CALCIUM  9.3 8.5*   Recent Labs    10/07/24 1640  INR 1.3*    Neurologically intact Neurovascular intact Sensation intact distally Intact pulses distally Dorsiflexion/Plantar flexion intact Incision: no drainage No cellulitis present Compartment soft   Assessment/Plan:    Up with therapy WBAT LLE Suture removed yesterday. No need to keep wound covered Continue with PT Pelvis xrays- well seated prosthesis without complication F/u with ortho in 6 weeks      Ronal LITTIE Grave 10/10/2024, 12:30 PM

## 2024-10-10 NOTE — Progress Notes (Signed)
 AVS and discharge instructions reviewed w/ patient. Outpatient pharmacy meds delivered to bedside. Patient verbalized understanding and discharge to home via wife.

## 2024-10-10 NOTE — Discharge Summary (Signed)
 Physician Discharge Summary  Gary Wright FMW:[id number] DOB: [birth year] DOA: 10/07/2024  PCP: Lucius Krabbe, NP  Admit date: 10/07/2024 Discharge date: 10/10/2024  Admitted From: Home Disposition: Home  Recommendations for Outpatient Follow-up:  Follow up with PCP in 1-2 weeks Please obtain BMP/CBC in one week   Discharge Condition: Stable CODE STATUS: Full code Diet recommendation: Low-salt diet  Discharge summary: 78 year old with history of COPD, lung cancer status post resection, chronic systolic heart failure, Takotsubo cardiomyopathy with improved EF, left hip replacement, history of GI bleed, history of PE on Eliquis  presented with shortness of breath for 3 days. 11/7-total left hip and home on 11/11. Recently admitted 11/19 with pulmonary embolism complicated by GI bleed now on Eliquis  Admitted and discharged again on 12/13 for COPD exacerbation In the emergency room patient complaining of left hip pain, patient insist he has pneumonia. On 2 L oxygen, temperature 101.3, WBC count 17.4. CT angiogram of the chest with known chronic pulmonary embolism.  Has some ground glass opacities bilateral lungs.  Emphysematous lungs.  No consolidation. Hip x-ray stable.  Admitted with IV antibiotics and steroids.  Clinically improved.  Left hip pain is major issue.  Started on multimodal pain medication regimen.  Will need follow-up at pain management clinic if continues to require opioids.  Now on room air.   COPD with acute exacerbation, hypoxemia: Improved.  On room air now. Patient at home on trelegy, albuterol .  Continue chest physiotherapy and incentive at home. COVID, flu and RSV negative. MRSA swab, negative Treated with Rocephin  and azithromycin  day 3 today.  Will continue azithromycin  for 3 more days.  Remains afebrile. Improved without need for additional oxygen with mobility.   History of PE: Stable.  Tolerating Eliquis .   Anemia of chronic disease: Recently  transfused.  Baseline hemoglobin about 8-9.  Continue to monitor.  Recheck in 1 week as outpatient.   Bilateral hip pain:  Right hip with advanced osteoarthritic changes.   Left hip avascular necrosis status post total hip replacement 11/7.  Still with significant pain. Updated orthopedics.  Suture removed. Inadequate pain control.  Must have chronic pain issues. Norco 5/325 every 4 hours as needed. Robaxin  500 mg 3 times daily Gabapentin  100 mg twice daily Local lidocaine  patch Ortho will schedule follow-up.     CKD stage IIIb: At about baseline.   Essential hypertension: Blood pressure is stable on Toprol .  Was started on midodrine , however currently not needing it.  Klebsiella UTI: Patient did not have any urinary symptoms.  Urine culture drawn shows more than 100,000 colonies of Klebsiella.  Received 3 days of Rocephin .  Will not continue further UTI treatment.   Chronically ill with multiple issues, left hip pain is a significant issue and he is at high risk of readmission related to COPD and hip pain.   Discharge Diagnoses:  Principal Problem:   COPD exacerbation (HCC) Active Problems:   Acute respiratory failure with hypoxia (HCC)   History of pulmonary embolism   Drug-induced diabetes mellitus   Anemia   Essential hypertension   Chronic kidney disease, stage 3b (HCC)   History of lung cancer   Hyperlipidemia   Physical deconditioning   CAP (community acquired pneumonia)   Avascular necrosis of bone of left hip (HCC)   Pyuria   Sepsis (HCC)   Malnutrition of moderate degree    Discharge Instructions  Discharge Instructions     Diet Carb Modified   Complete by: As directed    Increase activity slowly  Complete by: As directed       Allergies as of 10/10/2024   No Known Allergies      Medication List     STOP taking these medications    Airsupra  90-80 MCG/ACT Aero Generic drug: Albuterol -Budesonide    budesonide  0.5 MG/2ML nebulizer  solution Commonly known as: PULMICORT    buPROPion  300 MG 24 hr tablet Commonly known as: WELLBUTRIN  XL   HumaLOG 100 UNIT/ML injection Generic drug: insulin  lispro   olmesartan  40 MG tablet Commonly known as: BENICAR        TAKE these medications    acetaminophen  325 MG tablet Commonly known as: TYLENOL  Take 1-2 tablets (325-650 mg total) by mouth every 6 (six) hours as needed for mild pain (pain score 1-3) or fever (or temp > 100.5).   albuterol  108 (90 Base) MCG/ACT inhaler Commonly known as: VENTOLIN  HFA Inhale 2 puffs into the lungs every 6 (six) hours as needed for wheezing or shortness of breath.   atorvastatin  40 MG tablet Commonly known as: LIPITOR TAKE 1 TABLET(40 MG) BY MOUTH DAILY AT 6 PM What changed: See the new instructions.   azithromycin  250 MG tablet Commonly known as: ZITHROMAX  Take 1 tablet (250 mg total) by mouth daily for 3 days.   citalopram  20 MG tablet Commonly known as: CELEXA  Take 1 tablet (20 mg total) by mouth daily.   Dupixent  300 MG/2ML Soaj Generic drug: Dupilumab  Inject 300 mg into the skin every 14 (fourteen) days.   Eliquis  5 MG Tabs tablet Generic drug: apixaban  Take 1 tablet (5 mg total) by mouth 2 (two) times daily.   gabapentin  100 MG capsule Commonly known as: NEURONTIN  Take 1 capsule (100 mg total) by mouth 2 (two) times daily.   glipiZIDE  5 MG 24 hr tablet Commonly known as: GLUCOTROL  XL Take 1 tablet (5 mg total) by mouth 2 (two) times daily after a meal.   guaiFENesin  600 MG 12 hr tablet Commonly known as: MUCINEX  Take 1 tablet (600 mg total) by mouth 2 (two) times daily for 14 days.   HYDROcodone -acetaminophen  5-325 MG tablet Commonly known as: NORCO/VICODIN Take 1 tablet by mouth every 4 (four) hours as needed for moderate pain (pain score 4-6). What changed:  how much to take reasons to take this   ipratropium-albuterol  0.5-2.5 (3) MG/3ML Soln Commonly known as: DUONEB Take 3 mLs by nebulization every 6  (six) hours as needed. What changed: reasons to take this   lidocaine  5 % Commonly known as: LIDODERM  Place 1 patch onto the skin daily. Remove & Discard patch within 12 hours or as directed by MD Start taking on: October 11, 2024   methocarbamol  500 MG tablet Commonly known as: ROBAXIN  Take 1 tablet (500 mg total) by mouth every 8 (eight) hours for 14 days.   metoprolol  succinate 25 MG 24 hr tablet Commonly known as: TOPROL -XL Take 0.5 tablets (12.5 mg total) by mouth See admin instructions. Take 1/2 tablet (12.5mg ) by mouth once daily at noon. What changed:  when to take this additional instructions   pantoprazole  40 MG tablet Commonly known as: PROTONIX  Take 1 tablet (40 mg total) by mouth 2 (two) times daily.   predniSONE  20 MG tablet Commonly known as: DELTASONE  Take 2 tablets (40 mg total) by mouth daily with breakfast for 2 days. Start taking on: October 11, 2024   Trelegy Ellipta  200-62.5-25 MCG/ACT Aepb Generic drug: Fluticasone -Umeclidin-Vilant Inhale 1 puff into the lungs every evening.   triamcinolone  ointment 0.5 % Commonly known as: KENALOG   Apply topically 2 (two) times daily. What changed:  how much to take when to take this reasons to take this        Allergies[1]  Consultations: Orthopedics   Procedures/Studies: DG Pelvis Portable Result Date: 10/09/2024 CLINICAL DATA:  Provided history: [id number] Deficient knowledge of open reduction and internal (ORIF) fixation of hip [id number] EXAM: PORTABLE PELVIS 1-2 VIEWS COMPARISON:  Radiograph yesterday FINDINGS: Left hip arthroplasty in expected alignment. No periprosthetic lucency. No acute or periprosthetic fracture. Advanced right hip arthropathy with near complete joint space loss, subchondral cystic change and sclerosis. The bones are subjectively under mineralized. Soft tissue edema lateral to the left hip. IMPRESSION: 1. Left hip arthroplasty in expected alignment. No postoperative complication. 2.  Advanced right hip arthropathy. Electronically Signed   By: Andrea Gasman M.D.   On: 10/09/2024 14:17   DG HIP PORT UNILAT WITH PELVIS 1V LEFT Result Date: 10/08/2024 EXAM: 1 VIEW(S) XRAY OF THE LEFT HIP 10/08/2024 01:34:00 AM COMPARISON: None available. CLINICAL HISTORY: Left hip pain. FINDINGS: BONES AND JOINTS: Left total hip arthroplasty in place. Severe degenerative changes of the right hip. SOFT TISSUES: Vascular calcifications. Residual contrast within the bladder. IMPRESSION: 1. Left total hip arthroplasty in place. 2. Severe degenerative changes of the right hip. Electronically signed by: Oneil Devonshire MD 10/08/2024 02:07 AM EST RP Workstation: [id number]   CT Angio Chest PE W and/or Wo Contrast Result Date: 10/07/2024 EXAM: CTA CHEST 10/07/2024 09:18:02 PM TECHNIQUE: CTA of the chest was performed without and with the administration of 75 mL of iohexol  (OMNIPAQUE ) 350 MG/ML injection. Multiplanar reformatted images are provided for review. MIP images are provided for review. Automated exposure control, iterative reconstruction, and/or weight based adjustment of the mA/kV was utilized to reduce the radiation dose to as low as reasonably achievable. COMPARISON: CT of the chest 100 mg 09/26/2024. CLINICAL HISTORY: Pulmonary embolism (PE) suspected, high prob. FINDINGS: PULMONARY ARTERIES: Pulmonary arteries are adequately opacified for evaluation. There is a single small segmental right lower lobe pulmonary embolism which appears unchanged. All other pulmonary emboli have resolved in the interval. No new pulmonary emboli are seen. Main pulmonary artery is normal in caliber. MEDIASTINUM: The heart and pericardium demonstrate no acute abnormality. There is no acute abnormality of the thoracic aorta. LYMPH NODES: No mediastinal, hilar or axillary lymphadenopathy. LUNGS AND PLEURA: Mild to moderate emphysema present. Mild ground glass opacities within the bilateral lower lobes and right upper lobe. There  is a calcified granuloma in the left upper lobe. No focal consolidation or pulmonary edema. No evidence of pleural effusion or pneumothorax. UPPER ABDOMEN: There is a small hiatal hernia. SOFT TISSUES AND BONES: Anterior chest calcifications. No acute bone or soft tissue abnormality. IMPRESSION: 1. Single small segmental right lower lobe pulmonary embolism, unchanged from prior study. All other pulmonary emboli have resolved in the interval. No new pulmonary emboli. 2. Mild ground glass opacities within the bilateral lower lobes and right upper lobe may represent mild edema or infectious/inflammatory process . 3. Mild to moderate emphysema. Pulmonary emphysema is an independent risk factor for lung cancer. Recommend consideration for evaluation for a low-dose CT lung cancer screening program. 4. Calcified granuloma in the left upper lobe. 5. Small hiatal hernia. Electronically signed by: Greig Pique MD 10/07/2024 09:32 PM EST RP Workstation: [id number]   DG Chest Port 1 View Result Date: 10/07/2024 EXAM: 1 VIEW(S) XRAY OF THE CHEST 10/07/2024 05:02:00 PM COMPARISON: 09/26/2024 CLINICAL HISTORY: Suspected sepsis FINDINGS: LUNGS AND PLEURA: No focal pulmonary opacity. No pleural  effusion. No pneumothorax. HEART AND MEDIASTINUM: Aortic atherosclerosis. No acute abnormality of the cardiac and mediastinal silhouettes. BONES AND SOFT TISSUES: Advanced degenerative changes of left shoulder. No acute osseous abnormality. IMPRESSION: 1. No acute cardiopulmonary abnormality. Electronically signed by: Morgane Naveau MD 10/07/2024 05:37 PM EST RP Workstation: [id number]   CT Angio Chest PE W and/or Wo Contrast Result Date: 09/26/2024 EXAM: CTA of the Chest with contrast for PE 09/26/2024 10:37:58 AM TECHNIQUE: CTA of the chest was performed after the administration of 75 mL of iohexol  (OMNIPAQUE ) 350 MG/ML injection. Multiplanar reformatted images are provided for review. MIP images are provided for review. Automated  exposure control, iterative reconstruction, and/or weight based adjustment of the mA/kV was utilized to reduce the radiation dose to as low as reasonably achievable. COMPARISON: Compared to a prior study. CLINICAL HISTORY: Pulmonary embolism (PE) suspected, high prob. FINDINGS: PULMONARY ARTERIES: Pulmonary arteries are adequately opacified for evaluation. Segmental pulmonary embolus in the lateral and posterior basilar segments of the right lower lobe are similar to the prior study. Nonocclusive saddle embolus at the bifurcation of the right middle lobar pulmonary artery is stable. Segmental embolus in the basilar segmental artery of the left lower lobe is stable. Main pulmonary artery is normal in caliber. MEDIASTINUM: The heart and pericardium demonstrate no acute abnormality. Coronary artery calcifications are present. Atherosclerotic calcifications are present at the aortic arch and great vessel origins. No acute abnormality of the thoracic aorta such as dissection, intramural hematoma, aortic ulceration, or rupture. LYMPH NODES: No mediastinal, hilar or axillary lymphadenopathy. LUNGS AND PLEURA: Centrilobular emphysematous changes are present. No focal consolidation or pulmonary edema. No pleural effusion or pneumothorax. UPPER ABDOMEN: Limited images of the upper abdomen are unremarkable. SOFT TISSUES AND BONES: A remote posterior left eighth nonunion rib fracture is again noted. No acute soft tissue abnormality. IMPRESSION: 1. Stable pulmonary emboli, including segmental emboli in the lateral and posterior basilar segments of the right lower lobe, a nonocclusive saddle embolus at the bifurcation of the right middle lobar pulmonary artery, and a segmental embolus in the basilar segmental artery of the left lower lobe. Electronically signed by: Lonni Necessary MD 09/26/2024 10:57 AM EST RP Workstation: [id number]   DG Chest Portable 1 View Result Date: 09/26/2024 CLINICAL DATA:  Chest pain EXAM:  PORTABLE CHEST 1 VIEW COMPARISON:  09/18/2024 FINDINGS: The lungs are clear without focal pneumonia, edema, pneumothorax or pleural effusion. The cardiopericardial silhouette is within normal limits for size. Telemetry leads overlie the chest. IMPRESSION: No active disease. Electronically Signed   By: Camellia Candle M.D.   On: 09/26/2024 07:31   CT Angio Chest PE W and/or Wo Contrast Result Date: 09/18/2024 EXAM: CTA CHEST 09/18/2024 06:54:00 AM TECHNIQUE: CTA of the chest was performed without and with the administration of 75 mL of intravenous iohexol  (OMNIPAQUE ) 350 MG/ML injection. Multiplanar reformatted images are provided for review. MIP images are provided for review. Automated exposure control, iterative reconstruction, and/or weight based adjustment of the mA/kV was utilized to reduce the radiation dose to as low as reasonably achievable. COMPARISON: Portable chest from today, chest radiograph 08/18/2024, CTA chest 08/02/2024, CTA chest 06/12/2024. CLINICAL HISTORY: Pulmonary embolism (PE) suspected, low to intermediate prob, positive D-dimer. FINDINGS: PULMONARY ARTERIES: The pulmonary arteries are suboptimally opacified beyond the segmental divisions. The subsegmental arteries are poorly evaluated. There is a nonocclusive segmental embolus in the medial segment of the right middle lobe on series 7, axial image 78 and 79. There is at least 1 subsegmental embolus in the right  lower lobe on images 95 through 97. Main pulmonary artery is normal in caliber. MEDIASTINUM: The cardiac size is normal. Small pericardial effusion again collects to the right. There are no findings of acute right heart strain or arterial dilatation. There are patchy 3-vessel coronary artery calcifications. There is atherosclerosis of the aorta and great vessels with mild aortic tortuosity without aneurysm or dissection. The pulmonary veins are nondilated. There is a small hiatal hernia and mild generalized thickening in the  thoracic esophagus consistent with esophagitis. LYMPH NODES: No mediastinal, hilar or axillary lymphadenopathy. LUNGS AND PLEURA: There is left greater than right moderately advanced centrilobular emphysema, due to prior left upper lobectomy. Paraseptal emphysema noted in the right apex as well. There is increased linear atelectasis in the medial right upper lobe. No focal consolidation or pulmonary edema. No evidence of pleural effusion or pneumothorax. No suspicious nodules are seen. UPPER ABDOMEN: There is a 3 mm calyceal stone in the superior pole of the left kidney. No acute upper abdominal findings allowing for respiratory motion artifact. SOFT TISSUES AND BONES: There is osteopenia and degenerative changes of the spine. Advanced bilateral shoulder arthrosis. 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 IMPRESSION: 1. Nonocclusive segmental embolus in the medial segment of the right middle lobe and at least one subsegmental embolus in the right lower lobe, with suboptimal evaluation of subsegmental arteries; no acute right heart strain or arterial dilatation. No other appreciable emboli. 2. Left greater than right moderately advanced centrilobular emphysema with right apical paraseptal emphysema; increased linear atelectasis in the medial right upper lobe; no consolidation, effusion, or suspicious nodules. 3. Aortic and coronary atherosclerosis. Electronically signed by: Francis Quam MD 09/18/2024 07:34 AM EST RP Workstation: [id number]   DG Chest Port 1 View Result Date: 09/18/2024 EXAM: 1 VIEW(S) XRAY OF THE CHEST 09/18/2024 05:22:00 AM COMPARISON: Portable chest 08/18/2024. CLINICAL HISTORY: sob FINDINGS: LUNGS AND PLEURA: Lungs are emphysematous but clear. No focal pulmonary opacity. No pleural effusion. No pneumothorax. HEART AND MEDIASTINUM: The cardiomediastinal silhouette is normal, with aortic atherosclerosis. BONES AND SOFT TISSUES: Mild upper thoracic levoscoliosis and  osteopenia. No new osseous abnormality. Advanced DJD of both shoulders. IMPRESSION: 1. No acute cardiopulmonary process. 2. Emphysema. Stable COPD chest. 3. Aortic atherosclerosis. Electronically signed by: Francis Quam MD 09/18/2024 05:31 AM EST RP Workstation: [id number]   (Echo, Carotid, EGD, Colonoscopy, ERCP)    Subjective: Patient seen and examined.  Breathing much better and feels back to baseline.  On room air.  He was able to use Robaxin , gabapentin  and Norco with relief of hip pain.  He is agreeable to go home with regimen of pain medication and follow-up.  Did not require any oxygen.   Discharge Exam: Vitals:   10/10/24 0452 10/10/24 0913  BP: (!) 140/76 135/73  Pulse: 89 91  Resp: 18   Temp: 97.8 F (36.6 C)   SpO2: 98% 99%   Vitals:   10/09/24 2134 10/10/24 0314 10/10/24 0452 10/10/24 0913  BP:   (!) 140/76 135/73  Pulse:   89 91  Resp:   18   Temp:   97.8 F (36.6 C)   TempSrc:   Oral   SpO2: 95% 96% 98% 99%  Weight:      Height:        General: Pt is alert, awake, not in acute distress Cardiovascular: RRR, S1/S2 +, no rubs, no gallops Respiratory: CTA bilaterally, no wheezing, no rhonchi, on room air.  No added sounds. Abdominal: Soft, NT, ND,  bowel sounds + Extremities: no edema, no cyanosis Left anterior hip incisions clean and dry.  Sutures removed.    The results of significant diagnostics from this hospitalization (including imaging, microbiology, ancillary and laboratory) are listed below for reference.     Microbiology: Recent Results (from the past 240 hours)  Culture, blood (Routine x 2)     Status: None (Preliminary result)   Collection Time: 10/07/24  4:45 PM   Specimen: BLOOD  Result Value Ref Range Status   Specimen Description   Final    BLOOD LEFT ANTECUBITAL Performed at [facility], [address], [place], [place] [address]    Special Requests   Final    BOTTLES DRAWN AEROBIC AND ANAEROBIC Blood Culture  adequate volume Performed at [facility], [address], [place], [place] [address]    Culture   Final    NO GROWTH 3 DAYS Performed at [facility], [address], [place], [place] [address]    Report Status PENDING  Incomplete  Culture, blood (Routine x 2)     Status: None (Preliminary result)   Collection Time: 10/07/24  4:45 PM   Specimen: BLOOD LEFT ARM  Result Value Ref Range Status   Specimen Description   Final    BLOOD LEFT ARM Performed at [facility], [address], [place], [place] [address]    Special Requests   Final    BOTTLES DRAWN AEROBIC ONLY Blood Culture adequate volume Performed at [facility], [address], [place], [place] [address]    Culture   Final    NO GROWTH 3 DAYS Performed at [facility], [address] [place], [place] [address]    Report Status PENDING  Incomplete  Urine Culture     Status: Abnormal   Collection Time: 10/07/24  5:57 PM   Specimen: Urine, Random  Result Value Ref Range Status   Specimen Description   Final    URINE, RANDOM Performed at [facility], [address], [place], [place] [address]    Special Requests   Final    NONE Reflexed from [id number] Performed at [facility], [address] [place], [place] [address]    Culture >=100,000 COLONIES/mL KLEBSIELLA PNEUMONIAE (A)  Final   Report Status 10/09/2024 FINAL  Final   Organism ID, Bacteria KLEBSIELLA PNEUMONIAE (A)  Final      Susceptibility   Klebsiella pneumoniae - MIC*    AMPICILLIN >=32 RESISTANT Resistant     CEFAZOLIN  (URINE) Value in next row Sensitive      2 SENSITIVEThis is a modified FDA-approved test that has been validated and its performance characteristics determined by the reporting laboratory.  This laboratory is certified under the Clinical Laboratory Improvement Amendments CLIA as qualified to perform high complexity clinical laboratory testing.     CEFEPIME  Value in next row Sensitive      2 SENSITIVEThis is a modified FDA-approved test that has been validated and its performance characteristics determined by the reporting laboratory.  This laboratory is certified under the Clinical Laboratory Improvement Amendments CLIA as qualified to perform high complexity clinical laboratory testing.    ERTAPENEM Value in next row Sensitive      2 SENSITIVEThis is a modified FDA-approved test that has been validated and its performance characteristics determined by the reporting laboratory.  This laboratory is certified under the Clinical Laboratory Improvement Amendments CLIA as qualified to perform high complexity  clinical laboratory testing.    CEFTRIAXONE  Value in next row Sensitive      2 SENSITIVEThis is a modified FDA-approved test that has been validated and its performance characteristics determined by the reporting laboratory.  This laboratory is certified under the Clinical Laboratory Improvement Amendments CLIA as qualified to perform high complexity clinical laboratory testing.    CIPROFLOXACIN Value in next row Sensitive      2 SENSITIVEThis is a modified FDA-approved test that has been validated and its performance characteristics determined by the reporting laboratory.  This laboratory is certified under the Clinical Laboratory Improvement Amendments CLIA as qualified to perform high complexity clinical laboratory testing.    GENTAMICIN Value in next row Sensitive      2 SENSITIVEThis is a modified FDA-approved test that has been validated and its performance characteristics determined by the reporting laboratory.  This laboratory is certified under the Clinical Laboratory Improvement Amendments CLIA as qualified to perform high complexity clinical laboratory testing.    NITROFURANTOIN Value in next row Sensitive      2 SENSITIVEThis is a modified FDA-approved test that has been validated and its performance characteristics determined by the  reporting laboratory.  This laboratory is certified under the Clinical Laboratory Improvement Amendments CLIA as qualified to perform high complexity clinical laboratory testing.    TRIMETH/SULFA Value in next row Sensitive      2 SENSITIVEThis is a modified FDA-approved test that has been validated and its performance characteristics determined by the reporting laboratory.  This laboratory is certified under the Clinical Laboratory Improvement Amendments CLIA as qualified to perform high complexity clinical laboratory testing.    AMPICILLIN/SULBACTAM Value in next row Intermediate      2 SENSITIVEThis is a modified FDA-approved test that has been validated and its performance characteristics determined by the reporting laboratory.  This laboratory is certified under the Clinical Laboratory Improvement Amendments CLIA as qualified to perform high complexity clinical laboratory testing.    PIP/TAZO Value in next row Sensitive      16 SENSITIVEThis is a modified FDA-approved test that has been validated and its performance characteristics determined by the reporting laboratory.  This laboratory is certified under the Clinical Laboratory Improvement Amendments CLIA as qualified to perform high complexity clinical laboratory testing.    MEROPENEM Value in next row Sensitive      16 SENSITIVEThis is a modified FDA-approved test that has been validated and its performance characteristics determined by the reporting laboratory.  This laboratory is certified under the Clinical Laboratory Improvement Amendments CLIA as qualified to perform high complexity clinical laboratory testing.    * >=100,000 COLONIES/mL KLEBSIELLA PNEUMONIAE  Resp panel by RT-PCR (RSV, Flu A&B, Covid) Anterior Nasal Swab     Status: None   Collection Time: 10/07/24  7:15 PM   Specimen: Anterior Nasal Swab  Result Value Ref Range Status   SARS Coronavirus 2 by RT PCR NEGATIVE NEGATIVE Final    Comment: (NOTE) SARS-CoV-2 target nucleic  acids are NOT DETECTED.  The SARS-CoV-2 RNA is generally detectable in upper respiratory specimens during the acute phase of infection. The lowest concentration of SARS-CoV-2 viral copies this assay can detect is 138 copies/mL. A negative result does not preclude SARS-Cov-2 infection and should not be used as the sole basis for treatment or other patient management decisions. A negative result may occur with  improper specimen collection/handling, submission of specimen other than nasopharyngeal swab, presence of viral mutation(s) within the areas targeted by this assay, and inadequate number  of viral copies(<138 copies/mL). A negative result must be combined with clinical observations, patient history, and epidemiological information. The expected result is Negative.  Fact Sheet for Patients:  bloggercourse.com  Fact Sheet for Healthcare Providers:  seriousbroker.it  This test is no t yet approved or cleared by the [place]  FDA and  has been authorized for detection and/or diagnosis of SARS-CoV-2 by FDA under an Emergency Use Authorization (EUA). This EUA will remain  in effect (meaning this test can be used) for the duration of the COVID-19 declaration under Section 564(b)(1) of the Act, 21 U.S.C.section 360bbb-3(b)(1), unless the authorization is terminated  or revoked sooner.       Influenza A by PCR NEGATIVE NEGATIVE Final   Influenza B by PCR NEGATIVE NEGATIVE Final    Comment: (NOTE) The Xpert Xpress SARS-CoV-2/FLU/RSV plus assay is intended as an aid in the diagnosis of influenza from Nasopharyngeal swab specimens and should not be used as a sole basis for treatment. Nasal washings and aspirates are unacceptable for Xpert Xpress SARS-CoV-2/FLU/RSV testing.  Fact Sheet for Patients: bloggercourse.com  Fact Sheet for Healthcare Providers: seriousbroker.it  This  test is not yet approved or cleared by the [place]  FDA and has been authorized for detection and/or diagnosis of SARS-CoV-2 by FDA under an Emergency Use Authorization (EUA). This EUA will remain in effect (meaning this test can be used) for the duration of the COVID-19 declaration under Section 564(b)(1) of the Act, 21 U.S.C. section 360bbb-3(b)(1), unless the authorization is terminated or revoked.     Resp Syncytial Virus by PCR NEGATIVE NEGATIVE Final    Comment: (NOTE) Fact Sheet for Patients: bloggercourse.com  Fact Sheet for Healthcare Providers: seriousbroker.it  This test is not yet approved or cleared by the [place]  FDA and has been authorized for detection and/or diagnosis of SARS-CoV-2 by FDA under an Emergency Use Authorization (EUA). This EUA will remain in effect (meaning this test can be used) for the duration of the COVID-19 declaration under Section 564(b)(1) of the Act, 21 U.S.C. section 360bbb-3(b)(1), unless the authorization is terminated or revoked.  Performed at [facility], [address], [place], [place] [address]   MRSA Next Gen by PCR, Nasal     Status: None   Collection Time: 10/08/24  3:00 AM   Specimen: Nasal Mucosa; Nasal Swab  Result Value Ref Range Status   MRSA by PCR Next Gen NOT DETECTED NOT DETECTED Final    Comment: (NOTE) The GeneXpert MRSA Assay (FDA approved for NASAL specimens only), is one component of a comprehensive MRSA colonization surveillance program. It is not intended to diagnose MRSA infection nor to guide or monitor treatment for MRSA infections. Test performance is not FDA approved in patients less than 34 years old. Performed at [facility], [address], [place], [place] [address]      Labs: BNP (last 3 results) Recent Labs    08/18/24 1500 09/18/24 0513 09/26/24 0624  BNP 44.3 51.9 57.7   Basic Metabolic  Panel: Recent Labs  Lab 10/07/24 1640 10/08/24 0425  NA 136 137  K 4.0 4.1  CL 100 105  CO2 22 21*  GLUCOSE 164* 210*  BUN 17 17  CREATININE 0.96 0.82  CALCIUM  9.3 8.5*  MG  --  2.1  PHOS  --  2.8   Liver Function Tests: Recent Labs  Lab 10/07/24 1640 10/08/24 0425  AST 23 17  ALT 26 20  ALKPHOS 69 56  BILITOT 1.4* 0.5  PROT 6.9 5.5*  ALBUMIN  3.8 3.3*   No results for input(s): LIPASE, AMYLASE in the last 168 hours. No results for input(s): AMMONIA in the last 168 hours. CBC: Recent Labs  Lab 10/07/24 1640 10/08/24 0425  WBC 17.4* 8.8  NEUTROABS 14.7*  --   HGB 9.9* 8.2*  HCT 30.8* 26.0*  MCV 99.4 99.2  PLT 553* 457*   Cardiac Enzymes: No results for input(s): CKTOTAL, CKMB, CKMBINDEX, TROPONINI in the last 168 hours. BNP: Invalid input(s): POCBNP CBG: Recent Labs  Lab 10/09/24 1658 10/09/24 2020 10/09/24 2354 10/10/24 0449 10/10/24 0737  GLUCAP 256* 197* 159* 148* 112*   D-Dimer No results for input(s): DDIMER in the last 72 hours. Hgb A1c Recent Labs    10/07/24 1640  HGBA1C 5.6   Lipid Profile No results for input(s): CHOL, HDL, LDLCALC, TRIG, CHOLHDL, LDLDIRECT in the last 72 hours. Thyroid  function studies No results for input(s): TSH, T4TOTAL, T3FREE, THYROIDAB in the last 72 hours.  Invalid input(s): FREET3 Anemia work up No results for input(s): VITAMINB12, FOLATE, FERRITIN, TIBC, IRON, RETICCTPCT in the last 72 hours. Urinalysis    Component Value Date/Time   COLORURINE YELLOW 10/07/2024 1757   APPEARANCEUR CLOUDY (A) 10/07/2024 1757   LABSPEC 1.018 10/07/2024 1757   PHURINE 6.0 10/07/2024 1757   GLUCOSEU NEGATIVE 10/07/2024 1757   HGBUR SMALL (A) 10/07/2024 1757   BILIRUBINUR NEGATIVE 10/07/2024 1757   KETONESUR 20 (A) 10/07/2024 1757   PROTEINUR 30 (A) 10/07/2024 1757   NITRITE NEGATIVE 10/07/2024 1757   LEUKOCYTESUR SMALL (A) 10/07/2024 1757   Sepsis Labs Recent Labs   Lab 10/07/24 1640 10/08/24 0425  WBC 17.4* 8.8   Microbiology Recent Results (from the past 240 hours)  Culture, blood (Routine x 2)     Status: None (Preliminary result)   Collection Time: 10/07/24  4:45 PM   Specimen: BLOOD  Result Value Ref Range Status   Specimen Description   Final    BLOOD LEFT ANTECUBITAL Performed at [facility], [address], [place], [place] [address]    Special Requests   Final    BOTTLES DRAWN AEROBIC AND ANAEROBIC Blood Culture adequate volume Performed at [facility], [address], [place], [place] [address]    Culture   Final    NO GROWTH 3 DAYS Performed at [facility], [address], [place], [place] [address]    Report Status PENDING  Incomplete  Culture, blood (Routine x 2)     Status: None (Preliminary result)   Collection Time: 10/07/24  4:45 PM   Specimen: BLOOD LEFT ARM  Result Value Ref Range Status   Specimen Description   Final    BLOOD LEFT ARM Performed at [facility], [address] [place], [place] [address]    Special Requests   Final    BOTTLES DRAWN AEROBIC ONLY Blood Culture adequate volume Performed at [facility], [address], [place], [place] [address]    Culture   Final    NO GROWTH 3 DAYS Performed at [facility], [address], [place], [place] [address]    Report Status PENDING  Incomplete  Urine Culture     Status: Abnormal   Collection Time: 10/07/24  5:57 PM   Specimen: Urine, Random  Result Value Ref Range Status   Specimen Description   Final    URINE, RANDOM Performed at [facility], [address] [place], [place]  [address]    Special Requests   Final    NONE Reflexed from [id number] Performed at [facility], [address] [place], [place] [address]    Culture >=100,000 COLONIES/mL KLEBSIELLA PNEUMONIAE (A)  Final   Report Status 10/09/2024 FINAL  Final   Organism ID,  Bacteria KLEBSIELLA PNEUMONIAE (A)  Final      Susceptibility   Klebsiella pneumoniae - MIC*    AMPICILLIN >=32 RESISTANT Resistant     CEFAZOLIN  (URINE) Value in next row Sensitive      2 SENSITIVEThis is a modified FDA-approved test that has been validated and its performance characteristics determined by the reporting laboratory.  This laboratory is certified under the Clinical Laboratory Improvement Amendments CLIA as qualified to perform high complexity clinical laboratory testing.    CEFEPIME  Value in next row Sensitive      2 SENSITIVEThis is a modified FDA-approved test that has been validated and its performance characteristics determined by the reporting laboratory.  This laboratory is certified under the Clinical Laboratory Improvement Amendments CLIA as qualified to perform high complexity clinical laboratory testing.    ERTAPENEM Value in next row Sensitive      2 SENSITIVEThis is a modified FDA-approved test that has been validated and its performance characteristics determined by the reporting laboratory.  This laboratory is certified under the Clinical Laboratory Improvement Amendments CLIA as qualified to perform high complexity clinical laboratory testing.    CEFTRIAXONE  Value in next row Sensitive      2 SENSITIVEThis is a modified FDA-approved test that has been validated and its performance characteristics determined by the reporting laboratory.  This laboratory is certified under the Clinical Laboratory Improvement Amendments CLIA as qualified to perform high complexity clinical laboratory testing.    CIPROFLOXACIN Value in next row Sensitive      2 SENSITIVEThis is a modified FDA-approved test that has been validated and its performance characteristics determined by the reporting laboratory.  This laboratory is certified under the Clinical Laboratory Improvement Amendments CLIA as qualified to perform high complexity clinical laboratory testing.    GENTAMICIN Value in next row  Sensitive      2 SENSITIVEThis is a modified FDA-approved test that has been validated and its performance characteristics determined by the reporting laboratory.  This laboratory is certified under the Clinical Laboratory Improvement Amendments CLIA as qualified to perform high complexity clinical laboratory testing.    NITROFURANTOIN Value in next row Sensitive      2 SENSITIVEThis is a modified FDA-approved test that has been validated and its performance characteristics determined by the reporting laboratory.  This laboratory is certified under the Clinical Laboratory Improvement Amendments CLIA as qualified to perform high complexity clinical laboratory testing.    TRIMETH/SULFA Value in next row Sensitive      2 SENSITIVEThis is a modified FDA-approved test that has been validated and its performance characteristics determined by the reporting laboratory.  This laboratory is certified under the Clinical Laboratory Improvement Amendments CLIA as qualified to perform high complexity clinical laboratory testing.    AMPICILLIN/SULBACTAM Value in next row Intermediate      2 SENSITIVEThis is a modified FDA-approved test that has been validated and its performance characteristics determined by the reporting laboratory.  This laboratory is certified under the Clinical Laboratory Improvement Amendments CLIA as qualified to perform high complexity clinical laboratory testing.    PIP/TAZO Value in next row Sensitive      16 SENSITIVEThis is a modified FDA-approved test that has been  validated and its performance characteristics determined by the reporting laboratory.  This laboratory is certified under the Clinical Laboratory Improvement Amendments CLIA as qualified to perform high complexity clinical laboratory testing.    MEROPENEM Value in next row Sensitive      16 SENSITIVEThis is a modified FDA-approved test that has been validated and its performance characteristics determined by the reporting  laboratory.  This laboratory is certified under the Clinical Laboratory Improvement Amendments CLIA as qualified to perform high complexity clinical laboratory testing.    * >=100,000 COLONIES/mL KLEBSIELLA PNEUMONIAE  Resp panel by RT-PCR (RSV, Flu A&B, Covid) Anterior Nasal Swab     Status: None   Collection Time: 10/07/24  7:15 PM   Specimen: Anterior Nasal Swab  Result Value Ref Range Status   SARS Coronavirus 2 by RT PCR NEGATIVE NEGATIVE Final    Comment: (NOTE) SARS-CoV-2 target nucleic acids are NOT DETECTED.  The SARS-CoV-2 RNA is generally detectable in upper respiratory specimens during the acute phase of infection. The lowest concentration of SARS-CoV-2 viral copies this assay can detect is 138 copies/mL. A negative result does not preclude SARS-Cov-2 infection and should not be used as the sole basis for treatment or other patient management decisions. A negative result may occur with  improper specimen collection/handling, submission of specimen other than nasopharyngeal swab, presence of viral mutation(s) within the areas targeted by this assay, and inadequate number of viral copies(<138 copies/mL). A negative result must be combined with clinical observations, patient history, and epidemiological information. The expected result is Negative.  Fact Sheet for Patients:  bloggercourse.com  Fact Sheet for Healthcare Providers:  seriousbroker.it  This test is no t yet approved or cleared by the [place]  FDA and  has been authorized for detection and/or diagnosis of SARS-CoV-2 by FDA under an Emergency Use Authorization (EUA). This EUA will remain  in effect (meaning this test can be used) for the duration of the COVID-19 declaration under Section 564(b)(1) of the Act, 21 U.S.C.section 360bbb-3(b)(1), unless the authorization is terminated  or revoked sooner.       Influenza A by PCR NEGATIVE NEGATIVE Final    Influenza B by PCR NEGATIVE NEGATIVE Final    Comment: (NOTE) The Xpert Xpress SARS-CoV-2/FLU/RSV plus assay is intended as an aid in the diagnosis of influenza from Nasopharyngeal swab specimens and should not be used as a sole basis for treatment. Nasal washings and aspirates are unacceptable for Xpert Xpress SARS-CoV-2/FLU/RSV testing.  Fact Sheet for Patients: bloggercourse.com  Fact Sheet for Healthcare Providers: seriousbroker.it  This test is not yet approved or cleared by the [place]  FDA and has been authorized for detection and/or diagnosis of SARS-CoV-2 by FDA under an Emergency Use Authorization (EUA). This EUA will remain in effect (meaning this test can be used) for the duration of the COVID-19 declaration under Section 564(b)(1) of the Act, 21 U.S.C. section 360bbb-3(b)(1), unless the authorization is terminated or revoked.     Resp Syncytial Virus by PCR NEGATIVE NEGATIVE Final    Comment: (NOTE) Fact Sheet for Patients: bloggercourse.com  Fact Sheet for Healthcare Providers: seriousbroker.it  This test is not yet approved or cleared by the [place]  FDA and has been authorized for detection and/or diagnosis of SARS-CoV-2 by FDA under an Emergency Use Authorization (EUA). This EUA will remain in effect (meaning this test can be used) for the duration of the COVID-19 declaration under Section 564(b)(1) of the Act, 21 U.S.C. section 360bbb-3(b)(1), unless the authorization is terminated or revoked.  Performed at [facility], [address], [place], [place] [address]   MRSA Next Gen by PCR, Nasal     Status: None   Collection Time: 10/08/24  3:00 AM   Specimen: Nasal Mucosa; Nasal Swab  Result Value Ref Range Status   MRSA by PCR Next Gen NOT DETECTED NOT DETECTED Final    Comment: (NOTE) The GeneXpert MRSA Assay (FDA approved for  NASAL specimens only), is one component of a comprehensive MRSA colonization surveillance program. It is not intended to diagnose MRSA infection nor to guide or monitor treatment for MRSA infections. Test performance is not FDA approved in patients less than 68 years old. Performed at [facility], [address] [place], [place] [address]      Time coordinating discharge: 35 minutes  SIGNED:   Renato Applebaum, MD  [facility] 10/10/2024, 9:37 AM     [1] No Known Allergies

## 2024-10-11 ENCOUNTER — Other Ambulatory Visit (HOSPITAL_COMMUNITY): Payer: Self-pay

## 2024-10-11 ENCOUNTER — Telehealth (HOSPITAL_COMMUNITY): Payer: Self-pay

## 2024-10-11 ENCOUNTER — Telehealth: Payer: Self-pay | Admitting: *Deleted

## 2024-10-11 NOTE — Transitions of Care (Post Inpatient/ED Visit) (Signed)
" ° °  10/11/2024  Name: Gary Wright MRN: [id number] DOB: 1946/02/23  Today's TOC FU Call Status: Today's TOC FU Call Status:: Unsuccessful Call (1st Attempt) Unsuccessful Call (1st Attempt) Date: 10/11/24  Attempted to reach the patient regarding the most recent Inpatient/ED visit.  Follow Up Plan: Additional outreach attempts will be made to reach the patient to complete the Transitions of Care (Post Inpatient/ED visit) call.   Andrea Dimes RN, BSN [place]  [facility] RN Care Manager [phone]  "

## 2024-10-11 NOTE — Telephone Encounter (Signed)
 Pharmacy Patient Advocate Encounter  Received notification from [facility] that Prior Authorization for Lidocaine  5% patches has been APPROVED from 10/11/24 to 10/20/25. Ran test claim, Copay is $0. This test claim was processed through [facility]- copay amounts may vary at other pharmacies due to pharmacy/plan contracts, or as the patient moves through the different stages of their insurance plan.   PA #/Case ID/Reference #:[id number]

## 2024-10-12 ENCOUNTER — Other Ambulatory Visit (HOSPITAL_COMMUNITY): Payer: Self-pay

## 2024-10-12 ENCOUNTER — Ambulatory Visit (HOSPITAL_COMMUNITY)

## 2024-10-12 ENCOUNTER — Encounter: Admitting: Orthopaedic Surgery

## 2024-10-12 ENCOUNTER — Telehealth: Payer: Self-pay | Admitting: *Deleted

## 2024-10-12 LAB — CULTURE, BLOOD (ROUTINE X 2)
Culture: NO GROWTH
Culture: NO GROWTH
Special Requests: ADEQUATE
Special Requests: ADEQUATE

## 2024-10-12 NOTE — Transitions of Care (Post Inpatient/ED Visit) (Signed)
" ° °  10/12/2024  Name: Gary Wright MRN: [id number] DOB: February 13, 1946  Today's TOC FU Call Status: Today's TOC FU Call Status:: Unsuccessful Call (2nd Attempt) Unsuccessful Call (2nd Attempt) Date: 10/12/24  Attempted to reach the patient regarding the most recent Inpatient/ED visit.  Follow Up Plan: Additional outreach attempts will be made to reach the patient to complete the Transitions of Care (Post Inpatient/ED visit) call.   Mliss Creed [facility], BSN RN Care Manager/ Transition of Care [place]/ [facility] [phone]  "

## 2024-10-15 ENCOUNTER — Other Ambulatory Visit (HOSPITAL_COMMUNITY): Payer: Self-pay

## 2024-10-16 ENCOUNTER — Emergency Department (HOSPITAL_COMMUNITY)
Admission: EM | Admit: 2024-10-16 | Discharge: 2024-10-16 | Disposition: A | Discharge status: Home / Self Care | Attending: Emergency Medicine | Admitting: Emergency Medicine

## 2024-10-16 ENCOUNTER — Other Ambulatory Visit: Payer: Self-pay

## 2024-10-16 ENCOUNTER — Emergency Department (HOSPITAL_COMMUNITY)

## 2024-10-16 DIAGNOSIS — Z96642 Presence of left artificial hip joint: Secondary | ICD-10-CM | POA: Diagnosis not present

## 2024-10-16 DIAGNOSIS — M25552 Pain in left hip: Secondary | ICD-10-CM | POA: Insufficient documentation

## 2024-10-16 DIAGNOSIS — I2510 Atherosclerotic heart disease of native coronary artery without angina pectoris: Secondary | ICD-10-CM | POA: Diagnosis not present

## 2024-10-16 DIAGNOSIS — Z87891 Personal history of nicotine dependence: Secondary | ICD-10-CM | POA: Insufficient documentation

## 2024-10-16 DIAGNOSIS — I10 Essential (primary) hypertension: Secondary | ICD-10-CM | POA: Diagnosis not present

## 2024-10-16 DIAGNOSIS — Z7901 Long term (current) use of anticoagulants: Secondary | ICD-10-CM | POA: Insufficient documentation

## 2024-10-16 DIAGNOSIS — J441 Chronic obstructive pulmonary disease with (acute) exacerbation: Secondary | ICD-10-CM | POA: Insufficient documentation

## 2024-10-16 DIAGNOSIS — Z85118 Personal history of other malignant neoplasm of bronchus and lung: Secondary | ICD-10-CM | POA: Insufficient documentation

## 2024-10-16 DIAGNOSIS — R0602 Shortness of breath: Secondary | ICD-10-CM | POA: Diagnosis present

## 2024-10-16 LAB — RESP PANEL BY RT-PCR (RSV, FLU A&B, COVID)  RVPGX2
Influenza A by PCR: NEGATIVE
Influenza B by PCR: NEGATIVE
Resp Syncytial Virus by PCR: NEGATIVE
SARS Coronavirus 2 by RT PCR: NEGATIVE

## 2024-10-16 LAB — I-STAT CG4 LACTIC ACID, ED: Lactic Acid, Venous: 1.7 mmol/L (ref 0.5–1.9)

## 2024-10-16 LAB — CBC WITH DIFFERENTIAL/PLATELET
Abs Immature Granulocytes: 0.14 K/uL — ABNORMAL HIGH (ref 0.00–0.07)
Basophils Absolute: 0 K/uL (ref 0.0–0.1)
Basophils Relative: 0 %
Eosinophils Absolute: 0.2 K/uL (ref 0.0–0.5)
Eosinophils Relative: 2 %
HCT: 30.9 % — ABNORMAL LOW (ref 39.0–52.0)
Hemoglobin: 9.8 g/dL — ABNORMAL LOW (ref 13.0–17.0)
Immature Granulocytes: 1 %
Lymphocytes Relative: 18 %
Lymphs Abs: 2 K/uL (ref 0.7–4.0)
MCH: 31.6 pg (ref 26.0–34.0)
MCHC: 31.7 g/dL (ref 30.0–36.0)
MCV: 99.7 fL (ref 80.0–100.0)
Monocytes Absolute: 0.7 K/uL (ref 0.1–1.0)
Monocytes Relative: 6 %
Neutro Abs: 8 K/uL — ABNORMAL HIGH (ref 1.7–7.7)
Neutrophils Relative %: 73 %
Platelets: 373 K/uL (ref 150–400)
RBC: 3.1 MIL/uL — ABNORMAL LOW (ref 4.22–5.81)
RDW: 16.1 % — ABNORMAL HIGH (ref 11.5–15.5)
WBC: 11 K/uL — ABNORMAL HIGH (ref 4.0–10.5)
nRBC: 0 % (ref 0.0–0.2)

## 2024-10-16 LAB — BASIC METABOLIC PANEL WITH GFR
Anion gap: 11 (ref 5–15)
BUN: 11 mg/dL (ref 8–23)
CO2: 26 mmol/L (ref 22–32)
Calcium: 8.9 mg/dL (ref 8.9–10.3)
Chloride: 102 mmol/L (ref 98–111)
Creatinine, Ser: 0.9 mg/dL (ref 0.61–1.24)
GFR, Estimated: >60 mL/min
Glucose, Bld: 153 mg/dL — ABNORMAL HIGH (ref 70–99)
Potassium: 3.7 mmol/L (ref 3.5–5.1)
Sodium: 139 mmol/L (ref 135–145)

## 2024-10-16 MED ORDER — PREDNISONE 20 MG PO TABS: 40.0000 mg | ORAL_TABLET | Freq: Every day | ORAL | 0 refills | Status: DC

## 2024-10-16 MED ORDER — OXYCODONE-ACETAMINOPHEN 5-325 MG PO TABS
1.0000 | ORAL_TABLET | Freq: Once | ORAL | Status: AC
  Administered 2024-10-16: 1 via ORAL
  Filled 2024-10-16: qty 1

## 2024-10-16 MED ORDER — IPRATROPIUM-ALBUTEROL 0.5-2.5 (3) MG/3ML IN SOLN
3.0000 mL | Freq: Once | RESPIRATORY_TRACT | Status: AC
  Administered 2024-10-16: 3 mL via RESPIRATORY_TRACT
  Filled 2024-10-16: qty 3

## 2024-10-16 MED ORDER — DOXYCYCLINE HYCLATE 100 MG PO CAPS: 100.0000 mg | ORAL_CAPSULE | Freq: Two times a day (BID) | ORAL | 0 refills | Status: DC

## 2024-10-16 NOTE — ED Triage Notes (Signed)
 Pt BIB [facility] from home. Pt reports having SOB. Pt dx pneumonia last week requiring admission and antibiotic therapy. Ems administered 1 duoneb and 125 solumedrol. Pt sats 97% on room air.   152/72 112HR 97% RA

## 2024-10-16 NOTE — ED Provider Notes (Signed)
 " [place] [facility]ERGENCY DE[facility]MENT AT [facility] Provider Note   CSN: [id number] Arrival date & time: 10/16/24  9578     Patient presents with: No chief complaint on file.   Gary Wright is a 78 y.o. male.   HPI     This is a 78 year old male with history of COPD who presents with shortness of breath.  Was recently admitted for COPD exacerbation and pneumonia.  Also recently had a left hip replacement on November 7.  Reports shortness of breath.  He was given DuoNeb by EMS.  Patient additionally is complaining of left hip pain.  He states that he feels that his hip incision is red and swollen.  He is unsure whether it was infected.  Has not had any fevers.  Prior to Admission medications  Medication Sig Start Date End Date Taking? Authorizing Provider  doxycycline  (VIBRAMYCIN ) 100 MG capsule Take 1 capsule (100 mg total) by mouth 2 (two) times daily. 10/16/24  Yes Infant Doane, Charmaine FALCON, MD  predniSONE  (DELTASONE ) 20 MG tablet Take 2 tablets (40 mg total) by mouth daily. 10/16/24  Yes Shalona Harbour, Charmaine FALCON, MD  acetaminophen  (TYLENOL ) 325 MG tablet Take 1-2 tablets (325-650 mg total) by mouth every 6 (six) hours as needed for mild pain (pain score 1-3) or fever (or temp > 100.5). Patient not taking: Reported on 10/09/2024 08/31/24   Sherrill Cable Latif, DO  albuterol  (VENTOLIN  HFA) 108 [phone] Base) MCG/ACT inhaler Inhale 2 puffs into the lungs every 6 (six) hours as needed for wheezing or shortness of breath.    [provider]  apixaban  (ELIQUIS ) 5 MG TABS tablet Take 1 tablet (5 mg total) by mouth 2 (two) times daily. 10/02/24   Samtani, Jai-Gurmukh, MD  atorvastatin  (LIPITOR) 40 MG tablet TAKE 1 TABLET(40 MG) BY MOUTH DAILY AT 6 PM Patient taking differently: Take 40 mg by mouth every evening. 08/18/23   Pietro Redell RAMAN, MD  citalopram  (CELEXA ) 20 MG tablet Take 1 tablet (20 mg total) by mouth daily. 10/02/24   Samtani, Jai-Gurmukh, MD  Dupilumab  (DUPIXENT ) 300  MG/2ML SOAJ Inject 300 mg into the skin every 14 (fourteen) days. 07/21/24   [name] Dorn NOVAK, MD  Fluticasone -Umeclidin-Vilant (TRELEGY ELLIPTA ) 200-62.5-25 MCG/ACT AEPB Inhale 1 puff into the lungs every evening. 09/09/24   Jesus Bernardino MATSU, MD  gabapentin  (NEURONTIN ) 100 MG capsule Take 1 capsule (100 mg total) by mouth 2 (two) times daily. 10/10/24 11/09/24  Raenelle Coria, MD  glipiZIDE  (GLUCOTROL  XL) 5 MG 24 hr tablet Take 1 tablet (5 mg total) by mouth 2 (two) times daily after a meal. 09/20/24   Lucius Krabbe, NP  guaiFENesin  (MUCINEX ) 600 MG 12 hr tablet Take 1 tablet (600 mg total) by mouth 2 (two) times daily for 14 days. 10/10/24 10/24/24  Raenelle Coria, MD  HYDROcodone -acetaminophen  (NORCO/VICODIN) 5-325 MG tablet Take 1 tablet by mouth every 4 (four) hours as needed for moderate pain (pain score 4-6). 10/10/24   Ghimire, Kuber, MD  ipratropium-albuterol  (DUONEB) 0.5-2.5 (3) MG/3ML SOLN Take 3 mLs by nebulization every 6 (six) hours as needed. Patient taking differently: Take 3 mLs by nebulization every 6 (six) hours as needed (for shortness of breath or wheezing). 09/09/24   Jesus Bernardino MATSU, MD  lidocaine  (LIDODERM ) 5 % Place 1 patch onto the skin daily. Remove & Discard patch within 12 hours or as directed by MD 10/11/24   Raenelle Coria, MD  methocarbamol  (ROBAXIN ) 500 MG tablet Take 1 tablet (500 mg total) by  mouth every 8 (eight) hours for 14 days. 10/10/24 10/24/24  Ghimire, Kuber, MD  metoprolol  succinate (TOPROL -XL) 25 MG 24 hr tablet Take 0.5 tablets (12.5 mg total) by mouth See admin instructions. Take 1/2 tablet (12.5mg ) by mouth once daily at noon. Patient taking differently: Take 12.5 mg by mouth daily at 12 noon. 08/06/24   Arlon Carliss ORN, DO  pantoprazole  (PROTONIX ) 40 MG tablet Take 1 tablet (40 mg total) by mouth 2 (two) times daily. 10/02/24   Samtani, Jai-Gurmukh, MD  triamcinolone  ointment (KENALOG ) 0.5 % Apply topically 2 (two) times daily. Patient taking  differently: Apply 1 Application topically 2 (two) times daily as needed (for irritation). 06/24/24   Lucius Krabbe, NP    Allergies: Patient has no known allergies.    Review of Systems  Constitutional:  Negative for fever.  Respiratory:  Positive for shortness of breath.   Cardiovascular:  Negative for chest pain.  Gastrointestinal:  Negative for abdominal pain.  Musculoskeletal:        Left hip pain  All other systems reviewed and are negative.   Updated Vital Signs BP (!) 157/82   Pulse (!) 115   Temp 98.6 F (37 C) (Oral)   Resp 16   SpO2 100%   Physical Exam Vitals and nursing note reviewed.  Constitutional:      Appearance: He is well-developed. He is not ill-appearing.  HENT:     Head: Normocephalic and atraumatic.  Eyes:     Pupils: Pupils are equal, round, and reactive to light.  Cardiovascular:     Rate and Rhythm: Normal rate and regular rhythm.     Heart sounds: Normal heart sounds. No murmur heard. Pulmonary:     Effort: Pulmonary effort is normal. No respiratory distress.     Breath sounds: Wheezing present.     Comments: Wheezing all lung fields, however speaking in full sentences Abdominal:     General: Bowel sounds are normal.     Palpations: Abdomen is soft.     Tenderness: There is no abdominal tenderness. There is no rebound.  Musculoskeletal:     Cervical back: Neck supple.     Comments: Lamination of the left hip with normal range of motion, anterior and incision is clean and dry, there is adjacent erythema  Lymphadenopathy:     Cervical: No cervical adenopathy.  Skin:    General: Skin is warm and dry.  Neurological:     Mental Status: He is alert and oriented to person, place, and time.  Psychiatric:        Mood and Affect: Mood normal.     (all labs ordered are listed, but only abnormal results are displayed) Labs Reviewed  CBC WITH DIFFERENTIAL/PLATELET - Abnormal; Notable for the following components:      Result Value   WBC  11.0 (*)    RBC 3.10 (*)    Hemoglobin 9.8 (*)    HCT 30.9 (*)    RDW 16.1 (*)    Neutro Abs 8.0 (*)    Abs Immature Granulocytes 0.14 (*)    All other components within normal limits  BASIC METABOLIC PANEL WITH GFR - Abnormal; Notable for the following components:   Glucose, Bld 153 (*)    All other components within normal limits  RESP PANEL BY RT-PCR (RSV, FLU A&B, COVID)  RVPGX2  I-STAT CG4 LACTIC ACID, ED    EKG: EKG Interpretation Date/Time:  Saturday October 16 2024 04:31:27 EST Ventricular Rate:  112 PR Interval:  164 QRS Duration:  124 QT Interval:  357 QTC Calculation: 488 R Axis:   -59  Text Interpretation: Sinus tachycardia RBBB and LAFB ST elevation, consider lateral injury Baseline wander in lead(s) V3 Confirmed by Bari Pfeiffer ([id number]) on 10/16/2024 5:52:50 AM  Radiology: [facility] Chest Portable 1 View Result Date: 10/16/2024 CLINICAL DATA:  Shortness of breath. EXAM: PORTABLE CHEST 1 VIEW COMPARISON:  10/07/2024 FINDINGS: The lungs are clear without focal pneumonia, edema, pneumothorax or pleural effusion. The cardiopericardial silhouette is within normal limits for size. No acute bony abnormality. Telemetry leads overlie the chest. Degenerative changes are noted in both shoulders. IMPRESSION: No active disease. Electronically Signed   By: Camellia Candle M.D.   On: 10/16/2024 05:51   DG Hip Unilat W or Wo Pelvis 2-3 Views Left Result Date: 10/16/2024 CLINICAL DATA:  History of left hip pain. Status post left hip arthroplasty. EXAM: DG HIP (WITH OR WITHOUT PELVIS) 2-3V LEFT COMPARISON:  Portable pelvis 10/09/2024. Left hip x-ray 10/08/2024. Portable pelvis 08/27/2024. FINDINGS: Status post left total hip arthroplasty. No complicating features. Degenerative changes are noted in the right hip. Radiopaque foreign body projects over the central pelvis. This was seen over the right abdomen on a study from 10/08/2024 and probably represents a hemostatic clip that has dislodged  into the enteric/fecal stream. IMPRESSION: 1. Status post left total hip arthroplasty without complicating features. 2. Radiopaque foreign body projects over the central pelvis. This was seen over the right abdomen on a study from 10/08/2024 and probably represents a hemostatic clip that has dislodged into the enteric/fecal stream. Electronically Signed   By: Camellia Candle M.D.   On: 10/16/2024 05:51     Procedures   Medications Ordered in the ED  ipratropium-albuterol  (DUONEB) 0.5-2.5 (3) MG/3ML nebulizer solution 3 mL (3 mLs Nebulization Given 10/16/24 0500)  oxyCODONE -acetaminophen  (PERCOCET/ROXICET) 5-325 MG per tablet 1 tablet (1 tablet Oral Given 10/16/24 0459)    Clinical Course as of 10/16/24 0649  Sat Oct 16, 2024  0533 Reviewed.  Patient had pain control issues while in the hospital.  They seem to be acute on chronic.  His wound does not appear infected.  Slightly erythematous but no fluctuance or induration.  Low suspicion for postoperative infection. [CH]  [id number] On recheck, patient is on room air, continues to have some expiratory wheeze but overall improved.  Will give a dose of Decadron . [CH]    Clinical Course User Index [CH] Hoyt Leanos, Pfeiffer FALCON, MD                                 Medical Decision Making Amount and/or Complexity of Data Reviewed Labs: ordered. Radiology: ordered.  Risk Prescription drug management.   This patient presents to the ED for concern of shortness of breath, left hip pain, this involves an extensive number of treatment options, and is a complaint that carries with it a high risk of complications and morbidity.  I considered the following differential and admission for this acute, potentially life threatening condition.  The differential diagnosis includes PUD, pneumonia, viral infection, acute on chronic left hip pain, complication from surgery, cellulitis  MDM:    This is a 78 year old male who presents with shortness of breath.  He is  wheezing on exam.  History of significant COPD.  He is not requiring oxygen and is not using accessory muscles.  He was given a DuoNeb.  Previously given Solu-Medrol  by EMS.  Also  complaining of left hip pain.  After review of his chart, it appears that he has had ongoing and recurrent left hip pain refractory to pain management.  He has not had any fevers.  I have evaluated his incision site.  It is clean and dry.  No fluctuance.  Slight adjacent erythema.  Could be reactive to prior sutures which were removed at discharge several days ago.  Lower suspicion for cellulitis although early cellulitis cannot be excluded.  He has normal range of motion of that hip.  X-rays are reassuring.  Labs obtained.  Chest x-ray without pneumonia.  Labs notable for slight leukocytosis to 11.0.  Otherwise unremarkable.  COVID and flu negative.  On recheck, patient appears more comfortable.  Continues to have some wheezing but overall improved and is 100% on room air.  Patient reports he is currently ambulating with a walker with minimal exertion.  Will be difficult to ambulate with pulse ox.  Will plan for prednisone  at home.  Will also send with a course of doxycycline  to cover both COPD exacerbation and any potential early cellulitis.  Low suspicion for deep space or joint infection.  (Labs, imaging, consults)  Labs: I Ordered, and personally interpreted labs.  The pertinent results include: CBC, BMP, COVID, influenza  Imaging Studies ordered: I ordered imaging studies including chest x-ray, hip x-ray I independently visualized and interpreted imaging. I agree with the radiologist interpretation  Additional history obtained from chart review.  External records from outside source obtained and reviewed including prior evaluations and admissions, op note  Cardiac Monitoring: The patient was maintained on a cardiac monitor.  If on the cardiac monitor, I personally viewed and interpreted the cardiac monitored which  showed an underlying rhythm of: Sinus  Reevaluation: After the interventions noted above, I reevaluated the patient and found that they have :improved  Social Determinants of Health:  lives independently  Disposition: Discharge  Co morbidities that complicate the patient evaluation  Past Medical History:  Diagnosis Date   Acute dyspnea 03/24/2022   Acute exacerbation of chronic obstructive pulmonary disease (COPD) (HCC) 03/25/2022   Asthma    CAD (coronary artery disease) of artery bypass graft 06/13/2016   Chest pain    COPD (chronic obstructive pulmonary disease) (HCC)    COPD with acute exacerbation (HCC) 11/02/2020   Quit smokig 2019  - PFT's  121/22/23  FEV1 1.83 (60 % ) ratio 0.57  p 15 % improvement from saba p 0 prior to study with DLCO  16.4 (65%)   and FV curve mildly concave  - try off acei and on otc gerd rx 06/02/2023 >>>       Depression 09/01/2007   Qualifier: Diagnosis of  By: Nickola CMA, [place]     Elevated troponin 06/13/2016   History of lung cancer 11/02/2020   Hyperlipidemia 11/02/2020   Hypertension    Hypocalcemia 03/24/2022   Leukocytosis 08/05/2024   Prolonged QT interval 11/02/2020   SOB (shortness of breath) 06/12/2024   Stress-induced cardiomyopathy 06/13/2016   TOBACCO ABUSE 09/01/2007   Qualifier: Diagnosis of   By: Nickola CMA, [place]           Medicines Meds ordered this encounter  Medications   ipratropium-albuterol  (DUONEB) 0.5-2.5 (3) MG/3ML nebulizer solution 3 mL   oxyCODONE -acetaminophen  (PERCOCET/ROXICET) 5-325 MG per tablet 1 tablet    Refill:  0   predniSONE  (DELTASONE ) 20 MG tablet    Sig: Take 2 tablets (40 mg total) by mouth daily.    Dispense:  8 tablet    Refill:  0   doxycycline  (VIBRAMYCIN ) 100 MG capsule    Sig: Take 1 capsule (100 mg total) by mouth 2 (two) times daily.    Dispense:  20 capsule    Refill:  0    I have reviewed the patients home medicines and have made adjustments as needed  Problem List / ED  Course: Problem List Items Addressed This Visit   None Visit Diagnoses       Chronic obstructive pulmonary disease with acute exacerbation (HCC)    -  Primary   Relevant Medications   ipratropium-albuterol  (DUONEB) 0.5-2.5 (3) MG/3ML nebulizer solution 3 mL (Completed)   predniSONE  (DELTASONE ) 20 MG tablet     Left hip pain                    Final diagnoses:  Chronic obstructive pulmonary disease with acute exacerbation (HCC)  Left hip pain    ED Discharge Orders          Ordered    predniSONE  (DELTASONE ) 20 MG tablet  Daily        10/16/24 0645    doxycycline  (VIBRAMYCIN ) 100 MG capsule  2 times daily        10/16/24 0645               Faustine Tates, Charmaine FALCON, MD 10/16/24 [phone]  "

## 2024-10-16 NOTE — Discharge Instructions (Signed)
 You were seen today for shortness of breath and ongoing left hip pain.  Your workup is largely reassuring.  You likely have COPD.  Take prednisone  as prescribed.  Additionally, your left hip x-rays are reassuring.  You only have some slight redness at the wound site.  Placed on several days of doxycycline  to cover for possible early infection although does not appear overtly infected.  Follow-up with Dr. Jerri.

## 2024-10-16 NOTE — ED Notes (Signed)
 [facility] called

## 2024-10-16 NOTE — ED Notes (Signed)
 Writer spoke with pts wife who will be coming to transport pt home.

## 2024-10-18 ENCOUNTER — Telehealth: Payer: Self-pay

## 2024-10-18 ENCOUNTER — Telehealth: Payer: Self-pay | Admitting: *Deleted

## 2024-10-18 ENCOUNTER — Telehealth: Payer: Self-pay | Admitting: Orthopaedic Surgery

## 2024-10-18 NOTE — Telephone Encounter (Signed)
 Patient called to schedule a post op from surgery visit and states his surgery was on 08/27/24 and does not know when he needs to follow up. Pt's wife states someone came by while pt was in the hospital and said he did not need to follow up. But pt states his d/c summary states to follow up. Please advise if appointment is needed and when should he follow up.

## 2024-10-18 NOTE — Telephone Encounter (Signed)
 Called patient. Scheduled for follow up this Friday with Morna.

## 2024-10-18 NOTE — Telephone Encounter (Signed)
"  Called and scheduled for follow up  "

## 2024-10-18 NOTE — Telephone Encounter (Signed)
 Pt called triage stating he needs a follow up appointment and would like more pain medication. [facility] on [address]

## 2024-10-18 NOTE — Transitions of Care (Post Inpatient/ED Visit) (Signed)
" ° °  10/18/2024  Name: Gary Wright MRN: [id number] DOB: 10/09/1946  Today's TOC FU Call Status: Today's TOC FU Call Status:: Unsuccessful Call (3rd Attempt) Unsuccessful Call (3rd Attempt) Date: 10/18/24  Attempted to reach the patient regarding the most recent Inpatient/ED visit.  Follow Up Plan: No further outreach attempts will be made at this time. We have been unable to contact the patient.  Mliss Creed [facility], BSN RN Care Manager/ Transition of Care [place]/ [facility] [phone]'  "

## 2024-10-19 ENCOUNTER — Other Ambulatory Visit (HOSPITAL_COMMUNITY): Payer: Self-pay

## 2024-10-19 ENCOUNTER — Other Ambulatory Visit: Payer: Self-pay

## 2024-10-19 NOTE — Progress Notes (Signed)
 Specialty Pharmacy Refill Coordination Note  Gary Wright is a 78 y.o. male contacted today regarding refills of specialty medication(s) Dupilumab  (Dupixent )   Patient requested Delivery   Delivery date: 10/26/24   Verified address: [address]   [place] [id number]   Medication will be filled on: 10/25/24

## 2024-10-22 ENCOUNTER — Other Ambulatory Visit

## 2024-10-22 ENCOUNTER — Ambulatory Visit: Admitting: Physician Assistant

## 2024-10-22 DIAGNOSIS — M25552 Pain in left hip: Secondary | ICD-10-CM

## 2024-10-22 NOTE — Progress Notes (Signed)
 "  Post-Op Visit Note   Patient: Gary Wright           Date of Birth: September 15, 1946           MRN: [id number] Visit Date: 10/22/2024 PCP: Lucius Krabbe, NP   Assessment & Plan:  Chief Complaint:  Chief Complaint  Patient presents with   Left Hip - Pain    L THA- 08/27/2024   Visit Diagnoses:  1. Pain in left hip     Plan: Patient is a pleasant 79 year old gentleman who comes in today approximately 8 weeks status post left total hip replacement.  He has been doing well in regards to the hip.  He is primarily ambulating without a cane.  He does note some paresthesias to the lateral hip but nothing else.  Examination of left hip reveals a well-healed surgical incision with a small scab to the lateral to the mid incision.  No evidence of infection or cellulitis.  Calves are soft nontender.  He is neurovascular intact distally.  At this point, he will continue to advance with activity as tolerated.  Dental prophylaxis reinforced.  Follow-up in about 5 weeks for recheck.  Call with concerns or questions.  Follow-Up Instructions: Return in about 5 weeks (around 11/26/2024).   Orders:  Orders Placed This Encounter  Procedures   XR HIP UNILAT W OR W/O PELVIS 2-3 VIEWS LEFT   No orders of the defined types were placed in this encounter.   Imaging: XR HIP UNILAT W OR W/O PELVIS 2-3 VIEWS LEFT Result Date: 10/22/2024 Well-seated prosthesis without complication   PMFS History: Patient Active Problem List   Diagnosis Date Noted   Malnutrition of moderate degree 10/09/2024   Pyuria 10/08/2024   Sepsis (HCC) 10/08/2024   AVM (arteriovenous malformation) of small bowel, acquired 09/30/2024   Benign neoplasm of colon 09/30/2024   Heme positive stool 09/29/2024   Anticoagulated 09/29/2024   COPD exacerbation (HCC) 09/26/2024   Acute respiratory distress 09/26/2024   History of pulmonary embolism 09/26/2024   Anemia 09/26/2024   Diastolic dysfunction 09/26/2024   Physical  deconditioning 09/26/2024   Acute pulmonary embolism (HCC) 09/20/2024   Avascular necrosis of bone of left hip (HCC) 08/20/2024   Leukocytosis 08/05/2024   History of bacteremia 08/05/2024   CAP (community acquired pneumonia) 08/02/2024   Chronic hip pain, bilateral 01/26/2024   Hoarseness 12/22/2023   Drug-induced diabetes mellitus 11/26/2023   Insomnia due to medical condition 11/26/2023   Fungal skin infection 11/04/2023   Asthma-COPD overlap syndrome (HCC) 11/04/2023   Chronic kidney disease, stage 3b (HCC) 07/31/2023   Allergic asthma 07/28/2023   Allergic rhinitis 07/17/2023   Former smoker 07/17/2023   Borderline type 2 diabetes mellitus 03/24/2022   COPD with acute exacerbation (HCC) 11/02/2020   History of lung cancer 11/02/2020   Prolonged QT interval 11/02/2020   Hyperlipidemia 11/02/2020   Acute respiratory failure with hypoxia (HCC) 11/02/2020   Stress-induced cardiomyopathy 06/13/2016   History of pneumonia 06/13/2016   Opioid dependence (HCC) 06/13/2016   Mild CAD 06/13/2016   Depression 09/01/2007   Essential hypertension 09/01/2007   TONSILLECTOMY, HX OF 09/01/2007   Past Medical History:  Diagnosis Date   Acute dyspnea 03/24/2022   Acute exacerbation of chronic obstructive pulmonary disease (COPD) (HCC) 03/25/2022   Asthma    CAD (coronary artery disease) of artery bypass graft 06/13/2016   Chest pain    COPD (chronic obstructive pulmonary disease) (HCC)    COPD with acute exacerbation (HCC) 11/02/2020  Quit smokig 2019  - PFT's  121/22/23  FEV1 1.83 (60 % ) ratio 0.57  p 15 % improvement from saba p 0 prior to study with DLCO  16.4 (65%)   and FV curve mildly concave  - try off acei and on otc gerd rx 06/02/2023 >>>       Depression 09/01/2007   Qualifier: Diagnosis of  By: Nickola CMA, [place]     Elevated troponin 06/13/2016   History of lung cancer 11/02/2020   Hyperlipidemia 11/02/2020   Hypertension    Hypocalcemia 03/24/2022   Leukocytosis  08/05/2024   Prolonged QT interval 11/02/2020   SOB (shortness of breath) 06/12/2024   Stress-induced cardiomyopathy 06/13/2016   TOBACCO ABUSE 09/01/2007   Qualifier: Diagnosis of   By: Nickola CMA, [place]          Family History  Problem Relation Age of Onset   Hypertension Mother    Hypertension Father     Past Surgical History:  Procedure Laterality Date   BACK SURGERY     fusion in 1973   CARDIAC CATHETERIZATION N/A 06/13/2016   Procedure: Left Heart Cath and Coronary Angiography;  Surgeon: Lonni Hanson, MD;  Location: [facility] INVASIVE CV LAB;  Service: Cardiovascular;  Laterality: N/A;   COLONOSCOPY N/A 09/30/2024   Procedure: COLONOSCOPY;  Surgeon: Leigh Elspeth SQUIBB, MD;  Location: [facility] ENDOSCOPY;  Service: Gastroenterology;  Laterality: N/A;   ESOPHAGOGASTRODUODENOSCOPY N/A 09/30/2024   Procedure: EGD (ESOPHAGOGASTRODUODENOSCOPY);  Surgeon: Leigh Elspeth SQUIBB, MD;  Location: [facility] ENDOSCOPY;  Service: Gastroenterology;  Laterality: N/A;   HEMOSTASIS CLIP PLACEMENT  09/30/2024   Procedure: CONTROL OF HEMORRHAGE, GI TRACT, ENDOSCOPIC, BY CLIPPING OR OVERSEWING;  Surgeon: Leigh Elspeth SQUIBB, MD;  Location: [facility] ENDOSCOPY;  Service: Gastroenterology;;   HOT HEMOSTASIS N/A 09/30/2024   Procedure: EGD, WITH ARGON PLASMA COAGULATION;  Surgeon: Leigh Elspeth SQUIBB, MD;  Location: [facility] ENDOSCOPY;  Service: Gastroenterology;  Laterality: N/A;   INGUINAL HERNIA REPAIR Right 04/21/2018   Procedure: RIGHT INGUINAL HERNIA REPAIR ERAS PATHWAY;  Surgeon: Vanderbilt Ned, MD;  Location: [facility] OR;  Service: General;  Laterality: Right;  TAP BLOCK   INSERTION OF MESH Right 04/21/2018   Procedure: INSERTION OF MESH;  Surgeon: Vanderbilt Ned, MD;  Location: [facility] OR;  Service: General;  Laterality: Right;   LUNG REMOVAL, PARTIAL Left    left upper lung removed about 6-7 years ago reported by pt   POLYPECTOMY  09/30/2024   Procedure: POLYPECTOMY, INTESTINE;  Surgeon: Leigh Elspeth SQUIBB, MD;  Location:  [facility] ENDOSCOPY;  Service: Gastroenterology;;   TONSILLECTOMY     TOTAL HIP ARTHROPLASTY Left 08/27/2024   Procedure: ARTHROPLASTY, HIP, TOTAL, ANTERIOR APPROACH;  Surgeon: Jerri Kay HERO, MD;  Location: [facility] OR;  Service: Orthopedics;  Laterality: Left;   Social History   Occupational History   Occupation: retired    Associate Professor: [employer]  Tobacco Use   Smoking status: Former    Current packs/day: 0.00    Average packs/day: 0.5 packs/day    Types: Cigarettes    Quit date: 2019    Years since quitting: 7.0   Smokeless tobacco: Never  Vaping Use   Vaping status: Never Used  Substance and Sexual Activity   Alcohol use: No    Comment: No alcohol for 17 years   Drug use: No   Sexual activity: Not on file     "

## 2024-10-25 ENCOUNTER — Other Ambulatory Visit: Payer: Self-pay

## 2024-11-01 ENCOUNTER — Ambulatory Visit: Payer: Self-pay

## 2024-11-01 NOTE — Telephone Encounter (Signed)
 Reviewed

## 2024-11-01 NOTE — Telephone Encounter (Signed)
 FYI Only or Action Required?: FYI only for provider: appointment scheduled on 1/14 per pt preference.  Patient was last seen in primary care on 09/20/2024 by Lucius Krabbe, NP.  Called Nurse Triage reporting Joint Swelling and Rash.  Symptoms began several months ago.  Interventions attempted: Other: appts for rash per pt.  Symptoms are: gradually worsening.  Triage Disposition: See HCP Within 4 Hours (Or PCP Triage)  Patient/caregiver understands and will follow disposition?: No, wishes to speak with PCP     Copied from CRM #[id number]. Topic: Clinical - Red Word Triage >> Nov 01, 2024  3:03 PM Gary Wright wrote: Red Word that prompted transfer to Nurse Triage: Patient called requesting an appointment for swelling in left ankle and rash on both ankles. Reason for Disposition  [1] Thigh, calf, or ankle swelling AND [2] only 1 side  (Exceptions: Itchy, localized swelling; swelling is chronic.)  Answer Assessment - Initial Assessment Questions This RN recommended pt be examined in next 4 hours, pt refusing disposition, refusing mobile clinic/UC, scheduled earliest appt pt would accept with PCP per pt preference. Advised pt call back or seek immediate care if any new or worsening symptoms. Sending message to PCP office for call back to pt with further recommendations.    Significant hx: PE, recent hip replacement on left leg, COPD  Symptoms: Ongoing rash to bilateral ankles getting worse - redness, predates swelling Swelling to left ankle for over a month - can still move ankle  Denies: Worse SOB than usual with COPD Chest pain Ankle pain Fever No bumps or blisters to rash Spreading/streaking redness  Protocols used: Ankle Swelling-A-AH

## 2024-11-03 ENCOUNTER — Ambulatory Visit: Payer: Self-pay | Admitting: Pulmonary Disease

## 2024-11-03 ENCOUNTER — Encounter: Payer: Self-pay | Admitting: Family

## 2024-11-03 ENCOUNTER — Encounter: Payer: Self-pay | Admitting: Pulmonary Disease

## 2024-11-03 ENCOUNTER — Ambulatory Visit: Admitting: Pulmonary Disease

## 2024-11-03 ENCOUNTER — Telehealth: Payer: Self-pay

## 2024-11-03 ENCOUNTER — Ambulatory Visit: Admitting: Family

## 2024-11-03 VITALS — BP 148/90 | HR 122 | Ht 68.0 in | Wt 181.2 lb

## 2024-11-03 VITALS — BP 124/74 | HR 125 | Temp 97.0°F | Ht 68.0 in | Wt 179.4 lb

## 2024-11-03 DIAGNOSIS — J4489 Other specified chronic obstructive pulmonary disease: Secondary | ICD-10-CM | POA: Diagnosis not present

## 2024-11-03 DIAGNOSIS — B369 Superficial mycosis, unspecified: Secondary | ICD-10-CM

## 2024-11-03 DIAGNOSIS — Z87891 Personal history of nicotine dependence: Secondary | ICD-10-CM | POA: Diagnosis not present

## 2024-11-03 DIAGNOSIS — M7989 Other specified soft tissue disorders: Secondary | ICD-10-CM | POA: Diagnosis not present

## 2024-11-03 MED ORDER — FUROSEMIDE 20 MG PO TABS: 20.0000 mg | ORAL_TABLET | Freq: Every day | ORAL | 5 refills | Status: AC | PRN

## 2024-11-03 MED ORDER — BUDESONIDE 0.5 MG/2ML IN SUSP: 0.5000 mg | Freq: Two times a day (BID) | RESPIRATORY_TRACT | 11 refills | Status: AC

## 2024-11-03 MED ORDER — PREDNISONE 10 MG PO TABS: ORAL_TABLET | ORAL | 0 refills | Status: AC

## 2024-11-03 MED ORDER — ARFORMOTEROL TARTRATE 15 MCG/2ML IN NEBU: 15.0000 ug | INHALATION_SOLUTION | Freq: Two times a day (BID) | RESPIRATORY_TRACT | 11 refills | Status: AC

## 2024-11-03 MED ORDER — AZITHROMYCIN 250 MG PO TABS: ORAL_TABLET | ORAL | 0 refills | Status: AC

## 2024-11-03 MED ORDER — REVEFENACIN 175 MCG/3ML IN SOLN: 175.0000 ug | Freq: Every day | RESPIRATORY_TRACT | 11 refills | Status: AC

## 2024-11-03 MED ORDER — TERBINAFINE HCL 1 % EX CREA: 1.0000 | TOPICAL_CREAM | Freq: Two times a day (BID) | CUTANEOUS | 5 refills | Status: AC

## 2024-11-03 NOTE — Progress Notes (Signed)
 "  Patient ID: Gary Wright, male    DOB: [birth year], 79 y.o.   MRN: [id number]  Chief Complaint  Patient presents with   Foot Swelling   Leg Swelling   Rash    Swelling on and off for a couple months. Also has a rash both legs. Wanting to see about getting some steroids.   HPI: He was on prednisone  again after a COPD exacerbation twice in December and remains on his continuous 10mg  dose qd, which raises his blood sugars managed with Glipizide .  He reports his left leg and foot have been swelling again along with the rash worsening on both ankles & feet. He states the clotrimazole -betamethasone  cream works better than the triamcinolone  ointment.  ASSESSMENT & PLAN: 1. Leg swelling (Primary) Previously on Lasix  but d/c in ER back in October, possibly d/t mildly low NA, advised to restart and only take for 3 mornings at a time, if not working let me know.  - furosemide  (LASIX ) 20 MG tablet; Take 1 tablet (20 mg total) by mouth daily as needed for edema or fluid (Leg swelling. Take for 3 days at a time.).  Dispense: 30 tablet; Refill: 5  2. Fungal skin infection Switching to Lamisil  from Lotrimin . Advised on use, apply the steroid ointment (triamcinolone ) over this cream, let me know if not helping.   - terbinafine  (LAMISIL ) 1 % cream; Apply 1 Application topically 2 (two) times daily for 14 days. Apply to skin rash on feet for 2 weeks, ok to stop sooner if rash resolved.  Dispense: 42 g; Refill: 5   Subjective:    Outpatient Medications Prior to Visit  Medication Sig Dispense Refill   albuterol  (VENTOLIN  HFA) 108 (90 Base) MCG/ACT inhaler Inhale 2 puffs into the lungs every 6 (six) hours as needed for wheezing or shortness of breath.     apixaban  (ELIQUIS ) 5 MG TABS tablet Take 1 tablet (5 mg total) by mouth 2 (two) times daily. 60 tablet 6   buPROPion  (WELLBUTRIN  XL) 300 MG 24 hr tablet Take 300 mg by mouth every morning.     citalopram  (CELEXA ) 20 MG tablet Take 1 tablet (20 mg  total) by mouth daily. 30 tablet 6   clotrimazole -betamethasone  (LOTRISONE ) cream Apply topically 2 (two) times daily.     Dupilumab  (DUPIXENT ) 300 MG/2ML SOAJ Inject 300 mg into the skin every 14 (fourteen) days. 4 mL 3   Fluticasone -Umeclidin-Vilant (TRELEGY ELLIPTA ) 200-62.5-25 MCG/ACT AEPB Inhale 1 puff into the lungs every evening. 1 each 1   gabapentin  (NEURONTIN ) 100 MG capsule Take 1 capsule (100 mg total) by mouth 2 (two) times daily. 60 capsule 0   glipiZIDE  (GLUCOTROL  XL) 5 MG 24 hr tablet Take 1 tablet (5 mg total) by mouth 2 (two) times daily after a meal. 180 tablet 1   ipratropium-albuterol  (DUONEB) 0.5-2.5 (3) MG/3ML SOLN Take 3 mLs by nebulization every 6 (six) hours as needed. (Patient taking differently: Take 3 mLs by nebulization every 6 (six) hours as needed (for shortness of breath or wheezing).) 360 mL 5   lidocaine  (LIDODERM ) 5 % Place 1 patch onto the skin daily. Remove & Discard patch within 12 hours or as directed by MD 30 patch 0   metoprolol  succinate (TOPROL -XL) 25 MG 24 hr tablet Take 0.5 tablets (12.5 mg total) by mouth See admin instructions. Take 1/2 tablet (12.5mg ) by mouth once daily at noon. (Patient taking differently: Take 12.5 mg by mouth daily at 12 noon.)     predniSONE  (DELTASONE )  10 MG tablet Take 10 mg by mouth daily.     triamcinolone  ointment (KENALOG ) 0.5 % Apply topically 2 (two) times daily. (Patient taking differently: Apply 1 Application topically 2 (two) times daily as needed (for irritation).) 30 g 11   atorvastatin  (LIPITOR) 40 MG tablet TAKE 1 TABLET(40 MG) BY MOUTH DAILY AT 6 PM (Patient not taking: Reported on 11/03/2024) 90 tablet 3   doxycycline  (VIBRAMYCIN ) 100 MG capsule Take 1 capsule (100 mg total) by mouth 2 (two) times daily. (Patient not taking: Reported on 11/03/2024) 20 capsule 0   pantoprazole  (PROTONIX ) 40 MG tablet Take 1 tablet (40 mg total) by mouth 2 (two) times daily. (Patient not taking: Reported on 11/03/2024) 60 tablet 0    predniSONE  (DELTASONE ) 20 MG tablet Take 2 tablets (40 mg total) by mouth daily. (Patient not taking: Reported on 11/03/2024) 8 tablet 0   acetaminophen  (TYLENOL ) 325 MG tablet Take 1-2 tablets (325-650 mg total) by mouth every 6 (six) hours as needed for mild pain (pain score 1-3) or fever (or temp > 100.5). (Patient not taking: Reported on 10/09/2024)     HYDROcodone -acetaminophen  (NORCO/VICODIN) 5-325 MG tablet Take 1 tablet by mouth every 4 (four) hours as needed for moderate pain (pain score 4-6). (Patient not taking: Reported on 11/03/2024) 30 tablet 0   No facility-administered medications prior to visit.   Past Medical History:  Diagnosis Date   Acute dyspnea 03/24/2022   Acute exacerbation of chronic obstructive pulmonary disease (COPD) (HCC) 03/25/2022   Asthma    CAD (coronary artery disease) of artery bypass graft 06/13/2016   Chest pain    COPD (chronic obstructive pulmonary disease) (HCC)    COPD with acute exacerbation (HCC) 11/02/2020   Quit smokig 2019  - PFT's  121/22/23  FEV1 1.83 (60 % ) ratio 0.57  p 15 % improvement from saba p 0 prior to study with DLCO  16.4 (65%)   and FV curve mildly concave  - try off acei and on otc gerd rx 06/02/2023 >>>       Depression 09/01/2007   Qualifier: Diagnosis of  By: Nickola CMA, [place]     Elevated troponin 06/13/2016   History of lung cancer 11/02/2020   Hyperlipidemia 11/02/2020   Hypertension    Hypocalcemia 03/24/2022   Leukocytosis 08/05/2024   Prolonged QT interval 11/02/2020   SOB (shortness of breath) 06/12/2024   Stress-induced cardiomyopathy 06/13/2016   TOBACCO ABUSE 09/01/2007   Qualifier: Diagnosis of   By: Nickola CMA, [place]         Past Surgical History:  Procedure Laterality Date   BACK SURGERY     fusion in 1973   CARDIAC CATHETERIZATION N/A 06/13/2016   Procedure: Left Heart Cath and Coronary Angiography;  Surgeon: Lonni Hanson, MD;  Location: [facility] INVASIVE CV LAB;  Service: Cardiovascular;  Laterality:  N/A;   COLONOSCOPY N/A 09/30/2024   Procedure: COLONOSCOPY;  Surgeon: Leigh Elspeth SQUIBB, MD;  Location: [facility] ENDOSCOPY;  Service: Gastroenterology;  Laterality: N/A;   ESOPHAGOGASTRODUODENOSCOPY N/A 09/30/2024   Procedure: EGD (ESOPHAGOGASTRODUODENOSCOPY);  Surgeon: Leigh Elspeth SQUIBB, MD;  Location: [facility] ENDOSCOPY;  Service: Gastroenterology;  Laterality: N/A;   HEMOSTASIS CLIP PLACEMENT  09/30/2024   Procedure: CONTROL OF HEMORRHAGE, GI TRACT, ENDOSCOPIC, BY CLIPPING OR OVERSEWING;  Surgeon: Leigh Elspeth SQUIBB, MD;  Location: [facility] ENDOSCOPY;  Service: Gastroenterology;;   HOT HEMOSTASIS N/A 09/30/2024   Procedure: EGD, WITH ARGON PLASMA COAGULATION;  Surgeon: Leigh Elspeth SQUIBB, MD;  Location: [facility] ENDOSCOPY;  Service: Gastroenterology;  Laterality: N/A;   INGUINAL HERNIA REPAIR Right 04/21/2018   Procedure: RIGHT INGUINAL HERNIA REPAIR ERAS PATHWAY;  Surgeon: Vanderbilt Ned, MD;  Location: [facility] OR;  Service: General;  Laterality: Right;  TAP BLOCK   INSERTION OF MESH Right 04/21/2018   Procedure: INSERTION OF MESH;  Surgeon: Vanderbilt Ned, MD;  Location: [facility] OR;  Service: General;  Laterality: Right;   LUNG REMOVAL, PARTIAL Left    left upper lung removed about 6-7 years ago reported by pt   POLYPECTOMY  09/30/2024   Procedure: POLYPECTOMY, INTESTINE;  Surgeon: Leigh Elspeth SQUIBB, MD;  Location: [facility] ENDOSCOPY;  Service: Gastroenterology;;   TONSILLECTOMY     TOTAL HIP ARTHROPLASTY Left 08/27/2024   Procedure: ARTHROPLASTY, HIP, TOTAL, ANTERIOR APPROACH;  Surgeon: Jerri Kay HERO, MD;  Location: [facility] OR;  Service: Orthopedics;  Laterality: Left;   Allergies[1]    Objective:    Physical Exam Vitals and nursing note reviewed.  Constitutional:      General: He is not in acute distress.    Appearance: Normal appearance.  HENT:     Head: Normocephalic.  Cardiovascular:     Rate and Rhythm: Normal rate and regular rhythm.  Pulmonary:     Effort: Pulmonary effort is normal.     Breath  sounds: Normal breath sounds.  Musculoskeletal:        General: Normal range of motion.     Cervical back: Normal range of motion.     Right lower leg: No edema.     Left lower leg: 3+ Edema present.  Skin:    General: Skin is warm and dry.     Findings: Erythema (right ankle/foot > left, approx 10cm in diameter, inflamed, diffuse erythema) and rash (right ankle & foot with scaling & diffuse erythema, ringworm appearance today) present. Rash is urticarial.  Neurological:     Mental Status: He is alert and oriented to person, place, and time.  Psychiatric:        Mood and Affect: Mood normal.    BP 124/74 (BP Location: Right Arm, Patient Position: Sitting, Cuff Size: Normal)   Pulse (!) 125   Temp (!) 97 F (36.1 C) (Temporal)   Ht 5' 8 (1.727 m)   Wt 179 lb 6.4 oz (81.4 kg)   SpO2 97%   BMI 27.28 kg/m  Wt Readings from Last 3 Encounters:  11/03/24 179 lb 6.4 oz (81.4 kg)  10/07/24 173 lb 11.6 oz (78.8 kg)  09/26/24 173 lb (78.5 kg)      Aspin Palomarez, NP     [1] No Known Allergies  "

## 2024-11-03 NOTE — Telephone Encounter (Signed)
 Ohtuvayre  paperwork handed into pharmacy

## 2024-11-03 NOTE — Assessment & Plan Note (Addendum)
" °  Orders:   budesonide  (PULMICORT ) 0.5 MG/2ML nebulizer solution; Take 2 mLs (0.5 mg total) by nebulization 2 (two) times daily.   arformoterol  (BROVANA ) 15 MCG/2ML NEBU; Take 2 mLs (15 mcg total) by nebulization 2 (two) times daily.   revefenacin  (YUPELRI ) 175 MCG/3ML nebulizer solution; Take 3 mLs (175 mcg total) by nebulization daily.   predniSONE  (DELTASONE ) 10 MG tablet; Take 4 tablets (40 mg total) by mouth daily with breakfast for 3 days, THEN 3 tablets (30 mg total) daily with breakfast for 3 days, THEN 2 tablets (20 mg total) daily with breakfast for 3 days, THEN 1 tablet (10 mg total) daily with breakfast for 3 days.   azithromycin  (ZITHROMAX ) 250 MG tablet; Take as directed   Ambulatory referral to Pharmacotherapy Clinic  "

## 2024-11-03 NOTE — Assessment & Plan Note (Addendum)
 Gary Wright

## 2024-11-03 NOTE — Progress Notes (Signed)
 "  Established Patient Pulmonology Office Visit   Subjective:  Patient ID: Gary Wright, male    DOB: 05-01-1946  MRN: [id number]  CC:  Chief Complaint  Patient presents with   Medical Management of Chronic Issues    Acute - SOB 20mg  pred not helping whatsoever    Discussed the use of AI scribe software for clinical note transcription with the patient, who gave verbal consent to proceed.  History of Present Illness Gary Wright is a 79 year old male, former smoker with CAD, hypertension, hyperlipidemia and lung cancer s/p LUL lobectomy 10/30/2018 who returns to pulmonary clinic for COPD and progressive dyspnea.  He reports severe shortness of breath and wheezing, described as fighting to breathe. Prednisone  20 mg daily for the past couple of months has not improved symptoms. He continues Dupixent  every 14 days.  He has no cough and only minimal clear mucus. He does not use home oxygen and says his oxygen levels have been good. He quit smoking many years ago.  He takes Trelegy (one puff daily), Dupixent , and albuterol  as needed. He was recently started on a short 3-day course of Lasix  for leg swelling.  He underwent total hip replacement about six weeks ago and reports improved mobility without major postoperative issues that limit his breathing evaluation today.        ROS   Current Medications[1]      Objective:  BP (!) 148/90   Pulse (!) 122   Ht 5' 8 (1.727 m) Comment: per pt  Wt 181 lb 3.2 oz (82.2 kg)   SpO2 99%   BMI 27.55 kg/m     Physical Exam Constitutional:      General: He is not in acute distress.    Appearance: Normal appearance.  Eyes:     General: No scleral icterus.    Conjunctiva/sclera: Conjunctivae normal.  Cardiovascular:     Rate and Rhythm: Normal rate and regular rhythm.  Pulmonary:     Breath sounds: Decreased air movement present. No wheezing, rhonchi or rales.  Musculoskeletal:     Right lower leg: No edema.     Left  lower leg: No edema.  Skin:    General: Skin is warm and dry.  Neurological:     General: No focal deficit present.      Diagnostic Review:  Last CBC Lab Results  Component Value Date   WBC 11.0 (H) 10/16/2024   HGB 9.8 (L) 10/16/2024   HCT 30.9 (L) 10/16/2024   MCV 99.7 10/16/2024   MCH 31.6 10/16/2024   RDW 16.1 (H) 10/16/2024   PLT 373 10/16/2024   Last metabolic panel Lab Results  Component Value Date   GLUCOSE 153 (H) 10/16/2024   NA 139 10/16/2024   K 3.7 10/16/2024   CL 102 10/16/2024   CO2 26 10/16/2024   BUN 11 10/16/2024   CREATININE 0.90 10/16/2024   GFRNONAA >60 10/16/2024   CALCIUM  8.9 10/16/2024   PHOS 2.8 10/08/2024   PROT 5.5 (L) 10/08/2024   ALBUMIN  3.3 (L) 10/08/2024   LABGLOB 1.8 01/18/2021   AGRATIO 2.6 (H) 01/18/2021   BILITOT 0.5 10/08/2024   ALKPHOS 56 10/08/2024   AST 17 10/08/2024   ALT 20 10/08/2024   ANIONGAP 11 10/16/2024   CTA Chest 10/07/24 1. Single small segmental right lower lobe pulmonary embolism, unchanged from prior study. All other pulmonary emboli have resolved in the interval. No new pulmonary emboli. 2. Mild ground glass opacities within the bilateral lower lobes  and right upper lobe may represent mild edema or infectious/inflammatory process . 3. Mild to moderate emphysema. Pulmonary emphysema is an independent risk factor for lung cancer. Recommend consideration for evaluation for a low-dose CT lung cancer screening program. 4. Calcified granuloma in the left upper lobe. 5. Small hiatal hernia.    Assessment & Plan:   Assessment & Plan Asthma-COPD overlap syndrome (HCC)  Orders:   budesonide  (PULMICORT ) 0.5 MG/2ML nebulizer solution; Take 2 mLs (0.5 mg total) by nebulization 2 (two) times daily.   arformoterol  (BROVANA ) 15 MCG/2ML NEBU; Take 2 mLs (15 mcg total) by nebulization 2 (two) times daily.   revefenacin  (YUPELRI ) 175 MCG/3ML nebulizer solution; Take 3 mLs (175 mcg total) by nebulization daily.    predniSONE  (DELTASONE ) 10 MG tablet; Take 4 tablets (40 mg total) by mouth daily with breakfast for 3 days, THEN 3 tablets (30 mg total) daily with breakfast for 3 days, THEN 2 tablets (20 mg total) daily with breakfast for 3 days, THEN 1 tablet (10 mg total) daily with breakfast for 3 days.   azithromycin  (ZITHROMAX ) 250 MG tablet; Take as directed   Ambulatory referral to Pharmacotherapy Clinic  Former smoker      Assessment and Plan Assessment & Plan Asthma-COPD overlap syndrome with acute exacerbation Acute exacerbation with severe dyspnea and wheezing. Current treatment includes Trelegy, Dupixent , and albuterol . Prednisone  20 mg daily ineffective. Adequate oxygen saturation without supplemental oxygen. Smoking cessation achieved. Considering long-acting nebulizer due to inhalation difficulty. - Prescribed Ohtuvayre  nebulizer treatment twice daily. - Instructed to discontinue Trelegy upon initiation of nebulizer treatments. - Start budesonide , brovana  and yupelri  nebulizer treatments - Continue Dupixent  injections every 14 days. - Prescribed prednisone  taper. - Prescribed Z-Pak. - Continue as-needed albuterol  via inhaler or nebulizer. - Scheduled follow-up appointment in 3 months.      Return in about 3 months (around 02/01/2025) for f/u visit Dr. [name].   Dorn KATHEE [name], MD     [1]  Current Outpatient Medications:    albuterol  (VENTOLIN  HFA) 108 (90 Base) MCG/ACT inhaler, Inhale 2 puffs into the lungs every 6 (six) hours as needed for wheezing or shortness of breath., Disp: , Rfl:    apixaban  (ELIQUIS ) 5 MG TABS tablet, Take 1 tablet (5 mg total) by mouth 2 (two) times daily., Disp: 60 tablet, Rfl: 6   arformoterol  (BROVANA ) 15 MCG/2ML NEBU, Take 2 mLs (15 mcg total) by nebulization 2 (two) times daily., Disp: 120 mL, Rfl: 11   azithromycin  (ZITHROMAX ) 250 MG tablet, Take as directed, Disp: 6 tablet, Rfl: 0   budesonide  (PULMICORT ) 0.5 MG/2ML nebulizer solution, Take 2  mLs (0.5 mg total) by nebulization 2 (two) times daily., Disp: 120 mL, Rfl: 11   buPROPion  (WELLBUTRIN  XL) 300 MG 24 hr tablet, Take 300 mg by mouth every morning., Disp: , Rfl:    citalopram  (CELEXA ) 20 MG tablet, Take 1 tablet (20 mg total) by mouth daily., Disp: 30 tablet, Rfl: 6   clotrimazole -betamethasone  (LOTRISONE ) cream, Apply topically 2 (two) times daily., Disp: , Rfl:    Dupilumab  (DUPIXENT ) 300 MG/2ML SOAJ, Inject 300 mg into the skin every 14 (fourteen) days., Disp: 4 mL, Rfl: 3   furosemide  (LASIX ) 20 MG tablet, Take 1 tablet (20 mg total) by mouth daily as needed for edema or fluid (Leg swelling. Take for 3 days at a time.)., Disp: 30 tablet, Rfl: 5   gabapentin  (NEURONTIN ) 100 MG capsule, Take 1 capsule (100 mg total) by mouth 2 (two) times daily., Disp: 60 capsule,  Rfl: 0   glipiZIDE  (GLUCOTROL  XL) 5 MG 24 hr tablet, Take 1 tablet (5 mg total) by mouth 2 (two) times daily after a meal., Disp: 180 tablet, Rfl: 1   ipratropium-albuterol  (DUONEB) 0.5-2.5 (3) MG/3ML SOLN, Take 3 mLs by nebulization every 6 (six) hours as needed. (Patient taking differently: Take 3 mLs by nebulization every 6 (six) hours as needed (for shortness of breath or wheezing).), Disp: 360 mL, Rfl: 5   lidocaine  (LIDODERM ) 5 %, Place 1 patch onto the skin daily. Remove & Discard patch within 12 hours or as directed by MD, Disp: 30 patch, Rfl: 0   metoprolol  succinate (TOPROL -XL) 25 MG 24 hr tablet, Take 0.5 tablets (12.5 mg total) by mouth See admin instructions. Take 1/2 tablet (12.5mg ) by mouth once daily at noon. (Patient taking differently: Take 12.5 mg by mouth daily at 12 noon.), Disp: , Rfl:    predniSONE  (DELTASONE ) 10 MG tablet, Take 10 mg by mouth daily., Disp: , Rfl:    predniSONE  (DELTASONE ) 10 MG tablet, Take 4 tablets (40 mg total) by mouth daily with breakfast for 3 days, THEN 3 tablets (30 mg total) daily with breakfast for 3 days, THEN 2 tablets (20 mg total) daily with breakfast for 3 days, THEN 1  tablet (10 mg total) daily with breakfast for 3 days., Disp: 30 tablet, Rfl: 0   revefenacin  (YUPELRI ) 175 MCG/3ML nebulizer solution, Take 3 mLs (175 mcg total) by nebulization daily., Disp: 90 mL, Rfl: 11   terbinafine  (LAMISIL ) 1 % cream, Apply 1 Application topically 2 (two) times daily for 14 days. Apply to skin rash on feet for 2 weeks, ok to stop sooner if rash resolved., Disp: 42 g, Rfl: 5   triamcinolone  ointment (KENALOG ) 0.5 %, Apply topically 2 (two) times daily. (Patient taking differently: Apply 1 Application topically 2 (two) times daily as needed (for irritation).), Disp: 30 g, Rfl: 11   pantoprazole  (PROTONIX ) 40 MG tablet, Take 1 tablet (40 mg total) by mouth 2 (two) times daily. (Patient not taking: Reported on 11/03/2024), Disp: 60 tablet, Rfl: 0  "

## 2024-11-03 NOTE — Telephone Encounter (Signed)
 Noted er refusal appointment scheduled for today with dr.dewald

## 2024-11-03 NOTE — Patient Instructions (Signed)
 Start prednisone  taper  Start Zpak  Continue trelegy ellipta  1 puff daily - rinse mouth out after each use  Start Nebulizer Regimen when medicine arrives from [facility] - Budesonide  0.5mg  twice daily - Brovana  twice daily - Yupelri  daily  Stop trelegy inhaler once your start the nebulizer regimen  Use albuterol  inhaler or nebulizer treatment as needed  Continue dupxient injections  Follow up in 3 months, call sooner if needed

## 2024-11-03 NOTE — Telephone Encounter (Signed)
 Called [facility] and spoke with Marval about this patient's ER Refusal and symptoms   FYI Only or Action Required?: FYI only for provider: appointment scheduled on 11/03/2024 at 3:45pm with Dr [name] at Pulmonary Office.  Patient is followed in Pulmonology for COPD, last seen on 08/11/2024 by [name] Dorn NOVAK, MD.  Called Nurse Triage reporting Shortness of Breath.  Symptoms began patient states several weeks but worse lately.  Interventions attempted: Prescription medications: Prednisone , Rescue inhaler, and Nebulizer treatments.  Symptoms are: gradually worsening.  Triage Disposition: Go to ED Now (Notify PCP)  Patient/caregiver understands and will follow disposition?: No ---patient is on schedule to see pulmonologist today            Copied from CRM [id number]. Topic: Clinical - Red Word Triage >> Nov 03, 2024  1:42 PM Dedra B wrote: Red Word that prompted transfer to Nurse Triage: Patient wheezing and having difficulty breathing. Prednisone  doesn't seem to be working. Warm transfer to NT. Reason for Disposition  History of prior blood clot in leg or lungs (i.e., deep vein thrombosis, pulmonary embolism)  Answer Assessment - Initial Assessment Questions Patient on Prednisone  but states it is not helping Patient having Shortness of breath  ---used inhalers and nebulizers & still not completely helping patient ---he denies being in severe distress at this time and states it has been going on for several weeks and just getting worse ---Albuterol  Inhaler, Dupixent , Duonebs, Prednisone    COPD ---Oxygen was 97% on room air when patient checked it today and he states that this number has been fine. ---not on Oxygen at home   Patient requesting to be seen at Pulmonology Patient states that he saw his PCP today and they recommended that he followed up with his Pulmonologist and possibly be put on a taper of the Prednisone . Patient given an appointment for today 11/03/2024 at  3:45pm with Dr [name]   Given the patient's medical history of blood clots, ER was recommended and advised to the patient--Patient did not want to go to the ER at this time Spoke with Debby and advised hat the patient did not want to go to the Emergency Room and he wanted to see his Pulmonologist as he states his PCP had advised him to do earlier.  Advised Debby that patient was on the schedule for today to see Dr [name].  Patient is advised to call us  back if anything changes or with any further questions/concerns. Patient is advised that if anything worsens to go to the Emergency Room or call 911. Patient verbalized understanding.  Protocols used: Breathing Difficulty-A-AH

## 2024-11-05 ENCOUNTER — Telehealth: Payer: Self-pay | Admitting: Pulmonary Disease

## 2024-11-05 NOTE — Telephone Encounter (Signed)
 Patient is dropping off application for Disability parking placard. He would like it completed by Dr.Dewald and mailed back to him when completed. IT needs to be done before the end of the month. Form will be placed in Dr.Dewald's box for completion.

## 2024-11-05 NOTE — Telephone Encounter (Signed)
 Received paperwork -  Dr. [name] will not be back in clinic until 11/10/24

## 2024-11-11 ENCOUNTER — Telehealth: Payer: Self-pay

## 2024-11-11 NOTE — Telephone Encounter (Signed)
 Received Ohtuvayre  new start paperwork. Paperwork was already sent to [facility] on 06/16/24 and rx was triaged to [facility]. They should have reached out to pt. Sent message to pt to call [facility] and see if they can fill his Ohtuvayre .

## 2024-11-11 NOTE — Telephone Encounter (Signed)
 Patient came by and picked up form

## 2024-11-11 NOTE — Telephone Encounter (Signed)
 Form signed and ready to be pickup at front desk.   VM/LM okay per dpr

## 2024-11-12 ENCOUNTER — Other Ambulatory Visit: Payer: Self-pay | Admitting: Pulmonary Disease

## 2024-11-12 ENCOUNTER — Other Ambulatory Visit: Payer: Self-pay

## 2024-11-12 ENCOUNTER — Other Ambulatory Visit (HOSPITAL_COMMUNITY): Payer: Self-pay

## 2024-11-12 DIAGNOSIS — J4489 Other specified chronic obstructive pulmonary disease: Secondary | ICD-10-CM

## 2024-11-12 DIAGNOSIS — J4550 Severe persistent asthma, uncomplicated: Secondary | ICD-10-CM

## 2024-11-12 MED ORDER — DUPIXENT 300 MG/2ML ~~LOC~~ SOAJ
300.0000 mg | SUBCUTANEOUS | 3 refills | Status: AC
  Filled 2024-11-12 – 2024-11-25 (×4): qty 4, 28d supply, fill #0

## 2024-11-12 NOTE — Telephone Encounter (Signed)
 Refill sent for DUPIXENT  to [facility]: [phone]   Dose: 300mg  [place] every 14 days   Last OV: 11/03/24 Provider: Dr. [name]  Next OV: Due April 2026  Aleck Puls, PharmD, BCPS Clinical Pharmacist  [facility]

## 2024-11-15 ENCOUNTER — Other Ambulatory Visit: Payer: Self-pay

## 2024-11-15 ENCOUNTER — Other Ambulatory Visit (HOSPITAL_COMMUNITY): Payer: Self-pay

## 2024-11-15 NOTE — Progress Notes (Signed)
 Benefits Investigation Started  Reason: Copay is $1,508.80  Routed to: Rx Rheum/Pulm ([place])

## 2024-11-16 ENCOUNTER — Other Ambulatory Visit: Payer: Self-pay

## 2024-11-17 ENCOUNTER — Other Ambulatory Visit: Payer: Self-pay

## 2024-11-17 ENCOUNTER — Telehealth: Payer: Self-pay

## 2024-11-17 DIAGNOSIS — J4489 Other specified chronic obstructive pulmonary disease: Secondary | ICD-10-CM

## 2024-11-17 MED ORDER — PREDNISONE 10 MG PO TABS: 10.0000 mg | ORAL_TABLET | Freq: Every day | ORAL | 5 refills | Status: AC

## 2024-11-17 NOTE — Telephone Encounter (Signed)
 Copied from CRM #[id number]. Topic: Clinical - Medication Refill >> Nov 17, 2024 12:48 PM LaVerne A wrote: Medication: predniSONE  (DELTASONE ) 10 MG tablet  Has the patient contacted their pharmacy? No, no refills (Agent: If no, request that the patient contact the pharmacy for the refill. If patient does not wish to contact the pharmacy document the reason why and proceed with request.) (Agent: If yes, when and what did the pharmacy advise?)  This is the patient's preferred pharmacy:  [id number] [place], [place] - [address] AT [facility] OF [address] & [facility] [address] [place] [address] Phone: [phone] Fax: [phone]  Is this the correct pharmacy for this prescription? Yes If no, delete pharmacy and type the correct one.   Has the prescription been filled recently? Yes  Is the patient out of the medication? Yes  Has the patient been seen for an appointment in the last year OR does the patient have an upcoming appointment? Yes  Can we respond through MyChart? No  Agent: Please be advised that Rx refills may take up to 3 business days. We ask that you follow-up with your pharmacy.

## 2024-11-17 NOTE — Addendum Note (Signed)
 Addended by: SHAREN DELON HERO on: 11/17/2024 02:22 PM   Modules accepted: Orders

## 2024-11-17 NOTE — Telephone Encounter (Signed)
 10mg  prednisone   daily sent to pharmacy.  Gary Wright

## 2024-11-18 ENCOUNTER — Other Ambulatory Visit: Payer: Self-pay

## 2024-11-19 ENCOUNTER — Other Ambulatory Visit: Payer: Self-pay

## 2024-11-22 ENCOUNTER — Ambulatory Visit: Payer: Medicare Other

## 2024-11-22 ENCOUNTER — Other Ambulatory Visit (HOSPITAL_COMMUNITY): Payer: Self-pay

## 2024-11-22 ENCOUNTER — Other Ambulatory Visit: Payer: Self-pay

## 2024-11-23 ENCOUNTER — Other Ambulatory Visit (HOSPITAL_COMMUNITY): Payer: Self-pay

## 2024-11-23 ENCOUNTER — Telehealth: Payer: Self-pay

## 2024-11-23 NOTE — Progress Notes (Unsigned)
 Will attempt to apply for PAP

## 2024-11-23 NOTE — Telephone Encounter (Signed)
 Received notification via specialty pharmacy encounter that pt's copay for Dupixent  is currently $1508.80. Pt has declined M3P, advised him I will put him on waitlist for grant and also attempt to apply for PAP. Will reach back out to pt once application is sent.

## 2024-11-23 NOTE — Telephone Encounter (Signed)
 Called pt and provided him phone numbers for [facility] and [place] to try to obtain Ohtuvayre .

## 2024-11-25 ENCOUNTER — Other Ambulatory Visit (HOSPITAL_COMMUNITY): Payer: Self-pay

## 2024-11-26 ENCOUNTER — Other Ambulatory Visit (HOSPITAL_COMMUNITY): Payer: Self-pay

## 2024-11-26 ENCOUNTER — Telehealth: Payer: Self-pay

## 2024-11-26 NOTE — Telephone Encounter (Signed)
 Received referral for new start [employer] . Opening benefits investigation in this thread.

## 2024-11-26 NOTE — Telephone Encounter (Signed)
 error

## 2024-12-01 ENCOUNTER — Ambulatory Visit: Admitting: Gastroenterology

## 2024-12-08 ENCOUNTER — Ambulatory Visit
